# Patient Record
Sex: Male | Born: 1970 | Race: White | Hispanic: No | Smoking: Never smoker
Health system: Southern US, Community
[De-identification: ages and names within clinical notes are randomized; demographics above are authoritative.]

## PROBLEM LIST (undated history)

## (undated) DIAGNOSIS — I639 Cerebral infarction, unspecified: Secondary | ICD-10-CM

## (undated) DIAGNOSIS — E1142 Type 2 diabetes mellitus with diabetic polyneuropathy: Secondary | ICD-10-CM

## (undated) DIAGNOSIS — I10 Essential (primary) hypertension: Secondary | ICD-10-CM

## (undated) DIAGNOSIS — L97529 Non-pressure chronic ulcer of other part of left foot with unspecified severity: Secondary | ICD-10-CM

## (undated) DIAGNOSIS — M869 Osteomyelitis, unspecified: Secondary | ICD-10-CM

## (undated) DIAGNOSIS — I219 Acute myocardial infarction, unspecified: Secondary | ICD-10-CM

## (undated) DIAGNOSIS — I5043 Acute on chronic combined systolic (congestive) and diastolic (congestive) heart failure: Secondary | ICD-10-CM

## (undated) DIAGNOSIS — K3184 Gastroparesis: Secondary | ICD-10-CM

## (undated) DIAGNOSIS — T8781 Dehiscence of amputation stump: Secondary | ICD-10-CM

## (undated) DIAGNOSIS — Z9889 Other specified postprocedural states: Secondary | ICD-10-CM

## (undated) DIAGNOSIS — F418 Other specified anxiety disorders: Secondary | ICD-10-CM

## (undated) DIAGNOSIS — I253 Aneurysm of heart: Secondary | ICD-10-CM

## (undated) DIAGNOSIS — E559 Vitamin D deficiency, unspecified: Secondary | ICD-10-CM

## (undated) DIAGNOSIS — K219 Gastro-esophageal reflux disease without esophagitis: Secondary | ICD-10-CM

## (undated) DIAGNOSIS — M810 Age-related osteoporosis without current pathological fracture: Secondary | ICD-10-CM

## (undated) DIAGNOSIS — I2510 Atherosclerotic heart disease of native coronary artery without angina pectoris: Secondary | ICD-10-CM

## (undated) DIAGNOSIS — I739 Peripheral vascular disease, unspecified: Secondary | ICD-10-CM

## (undated) DIAGNOSIS — E11621 Type 2 diabetes mellitus with foot ulcer: Secondary | ICD-10-CM

## (undated) DIAGNOSIS — M86172 Other acute osteomyelitis, left ankle and foot: Secondary | ICD-10-CM

## (undated) DIAGNOSIS — G629 Polyneuropathy, unspecified: Secondary | ICD-10-CM

## (undated) DIAGNOSIS — S82141A Displaced bicondylar fracture of right tibia, initial encounter for closed fracture: Secondary | ICD-10-CM

## (undated) DIAGNOSIS — IMO0001 Reserved for inherently not codable concepts without codable children: Secondary | ICD-10-CM

## (undated) HISTORY — DX: Acute on chronic combined systolic (congestive) and diastolic (congestive) heart failure: I50.43

## (undated) HISTORY — DX: Atherosclerotic heart disease of native coronary artery without angina pectoris: I25.10

## (undated) HISTORY — PX: SHOULDER SURGERY: SHX246

## (undated) HISTORY — DX: Polyneuropathy, unspecified: G62.9

## (undated) HISTORY — PX: KNEE SURGERY: SHX244

## (undated) HISTORY — PX: EYE SURGERY: SHX253

## (undated) HISTORY — DX: Gastroparesis: K31.84

## (undated) HISTORY — DX: Other specified anxiety disorders: F41.8

## (undated) HISTORY — PX: CATARACT EXTRACTION: SUR2

---

## 2002-01-30 ENCOUNTER — Encounter: Admission: RE | Admit: 2002-01-30 | Discharge: 2002-01-30 | Payer: Self-pay | Admitting: Sports Medicine

## 2002-04-23 ENCOUNTER — Emergency Department (HOSPITAL_COMMUNITY): Admission: EM | Admit: 2002-04-23 | Discharge: 2002-04-23 | Payer: Self-pay | Admitting: Emergency Medicine

## 2003-05-07 ENCOUNTER — Encounter: Admission: RE | Admit: 2003-05-07 | Discharge: 2003-05-07 | Payer: Self-pay | Admitting: Family Medicine

## 2003-05-14 ENCOUNTER — Encounter: Admission: RE | Admit: 2003-05-14 | Discharge: 2003-05-14 | Payer: Self-pay | Admitting: Sports Medicine

## 2004-07-29 ENCOUNTER — Encounter (INDEPENDENT_AMBULATORY_CARE_PROVIDER_SITE_OTHER): Payer: Self-pay | Admitting: Internal Medicine

## 2004-07-29 LAB — CONVERTED CEMR LAB: Glucose, Bld: 11.7 mg/dL

## 2004-08-19 ENCOUNTER — Ambulatory Visit: Payer: Self-pay | Admitting: Internal Medicine

## 2004-09-15 ENCOUNTER — Ambulatory Visit: Payer: Self-pay | Admitting: *Deleted

## 2004-09-16 ENCOUNTER — Ambulatory Visit: Payer: Self-pay | Admitting: Internal Medicine

## 2004-10-22 ENCOUNTER — Ambulatory Visit: Payer: Self-pay | Admitting: Internal Medicine

## 2004-10-22 ENCOUNTER — Encounter (INDEPENDENT_AMBULATORY_CARE_PROVIDER_SITE_OTHER): Payer: Self-pay | Admitting: Internal Medicine

## 2004-10-22 LAB — CONVERTED CEMR LAB: Hgb A1c MFr Bld: 8.8 %

## 2006-05-31 ENCOUNTER — Emergency Department (HOSPITAL_COMMUNITY): Admission: EM | Admit: 2006-05-31 | Discharge: 2006-05-31 | Payer: Self-pay | Admitting: Family Medicine

## 2007-08-03 LAB — CONVERTED CEMR LAB
Glucose, Bld: 366 mg/dL
Hgb A1c MFr Bld: 14 %
Microalb, Ur: 2.43 mg/dL

## 2007-08-09 ENCOUNTER — Encounter (INDEPENDENT_AMBULATORY_CARE_PROVIDER_SITE_OTHER): Payer: Self-pay | Admitting: Internal Medicine

## 2007-08-09 DIAGNOSIS — E119 Type 2 diabetes mellitus without complications: Secondary | ICD-10-CM | POA: Insufficient documentation

## 2007-08-09 DIAGNOSIS — Z794 Long term (current) use of insulin: Secondary | ICD-10-CM

## 2007-08-09 DIAGNOSIS — E1143 Type 2 diabetes mellitus with diabetic autonomic (poly)neuropathy: Secondary | ICD-10-CM | POA: Insufficient documentation

## 2007-08-10 ENCOUNTER — Encounter (INDEPENDENT_AMBULATORY_CARE_PROVIDER_SITE_OTHER): Payer: Self-pay | Admitting: *Deleted

## 2007-08-10 ENCOUNTER — Ambulatory Visit: Payer: Self-pay | Admitting: Internal Medicine

## 2007-08-10 DIAGNOSIS — E785 Hyperlipidemia, unspecified: Secondary | ICD-10-CM | POA: Insufficient documentation

## 2007-08-10 LAB — CONVERTED CEMR LAB
ALT: 18 units/L (ref 0–53)
AST: 12 units/L (ref 0–37)
Albumin: 4.9 g/dL (ref 3.5–5.2)
Alkaline Phosphatase: 64 units/L (ref 39–117)
BUN: 9 mg/dL (ref 6–23)
Basophils Absolute: 0.1 10*3/uL (ref 0.0–0.1)
Basophils Relative: 1 % (ref 0–1)
CO2: 22 meq/L (ref 19–32)
Calcium: 9.5 mg/dL (ref 8.4–10.5)
Chloride: 98 meq/L (ref 96–112)
Cholesterol: 200 mg/dL (ref 0–200)
Creatinine, Ser: 0.59 mg/dL (ref 0.40–1.50)
Eosinophils Absolute: 0.1 10*3/uL (ref 0.0–0.7)
Eosinophils Relative: 1 % (ref 0–5)
Glucose, Bld: 304 mg/dL — ABNORMAL HIGH (ref 70–99)
HCT: 46.1 % (ref 39.0–52.0)
HDL: 29 mg/dL — ABNORMAL LOW (ref >39)
Hemoglobin: 16 g/dL (ref 13.0–17.0)
Lymphocytes Relative: 30 % (ref 12–46)
Lymphs Abs: 2.3 10*3/uL (ref 0.7–3.3)
MCHC: 34.7 g/dL (ref 30.0–36.0)
MCV: 87 fL (ref 78.0–100.0)
Microalb, Ur: 2.43 mg/dL — ABNORMAL HIGH (ref 0.00–1.89)
Monocytes Absolute: 0.5 10*3/uL (ref 0.2–0.7)
Monocytes Relative: 6 % (ref 3–11)
Neutro Abs: 4.9 10*3/uL (ref 1.7–7.7)
Neutrophils Relative %: 63 % (ref 43–77)
Platelets: 280 10*3/uL (ref 150–400)
Potassium: 4.2 meq/L (ref 3.5–5.3)
RBC: 5.3 M/uL (ref 4.22–5.81)
RDW: 13.4 % (ref 11.5–14.0)
Sodium: 135 meq/L (ref 135–145)
TSH: 1.524 microintl units/mL (ref 0.350–5.50)
Total Bilirubin: 1.8 mg/dL — ABNORMAL HIGH (ref 0.3–1.2)
Total CHOL/HDL Ratio: 6.9
Total Protein: 7.1 g/dL (ref 6.0–8.3)
Triglycerides: 558 mg/dL — ABNORMAL HIGH (ref <150)
WBC: 7.8 10*3/uL (ref 4.0–10.5)

## 2007-08-24 ENCOUNTER — Ambulatory Visit: Payer: Self-pay | Admitting: Internal Medicine

## 2007-08-24 DIAGNOSIS — G4700 Insomnia, unspecified: Secondary | ICD-10-CM | POA: Insufficient documentation

## 2007-08-24 LAB — CONVERTED CEMR LAB: Blood Glucose, Fingerstick: 262

## 2007-08-30 ENCOUNTER — Encounter (INDEPENDENT_AMBULATORY_CARE_PROVIDER_SITE_OTHER): Payer: Self-pay | Admitting: *Deleted

## 2008-07-08 ENCOUNTER — Telehealth (INDEPENDENT_AMBULATORY_CARE_PROVIDER_SITE_OTHER): Payer: Self-pay | Admitting: *Deleted

## 2008-07-19 ENCOUNTER — Ambulatory Visit: Payer: Self-pay | Admitting: Nurse Practitioner

## 2008-07-19 DIAGNOSIS — M542 Cervicalgia: Secondary | ICD-10-CM | POA: Insufficient documentation

## 2008-07-19 LAB — CONVERTED CEMR LAB
ALT: 19 units/L (ref 0–53)
AST: 14 units/L (ref 0–37)
Albumin: 4.8 g/dL (ref 3.5–5.2)
Alkaline Phosphatase: 75 units/L (ref 39–117)
BUN: 8 mg/dL (ref 6–23)
Basophils Absolute: 0.1 10*3/uL (ref 0.0–0.1)
Basophils Relative: 1 % (ref 0–1)
Blood Glucose, Fingerstick: 360
CO2: 22 meq/L (ref 19–32)
Calcium: 9.4 mg/dL (ref 8.4–10.5)
Chloride: 97 meq/L (ref 96–112)
Cholesterol: 194 mg/dL (ref 0–200)
Creatinine, Ser: 0.69 mg/dL (ref 0.40–1.50)
Eosinophils Absolute: 0.1 10*3/uL (ref 0.0–0.7)
Eosinophils Relative: 2 % (ref 0–5)
Glucose, Bld: 317 mg/dL — ABNORMAL HIGH (ref 70–99)
HCT: 46 % (ref 39.0–52.0)
HDL: 26 mg/dL — ABNORMAL LOW (ref >39)
Hemoglobin: 15.9 g/dL (ref 13.0–17.0)
Hgb A1c MFr Bld: 14 %
Lymphocytes Relative: 35 % (ref 12–46)
Lymphs Abs: 2.8 10*3/uL (ref 0.7–4.0)
MCHC: 34.6 g/dL (ref 30.0–36.0)
MCV: 88.1 fL (ref 78.0–100.0)
Monocytes Absolute: 0.5 10*3/uL (ref 0.1–1.0)
Monocytes Relative: 7 % (ref 3–12)
Neutro Abs: 4.5 10*3/uL (ref 1.7–7.7)
Neutrophils Relative %: 56 % (ref 43–77)
Platelets: 369 10*3/uL (ref 150–400)
Potassium: 3.9 meq/L (ref 3.5–5.3)
RBC: 5.22 M/uL (ref 4.22–5.81)
RDW: 13.2 % (ref 11.5–15.5)
Sodium: 135 meq/L (ref 135–145)
TSH: 2.694 microintl units/mL (ref 0.350–4.50)
Total Bilirubin: 1.1 mg/dL (ref 0.3–1.2)
Total CHOL/HDL Ratio: 7.5
Total Protein: 7.5 g/dL (ref 6.0–8.3)
Triglycerides: 632 mg/dL — ABNORMAL HIGH (ref <150)
WBC: 8 10*3/uL (ref 4.0–10.5)

## 2008-07-23 ENCOUNTER — Encounter (INDEPENDENT_AMBULATORY_CARE_PROVIDER_SITE_OTHER): Payer: Self-pay | Admitting: Nurse Practitioner

## 2008-07-25 ENCOUNTER — Encounter (INDEPENDENT_AMBULATORY_CARE_PROVIDER_SITE_OTHER): Payer: Self-pay | Admitting: Nurse Practitioner

## 2008-08-30 ENCOUNTER — Ambulatory Visit: Payer: Self-pay | Admitting: Nurse Practitioner

## 2008-08-30 DIAGNOSIS — E1142 Type 2 diabetes mellitus with diabetic polyneuropathy: Secondary | ICD-10-CM

## 2008-08-30 HISTORY — DX: Type 2 diabetes mellitus with diabetic polyneuropathy: E11.42

## 2008-08-30 LAB — CONVERTED CEMR LAB
Blood Glucose, Fingerstick: 290
Hgb A1c MFr Bld: 10.8 %

## 2008-12-11 ENCOUNTER — Ambulatory Visit: Payer: Self-pay | Admitting: Nurse Practitioner

## 2008-12-11 DIAGNOSIS — J309 Allergic rhinitis, unspecified: Secondary | ICD-10-CM | POA: Insufficient documentation

## 2008-12-11 LAB — CONVERTED CEMR LAB
Blood Glucose, Fingerstick: 377
Hgb A1c MFr Bld: 10.8 %

## 2008-12-12 ENCOUNTER — Encounter (INDEPENDENT_AMBULATORY_CARE_PROVIDER_SITE_OTHER): Payer: Self-pay | Admitting: Nurse Practitioner

## 2008-12-12 LAB — CONVERTED CEMR LAB
ALT: 16 units/L (ref 0–53)
AST: 13 units/L (ref 0–37)
Albumin: 4.7 g/dL (ref 3.5–5.2)
Alkaline Phosphatase: 65 units/L (ref 39–117)
Bilirubin, Direct: 0.2 mg/dL (ref 0.0–0.3)
Cholesterol: 181 mg/dL (ref 0–200)
HDL: 27 mg/dL — ABNORMAL LOW (ref >39)
Indirect Bilirubin: 1.5 mg/dL — ABNORMAL HIGH (ref 0.0–0.9)
Total Bilirubin: 1.7 mg/dL — ABNORMAL HIGH (ref 0.3–1.2)
Total Protein: 7.7 g/dL (ref 6.0–8.3)
Triglycerides: 478 mg/dL — ABNORMAL HIGH (ref <150)

## 2009-01-14 ENCOUNTER — Encounter (INDEPENDENT_AMBULATORY_CARE_PROVIDER_SITE_OTHER): Payer: Self-pay | Admitting: Nurse Practitioner

## 2009-01-15 ENCOUNTER — Encounter (INDEPENDENT_AMBULATORY_CARE_PROVIDER_SITE_OTHER): Payer: Self-pay | Admitting: Nurse Practitioner

## 2009-02-07 ENCOUNTER — Encounter (INDEPENDENT_AMBULATORY_CARE_PROVIDER_SITE_OTHER): Payer: Self-pay | Admitting: Nurse Practitioner

## 2009-02-27 ENCOUNTER — Telehealth (INDEPENDENT_AMBULATORY_CARE_PROVIDER_SITE_OTHER): Payer: Self-pay | Admitting: Nurse Practitioner

## 2009-05-14 ENCOUNTER — Ambulatory Visit: Payer: Self-pay | Admitting: Nurse Practitioner

## 2009-05-14 LAB — CONVERTED CEMR LAB
Blood Glucose, AC Bkfst: 249 mg/dL
Hgb A1c MFr Bld: 10.9 %

## 2009-05-15 LAB — CONVERTED CEMR LAB
ALT: 15 units/L (ref 0–53)
AST: 15 units/L (ref 0–37)
Albumin: 4.8 g/dL (ref 3.5–5.2)
Alkaline Phosphatase: 55 units/L (ref 39–117)
BUN: 10 mg/dL (ref 6–23)
CO2: 24 meq/L (ref 19–32)
Calcium: 9.4 mg/dL (ref 8.4–10.5)
Chloride: 100 meq/L (ref 96–112)
Cholesterol: 156 mg/dL (ref 0–200)
Creatinine, Ser: 0.63 mg/dL (ref 0.40–1.50)
Glucose, Bld: 241 mg/dL — ABNORMAL HIGH (ref 70–99)
HDL: 26 mg/dL — ABNORMAL LOW (ref >39)
LDL Cholesterol: 85 mg/dL (ref 0–99)
Potassium: 4.7 meq/L (ref 3.5–5.3)
Sodium: 136 meq/L (ref 135–145)
Total Bilirubin: 1.6 mg/dL — ABNORMAL HIGH (ref 0.3–1.2)
Total Protein: 7.6 g/dL (ref 6.0–8.3)
Triglycerides: 226 mg/dL — ABNORMAL HIGH (ref <150)
VLDL: 45 mg/dL — ABNORMAL HIGH (ref 0–40)

## 2009-05-21 DIAGNOSIS — M722 Plantar fascial fibromatosis: Secondary | ICD-10-CM | POA: Insufficient documentation

## 2009-07-11 ENCOUNTER — Ambulatory Visit: Payer: Self-pay | Admitting: Nurse Practitioner

## 2009-07-11 DIAGNOSIS — R059 Cough, unspecified: Secondary | ICD-10-CM | POA: Insufficient documentation

## 2009-07-11 DIAGNOSIS — N529 Male erectile dysfunction, unspecified: Secondary | ICD-10-CM | POA: Insufficient documentation

## 2009-07-11 DIAGNOSIS — R05 Cough: Secondary | ICD-10-CM | POA: Insufficient documentation

## 2009-07-11 LAB — CONVERTED CEMR LAB: Blood Glucose, Fingerstick: 298

## 2009-09-17 ENCOUNTER — Ambulatory Visit: Payer: Self-pay | Admitting: Nurse Practitioner

## 2009-09-17 LAB — CONVERTED CEMR LAB
BUN: 9 mg/dL (ref 6–23)
Bilirubin Urine: NEGATIVE
Blood Glucose, Fingerstick: 359
Blood in Urine, dipstick: NEGATIVE
CO2: 24 meq/L (ref 19–32)
Calcium: 9.6 mg/dL (ref 8.4–10.5)
Chloride: 94 meq/L — ABNORMAL LOW (ref 96–112)
Creatinine, Ser: 0.66 mg/dL (ref 0.40–1.50)
Glucose, Bld: 365 mg/dL — ABNORMAL HIGH (ref 70–99)
Hgb A1c MFr Bld: 10.8 %
Microalb, Ur: 1.7 mg/dL (ref 0.00–1.89)
Potassium: 3.9 meq/L (ref 3.5–5.3)
Protein, U semiquant: NEGATIVE
Rapid HIV Screen: NEGATIVE
Sodium: 134 meq/L — ABNORMAL LOW (ref 135–145)
Specific Gravity, Urine: 1.015
TSH: 2.329 microintl units/mL (ref 0.350–4.500)
Urobilinogen, UA: 0.2
WBC Urine, dipstick: NEGATIVE
pH: 5

## 2009-09-19 ENCOUNTER — Ambulatory Visit (HOSPITAL_COMMUNITY): Admission: RE | Admit: 2009-09-19 | Discharge: 2009-09-19 | Payer: Self-pay | Admitting: Internal Medicine

## 2012-03-14 ENCOUNTER — Encounter (HOSPITAL_COMMUNITY): Payer: Self-pay | Admitting: *Deleted

## 2012-03-14 ENCOUNTER — Emergency Department (INDEPENDENT_AMBULATORY_CARE_PROVIDER_SITE_OTHER)
Admission: EM | Admit: 2012-03-14 | Discharge: 2012-03-14 | Disposition: A | Discharge status: Home / Self Care | Payer: Self-pay | Source: Home / Self Care | Attending: Family Medicine | Admitting: Family Medicine

## 2012-03-14 DIAGNOSIS — H109 Unspecified conjunctivitis: Secondary | ICD-10-CM

## 2012-03-14 LAB — GLUCOSE, CAPILLARY: Glucose-Capillary: 442 mg/dL — ABNORMAL HIGH (ref 70–99)

## 2012-03-14 MED ORDER — POLYMYXIN B-TRIMETHOPRIM 10000-0.1 UNIT/ML-% OP SOLN: 1.0000 [drp] | OPHTHALMIC | Status: AC

## 2012-03-14 NOTE — Discharge Instructions (Signed)
Use eyedrops as directed. May also use normal saline prior to antibiotic drops to flush eyes. Exercise proper hygiene with handwashing, not touching the face or eye, not sharing towels or other clothing. Return to care should your symptoms not improve, or worsen in any way, or any visual disturbance.

## 2012-03-14 NOTE — ED Provider Notes (Signed)
History     CSN: [id number]  Arrival date & time 03/14/12  1252   First MD Initiated Contact with Patient 03/14/12 1450      Chief Complaint  Patient presents with  . Conjunctivitis    (Consider location/radiation/quality/duration/timing/severity/associated sxs/prior treatment) HPI Comments: Travis [name] presents for evaluation of persistent, redness, tearing, blurry vision, in his right eye. He reports onset of symptoms last Wednesday. He denies any specific injury, and does not work, around any degree. That day. He reports, that he had been using some eyedrops given to him by his father with mild improvement in his symptoms. However, over the last 3 days. Symptoms have worsened. He denies any loss of vision.  Patient is a 41 y.o. male presenting with conjunctivitis. The history is provided by the patient.  Conjunctivitis  The current episode started 5 to 7 days ago. The problem has been unchanged. The symptoms are relieved by one or more OTC medications. The symptoms are aggravated by nothing. Associated symptoms include photophobia, eye discharge and eye redness. Pertinent negatives include no decreased vision, no double vision, no eye itching, no congestion and no eye pain. There is pain in the right eye. The eye pain is not associated with movement. The eyelid exhibits redness.    Past Medical History  Diagnosis Date  . Diabetes mellitus     Past Surgical History  Procedure Date  . Knee surgery   . Shoulder surgery     No family history on file.  History  Substance Use Topics  . Smoking status: Never Smoker   . Smokeless tobacco: Not on file  . Alcohol Use: No      Review of Systems  Constitutional: Negative.   HENT: Negative.  Negative for congestion.   Eyes: Positive for photophobia, discharge and redness. Negative for double vision, pain and itching.  Respiratory: Negative.   Cardiovascular: Negative.   Gastrointestinal: Negative.   Genitourinary: Negative.     Musculoskeletal: Negative.   Skin: Negative.   Neurological: Negative.     Allergies  Review of patient's allergies indicates no known allergies.  Home Medications   Current Outpatient Rx  Name Route Sig Dispense Refill  . INSULIN ASPART 100 UNIT/ML [place] SOLN Subcutaneous Inject 10 Units into the skin 3 (three) times daily before meals.    . INSULIN DETEMIR 100 UNIT/ML [place] SOLN Subcutaneous Inject 60 Units into the skin 2 (two) times daily.    Marland Kitchen METFORMIN HCL ER (MOD) 1000 MG PO TB24 Oral Take 1,000 mg by mouth 2 (two) times daily.    Marland Kitchen POLYMYXIN B-TRIMETHOPRIM 10000-0.1 UNIT/ML-% OP SOLN Both Eyes Place 1 drop into both eyes every 4 (four) hours. 10 mL 0    BP 114/66  Pulse 87  Temp(Src) 97.6 F (36.4 C) (Oral)  Resp 14  SpO2 98%  Physical Exam  Nursing note and vitals reviewed. Constitutional: He is oriented to person, place, and time. He appears well-developed and well-nourished.  HENT:  Head: Normocephalic and atraumatic.  Eyes: EOM and lids are normal. Pupils are equal, round, and reactive to light. Right conjunctiva is injected. Right conjunctiva has no hemorrhage. Left conjunctiva is not injected. Left conjunctiva has no hemorrhage.  Neck: Normal range of motion.  Pulmonary/Chest: Effort normal.  Musculoskeletal: Normal range of motion.  Neurological: He is alert and oriented to person, place, and time.  Skin: Skin is warm and dry.  Psychiatric: His behavior is normal.    ED Course  Procedures (including critical care time)  Labs Reviewed - No data to display No results found.   1. Conjunctivitis       MDM  Likely viral conjunctivitis given length of time, but will cover with Polytrim eye drops; checked CBG, secondary to being out of insulin for more than one month; CBG: 442; he denies any symptoms and reports that this is his "normal" when he is out of his medicine; he states that he cannot afford his medicine, as Work[employer] Compensation was covering his  supplies but just recently cut it off; he states that his PCP at [facility] is working on it        Renaee Munda, MD 03/14/12 1609

## 2012-03-14 NOTE — ED Notes (Signed)
Pt states he started with right eye redness and irritation on Friday.  Used some of his dad's eye drops (not sure of name of med) and it got better.  Stopped the drops and the redness and irritation returned.  Pt has been out of his diabetes meds for over a month.

## 2012-05-22 ENCOUNTER — Encounter (HOSPITAL_COMMUNITY): Payer: Self-pay | Admitting: Emergency Medicine

## 2012-05-22 ENCOUNTER — Emergency Department (HOSPITAL_COMMUNITY)
Admission: EM | Admit: 2012-05-22 | Discharge: 2012-05-22 | Disposition: A | Discharge status: Home / Self Care | Payer: Self-pay | Attending: Emergency Medicine | Admitting: Emergency Medicine

## 2012-05-22 DIAGNOSIS — H209 Unspecified iridocyclitis: Secondary | ICD-10-CM | POA: Insufficient documentation

## 2012-05-22 DIAGNOSIS — E119 Type 2 diabetes mellitus without complications: Secondary | ICD-10-CM | POA: Insufficient documentation

## 2012-05-22 MED ORDER — OXYCODONE-ACETAMINOPHEN 5-325 MG PO TABS
1.0000 | ORAL_TABLET | Freq: Once | ORAL | Status: AC
  Administered 2012-05-22: 1 via ORAL
  Filled 2012-05-22: qty 1

## 2012-05-22 MED ORDER — CYCLOPENTOLATE HCL 1 % OP SOLN
2.0000 [drp] | Freq: Once | OPHTHALMIC | Status: AC
  Administered 2012-05-22: 2 [drp] via OPHTHALMIC
  Filled 2012-05-22: qty 2

## 2012-05-22 MED ORDER — OXYCODONE-ACETAMINOPHEN 5-325 MG PO TABS: 2.0000 | ORAL_TABLET | ORAL | Status: AC | PRN

## 2012-05-22 NOTE — ED Provider Notes (Signed)
History     CSN: [id number]  Arrival date & time 05/22/12  1701   First MD Initiated Contact with Patient 05/22/12 1748      Chief Complaint  Patient presents with  . Eye Injury    (Consider location/radiation/quality/duration/timing/severity/associated sxs/prior treatment) HPI  Past Medical History  Diagnosis Date  . Diabetes mellitus     Past Surgical History  Procedure Date  . Knee surgery   . Shoulder surgery     History reviewed. No pertinent family history.  History  Substance Use Topics  . Smoking status: Never Smoker   . Smokeless tobacco: Not on file  . Alcohol Use: No      Review of Systems  All other systems reviewed and are negative.    Allergies  Review of patient's allergies indicates no known allergies.  Home Medications   Current Outpatient Rx  Name Route Sig Dispense Refill  . OXYCODONE-ACETAMINOPHEN 5-325 MG PO TABS Oral Take 2 tablets by mouth every 4 (four) hours as needed for pain. 6 tablet 0    BP 132/80  Pulse 90  Temp(Src) 98 F (36.7 C) (Oral)  Resp 14  SpO2 99%  Physical Exam  Nursing note and vitals reviewed. Constitutional: He is oriented to person, place, and time. He appears well-developed and well-nourished. No distress.  HENT:  Head: Normocephalic and atraumatic.  Eyes: No foreign bodies found. Left eye exhibits no exudate. No foreign body present in the left eye. Left conjunctiva is injected. Left eye exhibits normal extraocular motion and no nystagmus. Left pupil is not round.  Slit lamp exam:      The left eye shows corneal flare. The left eye shows no corneal abrasion, no corneal ulcer, no foreign body, no hyphema, no hypopyon, no fluorescein uptake and no anterior chamber bulge.  Neck: Normal range of motion.  Cardiovascular: Normal rate and intact distal pulses.   Pulmonary/Chest: No respiratory distress.  Abdominal: Normal appearance. He exhibits no distension.  Musculoskeletal: Normal range of motion.    Neurological: He is alert and oriented to person, place, and time. No cranial nerve deficit.  Skin: Skin is warm and dry. No rash noted.  Psychiatric: He has a normal mood and affect. His behavior is normal.    ED Course  Procedures (including critical care time) IOP was 12 in L eye Labs Reviewed - No data to display No results found.   1. Iritis       MDM          Nelia Shi, MD 05/22/12 2039

## 2012-05-22 NOTE — ED Notes (Addendum)
Pt discharged.Discharge instruction given to pt.

## 2012-05-22 NOTE — ED Notes (Signed)
Pt c/o left eye redness and pain x 1 week; pt sts hx of similar in right eye recently; pt denies obvious injury; eye is noted to be red and light sensitive

## 2012-05-22 NOTE — ED Notes (Signed)
Pt is alert male with him

## 2012-05-22 NOTE — Discharge Instructions (Signed)
Iritis Iritis is an inflammation of the colored part of the eye (iris). Other parts at the front of the eye may also be inflamed. The iris is part of the middle layer of the eyeball which is called the uvea or the uveal track. Any part of the uveal track can become inflamed. The other portions of the uveal track are the choroid (the thin membrane under the outer layer of the eye), and the ciliary body (joins the choroid and the iris and produces the fluid in the front of the eye).  It is extremely important to treat iritis early, as it may lead to internal eye damage causing scarring or diseases such as glaucoma. Some people have only one attack of iritis (in one or both eyes) in their lifetime, while others may get it many times. CAUSES Iritis can be associated with many different diseases, but mostly occurs in otherwise healthy people. Examples of diseases that can be associated with iritis include:  Diseases where the body's immune system attacks tissues within your own body (autoimmune diseases).   Infections (tuberculosis, gonorrhea, fungus infections, Lyme disease, infection of the lining of the heart).   Trauma or injury.   Eye diseases (acute glaucoma and others).   Inflammation from other parts of the uveal track.   Severe eye infections.   Other rare diseases.  SYMPTOMS  Eye pain or aching.   Sensitivity to light.   Loss of sight or blurred vision.   Redness of the eye. This is often accompanied by a ring of redness around the outside of the cornea, or clear covering at the front of the eye (ciliary flush).   Excessive tearing of the eye(s).   A small pupil that does not enlarge in the dark and stays smaller than the other eye's pupil.   A whitish area that obscures the lower part of the colored circular iris. Sometimes this is visible when looking at the eye, where the whitish area has a "fluid level" or flat top. This is called a "hypopyon" and is actually pus inside the  eye.  Since iritis causes the eye to become red, it is often confused with a much less dangerous form of "pink eye" or conjunctivitis. One of the most important symptoms is sensitivity to light. Anytime there is redness, discomfort in the eye(s) and extreme light sensitivity, it is extremely important to see an ophthalmologist as soon as possible. TREATMENT Acute iritis requires prompt medical evaluation by an eye specialist (ophthalmologist.) Treatment depends on the underlying cause but may include:  Corticosteroid eye drops and dilating eye drops. Follow your caregiver's exact instructions on taking and stopping corticosteroid medications (drops or pills).   Occasionally, the iritis will be so severe that it will not respond to commonly used medications. If this happens, it may be necessary to use steroid injections. The injections are given under the eye's outer surface. Sometimes oral medications are given. The decision on treatment used for iritis is usually made on an individual basis.  HOME CARE INSTRUCTIONS Your care giver will give specific instructions regarding the use of eye medications or other medications. Be certain to follow all instructions in both taking and stopping the medications. SEEK IMMEDIATE MEDICAL CARE IF:  You have redness of one or both eye.   You experience a great deal of light sensitivity.   You have pain or aching in either eye.  MAKE SURE YOU:   Understand these instructions.   Will watch your condition.     Will get help right away if you are not doing well or get worse.  Document Released: 11/29/2005 Document Revised: 11/18/2011 Document Reviewed: 05/19/2007 Exit[employer] Patient Information 2012 [employer]. 

## 2012-05-22 NOTE — ED Notes (Signed)
Paged Dr. Geralynn Ochs to [phone]

## 2013-05-25 ENCOUNTER — Encounter (HOSPITAL_COMMUNITY): Payer: Self-pay

## 2013-05-25 ENCOUNTER — Emergency Department (HOSPITAL_COMMUNITY)
Admission: EM | Admit: 2013-05-25 | Discharge: 2013-05-25 | Disposition: A | Discharge status: Home / Self Care | Payer: Self-pay | Attending: Emergency Medicine | Admitting: Emergency Medicine

## 2013-05-25 DIAGNOSIS — H539 Unspecified visual disturbance: Secondary | ICD-10-CM | POA: Insufficient documentation

## 2013-05-25 DIAGNOSIS — H5789 Other specified disorders of eye and adnexa: Secondary | ICD-10-CM | POA: Insufficient documentation

## 2013-05-25 DIAGNOSIS — Z79899 Other long term (current) drug therapy: Secondary | ICD-10-CM | POA: Insufficient documentation

## 2013-05-25 DIAGNOSIS — H5711 Ocular pain, right eye: Secondary | ICD-10-CM

## 2013-05-25 DIAGNOSIS — H53149 Visual discomfort, unspecified: Secondary | ICD-10-CM | POA: Insufficient documentation

## 2013-05-25 DIAGNOSIS — E119 Type 2 diabetes mellitus without complications: Secondary | ICD-10-CM | POA: Insufficient documentation

## 2013-05-25 DIAGNOSIS — H5710 Ocular pain, unspecified eye: Secondary | ICD-10-CM | POA: Insufficient documentation

## 2013-05-25 MED ORDER — TETRACAINE HCL 0.5 % OP SOLN
1.0000 [drp] | Freq: Once | OPHTHALMIC | Status: AC
  Administered 2013-05-25: 1 [drp] via OPHTHALMIC
  Filled 2013-05-25: qty 2

## 2013-05-25 NOTE — ED Provider Notes (Signed)
History     CSN: [id number]  Arrival date & time 05/25/13  1210   First MD Initiated Contact with Patient 05/25/13 1245      No chief complaint on file.   (Consider location/radiation/quality/duration/timing/severity/associated sxs/prior treatment) HPI Comments: Patient presents to the ED for right eye redness and visual disturbance.  States he's been having this problem intermittently for the past year but has not seen an eye doctor.  Pt does wear glasses when reading.  Patient states last night he experienced a sudden loss of vision which has never happened with prior episodes. Today in the ED he states vision has improved somewhat but he feels like there is a film over his right eye and everything in his right visual field appears white.  Denies any injury or trauma to the eye.  No chemical or FB exposure.  Pt wearing right eye patch on arrival.  The history is provided by the patient.    Past Medical History  Diagnosis Date  . Diabetes mellitus     Past Surgical History  Procedure Laterality Date  . Knee surgery    . Shoulder surgery      History reviewed. No pertinent family history.  History  Substance Use Topics  . Smoking status: Never Smoker   . Smokeless tobacco: Not on file  . Alcohol Use: No      Review of Systems  Eyes: Positive for photophobia, pain, redness and visual disturbance.  All other systems reviewed and are negative.    Allergies  Review of patient's allergies indicates no known allergies.  Home Medications   Current Outpatient Rx  Name  Route  Sig  Dispense  Refill  . beta carotene w/minerals (OCUVITE) tablet   Oral   Take 1 tablet by mouth daily.           BP 123/82  Pulse 93  Temp(Src) 98.2 F (36.8 C) (Oral)  Resp 18  SpO2 98%  Physical Exam  Nursing note and vitals reviewed. Constitutional: He is oriented to person, place, and time. He appears well-developed and well-nourished.  HENT:  Head: Normocephalic and  atraumatic.  Mouth/Throat: Oropharynx is clear and moist.  Eyes: EOM and lids are normal. Pupils are equal, round, and reactive to light. Right conjunctiva is injected. Right conjunctiva has no hemorrhage. Right eye exhibits normal extraocular motion and no nystagmus.  Right eye injected without haziness, FB, drainage, or lid edema, EOM intact and non-painful  Neck: Normal range of motion. Neck supple.  Cardiovascular: Normal rate, regular rhythm and normal heart sounds.   Pulmonary/Chest: Effort normal and breath sounds normal.  Abdominal: Soft. Bowel sounds are normal.  Musculoskeletal: Normal range of motion.  Neurological: He is alert and oriented to person, place, and time.  Skin: Skin is warm and dry.  Psychiatric: He has a normal mood and affect.    ED Course  Procedures (including critical care time)  Labs Reviewed - No data to display No results found.   1. Acute right eye pain   2. Redness of eye, right   3. Visual disturbance       MDM   Visual acuity L 20/30, unable to visual chart with right eye.  Attempted to obtain IOP and examine eye with slit lamp but pt could not tolerate either evaluation.  Consulted opthalmology, Dr. Charlotte Sanes- instructed to have pt report directly to his office for further evaluation.  Discussed plan with pt and wife, they agreed.  Garlon Hatchet, PA-C 05/25/13  1651 

## 2013-05-25 NOTE — ED Notes (Signed)
Pt presents with 1 year h/o eye difficulties.  Pt reports initially was to L eye, describes as "really sore" with photophobia.  Symptoms cleared, reports last 3 episodes have been to R eye.  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  Pt reports loss of vision last night, seeing initially a film covering his eye and reports now, his entire visual field is white.

## 2013-05-26 NOTE — ED Provider Notes (Signed)
Medical screening examination/treatment/procedure(s) were performed by non-physician practitioner and as supervising physician I was immediately available for consultation/collaboration.   Zanya Lindo W Florella Mcneese, MD 05/26/13 0807 

## 2014-03-18 ENCOUNTER — Ambulatory Visit (INDEPENDENT_AMBULATORY_CARE_PROVIDER_SITE_OTHER): Payer: BC Managed Care – PPO | Admitting: Family Medicine

## 2014-03-18 ENCOUNTER — Encounter: Payer: Self-pay | Admitting: Family Medicine

## 2014-03-18 VITALS — BP 104/66 | HR 68 | Temp 97.3°F | Resp 14 | Ht 74.0 in | Wt 225.0 lb

## 2014-03-18 DIAGNOSIS — G629 Polyneuropathy, unspecified: Secondary | ICD-10-CM | POA: Insufficient documentation

## 2014-03-18 DIAGNOSIS — E1149 Type 2 diabetes mellitus with other diabetic neurological complication: Secondary | ICD-10-CM

## 2014-03-18 MED ORDER — ONETOUCH ULTRA SYSTEM W/DEVICE KIT: 1.0000 | PACK | Freq: Once | Status: DC

## 2014-03-18 NOTE — Progress Notes (Signed)
   Subjective:    Patient ID: Travis [name], male    DOB: 09/18/1971, 43 y.o.   MRN: [id number]  HPI Patient is a 43 year old white male who is a previous patient of mine who have not seen in several years. He has a history of poorly controlled/uncontrolled type 2 diabetes mellitus. Last hemoglobin A1c I have on record was 11 in 2010. The patient is not checking his sugars at all. He states that over a year ago her sugars are running in the 400s when he was not on medication. In the past he has been on 70 units of Lantus and 10 units of NovoLog 3 times a day. On that dose he was typically in the 200s although still not well controlled. He has severe peripheral neuropathy in both feet as well as both arms. He has essentially no sensation in the plantar aspects of both feet. He also has a history of diabetic retinopathy. He has seen an eye specialist this year. He is here today to reestablish care. Past Medical History  Diagnosis Date  . Diabetes mellitus   . Peripheral neuropathy    No current outpatient prescriptions on file prior to visit.   No current facility-administered medications on file prior to visit.   No Known Allergies History   Social History  . Marital Status: Legally Separated    Spouse Name: N/A    Number of Children: N/A  . Years of Education: N/A   Occupational History  . Not on file.   Social History Main Topics  . Smoking status: Never Smoker   . Smokeless tobacco: Never Used  . Alcohol Use: No  . Drug Use: No  . Sexual Activity: Yes   Other Topics Concern  . Not on file   Social History Narrative  . No narrative on file      Review of Systems  All other systems reviewed and are negative.       Objective:   Physical Exam  Vitals reviewed. Neck: Neck supple. No thyromegaly present.  Cardiovascular: Normal rate, regular rhythm, normal heart sounds and intact distal pulses.  Exam reveals no gallop and no friction rub.   No murmur  heard. Pulmonary/Chest: Effort normal and breath sounds normal. No respiratory distress. He has no wheezes. He has no rales. He exhibits no tenderness.  Abdominal: Soft. Bowel sounds are normal. He exhibits no distension. There is no tenderness. There is no rebound and no guarding.  Musculoskeletal: He exhibits no edema.  Lymphadenopathy:    He has no cervical adenopathy.          Assessment & Plan:  1. Type II or unspecified type diabetes mellitus with neurological manifestations, not stated as uncontrolled Check a hemoglobin A1c as well as a urine microalbumin. Based on this I can come up with an estimate of how much insulin the patient needs. I anticipate start the patient on Lantus 60 units subcutaneous a.m. and 10 units of NovoLog with meals. Then recheck his fasting blood sugar and two-hour postprandial sugars in one week to further titrate his medication. The patient has severe peripheral neuropathy in his feet. We discussed measures to prevent ulcers and infections in the feet. I recommended annual followup with his eye specialist. Are the results of his lab work before determining the next course of treatment. - COMPLETE METABOLIC PANEL WITH GFR - Hemoglobin A1c - Microalbumin, urine

## 2014-03-19 LAB — COMPLETE METABOLIC PANEL WITHOUT GFR
ALT: 16 U/L (ref 0–53)
AST: 14 U/L (ref 0–37)
Albumin: 4.4 g/dL (ref 3.5–5.2)
Alkaline Phosphatase: 73 U/L (ref 39–117)
BUN: 6 mg/dL (ref 6–23)
CO2: 27 meq/L (ref 19–32)
Calcium: 8.8 mg/dL (ref 8.4–10.5)
Chloride: 96 meq/L (ref 96–112)
Creat: 0.54 mg/dL (ref 0.50–1.35)
GFR, Est African American: >89 mL/min
GFR, Est Non African American: >89 mL/min
Glucose, Bld: 349 mg/dL — ABNORMAL HIGH (ref 70–99)
Potassium: 4.2 meq/L (ref 3.5–5.3)
Sodium: 132 meq/L — ABNORMAL LOW (ref 135–145)
Total Bilirubin: 1 mg/dL (ref 0.2–1.2)
Total Protein: 6.8 g/dL (ref 6.0–8.3)

## 2014-03-19 LAB — HEMOGLOBIN A1C
Hgb A1c MFr Bld: 11.6 % — ABNORMAL HIGH
Mean Plasma Glucose: 286 mg/dL — ABNORMAL HIGH

## 2014-03-19 LAB — MICROALBUMIN, URINE: Microalb, Ur: 0.5 mg/dL (ref 0.00–1.89)

## 2014-03-21 ENCOUNTER — Other Ambulatory Visit: Payer: Self-pay | Admitting: *Deleted

## 2014-03-21 MED ORDER — INSULIN ASPART 100 UNIT/ML ~~LOC~~ SOLN: 7.0000 [IU] | Freq: Every evening | SUBCUTANEOUS | Status: DC

## 2014-03-21 MED ORDER — INSULIN GLARGINE 100 UNIT/ML ~~LOC~~ SOLN: 45.0000 [IU] | Freq: Every morning | SUBCUTANEOUS | Status: DC

## 2014-03-21 NOTE — Telephone Encounter (Signed)
Per orders noted on labs, medication sent to pharmacy.   Call placed to patient and patient made aware. 

## 2014-03-29 ENCOUNTER — Encounter: Payer: Self-pay | Admitting: Family Medicine

## 2014-04-08 ENCOUNTER — Telehealth: Payer: Self-pay | Admitting: Family Medicine

## 2014-04-08 NOTE — Telephone Encounter (Signed)
Message copied by Ricard DillonWILLIS, SANDY B on Mon Apr 08, 2014  3:16 PM ------      Message from: Harriet MassonOBERTS, CHELSEA N      Created: Mon Apr 08, 2014  2:06 PM      Regarding: sugar levels      Contact: [phone]       Sugar levels            Morning fast getting better        [phone] is what it is ranging from            Hour after dinner      [phone] ------

## 2014-04-09 NOTE — Telephone Encounter (Signed)
Increase lantus with 60 units day and recheck levels in 1 week.

## 2014-04-11 NOTE — Telephone Encounter (Signed)
Patient aware and will call back with results in a week

## 2014-04-11 NOTE — Telephone Encounter (Signed)
LMTRC

## 2014-04-29 ENCOUNTER — Ambulatory Visit: Payer: BC Managed Care – PPO | Admitting: Family Medicine

## 2014-04-29 ENCOUNTER — Telehealth: Payer: Self-pay | Admitting: Family Medicine

## 2014-04-29 NOTE — Telephone Encounter (Signed)
Message copied by Ricard DillonWILLIS, SANDY B on Mon Apr 29, 2014  3:15 PM ------      Message from: Lynnea FerrierPICKARD, WARREN      Created: Fri Apr 26, 2014  7:51 AM       Increase lantus to 75 units daily and continue novolog 10 with meals.  Recheck in 1 week.      ----- Message -----         From: Ricard DillonSandy B Willis         Sent: 04/25/2014   3:45 PM           To: Donita BrooksWarren T Pickard, MD                        ----- Message -----         From: Malvin JohnsSusan S Bullins         Sent: 04/25/2014   3:05 PM           To: Ricard DillonSandy B Willis            Patient is calling to give you his blood sugar readings       286 down to 225 fasting.      After dinner [phone]       ------

## 2014-04-29 NOTE — Telephone Encounter (Signed)
Pt aware and is only on 7 units of novolog - per WTP continue that dose and increase just the lantus to 75 units and call in 1 week with BS.

## 2014-04-30 ENCOUNTER — Telehealth: Payer: Self-pay | Admitting: Family Medicine

## 2014-04-30 MED ORDER — INSULIN GLARGINE 100 UNIT/ML ~~LOC~~ SOLN: 75.0000 [IU] | Freq: Every morning | SUBCUTANEOUS | Status: DC

## 2014-04-30 NOTE — Telephone Encounter (Signed)
Med sent to pharm 

## 2014-04-30 NOTE — Telephone Encounter (Signed)
[phone] Pt is needing a refill on  insulin glargine (LANTUS) 100 UNIT/ML injection With new instructions  [facility] [place]

## 2014-05-03 ENCOUNTER — Encounter: Payer: Self-pay | Admitting: Family Medicine

## 2014-05-03 ENCOUNTER — Ambulatory Visit (INDEPENDENT_AMBULATORY_CARE_PROVIDER_SITE_OTHER): Payer: BC Managed Care – PPO | Admitting: Family Medicine

## 2014-05-03 VITALS — BP 100/60 | HR 74 | Temp 97.7°F | Resp 14 | Ht 74.0 in | Wt 238.0 lb

## 2014-05-03 DIAGNOSIS — R5383 Other fatigue: Secondary | ICD-10-CM

## 2014-05-03 DIAGNOSIS — E1142 Type 2 diabetes mellitus with diabetic polyneuropathy: Secondary | ICD-10-CM

## 2014-05-03 DIAGNOSIS — E1149 Type 2 diabetes mellitus with other diabetic neurological complication: Secondary | ICD-10-CM

## 2014-05-03 DIAGNOSIS — R5381 Other malaise: Secondary | ICD-10-CM

## 2014-05-03 DIAGNOSIS — E1140 Type 2 diabetes mellitus with diabetic neuropathy, unspecified: Secondary | ICD-10-CM

## 2014-05-03 LAB — COMPLETE METABOLIC PANEL WITH GFR
ALT: 15 U/L (ref 0–53)
AST: 13 U/L (ref 0–37)
Albumin: 4.3 g/dL (ref 3.5–5.2)
Alkaline Phosphatase: 61 U/L (ref 39–117)
BUN: 7 mg/dL (ref 6–23)
CO2: 25 mEq/L (ref 19–32)
CREATININE: 0.6 mg/dL (ref 0.50–1.35)
Calcium: 9.2 mg/dL (ref 8.4–10.5)
Chloride: 100 mEq/L (ref 96–112)
GFR, Est African American: >89 mL/min
GFR, Est Non African American: >89 mL/min
Glucose, Bld: 285 mg/dL — ABNORMAL HIGH (ref 70–99)
POTASSIUM: 4.7 meq/L (ref 3.5–5.3)
SODIUM: 134 meq/L — AB (ref 135–145)
Total Bilirubin: 0.9 mg/dL (ref 0.2–1.2)
Total Protein: 6.5 g/dL (ref 6.0–8.3)

## 2014-05-03 LAB — VITAMIN B12: Vitamin B-12: 312 pg/mL (ref 211–911)

## 2014-05-03 LAB — TSH: TSH: 2.757 u[IU]/mL (ref 0.350–4.500)

## 2014-05-03 LAB — TESTOSTERONE: Testosterone: 306 ng/dL (ref 300–890)

## 2014-05-03 MED ORDER — DULOXETINE HCL 30 MG PO CPEP: 60.0000 mg | ORAL_CAPSULE | Freq: Every day | ORAL | Status: DC

## 2014-05-03 NOTE — Progress Notes (Signed)
Subjective:    Patient ID: Travis [name], male    DOB: [birth year], 43 y.o.   MRN: [id number]  HPI  Patient is currently on 75 units of Lantus subcutaneous daily and 7 units of NovoLog with supper. He reports blood sugars between 202 150. He is also having severe neuropathy in his feet on a daily basis. He describes it as a pins and needles sensation. He is also concerned because he is extremely fatigued and tired. He has no motivation exercise. He has noticed muscle wasting in the proximal muscles of his legs and arms. He has noticed decreased strength. He is concerned he may have testosterone deficiency. At the present time he is not getting any regular aerobic exercise. He is also still drinking sodas and eating high carbohydrate. Past Medical History  Diagnosis Date  . Diabetes mellitus   . Peripheral neuropathy    Current Outpatient Prescriptions on File Prior to Visit  Medication Sig Dispense Refill  . Blood Glucose Monitoring Suppl (ONE TOUCH ULTRA SYSTEM KIT) W/DEVICE KIT 1 kit by Does not apply route once. Needs strips Disp: 100/11 refills and lancets Disp: 100/5 refills  Pt checks BS QAM.  1 each  0  . glucose blood test strip 1 each by Other route as needed for other. One Touch Ultra - Checks BS QAM      . insulin glargine (LANTUS) 100 UNIT/ML injection Inject 0.75 mLs (75 Units total) into the skin every morning.  20 mL  3   No current facility-administered medications on file prior to visit.   No Known Allergies History   Social History  . Marital Status: Legally Separated    Spouse Name: N/A    Number of Children: N/A  . Years of Education: N/A   Occupational History  . Not on file.   Social History Main Topics  . Smoking status: Never Smoker   . Smokeless tobacco: Never Used  . Alcohol Use: No  . Drug Use: No  . Sexual Activity: Yes   Other Topics Concern  . Not on file   Social History Narrative  . No narrative on file   History reviewed. No pertinent  family history.   Review of Systems  All other systems reviewed and are negative.      Objective:   Physical Exam  Vitals reviewed. Constitutional: He appears well-developed and well-nourished.  Cardiovascular: Normal rate, regular rhythm and normal heart sounds.   Pulmonary/Chest: Effort normal and breath sounds normal. No respiratory distress. He has no wheezes. He has no rales.  Abdominal: Soft. Bowel sounds are normal. He exhibits no distension. There is no tenderness. There is no rebound and no guarding.  Neurological: He is alert. He has normal reflexes.          Assessment & Plan:  1. Diabetic neuropathy Begin Cymbalta 30 mg by mouth daily for one-week and then gradually increase to 60 mg by mouth daily thereafter. I also explained that we need to increase his rising and heart were to prevent worsening of the neuropathy. I discussed with the patient to begin 30 minutes aerobic exercise 5 days a week. This should reduce his sugar readings. I also recommended that he drastically reduced carbohydrates in his diet. I recommended left and 45 g of carbohydrates per meal. I explained to the patient that one typical ca of soda has greater than 50 g of carbs in it. He can stop drinking sodas it would dramatically improve his blood sugar.  Recheck sugars in 1 week. - DULoxetine (CYMBALTA) 30 MG capsule; Take 2 capsules (60 mg total) by mouth daily.  Dispense: 60 capsule; Refill: 3 - Vitamin B12  2. Other malaise and fatigue - COMPLETE METABOLIC PANEL WITH GFR - TSH - Testosterone - Vitamin B12

## 2014-05-08 ENCOUNTER — Telehealth: Payer: Self-pay | Admitting: Family Medicine

## 2014-05-08 DIAGNOSIS — G4710 Hypersomnia, unspecified: Secondary | ICD-10-CM

## 2014-05-08 NOTE — Telephone Encounter (Signed)
Pt states that you were going to discuss his low BP but at the ov you and him got to discussing something else and nothing was mentioned about it. He is still feeling sluggish and tired.

## 2014-05-13 NOTE — Telephone Encounter (Signed)
Pt would like to get a sleep study done as he is having problems waking up at night trying to catch his breath. Will send in order.

## 2014-05-13 NOTE — Telephone Encounter (Signed)
I do not feel his bp is the cause of his sluggishness and fatigue.  Other potential causes include sleep apnea, he should consider a sleep study.  If he is having sob or chest pain, I would recommend cardiology eval.

## 2014-06-22 ENCOUNTER — Other Ambulatory Visit: Payer: Self-pay | Admitting: *Deleted

## 2014-06-22 ENCOUNTER — Telehealth: Payer: Self-pay | Admitting: Family Medicine

## 2014-06-22 DIAGNOSIS — E1141 Type 2 diabetes mellitus with diabetic mononeuropathy: Secondary | ICD-10-CM

## 2014-06-22 DIAGNOSIS — E785 Hyperlipidemia, unspecified: Secondary | ICD-10-CM

## 2014-06-22 DIAGNOSIS — E1149 Type 2 diabetes mellitus with other diabetic neurological complication: Secondary | ICD-10-CM

## 2014-06-22 NOTE — Telephone Encounter (Signed)
Several calls have been made to patient to schedule a sleep evaluation.  Pt has not responded to phone calls nor messages left requesting a call back.  Will close this referral until otherwise notified.

## 2014-09-02 ENCOUNTER — Ambulatory Visit: Payer: BC Managed Care – PPO | Admitting: Family Medicine

## 2015-09-05 ENCOUNTER — Ambulatory Visit: Payer: Self-pay | Attending: Internal Medicine

## 2015-11-03 ENCOUNTER — Encounter: Payer: Self-pay | Admitting: Family Medicine

## 2015-11-03 ENCOUNTER — Ambulatory Visit (INDEPENDENT_AMBULATORY_CARE_PROVIDER_SITE_OTHER): Payer: No Typology Code available for payment source | Admitting: Family Medicine

## 2015-11-03 VITALS — BP 121/70 | HR 83 | Temp 98.3°F | Resp 14 | Ht 74.0 in | Wt 197.0 lb

## 2015-11-03 DIAGNOSIS — K219 Gastro-esophageal reflux disease without esophagitis: Secondary | ICD-10-CM

## 2015-11-03 DIAGNOSIS — E119 Type 2 diabetes mellitus without complications: Secondary | ICD-10-CM | POA: Insufficient documentation

## 2015-11-03 DIAGNOSIS — E1142 Type 2 diabetes mellitus with diabetic polyneuropathy: Secondary | ICD-10-CM

## 2015-11-03 DIAGNOSIS — H539 Unspecified visual disturbance: Secondary | ICD-10-CM

## 2015-11-03 DIAGNOSIS — G629 Polyneuropathy, unspecified: Secondary | ICD-10-CM

## 2015-11-03 DIAGNOSIS — Z794 Long term (current) use of insulin: Secondary | ICD-10-CM

## 2015-11-03 LAB — CBC WITH DIFFERENTIAL/PLATELET
BASOS PCT: 0 % (ref 0–1)
Basophils Absolute: 0 10*3/uL (ref 0.0–0.1)
Eosinophils Absolute: 0.2 10*3/uL (ref 0.0–0.7)
Eosinophils Relative: 2 % (ref 0–5)
HCT: 42 % (ref 39.0–52.0)
HEMOGLOBIN: 14.1 g/dL (ref 13.0–17.0)
Lymphocytes Relative: 22 % (ref 12–46)
Lymphs Abs: 2 10*3/uL (ref 0.7–4.0)
MCH: 29.7 pg (ref 26.0–34.0)
MCHC: 33.6 g/dL (ref 30.0–36.0)
MCV: 88.6 fL (ref 78.0–100.0)
MONOS PCT: 6 % (ref 3–12)
MPV: 10.5 fL (ref 8.6–12.4)
Monocytes Absolute: 0.6 10*3/uL (ref 0.1–1.0)
NEUTROS ABS: 6.5 10*3/uL (ref 1.7–7.7)
NEUTROS PCT: 70 % (ref 43–77)
Platelets: 322 10*3/uL (ref 150–400)
RBC: 4.74 MIL/uL (ref 4.22–5.81)
RDW: 13 % (ref 11.5–15.5)
WBC: 9.3 10*3/uL (ref 4.0–10.5)

## 2015-11-03 LAB — COMPLETE METABOLIC PANEL WITH GFR
ALBUMIN: 4 g/dL (ref 3.6–5.1)
ALK PHOS: 85 U/L (ref 40–115)
ALT: 18 U/L (ref 9–46)
AST: 16 U/L (ref 10–40)
BILIRUBIN TOTAL: 1.2 mg/dL (ref 0.2–1.2)
BUN: 9 mg/dL (ref 7–25)
CALCIUM: 9.1 mg/dL (ref 8.6–10.3)
CO2: 26 mmol/L (ref 20–31)
CREATININE: 0.48 mg/dL — AB (ref 0.60–1.35)
Chloride: 97 mmol/L — ABNORMAL LOW (ref 98–110)
GFR, Est Non African American: >89 mL/min (ref >=60)
Glucose, Bld: 357 mg/dL — ABNORMAL HIGH (ref 65–99)
Potassium: 4.3 mmol/L (ref 3.5–5.3)
Sodium: 134 mmol/L — ABNORMAL LOW (ref 135–146)
TOTAL PROTEIN: 6.9 g/dL (ref 6.1–8.1)

## 2015-11-03 LAB — POCT URINALYSIS DIP (DEVICE)
Bilirubin Urine: NEGATIVE
Glucose, UA: 500 mg/dL — AB
Hgb urine dipstick: NEGATIVE
KETONES UR: NEGATIVE mg/dL
Leukocytes, UA: NEGATIVE
NITRITE: NEGATIVE
PH: 5 (ref 5.0–8.0)
Protein, ur: NEGATIVE mg/dL
SPECIFIC GRAVITY, URINE: 1.025 (ref 1.005–1.030)
UROBILINOGEN UA: 0.2 mg/dL (ref 0.0–1.0)

## 2015-11-03 LAB — GLUCOSE, CAPILLARY: Glucose-Capillary: 337 mg/dL — ABNORMAL HIGH (ref 65–99)

## 2015-11-03 MED ORDER — DULOXETINE HCL 20 MG PO CPEP: 60.0000 mg | ORAL_CAPSULE | Freq: Every day | ORAL | Status: DC

## 2015-11-03 MED ORDER — GLUCOSE BLOOD VI STRP: ORAL_STRIP | Status: DC

## 2015-11-03 MED ORDER — TRUE METRIX AIR GLUCOSE METER DEVI: 1.0000 | Freq: Three times a day (TID) | Status: DC

## 2015-11-03 MED ORDER — OMEPRAZOLE 20 MG PO CPDR: 20.0000 mg | DELAYED_RELEASE_CAPSULE | Freq: Every day | ORAL | Status: DC

## 2015-11-03 MED ORDER — LANCETS MISC. MISC: 1.0000 | Freq: Three times a day (TID) | Status: DC

## 2015-11-03 MED ORDER — METFORMIN HCL 500 MG PO TABS
500.0000 mg | ORAL_TABLET | Freq: Two times a day (BID) | ORAL | Status: DC
Start: 2015-11-03 — End: 2015-12-12

## 2015-11-03 MED ORDER — INSULIN DETEMIR 100 UNIT/ML FLEXPEN: 20.0000 [IU] | PEN_INJECTOR | Freq: Every day | SUBCUTANEOUS | Status: DC

## 2015-11-03 NOTE — Progress Notes (Signed)
Subjective:    Patient ID: Travis [name], male    DOB: 03/28/1971, 44 y.o.   MRN: [id number]  HPI [name], a 44 year old male with a history of uncontrolled diabetes type 2 with neuropathy presents to establish care. He was a patient of Dr. Lynnea FerrierWarren Pickard until loosing his insurance benefits. He states that he has been without medications for greater than 1 year for diabetes and neuropathy. He states that he was on short acting and long acting insulin. He was on Lantus 75 units daily. He maintains that neuropathy was controlled on Cymbalta. He has been out to medication for 1 year. Symptoms consists of paresthesia of the feet, polyuria and visual disturbances. Patient denies polydipsia and visual disturbances.  [name] does not check blood sugars at home; he is out of testing supplies.     Paitent complains of heartburn. This has been associated with early satiety and heartburn.  He denies chest pain, choking on food, cough, difficulty swallowing, dysphagia, hematemesis, need to clear throat frequently and shortness of breath. Symptoms have been present for several months. He denies dysphagia.  He has not lost weight. He denies melena, hematochezia, hematemesis, and coffee ground emesis. He has not been treated for GERD in the past.  Past Medical History  Diagnosis Date  . Diabetes mellitus   . Peripheral neuropathy (HCC)    Immunization History  Administered Date(s) Administered  . Influenza Whole 09/17/2009  . Pneumococcal Polysaccharide-23 07/13/2004, 08/30/2008   Social History   Social History  . Marital Status: Legally Separated    Spouse Name: N/A  . Number of Children: N/A  . Years of Education: N/A   Occupational History  . Not on file.   Social History Main Topics  . Smoking status: Never Smoker   . Smokeless tobacco: Never Used  . Alcohol Use: No  . Drug Use: No  . Sexual Activity: Yes   Other Topics Concern  . Not on file   Social History Narrative   No Known Allergies Review of Systems  Constitutional: Negative for fatigue and unexpected weight change.  HENT: Negative.  Negative for sore throat.   Eyes: Positive for visual disturbance (blurred vision primarily to left eye).  Respiratory: Negative.  Negative for shortness of breath.   Cardiovascular: Negative.   Gastrointestinal: Negative.  Negative for vomiting.  Endocrine: Positive for polyuria. Negative for polydipsia and polyphagia.  Musculoskeletal: Negative.   Skin: Negative.  Negative for wound.  Neurological: Positive for numbness (primarily to left foot).  Hematological: Negative.   Psychiatric/Behavioral: Negative.  Negative for suicidal ideas and sleep disturbance. The patient is not nervous/anxious.        Objective:   Physical Exam  Constitutional: He is oriented to person, place, and time. He appears well-developed and well-nourished.  HENT:  Head: Normocephalic and atraumatic.  Right Ear: External ear normal.  Left Ear: External ear normal.  Mouth/Throat: Oropharynx is clear and moist.  Eyes: EOM are normal. Pupils are equal, round, and reactive to light.  Neck: Normal range of motion. Neck supple.  Pulmonary/Chest: Effort normal and breath sounds normal.  Abdominal: Soft. Bowel sounds are normal.  Musculoskeletal: Normal range of motion.  Neurological: He is alert and oriented to person, place, and time. He has normal reflexes.  Skin: Skin is warm, dry and intact.  Healing scars to left forearm and bilateral lower extremities.   Psychiatric: He has a normal mood and affect. His behavior is normal. Judgment and  thought content normal.      BP 121/70 mmHg  Pulse 83  Temp(Src) 98.3 F (36.8 C) (Oral)  Resp 14  Ht  (1.88 m)  Wt 197 lb (89.359 kg)  BMI 25.28 kg/m2 Assessment & Plan:  1. Type 2 diabetes mellitus with diabetic polyneuropathy, with long-term current use of insulin (HCC) Discussed basic carbohydrate counting at length. He will check  blood sugars fasting and 2 hours post prandial after starting insulin. Patient will bring glucometer and medications to follow up appointment in 1 month. He can notify the office with any questions or concerns.  - Blood Glucose Monitoring Suppl (TRUE METRIX AIR GLUCOSE METER) DEVI; 1 each by Does not apply route 4 (four) times daily -  before meals and at bedtime.  Dispense: 1 Device; Refill: 0 - glucose blood (TRUE METRIX BLOOD GLUCOSE TEST) test strip; Use as instructed  Dispense: 100 each; Refill: 12 - Lancets Misc. MISC; 1 each by Does not apply route 4 (four) times daily -  with meals and at bedtime.  Dispense: 100 each; Refill: 11 - Microalbumin/Creatinine Ratio, Urine - CBC with Differential - Hemoglobin A1c - COMPLETE METABOLIC PANEL WITH GFR - metFORMIN (GLUCOPHAGE) 500 MG tablet; Take 1 tablet (500 mg total) by mouth 2 (two) times daily with a meal.  Dispense: 60 tablet; Refill: 2 - Insulin Detemir (LEVEMIR FLEXPEN) 100 UNIT/ML Pen; Inject 20 Units into the skin daily at 10 pm.  Dispense: 15 mL; Refill: 11 - Ambulatory referral to Ophthalmology  2. Neuropathy (HCC)  - DULoxetine (CYMBALTA) 20 MG capsule; Take 3 capsules (60 mg total) by mouth daily.  Dispense: 30 capsule; Refill: 2  3. Gastroesophageal reflux disease without esophagitis Will start a trial of omeprazole. Will follow up in 1 month. Discussed diet and other interventions at length, patient expressed understanding.  - omeprazole (PRILOSEC) 20 MG capsule; Take 1 capsule (20 mg total) by mouth daily.  Dispense: 30 capsule; Refill: 3  4. Visual disturbance Patient reports occasional blurred vision to left eye. Will send referral to opthalmology to rule out diabetic neuropathy. Patient has been without insulin for greater than 1 month.  - Ambulatory referral to Ophthalmology   Travis Maroon, FNP  RTC: 1 month  The patient was given clear instructions to go to ER or return to medical center if symptoms do not improve,  worsen or new problems develop. The patient verbalized understanding. Will notify patient with laboratory results.

## 2015-11-03 NOTE — Patient Instructions (Signed)
Food Choices for Gastroesophageal Reflux Disease, Adult When you have gastroesophageal reflux disease (GERD), the foods you eat and your eating habits are very important. Choosing the right foods can help ease the discomfort of GERD. WHAT GENERAL GUIDELINES DO I NEED TO FOLLOW?  Choose fruits, vegetables, whole grains, low-fat dairy products, and low-fat meat, fish, and poultry.  Limit fats such as oils, salad dressings, butter, nuts, and avocado.  Keep a food diary to identify foods that cause symptoms.  Avoid foods that cause reflux. These may be different for different people.  Eat frequent small meals instead of three large meals each day.  Eat your meals slowly, in a relaxed setting.  Limit fried foods.  Cook foods using methods other than frying.  Avoid drinking alcohol.  Avoid drinking large amounts of liquids with your meals.  Avoid bending over or lying down until 2-3 hours after eating. WHAT FOODS ARE NOT RECOMMENDED? The following are some foods and drinks that may worsen your symptoms: Vegetables Tomatoes. Tomato juice. Tomato and spaghetti sauce. Chili peppers. Onion and garlic. Horseradish. Fruits Oranges, grapefruit, and lemon (fruit and juice). Meats High-fat meats, fish, and poultry. This includes hot dogs, ribs, ham, sausage, salami, and bacon. Dairy Whole milk and chocolate milk. Sour cream. Cream. Butter. Ice cream. Cream cheese.  Beverages Coffee and tea, with or without caffeine. Carbonated beverages or energy drinks. Condiments Hot sauce. Barbecue sauce.  Sweets/Desserts Chocolate and cocoa. Donuts. Peppermint and spearmint. Fats and Oils High-fat foods, including [place] fries and potato chips. Other Vinegar. Strong spices, such as black pepper, white pepper, red pepper, cayenne, curry powder, cloves, ginger, and chili powder. The items listed above may not be a complete list of foods and beverages to avoid. Contact your dietitian for more  information.   This information is not intended to replace advice given to you by your health care provider. Make sure you discuss any questions you have with your health care provider.   Document Released: 11/29/2005 Document Revised: 12/20/2014 Document Reviewed: 10/03/2013 Elsevier Interactive Patient Education 2016 [place]. Gastroesophageal Reflux Disease, Adult Normally, food travels down the esophagus and stays in the stomach to be digested. If a person has gastroesophageal reflux disease (GERD), food and stomach acid move back up into the esophagus. When this happens, the esophagus becomes sore and swollen (inflamed). Over time, GERD can make small holes (ulcers) in the lining of the esophagus. HOME CARE Diet  Follow a diet as told by your doctor. You may need to avoid foods and drinks such as:  Coffee and tea (with or without caffeine).  Drinks that contain alcohol.  Energy drinks and sports drinks.  Carbonated drinks or sodas.  Chocolate and cocoa.  Peppermint and mint flavorings.  Garlic and onions.  Horseradish.  Spicy and acidic foods, such as peppers, chili powder, curry powder, vinegar, hot sauces, and BBQ sauce.  Citrus fruit juices and citrus fruits, such as oranges, lemons, and limes.  Tomato-based foods, such as red sauce, chili, salsa, and pizza with red sauce.  Fried and fatty foods, such as donuts, french fries, potato chips, and high-fat dressings.  High-fat meats, such as hot dogs, rib eye steak, sausage, ham, and bacon.  High-fat dairy items, such as whole milk, butter, and cream cheese.  Eat small meals often. Avoid eating large meals.  Avoid drinking large amounts of liquid with your meals.  Avoid eating meals during the 2-3 hours before bedtime.  Avoid lying down right after you eat.  Do  not exercise right after you eat. General Instructions  Pay attention to any changes in your symptoms.  Take over-the-counter and prescription  medicines only as told by your doctor. Do not take aspirin, ibuprofen, or other NSAIDs unless your doctor says it is okay.  Do not use any tobacco products, including cigarettes, chewing tobacco, and e-cigarettes. If you need help quitting, ask your doctor.  Wear loose clothes. Do not wear anything tight around your waist.  Raise (elevate) the head of your bed about 6 inches (15 cm).  Try to lower your stress. If you need help doing this, ask your doctor.  If you are overweight, lose an amount of weight that is healthy for you. Ask your doctor about a safe weight loss goal.  Keep all follow-up visits as told by your doctor. This is important. GET HELP IF:  You have new symptoms.  You lose weight and you do not know why it is happening.  You have trouble swallowing, or it hurts to swallow.  You have wheezing or a cough that keeps happening.  Your symptoms do not get better with treatment.  You have a hoarse voice. GET HELP RIGHT AWAY IF:  You have pain in your arms, neck, jaw, teeth, or back.  You feel sweaty, dizzy, or light-headed.  You have chest pain or shortness of breath.  You throw up (vomit) and your throw up looks like blood or coffee grounds.  You pass out (faint).  Your poop (stool) is bloody or black.  You cannot swallow, drink, or eat.   This information is not intended to replace advice given to you by your health care provider. Make sure you discuss any questions you have with your health care provider.   Document Released: 05/17/2008 Document Revised: 08/20/2015 Document Reviewed: 03/26/2015 Elsevier Interactive Patient Education 2016 [employer]. Diabetes Mellitus and Food It is important for you to manage your blood sugar (glucose) level. Your blood glucose level can be greatly affected by what you eat. Eating healthier foods in the appropriate amounts throughout the day at about the same time each day will help you control your blood glucose level.  It can also help slow or prevent worsening of your diabetes mellitus. Healthy eating may even help you improve the level of your blood pressure and reach or maintain a healthy weight.  General recommendations for healthful eating and cooking habits include:  Eating meals and snacks regularly. Avoid going long periods of time without eating to lose weight.  Eating a diet that consists mainly of plant-based foods, such as fruits, vegetables, nuts, legumes, and whole grains.  Using low-heat cooking methods, such as baking, instead of high-heat cooking methods, such as deep frying. Work with your dietitian to make sure you understand how to use the Nutrition Facts information on food labels. HOW CAN FOOD AFFECT ME? Carbohydrates Carbohydrates affect your blood glucose level more than any other type of food. Your dietitian will help you determine how many carbohydrates to eat at each meal and teach you how to count carbohydrates. Counting carbohydrates is important to keep your blood glucose at a healthy level, especially if you are using insulin or taking certain medicines for diabetes mellitus. Alcohol Alcohol can cause sudden decreases in blood glucose (hypoglycemia), especially if you use insulin or take certain medicines for diabetes mellitus. Hypoglycemia can be a life-threatening condition. Symptoms of hypoglycemia (sleepiness, dizziness, and disorientation) are similar to symptoms of having too much alcohol.  If your health care provider  has given you approval to drink alcohol, do so in moderation and use the following guidelines:  Women should not have more than one drink per day, and men should not have more than two drinks per day. One drink is equal to:  12 oz of beer.  5 oz of wine.  1 oz of hard liquor.  Do not drink on an empty stomach.  Keep yourself hydrated. Have water, diet soda, or unsweetened iced tea.  Regular soda, juice, and other mixers might contain a lot of  carbohydrates and should be counted. WHAT FOODS ARE NOT RECOMMENDED? As you make food choices, it is important to remember that all foods are not the same. Some foods have fewer nutrients per serving than other foods, even though they might have the same number of calories or carbohydrates. It is difficult to get your body what it needs when you eat foods with fewer nutrients. Examples of foods that you should avoid that are high in calories and carbohydrates but low in nutrients include:  Trans fats (most processed foods list trans fats on the Nutrition Facts label).  Regular soda.  Juice.  Candy.  Sweets, such as cake, pie, doughnuts, and cookies.  Fried foods. WHAT FOODS CAN I EAT? Eat nutrient-rich foods, which will nourish your body and keep you healthy. The food you should eat also will depend on several factors, including:  The calories you need.  The medicines you take.  Your weight.  Your blood glucose level.  Your blood pressure level.  Your cholesterol level. You should eat a variety of foods, including:  Protein.  Lean cuts of meat.  Proteins low in saturated fats, such as fish, egg whites, and beans. Avoid processed meats.  Fruits and vegetables.  Fruits and vegetables that may help control blood glucose levels, such as apples, mangoes, and yams.  Dairy products.  Choose fat-free or low-fat dairy products, such as milk, yogurt, and cheese.  Grains, bread, pasta, and rice.  Choose whole grain products, such as multigrain bread, whole oats, and brown rice. These foods may help control blood pressure.  Fats.  Foods containing healthful fats, such as nuts, avocado, olive oil, canola oil, and fish. DOES EVERYONE WITH DIABETES MELLITUS HAVE THE SAME MEAL PLAN? Because every person with diabetes mellitus is different, there is not one meal plan that works for everyone. It is very important that you meet with a dietitian who will help you create a meal plan  that is just right for you.   This information is not intended to replace advice given to you by your health care provider. Make sure you discuss any questions you have with your health care provider.   Document Released: 08/26/2005 Document Revised: 12/20/2014 Document Reviewed: 10/26/2013 Elsevier Interactive Patient Education 2016 [employer] Inc. Basic Carbohydrate Counting for Diabetes Mellitus Carbohydrate counting is a method for keeping track of the amount of carbohydrates you eat. Eating carbohydrates naturally increases the level of sugar (glucose) in your blood, so it is important for you to know the amount that is okay for you to have in every meal. Carbohydrate counting helps keep the level of glucose in your blood within normal limits. The amount of carbohydrates allowed is different for every person. A dietitian can help you calculate the amount that is right for you. Once you know the amount of carbohydrates you can have, you can count the carbohydrates in the foods you want to eat. Carbohydrates are found in the following foods:  Grains, such as breads and cereals.  Dried beans and soy products.  Starchy vegetables, such as potatoes, peas, and corn.  Fruit and fruit juices.  Milk and yogurt.  Sweets and snack foods, such as cake, cookies, candy, chips, soft drinks, and fruit drinks. CARBOHYDRATE COUNTING There are two ways to count the carbohydrates in your food. You can use either of the methods or a combination of both. Reading the "Nutrition Facts" on Packaged Food The "Nutrition Facts" is an area that is included on the labels of almost all packaged food and beverages in the [place]. It includes the serving size of that food or beverage and information about the nutrients in each serving of the food, including the grams (g) of carbohydrate per serving.  Decide the number of servings of this food or beverage that you will be able to eat or drink. Multiply that number  of servings by the number of grams of carbohydrate that is listed on the label for that serving. The total will be the amount of carbohydrates you will be having when you eat or drink this food or beverage. Learning Standard Serving Sizes of Food When you eat food that is not packaged or does not include "Nutrition Facts" on the label, you need to measure the servings in order to count the amount of carbohydrates.A serving of most carbohydrate-rich foods contains about 15 g of carbohydrates. The following list includes serving sizes of carbohydrate-rich foods that provide 15 g ofcarbohydrate per serving:   1 slice of bread (1 oz) or 1 six-inch tortilla.    of a hamburger bun or English muffin.  4-6 crackers.   cup unsweetened dry cereal.    cup hot cereal.   cup rice or pasta.    cup mashed potatoes or  of a large baked potato.  1 cup fresh fruit or one small piece of fruit.    cup canned or frozen fruit or fruit juice.  1 cup milk.   cup plain fat-free yogurt or yogurt sweetened with artificial sweeteners.   cup cooked dried beans or starchy vegetable, such as peas, corn, or potatoes.  Decide the number of standard-size servings that you will eat. Multiply that number of servings by 15 (the grams of carbohydrates in that serving). For example, if you eat 2 cups of strawberries, you will have eaten 2 servings and 30 g of carbohydrates (2 servings x 15 g = 30 g). For foods such as soups and casseroles, in which more than one food is mixed in, you will need to count the carbohydrates in each food that is included. EXAMPLE OF CARBOHYDRATE COUNTING Sample Dinner  3 oz chicken breast.   cup of brown rice.   cup of corn.  1 cup milk.   1 cup strawberries with sugar-free whipped topping.  Carbohydrate Calculation Step 1: Identify the foods that contain carbohydrates:   Rice.   Corn.   Milk.   Strawberries. Step 2:Calculate the number of servings  eaten of each:   2 servings of rice.   1 serving of corn.   1 serving of milk.   1 serving of strawberries. Step 3: Multiply each of those number of servings by 15 g:   2 servings of rice x 15 g = 30 g.   1 serving of corn x 15 g = 15 g.   1 serving of milk x 15 g = 15 g.   1 serving of strawberries x 15 g = 15 g.  Step 4: Add together all of the amounts to find the total grams of carbohydrates eaten: 30 g + 15 g + 15 g + 15 g = 75 g.   This information is not intended to replace advice given to you by your health care provider. Make sure you discuss any questions you have with your health care provider.   Document Released: 11/29/2005 Document Revised: 12/20/2014 Document Reviewed: 10/26/2013 Elsevier Interactive Patient Education [employer]

## 2015-11-04 ENCOUNTER — Telehealth: Payer: Self-pay

## 2015-11-04 ENCOUNTER — Other Ambulatory Visit: Payer: Self-pay | Admitting: Family Medicine

## 2015-11-04 DIAGNOSIS — Z794 Long term (current) use of insulin: Principal | ICD-10-CM

## 2015-11-04 DIAGNOSIS — E1142 Type 2 diabetes mellitus with diabetic polyneuropathy: Secondary | ICD-10-CM

## 2015-11-04 LAB — HEMOGLOBIN A1C
HEMOGLOBIN A1C: 11.1 % — AB (ref <5.7)
Mean Plasma Glucose: 272 mg/dL — ABNORMAL HIGH (ref <117)

## 2015-11-04 LAB — MICROALBUMIN / CREATININE URINE RATIO
Creatinine, Urine: 120 mg/dL (ref 20–370)
MICROALB/CREAT RATIO: 20 ug/mg{creat} (ref <30)
Microalb, Ur: 2.4 mg/dL

## 2015-11-04 MED ORDER — INSULIN SYRINGES (DISPOSABLE) U-100 0.3 ML MISC: 20.0000 [IU] | Freq: Every day | Status: DC

## 2015-11-04 MED ORDER — INSULIN DETEMIR 100 UNIT/ML ~~LOC~~ SOLN: 20.0000 [IU] | Freq: Every day | SUBCUTANEOUS | Status: DC

## 2015-11-04 NOTE — Telephone Encounter (Signed)
-----   Message from Massie MaroonLachina M Hollis, [place] sent at 11/04/2015  7:42 AM EST ----- Please inform patient that hemoglobin a1C is 11.1%, goal is <7.0%. Please start medications and follow low carbohydrate, low fat diet over 5-6 small meals. Drink protein supplements during times that appetite is suppressed.  Please follow up as scheduled.  Thanks ----- Message -----    From: [facility] [place]    Sent: 11/03/2015   2:06 PM      To: Massie MaroonLachina M Hollis, FNP

## 2015-11-04 NOTE — Telephone Encounter (Signed)
Called left message for patient to return call Thanks!  

## 2015-11-05 NOTE — Telephone Encounter (Signed)
LEFT MESSAGE FOR PATIENT TO RETURN CALL REGARDING LABS. THANKS!  

## 2015-11-10 ENCOUNTER — Other Ambulatory Visit: Payer: No Typology Code available for payment source

## 2015-11-10 ENCOUNTER — Encounter (HOSPITAL_COMMUNITY): Payer: Self-pay

## 2015-11-10 ENCOUNTER — Emergency Department (HOSPITAL_COMMUNITY): Payer: No Typology Code available for payment source

## 2015-11-10 ENCOUNTER — Inpatient Hospital Stay (HOSPITAL_COMMUNITY)
Admission: EM | Admit: 2015-11-10 | Discharge: 2015-11-14 | DRG: 563 | Disposition: A | Discharge status: Home / Self Care | Payer: No Typology Code available for payment source | Attending: Orthopedic Surgery | Admitting: Orthopedic Surgery

## 2015-11-10 DIAGNOSIS — H539 Unspecified visual disturbance: Secondary | ICD-10-CM

## 2015-11-10 DIAGNOSIS — L97509 Non-pressure chronic ulcer of other part of unspecified foot with unspecified severity: Secondary | ICD-10-CM

## 2015-11-10 DIAGNOSIS — E1142 Type 2 diabetes mellitus with diabetic polyneuropathy: Secondary | ICD-10-CM | POA: Diagnosis present

## 2015-11-10 DIAGNOSIS — Z01811 Encounter for preprocedural respiratory examination: Secondary | ICD-10-CM

## 2015-11-10 DIAGNOSIS — E11621 Type 2 diabetes mellitus with foot ulcer: Secondary | ICD-10-CM

## 2015-11-10 DIAGNOSIS — K219 Gastro-esophageal reflux disease without esophagitis: Secondary | ICD-10-CM | POA: Diagnosis present

## 2015-11-10 DIAGNOSIS — Y92410 Unspecified street and highway as the place of occurrence of the external cause: Secondary | ICD-10-CM

## 2015-11-10 DIAGNOSIS — E559 Vitamin D deficiency, unspecified: Secondary | ICD-10-CM | POA: Diagnosis present

## 2015-11-10 DIAGNOSIS — Z8631 Personal history of diabetic foot ulcer: Secondary | ICD-10-CM | POA: Diagnosis present

## 2015-11-10 DIAGNOSIS — E1143 Type 2 diabetes mellitus with diabetic autonomic (poly)neuropathy: Secondary | ICD-10-CM | POA: Diagnosis present

## 2015-11-10 DIAGNOSIS — M810 Age-related osteoporosis without current pathological fracture: Secondary | ICD-10-CM | POA: Diagnosis present

## 2015-11-10 DIAGNOSIS — M86172 Other acute osteomyelitis, left ankle and foot: Secondary | ICD-10-CM | POA: Diagnosis present

## 2015-11-10 DIAGNOSIS — E785 Hyperlipidemia, unspecified: Secondary | ICD-10-CM | POA: Diagnosis present

## 2015-11-10 DIAGNOSIS — E119 Type 2 diabetes mellitus without complications: Secondary | ICD-10-CM | POA: Diagnosis present

## 2015-11-10 DIAGNOSIS — E1169 Type 2 diabetes mellitus with other specified complication: Secondary | ICD-10-CM | POA: Diagnosis present

## 2015-11-10 DIAGNOSIS — Z794 Long term (current) use of insulin: Secondary | ICD-10-CM

## 2015-11-10 DIAGNOSIS — L02612 Cutaneous abscess of left foot: Secondary | ICD-10-CM | POA: Diagnosis present

## 2015-11-10 DIAGNOSIS — L97521 Non-pressure chronic ulcer of other part of left foot limited to breakdown of skin: Secondary | ICD-10-CM | POA: Diagnosis present

## 2015-11-10 DIAGNOSIS — M869 Osteomyelitis, unspecified: Secondary | ICD-10-CM | POA: Diagnosis present

## 2015-11-10 DIAGNOSIS — E1165 Type 2 diabetes mellitus with hyperglycemia: Secondary | ICD-10-CM | POA: Diagnosis present

## 2015-11-10 DIAGNOSIS — S82141A Displaced bicondylar fracture of right tibia, initial encounter for closed fracture: Principal | ICD-10-CM | POA: Diagnosis present

## 2015-11-10 HISTORY — DX: Vitamin D deficiency, unspecified: E55.9

## 2015-11-10 HISTORY — DX: Other acute osteomyelitis, left ankle and foot: M86.172

## 2015-11-10 HISTORY — DX: Gastro-esophageal reflux disease without esophagitis: K21.9

## 2015-11-10 HISTORY — DX: Displaced bicondylar fracture of right tibia, initial encounter for closed fracture: S82.141A

## 2015-11-10 HISTORY — DX: Type 2 diabetes mellitus with diabetic polyneuropathy: E11.42

## 2015-11-10 HISTORY — DX: Type 2 diabetes mellitus with foot ulcer: E11.621

## 2015-11-10 HISTORY — DX: Age-related osteoporosis without current pathological fracture: M81.0

## 2015-11-10 HISTORY — DX: Non-pressure chronic ulcer of other part of left foot with unspecified severity: L97.529

## 2015-11-10 MED ORDER — TETANUS-DIPHTH-ACELL PERTUSSIS 5-2.5-18.5 LF-MCG/0.5 IM SUSP
0.5000 mL | Freq: Once | INTRAMUSCULAR | Status: AC
  Administered 2015-11-10: 0.5 mL via INTRAMUSCULAR
  Filled 2015-11-10: qty 0.5
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
## 2015-11-10 NOTE — Telephone Encounter (Signed)
Called, no answer, will mail letter for patient to contact us regarding labs. Thanks!
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
## 2015-11-10 NOTE — ED Notes (Signed)
See EDP notes 

## 2015-11-11 ENCOUNTER — Inpatient Hospital Stay (HOSPITAL_COMMUNITY): Payer: No Typology Code available for payment source

## 2015-11-11 DIAGNOSIS — S82141A Displaced bicondylar fracture of right tibia, initial encounter for closed fracture: Secondary | ICD-10-CM | POA: Diagnosis present

## 2015-11-11 DIAGNOSIS — E1142 Type 2 diabetes mellitus with diabetic polyneuropathy: Secondary | ICD-10-CM | POA: Diagnosis present

## 2015-11-11 DIAGNOSIS — L02612 Cutaneous abscess of left foot: Secondary | ICD-10-CM | POA: Diagnosis present

## 2015-11-11 DIAGNOSIS — E559 Vitamin D deficiency, unspecified: Secondary | ICD-10-CM | POA: Diagnosis present

## 2015-11-11 DIAGNOSIS — S8991XA Unspecified injury of right lower leg, initial encounter: Secondary | ICD-10-CM | POA: Diagnosis present

## 2015-11-11 DIAGNOSIS — M869 Osteomyelitis, unspecified: Secondary | ICD-10-CM | POA: Diagnosis present

## 2015-11-11 DIAGNOSIS — E1165 Type 2 diabetes mellitus with hyperglycemia: Secondary | ICD-10-CM | POA: Diagnosis present

## 2015-11-11 DIAGNOSIS — L97521 Non-pressure chronic ulcer of other part of left foot limited to breakdown of skin: Secondary | ICD-10-CM | POA: Diagnosis present

## 2015-11-11 DIAGNOSIS — E11621 Type 2 diabetes mellitus with foot ulcer: Secondary | ICD-10-CM | POA: Diagnosis present

## 2015-11-11 DIAGNOSIS — E1169 Type 2 diabetes mellitus with other specified complication: Secondary | ICD-10-CM | POA: Diagnosis present

## 2015-11-11 DIAGNOSIS — Z794 Long term (current) use of insulin: Secondary | ICD-10-CM | POA: Diagnosis not present

## 2015-11-11 DIAGNOSIS — Y92410 Unspecified street and highway as the place of occurrence of the external cause: Secondary | ICD-10-CM | POA: Diagnosis not present

## 2015-11-11 HISTORY — DX: Displaced bicondylar fracture of right tibia, initial encounter for closed fracture: S82.141A

## 2015-11-11 LAB — CBC WITH DIFFERENTIAL/PLATELET
Basophils Absolute: 0.1 10*3/uL (ref 0.0–0.1)
Basophils Relative: 0 %
Eosinophils Absolute: 0.2 10*3/uL (ref 0.0–0.7)
Eosinophils Relative: 1 %
HCT: 38.9 % — ABNORMAL LOW (ref 39.0–52.0)
HEMOGLOBIN: 13.3 g/dL (ref 13.0–17.0)
LYMPHS PCT: 22 %
Lymphs Abs: 2.7 10*3/uL (ref 0.7–4.0)
MCH: 30 pg (ref 26.0–34.0)
MCHC: 34.2 g/dL (ref 30.0–36.0)
MCV: 87.8 fL (ref 78.0–100.0)
MONO ABS: 0.9 10*3/uL (ref 0.1–1.0)
MONOS PCT: 7 %
NEUTROS ABS: 8.6 10*3/uL — AB (ref 1.7–7.7)
NEUTROS PCT: 70 %
Platelets: 297 10*3/uL (ref 150–400)
RBC: 4.43 MIL/uL (ref 4.22–5.81)
RDW: 13 % (ref 11.5–15.5)
WBC: 12.4 10*3/uL — ABNORMAL HIGH (ref 4.0–10.5)

## 2015-11-11 LAB — COMPREHENSIVE METABOLIC PANEL
ALK PHOS: 84 U/L (ref 38–126)
ALT: 22 U/L (ref 17–63)
AST: 21 U/L (ref 15–41)
Albumin: 3.6 g/dL (ref 3.5–5.0)
Anion gap: 7 (ref 5–15)
BUN: 8 mg/dL (ref 6–20)
CALCIUM: 8.9 mg/dL (ref 8.9–10.3)
CHLORIDE: 101 mmol/L (ref 101–111)
CO2: 26 mmol/L (ref 22–32)
CREATININE: 0.55 mg/dL — AB (ref 0.61–1.24)
Glucose, Bld: 282 mg/dL — ABNORMAL HIGH (ref 65–99)
Potassium: 3.8 mmol/L (ref 3.5–5.1)
SODIUM: 134 mmol/L — AB (ref 135–145)
Total Bilirubin: 1.5 mg/dL — ABNORMAL HIGH (ref 0.3–1.2)
Total Protein: 6.3 g/dL — ABNORMAL LOW (ref 6.5–8.1)

## 2015-11-11 LAB — PROTIME-INR
INR: 1.14 (ref 0.00–1.49)
Prothrombin Time: 14.7 s (ref 11.6–15.2)

## 2015-11-11 LAB — BASIC METABOLIC PANEL WITH GFR
Anion gap: 9 (ref 5–15)
BUN: 10 mg/dL (ref 6–20)
CO2: 28 mmol/L (ref 22–32)
Calcium: 9.1 mg/dL (ref 8.9–10.3)
Chloride: 98 mmol/L — ABNORMAL LOW (ref 101–111)
Creatinine, Ser: 0.52 mg/dL — ABNORMAL LOW (ref 0.61–1.24)
GFR calc Af Amer: >60 mL/min
GFR calc non Af Amer: >60 mL/min
Glucose, Bld: 269 mg/dL — ABNORMAL HIGH (ref 65–99)
Potassium: 3.6 mmol/L (ref 3.5–5.1)
Sodium: 135 mmol/L (ref 135–145)

## 2015-11-11 LAB — GLUCOSE, CAPILLARY
Glucose-Capillary: 275 mg/dL — ABNORMAL HIGH (ref 65–99)
Glucose-Capillary: 263 mg/dL — ABNORMAL HIGH (ref 65–99)
Glucose-Capillary: 154 mg/dL — ABNORMAL HIGH (ref 65–99)
Glucose-Capillary: 189 mg/dL — ABNORMAL HIGH (ref 65–99)
Glucose-Capillary: 227 mg/dL — ABNORMAL HIGH (ref 65–99)

## 2015-11-11 LAB — TSH: TSH: 1.742 u[IU]/mL (ref 0.350–4.500)

## 2015-11-11 LAB — PREALBUMIN: Prealbumin: 20.2 mg/dL (ref 18–38)

## 2015-11-11 LAB — MAGNESIUM: Magnesium: 2 mg/dL (ref 1.7–2.4)

## 2015-11-11 LAB — PHOSPHORUS: PHOSPHORUS: 3.3 mg/dL (ref 2.5–4.6)

## 2015-11-11 LAB — APTT: aPTT: 33 seconds (ref 24–37)

## 2015-11-11 LAB — TRANSFERRIN: Transferrin: 157 mg/dL — ABNORMAL LOW (ref 180–329)

## 2015-11-11 MED ORDER — LACTATED RINGERS IV SOLN
INTRAVENOUS | Status: DC
  Administered 2015-11-11 – 2015-11-13 (×3): via INTRAVENOUS

## 2015-11-11 MED ORDER — DULOXETINE HCL 60 MG PO CPEP
60.0000 mg | ORAL_CAPSULE | Freq: Every day | ORAL | Status: DC
  Administered 2015-11-11 – 2015-11-14 (×4): 60 mg via ORAL
  Filled 2015-11-11 (×4): qty 1

## 2015-11-11 MED ORDER — OXYCODONE HCL 5 MG PO TABS: 5.0000 mg | ORAL_TABLET | ORAL | Status: DC | PRN

## 2015-11-11 MED ORDER — INSULIN ASPART 100 UNIT/ML ~~LOC~~ SOLN
0.0000 [IU] | Freq: Three times a day (TID) | SUBCUTANEOUS | Status: DC
  Administered 2015-11-11: 8 [IU] via SUBCUTANEOUS
  Administered 2015-11-11: 3 [IU] via SUBCUTANEOUS
  Administered 2015-11-12: 2 [IU] via SUBCUTANEOUS
  Administered 2015-11-12 – 2015-11-14 (×5): 3 [IU] via SUBCUTANEOUS
  Administered 2015-11-14: 2 [IU] via SUBCUTANEOUS

## 2015-11-11 MED ORDER — BACITRACIN ZINC 500 UNIT/GM EX OINT
TOPICAL_OINTMENT | Freq: Two times a day (BID) | CUTANEOUS | Status: DC
  Administered 2015-11-11 – 2015-11-14 (×6): via TOPICAL
  Filled 2015-11-11 (×16): qty 28.35

## 2015-11-11 MED ORDER — INSULIN ASPART 100 UNIT/ML ~~LOC~~ SOLN
0.0000 [IU] | Freq: Every day | SUBCUTANEOUS | Status: DC
  Administered 2015-11-11 – 2015-11-12 (×2): 2 [IU] via SUBCUTANEOUS

## 2015-11-11 MED ORDER — PANTOPRAZOLE SODIUM 40 MG PO TBEC
40.0000 mg | DELAYED_RELEASE_TABLET | Freq: Every day | ORAL | Status: DC
  Administered 2015-11-11 – 2015-11-14 (×4): 40 mg via ORAL
  Filled 2015-11-11 (×4): qty 1

## 2015-11-11 MED ORDER — ONDANSETRON HCL 4 MG PO TABS
4.0000 mg | ORAL_TABLET | Freq: Four times a day (QID) | ORAL | Status: DC | PRN
  Administered 2015-11-13: 4 mg via ORAL
  Filled 2015-11-11: qty 1

## 2015-11-11 MED ORDER — OXYCODONE-ACETAMINOPHEN 5-325 MG PO TABS
1.0000 | ORAL_TABLET | Freq: Four times a day (QID) | ORAL | Status: DC | PRN
Start: 2015-11-11 — End: 2015-11-14

## 2015-11-11 MED ORDER — INSULIN DETEMIR 100 UNIT/ML ~~LOC~~ SOLN
20.0000 [IU] | Freq: Every day | SUBCUTANEOUS | Status: DC
  Administered 2015-11-11 – 2015-11-13 (×3): 20 [IU] via SUBCUTANEOUS
  Filled 2015-11-11 (×4): qty 0.2

## 2015-11-11 MED ORDER — PANTOPRAZOLE SODIUM 40 MG PO TBEC: 40.0000 mg | DELAYED_RELEASE_TABLET | Freq: Every day | ORAL | Status: DC

## 2015-11-11 MED ORDER — INSULIN ASPART 100 UNIT/ML ~~LOC~~ SOLN: 0.0000 [IU] | Freq: Three times a day (TID) | SUBCUTANEOUS | Status: DC

## 2015-11-11 MED ORDER — METFORMIN HCL 500 MG PO TABS
500.0000 mg | ORAL_TABLET | Freq: Two times a day (BID) | ORAL | Status: DC
Start: 2015-11-11 — End: 2015-11-14
  Administered 2015-11-11 – 2015-11-14 (×5): 500 mg via ORAL
  Filled 2015-11-11 (×7): qty 1

## 2015-11-11 MED ORDER — MORPHINE SULFATE (PF) 2 MG/ML IV SOLN: 2.0000 mg | INTRAVENOUS | Status: DC | PRN

## 2015-11-11 MED ORDER — METHOCARBAMOL 1000 MG/10ML IJ SOLN
500.0000 mg | Freq: Four times a day (QID) | INTRAVENOUS | Status: DC | PRN
  Filled 2015-11-11: qty 5

## 2015-11-11 MED ORDER — ONDANSETRON HCL 4 MG/2ML IJ SOLN
4.0000 mg | Freq: Four times a day (QID) | INTRAMUSCULAR | Status: DC | PRN
  Administered 2015-11-11 (×2): 4 mg via INTRAVENOUS
  Filled 2015-11-11 (×2): qty 2

## 2015-11-11 MED ORDER — INSULIN ASPART 100 UNIT/ML ~~LOC~~ SOLN
4.0000 [IU] | Freq: Three times a day (TID) | SUBCUTANEOUS | Status: DC
  Administered 2015-11-11 – 2015-11-13 (×7): 4 [IU] via SUBCUTANEOUS

## 2015-11-11 MED ORDER — METHOCARBAMOL 500 MG PO TABS: 500.0000 mg | ORAL_TABLET | Freq: Four times a day (QID) | ORAL | Status: DC | PRN
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
## 2015-11-11 NOTE — Progress Notes (Signed)
Inpatient Diabetes Program Recommendations  AACE/ADA: New Consensus Statement on Inpatient Glycemic Control (2015)  Target Ranges:  Prepandial:   less than 140 mg/dL      Peak postprandial:   less than 180 mg/dL (1-2 hours)      Critically ill patients:  140 - 180 mg/dL   Review of Glycemic Control:  Results for Katha CabalKNIGHT, Nicandro T (MRN [id number]) as of 11/11/2015 08:52  Ref. Range 11/11/2015 02:25  Glucose-Capillary Latest Ref Range: 65-99 mg/dL 409275 (H)   Diabetes history: Type 2 diabetes Outpatient Diabetes medications: Levemir 20 units q HS Current orders for Inpatient glycemic control:  None Inpatient Diabetes Program Recommendations:  Note history of diabetes.  Called and discussed with RN regarding need for orders.  Please consider adding Novolog moderate correction q 4 hours.  Also may consider ordering 1/2 of patient's home dose of Levemir.  Please consider Levemir 10 units daily.  Thanks, Beryl MeagerJenny Inocente Krach, RN, BC-ADM Inpatient Diabetes Coordinator Pager [phone] (8a-5p)

## 2015-11-11 NOTE — ED Notes (Signed)
Asked sec to page ortho tech
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
## 2015-11-12 ENCOUNTER — Encounter (HOSPITAL_COMMUNITY): Payer: Self-pay | Admitting: Orthopedic Surgery

## 2015-11-12 ENCOUNTER — Inpatient Hospital Stay (HOSPITAL_COMMUNITY): Payer: No Typology Code available for payment source

## 2015-11-12 DIAGNOSIS — E559 Vitamin D deficiency, unspecified: Secondary | ICD-10-CM

## 2015-11-12 DIAGNOSIS — S8991XA Unspecified injury of right lower leg, initial encounter: Secondary | ICD-10-CM | POA: Diagnosis not present

## 2015-11-12 DIAGNOSIS — S82141A Displaced bicondylar fracture of right tibia, initial encounter for closed fracture: Secondary | ICD-10-CM | POA: Diagnosis not present

## 2015-11-12 DIAGNOSIS — L97509 Non-pressure chronic ulcer of other part of unspecified foot with unspecified severity: Secondary | ICD-10-CM

## 2015-11-12 DIAGNOSIS — M86172 Other acute osteomyelitis, left ankle and foot: Secondary | ICD-10-CM

## 2015-11-12 DIAGNOSIS — E11621 Type 2 diabetes mellitus with foot ulcer: Secondary | ICD-10-CM

## 2015-11-12 DIAGNOSIS — M810 Age-related osteoporosis without current pathological fracture: Secondary | ICD-10-CM

## 2015-11-12 HISTORY — DX: Other acute osteomyelitis, left ankle and foot: M86.172

## 2015-11-12 HISTORY — DX: Age-related osteoporosis without current pathological fracture: M81.0

## 2015-11-12 HISTORY — DX: Vitamin D deficiency, unspecified: E55.9

## 2015-11-12 LAB — CBC WITH DIFFERENTIAL/PLATELET
BASOS PCT: 1 %
Basophils Absolute: 0 10*3/uL (ref 0.0–0.1)
EOS ABS: 0.1 10*3/uL (ref 0.0–0.7)
EOS PCT: 1 %
HCT: 37.2 % — ABNORMAL LOW (ref 39.0–52.0)
Hemoglobin: 12.8 g/dL — ABNORMAL LOW (ref 13.0–17.0)
LYMPHS ABS: 2.8 10*3/uL (ref 0.7–4.0)
Lymphocytes Relative: 32 %
MCH: 29.8 pg (ref 26.0–34.0)
MCHC: 34.4 g/dL (ref 30.0–36.0)
MCV: 86.7 fL (ref 78.0–100.0)
MONO ABS: 0.5 10*3/uL (ref 0.1–1.0)
MONOS PCT: 6 %
Neutro Abs: 5.3 10*3/uL (ref 1.7–7.7)
Neutrophils Relative %: 60 %
Platelets: 269 10*3/uL (ref 150–400)
RBC: 4.29 MIL/uL (ref 4.22–5.81)
RDW: 13 % (ref 11.5–15.5)
WBC: 8.7 10*3/uL (ref 4.0–10.5)

## 2015-11-12 LAB — VITAMIN D 25 HYDROXY (VIT D DEFICIENCY, FRACTURES): Vit D, 25-Hydroxy: 17.8 ng/mL — ABNORMAL LOW (ref 30.0–100.0)

## 2015-11-12 LAB — PTH, INTACT AND CALCIUM
CALCIUM TOTAL (PTH): 9 mg/dL (ref 8.7–10.2)
PTH: 31 pg/mL (ref 15–65)

## 2015-11-12 LAB — URINE MICROSCOPIC-ADD ON: RBC / HPF: NONE SEEN RBC/hpf (ref 0–5)

## 2015-11-12 LAB — URINALYSIS, ROUTINE W REFLEX MICROSCOPIC
BILIRUBIN URINE: NEGATIVE
Hgb urine dipstick: NEGATIVE
Leukocytes, UA: NEGATIVE
NITRITE: NEGATIVE
PH: 6 (ref 5.0–8.0)
Protein, ur: NEGATIVE mg/dL
Specific Gravity, Urine: 1.031 — ABNORMAL HIGH (ref 1.005–1.030)

## 2015-11-12 LAB — IRON AND TIBC
Iron: 70 ug/dL (ref 45–182)
Saturation Ratios: 35 % (ref 17.9–39.5)
TIBC: 202 ug/dL — ABNORMAL LOW (ref 250–450)
UIBC: 132 ug/dL

## 2015-11-12 LAB — FERRITIN: FERRITIN: 309 ng/mL (ref 24–336)

## 2015-11-12 LAB — GLUCOSE, CAPILLARY
GLUCOSE-CAPILLARY: 190 mg/dL — AB (ref 65–99)
Glucose-Capillary: 131 mg/dL — ABNORMAL HIGH (ref 65–99)
Glucose-Capillary: 172 mg/dL — ABNORMAL HIGH (ref 65–99)

## 2015-11-12 LAB — RETICULOCYTES
RBC.: 4.29 MIL/uL (ref 4.22–5.81)
RETIC COUNT ABSOLUTE: 77.2 10*3/uL (ref 19.0–186.0)
Retic Ct Pct: 1.8 % (ref 0.4–3.1)

## 2015-11-12 LAB — VITAMIN B12: Vitamin B-12: 462 pg/mL (ref 180–914)

## 2015-11-12 LAB — FOLATE: Folate: 24.4 ng/mL (ref >5.9)

## 2015-11-12 MED ORDER — CEFAZOLIN SODIUM 1-5 GM-% IV SOLN
1.0000 g | Freq: Three times a day (TID) | INTRAVENOUS | Status: DC
  Administered 2015-11-12: 1 g via INTRAVENOUS
  Filled 2015-11-12 (×4): qty 50

## 2015-11-12 MED ORDER — VITAMIN D (ERGOCALCIFEROL) 1.25 MG (50000 UNIT) PO CAPS
50000.0000 [IU] | ORAL_CAPSULE | ORAL | Status: DC
  Administered 2015-11-12: 50000 [IU] via ORAL
  Filled 2015-11-12: qty 1

## 2015-11-12 MED ORDER — VITAMIN D 1000 UNITS PO TABS
1000.0000 [IU] | ORAL_TABLET | Freq: Two times a day (BID) | ORAL | Status: DC
  Administered 2015-11-12 – 2015-11-14 (×5): 1000 [IU] via ORAL
  Filled 2015-11-12 (×5): qty 1

## 2015-11-12 MED ORDER — CEFAZOLIN SODIUM 1-5 GM-% IV SOLN
1.0000 g | Freq: Three times a day (TID) | INTRAVENOUS | Status: DC
  Administered 2015-11-12 – 2015-11-14 (×6): 1 g via INTRAVENOUS
  Filled 2015-11-12 (×7): qty 50

## 2015-11-12 MED ORDER — ENOXAPARIN SODIUM 40 MG/0.4ML ~~LOC~~ SOLN
40.0000 mg | SUBCUTANEOUS | Status: DC
  Administered 2015-11-12 – 2015-11-14 (×3): 40 mg via SUBCUTANEOUS
  Filled 2015-11-12 (×3): qty 0.4

## 2015-11-12 MED ORDER — MUPIROCIN CALCIUM 2 % EX CREA
TOPICAL_CREAM | Freq: Two times a day (BID) | CUTANEOUS | Status: DC
  Administered 2015-11-12 – 2015-11-14 (×5): via TOPICAL
  Filled 2015-11-12: qty 15

## 2015-11-12 MED ORDER — ASPIRIN 325 MG PO TABS
325.0000 mg | ORAL_TABLET | Freq: Every day | ORAL | Status: DC
  Administered 2015-11-12 – 2015-11-14 (×3): 325 mg via ORAL
  Filled 2015-11-12 (×3): qty 1

## 2015-11-12 MED ORDER — MUPIROCIN 2 % EX OINT: 1.0000 "application " | TOPICAL_OINTMENT | Freq: Two times a day (BID) | CUTANEOUS | Status: DC

## 2015-11-12 MED ORDER — VITAMIN D 1000 UNITS PO TABS: 1000.0000 [IU] | ORAL_TABLET | Freq: Two times a day (BID) | ORAL | Status: DC

## 2015-11-12 MED ORDER — DOXYCYCLINE HYCLATE 50 MG PO CAPS: 100.0000 mg | ORAL_CAPSULE | Freq: Two times a day (BID) | ORAL | Status: DC

## 2015-11-12 MED ORDER — VITAMIN C 500 MG PO TABS
500.0000 mg | ORAL_TABLET | Freq: Every day | ORAL | Status: DC
  Administered 2015-11-12 – 2015-11-14 (×3): 500 mg via ORAL
  Filled 2015-11-12 (×3): qty 1

## 2015-11-12 NOTE — Progress Notes (Signed)
Orthopedic Tech Progress Note Patient Details:  Travis CabalMichael T [name] 05/09/1971 [id number]  Ortho Devices Type of Ortho Device: Postop shoe/boot Ortho Device/Splint Location: lle Ortho Device/Splint Interventions: Application   Travis [name] 11/12/2015, 8:19 AM

## 2015-11-12 NOTE — Progress Notes (Signed)
VASCULAR LAB PRELIMINARY  ARTERIAL  ABI completed: Bilateral ABIs appears normal.   RIGHT    LEFT    PRESSURE WAVEFORM  PRESSURE WAVEFORM  BRACHIAL 121 Normal BRACHIAL 128 Normal  DP 158 Triphasic DP 143 Triphasic  PT 255/[place] Triphasic PT 255/[place] Triphasic  GREAT TOE 0.77 NA GREAT TOE 0.68 NA    RIGHT LEFT  ABI 1.2 1.1     Cyerra Yim D, RVT 11/12/2015, 3:24 PM
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
## 2015-11-12 NOTE — Progress Notes (Signed)
Orthopedic Tech Progress Note Patient Details:  Travis [name] 02/02/1971 [id number] Applied Watson-Jones splint to RLE from foot to thigh.  Placed RLE back into Velcro knee immobilizer after application of Watson-Jones splint.  Pulses, motion intact before and after application.  Capillary refill less than 2 seconds before and after application.  Pt. stated "I have neuropathy and that's why I can't feel your touch." Ortho Devices Type of Ortho Device: Lenora BoysWatson Jones splint Ortho Device/Splint Location: RLE Ortho Device/Splint Interventions: Application   Lesle ChrisGilliland, Braden Cimo L 11/12/2015, 12:56 AM

## 2015-11-12 NOTE — Progress Notes (Signed)
Inpatient Diabetes Program Recommendations  AACE/ADA: New Consensus Statement on Inpatient Glycemic Control (2015)  Target Ranges:  Prepandial:   less than 140 mg/dL      Peak postprandial:   less than 180 mg/dL (1-2 hours)      Critically ill patients:  140 - 180 mg/dL   Review of Glycemic Control:  Results for Travis [name], Marshal T (MRN [id number]) as of 11/12/2015 11:22  Ref. Range 11/11/2015 11:30 11/11/2015 16:10 11/11/2015 18:11 11/11/2015 21:40 11/12/2015 06:41  Glucose-Capillary Latest Ref Range: 65-99 mg/dL 865263 (H) 784154 (H) 696189 (H) 227 (H) 131 (H)   Diabetes history: Type 2 diabetes Outpatient Diabetes medications: Levemir 20 units daily, Metformin 500 mg bid Current orders for Inpatient glycemic control:  Novolog moderate tid with meals and HS, Novolog 4 units tid with meals, Levemir 20 units daily Inpatient Diabetes Program Recommendations:    Spoke with patient regarding home diabetes management.  He states that he has recently restarted Levemir insulin in the past 1 wk. He is being seen at the "sickle cell clinic" by Dr. Ashley RoyaltyMatthews.  He just qualified for the orange card and states he is in the process of applying for medication assistance.  Blood sugars improved this morning.  Encouraged patient to continue follow-up with PCP.  Thanks, Beryl MeagerJenny Victorya Hillman, RN, BC-ADM Inpatient Diabetes Coordinator Pager [phone] (8a-5p)
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
## 2015-11-13 ENCOUNTER — Encounter (HOSPITAL_COMMUNITY)
Admission: EM | Disposition: A | Discharge status: Home / Self Care | Payer: No Typology Code available for payment source | Source: Home / Self Care | Attending: Orthopedic Surgery

## 2015-11-13 LAB — GLUCOSE, CAPILLARY
GLUCOSE-CAPILLARY: 214 mg/dL — AB (ref 65–99)
Glucose-Capillary: 140 mg/dL — ABNORMAL HIGH (ref 65–99)
Glucose-Capillary: 169 mg/dL — ABNORMAL HIGH (ref 65–99)
Glucose-Capillary: 166 mg/dL — ABNORMAL HIGH (ref 65–99)
Glucose-Capillary: 169 mg/dL — ABNORMAL HIGH (ref 65–99)

## 2015-11-13 LAB — SURGICAL PCR SCREEN
MRSA, PCR: NEGATIVE
Staphylococcus aureus: NEGATIVE

## 2015-11-13 SURGERY — OPEN REDUCTION INTERNAL FIXATION (ORIF) TIBIAL PLATEAU
Anesthesia: General | Laterality: Right

## 2015-11-13 MED ORDER — INSULIN ASPART 100 UNIT/ML ~~LOC~~ SOLN: 0.0000 [IU] | Freq: Every day | SUBCUTANEOUS | Status: DC

## 2015-11-13 MED ORDER — INSULIN ASPART 100 UNIT/ML ~~LOC~~ SOLN: 0.0000 [IU] | Freq: Three times a day (TID) | SUBCUTANEOUS | Status: DC
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
## 2015-11-13 NOTE — Care Management (Signed)
Utilization review completed. Nayomi Tabron, RN Case Manager [phone]. 

## 2015-11-13 NOTE — Progress Notes (Signed)
Orthopedic Tech Progress Note Patient Details:  Travis [name] 08/10/1971 [id number]  Patient ID: Travis [name], male   DOB: 12/02/1971, 44 y.o.   MRN: [id number] Called in advanced brace order; spoke with Travis [name]  Mykael Batz 11/13/2015, 9:29 AM

## 2015-11-14 ENCOUNTER — Encounter (HOSPITAL_COMMUNITY): Payer: Self-pay | Admitting: Orthopedic Surgery

## 2015-11-14 DIAGNOSIS — L97529 Non-pressure chronic ulcer of other part of left foot with unspecified severity: Secondary | ICD-10-CM

## 2015-11-14 DIAGNOSIS — E11621 Type 2 diabetes mellitus with foot ulcer: Secondary | ICD-10-CM

## 2015-11-14 DIAGNOSIS — Z8631 Personal history of diabetic foot ulcer: Secondary | ICD-10-CM | POA: Diagnosis present

## 2015-11-14 HISTORY — DX: Type 2 diabetes mellitus with foot ulcer: E11.621

## 2015-11-14 HISTORY — DX: Non-pressure chronic ulcer of other part of left foot with unspecified severity: L97.529

## 2015-11-14 LAB — GLUCOSE, CAPILLARY
GLUCOSE-CAPILLARY: 155 mg/dL — AB (ref 65–99)
Glucose-Capillary: 141 mg/dL — ABNORMAL HIGH (ref 65–99)

## 2015-11-14 LAB — SEX HORMONE BINDING GLOBULIN: Sex Hormone Binding: 40.9 nmol/L (ref 16.5–55.9)

## 2015-11-14 LAB — TESTOSTERONE: Testosterone: 272 ng/dL — ABNORMAL LOW (ref 348–1197)

## 2015-11-14 LAB — PROLACTIN: PROLACTIN: 11.2 ng/mL (ref 4.0–15.2)

## 2015-11-14 LAB — TESTOSTERONE, FREE: Testosterone, Free: 11 pg/mL (ref 6.8–21.5)

## 2015-11-14 LAB — VITAMIN B1: VITAMIN B1 (THIAMINE): 171.2 nmol/L (ref 66.5–200.0)

## 2015-11-14 MED ORDER — VITAMIN D (ERGOCALCIFEROL) 1.25 MG (50000 UNIT) PO CAPS: 50000.0000 [IU] | ORAL_CAPSULE | ORAL | Status: DC

## 2015-11-14 MED ORDER — OXYCODONE HCL 5 MG PO TABS: 5.0000 mg | ORAL_TABLET | Freq: Four times a day (QID) | ORAL | Status: DC | PRN

## 2015-11-14 MED ORDER — ASPIRIN 325 MG PO TABS: 325.0000 mg | ORAL_TABLET | Freq: Every day | ORAL | Status: DC

## 2015-11-14 MED ORDER — METHOCARBAMOL 500 MG PO TABS: 500.0000 mg | ORAL_TABLET | Freq: Four times a day (QID) | ORAL | Status: DC | PRN

## 2015-11-14 MED ORDER — OXYCODONE-ACETAMINOPHEN 5-325 MG PO TABS: 1.0000 | ORAL_TABLET | Freq: Four times a day (QID) | ORAL | Status: DC | PRN

## 2015-11-14 MED ORDER — DOXYCYCLINE HYCLATE 50 MG PO CAPS: 100.0000 mg | ORAL_CAPSULE | Freq: Two times a day (BID) | ORAL | Status: DC

## 2015-11-14 MED ORDER — INSULIN ASPART 100 UNIT/ML FLEXPEN: 0.0000 [IU] | PEN_INJECTOR | Freq: Three times a day (TID) | SUBCUTANEOUS | Status: DC

## 2015-11-14 MED ORDER — ASCORBIC ACID 500 MG PO TABS: 500.0000 mg | ORAL_TABLET | Freq: Every day | ORAL | Status: DC

## 2015-11-14 NOTE — Care Management (Signed)
Case manager contacted [facility] to confirm patient is able to get [facility] prescriptions [facility]. Patient has a script for Novolog 100unit/ml flexPen. CM provided patient with address and hours of operation for the pharmacy.

## 2015-11-14 NOTE — Progress Notes (Addendum)
Called and spoke with PA, Montez MoritaKeith Paul.  He states that he would like patient to also be on a bedtime Novolog scale per below.   Correction coverage: HS scale      CBG < 70: implement hypoglycemia protocol     CBG 70 - 120: 0 units     CBG 121 - 150: 0 units     CBG 151 - 200: 0 units     CBG 201 - 250: 2 units     CBG 251 - 300: 3 units     CBG 301 - 350: 4 units     CBG 351 - 400: 5 units         Called and discussed with RN and she states she will instruct patient on bedtime Novolog scale.  Thanks, Beryl MeagerJenny Zahraa Bhargava, RN, BC-ADM Inpatient Diabetes Coordinator Pager [phone] (8a-5p)
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
## 2015-11-14 NOTE — Discharge Instructions (Signed)
Orthopaedic Trauma Service Discharge Instructions   General Discharge Instructions  WEIGHT BEARING STATUS: Nonweightbearing Right leg. Weightbearing as tolerated left leg  RANGE OF MOTION/ACTIVITY: range of motion as tolerated right knee   Wound Care: dressing changes 2x/day Left foot with bactroban ointment   PAIN MEDICATION USE AND EXPECTATIONS  You have likely been given narcotic medications to help control your pain.  After a traumatic event that results in an fracture (broken bone) with or without surgery, it is ok to use narcotic pain medications to help control one's pain.  We understand that everyone responds to pain differently and each individual patient will be evaluated on a regular basis for the continued need for narcotic medications. Ideally, narcotic medication use should last no more than 6-8 weeks (coinciding with fracture healing).   As a patient it is your responsibility as well to monitor narcotic medication use and report the amount and frequency you use these medications when you come to your office visit.   We would also advise that if you are using narcotic medications, you should take a dose prior to therapy to maximize you participation.  IF YOU ARE ON NARCOTIC MEDICATIONS IT IS NOT PERMISSIBLE TO OPERATE A MOTOR VEHICLE (MOTORCYCLE/CAR/TRUCK/MOPED) OR HEAVY MACHINERY DO NOT MIX NARCOTICS WITH OTHER CNS (CENTRAL NERVOUS SYSTEM) DEPRESSANTS SUCH AS ALCOHOL  Diet: as you were eating previously.  Can use over the counter stool softeners and bowel preparations, such as Miralax, to help with bowel movements.  Narcotics can be constipating.  Be sure to drink plenty of fluids    STOP SMOKING OR USING NICOTINE PRODUCTS!!!!  As discussed nicotine severely impairs your body's ability to heal surgical and traumatic wounds but also impairs bone healing.  Wounds and bone heal by forming microscopic blood vessels (angiogenesis) and nicotine is a vasoconstrictor (essentially,  shrinks blood vessels).  Therefore, if vasoconstriction occurs to these microscopic blood vessels they essentially disappear and are unable to deliver necessary nutrients to the healing tissue.  This is one modifiable factor that you can do to dramatically increase your chances of healing your injury.    (This means no smoking, no nicotine gum, patches, etc)  DO NOT USE NONSTEROIDAL ANTI-INFLAMMATORY DRUGS (NSAID'S)  Using products such as Advil (ibuprofen), Aleve (naproxen), Motrin (ibuprofen) for additional pain control during fracture healing can delay and/or prevent the healing response.  If you would like to take over the counter (OTC) medication, Tylenol (acetaminophen) is ok.  However, some narcotic medications that are given for pain control contain acetaminophen as well. Therefore, you should not exceed more than 4000 mg of tylenol in a day if you do not have liver disease.  Also note that there are may OTC medicines, such as cold medicines and allergy medicines that my contain tylenol as well.  If you have any questions about medications and/or interactions please ask your doctor/PA or your pharmacist.      ICE AND ELEVATE INJURED/OPERATIVE EXTREMITY  Using ice and elevating the injured extremity above your heart can help with swelling and pain control.  Icing in a pulsatile fashion, such as 20 minutes on and 20 minutes off, can be followed.    Do not place ice directly on skin. Make sure there is a barrier between to skin and the ice pack.    Using frozen items such as frozen peas works well as the conform nicely to the are that needs to be iced.  USE AN ACE WRAP OR TED HOSE FOR SWELLING CONTROL  In addition to icing and elevation, Ace wraps or TED hose are used to help limit and resolve swelling.  It is recommended to use Ace wraps or TED hose until you are informed to stop.    When using Ace Wraps start the wrapping distally (farthest away from the body) and wrap proximally (closer to the  body)   Example: If you had surgery on your leg or thing and you do not have a splint on, start the ace wrap at the toes and work your way up to the thigh        If you had surgery on your upper extremity and do not have a splint on, start the ace wrap at your fingers and work your way up to the upper arm  IF YOU ARE IN A SPLINT OR CAST DO NOT REMOVE IT FOR ANY REASON   If your splint gets wet for any reason please contact the office immediately. You may shower in your splint or cast as long as you keep it dry.  This can be done by wrapping in a cast cover or garbage back (or similar)  Do Not stick any thing down your splint or cast such as pencils, money, or hangers to try and scratch yourself with.  If you feel itchy take benadryl as prescribed on the bottle for itching  IF YOU ARE IN A CAM BOOT (BLACK BOOT)  You may remove boot periodically. Perform daily dressing changes as noted below.  Wash the liner of the boot regularly and wear a sock when wearing the boot. It is recommended that you sleep in the boot until told otherwise  CALL THE OFFICE WITH ANY QUESTIONS OR CONCERTS: [phone]     Novolog correction scale is.    insulin aspart (novoLOG) injection      CBG < 70: implement hypoglycemia protocol CBG 70 - 120: 0 units CBG 121 - 150: 2 units CBG 151 - 200: 3 units CBG 201 - 250: 5 units CBG 251 - 300: 8 units CBG 301 - 350: 11 units CBG 351 - 400: 15 units CBG > 400: call MD and obtain STAT lab verification     Novolog correction scale bedtime         Order Questions       Question Answer Comment      Correction coverage: HS scale       CBG < 70: implement hypoglycemia protocol       CBG 70 - 120: 0 units       CBG 121 - 150: 0 units       CBG 151 - 200: 0 units       CBG 201 - 250: 2 units       CBG 251 - 300: 3 units       CBG 301 - 350: 4 units       CBG 351 - 400: 5 units       CBG > 400 call MD and obtain STAT lab verification
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
## 2015-11-14 NOTE — Progress Notes (Signed)
Call received from PA for Diabetes Coordinator to review Novolog correction scale with patient as ordered in the hospital for home use.   Novolog correction scale is.  insulin aspart (novoLOG) injection   CBG < 70: implement hypoglycemia protocol CBG 70 - 120: 0 units CBG 121 - 150: 2 units CBG 151 - 200: 3 units CBG 201 - 250: 5 units CBG 251 - 300: 8 units CBG 301 - 350: 11 units CBG 351 - 400: 15 units CBG > 400: call MD and obtain STAT lab verification   Please check blood sugar before each meal.  Inject Novolog per above scale subcutaneously three times a day. Discussed with patient and he verbalized understanding.  He is to follow-up with PCP in the next two weeks and keep a log of blood sugars before meals and and bedtime along with insulin doses.   Patient was able to teach back appropriately proper monitoring and doses based on blood sugar results.  Left copy of correction scale on patient's chart to be given at discharge. Discussed with RN.  Thanks, Beryl MeagerJenny Sterling Mondo, RN, BC-ADM Inpatient Diabetes Coordinator Pager [phone])
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
## 2015-11-17 LAB — TESTOSTERONE, % FREE: Testosterone-% Free: 1.1 % — ABNORMAL LOW (ref 0.2–0.7)

## 2015-11-26 ENCOUNTER — Other Ambulatory Visit (HOSPITAL_COMMUNITY): Payer: Self-pay | Admitting: Orthopedic Surgery

## 2015-11-27 ENCOUNTER — Encounter (HOSPITAL_COMMUNITY): Payer: Self-pay | Admitting: *Deleted

## 2015-11-27 NOTE — Progress Notes (Signed)
I instructed patient to check CBG to check CBG and if it is less than 70 to treat it with Glucose Gel, Glucose tablets or 1/2 cup of clear juice like apple juice or cranberry juice, or 1/2 cup of regular soda. (not cream soda). I instructed patient to recheck CBG in 15 minutes and if CBG is not greater than 70, to  Call [phone] (pre- op). If it is before pre-op opens to retreat as before and recheck CBG in 15 minutes. I told patient to make note of time that liquid is taken and amount, that surgical time may have to be adjusted.  

## 2015-11-28 ENCOUNTER — Emergency Department (HOSPITAL_COMMUNITY)
Admission: EM | Admit: 2015-11-28 | Discharge: 2015-11-28 | Disposition: A | Discharge status: Home / Self Care | Payer: Medicaid Other | Attending: Emergency Medicine | Admitting: Emergency Medicine

## 2015-11-28 ENCOUNTER — Ambulatory Visit (HOSPITAL_COMMUNITY)
Admission: RE | Admit: 2015-11-28 | Discharge: 2015-11-28 | Disposition: A | Discharge status: Home / Self Care | Payer: Medicaid Other | Source: Ambulatory Visit | Attending: Orthopedic Surgery | Admitting: Orthopedic Surgery

## 2015-11-28 ENCOUNTER — Encounter (HOSPITAL_COMMUNITY): Payer: Self-pay | Admitting: *Deleted

## 2015-11-28 ENCOUNTER — Ambulatory Visit (HOSPITAL_COMMUNITY): Payer: Medicaid Other | Admitting: Certified Registered Nurse Anesthetist

## 2015-11-28 ENCOUNTER — Encounter (HOSPITAL_COMMUNITY)
Admission: RE | Disposition: A | Discharge status: Home / Self Care | Payer: Self-pay | Source: Ambulatory Visit | Attending: Orthopedic Surgery

## 2015-11-28 ENCOUNTER — Encounter (HOSPITAL_COMMUNITY): Payer: Self-pay

## 2015-11-28 DIAGNOSIS — Z794 Long term (current) use of insulin: Secondary | ICD-10-CM | POA: Diagnosis not present

## 2015-11-28 DIAGNOSIS — R5383 Other fatigue: Secondary | ICD-10-CM | POA: Diagnosis not present

## 2015-11-28 DIAGNOSIS — M96830 Postprocedural hemorrhage and hematoma of a musculoskeletal structure following a musculoskeletal system procedure: Secondary | ICD-10-CM | POA: Diagnosis not present

## 2015-11-28 DIAGNOSIS — Z7982 Long term (current) use of aspirin: Secondary | ICD-10-CM | POA: Insufficient documentation

## 2015-11-28 DIAGNOSIS — K219 Gastro-esophageal reflux disease without esophagitis: Secondary | ICD-10-CM | POA: Insufficient documentation

## 2015-11-28 DIAGNOSIS — E1140 Type 2 diabetes mellitus with diabetic neuropathy, unspecified: Secondary | ICD-10-CM | POA: Diagnosis not present

## 2015-11-28 DIAGNOSIS — Z7984 Long term (current) use of oral hypoglycemic drugs: Secondary | ICD-10-CM | POA: Diagnosis not present

## 2015-11-28 DIAGNOSIS — E1142 Type 2 diabetes mellitus with diabetic polyneuropathy: Secondary | ICD-10-CM | POA: Diagnosis not present

## 2015-11-28 DIAGNOSIS — Z8781 Personal history of (healed) traumatic fracture: Secondary | ICD-10-CM | POA: Diagnosis not present

## 2015-11-28 DIAGNOSIS — Z792 Long term (current) use of antibiotics: Secondary | ICD-10-CM | POA: Insufficient documentation

## 2015-11-28 DIAGNOSIS — M868X7 Other osteomyelitis, ankle and foot: Secondary | ICD-10-CM | POA: Diagnosis not present

## 2015-11-28 DIAGNOSIS — M86172 Other acute osteomyelitis, left ankle and foot: Secondary | ICD-10-CM

## 2015-11-28 DIAGNOSIS — E559 Vitamin D deficiency, unspecified: Secondary | ICD-10-CM | POA: Diagnosis not present

## 2015-11-28 HISTORY — PX: TOE AMPUTATION: SHX809

## 2015-11-28 HISTORY — DX: Reserved for inherently not codable concepts without codable children: IMO0001

## 2015-11-28 HISTORY — PX: AMPUTATION: SHX166

## 2015-11-28 LAB — CBC
HCT: 36.3 % — ABNORMAL LOW (ref 39.0–52.0)
HEMATOCRIT: 38.8 % — AB (ref 39.0–52.0)
HEMOGLOBIN: 13.1 g/dL (ref 13.0–17.0)
Hemoglobin: 12.4 g/dL — ABNORMAL LOW (ref 13.0–17.0)
MCH: 29.7 pg (ref 26.0–34.0)
MCH: 29.9 pg (ref 26.0–34.0)
MCHC: 33.8 g/dL (ref 30.0–36.0)
MCHC: 34.2 g/dL (ref 30.0–36.0)
MCV: 88 fL (ref 78.0–100.0)
MCV: 87.5 fL (ref 78.0–100.0)
Platelets: 300 10*3/uL (ref 150–400)
Platelets: 319 10*3/uL (ref 150–400)
RBC: 4.41 MIL/uL (ref 4.22–5.81)
RBC: 4.15 MIL/uL — ABNORMAL LOW (ref 4.22–5.81)
RDW: 12.8 % (ref 11.5–15.5)
RDW: 12.8 % (ref 11.5–15.5)
WBC: 9.4 10*3/uL (ref 4.0–10.5)
WBC: 10.7 10*3/uL — AB (ref 4.0–10.5)

## 2015-11-28 LAB — CBG MONITORING, ED: GLUCOSE-CAPILLARY: 229 mg/dL — AB (ref 65–99)

## 2015-11-28 LAB — COMPREHENSIVE METABOLIC PANEL
ALK PHOS: 69 U/L (ref 38–126)
ALT: 17 U/L (ref 17–63)
AST: 17 U/L (ref 15–41)
Albumin: 3.9 g/dL (ref 3.5–5.0)
Anion gap: 10 (ref 5–15)
BILIRUBIN TOTAL: 1.4 mg/dL — AB (ref 0.3–1.2)
BUN: 15 mg/dL (ref 6–20)
CALCIUM: 9.6 mg/dL (ref 8.9–10.3)
CO2: 25 mmol/L (ref 22–32)
CREATININE: 0.55 mg/dL — AB (ref 0.61–1.24)
Chloride: 102 mmol/L (ref 101–111)
GFR calc non Af Amer: >60 mL/min (ref >60)
GLUCOSE: 171 mg/dL — AB (ref 65–99)
Potassium: 3.8 mmol/L (ref 3.5–5.1)
SODIUM: 137 mmol/L (ref 135–145)
TOTAL PROTEIN: 6.7 g/dL (ref 6.5–8.1)

## 2015-11-28 LAB — GLUCOSE, CAPILLARY
GLUCOSE-CAPILLARY: 157 mg/dL — AB (ref 65–99)
Glucose-Capillary: 180 mg/dL — ABNORMAL HIGH (ref 65–99)

## 2015-11-28 LAB — APTT: aPTT: 32 seconds (ref 24–37)

## 2015-11-28 LAB — PROTIME-INR
INR: 1.16 (ref 0.00–1.49)
INR: 1.19 (ref 0.00–1.49)
PROTHROMBIN TIME: 15.3 s — AB (ref 11.6–15.2)
Prothrombin Time: 15 seconds (ref 11.6–15.2)

## 2015-11-28 SURGERY — AMPUTATION, FOOT, RAY
Anesthesia: General | Laterality: Left

## 2015-11-28 MED ORDER — FENTANYL CITRATE (PF) 100 MCG/2ML IJ SOLN
INTRAMUSCULAR | Status: DC | PRN
  Administered 2015-11-28: 25 ug via INTRAVENOUS
  Administered 2015-11-28: 50 ug via INTRAVENOUS
  Administered 2015-11-28: 25 ug via INTRAVENOUS

## 2015-11-28 MED ORDER — PHENYLEPHRINE HCL 10 MG/ML IJ SOLN
INTRAMUSCULAR | Status: DC | PRN
  Administered 2015-11-28 (×2): 80 ug via INTRAVENOUS

## 2015-11-28 MED ORDER — ONDANSETRON 4 MG PO TBDP
8.0000 mg | ORAL_TABLET | Freq: Once | ORAL | Status: AC
  Administered 2015-11-28: 8 mg via ORAL
  Filled 2015-11-28: qty 2

## 2015-11-28 MED ORDER — FENTANYL CITRATE (PF) 250 MCG/5ML IJ SOLN
INTRAMUSCULAR | Status: AC
  Filled 2015-11-28: qty 5

## 2015-11-28 MED ORDER — HYDROMORPHONE HCL 1 MG/ML IJ SOLN: 0.2500 mg | INTRAMUSCULAR | Status: DC | PRN

## 2015-11-28 MED ORDER — ONDANSETRON HCL 4 MG/2ML IJ SOLN
INTRAMUSCULAR | Status: DC | PRN
  Administered 2015-11-28: 4 mg via INTRAVENOUS

## 2015-11-28 MED ORDER — MIDAZOLAM HCL 5 MG/5ML IJ SOLN
INTRAMUSCULAR | Status: DC | PRN
  Administered 2015-11-28: 2 mg via INTRAVENOUS

## 2015-11-28 MED ORDER — MIDAZOLAM HCL 2 MG/2ML IJ SOLN
INTRAMUSCULAR | Status: AC
  Filled 2015-11-28: qty 2

## 2015-11-28 MED ORDER — INSULIN ASPART 100 UNIT/ML ~~LOC~~ SOLN
5.0000 [IU] | Freq: Once | SUBCUTANEOUS | Status: AC
  Administered 2015-11-28: 5 [IU] via SUBCUTANEOUS
  Filled 2015-11-28: qty 1

## 2015-11-28 MED ORDER — PROPOFOL 10 MG/ML IV BOLUS
INTRAVENOUS | Status: AC
  Filled 2015-11-28: qty 40

## 2015-11-28 MED ORDER — CEFAZOLIN SODIUM-DEXTROSE 2-3 GM-% IV SOLR
2.0000 g | INTRAVENOUS | Status: AC
  Administered 2015-11-28: 2 g via INTRAVENOUS
  Filled 2015-11-28: qty 50

## 2015-11-28 MED ORDER — ONDANSETRON HCL 4 MG/2ML IJ SOLN
INTRAMUSCULAR | Status: AC
  Filled 2015-11-28: qty 2

## 2015-11-28 MED ORDER — LACTATED RINGERS IV SOLN
INTRAVENOUS | Status: DC
  Administered 2015-11-28 (×2): via INTRAVENOUS

## 2015-11-28 MED ORDER — CHLORHEXIDINE GLUCONATE 4 % EX LIQD: 60.0000 mL | Freq: Once | CUTANEOUS | Status: DC

## 2015-11-28 MED ORDER — ARTIFICIAL TEARS OP OINT
TOPICAL_OINTMENT | OPHTHALMIC | Status: AC
  Filled 2015-11-28: qty 3.5

## 2015-11-28 MED ORDER — HYDROCODONE-ACETAMINOPHEN 5-325 MG PO TABS: 1.0000 | ORAL_TABLET | Freq: Four times a day (QID) | ORAL | Status: DC | PRN

## 2015-11-28 MED ORDER — MEPERIDINE HCL 25 MG/ML IJ SOLN
6.2500 mg | INTRAMUSCULAR | Status: DC | PRN
Start: 2015-11-28 — End: 2015-11-28

## 2015-11-28 MED ORDER — 0.9 % SODIUM CHLORIDE (POUR BTL) OPTIME
TOPICAL | Status: DC | PRN
  Administered 2015-11-28: 1000 mL

## 2015-11-28 MED ORDER — ONDANSETRON HCL 4 MG/2ML IJ SOLN
4.0000 mg | Freq: Once | INTRAMUSCULAR | Status: AC | PRN
  Administered 2015-11-28: 4 mg via INTRAVENOUS

## 2015-11-28 SURGICAL SUPPLY — 30 items
BLADE SAW SGTL MED 73X18.5 STR (BLADE) ×2 IMPLANT
BNDG COHESIVE 4X5 TAN STRL (GAUZE/BANDAGES/DRESSINGS) ×2 IMPLANT
BNDG GAUZE ELAST 4 BULKY (GAUZE/BANDAGES/DRESSINGS) ×2 IMPLANT
COVER SURGICAL LIGHT HANDLE (MISCELLANEOUS) ×2 IMPLANT
DRAPE U-SHAPE 47X51 STRL (DRAPES) ×4 IMPLANT
DRSG ADAPTIC 3X8 NADH LF (GAUZE/BANDAGES/DRESSINGS) ×2 IMPLANT
DRSG PAD ABDOMINAL 8X10 ST (GAUZE/BANDAGES/DRESSINGS) ×4 IMPLANT
DURAPREP 26ML APPLICATOR (WOUND CARE) ×2 IMPLANT
ELECT REM PT RETURN 9FT ADLT (ELECTROSURGICAL) ×2
ELECTRODE REM PT RTRN 9FT ADLT (ELECTROSURGICAL) ×1 IMPLANT
GAUZE SPONGE 4X4 12PLY STRL (GAUZE/BANDAGES/DRESSINGS) ×2 IMPLANT
GLOVE BIOGEL PI IND STRL 9 (GLOVE) ×1 IMPLANT
GLOVE BIOGEL PI INDICATOR 9 (GLOVE) ×1
GLOVE SURG ORTHO 9.0 STRL STRW (GLOVE) ×2 IMPLANT
GOWN STRL REUS W/ TWL LRG LVL3 (GOWN DISPOSABLE) ×1 IMPLANT
GOWN STRL REUS W/ TWL XL LVL3 (GOWN DISPOSABLE) ×2 IMPLANT
GOWN STRL REUS W/TWL LRG LVL3 (GOWN DISPOSABLE) ×1
GOWN STRL REUS W/TWL XL LVL3 (GOWN DISPOSABLE) ×2
KIT BASIN OR (CUSTOM PROCEDURE TRAY) ×2 IMPLANT
KIT ROOM TURNOVER OR (KITS) ×2 IMPLANT
NS IRRIG 1000ML POUR BTL (IV SOLUTION) ×2 IMPLANT
PACK ORTHO EXTREMITY (CUSTOM PROCEDURE TRAY) ×2 IMPLANT
PAD ARMBOARD 7.5X6 YLW CONV (MISCELLANEOUS) ×2 IMPLANT
SPONGE LAP 18X18 X RAY DECT (DISPOSABLE) ×2 IMPLANT
STOCKINETTE IMPERVIOUS LG (DRAPES) IMPLANT
SUT ETHILON 2 0 PSLX (SUTURE) ×4 IMPLANT
TOWEL OR 17X24 6PK STRL BLUE (TOWEL DISPOSABLE) ×2 IMPLANT
TOWEL OR 17X26 10 PK STRL BLUE (TOWEL DISPOSABLE) ×2 IMPLANT
UNDERPAD 30X30 INCONTINENT (UNDERPADS AND DIAPERS) ×2 IMPLANT
WATER STERILE IRR 1000ML POUR (IV SOLUTION) IMPLANT

## 2015-11-28 NOTE — ED Notes (Signed)
MD Smith at the bedside  

## 2015-11-28 NOTE — Op Note (Signed)
11/28/2015  1:50 PM  PATIENT:  Travis [name]    PRE-OPERATIVE DIAGNOSIS:  Osteomyelitis Left 4th Metatarsal   POST-OPERATIVE DIAGNOSIS:  Same  PROCEDURE:  Left 4th Ray Amputation Local tissue rearrangement for wound closure 7 x 3 cm  SURGEON:  Nadara MustardUDA,MARCUS V, MD  PHYSICIAN ASSISTANT:None ANESTHESIA:   General  PREOPERATIVE INDICATIONS:  Travis [name] is a  44 y.o. male with a diagnosis of Osteomyelitis Left 4th Metatarsal  who failed conservative measures and elected for surgical management.    The risks benefits and alternatives were discussed with the patient preoperatively including but not limited to the risks of infection, bleeding, nerve injury, cardiopulmonary complications, the need for revision surgery, among others, and the patient was willing to proceed.  OPERATIVE IMPLANTS: None  OPERATIVE FINDINGS: Good petechial bleeding no deep abscess  OPERATIVE PROCEDURE: Patient was brought the operating room and underwent a general anesthetic. After adequate levels of anesthesia were obtained patient's left lower extremity was prepped using DuraPrep draped into a sterile field. A timeout was called. A V incision was made around the fourth ray to include the plantar ulcer fourth digit and the fourth metatarsal was resected through the base. Electrocautery was used for hemostasis the wound was irrigated with normal saline. Local tissue rearrangement was used to close a wound 3 x 7 cm. The wound was then covered with a sterile compressive dressing patient was extubated taken to the PACU in stable condition.

## 2015-11-28 NOTE — ED Notes (Signed)
Called Dr. Jeraldine LootsLockwood and reported patient requested 5 units humalog since he just ate. Reported cbg of 229. MD acknowledges, gives verbal order for 5 units of humalog.

## 2015-11-28 NOTE — ED Notes (Signed)
Dr. Jeraldine LootsLockwood allows patient to transfer home via ptar due to foot and knee problems. Inquired if home care would be needed, but family declines at this time. Informed that ETA isn't provided. Address for destination is [phone] [place], [place], [id number].

## 2015-11-28 NOTE — ED Notes (Signed)
PTAR called @ 2311

## 2015-11-28 NOTE — ED Notes (Signed)
Pt placed in gown.

## 2015-11-28 NOTE — ED Notes (Signed)
Went to change dressing on left foot, and the family requests to leave combat gauze on. When inquired about possible concerns, family describes how discharge seems inappropriate at this time. They have already been informed by resident that supervising Md will come to speak with family.

## 2015-11-28 NOTE — Transfer of Care (Signed)
Immediate Anesthesia Transfer of Care Note  Patient: Travis [name]  Procedure(s) Performed: Procedure(s): Left 4th Ray Amputation (Left)  Patient Location: PACU  Anesthesia Type:General  Level of Consciousness: awake, alert  and oriented  Airway & Oxygen Therapy: Patient Spontanous Breathing and Patient connected to nasal cannula oxygen  Post-op Assessment: Report given to RN and Post -op Vital signs reviewed and stable  Post vital signs: Reviewed and stable  Last Vitals:  Filed Vitals:   11/28/15 1105 11/28/15 1400  BP: 131/77   Pulse: 80   Temp: 36.2 C 36.7 C  Resp: 18     Complications: No apparent anesthesia complications

## 2015-11-28 NOTE — ED Notes (Signed)
Dr. Jeraldine LootsLockwood now at the bedside.

## 2015-11-28 NOTE — Progress Notes (Signed)
Orthopedic Tech Progress Note Patient Details:  Katha CabalMichael T Slotnick 06/02/1971 [id number]  Ortho Devices Type of Ortho Device: Postop shoe/boot Ortho Device/Splint Location: lle Ortho Device/Splint Interventions: Application Viewed order from RN order list  Nikki DomCrawford, Itzel Mckibbin 11/28/2015, 3:54 PM

## 2015-11-28 NOTE — H&P (Signed)
Travis CabalMichael T [name] is an 44 y.o. male.   Chief Complaint: Osteomyelitis ulceration left foot fourth toe HPI: Patient is a 44 year old gentleman with diabetic insensate neuropathy peripheral vascular disease and osteomyelitis fourth toe  Past Medical History  Diagnosis Date  . Peripheral neuropathy (HCC)   . Acute osteomyelitis of metatarsal bone of left foot (HCC) 11/12/2015  . Diabetic peripheral neuropathy associated with type 2 diabetes mellitus (HCC) 08/30/2008    Qualifier: Diagnosis of  By: Daphine DeutscherMartin FNP, Zena AmosNykedtra    . Closed fracture of right tibial plateau 11/11/2015  . Vitamin D deficiency 11/12/2015  . Osteoporosis 11/12/2015  . GERD (gastroesophageal reflux disease)   . Diabetes mellitus     INSULIN DEPENDENT Type II  . Diabetic ulcer of left foot (HCC) 11/14/2015  . Shortness of breath dyspnea     with exertion    Past Surgical History  Procedure Laterality Date  . Knee surgery Left   . Shoulder surgery Right     Family History  Problem Relation Age of Onset  . Diabetes Mother   . Cancer Father   . Diabetes Sister   . Diabetes Maternal Uncle   . Diabetes Maternal Grandmother   . Cancer Paternal Grandmother    Social History:  reports that he has never smoked. He has never used smokeless tobacco. He reports that he does not drink alcohol or use illicit drugs.  Allergies: Not on File  No prescriptions prior to admission    No results found for this or any previous visit (from the past 48 hour(s)). No results found.  Review of Systems  All other systems reviewed and are negative.   There were no vitals taken for this visit. Physical Exam  On examination patient has ulceration osteomyelitis fourth toe left foot Assessment/Plan Assessment: Left foot fourth toe osteomyelitis.  Plan: We'll plan for left foot fourth Ray amputation. Risks and benefits were discussed including risk of the wound not healing. Patient states he understands wishes to proceed at this  time.  Elane Peabody V 11/28/2015, 6:41 AM

## 2015-11-28 NOTE — ED Provider Notes (Signed)
CSN: [id number]     Arrival date & time 11/28/15  1750 History   First MD Initiated Contact with Patient 11/28/15 1755     Chief Complaint  Patient presents with  . Post-op Problem    Patient complains of bleeding from left foot after surgery today. Endorse that it was bleeding after surgery and soaked gauze for leaving the hospital. Since being home the gauze is thoroughly soaked through and patient says he is getting blood everywhere. Denies any fevers chills swelling purulent discharge.  Patient is a 44 y.o. male presenting with foot injury.  Foot Injury Location:  Foot (post op) Lower extremity injury: s/p toe amputation, now bleeding too much.   Foot location:  L foot Pain details:    Onset quality:  Sudden   Timing:  Constant   Progression:  Worsening Chronicity:  New Prior injury to area:  No Relieved by:  Nothing Exacerbated by: putting leg down. Ineffective treatments:  Compression and immobilization Associated symptoms: fatigue     Past Medical History  Diagnosis Date  . Peripheral neuropathy (HCC)   . Acute osteomyelitis of metatarsal bone of left foot (HCC) 11/12/2015  . Diabetic peripheral neuropathy associated with type 2 diabetes mellitus (HCC) 08/30/2008    Qualifier: Diagnosis of  By: Daphine Deutscher FNP, Zena Amos    . Closed fracture of right tibial plateau 11/11/2015  . Vitamin D deficiency 11/12/2015  . Osteoporosis 11/12/2015  . GERD (gastroesophageal reflux disease)   . Diabetes mellitus     INSULIN DEPENDENT Type II  . Diabetic ulcer of left foot (HCC) 11/14/2015  . Shortness of breath dyspnea     with exertion   Past Surgical History  Procedure Laterality Date  . Knee surgery Left   . Shoulder surgery Right    Family History  Problem Relation Age of Onset  . Diabetes Mother   . Cancer Father   . Diabetes Sister   . Diabetes Maternal Uncle   . Diabetes Maternal Grandmother   . Cancer Paternal Grandmother    Social History  Substance Use Topics  .  Smoking status: Never Smoker   . Smokeless tobacco: Never Used  . Alcohol Use: No    Review of Systems  Constitutional: Positive for fatigue.  Respiratory: Negative for shortness of breath and wheezing.   Gastrointestinal: Negative for nausea and vomiting.  Skin: Positive for wound (surgery on left foot, bleeding now).  Neurological: Negative for dizziness.  All other systems reviewed and are negative.     Allergies  Review of patient's allergies indicates no known allergies.  Home Medications   Prior to Admission medications   Medication Sig Start Date End Date Taking? Authorizing Provider  aspirin 325 MG tablet Take 1 tablet (325 mg total) by mouth daily. 11/14/15  Yes Montez Morita, PA-C  doxycycline (VIBRAMYCIN) 50 MG capsule Take 2 capsules (100 mg total) by mouth 2 (two) times daily. 11/14/15  Yes Montez Morita, PA-C  insulin aspart (NOVOLOG) 100 UNIT/ML FlexPen Inject 0-15 Units into the skin 4 (four) times daily -  with meals and at bedtime. Dosing based on blood sugars taken before meals and bedtime. See chart 11/14/15  Yes Montez Morita, PA-C  insulin detemir (LEVEMIR) 100 UNIT/ML injection Inject 0.2 mLs (20 Units total) into the skin at bedtime. 11/04/15  Yes [name], FNP  metFORMIN (GLUCOPHAGE) 500 MG tablet Take 1 tablet (500 mg total) by mouth 2 (two) times daily with a meal. 11/03/15  Yes [name], FNP  methocarbamol (ROBAXIN) 500 MG tablet Take 1-2 tablets (500-1,000 mg total) by mouth every 6 (six) hours as needed for muscle spasms. 11/14/15  Yes Montez Morita, PA-C  omeprazole (PRILOSEC) 20 MG capsule Take 1 capsule (20 mg total) by mouth daily. 11/03/15  Yes [name], FNP  vitamin C (VITAMIN C) 500 MG tablet Take 1 tablet (500 mg total) by mouth daily. 11/14/15  Yes Montez Morita, PA-C  DULoxetine (CYMBALTA) 20 MG capsule Take 3 capsules (60 mg total) by mouth daily. Patient not taking: Reported on 11/28/2015 11/03/15   [name], FNP   HYDROcodone-acetaminophen (NORCO) 5-325 MG tablet Take 1 tablet by mouth every 6 (six) hours as needed. Patient not taking: Reported on 11/28/2015 11/28/15   Nadara Mustard, MD  oxyCODONE (OXY IR/ROXICODONE) 5 MG immediate release tablet Take 1-2 tablets (5-10 mg total) by mouth every 6 (six) hours as needed for breakthrough pain (take between percocet as needed for breakthrough  pain). Patient not taking: Reported on 11/28/2015 11/14/15   Montez Morita, PA-C  oxyCODONE-acetaminophen (PERCOCET/ROXICET) 5-325 MG tablet Take 1-2 tablets by mouth every 6 (six) hours as needed for severe pain. Patient not taking: Reported on 11/28/2015 11/14/15   Montez Morita, PA-C  Vitamin D, Ergocalciferol, (DRISDOL) 50000 UNITS CAPS capsule Take 1 capsule (50,000 Units total) by mouth every Wednesday. 11/14/15   Montez Morita, PA-C   BP 117/71 mmHg  Pulse 83  Temp(Src) 98.1 F (36.7 C) (Oral)  Resp 16  Ht  (1.88 m)  Wt 89.359 kg  BMI 25.28 kg/m2  SpO2 99% Physical Exam  Constitutional: He is oriented to person, place, and time. He appears well-developed and well-nourished. No distress.  HENT:  Head: Normocephalic and atraumatic.  Eyes: Pupils are equal, round, and reactive to light.  Neck: Normal range of motion.  Cardiovascular: Normal rate.   Abdominal: He exhibits no distension. There is no tenderness.  Musculoskeletal: Normal range of motion. He exhibits no tenderness.  Left foot s/p 4th toe amputation. Incision site clean, bloody. Bleeding from plantar surface incision site.   Neurological: He is alert and oriented to person, place, and time.  Skin: No rash noted. He is not diaphoretic.    ED Course  Procedures (including critical care time) Labs Review Labs Reviewed  CBC - Abnormal; Notable for the following:    WBC 10.7 (*)    RBC 4.15 (*)    Hemoglobin 12.4 (*)    HCT 36.3 (*)    All other components within normal limits  PROTIME-INR - Abnormal; Notable for the following:    Prothrombin  Time 15.3 (*)    All other components within normal limits    Imaging Review No results found. I have personally reviewed and evaluated these images and lab results as part of my medical decision-making.   EKG Interpretation None      MDM   Final diagnoses:  None   44 year old Caucasian male with history of DM, peripheral neuropathy, and recent left toe osteomyelitis status post fourth metatarsal amputation today by orthopedics who presents today with bleeding and bleeding. Patient reports steady bleeding from the site since being discharged. He feels a little bit fatigued. No syncope. He does not feel pain in the foot. He has soaked through his bandages. He denies fevers chills nausea vomiting diarrhea constipation swelling purulent discharge.  On exam in ED and VSS. Patient has bleeding from the proximal portion of the plantar surface of left foot from his incision/stitch site. Will  check CBC for hemoglobin drop and will apply impregnated combat gauze to the wound to stop the bleeding.   After gauze applied, bleeding stopped. Hb stable. Pt appears well. Stable for DC home. Encouraged pt to keep his foot elevated to keep bleeding at a minimum. FU to ortho or PCP next week for re-eval. Strict ED return if bleeding returns and cannot be controlled.    Pt was seen under the supervision of Dr. Jeraldine LootsLockwood.    Rachelle HoraKeri Cicilia Clinger, MD 11/28/15 [id number]  Gerhard Munchobert Lockwood, MD 11/29/15 Moses [id number]

## 2015-11-28 NOTE — Anesthesia Postprocedure Evaluation (Signed)
Anesthesia Post Note  Patient: Travis [name]  Procedure(s) Performed: Procedure(s) (LRB): Left 4th Ray Amputation (Left)  Patient location during evaluation: PACU Anesthesia Type: General Level of consciousness: awake and alert Pain management: pain level controlled Vital Signs Assessment: post-procedure vital signs reviewed and stable Respiratory status: spontaneous breathing, nonlabored ventilation, respiratory function stable and patient connected to nasal cannula oxygen Cardiovascular status: blood pressure returned to baseline and stable Postop Assessment: no signs of nausea or vomiting Anesthetic complications: no    Last Vitals:  Filed Vitals:   11/28/15 1459 11/28/15 1500  BP:  110/76  Pulse: 66 67  Temp:  36.2 C  Resp: 12     Last Pain:  Filed Vitals:   11/28/15 1529  PainSc: 0-No pain                 Merritt Kibby DAVID

## 2015-11-28 NOTE — Anesthesia Preprocedure Evaluation (Signed)
Anesthesia Evaluation  Patient identified by MRN, date of birth, ID band Patient awake    Reviewed: Allergy & Precautions, NPO status , Patient's Chart, lab work & pertinent test results  Airway Mallampati: I  TM Distance: >3 FB Neck ROM: Full    Dental   Pulmonary    Pulmonary exam normal        Cardiovascular Normal cardiovascular exam     Neuro/Psych    GI/Hepatic   Endo/Other  diabetes, Type 2, Oral Hypoglycemic Agents, Insulin Dependent  Renal/GU      Musculoskeletal   Abdominal   Peds  Hematology   Anesthesia Other Findings   Reproductive/Obstetrics                             Anesthesia Physical Anesthesia Plan  ASA: II  Anesthesia Plan: General   Post-op Pain Management:    Induction: Intravenous  Airway Management Planned: LMA  Additional Equipment:   Intra-op Plan:   Post-operative Plan: Extubation in OR  Informed Consent: I have reviewed the patients History and Physical, chart, labs and discussed the procedure including the risks, benefits and alternatives for the proposed anesthesia with the patient or authorized representative who has indicated his/her understanding and acceptance.     Plan Discussed with: CRNA and Surgeon  Anesthesia Plan Comments:         Anesthesia Quick Evaluation

## 2015-11-28 NOTE — ED Notes (Signed)
Per EMS, Pt is coming from home. Pt had surgery on his left fourth toe today at 1300. Pt had minimal bleeding after surgery and was sent home. Since being home, patient has had increased bleeding that is soaking through towel and dressing. Pt denies pain, but reports nausea. Vitals per EMS: 114/72, 96 HR, 14 RR, 250 CBG

## 2015-11-28 NOTE — Discharge Instructions (Signed)
Keep leg/foot elevated.

## 2015-11-28 NOTE — Anesthesia Procedure Notes (Signed)
Procedure Name: LMA Insertion Date/Time: 11/28/2015 1:25 PM Performed by: Margaree MackintoshYACOUB, Marlowe Cinquemani B Pre-anesthesia Checklist: Patient identified, Emergency Drugs available, Suction available, Patient being monitored and Timeout performed Patient Re-evaluated:Patient Re-evaluated prior to inductionOxygen Delivery Method: Circle system utilized Preoxygenation: Pre-oxygenation with 100% oxygen Intubation Type: IV induction LMA: LMA inserted LMA Size: 5.0 Number of attempts: 1 Placement Confirmation: breath sounds checked- equal and bilateral and positive ETCO2 Tube secured with: Tape Dental Injury: Teeth and Oropharynx as per pre-operative assessment

## 2015-11-28 NOTE — ED Notes (Signed)
ptar present. Called pharmacy, spoke to varonda. Patient is to continue to take aspirin to prevent clots as he is still high risk due to being immobile. Relayed to patient and family. They acknowledge.

## 2015-11-28 NOTE — ED Notes (Signed)
Provided extra wound cleaning supplies for the family while awaiting ptar transport.

## 2015-11-29 ENCOUNTER — Encounter (HOSPITAL_COMMUNITY): Payer: Self-pay

## 2015-11-29 ENCOUNTER — Observation Stay (HOSPITAL_COMMUNITY)
Admission: EM | Admit: 2015-11-29 | Discharge: 2015-11-30 | Disposition: A | Discharge status: Home / Self Care | Payer: Medicaid Other | Attending: Orthopaedic Surgery | Admitting: Orthopaedic Surgery

## 2015-11-29 ENCOUNTER — Emergency Department (HOSPITAL_COMMUNITY): Payer: Medicaid Other

## 2015-11-29 DIAGNOSIS — Z89422 Acquired absence of other left toe(s): Secondary | ICD-10-CM | POA: Diagnosis not present

## 2015-11-29 DIAGNOSIS — K219 Gastro-esophageal reflux disease without esophagitis: Secondary | ICD-10-CM | POA: Diagnosis not present

## 2015-11-29 DIAGNOSIS — E1142 Type 2 diabetes mellitus with diabetic polyneuropathy: Secondary | ICD-10-CM | POA: Diagnosis not present

## 2015-11-29 DIAGNOSIS — Z794 Long term (current) use of insulin: Secondary | ICD-10-CM | POA: Diagnosis not present

## 2015-11-29 DIAGNOSIS — M96830 Postprocedural hemorrhage and hematoma of a musculoskeletal structure following a musculoskeletal system procedure: Secondary | ICD-10-CM | POA: Diagnosis not present

## 2015-11-29 DIAGNOSIS — IMO0002 Reserved for concepts with insufficient information to code with codable children: Secondary | ICD-10-CM | POA: Diagnosis present

## 2015-11-29 LAB — BASIC METABOLIC PANEL
Anion gap: 7 (ref 5–15)
BUN: 14 mg/dL (ref 6–20)
CO2: 25 mmol/L (ref 22–32)
CREATININE: 0.63 mg/dL (ref 0.61–1.24)
Calcium: 9 mg/dL (ref 8.9–10.3)
Chloride: 102 mmol/L (ref 101–111)
GFR calc Af Amer: >60 mL/min (ref >60)
Glucose, Bld: 261 mg/dL — ABNORMAL HIGH (ref 65–99)
Potassium: 3.9 mmol/L (ref 3.5–5.1)
Sodium: 134 mmol/L — ABNORMAL LOW (ref 135–145)

## 2015-11-29 LAB — CBC
HCT: 31.5 % — ABNORMAL LOW (ref 39.0–52.0)
Hemoglobin: 10.8 g/dL — ABNORMAL LOW (ref 13.0–17.0)
MCH: 30.2 pg (ref 26.0–34.0)
MCHC: 34.3 g/dL (ref 30.0–36.0)
MCV: 88 fL (ref 78.0–100.0)
PLATELETS: 323 10*3/uL (ref 150–400)
RBC: 3.58 MIL/uL — ABNORMAL LOW (ref 4.22–5.81)
RDW: 12.8 % (ref 11.5–15.5)
WBC: 11.3 10*3/uL — AB (ref 4.0–10.5)

## 2015-11-29 LAB — I-STAT TROPONIN, ED
Troponin i, poc: 0.01 ng/mL (ref 0.00–0.08)
Troponin i, poc: 0 ng/mL (ref 0.00–0.08)

## 2015-11-29 LAB — GLUCOSE, CAPILLARY
GLUCOSE-CAPILLARY: 274 mg/dL — AB (ref 65–99)
GLUCOSE-CAPILLARY: 135 mg/dL — AB (ref 65–99)
Glucose-Capillary: 174 mg/dL — ABNORMAL HIGH (ref 65–99)

## 2015-11-29 LAB — HEMOGLOBIN AND HEMATOCRIT, BLOOD
HCT: 32.8 % — ABNORMAL LOW (ref 39.0–52.0)
HEMOGLOBIN: 11.5 g/dL — AB (ref 13.0–17.0)

## 2015-11-29 MED ORDER — METFORMIN HCL 500 MG PO TABS
500.0000 mg | ORAL_TABLET | Freq: Two times a day (BID) | ORAL | Status: DC
  Administered 2015-11-29 – 2015-11-30 (×2): 500 mg via ORAL
  Filled 2015-11-29 (×2): qty 1

## 2015-11-29 MED ORDER — INSULIN ASPART 100 UNIT/ML ~~LOC~~ SOLN
4.0000 [IU] | Freq: Three times a day (TID) | SUBCUTANEOUS | Status: DC
  Administered 2015-11-29 – 2015-11-30 (×3): 4 [IU] via SUBCUTANEOUS

## 2015-11-29 MED ORDER — TRANEXAMIC ACID 1000 MG/10ML IV SOLN
500.0000 mg | Freq: Once | INTRAVENOUS | Status: AC
  Administered 2015-11-29: 500 mg via TOPICAL
  Filled 2015-11-29: qty 10

## 2015-11-29 MED ORDER — HYDROCODONE-ACETAMINOPHEN 5-325 MG PO TABS
1.0000 | ORAL_TABLET | ORAL | Status: DC | PRN
  Administered 2015-11-29 (×2): 1 via ORAL
  Filled 2015-11-29 (×2): qty 1

## 2015-11-29 MED ORDER — PANTOPRAZOLE SODIUM 40 MG PO TBEC
40.0000 mg | DELAYED_RELEASE_TABLET | Freq: Every day | ORAL | Status: DC
  Administered 2015-11-29 – 2015-11-30 (×2): 40 mg via ORAL
  Filled 2015-11-29 (×2): qty 1

## 2015-11-29 MED ORDER — SODIUM CHLORIDE 0.9 % IV BOLUS (SEPSIS)
1000.0000 mL | Freq: Once | INTRAVENOUS | Status: AC
  Administered 2015-11-29: 1000 mL via INTRAVENOUS

## 2015-11-29 MED ORDER — INSULIN ASPART 100 UNIT/ML ~~LOC~~ SOLN
0.0000 [IU] | Freq: Three times a day (TID) | SUBCUTANEOUS | Status: DC
  Administered 2015-11-29: 2 [IU] via SUBCUTANEOUS
  Administered 2015-11-29: 8 [IU] via SUBCUTANEOUS
  Administered 2015-11-30 (×2): 2 [IU] via SUBCUTANEOUS

## 2015-11-29 MED ORDER — INSULIN GLARGINE 100 UNIT/ML ~~LOC~~ SOLN
20.0000 [IU] | Freq: Every day | SUBCUTANEOUS | Status: DC
  Administered 2015-11-29: 20 [IU] via SUBCUTANEOUS
  Filled 2015-11-29 (×2): qty 0.2

## 2015-11-29 NOTE — ED Notes (Signed)
Family at bedside. 

## 2015-11-29 NOTE — ED Notes (Signed)
Pts incision continues to bleed. Pt has saturated multiple 4X4 gauze and cling gauze. Dr. Elesa MassedWard notified.

## 2015-11-29 NOTE — Progress Notes (Signed)
Patient ID: Travis CabalMichael T Leever, male   DOB: 07/09/1971, 44 y.o.   MRN: [id number] Pt confortable. CBG's ordered. Lantus at night. Dressing dry . Foot elevated. Will try getting him up tomorrow.  On Tranexamic acid.

## 2015-11-29 NOTE — ED Notes (Signed)
Blood is visible through wrapping on pts foot.

## 2015-11-29 NOTE — Progress Notes (Signed)
Patient may eat.  Dressing is c/d/i.  Continue to monitor.  Bedrest for today.  Elevate LLE.  If dressing is dry tomorrow am, may dc in am.  Patient agrees to plan.  Travis [name]. [name], MD [facility] [phone] 10:03 AM

## 2015-11-29 NOTE — ED Notes (Signed)
Pt now complaining of "indigestion"

## 2015-11-29 NOTE — ED Provider Notes (Signed)
By signing my name below, I, Phillis HaggisGabriella Gaje, attest that this documentation has been prepared under the direction and in the presence of Kristen N Ward, DO. Electronically Signed: Phillis HaggisGabriella Gaje, ED Scribe. 11/29/2015. 2:30 AM.  TIME SEEN: 2:08 AM  CHIEF COMPLAINT: bleeding of left foot  HPI:  HPI Comments: Travis [name] is a 44 y.o. Male with a hx of insulin-dependent diabetes, peripheral neuropathy and acute osteomyelitis of metatarsal bone of left foot was resected by Dr. Lajoyce Cornersuda on 11/28/15 [facility] who presents to the Emergency Department complaining of left foot bleeding. Pt's left 4th toe was amputated on 11/28/15. Pt was seen earlier today in the ED for bleeding from the surgical incision and was sent home with wound re-dressing. Bleeding stopped prior to discharge with hemostatic gauze. He states that the foot continues to bleed through his dressings, worsening when the foot is not elevated. Pt currently takes 325 mg aspirin and Doxycycline.  Pt reports "indigestion" and chest pressure that has been intermittent and has since resolved. He reports that this is a problem he has been having for years. He denies worsening or alleviating factors, diaphoresis, nausea or vomiting, shortness of breath. Pt denies hx of cardiac disease, HTN, high cholesterol, bleeding disorders.    Note, patient's sister is very upset. She has threatened to sue multiple members of the hospital. She demands that he be admitted to the hospital. States that she has are spoke to Dr. Ophelia CharterYates and refuses to see him.   ROS: See HPI Constitutional: no fever  Eyes: no drainage  ENT: no runny nose   Cardiovascular:  no chest pain  Resp: no SOB  GI: no vomiting GU: no dysuria Integumentary: no rash  Allergy: no hives  Musculoskeletal: no leg swelling  Neurological: no slurred speech ROS otherwise negative  PAST MEDICAL HISTORY/PAST SURGICAL HISTORY:  Past Medical History  Diagnosis Date  . Peripheral  neuropathy (HCC)   . Acute osteomyelitis of metatarsal bone of left foot (HCC) 11/12/2015  . Diabetic peripheral neuropathy associated with type 2 diabetes mellitus (HCC) 08/30/2008    Qualifier: Diagnosis of  By: Daphine DeutscherMartin FNP, Zena AmosNykedtra    . Closed fracture of right tibial plateau 11/11/2015  . Vitamin D deficiency 11/12/2015  . Osteoporosis 11/12/2015  . GERD (gastroesophageal reflux disease)   . Diabetes mellitus     INSULIN DEPENDENT Type II  . Diabetic ulcer of left foot (HCC) 11/14/2015  . Shortness of breath dyspnea     with exertion    MEDICATIONS:  Prior to Admission medications   Medication Sig Start Date End Date Taking? Authorizing Provider  aspirin 325 MG tablet Take 1 tablet (325 mg total) by mouth daily. 11/14/15   Montez MoritaKeith Paul, PA-C  doxycycline (VIBRAMYCIN) 50 MG capsule Take 2 capsules (100 mg total) by mouth 2 (two) times daily. 11/14/15   Montez MoritaKeith Paul, PA-C  DULoxetine (CYMBALTA) 20 MG capsule Take 3 capsules (60 mg total) by mouth daily. Patient not taking: Reported on 11/28/2015 11/03/15   Massie MaroonLachina M Hollis, FNP  HYDROcodone-acetaminophen (NORCO) 5-325 MG tablet Take 1 tablet by mouth every 6 (six) hours as needed. Patient not taking: Reported on 11/28/2015 11/28/15   Nadara MustardMarcus Duda V, MD  insulin aspart (NOVOLOG) 100 UNIT/ML FlexPen Inject 0-15 Units into the skin 4 (four) times daily -  with meals and at bedtime. Dosing based on blood sugars taken before meals and bedtime. See chart 11/14/15   Montez MoritaKeith Paul, PA-C  insulin detemir (LEVEMIR) 100 UNIT/ML injection  Inject 0.2 mLs (20 Units total) into the skin at bedtime. 11/04/15   [name], FNP  metFORMIN (GLUCOPHAGE) 500 MG tablet Take 1 tablet (500 mg total) by mouth 2 (two) times daily with a meal. 11/03/15   [name], FNP  methocarbamol (ROBAXIN) 500 MG tablet Take 1-2 tablets (500-1,000 mg total) by mouth every 6 (six) hours as needed for muscle spasms. 11/14/15   Montez Morita, PA-C  omeprazole (PRILOSEC) 20 MG  capsule Take 1 capsule (20 mg total) by mouth daily. 11/03/15   [name], FNP  oxyCODONE (OXY IR/ROXICODONE) 5 MG immediate release tablet Take 1-2 tablets (5-10 mg total) by mouth every 6 (six) hours as needed for breakthrough pain (take between percocet as needed for breakthrough  pain). Patient not taking: Reported on 11/28/2015 11/14/15   Montez Morita, PA-C  oxyCODONE-acetaminophen (PERCOCET/ROXICET) 5-325 MG tablet Take 1-2 tablets by mouth every 6 (six) hours as needed for severe pain. Patient not taking: Reported on 11/28/2015 11/14/15   Montez Morita, PA-C  vitamin C (VITAMIN C) 500 MG tablet Take 1 tablet (500 mg total) by mouth daily. 11/14/15   Montez Morita, PA-C  Vitamin D, Ergocalciferol, (DRISDOL) 50000 UNITS CAPS capsule Take 1 capsule (50,000 Units total) by mouth every Wednesday. 11/14/15   Montez Morita, PA-C    ALLERGIES:  No Known Allergies  SOCIAL HISTORY:  Social History  Substance Use Topics  . Smoking status: Never Smoker   . Smokeless tobacco: Never Used  . Alcohol Use: No    FAMILY HISTORY: Family History  Problem Relation Age of Onset  . Diabetes Mother   . Cancer Father   . Diabetes Sister   . Diabetes Maternal Uncle   . Diabetes Maternal Grandmother   . Cancer Paternal Grandmother     EXAM: BP 114/65 mmHg  Pulse 96  Temp(Src) 98.4 F (36.9 C) (Oral)  Resp 18  SpO2 100% CONSTITUTIONAL: Alert and oriented and responds appropriately to questions. Well-appearing; well-nourished HEAD: Normocephalic EYES: Conjunctivae clear, PERRL ENT: normal nose; no rhinorrhea; moist mucous membranes; pharynx without lesions noted NECK: Supple, no meningismus, no LAD  CARD: RRR; S1 and S2 appreciated; no murmurs, no clicks, no rubs, no gallops RESP: Normal chest excursion without splinting or tachypnea; breath sounds clear and equal bilaterally; no wheezes, no rhonchi, no rales, no hypoxia or respiratory distress, speaking full sentences ABD/GI: Normal bowel sounds;  non-distended; soft, non-tender, no rebound, no guarding, no peritoneal signs BACK:  The back appears normal and is non-tender to palpation, there is no CVA tenderness EXT: Normal ROM in all joints; non-tender to palpation; no edema; normal capillary refill; no cyanosis, no calf tenderness or swelling; left foot status post 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 Grooming and personal hygiene are appropriate.  MEDICAL DECISION MAKING: Patient here with bleeding from his left foot from his surgical incision. Underwent fourth toe amputation with Dr. Lajoyce Corners yesterday. This is now his second visit to the emergency department from bleeding from this wound. Uncontrolled with direct pressure and hemostatic gauze. We'll redress wound  and applied TXA.  He also reports chest pressure that he scrubs indigestion. Has had the symptoms for many years. No shortness of breath, diaphoresis, dizziness, nausea or vomiting. Chest pressure gone. EKG shows no ischemic changes. We'll obtain cardiac labs, chest x-ray.  ED PROGRESS: Labs today show the patient has dropped his hemoglobin to 10.8 from 12.5 earlier today. Troponin is negative. Chest x-ray clear. We'll continue to monitor. Bleeding has stopped.   Second troponin is negative. Hemoglobin is stable and slightly improved to 11.5 however patient begins bleeding again whenever he puts his foot down. We'll redress wound, applied quick clot.    4:50 Am  D/w Dr. Ophelia Charter who is on call for Dr. Lajoyce Corners.  Pt and sister refuse to see or talk to  Dr. Ophelia Charter.  Dr. Ophelia Charter is aware of this patient and has already discussed this case with Dr. Roda Shutters who is on call for [facility] and recommends discussing with Dr. Roda Shutters for admission.   5:05 AM  D/w Dr. Roda Shutters who will admit to medical bed, obs. Patient is still chest pain-free. Chest pain is very atypical.   6:00 AM  Pt has begun bleeding again despite elevation, quick clot.  Will place hemostatic powder on wound, another dressing of quick clot and another pressure bandage. Have advised him to keep his foot elevated and not to get out of bed. Orthopedics to see in the morning.    EKG Interpretation  Date/Time:  Saturday November 29 2015 02:03:40 EST Ventricular Rate:  93 PR Interval:  143 QRS Duration: 101 QT Interval:  371 QTC Calculation: 461 R Axis:   109 Text Interpretation:  Sinus rhythm Inferior infarct, old Anterior infarct, old No significant change since last tracing Confirmed by WARD,  DO, KRISTEN ([id number]) on 11/29/2015 2:06:52 AM        I personally performed the services described in this documentation, which was scribed in my presence. The recorded information has been reviewed and is accurate.    Layla Maw Ward, DO 11/29/15 [phone]

## 2015-11-29 NOTE — ED Notes (Signed)
Pt walked from bed, out of room, and 5 feet out of room, pt turned around and walked back to bed. Walker used. No difficulty walking.

## 2015-11-29 NOTE — H&P (Signed)
ORTHOPAEDIC HISTORY AND PHYSICAL   Chief Complaint: postop bleeding  HPI: Travis [name] is a 44 y.o. male who complains of postop bleeding s/p toe amputation by Lajoyce Corners yesterday.  Bleeds everytime he puts it in a dependent position.  Past Medical History  Diagnosis Date  . Peripheral neuropathy (HCC)   . Acute osteomyelitis of metatarsal bone of left foot (HCC) 11/12/2015  . Diabetic peripheral neuropathy associated with type 2 diabetes mellitus (HCC) 08/30/2008    Qualifier: Diagnosis of  By: Daphine Deutscher FNP, Zena Amos    . Closed fracture of right tibial plateau 11/11/2015  . Vitamin D deficiency 11/12/2015  . Osteoporosis 11/12/2015  . GERD (gastroesophageal reflux disease)   . Diabetes mellitus     INSULIN DEPENDENT Type II  . Diabetic ulcer of left foot (HCC) 11/14/2015  . Shortness of breath dyspnea     with exertion   Past Surgical History  Procedure Laterality Date  . Knee surgery Left   . Shoulder surgery Right   . Toe amputation Left 11/28/15    4th toe   Social History   Social History  . Marital Status: Divorced    Spouse Name: N/A  . Number of Children: N/A  . Years of Education: N/A   Social History Main Topics  . Smoking status: Never Smoker   . Smokeless tobacco: Never Used  . Alcohol Use: No  . Drug Use: No  . Sexual Activity: Yes   Other Topics Concern  . None   Social History Narrative   Family History  Problem Relation Age of Onset  . Diabetes Mother   . Cancer Father   . Diabetes Sister   . Diabetes Maternal Uncle   . Diabetes Maternal Grandmother   . Cancer Paternal Grandmother    No Known Allergies Prior to Admission medications   Medication Sig Start Date End Date Taking? Authorizing Provider  aspirin 325 MG tablet Take 1 tablet (325 mg total) by mouth daily. 11/14/15  Yes Montez Morita, PA-C  doxycycline (VIBRAMYCIN) 50 MG capsule Take 2 capsules (100 mg total) by mouth 2 (two) times daily. 11/14/15  Yes Montez Morita, PA-C  insulin  aspart (NOVOLOG) 100 UNIT/ML FlexPen Inject 0-15 Units into the skin 4 (four) times daily -  with meals and at bedtime. Dosing based on blood sugars taken before meals and bedtime. See chart 11/14/15  Yes Montez Morita, PA-C  insulin detemir (LEVEMIR) 100 UNIT/ML injection Inject 0.2 mLs (20 Units total) into the skin at bedtime. 11/04/15  Yes [name], FNP  metFORMIN (GLUCOPHAGE) 500 MG tablet Take 1 tablet (500 mg total) by mouth 2 (two) times daily with a meal. 11/03/15  Yes [name], FNP  methocarbamol (ROBAXIN) 500 MG tablet Take 1-2 tablets (500-1,000 mg total) by mouth every 6 (six) hours as needed for muscle spasms. 11/14/15  Yes Montez Morita, PA-C  omeprazole (PRILOSEC) 20 MG capsule Take 1 capsule (20 mg total) by mouth daily. 11/03/15  Yes [name], FNP  vitamin C (VITAMIN C) 500 MG tablet Take 1 tablet (500 mg total) by mouth daily. 11/14/15  Yes Montez Morita, PA-C  Vitamin D, Ergocalciferol, (DRISDOL) 50000 UNITS CAPS capsule Take 1 capsule (50,000 Units total) by mouth every Wednesday. 11/14/15  Yes Montez Morita, PA-C  DULoxetine (CYMBALTA) 20 MG capsule Take 3 capsules (60 mg total) by mouth daily. Patient not taking: Reported on 11/28/2015 11/03/15   [name], FNP  HYDROcodone-acetaminophen (NORCO) 5-325 MG tablet Take 1  tablet by mouth every 6 (six) hours as needed. Patient not taking: Reported on 11/28/2015 11/28/15   Nadara MustardMarcus Duda V, MD  oxyCODONE (OXY IR/ROXICODONE) 5 MG immediate release tablet Take 1-2 tablets (5-10 mg total) by mouth every 6 (six) hours as needed for breakthrough pain (take between percocet as needed for breakthrough  pain). Patient not taking: Reported on 11/28/2015 11/14/15   Montez MoritaKeith Paul, PA-C  oxyCODONE-acetaminophen (PERCOCET/ROXICET) 5-325 MG tablet Take 1-2 tablets by mouth every 6 (six) hours as needed for severe pain. Patient not taking: Reported on 11/28/2015 11/14/15   Montez MoritaKeith Paul, PA-C   Dg Chest 2 View  11/29/2015  CLINICAL DATA:   Central chest pressure. Recent hospitalization for fourth toe amputation. Diabetes. EXAM: CHEST  2 VIEW COMPARISON:  11/11/2015 FINDINGS: Normal heart size and pulmonary vascularity. No focal airspace disease or consolidation in the lungs. No blunting of costophrenic angles. No pneumothorax. Mediastinal contours appear intact. Calcified granuloma in the right lung base. IMPRESSION: No active cardiopulmonary disease. Electronically Signed   By: Burman NievesWilliam  Stevens M.D.   On: 11/29/2015 03:36   - pertinent xrays, CT, MRI studies were reviewed and independently interpreted  Positive ROS: All other systems have been reviewed and were otherwise negative with the exception of those mentioned in the HPI and as above.  Physical Exam: General: Alert, no acute distress Cardiovascular: No pedal edema Respiratory: No cyanosis, no use of accessory musculature GI: No organomegaly, abdomen is soft and non-tender Skin: No lesions in the area of chief complaint Neurologic: Sensation intact distally Psychiatric: Patient is competent for consent with normal mood and affect Lymphatic: No axillary or cervical lymphadenopathy  MUSCULOSKELETAL:  - dressing dry  Assessment: S/p 4th toe amputation Postop bleeding  Plan: - bedrest - elevate - maintain dressings - if dry in am, may dc Sunday am   N. Glee ArvinMichael Burlin Mcnair, MD [facility] [phone] 10:30 AM

## 2015-11-29 NOTE — ED Notes (Signed)
Wound seal applied and quick clot applied to pts incision area. Several 4 X 4 gauze applied, kerlex applied, and koban applied.

## 2015-11-29 NOTE — ED Notes (Signed)
Per [facility]: Pt had 4th toe amputated on 11/28/15. Left hospital around 5:30 pm. Pt went home and foot was bleeding. Pt called out. Fire department was able to wrap the pts foot and control the bleeding.

## 2015-11-30 LAB — GLUCOSE, CAPILLARY
Glucose-Capillary: 143 mg/dL — ABNORMAL HIGH (ref 65–99)
Glucose-Capillary: 127 mg/dL — ABNORMAL HIGH (ref 65–99)
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
## 2015-12-01 ENCOUNTER — Encounter (HOSPITAL_COMMUNITY): Payer: Self-pay | Admitting: Orthopedic Surgery

## 2015-12-09 ENCOUNTER — Ambulatory Visit: Payer: No Typology Code available for payment source | Admitting: Family Medicine

## 2015-12-12 ENCOUNTER — Ambulatory Visit (INDEPENDENT_AMBULATORY_CARE_PROVIDER_SITE_OTHER): Payer: Medicaid Other | Admitting: Family Medicine

## 2015-12-12 ENCOUNTER — Encounter: Payer: Self-pay | Admitting: Family Medicine

## 2015-12-12 VITALS — BP 107/69 | HR 88 | Temp 97.6°F | Resp 16 | Ht 74.0 in | Wt 191.0 lb

## 2015-12-12 DIAGNOSIS — G629 Polyneuropathy, unspecified: Secondary | ICD-10-CM

## 2015-12-12 DIAGNOSIS — Z794 Long term (current) use of insulin: Secondary | ICD-10-CM

## 2015-12-12 DIAGNOSIS — E1142 Type 2 diabetes mellitus with diabetic polyneuropathy: Secondary | ICD-10-CM

## 2015-12-12 DIAGNOSIS — M86172 Other acute osteomyelitis, left ankle and foot: Secondary | ICD-10-CM

## 2015-12-12 DIAGNOSIS — G4700 Insomnia, unspecified: Secondary | ICD-10-CM

## 2015-12-12 DIAGNOSIS — K219 Gastro-esophageal reflux disease without esophagitis: Secondary | ICD-10-CM | POA: Diagnosis not present

## 2015-12-12 DIAGNOSIS — H539 Unspecified visual disturbance: Secondary | ICD-10-CM

## 2015-12-12 LAB — POCT URINALYSIS DIP (DEVICE)
Bilirubin Urine: NEGATIVE
GLUCOSE, UA: 500 mg/dL — AB
Hgb urine dipstick: NEGATIVE
KETONES UR: NEGATIVE mg/dL
Leukocytes, UA: NEGATIVE
Nitrite: NEGATIVE
PROTEIN: 30 mg/dL — AB
Urobilinogen, UA: 0.2 mg/dL (ref 0.0–1.0)
pH: 5.5 (ref 5.0–8.0)

## 2015-12-12 MED ORDER — INSULIN DETEMIR 100 UNIT/ML ~~LOC~~ SOLN: 20.0000 [IU] | Freq: Every day | SUBCUTANEOUS | Status: DC

## 2015-12-12 MED ORDER — DEXLANSOPRAZOLE 60 MG PO CPDR: 60.0000 mg | DELAYED_RELEASE_CAPSULE | Freq: Every day | ORAL | Status: DC

## 2015-12-12 MED ORDER — ZOLPIDEM TARTRATE 5 MG PO TABS: 5.0000 mg | ORAL_TABLET | Freq: Every evening | ORAL | Status: DC | PRN

## 2015-12-12 MED ORDER — METFORMIN HCL 500 MG PO TABS
500.0000 mg | ORAL_TABLET | Freq: Two times a day (BID) | ORAL | Status: DC
Start: 2015-12-12 — End: 2016-02-04

## 2015-12-12 NOTE — Progress Notes (Signed)
Subjective:    Patient ID: Travis [name], male    DOB: 08-23-1971, 44 y.o.   MRN: [id number]  Diabetes Pertinent negatives for hypoglycemia include no nervousness/anxiousness. Associated symptoms include polyuria. Pertinent negatives for diabetes include no fatigue, no polydipsia and no polyphagia.   Mr. Dhillon [place], a 44 year old male with a history of uncontrolled diabetes type 2 with neuropathy presents to for a 1 month follow up of diabetes.  He was a patient of Dr. Lynnea Ferrier until loosing his insurance benefits. He states that he has been without medications for greater than 1 year for diabetes and neuropathy. He states that he was on short acting and long acting insulin.  He maintains that neuropathy is controlled on Cymbalta. He has been out to medication for 1 year. Symptoms consists of paresthesia of the feet, polyuria and visual disturbances. 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 Lajoyce Corners, orthopedic physician. He states that pain is 6/10. He has not picked up pain medication from pharmacy.   Patient denies polydipsia and visual disturbances.  Mr. Folz checks blood sugars at home, he averages 160-180s in am and 140s post prandial.    Paitent complains of GERD. This has been associated with early satiety and heartburn.  He denies chest pain, choking on food, cough, difficulty swallowing, dysphagia, hematemesis, need to clear throat frequently and shortness of breath. Symptoms have been present for several months. He denies dysphagia.  He has not lost weight. He denies melena, hematochezia, hematemesis, and coffee ground emesis. He has been taking Omeprazole consistently with minimal relief.  Past Medical History  Diagnosis Date  . Peripheral neuropathy (HCC)   . Acute osteomyelitis of metatarsal bone of left foot (HCC) 11/12/2015  . Diabetic peripheral neuropathy associated with type 2 diabetes mellitus (HCC) 08/30/2008    Qualifier:  Diagnosis of  By: Daphine Deutscher FNP, Zena Amos    . Closed fracture of right tibial plateau 11/11/2015  . Vitamin D deficiency 11/12/2015  . Osteoporosis 11/12/2015  . GERD (gastroesophageal reflux disease)   . Diabetes mellitus     INSULIN DEPENDENT Type II  . Diabetic ulcer of left foot (HCC) 11/14/2015  . Shortness of breath dyspnea     with exertion   Immunization History  Administered Date(s) Administered  . Influenza Whole 09/17/2009  . Pneumococcal Polysaccharide-23 07/13/2004, 08/30/2008  . Tdap 11/10/2015   Social History   Social History  . Marital Status: Divorced    Spouse Name: N/A  . Number of Children: N/A  . Years of Education: N/A   Occupational History  . Not on file.   Social History Main Topics  . Smoking status: Never Smoker   . Smokeless tobacco: Never Used  . Alcohol Use: No  . Drug Use: No  . Sexual Activity: Yes   Other Topics Concern  . Not on file   Social History Narrative  No Known Allergies   Immunization History  Administered Date(s) Administered  . Influenza Whole 09/17/2009  . Pneumococcal Polysaccharide-23 07/13/2004, 08/30/2008  . Tdap 11/10/2015   Review of Systems  Constitutional: Negative for fatigue and unexpected weight change.  HENT: Negative.  Negative for sore throat.   Eyes: Positive for visual disturbance (blurred vision primarily to left eye).  Respiratory: Negative.  Negative for shortness of breath.   Cardiovascular: Negative.   Gastrointestinal: Negative.  Negative for vomiting.  Endocrine: Positive for polyuria. Negative for polydipsia and polyphagia.  Musculoskeletal: Negative.   Skin:  Negative.  Negative for wound.  Neurological: Positive for numbness (primarily to left foot).  Hematological: Negative.   Psychiatric/Behavioral: Negative.  Negative for suicidal ideas and sleep disturbance. The patient is not nervous/anxious.        Objective:   Physical Exam  Constitutional: He is oriented to person, place, and  time. He appears well-developed and well-nourished.  HENT:  Head: Normocephalic and atraumatic.  Right Ear: External ear normal.  Left Ear: External ear normal.  Mouth/Throat: Oropharynx is clear and moist.  Eyes: Conjunctivae and EOM are normal. Pupils are equal, round, and reactive to light. No scleral icterus.  Neck: Trachea normal and normal range of motion. Neck supple.  Cardiovascular: Normal rate, regular rhythm, normal heart sounds and intact distal pulses.   Pulmonary/Chest: Effort normal and breath sounds normal.  Abdominal: Soft. Bowel sounds are normal.  Musculoskeletal: Normal range of motion.  Neurological: He is alert and oriented to person, place, and time. He has normal reflexes.  Skin: Skin is warm, dry and intact.  Healing scars to bilateral lower extremities.   Dressing to left foot clean, dry, and intact. Patient refuses to remove dressing for assessment.     Psychiatric: He has a normal mood and affect. His speech is normal and behavior is normal. Judgment and thought content normal.      BP 107/69 mmHg  Pulse 88  Temp(Src) 97.6 F (36.4 C) (Oral)  Resp 16  Ht  (1.88 m)  Wt 191 lb (86.637 kg)  BMI 24.51 kg/m2 Assessment & Plan:  1. Type 2 diabetes mellitus with diabetic polyneuropathy, with long-term current use of insulin (HCC) Discussed basic carbohydrate counting at length Mr. Antos checks blood sugars at home, he averages 160-180s in am and 140s 2 hours after meals.   Patient will bring glucometer and medications to follow up appointment in 2 months.  - insulin detemir (LEVEMIR) 100 UNIT/ML injection; Inject 0.2 mLs (20 Units total) into the skin at bedtime.  Dispense: 10 mL; Refill: 11 - metFORMIN (GLUCOPHAGE) 500 MG tablet; Take 1 tablet (500 mg total) by mouth 2 (two) times daily with a meal.  Dispense: 60 tablet; Refill: 2 - POCT urinalysis dipstick  2. Insomnia Patient is having difficulty falling asleep and staying asleep.  - zolpidem  (AMBIEN) 5 MG tablet; Take 1 tablet (5 mg total) by mouth at bedtime as needed for sleep.  Dispense: 30 tablet; Refill: 0  3. Gastroesophageal reflux disease without esophagitis Patient states that Omeprazole is no longer effective. Given sample of Dexilant. Patient and I will follow up in 1 week to discuss medication. We discussed diet at length, patient expressed understanding.  - dexlansoprazole (DEXILANT) 60 MG capsule; Take 1 capsule (60 mg total) by mouth daily.  Dispense: 10 capsule; Refill: 0  4. Neuropathy (HCC) Will continue Cymbalta as previously prescribed.   5. 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 Dressing is clean, dry, and intact. Patient is to follow up with  Dr. Lajoyce Corners as scheduled.   6. Visual disturbance Patient reports occasional blurred vision to left eye. Will send referral to opthalmology to rule out diabetic retinopathy.  - Ambulatory referral to Ophthalmology  [name], FNP   RTC: 2 months for DMII, neuropathy, insomnia, and GERD  The patient was given clear instructions to go to ER or return to medical center if symptoms do not improve, worsen or new problems develop. The  patient verbalized understanding. Will notify patient with laboratory results.

## 2015-12-12 NOTE — Patient Instructions (Addendum)
Recommend that patient improve sleep hygiene. Refrain from watching television, using laptop, or cell phone while in bed. Also, refrain from eating prior to lying down. Will start a trial of ambien 5 mg daily.    Will start a trial of Dexilant for GERD, follow up by telephone in 1 week to discuss whether medication is effective.   Patient to follow up with Dr. Lajoyce Corners, orthopedics as scheduled.    Insomnia Insomnia is a sleep disorder that makes it difficult to fall asleep or to stay asleep. Insomnia can cause tiredness (fatigue), low energy, difficulty concentrating, mood swings, and poor performance at work or school.  There are three different ways to classify insomnia:  Difficulty falling asleep.  Difficulty staying asleep.  Waking up too early in the morning. Any type of insomnia can be long-term (chronic) or short-term (acute). Both are common. Short-term insomnia usually lasts for three months or less. Chronic insomnia occurs at least three times a week for longer than three months. CAUSES  Insomnia may be caused by another condition, situation, or substance, such as:  Anxiety.  Certain medicines.  Gastroesophageal reflux disease (GERD) or other gastrointestinal conditions.  Asthma or other breathing conditions.  Restless legs syndrome, sleep apnea, or other sleep disorders.  Chronic pain.  Menopause. This may include hot flashes.  Stroke.  Abuse of alcohol, tobacco, or illegal drugs.  Depression.  Caffeine.   Neurological disorders, such as Alzheimer disease.  An overactive thyroid (hyperthyroidism). The cause of insomnia may not be known. RISK FACTORS Risk factors for insomnia include:  Gender. Women are more commonly affected than men.  Age. Insomnia is more common as you get older.  Stress. This may involve your professional or personal life.  Income. Insomnia is more common in people with lower income.  Lack of exercise.   Irregular work schedule  or night shifts.  Traveling between different time zones. SIGNS AND SYMPTOMS If you have insomnia, trouble falling asleep or trouble staying asleep is the main symptom. This may lead to other symptoms, such as:  Feeling fatigued.  Feeling nervous about going to sleep.  Not feeling rested in the morning.  Having trouble concentrating.  Feeling irritable, anxious, or depressed. TREATMENT  Treatment for insomnia depends on the cause. If your insomnia is caused by an underlying condition, treatment will focus on addressing the condition. Treatment may also include:   Medicines to help you sleep.  Counseling or therapy.  Lifestyle adjustments. HOME CARE INSTRUCTIONS   Take medicines only as directed by your health care provider.  Keep regular sleeping and waking hours. Avoid naps.  Keep a sleep diary to help you and your health care provider figure out what could be causing your insomnia. Include:   When you sleep.  When you wake up during the night.  How well you sleep.   How rested you feel the next day.  Any side effects of medicines you are taking.  What you eat and drink.   Make your bedroom a comfortable place where it is easy to fall asleep:  Put up shades or special blackout curtains to block light from outside.  Use a white noise machine to block noise.  Keep the temperature cool.   Exercise regularly as directed by your health care provider. Avoid exercising right before bedtime.  Use relaxation techniques to manage stress. Ask your health care provider to suggest some techniques that may work well for you. These may include:  Breathing exercises.  Routines to release  muscle tension.  Visualizing peaceful scenes.  Cut back on alcohol, caffeinated beverages, and cigarettes, especially close to bedtime. These can disrupt your sleep.  Do not overeat or eat spicy foods right before bedtime. This can lead to digestive discomfort that can make it hard  for you to sleep.  Limit screen use before bedtime. This includes:  Watching TV.  Using your smartphone, tablet, and computer.  Stick to a routine. This can help you fall asleep faster. Try to do a quiet activity, brush your teeth, and go to bed at the same time each night.  Get out of bed if you are still awake after 15 minutes of trying to sleep. Keep the lights down, but try reading or doing a quiet activity. When you feel sleepy, go back to bed.  Make sure that you drive carefully. Avoid driving if you feel very sleepy.  Keep all follow-up appointments as directed by your health care provider. This is important. SEEK MEDICAL CARE IF:   You are tired throughout the day or have trouble in your daily routine due to sleepiness.  You continue to have sleep problems or your sleep problems get worse. SEEK IMMEDIATE MEDICAL CARE IF:   You have serious thoughts about hurting yourself or someone else.   This information is not intended to replace advice given to you by your health care provider. Make sure you discuss any questions you have with your health care provider.   Document Released: 11/26/2000 Document Revised: 08/20/2015 Document Reviewed: 08/30/2014 Elsevier Interactive Patient Education 2016 [employer] Gastroesophageal Reflux Disease, Adult Normally, food travels down the esophagus and stays in the stomach to be digested. However, when a person has gastroesophageal reflux disease (GERD), food and stomach acid move back up into the esophagus. When this happens, the esophagus becomes sore and inflamed. Over time, GERD can create small holes (ulcers) in the lining of the esophagus.  CAUSES This condition is caused by a problem with the muscle between the esophagus and the stomach (lower esophageal sphincter, or LES). Normally, the LES muscle closes after food passes through the esophagus to the stomach. When the LES is weakened or abnormal, it does not close properly, and that  allows food and stomach acid to go back up into the esophagus. The LES can be weakened by certain dietary substances, medicines, and medical conditions, including:  Tobacco use.  Pregnancy.  Having a hiatal hernia.  Heavy alcohol use.  Certain foods and beverages, such as coffee, chocolate, onions, and peppermint. RISK FACTORS This condition is more likely to develop in:  People who have an increased body weight.  People who have connective tissue disorders.  People who use NSAID medicines. SYMPTOMS Symptoms of this condition include:  Heartburn.  Difficult or painful swallowing.  The feeling of having a lump in the throat.  Abitter taste in the mouth.  Bad breath.  Having a large amount of saliva.  Having an upset or bloated stomach.  Belching.  Chest pain.  Shortness of breath or wheezing.  Ongoing (chronic) cough or a night-time cough.  Wearing away of tooth enamel.  Weight loss. Different conditions can cause chest pain. Make sure to see your health care provider if you experience chest pain. DIAGNOSIS Your health care provider will take a medical history and perform a physical exam. To determine if you have mild or severe GERD, your health care provider may also monitor how you respond to treatment. You may also have other tests, including:  An  endoscopy toexamine your stomach and esophagus with a small camera.  A test thatmeasures the acidity level in your esophagus.  A test thatmeasures how much pressure is on your esophagus.  A barium swallow or modified barium swallow to show the shape, size, and functioning of your esophagus. TREATMENT The goal of treatment is to help relieve your symptoms and to prevent complications. Treatment for this condition may vary depending on how severe your symptoms are. Your health care provider may recommend:  Changes to your diet.  Medicine.  Surgery. HOME CARE INSTRUCTIONS Diet  Follow a diet as  recommended by your health care provider. This may involve avoiding foods and drinks such as:  Coffee and tea (with or without caffeine).  Drinks that containalcohol.  Energy drinks and sports drinks.  Carbonated drinks or sodas.  Chocolate and cocoa.  Peppermint and mint flavorings.  Garlic and onions.  Horseradish.  Spicy and acidic foods, including peppers, chili powder, curry powder, vinegar, hot sauces, and barbecue sauce.  Citrus fruit juices and citrus fruits, such as oranges, lemons, and limes.  Tomato-based foods, such as red sauce, chili, salsa, and pizza with red sauce.  Fried and fatty foods, such as donuts, french fries, potato chips, and high-fat dressings.  High-fat meats, such as hot dogs and fatty cuts of red and white meats, such as rib eye steak, sausage, ham, and bacon.  High-fat dairy items, such as whole milk, butter, and cream cheese.  Eat small, frequent meals instead of large meals.  Avoid drinking large amounts of liquid with your meals.  Avoid eating meals during the 2-3 hours before bedtime.  Avoid lying down right after you eat.  Do not exercise right after you eat. General Instructions  Pay attention to any changes in your symptoms.  Take over-the-counter and prescription medicines only as told by your health care provider. Do not take aspirin, ibuprofen, or other NSAIDs unless your health care provider told you to do so.  Do not use any tobacco products, including cigarettes, chewing tobacco, and e-cigarettes. If you need help quitting, ask your health care provider.  Wear loose-fitting clothing. Do not wear anything tight around your waist that causes pressure on your abdomen.  Raise (elevate) the head of your bed 6 inches (15cm).  Try to reduce your stress, such as with yoga or meditation. If you need help reducing stress, ask your health care provider.  If you are overweight, reduce your weight to an amount that is healthy for  you. Ask your health care provider for guidance about a safe weight loss goal.  Keep all follow-up visits as told by your health care provider. This is important. SEEK MEDICAL CARE IF:  You have new symptoms.  You have unexplained weight loss.  You have difficulty swallowing, or it hurts to swallow.  You have wheezing or a persistent cough.  Your symptoms do not improve with treatment.  You have a hoarse voice. SEEK IMMEDIATE MEDICAL CARE IF:  You have pain in your arms, neck, jaw, teeth, or back.  You feel sweaty, dizzy, or light-headed.  You have chest pain or shortness of breath.  You vomit and your vomit looks like blood or coffee grounds.  You faint.  Your stool is bloody or black.  You cannot swallow, drink, or eat.   This information is not intended to replace advice given to you by your health care provider. Make sure you discuss any questions you have with your health care provider.  Document Released: 09/08/2005 Document Revised: 08/20/2015 Document Reviewed: 03/26/2015 Elsevier Interactive Patient Education 2016 [employer]. Food Choices for Gastroesophageal Reflux Disease, Adult When you have gastroesophageal reflux disease (GERD), the foods you eat and your eating habits are very important. Choosing the right foods can help ease the discomfort of GERD. WHAT GENERAL GUIDELINES DO I NEED TO FOLLOW?  Choose fruits, vegetables, whole grains, low-fat dairy products, and low-fat meat, fish, and poultry.  Limit fats such as oils, salad dressings, butter, nuts, and avocado.  Keep a food diary to identify foods that cause symptoms.  Avoid foods that cause reflux. These may be different for different people.  Eat frequent small meals instead of three large meals each day.  Eat your meals slowly, in a relaxed setting.  Limit fried foods.  Cook foods using methods other than frying.  Avoid drinking alcohol.  Avoid drinking large amounts of liquids with  your meals.  Avoid bending over or lying down until 2-3 hours after eating. WHAT FOODS ARE NOT RECOMMENDED? The following are some foods and drinks that may worsen your symptoms: Vegetables Tomatoes. Tomato juice. Tomato and spaghetti sauce. Chili peppers. Onion and garlic. Horseradish. Fruits Oranges, grapefruit, and lemon (fruit and juice). Meats High-fat meats, fish, and poultry. This includes hot dogs, ribs, ham, sausage, salami, and bacon. Dairy Whole milk and chocolate milk. Sour cream. Cream. Butter. Ice cream. Cream cheese.  Beverages Coffee and tea, with or without caffeine. Carbonated beverages or energy drinks. Condiments Hot sauce. Barbecue sauce.  Sweets/Desserts Chocolate and cocoa. Donuts. Peppermint and spearmint. Fats and Oils High-fat foods, including [place] fries and potato chips. Other Vinegar. Strong spices, such as black pepper, white pepper, red pepper, cayenne, curry powder, cloves, ginger, and chili powder. The items listed above may not be a complete list of foods and beverages to avoid. Contact your dietitian for more information.   This information is not intended to replace advice given to you by your health care provider. Make sure you discuss any questions you have with your health care provider.   Document Released: 11/29/2005 Document Revised: 12/20/2014 Document Reviewed: 10/03/2013 Elsevier Interactive Patient Education [employer]

## 2015-12-17 LAB — GLUCOSE, CAPILLARY: Glucose-Capillary: 166 mg/dL — ABNORMAL HIGH (ref 65–99)

## 2015-12-17 MED FILL — NITROGLYCERIN 0.2 MG/HR PAT: 0.2 | 30 days supply | Qty: 30 | Fill #0

## 2015-12-17 MED FILL — SSD 1% CREAM: 1 | 30 days supply | Qty: 400 | Fill #0

## 2015-12-17 MED FILL — DOXYCYCLINE 100 MG TABLET: 100 | 14 days supply | Qty: 28 | Fill #0

## 2016-01-06 MED FILL — ?METFORMIN HCL 500MG TABLET: 500 | 30 days supply | Qty: 60 | Fill #0

## 2016-01-06 MED FILL — ?DOXYCYCLINE MONO 100 MG TA: 100 | 30 days supply | Qty: 60 | Fill #0

## 2016-01-06 MED FILL — !LEVEMIR 100 UNITS/ML VIAL: 100/ML | 50 days supply | Qty: 10 | Fill #0

## 2016-01-14 ENCOUNTER — Other Ambulatory Visit: Payer: Self-pay | Admitting: Internal Medicine

## 2016-01-14 DIAGNOSIS — E1142 Type 2 diabetes mellitus with diabetic polyneuropathy: Secondary | ICD-10-CM

## 2016-01-14 DIAGNOSIS — Z794 Long term (current) use of insulin: Secondary | ICD-10-CM

## 2016-01-14 DIAGNOSIS — G629 Polyneuropathy, unspecified: Secondary | ICD-10-CM

## 2016-01-14 MED ORDER — INSULIN ASPART 100 UNIT/ML FLEXPEN: 0.0000 [IU] | PEN_INJECTOR | Freq: Three times a day (TID) | SUBCUTANEOUS | Status: DC

## 2016-01-14 MED ORDER — INSULIN DETEMIR 100 UNIT/ML ~~LOC~~ SOLN: 20.0000 [IU] | Freq: Every day | SUBCUTANEOUS | Status: DC

## 2016-01-14 MED ORDER — DULOXETINE HCL 20 MG PO CPEP: 60.0000 mg | ORAL_CAPSULE | Freq: Every day | ORAL | Status: DC

## 2016-01-21 ENCOUNTER — Encounter: Payer: Self-pay | Admitting: Gastroenterology

## 2016-01-21 ENCOUNTER — Other Ambulatory Visit: Payer: Self-pay | Admitting: Family Medicine

## 2016-01-21 ENCOUNTER — Ambulatory Visit (INDEPENDENT_AMBULATORY_CARE_PROVIDER_SITE_OTHER): Payer: Medicaid Other | Admitting: Family Medicine

## 2016-01-21 VITALS — BP 119/81 | HR 89 | Temp 98.3°F | Resp 14 | Ht 74.0 in | Wt 195.0 lb

## 2016-01-21 DIAGNOSIS — E1142 Type 2 diabetes mellitus with diabetic polyneuropathy: Secondary | ICD-10-CM

## 2016-01-21 DIAGNOSIS — E1165 Type 2 diabetes mellitus with hyperglycemia: Secondary | ICD-10-CM | POA: Diagnosis not present

## 2016-01-21 DIAGNOSIS — N529 Male erectile dysfunction, unspecified: Secondary | ICD-10-CM | POA: Diagnosis not present

## 2016-01-21 DIAGNOSIS — G4700 Insomnia, unspecified: Secondary | ICD-10-CM | POA: Diagnosis not present

## 2016-01-21 DIAGNOSIS — K219 Gastro-esophageal reflux disease without esophagitis: Secondary | ICD-10-CM

## 2016-01-21 LAB — POCT URINALYSIS DIP (DEVICE)
Bilirubin Urine: NEGATIVE
Glucose, UA: NEGATIVE mg/dL
HGB URINE DIPSTICK: NEGATIVE
Ketones, ur: NEGATIVE mg/dL
LEUKOCYTES UA: NEGATIVE
Nitrite: NEGATIVE
Protein, ur: NEGATIVE mg/dL
SPECIFIC GRAVITY, URINE: 1.015 (ref 1.005–1.030)
Urobilinogen, UA: 0.2 mg/dL (ref 0.0–1.0)
pH: 5 (ref 5.0–8.0)

## 2016-01-21 MED ORDER — RANITIDINE HCL 150 MG PO TABS: 150.0000 mg | ORAL_TABLET | Freq: Two times a day (BID) | ORAL | Status: DC

## 2016-01-21 MED ORDER — AMITRIPTYLINE HCL 10 MG PO TABS: 10.0000 mg | ORAL_TABLET | Freq: Every day | ORAL | Status: DC

## 2016-01-21 NOTE — Patient Instructions (Signed)
Food Choices for Gastroesophageal Reflux Disease, Adult °When you have gastroesophageal reflux disease (GERD), the foods you eat and your eating habits are very important. Choosing the right foods can help ease the discomfort of GERD. °WHAT GENERAL GUIDELINES DO I NEED TO FOLLOW? °· Choose fruits, vegetables, whole grains, low-fat dairy products, and low-fat meat, fish, and poultry. °· Limit fats such as oils, salad dressings, butter, nuts, and avocado. °· Keep a food diary to identify foods that cause symptoms. °· Avoid foods that cause reflux. These may be different for different people. °· Eat frequent small meals instead of three large meals each day. °· Eat your meals slowly, in a relaxed setting. °· Limit fried foods. °· Cook foods using methods other than frying. °· Avoid drinking alcohol. °· Avoid drinking large amounts of liquids with your meals. °· Avoid bending over or lying down until 2-3 hours after eating. °WHAT FOODS ARE NOT RECOMMENDED? °The following are some foods and drinks that may worsen your symptoms: °Vegetables °Tomatoes. Tomato juice. Tomato and spaghetti sauce. Chili peppers. Onion and garlic. Horseradish. °Fruits °Oranges, grapefruit, and lemon (fruit and juice). °Meats °High-fat meats, fish, and poultry. This includes hot dogs, ribs, ham, sausage, salami, and bacon. °Dairy °Whole milk and chocolate milk. Sour cream. Cream. Butter. Ice cream. Cream cheese.  °Beverages °Coffee and tea, with or without caffeine. Carbonated beverages or energy drinks. °Condiments °Hot sauce. Barbecue sauce.  °Sweets/Desserts °Chocolate and cocoa. Donuts. Peppermint and spearmint. °Fats and Oils °High-fat foods, including French fries and potato chips. °Other °Vinegar. Strong spices, such as black pepper, white pepper, red pepper, cayenne, curry powder, cloves, ginger, and chili powder. °The items listed above may not be a complete list of foods and beverages to avoid. Contact your dietitian for more  information. °  °This information is not intended to replace advice given to you by your health care provider. Make sure you discuss any questions you have with your health care provider. °  °Document Released: 11/29/2005 Document Revised: 12/20/2014 Document Reviewed: 10/03/2013 °Elsevier Interactive Patient Education ©2016 Elsevier Inc[employer] ° °Gastroesophageal Reflux Disease, Adult °Normally, food travels down the esophagus and stays in the stomach to be digested. However, when a person has gastroesophageal reflux disease (GERD), food and stomach acid move back up into the esophagus. When this happens, the esophagus becomes sore and inflamed. Over time, GERD can create small holes (ulcers) in the lining of the esophagus.  °CAUSES °This condition is caused by a problem with the muscle between the esophagus and the stomach (lower esophageal sphincter, or LES). Normally, the LES muscle closes after food passes through the esophagus to the stomach. When the LES is weakened or abnormal, it does not close properly, and that allows food and stomach acid to go back up into the esophagus. The LES can be weakened by certain dietary substances, medicines, and medical conditions, including: °· Tobacco use. °· Pregnancy. °· Having a hiatal hernia. °· Heavy alcohol use. °· Certain foods and beverages, such as coffee, chocolate, onions, and peppermint. °RISK FACTORS °This condition is more likely to develop in: °· People who have an increased body weight. °· People who have connective tissue disorders. °· People who use NSAID medicines. °SYMPTOMS °Symptoms of this condition include: °· Heartburn. °· Difficult or painful swallowing. °· The feeling of having a lump in the throat. °· A bitter taste in the mouth. °· Bad breath. °· Having a large amount of saliva. °· Having an upset or bloated stomach. °· Belching. °· Chest pain. °·   Shortness of breath or wheezing.  Ongoing (chronic) cough or a night-time cough.  Wearing away of tooth  enamel.  Weight loss. Different conditions can cause chest pain. Make sure to see your health care provider if you experience chest pain. DIAGNOSIS Your health care provider will take a medical history and perform a physical exam. To determine if you have mild or severe GERD, your health care provider may also monitor how you respond to treatment. You may also have other tests, including:  An endoscopy toexamine your stomach and esophagus with a small camera.  A test thatmeasures the acidity level in your esophagus.  A test thatmeasures how much pressure is on your esophagus.  A barium swallow or modified barium swallow to show the shape, size, and functioning of your esophagus. TREATMENT The goal of treatment is to help relieve your symptoms and to prevent complications. Treatment for this condition may vary depending on how severe your symptoms are. Your health care provider may recommend:  Changes to your diet.  Medicine.  Surgery. HOME CARE INSTRUCTIONS Diet  Follow a diet as recommended by your health care provider. This may involve avoiding foods and drinks such as:  Coffee and tea (with or without caffeine).  Drinks that containalcohol.  Energy drinks and sports drinks.  Carbonated drinks or sodas.  Chocolate and cocoa.  Peppermint and mint flavorings.  Garlic and onions.  Horseradish.  Spicy and acidic foods, including peppers, chili powder, curry powder, vinegar, hot sauces, and barbecue sauce.  Citrus fruit juices and citrus fruits, such as oranges, lemons, and limes.  Tomato-based foods, such as red sauce, chili, salsa, and pizza with red sauce.  Fried and fatty foods, such as donuts, french fries, potato chips, and high-fat dressings.  High-fat meats, such as hot dogs and fatty cuts of red and white meats, such as rib eye steak, sausage, ham, and bacon.  High-fat dairy items, such as whole milk, butter, and cream cheese.  Eat small, frequent  meals instead of large meals.  Avoid drinking large amounts of liquid with your meals.  Avoid eating meals during the 2-3 hours before bedtime.  Avoid lying down right after you eat.  Do not exercise right after you eat. General Instructions  Pay attention to any changes in your symptoms.  Take over-the-counter and prescription medicines only as told by your health care provider. Do not take aspirin, ibuprofen, or other NSAIDs unless your health care provider told you to do so.  Do not use any tobacco products, including cigarettes, chewing tobacco, and e-cigarettes. If you need help quitting, ask your health care provider.  Wear loose-fitting clothing. Do not wear anything tight around your waist that causes pressure on your abdomen.  Raise (elevate) the head of your bed 6 inches (15cm).  Try to reduce your stress, such as with yoga or meditation. If you need help reducing stress, ask your health care provider.  If you are overweight, reduce your weight to an amount that is healthy for you. Ask your health care provider for guidance about a safe weight loss goal.  Keep all follow-up visits as told by your health care provider. This is important. SEEK MEDICAL CARE IF:  You have new symptoms.  You have unexplained weight loss.  You have difficulty swallowing, or it hurts to swallow.  You have wheezing or a persistent cough.  Your symptoms do not improve with treatment.  You have a hoarse voice. SEEK IMMEDIATE MEDICAL CARE IF:  You have pain  in your arms, neck, jaw, teeth, or back.  You feel sweaty, dizzy, or light-headed.  You have chest pain or shortness of breath.  You vomit and your vomit looks like blood or coffee grounds.  You faint.  Your stool is bloody or black.  You cannot swallow, drink, or eat.   This information is not intended to replace advice given to you by your health care provider. Make sure you discuss any questions you have with your health  care provider.   Document Released: 09/08/2005 Document Revised: 08/20/2015 Document Reviewed: 03/26/2015 Elsevier Interactive Patient Education 2016 Elsevier Inc[employer] Insomnia Insomnia is a sleep disorder that makes it difficult to fall asleep or to stay asleep. Insomnia can cause tiredness (fatigue), low energy, difficulty concentrating, mood swings, and poor performance at work or school.  There are three different ways to classify insomnia:  Difficulty falling asleep.  Difficulty staying asleep.  Waking up too early in the morning. Any type of insomnia can be long-term (chronic) or short-term (acute). Both are common. Short-term insomnia usually lasts for three months or less. Chronic insomnia occurs at least three times a week for longer than three months. CAUSES  Insomnia may be caused by another condition, situation, or substance, such as:  Anxiety.  Certain medicines.  Gastroesophageal reflux disease (GERD) or other gastrointestinal conditions.  Asthma or other breathing conditions.  Restless legs syndrome, sleep apnea, or other sleep disorders.  Chronic pain.  Menopause. This may include hot flashes.  Stroke.  Abuse of alcohol, tobacco, or illegal drugs.  Depression.  Caffeine.   Neurological disorders, such as Alzheimer disease.  An overactive thyroid (hyperthyroidism). The cause of insomnia may not be known. RISK FACTORS Risk factors for insomnia include:  Gender. Women are more commonly affected than men.  Age. Insomnia is more common as you get older.  Stress. This may involve your professional or personal life.  Income. Insomnia is more common in people with lower income.  Lack of exercise.   Irregular work schedule or night shifts.  Traveling between different time zones. SIGNS AND SYMPTOMS If you have insomnia, trouble falling asleep or trouble staying asleep is the main symptom. This may lead to other symptoms, such as:  Feeling  fatigued.  Feeling nervous about going to sleep.  Not feeling rested in the morning.  Having trouble concentrating.  Feeling irritable, anxious, or depressed. TREATMENT  Treatment for insomnia depends on the cause. If your insomnia is caused by an underlying condition, treatment will focus on addressing the condition. Treatment may also include:   Medicines to help you sleep.  Counseling or therapy.  Lifestyle adjustments. HOME CARE INSTRUCTIONS   Take medicines only as directed by your health care provider.  Keep regular sleeping and waking hours. Avoid naps.  Keep a sleep diary to help you and your health care provider figure out what could be causing your insomnia. Include:   When you sleep.  When you wake up during the night.  How well you sleep.   How rested you feel the next day.  Any side effects of medicines you are taking.  What you eat and drink.   Make your bedroom a comfortable place where it is easy to fall asleep:  Put up shades or special blackout curtains to block light from outside.  Use a white noise machine to block noise.  Keep the temperature cool.   Exercise regularly as directed by your health care provider. Avoid exercising right before bedtime.  Use relaxation  techniques to manage stress. Ask your health care provider to suggest some techniques that may work well for you. These may include:  Breathing exercises.  Routines to release muscle tension.  Visualizing peaceful scenes.  Cut back on alcohol, caffeinated beverages, and cigarettes, especially close to bedtime. These can disrupt your sleep.  Do not overeat or eat spicy foods right before bedtime. This can lead to digestive discomfort that can make it hard for you to sleep.  Limit screen use before bedtime. This includes:  Watching TV.  Using your smartphone, tablet, and computer.  Stick to a routine. This can help you fall asleep faster. Try to do a quiet activity,  brush your teeth, and go to bed at the same time each night.  Get out of bed if you are still awake after 15 minutes of trying to sleep. Keep the lights down, but try reading or doing a quiet activity. When you feel sleepy, go back to bed.  Make sure that you drive carefully. Avoid driving if you feel very sleepy.  Keep all follow-up appointments as directed by your health care provider. This is important. SEEK MEDICAL CARE IF:   You are tired throughout the day or have trouble in your daily routine due to sleepiness.  You continue to have sleep problems or your sleep problems get worse. SEEK IMMEDIATE MEDICAL CARE IF:   You have serious thoughts about hurting yourself or someone else.   This information is not intended to replace advice given to you by your health care provider. Make sure you discuss any questions you have with your health care provider.   Document Released: 11/26/2000 Document Revised: 08/20/2015 Document Reviewed: 08/30/2014 Elsevier Interactive Patient Education [employer]

## 2016-01-21 NOTE — Progress Notes (Signed)
Subjective:    Patient ID: Travis [name], male    DOB: July 05, 1971, 45 y.o.   MRN: [id number]  Gastroesophageal Reflux He reports no sore throat. Pertinent negatives include no fatigue.  Diabetes Pertinent negatives for hypoglycemia include no nervousness/anxiousness. Pertinent negatives for diabetes include no fatigue, no polydipsia, no polyphagia and no polyuria.   [name], a 45 year old male with a history of uncontrolled diabetes type 2 with neuropathy, GERD and insomnia presents to for a follow up. He says that he has been taking medications consistently.  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 Lajoyce Corners, orthopedic physician. He denies pain at present.   Patient denies polydipsia and visual disturbances.  [name] checks blood sugars at home, he averages 120-140s.    Paitent complains of GERD. This has been associated with cough, early satiety and heartburn. Patient was started on Dexilant 1 month ago with minimal relief. He maintains that he started taking Zantac per his sister with moderate relief. He states that GERD is occurring more frequently and he has periodic vomiting. He also has a family history of esophageal cancer.  He denies chest pain, choking on food, difficulty swallowing, dysphagia, hematemesis and shortness of breath. Symptoms have been present for several months. He denies dysphagia.  He has not lost weight. He denies melena, hematochezia, hematemesis, and coffee ground emesis. He has been taking Omeprazole consistently with minimal relief.  Past Medical History  Diagnosis Date  . Peripheral neuropathy (HCC)   . Acute osteomyelitis of metatarsal bone of left foot (HCC) 11/12/2015  . Diabetic peripheral neuropathy associated with type 2 diabetes mellitus (HCC) 08/30/2008    Qualifier: Diagnosis of  By: Daphine Deutscher FNP, Zena Amos    . Closed fracture of right tibial plateau 11/11/2015  . Vitamin D deficiency 11/12/2015  .  Osteoporosis 11/12/2015  . GERD (gastroesophageal reflux disease)   . Diabetes mellitus     INSULIN DEPENDENT Type II  . Diabetic ulcer of left foot (HCC) 11/14/2015  . Shortness of breath dyspnea     with exertion   Immunization History  Administered Date(s) Administered  . Influenza Whole 09/17/2009  . Pneumococcal Polysaccharide-23 07/13/2004, 08/30/2008  . Tdap 11/10/2015   Social History   Social History  . Marital Status: Divorced    Spouse Name: N/A  . Number of Children: N/A  . Years of Education: N/A   Occupational History  . Not on file.   Social History Main Topics  . Smoking status: Never Smoker   . Smokeless tobacco: Never Used  . Alcohol Use: No  . Drug Use: No  . Sexual Activity: Yes   Other Topics Concern  . Not on file   Social History Narrative  No Known Allergies   Immunization History  Administered Date(s) Administered  . Influenza Whole 09/17/2009  . Pneumococcal Polysaccharide-23 07/13/2004, 08/30/2008  . Tdap 11/10/2015   Review of Systems  Constitutional: Negative for fatigue and unexpected weight change.  HENT: Negative.  Negative for sore throat.   Respiratory: Negative.  Negative for shortness of breath.   Cardiovascular: Negative.   Gastrointestinal: Negative.  Negative for vomiting.  Endocrine: Negative for polydipsia, polyphagia and polyuria.  Musculoskeletal: Negative.   Skin: Negative.  Negative for wound.  Hematological: Negative.   Psychiatric/Behavioral: Negative.  Negative for suicidal ideas and sleep disturbance. The patient is not nervous/anxious.        Objective:   Physical Exam  Constitutional: He  is oriented to person, place, and time. He appears well-developed and well-nourished.  HENT:  Head: Normocephalic and atraumatic.  Right Ear: External ear normal.  Left Ear: External ear normal.  Mouth/Throat: Oropharynx is clear and moist.  Eyes: Conjunctivae and EOM are normal. Pupils are equal, round, and reactive  to light. No scleral icterus.  Neck: Trachea normal and normal range of motion. Neck supple.  Cardiovascular: Normal rate, regular rhythm, normal heart sounds and intact distal pulses.   Pulmonary/Chest: Effort normal and breath sounds normal.  Abdominal: Soft. Bowel sounds are normal.  Musculoskeletal: Normal range of motion.  Neurological: He is alert and oriented to person, place, and time. He has normal reflexes.  Skin: Skin is warm, dry and intact.  Healing scars to bilateral lower extremities.   Dressing to left foot clean, dry, and intact. Patient refuses to remove dressing for assessment.     Psychiatric: He has a normal mood and affect. His speech is normal and behavior is normal. Judgment and thought content normal.      BP 119/81 mmHg  Pulse 89  Temp(Src) 98.3 F (36.8 C) (Oral)  Resp 14  Ht  (1.88 m)  Wt 195 lb (88.451 kg)  BMI 25.03 kg/m2 Assessment & Plan:  1. Type 2 diabetes mellitus with diabetic polyneuropathy, with long-term current use of insulin [facility])  [name] checks blood sugars at home, he averages 160-180s in am and 140s 2 hours after meals.   Patient will bring glucometer and medications to follow up appointment in 3 months.  - HgB A1c - COMPLETE METABOLIC PANEL WITH GFR  2. Insomnia Patient is having difficulty falling asleep and staying asleep. Patient reports that Remus Loffler is not effective. He has been getting 2-3 hours of interrupted sleep. Will start a trial of Amitriptyline 10 mg.  - amitriptyline (ELAVIL) 10 MG tablet; Take 1 tablet (10 mg total) by mouth at bedtime.  Dispense: 30 tablet; Refill: 0  3. Gastroesophageal reflux disease without esophagitis Patient states that Dexilant is no longer effective. Given sample of Dexilant 1 month ago. Patient was unsuccessful on Dexilant trial. He states that he took Zantac, which was moderately effective. Will give an Rx for Zantac. Patient warrants a referral to gastroenterology at this juncture.    - ranitidine (ZANTAC) 150 MG tablet; Take 1 tablet (150 mg total) by mouth 2 (two) times daily.  Dispense: 60 tablet; Refill: 0 - Ambulatory referral to Gastroenterology  4. Erectile dysfunction, unspecified erectile dysfunction type - Testosterone, Total & Free Direct - PSA - POCT urinalysis dipstick   Berlinda Farve M, FNP   RTC: 3 months for DMII, neuropathy, insomnia, and GERD  The patient was given clear instructions to go to ER or return to medical center if symptoms do not improve, worsen or new problems develop. The patient verbalized understanding. Will notify patient with laboratory results.

## 2016-01-22 ENCOUNTER — Encounter: Payer: Self-pay | Admitting: Family Medicine

## 2016-01-22 LAB — COMPLETE METABOLIC PANEL WITH GFR
ALT: 13 U/L (ref 9–46)
AST: 14 U/L (ref 10–40)
Albumin: 4.2 g/dL (ref 3.6–5.1)
Alkaline Phosphatase: 52 U/L (ref 40–115)
BUN: 9 mg/dL (ref 7–25)
CALCIUM: 9.5 mg/dL (ref 8.6–10.3)
CHLORIDE: 100 mmol/L (ref 98–110)
CO2: 24 mmol/L (ref 20–31)
Creat: 0.64 mg/dL (ref 0.60–1.35)
GFR, Est Non African American: >89 mL/min (ref >=60)
GLUCOSE: 131 mg/dL — AB (ref 65–99)
POTASSIUM: 4 mmol/L (ref 3.5–5.3)
Sodium: 135 mmol/L (ref 135–146)
TOTAL PROTEIN: 6.9 g/dL (ref 6.1–8.1)
Total Bilirubin: 1 mg/dL (ref 0.2–1.2)

## 2016-01-22 LAB — HEMOGLOBIN A1C
HEMOGLOBIN A1C: 8 % — AB (ref <5.7)
Mean Plasma Glucose: 183 mg/dL — ABNORMAL HIGH (ref <117)

## 2016-01-22 LAB — PSA: PSA: 0.11 ng/mL (ref <=4.00)

## 2016-01-26 LAB — TESTOSTERONE, FREE, TOTAL, SHBG
SEX HORMONE BINDING: 47 nmol/L (ref 10–50)
TESTOSTERONE FREE: 61.4 pg/mL (ref 47.0–244.0)
TESTOSTERONE-% FREE: 1.6 % (ref 1.6–2.9)
TESTOSTERONE: 382 ng/dL (ref 250–827)

## 2016-01-27 ENCOUNTER — Telehealth: Payer: Self-pay

## 2016-01-27 NOTE — Telephone Encounter (Signed)
-----   Message from [name], [place] sent at 01/27/2016  7:18 AM EST ----- Regarding: lab results Please inform Mr. Doenges that testosterone and PSA are within normal limits. Will need to have patient follow up for a digital rectal examination. Please schedule a follow-up.   Thanks ----- Message -----    From: [facility]    Sent: 01/22/2016   2:44 AM      To: [name], FNP

## 2016-01-27 NOTE — Telephone Encounter (Signed)
Called and left message for patient to return call regarding labs. Thanks!  

## 2016-01-28 NOTE — Telephone Encounter (Signed)
Called. No answer. Left message to return call. Thanks!

## 2016-01-29 NOTE — Telephone Encounter (Signed)
Have tried to call x 3 with no answer. Will mail letter to patient. Thanks!

## 2016-02-04 ENCOUNTER — Telehealth: Payer: Self-pay | Admitting: *Deleted

## 2016-02-04 DIAGNOSIS — Z794 Long term (current) use of insulin: Secondary | ICD-10-CM

## 2016-02-04 DIAGNOSIS — E1142 Type 2 diabetes mellitus with diabetic polyneuropathy: Secondary | ICD-10-CM

## 2016-02-04 DIAGNOSIS — K219 Gastro-esophageal reflux disease without esophagitis: Secondary | ICD-10-CM

## 2016-02-04 MED ORDER — INSULIN ASPART 100 UNIT/ML FLEXPEN: 0.0000 [IU] | PEN_INJECTOR | Freq: Three times a day (TID) | SUBCUTANEOUS | Status: DC

## 2016-02-04 MED ORDER — RANITIDINE HCL 150 MG PO TABS: 150.0000 mg | ORAL_TABLET | Freq: Two times a day (BID) | ORAL | Status: DC

## 2016-02-04 MED ORDER — INSULIN DETEMIR 100 UNIT/ML ~~LOC~~ SOLN: 20.0000 [IU] | Freq: Every day | SUBCUTANEOUS | Status: DC

## 2016-02-04 MED ORDER — METFORMIN HCL 500 MG PO TABS: 500.0000 mg | ORAL_TABLET | Freq: Two times a day (BID) | ORAL | Status: DC

## 2016-02-04 NOTE — Telephone Encounter (Signed)
[facility] 02[facility]04[facility]2016. Thanks!

## 2016-02-04 NOTE — Telephone Encounter (Signed)
Pt called and needs a refill of his medications. Metformin, Levemir, Zantac, Novolog, [facility] pharmacy [facility]. Please advise provider. Thanks

## 2016-02-10 ENCOUNTER — Ambulatory Visit: Payer: No Typology Code available for payment source | Admitting: Family Medicine

## 2016-02-18 MED FILL — DULoxetine HCL 20 MG CPEP: 20 | 10 days supply | Qty: 30 | Fill #0

## 2016-02-18 MED FILL — NOVOLOG FLEXPEN SYRINGE: 100 | 30 days supply | Qty: 15 | Fill #0

## 2016-02-18 MED FILL — raNITIdine HCL 150 MG TABS: 150 | 30 days supply | Qty: 60 | Fill #0

## 2016-02-18 MED FILL — metFORMIN HCL 500 MG TABS: 500 | 30 days supply | Qty: 60 | Fill #0

## 2016-02-18 MED FILL — LEVEMIR 100 UNITS/ML VIAL: 100 | 30 days supply | Qty: 10 | Fill #1

## 2016-03-04 ENCOUNTER — Ambulatory Visit (INDEPENDENT_AMBULATORY_CARE_PROVIDER_SITE_OTHER): Payer: Medicaid Other | Admitting: Family Medicine

## 2016-03-04 ENCOUNTER — Encounter: Payer: Self-pay | Admitting: Family Medicine

## 2016-03-04 VITALS — BP 131/78 | HR 102 | Temp 97.9°F | Resp 16 | Ht 74.0 in | Wt 203.0 lb

## 2016-03-04 DIAGNOSIS — E1142 Type 2 diabetes mellitus with diabetic polyneuropathy: Secondary | ICD-10-CM

## 2016-03-04 DIAGNOSIS — H539 Unspecified visual disturbance: Secondary | ICD-10-CM | POA: Diagnosis not present

## 2016-03-04 DIAGNOSIS — E1165 Type 2 diabetes mellitus with hyperglycemia: Secondary | ICD-10-CM | POA: Diagnosis not present

## 2016-03-04 NOTE — Progress Notes (Signed)
Subjective:    Patient ID: Travis CabalMichael T Arenas, male    DOB: 04/14/1971, 45 y.o.   MRN: [id number]  Eye Problem  Both (Patient reports that vision has been blurred over the past week. ) eyes are affected.This is a recurrent problem. The current episode started in the past 7 days. The problem occurs daily. The problem has been gradually worsening. The pain is at a severity of 0/10. Associated symptoms include blurred vision, a foreign body sensation and photophobia. Pertinent negatives include no eye discharge, double vision, eye redness, fever, itching, recent URI, vomiting or weakness. He has tried nothing for the symptoms.  Diabetes He presents for his follow-up diabetic visit. He has type 2 diabetes mellitus. His disease course has been stable. Associated symptoms include blurred vision, foot ulcerations and visual change. Pertinent negatives for diabetes include no chest pain, no fatigue, no foot paresthesias, no polydipsia, no polyphagia, no polyuria, no weakness and no weight loss. Symptoms are worsening. Diabetic complications include peripheral neuropathy. Risk factors for coronary artery disease include diabetes mellitus and dyslipidemia. He is following a diabetic diet. Meal planning includes avoidance of concentrated sweets and calorie counting. He has not had a previous visit with a dietitian. He never participates in exercise. He does not see a podiatrist.Eye exam is not current.    Past Medical History  Diagnosis Date  . Peripheral neuropathy (HCC)   . Acute osteomyelitis of metatarsal bone of left foot (HCC) 11/12/2015  . Diabetic peripheral neuropathy associated with type 2 diabetes mellitus (HCC) 08/30/2008    Qualifier: Diagnosis of  By: Daphine DeutscherMartin FNP, Zena AmosNykedtra    . Closed fracture of right tibial plateau 11/11/2015  . Vitamin D deficiency 11/12/2015  . Osteoporosis 11/12/2015  . GERD (gastroesophageal reflux disease)   . Diabetes mellitus     INSULIN DEPENDENT Type II  . Diabetic  ulcer of left foot (HCC) 11/14/2015  . Shortness of breath dyspnea     with exertion   Immunization History  Administered Date(s) Administered  . Influenza Whole 09/17/2009  . Pneumococcal Polysaccharide-23 07/13/2004, 08/30/2008  . Tdap 11/10/2015   Social History   Social History  . Marital Status: Divorced    Spouse Name: N/A  . Number of Children: N/A  . Years of Education: N/A   Occupational History  . Not on file.   Social History Main Topics  . Smoking status: Never Smoker   . Smokeless tobacco: Never Used  . Alcohol Use: No  . Drug Use: No  . Sexual Activity: Yes   Other Topics Concern  . Not on file   Social History Narrative  No Known Allergies   Immunization History  Administered Date(s) Administered  . Influenza Whole 09/17/2009  . Pneumococcal Polysaccharide-23 07/13/2004, 08/30/2008  . Tdap 11/10/2015   Past Surgical History  Procedure Laterality Date  . Knee surgery Left   . Shoulder surgery Right   . Toe amputation Left 11/28/15    4th toe  . 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  HENT: Negative.   Eyes: Positive for blurred vision and photophobia. Negative for double vision, discharge and redness.  Respiratory: Negative.  Negative for shortness of breath.   Cardiovascular: Negative.  Negative for chest pain.  Gastrointestinal: Negative.  Negative for vomiting.  Endocrine:  Negative for polydipsia, polyphagia and polyuria.  Musculoskeletal: Negative.   Skin: Negative.  Negative for itching and wound.  Neurological: Negative for weakness.  Hematological: Negative.   Psychiatric/Behavioral: Negative.  Negative for suicidal ideas and sleep disturbance.       Objective:   Physical Exam  Constitutional: He is oriented to  person, place, and time. He appears well-developed and well-nourished.  HENT:  Head: Normocephalic and atraumatic.  Right Ear: External ear normal.  Left Ear: External ear normal.  Mouth/Throat: Oropharynx is clear and moist.  Eyes: Conjunctivae and EOM are normal. Pupils are equal, round, and reactive to light. Right eye exhibits no discharge. Left eye exhibits exudate. Left eye exhibits no discharge. No scleral icterus.  Fundoscopic exam:      The right eye shows no exudate. The right eye shows red reflex.       The left eye shows no exudate. The left eye shows red reflex.  Neck: Trachea normal and normal range of motion. Neck supple.  Cardiovascular: Normal rate, regular rhythm, normal heart sounds and intact distal pulses.   Pulmonary/Chest: Effort normal and breath sounds normal.  Abdominal: Soft. Bowel sounds are normal.  Musculoskeletal: Normal range of motion.  Neurological: He is alert and oriented to person, place, and time. He has normal reflexes.  Skin: Skin is warm, dry and intact.  Healing scars to bilateral lower extremities.     Psychiatric: He has a normal mood and affect. His speech is normal and behavior is normal. Judgment and thought content normal.      BP 131/78 mmHg  Pulse 102  Temp(Src) 97.9 F (36.6 C) (Oral)  Resp 16  Ht  (1.88 m)  Wt 203 lb (92.08 kg)  BMI 26.05 kg/m2 Assessment & Plan:  1. Type 2 diabetes mellitus with diabetic polyneuropathy, with long-term current use of insulin [facility])  Mr. Nham checks blood sugars at home, he averages 160-180s in am and 140s 2 hours after meals.   Patient will bring glucometer and medications to follow up appointment in 3 months.  Reviewed previous laboratory results, hemoglobin a1C improved to 8%. Continue carbohydrate modified diet.     2. Visual disturbance Patient is complaining of blurred vision bilaterally. He states that he feels as if he is peering through a stained glass. Snellen eye test shows  shows 20/50 OS and 20/30 OD. I will defer to opthalmology for further evaluation and treatment. I suspect that changes in vision are related to hx of uncontrolled type 2 diabetes mellitus.  I sent a referral to opthalmology in November 2016. I called [facility], patient has an appointment scheduled on March 22, 2016 at 10 am. They will notify patient if an earlier appointment becomes available.     Tatsuya Okray M, FNP   RTC: 3 months for DMII   The patient was given clear instructions to go to ER or return to medical center if symptoms do not improve, worsen or new problems develop. The patient verbalized understanding. Will notify patient with laboratory results.

## 2016-03-04 NOTE — Patient Instructions (Addendum)
[facility] Monday, March 22, 2016 at 10 am  Blurred Vision Having blurred vision means that you cannot see things clearly. Your vision may seem fuzzy or out of focus. Blurred vision is a very common symptom of an eye or vision problem. Blurred vision is often a gradual blur that occurs in one eye or both eyes. There are many causes of blurred vision, including cataracts, macular degeneration, and diabetic retinopathy. Blurred vision can be diagnosed based on your symptoms and a physical exam. Tell your health care provider about any other health problems you have, any recent eye injury, and any prior surgeries. You may need to see a health care provider who specializes in eye problems (ophthalmologist). Your treatment depends on what is causing your blurred vision.  HOME CARE INSTRUCTIONS  Tell your health care provider about any changes in your blurred vision.  Do not drive or operate heavy machinery if your vision is blurry.  Keep all follow-up visits as directed by your health care provider. This is important. SEEK MEDICAL CARE IF:  Your symptoms get worse.  You have new symptoms.  You have trouble seeing at night.  You have trouble seeing up close or far away.  You have trouble noticing the difference between colors. SEEK IMMEDIATE MEDICAL CARE IF:  You have severe eye pain.  You have a severe headache.  You have flashing lights in your field of vision.  You have a sudden change in vision.  You have a sudden loss of vision.  You have vision change after an injury.  You notice drainage coming from your eyes.  You notice a rash around your eyes.   This information is not intended to replace advice given to you by your health care provider. Make sure you discuss any questions you have with your health care provider.   Document Released: 12/02/2003 Document Revised: 04/15/2015 Document Reviewed: 10/23/2014 Elsevier Interactive Patient Education 2016 [place]. Diabetes and Foot Care Diabetes may cause you to have problems because of poor blood supply (circulation) to your feet and legs. This may cause the skin on your feet to become thinner, break easier, and heal more slowly. Your skin may become dry, and the skin may peel and crack. You may also have nerve damage in your legs and feet causing decreased feeling in them. You may not notice minor injuries to your feet that could lead to infections or more serious problems. Taking care of your feet is one of the most important things you can do for yourself.  HOME CARE INSTRUCTIONS  Wear shoes at all times, even in the house. Do not go barefoot. Bare feet are easily injured.  Check your feet daily for blisters, cuts, and redness. If you cannot see the bottom of your feet, use a mirror or ask someone for help.  Wash your feet with warm water (do not use hot water) and mild soap. Then pat your feet and the areas between your toes until they are completely dry. Do not soak your feet as this can dry your skin.  Apply a moisturizing lotion or petroleum jelly (that does not contain alcohol and is unscented) to the skin on your feet and to dry, brittle toenails. Do not apply lotion between your toes.  Trim your toenails straight across. Do not dig under them or around the cuticle. File the edges of your nails with an emery board or nail file.  Do not cut corns or calluses or try to remove them  with medicine.  Wear clean socks or stockings every day. Make sure they are not too tight. Do not wear knee-high stockings since they may decrease blood flow to your legs.  Wear shoes that fit properly and have enough cushioning. To break in new shoes, wear them for just a few hours a day. This prevents you from injuring your feet. Always look in your shoes before you put them on to be sure there are no objects inside.  Do not cross your legs. This may decrease the blood flow to your feet.  If you find a minor  scrape, cut, or break in the skin on your feet, keep it and the skin around it clean and dry. These areas may be cleansed with mild soap and water. Do not cleanse the area with peroxide, alcohol, or iodine.  When you remove an adhesive bandage, be sure not to damage the skin around it.  If you have a wound, look at it several times a day to make sure it is healing.  Do not use heating pads or hot water bottles. They may burn your skin. If you have lost feeling in your feet or legs, you may not know it is happening until it is too late.  Make sure your health care provider performs a complete foot exam at least annually or more often if you have foot problems. Report any cuts, sores, or bruises to your health care provider immediately. SEEK MEDICAL CARE IF:   You have an injury that is not healing.  You have cuts or breaks in the skin.  You have an ingrown nail.  You notice redness on your legs or feet.  You feel burning or tingling in your legs or feet.  You have pain or cramps in your legs and feet.  Your legs or feet are numb.  Your feet always feel cold. SEEK IMMEDIATE MEDICAL CARE IF:   There is increasing redness, swelling, or pain in or around a wound.  There is a red line that goes up your leg.  Pus is coming from a wound.  You develop a fever or as directed by your health care provider.  You notice a bad smell coming from an ulcer or wound.   This information is not intended to replace advice given to you by your health care provider. Make sure you discuss any questions you have with your health care provider.   Document Released: 11/26/2000 Document Revised: 08/01/2013 Document Reviewed: 05/08/2013 Elsevier Interactive Patient Education 2016 [place]. Diabetes and Standards of Medical Care Diabetes is complicated. You may find that your diabetes team includes a dietitian, nurse, diabetes educator, eye doctor, and more. To help everyone know what is going on  and to help you get the care you deserve, the following schedule of care was developed to help keep you on track. Below are the tests, exams, vaccines, medicines, education, and plans you will need. HbA1c test This test shows how well you have controlled your glucose over the past 2-3 months. It is used to see if your diabetes management plan needs to be adjusted.   It is performed at least 2 times a year if you are meeting treatment goals.  It is performed 4 times a year if therapy has changed or if you are not meeting treatment goals. Blood pressure test  This test is performed at every routine medical visit. The goal is less than 140/90 mm Hg for most people, but 130/80 mm Hg in  some cases. Ask your health care provider about your goal. Dental exam  Follow up with the dentist regularly. Eye exam  If you are diagnosed with type 1 diabetes as a child, get an exam upon reaching the age of 68 years or older and having had diabetes for 3-5 years. Yearly eye exams are recommended after that initial eye exam.  If you are diagnosed with type 1 diabetes as an adult, get an exam within 5 years of diagnosis and then yearly.  If you are diagnosed with type 2 diabetes, get an exam as soon as possible after the diagnosis and then yearly. Foot care exam  Visual foot exams are performed at every routine medical visit. The exams check for cuts, injuries, or other problems with the feet.  You should have a complete foot exam performed every year. This exam includes an inspection of the structure and skin of your feet, a check of the pulses in your feet, and a check of the sensation in your feet.  Type 1 diabetes: The first exam is performed 5 years after diagnosis.  Type 2 diabetes: The first exam is performed at the time of diagnosis.  Check your feet nightly for cuts, injuries, or other problems with your feet. Tell your health care provider if anything is not healing. Kidney function test (urine  microalbumin)  This test is performed once a year.  Type 1 diabetes: The first test is performed 5 years after diagnosis.  Type 2 diabetes: The first test is performed at the time of diagnosis.  A serum creatinine and estimated glomerular filtration rate (eGFR) test is done once a year to assess the level of chronic kidney disease (CKD), if present. Lipid profile (cholesterol, HDL, LDL, triglycerides)  Performed every 5 years for most people.  The goal for LDL is less than 100 mg/dL. If you are at high risk, the goal is less than 70 mg/dL.  The goal for HDL is 40 mg/dL-50 mg/dL for men and 50 mg/dL-60 mg/dL for women. An HDL cholesterol of 60 mg/dL or higher gives some protection against heart disease.  The goal for triglycerides is less than 150 mg/dL. Immunizations  The flu (influenza) vaccine is recommended yearly for every person 77 months of age or older who has diabetes.  The pneumonia (pneumococcal) vaccine is recommended for every person 81 years of age or older who has diabetes. Adults 75 years of age or older may receive the pneumonia vaccine as a series of two separate shots.  The hepatitis B vaccine is recommended for adults shortly after they have been diagnosed with diabetes.  The Tdap (tetanus, diphtheria, and pertussis) vaccine should be given:  According to normal childhood vaccination schedules, for children.  Every 10 years, for adults who have diabetes. Diabetes self-management education  Education is recommended at diagnosis and ongoing as needed. Treatment plan  Your treatment plan is reviewed at every medical visit.   This information is not intended to replace advice given to you by your health care provider. Make sure you discuss any questions you have with your health care provider.   Document Released: 09/26/2009 Document Revised: 12/20/2014 Document Reviewed: 05/01/2013 Elsevier Interactive Patient Education 2016 [place] Having blurred vision means that you cannot see things clearly. Your vision may seem fuzzy or out of focus. Blurred vision is a very common symptom of an eye or vision problem. Blurred vision is often a gradual blur that occurs in one eye or both eyes.  There are many causes of blurred vision, including cataracts, macular degeneration, and diabetic retinopathy. Blurred vision can be diagnosed based on your symptoms and a physical exam. Tell your health care provider about any other health problems you have, any recent eye injury, and any prior surgeries. You may need to see a health care provider who specializes in eye problems (ophthalmologist). Your treatment depends on what is causing your blurred vision.  HOME CARE INSTRUCTIONS  Tell your health care provider about any changes in your blurred vision.  Do not drive or operate heavy machinery if your vision is blurry.  Keep all follow-up visits as directed by your health care provider. This is important. SEEK MEDICAL CARE IF:  Your symptoms get worse.  You have new symptoms.  You have trouble seeing at night.  You have trouble seeing up close or far away.  You have trouble noticing the difference between colors. SEEK IMMEDIATE MEDICAL CARE IF:  You have severe eye pain.  You have a severe headache.  You have flashing lights in your field of vision.  You have a sudden change in vision.  You have a sudden loss of vision.  You have vision change after an injury.  You notice drainage coming from your eyes.  You notice a rash around your eyes.   This information is not intended to replace advice given to you by your health care provider. Make sure you discuss any questions you have with your health care provider.   Document Released: 12/02/2003 Document Revised: 04/15/2015 Document Reviewed: 10/23/2014 Elsevier Interactive Patient Education 2016 [employer]. Diabetic Retinopathy Diabetic retinopathy is a disease of  the light-sensitive membrane at the back of the eye (retina). It is a complication of diabetes and a common cause of blindness. Early detection of the disease is key to keeping your eyes healthy.  CAUSES  Diabetic retinopathy is caused by blood sugar (glucose) levels that are too high over an extended period of time. High blood sugars cause damage to the small blood vessels of the retina, allowing blood to leak through the vessel walls. This causes visual impairment and eventually blindness. RISK FACTORS  High blood pressure.  Having diabetes for a long time.  Having poorly controlled blood sugars. SIGNS AND SYMPTOMS  In the early stages of diabetic retinopathy, there are often no symptoms. As the condition advances, symptoms may include:  Blurred vision. This is usually caused by a swelling due to abnormal blood glucose levels. The blurriness may go away when blood glucose levels return to normal.  Moving specks or dark spots (floaters) in your vision. These can be caused by a small retinal hemorrhage. A hemorrhage is bleeding from blood vessels.  Missing parts of your field of vision, such as things at the side. This can be caused by larger retinal hemorrhages.  Difficulty reading books or signs.  Double vision.  Pain in one or both eyes.  Feeling pressure in one or both eyes.  Trouble seeing straight lines. Straight lines do not look straight.  Redness of the eyes that does not go away. DIAGNOSIS  Your eye care specialist can detect changes in the blood vessels of your eye by putting drops in your eyes that enlarge (dilate) your pupils. This allows your eye care specialist to get a good look at your retina to see if there are any changes that have occurred as a result of your diabetes. You should have your eyes examined once a year. TREATMENT  Your eye care  specialist may use a special laser beam to seal the blood vessels of the retina and stop them from leaking. Early detection  and treatment are important so that further damage to your eyes can be prevented. In addition, managing your blood sugars and keeping them in the target range can slow the progress of the disease. HOME CARE INSTRUCTIONS   Keep your blood pressure within your target range.  Keep your blood glucose levels within your target range.  Follow your health care provider's instructions regarding diet and other means for controlling your blood glucose levels.  Check your blood levels for glucose as recommended by your health care provider.  Keep regular appointments with your eye specialist. An eye specialist can usually see diabetic retinopathy developing long before it starts causing problems. In many cases, it can be treated to prevent complications from occurring. If you have diabetes, you should have your eyes checked at least every year. Your risk of retinopathy increases the longer you have the disease.  If you smoke, quit. Ask your health care provider for help if needed. Smoking can make retinopathy worse. SEEK MEDICAL CARE IF:   You notice gradual blurring or other changes in your vision over time.  You notice that your glasses or contact lenses do not make things look as sharp as they once did.  You have trouble reading or seeing details at a distance with either eye.  You notice a sudden change in your vision or notice that parts of your field of vision appear missing or hazy.  You suddenly see moving specks or dark spots in the field of vision of either eye.  You have sudden partial loss of vision in either eye.   This information is not intended to replace advice given to you by your health care provider. Make sure you discuss any questions you have with your health care provider.   Document Released: 11/26/2000 Document Revised: 09/19/2013 Document Reviewed: 05/21/2013 Elsevier Interactive Patient Education 2016 [employer]. Diabetes Mellitus and Food It is important for you  to manage your blood sugar (glucose) level. Your blood glucose level can be greatly affected by what you eat. Eating healthier foods in the appropriate amounts throughout the day at about the same time each day will help you control your blood glucose level. It can also help slow or prevent worsening of your diabetes mellitus. Healthy eating may even help you improve the level of your blood pressure and reach or maintain a healthy weight.  General recommendations for healthful eating and cooking habits include:  Eating meals and snacks regularly. Avoid going long periods of time without eating to lose weight.  Eating a diet that consists mainly of plant-based foods, such as fruits, vegetables, nuts, legumes, and whole grains.  Using low-heat cooking methods, such as baking, instead of high-heat cooking methods, such as deep frying. Work with your dietitian to make sure you understand how to use the Nutrition Facts information on food labels. HOW CAN FOOD AFFECT ME? Carbohydrates Carbohydrates affect your blood glucose level more than any other type of food. Your dietitian will help you determine how many carbohydrates to eat at each meal and teach you how to count carbohydrates. Counting carbohydrates is important to keep your blood glucose at a healthy level, especially if you are using insulin or taking certain medicines for diabetes mellitus. Alcohol Alcohol can cause sudden decreases in blood glucose (hypoglycemia), especially if you use insulin or take certain medicines for diabetes mellitus. Hypoglycemia can  be a life-threatening condition. Symptoms of hypoglycemia (sleepiness, dizziness, and disorientation) are similar to symptoms of having too much alcohol.  If your health care provider has given you approval to drink alcohol, do so in moderation and use the following guidelines:  Women should not have more than one drink per day, and men should not have more than two drinks per day. One  drink is equal to:  12 oz of beer.  5 oz of wine.  1 oz of hard liquor.  Do not drink on an empty stomach.  Keep yourself hydrated. Have water, diet soda, or unsweetened iced tea.  Regular soda, juice, and other mixers might contain a lot of carbohydrates and should be counted. WHAT FOODS ARE NOT RECOMMENDED? As you make food choices, it is important to remember that all foods are not the same. Some foods have fewer nutrients per serving than other foods, even though they might have the same number of calories or carbohydrates. It is difficult to get your body what it needs when you eat foods with fewer nutrients. Examples of foods that you should avoid that are high in calories and carbohydrates but low in nutrients include:  Trans fats (most processed foods list trans fats on the Nutrition Facts label).  Regular soda.  Juice.  Candy.  Sweets, such as cake, pie, doughnuts, and cookies.  Fried foods. WHAT FOODS CAN I EAT? Eat nutrient-rich foods, which will nourish your body and keep you healthy. The food you should eat also will depend on several factors, including:  The calories you need.  The medicines you take.  Your weight.  Your blood glucose level.  Your blood pressure level.  Your cholesterol level. You should eat a variety of foods, including:  Protein.  Lean cuts of meat.  Proteins low in saturated fats, such as fish, egg whites, and beans. Avoid processed meats.  Fruits and vegetables.  Fruits and vegetables that may help control blood glucose levels, such as apples, mangoes, and yams.  Dairy products.  Choose fat-free or low-fat dairy products, such as milk, yogurt, and cheese.  Grains, bread, pasta, and rice.  Choose whole grain products, such as multigrain bread, whole oats, and brown rice. These foods may help control blood pressure.  Fats.  Foods containing healthful fats, such as nuts, avocado, olive oil, canola oil, and fish. DOES  EVERYONE WITH DIABETES MELLITUS HAVE THE SAME MEAL PLAN? Because every person with diabetes mellitus is different, there is not one meal plan that works for everyone. It is very important that you meet with a dietitian who will help you create a meal plan that is just right for you.   This information is not intended to replace advice given to you by your health care provider. Make sure you discuss any questions you have with your health care provider.   Document Released: 08/26/2005 Document Revised: 12/20/2014 Document Reviewed: 10/26/2013 Elsevier Interactive Patient Education [employer].

## 2016-03-10 ENCOUNTER — Ambulatory Visit: Payer: No Typology Code available for payment source | Admitting: Gastroenterology

## 2016-03-22 LAB — HM DIABETES EYE EXAM

## 2016-03-23 ENCOUNTER — Telehealth: Payer: Self-pay

## 2016-03-23 DIAGNOSIS — K219 Gastro-esophageal reflux disease without esophagitis: Secondary | ICD-10-CM

## 2016-03-23 DIAGNOSIS — E1142 Type 2 diabetes mellitus with diabetic polyneuropathy: Secondary | ICD-10-CM

## 2016-03-23 DIAGNOSIS — Z794 Long term (current) use of insulin: Secondary | ICD-10-CM

## 2016-03-23 MED ORDER — RANITIDINE HCL 150 MG PO TABS: 150.0000 mg | ORAL_TABLET | Freq: Two times a day (BID) | ORAL | Status: DC

## 2016-03-23 MED ORDER — METFORMIN HCL 500 MG PO TABS: 500.0000 mg | ORAL_TABLET | Freq: Two times a day (BID) | ORAL | Status: DC

## 2016-03-23 MED ORDER — INSULIN ASPART 100 UNIT/ML FLEXPEN: 0.0000 [IU] | PEN_INJECTOR | Freq: Three times a day (TID) | SUBCUTANEOUS | Status: DC

## 2016-03-23 MED ORDER — INSULIN DETEMIR 100 UNIT/ML ~~LOC~~ SOLN: 20.0000 [IU] | Freq: Every day | SUBCUTANEOUS | Status: DC

## 2016-03-23 NOTE — Telephone Encounter (Signed)
This has been sent to pharmacy. Thanks!  

## 2016-03-23 NOTE — Telephone Encounter (Signed)
Pt is requesting medication refills on Levemir, Novolog, Metformin, and acid reflux medication. [facility] [address] Thanks!

## 2016-04-12 ENCOUNTER — Observation Stay (HOSPITAL_COMMUNITY): Payer: Medicaid Other | Admitting: Certified Registered"

## 2016-04-12 ENCOUNTER — Encounter (HOSPITAL_COMMUNITY)
Admission: EM | Disposition: A | Discharge status: Home / Self Care | Payer: Self-pay | Source: Home / Self Care | Attending: Emergency Medicine

## 2016-04-12 ENCOUNTER — Observation Stay (HOSPITAL_COMMUNITY)
Admission: EM | Admit: 2016-04-12 | Discharge: 2016-04-13 | Disposition: A | Discharge status: Home / Self Care | Payer: Medicaid Other | Attending: Internal Medicine | Admitting: Internal Medicine

## 2016-04-12 ENCOUNTER — Encounter (HOSPITAL_COMMUNITY): Payer: Self-pay

## 2016-04-12 ENCOUNTER — Ambulatory Visit: Payer: Self-pay | Admitting: Ophthalmology

## 2016-04-12 DIAGNOSIS — E872 Acidosis: Secondary | ICD-10-CM | POA: Insufficient documentation

## 2016-04-12 DIAGNOSIS — E1142 Type 2 diabetes mellitus with diabetic polyneuropathy: Secondary | ICD-10-CM | POA: Diagnosis not present

## 2016-04-12 DIAGNOSIS — H2102 Hyphema, left eye: Secondary | ICD-10-CM | POA: Diagnosis present

## 2016-04-12 DIAGNOSIS — E1165 Type 2 diabetes mellitus with hyperglycemia: Secondary | ICD-10-CM | POA: Insufficient documentation

## 2016-04-12 DIAGNOSIS — Z794 Long term (current) use of insulin: Secondary | ICD-10-CM | POA: Insufficient documentation

## 2016-04-12 DIAGNOSIS — H40052 Ocular hypertension, left eye: Secondary | ICD-10-CM

## 2016-04-12 DIAGNOSIS — Z833 Family history of diabetes mellitus: Secondary | ICD-10-CM | POA: Insufficient documentation

## 2016-04-12 DIAGNOSIS — H409 Unspecified glaucoma: Secondary | ICD-10-CM | POA: Insufficient documentation

## 2016-04-12 DIAGNOSIS — H3342 Traction detachment of retina, left eye: Secondary | ICD-10-CM | POA: Insufficient documentation

## 2016-04-12 DIAGNOSIS — K219 Gastro-esophageal reflux disease without esophagitis: Secondary | ICD-10-CM | POA: Diagnosis not present

## 2016-04-12 DIAGNOSIS — Z89432 Acquired absence of left foot: Secondary | ICD-10-CM | POA: Insufficient documentation

## 2016-04-12 DIAGNOSIS — E1143 Type 2 diabetes mellitus with diabetic autonomic (poly)neuropathy: Secondary | ICD-10-CM | POA: Diagnosis present

## 2016-04-12 DIAGNOSIS — H40059 Ocular hypertension, unspecified eye: Secondary | ICD-10-CM | POA: Diagnosis present

## 2016-04-12 DIAGNOSIS — E119 Type 2 diabetes mellitus without complications: Secondary | ICD-10-CM | POA: Diagnosis present

## 2016-04-12 HISTORY — PX: ANTERIOR VITRECTOMY: SHX1173

## 2016-04-12 LAB — CBC WITH DIFFERENTIAL/PLATELET
Basophils Absolute: 0.1 10*3/uL (ref 0.0–0.1)
Basophils Relative: 1 %
EOS PCT: 0 %
Eosinophils Absolute: 0 10*3/uL (ref 0.0–0.7)
HCT: 43.1 % (ref 39.0–52.0)
Hemoglobin: 14.9 g/dL (ref 13.0–17.0)
LYMPHS ABS: 2.2 10*3/uL (ref 0.7–4.0)
LYMPHS PCT: 22 %
MCH: 28.7 pg (ref 26.0–34.0)
MCHC: 34.6 g/dL (ref 30.0–36.0)
MCV: 82.9 fL (ref 78.0–100.0)
MONOS PCT: 7 %
Monocytes Absolute: 0.7 10*3/uL (ref 0.1–1.0)
Neutro Abs: 7 10*3/uL (ref 1.7–7.7)
Neutrophils Relative %: 70 %
PLATELETS: 347 10*3/uL (ref 150–400)
RBC: 5.2 MIL/uL (ref 4.22–5.81)
RDW: 13.3 % (ref 11.5–15.5)
WBC: 10 10*3/uL (ref 4.0–10.5)

## 2016-04-12 LAB — COMPREHENSIVE METABOLIC PANEL
ALBUMIN: 4.4 g/dL (ref 3.5–5.0)
ALT: 21 U/L (ref 17–63)
AST: 18 U/L (ref 15–41)
Alkaline Phosphatase: 65 U/L (ref 38–126)
Anion gap: 12 (ref 5–15)
BUN: 21 mg/dL — AB (ref 6–20)
CHLORIDE: 103 mmol/L (ref 101–111)
CO2: 19 mmol/L — AB (ref 22–32)
Calcium: 9.5 mg/dL (ref 8.9–10.3)
Creatinine, Ser: 1.2 mg/dL (ref 0.61–1.24)
GFR calc Af Amer: >60 mL/min (ref >60)
GFR calc non Af Amer: >60 mL/min (ref >60)
GLUCOSE: 249 mg/dL — AB (ref 65–99)
POTASSIUM: 3.5 mmol/L (ref 3.5–5.1)
Sodium: 134 mmol/L — ABNORMAL LOW (ref 135–145)
Total Bilirubin: 2.4 mg/dL — ABNORMAL HIGH (ref 0.3–1.2)
Total Protein: 7.8 g/dL (ref 6.5–8.1)

## 2016-04-12 LAB — BASIC METABOLIC PANEL
Anion gap: 10 (ref 5–15)
BUN: 22 mg/dL — ABNORMAL HIGH (ref 6–20)
CHLORIDE: 104 mmol/L (ref 101–111)
CO2: 19 mmol/L — ABNORMAL LOW (ref 22–32)
Calcium: 9.5 mg/dL (ref 8.9–10.3)
Creatinine, Ser: 1.04 mg/dL (ref 0.61–1.24)
GFR calc non Af Amer: >60 mL/min (ref >60)
Glucose, Bld: 212 mg/dL — ABNORMAL HIGH (ref 65–99)
POTASSIUM: 3.8 mmol/L (ref 3.5–5.1)
SODIUM: 133 mmol/L — AB (ref 135–145)

## 2016-04-12 LAB — LACTIC ACID, PLASMA: Lactic Acid, Venous: 1 mmol/L (ref 0.5–2.0)

## 2016-04-12 LAB — LIPASE, BLOOD: Lipase: 26 U/L (ref 11–51)

## 2016-04-12 SURGERY — Surgical Case
Anesthesia: *Unknown

## 2016-04-12 SURGERY — VITRECTOMY, ANTERIOR
Anesthesia: Monitor Anesthesia Care | Site: Eye | Laterality: Left

## 2016-04-12 MED ORDER — OFLOXACIN 0.3 % OP SOLN
1.0000 [drp] | Freq: Four times a day (QID) | OPHTHALMIC | Status: DC
  Administered 2016-04-13 (×2): 1 [drp] via OPHTHALMIC
  Filled 2016-04-12: qty 5

## 2016-04-12 MED ORDER — BSS IO SOLN
INTRAOCULAR | Status: AC
  Filled 2016-04-12: qty 500

## 2016-04-12 MED ORDER — BSS IO SOLN
INTRAOCULAR | Status: AC
  Filled 2016-04-12: qty 15

## 2016-04-12 MED ORDER — FENTANYL CITRATE (PF) 100 MCG/2ML IJ SOLN
INTRAMUSCULAR | Status: DC | PRN
  Administered 2016-04-12: 100 ug via INTRAVENOUS
  Administered 2016-04-12 – 2016-04-13 (×3): 50 ug via INTRAVENOUS

## 2016-04-12 MED ORDER — DORZOLAMIDE HCL-TIMOLOL MAL 2-0.5 % OP SOLN: 1.0000 [drp] | Freq: Two times a day (BID) | OPHTHALMIC | Status: DC

## 2016-04-12 MED ORDER — INSULIN DETEMIR 100 UNIT/ML ~~LOC~~ SOLN: 20.0000 [IU] | Freq: Every day | SUBCUTANEOUS | Status: DC

## 2016-04-12 MED ORDER — OFLOXACIN 0.3 % OP SOLN: 1.0000 [drp] | Freq: Four times a day (QID) | OPHTHALMIC | Status: DC

## 2016-04-12 MED ORDER — MIDAZOLAM HCL 5 MG/5ML IJ SOLN
INTRAMUSCULAR | Status: DC | PRN
  Administered 2016-04-12: 2 mg via INTRAVENOUS

## 2016-04-12 MED ORDER — SODIUM CHLORIDE 0.9 % IV SOLN
INTRAVENOUS | Status: DC | PRN
Start: 2016-04-12 — End: 2016-04-13
  Administered 2016-04-12 – 2016-04-13 (×2): via INTRAVENOUS

## 2016-04-12 MED ORDER — LIDOCAINE HCL 2 % IJ SOLN
INTRAMUSCULAR | Status: DC | PRN
  Administered 2016-04-12: 5 mL via RETROBULBAR

## 2016-04-12 MED ORDER — POVIDONE-IODINE 10 % EX SOLN
CUTANEOUS | Status: DC | PRN
  Filled 2016-04-12: qty 118

## 2016-04-12 MED ORDER — GENTAMICIN SULFATE 40 MG/ML IJ SOLN
INTRAMUSCULAR | Status: AC
  Filled 2016-04-12: qty 2

## 2016-04-12 MED ORDER — SODIUM CHLORIDE 0.9 % IV SOLN
INTRAVENOUS | Status: DC
Start: 2016-04-12 — End: 2016-04-13

## 2016-04-12 MED ORDER — MIDAZOLAM HCL 2 MG/2ML IJ SOLN
INTRAMUSCULAR | Status: AC
  Filled 2016-04-12: qty 2

## 2016-04-12 MED ORDER — BSS IO SOLN
INTRAOCULAR | Status: AC
  Filled 2016-04-12: qty 30

## 2016-04-12 MED ORDER — INSULIN DETEMIR 100 UNIT/ML ~~LOC~~ SOLN
10.0000 [IU] | Freq: Every day | SUBCUTANEOUS | Status: DC
  Administered 2016-04-13: 10 [IU] via SUBCUTANEOUS
  Filled 2016-04-12 (×2): qty 0.1

## 2016-04-12 MED ORDER — PROPOFOL 10 MG/ML IV BOLUS
INTRAVENOUS | Status: DC | PRN
  Administered 2016-04-12 (×2): 20 mg via INTRAVENOUS
  Administered 2016-04-12: 50 mg via INTRAVENOUS

## 2016-04-12 MED ORDER — FAMOTIDINE 20 MG PO TABS
20.0000 mg | ORAL_TABLET | Freq: Two times a day (BID) | ORAL | Status: DC
  Administered 2016-04-13: 20 mg via ORAL
  Filled 2016-04-12: qty 1

## 2016-04-12 MED ORDER — BSS IO SOLN
30.0000 mL | Freq: Once | INTRAOCULAR | Status: AC
  Administered 2016-04-12: 30 mL via INTRAOCULAR
  Filled 2016-04-12: qty 30

## 2016-04-12 MED ORDER — TETRACAINE HCL 0.5 % OP SOLN
1.0000 [drp] | Freq: Once | OPHTHALMIC | Status: AC
  Administered 2016-04-12: 2 [drp] via OPHTHALMIC
  Filled 2016-04-12: qty 2

## 2016-04-12 MED ORDER — LIDOCAINE HCL 2 % IJ SOLN
Freq: Once | INTRAMUSCULAR | Status: DC
  Filled 2016-04-12 (×2): qty 10

## 2016-04-12 MED ORDER — THROMBIN 20000 UNITS EX KIT
PACK | CUTANEOUS | Status: AC
  Filled 2016-04-12: qty 1

## 2016-04-12 MED ORDER — ACETAZOLAMIDE ER 500 MG PO CP12: 500.0000 mg | ORAL_CAPSULE | Freq: Two times a day (BID) | ORAL | Status: DC

## 2016-04-12 MED ORDER — HYPROMELLOSE (GONIOSCOPIC) 2.5 % OP SOLN
OPHTHALMIC | Status: DC | PRN
  Administered 2016-04-12: 2 [drp] via OPHTHALMIC

## 2016-04-12 MED ORDER — LIDOCAINE HCL 2 % IJ SOLN
INTRAMUSCULAR | Status: AC
  Filled 2016-04-12: qty 20

## 2016-04-12 MED ORDER — FENTANYL CITRATE (PF) 250 MCG/5ML IJ SOLN
INTRAMUSCULAR | Status: AC
Start: 2016-04-12 — End: 2016-04-12
  Filled 2016-04-12: qty 5

## 2016-04-12 MED ORDER — INSULIN ASPART 100 UNIT/ML ~~LOC~~ SOLN
0.0000 [IU] | SUBCUTANEOUS | Status: DC
  Administered 2016-04-13: 3 [IU] via SUBCUTANEOUS
  Administered 2016-04-13: 2 [IU] via SUBCUTANEOUS
  Administered 2016-04-13: 1 [IU] via SUBCUTANEOUS
  Administered 2016-04-13: 3 [IU] via SUBCUTANEOUS

## 2016-04-12 MED ORDER — LIDOCAINE-EPINEPHRINE (PF) 2 %-1:200000 IJ SOLN
20.0000 mL | Freq: Once | INTRAMUSCULAR | Status: AC
  Administered 2016-04-12: 20 mL
  Filled 2016-04-12: qty 20

## 2016-04-12 MED ORDER — HYPROMELLOSE (GONIOSCOPIC) 2.5 % OP SOLN
OPHTHALMIC | Status: AC
  Filled 2016-04-12: qty 15

## 2016-04-12 MED ORDER — BSS IO SOLN
INTRAOCULAR | Status: DC | PRN
  Administered 2016-04-12: 500 mL via INTRAOCULAR

## 2016-04-12 MED ORDER — NEOMYCIN-POLYMYXIN-DEXAMETH 3.5-10000-0.1 OP OINT
TOPICAL_OINTMENT | OPHTHALMIC | Status: AC
  Filled 2016-04-12: qty 3.5

## 2016-04-12 MED ORDER — BUPIVACAINE HCL (PF) 0.75 % IJ SOLN
INTRAMUSCULAR | Status: AC
  Filled 2016-04-12: qty 10

## 2016-04-12 MED ORDER — PROPOFOL 10 MG/ML IV BOLUS
INTRAVENOUS | Status: AC
  Filled 2016-04-12: qty 20

## 2016-04-12 MED ORDER — ONDANSETRON HCL 4 MG/2ML IJ SOLN
INTRAMUSCULAR | Status: DC | PRN
  Administered 2016-04-12: 4 mg via INTRAVENOUS

## 2016-04-12 SURGICAL SUPPLY — 51 items
ADATO ×2 IMPLANT
APPLICATOR COTTON TIP 6IN STRL (MISCELLANEOUS) ×2 IMPLANT
APPLICATOR DR MATTHEWS STRL (MISCELLANEOUS) ×2 IMPLANT
BLADE MVR KNIFE 19G (BLADE) IMPLANT
BNDG COHESIVE 2X5 WHT NS (GAUZE/BANDAGES/DRESSINGS) IMPLANT
BNDG GAUZE ELAST 4 BULKY (GAUZE/BANDAGES/DRESSINGS) IMPLANT
CANNULA FLEX TIP 25G (CANNULA) ×2 IMPLANT
CORDS BIPOLAR (ELECTRODE) ×2 IMPLANT
DRAPE PROXIMA HALF (DRAPES) ×2 IMPLANT
DRAPE SLUSH MACHINE 52X66 (DRAPES) IMPLANT
DRSG TEGADERM 2-3/8X2-3/4 SM (GAUZE/BANDAGES/DRESSINGS) IMPLANT
GLOVE ECLIPSE 7.5 STRL STRAW (GLOVE) ×2 IMPLANT
GOWN STRL NON-REIN LRG LVL3 (GOWN DISPOSABLE) ×2 IMPLANT
GOWN STRL REUS W/ TWL LRG LVL3 (GOWN DISPOSABLE) ×1 IMPLANT
GOWN STRL REUS W/TWL LRG LVL3 (GOWN DISPOSABLE) ×1
HEMOSTAT SURGICEL 2X14 (HEMOSTASIS) IMPLANT
KIT BASIN OR (CUSTOM PROCEDURE TRAY) ×2 IMPLANT
KIT ROOM TURNOVER OR (KITS) ×2 IMPLANT
NEEDLE 18GX1X1/2 (RX/OR ONLY) (NEEDLE) IMPLANT
NEEDLE HYPO 25GX1X1/2 BEV (NEEDLE) ×2 IMPLANT
NS IRRIG 1000ML POUR BTL (IV SOLUTION) ×2 IMPLANT
PACK CATARACT CUSTOM (CUSTOM PROCEDURE TRAY) IMPLANT
PACK VITRECTOMY CUSTOM (CUSTOM PROCEDURE TRAY) ×2 IMPLANT
PAD ARMBOARD 7.5X6 YLW CONV (MISCELLANEOUS) ×4 IMPLANT
PAK VITRECTOMY PIK 25 GA (OPHTHALMIC RELATED) ×2 IMPLANT
PROTECTOR CORNEAL (OPHTHALMIC RELATED) ×2 IMPLANT
SET INJECTOR OIL FLUID CONSTEL (OPHTHALMIC) ×2 IMPLANT
SPECIMEN JAR SMALL (MISCELLANEOUS) ×2 IMPLANT
SUCTION FRAZIER HANDLE 10FR (MISCELLANEOUS)
SUCTION FRAZIER TIP 8 FR DISP (SUCTIONS) ×1
SUCTION TUBE FRAZIER 10FR DISP (MISCELLANEOUS) IMPLANT
SUCTION TUBE FRAZIER 8FR DISP (SUCTIONS) ×1 IMPLANT
SUT CHROMIC 5 0 P 3 (SUTURE) IMPLANT
SUT ETHILON 10 0 BV75 4 (SUTURE) ×2 IMPLANT
SUT MERSILENE 5 0 RD 1 DA (SUTURE) IMPLANT
SUT PROLENE 6 0 C 1 24 (SUTURE) IMPLANT
SUT SILK 2 0 (SUTURE)
SUT SILK 2 0 FS (SUTURE) ×2 IMPLANT
SUT SILK 2-0 18XBRD TIE 12 (SUTURE) IMPLANT
SUT SILK 4 0 P 3 (SUTURE) IMPLANT
SUT VIC AB 4-0 P-3 18X BRD (SUTURE) IMPLANT
SUT VIC AB 4-0 P3 18 (SUTURE)
SUT VIC AB 5-0 DAS24 8 (SUTURE) IMPLANT
SUT VICRYL 7 0 TG140 8 (SUTURE) IMPLANT
SUT VICRYL 8 0 TG140 8 (SUTURE) ×2 IMPLANT
SUT VICRYL ABS 6-0 S29 18IN (SUTURE) IMPLANT
SYR 20CC LL (SYRINGE) ×2 IMPLANT
SYRINGE 20CC LL (MISCELLANEOUS) ×2 IMPLANT
TOWEL OR 17X24 6PK STRL BLUE (TOWEL DISPOSABLE) IMPLANT
TUBE ANAEROBIC SPECIMEN COL (MISCELLANEOUS) IMPLANT
WATER STERILE IRR 1000ML POUR (IV SOLUTION) ×2 IMPLANT

## 2016-04-12 NOTE — Progress Notes (Signed)
Patient presents to ED with nausea and mild eye pain.  He notes decreased vision.  IOP elevated with tonopen pressure of 47.  Exam revealed good gas fill with corectopia and elevated intraocular pressure.  Long discussion held with patient and family and patient elected to proceed with gas fluid exchange to lower the intraocular pressure.  Informed consent was obtained.  Procedure note:  Subconjunctival 2% lidocaine with epinephrine was used.  After adequate analgesia was attained, topical betadine was administered.  Sterile BSS was introduced while sequentially removing gas from the eye using a push pull technique with a 5cc syringe and 30 gauge needle 4mm posterior to the limbus at the 2:45 position.  The intraocular pressure was 22 at the end of the procedure.  Topical Ofloxacin was ordered to be administered QID.   Long discussion was held with the patient regarding the need for emergent AC washout, air fluid exchange, and placement of silicone oil.  The patient and family understand the risks and benefits of the procedure and elected to proceed with surgery.

## 2016-04-12 NOTE — H&P (Signed)
Date of examination:  04/12/2016  Indication for surgery: Elevated intraocular pressure, inability to position and keep down oral medications and successfully tamponade detached retina  Pertinent past medical history:  Past Medical History  Diagnosis Date  . Peripheral neuropathy (HCC)   . Acute osteomyelitis of metatarsal bone of left foot (HCC) 11/12/2015  . Diabetic peripheral neuropathy associated with type 2 diabetes mellitus (HCC) 08/30/2008    Qualifier: Diagnosis of  By: Daphine DeutscherMartin FNP, Zena AmosNykedtra    . Closed fracture of right tibial plateau 11/11/2015  . Vitamin D deficiency 11/12/2015  . Osteoporosis 11/12/2015  . GERD (gastroesophageal reflux disease)   . Diabetes mellitus     INSULIN DEPENDENT Type II  . Diabetic ulcer of left foot (HCC) 11/14/2015  . Shortness of breath dyspnea     with exertion    Pertinent ocular history:  Eye pain and unsuccessful tamponade of retina  Pertinent family history:  Family History  Problem Relation Age of Onset  . Diabetes Mother   . Cancer Father   . Diabetes Sister   . Diabetes Maternal Uncle   . Diabetes Maternal Grandmother   . Cancer Paternal Grandmother     General:  Healthy appearing patient with nausea   Eyes:    Acuity OS HM    External: Within normal limits    Anterior segment: Corectopia, hyphema  Motility:   normal  Fundus: tractional detachment, partial gas fill    Heart: Regular rate and rhythm without murmur    Lungs: Clear to auscultation     Abdomen: Soft, nontender, normal bowel sounds     Impression: Elevated intraocular pressure, hyphema, inability to position and keep down oral medications and unsuccessful tamponade of detached retina  Plan: Anterior chamber washout, air fluid exchange, silicone oil placement left eye  Harrold DonathNarendra Mafabhai Rashena Dowling

## 2016-04-12 NOTE — Procedures (Deleted)
No note

## 2016-04-12 NOTE — ED Notes (Signed)
[id number] 

## 2016-04-12 NOTE — H&P (Signed)
History and Physical    Travis [name]:[id number] DOB: [birth year] DOA: 04/12/2016  Referring MD/NP/PA: Asher Muir Ward PA-C PCP: [name], FNP Outpatient Specialists: Dr. Allena Katz (optho) Patient coming from: Home  Chief Complaint: Eye problem  HPI: Travis [name] is a 45 y.o. male with medical history significant of DM2, GERD.  Patient underwent retina surgery 4 days ago, was supposed to follow up today with Dr. Allena Katz.  Patient has been having eye pain, N/V multiple times per day since that time.  Because of this hasnt taken insulin or metformin for past 3 days.  Patient complains of eye pain when using drops that were given to him "to decrease his eye pressure" after surgery.  ED Course: Patient was seen by Dr. Allena Katz in the ED who plans on taking patient to OR tonight for a procedure on his eye.  IOP of his left eye was 47 in the ED after bedside procedure by Dr. Allena Katz it has improved to 22.  Review of Systems: As per HPI otherwise 10 point review of systems negative.    Past Medical History  Diagnosis Date  . Peripheral neuropathy (HCC)   . Acute osteomyelitis of metatarsal bone of left foot (HCC) 11/12/2015  . Diabetic peripheral neuropathy associated with type 2 diabetes mellitus (HCC) 08/30/2008    Qualifier: Diagnosis of  By: Daphine Deutscher FNP, Zena Amos    . Closed fracture of right tibial plateau 11/11/2015  . Vitamin D deficiency 11/12/2015  . Osteoporosis 11/12/2015  . GERD (gastroesophageal reflux disease)   . Diabetes mellitus     INSULIN DEPENDENT Type II  . Diabetic ulcer of left foot (HCC) 11/14/2015  . Shortness of breath dyspnea     with exertion    Past Surgical History  Procedure Laterality Date  . Knee surgery Left   . Shoulder surgery Right   . Toe amputation Left 11/28/15    4th toe  . 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 He has never used smokeless tobacco. He reports that he does not drink alcohol or use illicit drugs.  No Known Allergies  Family History  Problem Relation Age of Onset  . Diabetes Mother   . Cancer Father   . Diabetes Sister   . Diabetes Maternal Uncle   . Diabetes Maternal Grandmother   . Cancer Paternal Grandmother      Prior to Admission medications   Medication Sig Start Date End Date Taking? Authorizing Provider  acetaZOLAMIDE (DIAMOX) 500 MG capsule Take 500 mg by mouth 2 (two) times daily. For 20 days 04/08/16  Yes Historical Provider, MD  dorzolamide-timolol (COSOPT) 22.3-6.8 MG/ML ophthalmic solution Place 1 drop into the left eye 2 (two) times daily. 04/09/16  Yes Historical Provider, MD  insulin aspart (NOVOLOG) 100 UNIT/ML FlexPen Inject 0-15 Units into the skin 4 (four) times daily -  with meals and at bedtime. Dose based on blood sugars taken before meals&bedtime. Patient taking differently: Inject 0-10 Units into the skin 4 (four) times daily -  with meals and at bedtime. Dose based on blood sugars taken before meals&bedtime. 03/23/16  Yes [name], FNP  insulin detemir (LEVEMIR) 100 UNIT/ML injection Inject 0.2 mLs (20 Units total) into the skin at bedtime. 03/23/16  Yes [name], FNP  metFORMIN (GLUCOPHAGE) 500 MG tablet Take 1 tablet (  500 mg total) by mouth 2 (two) times daily with a meal. 03/23/16  Yes Massie MaroonLachina M Hollis, FNP  ofloxacin (OCUFLOX) 0.3 % ophthalmic solution Place 1 drop into the left eye 4 (four) times daily. 04/09/16  Yes Historical Provider, MD  prednisoLONE acetate (PRED FORTE) 1 % ophthalmic suspension Place 1 drop into the left eye 4 (four) times daily. 04/09/16  Yes Historical Provider, MD  ranitidine (ZANTAC) 150 MG tablet Take 1 tablet (150 mg total) by mouth 2 (two) times daily. 03/23/16  Yes Massie MaroonLachina M Hollis, FNP    Physical Exam: Filed Vitals:   04/12/16 1500 04/12/16 1530 04/12/16 1600 04/12/16 1830  BP:  155/90 113/80 124/74 120/68  Pulse: 90 79 80 79  Temp:      TempSrc:      Resp: 15  16 16   Height:      Weight:      SpO2: 100% 100% 99% 99%      Constitutional: NAD, calm, comfortable Filed Vitals:   04/12/16 1500 04/12/16 1530 04/12/16 1600 04/12/16 1830  BP: 155/90 113/80 124/74 120/68  Pulse: 90 79 80 79  Temp:      TempSrc:      Resp: 15  16 16   Height:      Weight:      SpO2: 100% 100% 99% 99%   Eyes: PERRL, lids and conjunctivae normal ENMT: Mucous membranes are moist. Posterior pharynx clear of any exudate or lesions.Normal dentition.  Neck: normal, supple, no masses, no thyromegaly Respiratory: clear to auscultation bilaterally, no wheezing, no crackles. Normal respiratory effort. No accessory muscle use.  Cardiovascular: Regular rate and rhythm, no murmurs / rubs / gallops. No extremity edema. 2+ pedal pulses. No carotid bruits.  Abdomen: no tenderness, no masses palpated. No hepatosplenomegaly. Bowel sounds positive.  Musculoskeletal: no clubbing / cyanosis. No joint deformity upper and lower extremities. Good ROM, no contractures. Normal muscle tone.  Skin: no rashes, lesions, ulcers. No induration Neurologic: CN 2-12 grossly intact. Sensation intact, DTR normal. Strength 5/5 in all 4.  Psychiatric: Normal judgment and insight. Alert and oriented x 3. Normal mood.    Labs on Admission: I have personally reviewed following labs and imaging studies  CBC:  Recent Labs Lab 04/12/16 1710  WBC 10.0  NEUTROABS 7.0  HGB 14.9  HCT 43.1  MCV 82.9  PLT 347   Basic Metabolic Panel:  Recent Labs Lab 04/12/16 1710  NA 134*  K 3.5  CL 103  CO2 19*  GLUCOSE 249*  BUN 21*  CREATININE 1.20  CALCIUM 9.5   GFR: Estimated Creatinine Clearance: 91.3 mL/min (by C-G formula based on Cr of 1.2). Liver Function Tests:  Recent Labs Lab 04/12/16 1710  AST 18  ALT 21  ALKPHOS 65  BILITOT 2.4*  PROT 7.8  ALBUMIN 4.4    Recent Labs Lab 04/12/16 1710    LIPASE 26   No results for input(s): AMMONIA in the last 168 hours. Coagulation Profile: No results for input(s): INR, PROTIME in the last 168 hours. Cardiac Enzymes: No results for input(s): CKTOTAL, CKMB, CKMBINDEX, TROPONINI in the last 168 hours. BNP (last 3 results) No results for input(s): PROBNP in the last 8760 hours. HbA1C: No results for input(s): HGBA1C in the last 72 hours. CBG: No results for input(s): GLUCAP in the last 168 hours. Lipid Profile: No results for input(s): CHOL, HDL, LDLCALC, TRIG, CHOLHDL, LDLDIRECT in the last 72 hours. Thyroid Function Tests: No results for input(s): TSH, T4TOTAL, FREET4, T3FREE, THYROIDAB  in the last 72 hours. Anemia Panel: No results for input(s): VITAMINB12, FOLATE, FERRITIN, TIBC, IRON, RETICCTPCT in the last 72 hours. Urine analysis:    Component Value Date/Time   COLORURINE AMBER* 11/12/2015 0143   APPEARANCEUR CLEAR 11/12/2015 0143   LABSPEC 1.015 01/21/2016 1118   PHURINE 5.0 01/21/2016 1118   GLUCOSEU NEGATIVE 01/21/2016 1118   HGBUR NEGATIVE 01/21/2016 1118   HGBUR negative 09/17/2009 0834   BILIRUBINUR NEGATIVE 01/21/2016 1118   KETONESUR NEGATIVE 01/21/2016 1118   PROTEINUR NEGATIVE 01/21/2016 1118   UROBILINOGEN 0.2 01/21/2016 1118   NITRITE NEGATIVE 01/21/2016 1118   LEUKOCYTESUR NEGATIVE 01/21/2016 1118   Sepsis Labs: @LABRCNTIP (procalcitonin:4,lacticidven:4) )No results found for this or any previous visit (from the past 240 hour(s)).   Radiological Exams on Admission: No results found.  EKG: Independently reviewed.  Assessment/Plan Principal Problem:   Increased intraocular pressure Active Problems:   Diabetes type 2, uncontrolled (HCC)   Diabetic peripheral neuropathy associated with type 2 diabetes mellitus (HCC)   Increased IOP -  Patient to OR tonight  NPO  Will leave patient on diamox for now until Dr. Allena Katz instructs otherwise.  This may be contributing to a mild NAG metabolic acidosis  (bicarb of 19), which in turn is contributing to his N/V; however, N/V is controlled at the moment, and feel that his IOP probably takes precedence for now.  NS at 125 cc/hr  DM2 -  Hold metformin  Resuming half home dose of levemir  Sensitive scale SSI q4h   DVT prophylaxis: SCDs Code Status: Full Family Communication: Family at bedside Consults called: Dr. Allena Katz has already seen patient in ED Admission status: Admit to obs.   Hillary Bow DO [facility] Pager [phone] from 7PM-7AM  If 7AM-7PM, please contact the day physician for the patient [email or web address] Password [email or web address]  04/12/2016, 7:50 PM

## 2016-04-12 NOTE — ED Notes (Signed)
Report given to Learta CoddingNancy I RN on [employer]6N

## 2016-04-12 NOTE — Procedures (Signed)
Procedure note:  Subconjunctival 2% lidocaine with epinephrine was used.  After adequate analgesia was attained, topical betadine was administered.  Sterile BSS was introduced while sequentially removing gas from the eye using a push pull technique with a 5cc syringe and 30 gauge needle 4mm posterior to the limbus at the 2:45 position.  The intraocular pressure was 22 at the end of the procedure.  Topical Ofloxacin was ordered to be administered QID.

## 2016-04-12 NOTE — ED Provider Notes (Signed)
CSN: [id number]     Arrival date & time 04/12/16  1428 History   None    Chief Complaint  Patient presents with  . Eye Problem     (Consider location/radiation/quality/duration/timing/severity/associated sxs/prior Treatment) Patient is a 45 y.o. male presenting with eye problem. The history is provided by the patient and medical records. No language interpreter was used.  Eye Problem Associated symptoms: nausea and vomiting   Associated symptoms: no headaches and no photophobia    Katha CabalMichael T Seely is a 45 y.o. male  with a PMH of DM, GERD who presents to the Emergency Department complaining of nausea and vomiting since Friday (3 days ago) c/w prior GERD flares. Patient states he underwent left retinal detachment surgery on Thursday (4 days ago) by Dr. Allena KatzPatel at [facility]. Since that time, he has had n/v multiple times per day and unable to keep down food/fluids. Because of this, he has not taken insulin or metformin over the last 3 days either. Patient denies abdominal pain, chest pain, back pain, fever, shortness of breath. Patient is also complaining of pain when he uses his eye drops which were given to him to "decrease his eye pressure" after his surgery. Pain initially was 8/10 at onset, now 5/10. Patient states baseline condition of right eye, left eye with decreased vision since surgery.  Past Medical History  Diagnosis Date  . Peripheral neuropathy (HCC)   . Acute osteomyelitis of metatarsal bone of left foot (HCC) 11/12/2015  . Diabetic peripheral neuropathy associated with type 2 diabetes mellitus (HCC) 08/30/2008    Qualifier: Diagnosis of  By: Daphine DeutscherMartin FNP, Zena AmosNykedtra    . Closed fracture of right tibial plateau 11/11/2015  . Vitamin D deficiency 11/12/2015  . Osteoporosis 11/12/2015  . GERD (gastroesophageal reflux disease)   . Diabetes mellitus     INSULIN DEPENDENT Type II  . Diabetic ulcer of left foot (HCC) 11/14/2015  . Shortness of breath dyspnea     with exertion    Past Surgical History  Procedure Laterality Date  . Knee surgery Left   . Shoulder surgery Right   . Toe amputation Left 11/28/15    4th toe  . Amputation Left 11/28/2015    Procedure: Left 4th Ray Amputation;  Surgeon: Nadara MustardMarcus Duda V, MD;  [facility];  Service: Orthopedics;  Laterality: Left;   Family History  Problem Relation Age of Onset  . Diabetes Mother   . Cancer Father   . Diabetes Sister   . Diabetes Maternal Uncle   . Diabetes Maternal Grandmother   . Cancer Paternal Grandmother    Social History  Substance Use Topics  . Smoking status: Never Smoker   . Smokeless tobacco: Never Used  . Alcohol Use: No    Review of Systems  Constitutional: Negative for fever and chills.  HENT: Negative for congestion.   Eyes: Positive for pain (Left). Negative for photophobia.  Respiratory: Negative for cough and shortness of breath.   Cardiovascular: Negative for chest pain.  Gastrointestinal: Positive for nausea and vomiting. Negative for abdominal pain.  Genitourinary: Negative for dysuria.  Musculoskeletal: Negative for myalgias and neck pain.  Skin: Negative for color change.  Neurological: Negative for dizziness and headaches.      Allergies  Review of patient's allergies indicates no known allergies.  Home Medications   Prior to Admission medications   Medication Sig Start Date End Date Taking? Authorizing Provider  acetaZOLAMIDE (DIAMOX) 500 MG capsule Take 500 mg by mouth 2 (two) times  daily. For 20 days 04/08/16  Yes Historical Provider, MD  dorzolamide-timolol (COSOPT) 22.3-6.8 MG/ML ophthalmic solution Place 1 drop into the left eye 2 (two) times daily. 04/09/16  Yes Historical Provider, MD  insulin aspart (NOVOLOG) 100 UNIT/ML FlexPen Inject 0-15 Units into the skin 4 (four) times daily -  with meals and at bedtime. Dose based on blood sugars taken before meals&bedtime. Patient taking differently: Inject 0-10 Units into the skin 4 (four) times daily -   with meals and at bedtime. Dose based on blood sugars taken before meals&bedtime. 03/23/16  Yes [name], FNP  insulin detemir (LEVEMIR) 100 UNIT/ML injection Inject 0.2 mLs (20 Units total) into the skin at bedtime. 03/23/16  Yes [name], FNP  metFORMIN (GLUCOPHAGE) 500 MG tablet Take 1 tablet (500 mg total) by mouth 2 (two) times daily with a meal. 03/23/16  Yes [name], FNP  ofloxacin (OCUFLOX) 0.3 % ophthalmic solution Place 1 drop into the left eye 4 (four) times daily. 04/09/16  Yes Historical Provider, MD  prednisoLONE acetate (PRED FORTE) 1 % ophthalmic suspension Place 1 drop into the left eye 4 (four) times daily. 04/09/16  Yes Historical Provider, MD  ranitidine (ZANTAC) 150 MG tablet Take 1 tablet (150 mg total) by mouth 2 (two) times daily. 03/23/16  Yes [name], FNP   BP 120/68 mmHg  Pulse 79  Temp(Src) 98.8 F (37.1 C) (Oral)  Resp 16  Ht 6\' 2"  (1.88 m)  Wt 95.255 kg  BMI 26.95 kg/m2  SpO2 99% Physical Exam  Constitutional: He is oriented to person, place, and time. He appears well-developed and well-nourished. No distress.  HENT:  Head: Normocephalic and atraumatic.  Eyes: EOM are normal. Pupils are unequal.  Bilateral pupils reactive to light. Left pupil not round - tear drop shaped. EOMI.   Cardiovascular: Normal rate, regular rhythm, normal heart sounds and intact distal pulses.  Exam reveals no gallop and no friction rub.   No murmur heard. Pulmonary/Chest: Effort normal and breath sounds normal. No respiratory distress. He has no wheezes. He has no rales. He exhibits no tenderness.  Abdominal: He exhibits no mass. There is no rebound and no guarding.  Soft, NT, ND. Bowel sounds nl x 4.   Musculoskeletal: He exhibits no edema.  Neurological: He is alert and oriented to person, place, and time.  Skin: Skin is warm and dry.  Nursing note and vitals reviewed.   ED Course  Procedures (including critical care time) Labs Review Labs  Reviewed  COMPREHENSIVE METABOLIC PANEL - Abnormal; Notable for the following:    Sodium 134 (*)    CO2 19 (*)    Glucose, Bld 249 (*)    BUN 21 (*)    Total Bilirubin 2.4 (*)    All other components within normal limits  CBC WITH DIFFERENTIAL/PLATELET  LIPASE, BLOOD  URINALYSIS, ROUTINE W REFLEX MICROSCOPIC (NOT AT [facility])    Imaging Review No results found. I have personally reviewed and evaluated these images and lab results as part of my medical decision-making.   EKG Interpretation None      MDM   Final diagnoses:  None   OSIAH HARING is a 45 y.o. male who underwent retinal detachment surgery 4 days ago by Dr. Allena Katz at [facility] who presents to ED for:  1. Nausea/vomiting; PMH diabetes and has not taken medication 2/2 n/v in the last 3 days. Patient states symptoms are consistent with his previous GERD episodes. Offered patient  nausea medication, which he declined. CBC and lipase within defined limits, CMP reviewed-anion gap normal. Bili of 2.4. Benign abdominal exam with no tenderness.  2. Eye pain which occurs after administering eye drops which were given to him after surgery. Intraocular pressure of 47 on the left eye, 17 on the right eye. Decreased peripheral vision of the left eye. I consulted patient's ophthalmologist, Dr. Allena Katz, who came to the emergency department to evaluate patient. Dr. Allena Katz performed a procedure to lower intraocular pressure. IOP down to 22 after procedure. Per Dr. Eliane Decree recommendations, will start on ofloxacin 4 times daily for antibiotic prophylaxis. Patient is to undergo procedure in the OR, hopefully tomorrow morning, and Dr. Allena Katz recommends admission until this is done.  Consulted hospitalist, Dr. Julian Reil, who will admit.   Patient discussed with Dr. Effie Shy who agrees with treatment plan.     [facility] Ward, PA-C 04/12/16 1953  Mancel Bale, MD 04/13/16 [phone]

## 2016-04-12 NOTE — Anesthesia Preprocedure Evaluation (Addendum)
Anesthesia Evaluation  Patient identified by MRN, date of birth, ID band Patient awake    Reviewed: Allergy & Precautions, NPO status , Patient's Chart, lab work & pertinent test results  History of Anesthesia Complications Negative for: history of anesthetic complications  Airway Mallampati: II  TM Distance: >3 FB Neck ROM: Full    Dental  (+) Teeth Intact, Dental Advisory Given   Pulmonary    breath sounds clear to auscultation       Cardiovascular  Rhythm:Regular Rate:Normal     Neuro/Psych  Neuromuscular disease (neuropathy)    GI/Hepatic GERD  ,  Endo/Other  diabetes, Type 1, Insulin Dependent, Oral Hypoglycemic Agents  Renal/GU      Musculoskeletal   Abdominal   Peds  Hematology   Anesthesia Other Findings   Reproductive/Obstetrics                            Anesthesia Physical Anesthesia Plan  ASA: III  Anesthesia Plan: MAC and Regional   Post-op Pain Management:    Induction:   Airway Management Planned: Nasal Cannula  Additional Equipment:   Intra-op Plan:   Post-operative Plan:   Informed Consent: I have reviewed the patients History and Physical, chart, labs and discussed the procedure including the risks, benefits and alternatives for the proposed anesthesia with the patient or authorized representative who has indicated his/her understanding and acceptance.     Plan Discussed with: Anesthesiologist and Surgeon  Anesthesia Plan Comments:         Anesthesia Quick Evaluation

## 2016-04-12 NOTE — ED Notes (Addendum)
Per EMS - pt had retinal surgery for detached retina on left eye on Thursday; however, pt has had eye pain when administering eye drops since surgery. Intermittent n/v. Pt has not taken insulin b/c he is unable to eat. Denies shortness of breath/cp. Hx GERD. Not given zofran b/c pt states zofran does not work for him. Given 400cc fluid.

## 2016-04-13 ENCOUNTER — Encounter (HOSPITAL_COMMUNITY): Payer: Self-pay | Admitting: Ophthalmology

## 2016-04-13 DIAGNOSIS — H40052 Ocular hypertension, left eye: Secondary | ICD-10-CM | POA: Diagnosis not present

## 2016-04-13 DIAGNOSIS — E1142 Type 2 diabetes mellitus with diabetic polyneuropathy: Secondary | ICD-10-CM | POA: Diagnosis not present

## 2016-04-13 DIAGNOSIS — E1165 Type 2 diabetes mellitus with hyperglycemia: Secondary | ICD-10-CM | POA: Diagnosis not present

## 2016-04-13 LAB — GLUCOSE, CAPILLARY
GLUCOSE-CAPILLARY: 190 mg/dL — AB (ref 65–99)
Glucose-Capillary: 175 mg/dL — ABNORMAL HIGH (ref 65–99)
Glucose-Capillary: 182 mg/dL — ABNORMAL HIGH (ref 65–99)
Glucose-Capillary: 231 mg/dL — ABNORMAL HIGH (ref 65–99)
Glucose-Capillary: 200 mg/dL — ABNORMAL HIGH (ref 65–99)
Glucose-Capillary: 226 mg/dL — ABNORMAL HIGH (ref 65–99)

## 2016-04-13 LAB — COMPREHENSIVE METABOLIC PANEL
ALBUMIN: 3.8 g/dL (ref 3.5–5.0)
ALT: 18 U/L (ref 17–63)
AST: 15 U/L (ref 15–41)
Alkaline Phosphatase: 57 U/L (ref 38–126)
Anion gap: 10 (ref 5–15)
BILIRUBIN TOTAL: 2.2 mg/dL — AB (ref 0.3–1.2)
BUN: 24 mg/dL — ABNORMAL HIGH (ref 6–20)
CHLORIDE: 105 mmol/L (ref 101–111)
CO2: 18 mmol/L — ABNORMAL LOW (ref 22–32)
Calcium: 9.1 mg/dL (ref 8.9–10.3)
Creatinine, Ser: 0.92 mg/dL (ref 0.61–1.24)
GFR calc Af Amer: >60 mL/min (ref >60)
GFR calc non Af Amer: >60 mL/min (ref >60)
GLUCOSE: 227 mg/dL — AB (ref 65–99)
POTASSIUM: 3.3 mmol/L — AB (ref 3.5–5.1)
SODIUM: 133 mmol/L — AB (ref 135–145)
TOTAL PROTEIN: 6.8 g/dL (ref 6.5–8.1)

## 2016-04-13 LAB — LACTIC ACID, PLASMA: Lactic Acid, Venous: 1.6 mmol/L (ref 0.5–2.0)

## 2016-04-13 LAB — MAGNESIUM: Magnesium: 1.9 mg/dL (ref 1.7–2.4)

## 2016-04-13 MED ORDER — TETRACAINE HCL 0.5 % OP SOLN
OPHTHALMIC | Status: AC
  Filled 2016-04-13: qty 2

## 2016-04-13 MED ORDER — ACETAZOLAMIDE 250 MG PO TABS
250.0000 mg | ORAL_TABLET | Freq: Two times a day (BID) | ORAL | Status: DC
Start: 2016-04-13 — End: 2016-10-06

## 2016-04-13 MED ORDER — METOCLOPRAMIDE HCL 5 MG/ML IJ SOLN
INTRAMUSCULAR | Status: AC
  Filled 2016-04-13: qty 2

## 2016-04-13 MED ORDER — DEXAMETHASONE SODIUM PHOSPHATE 10 MG/ML IJ SOLN
INTRAMUSCULAR | Status: AC
  Filled 2016-04-13: qty 1

## 2016-04-13 MED ORDER — PANTOPRAZOLE SODIUM 40 MG PO TBEC
40.0000 mg | DELAYED_RELEASE_TABLET | Freq: Two times a day (BID) | ORAL | Status: DC
  Administered 2016-04-13: 40 mg via ORAL
  Filled 2016-04-13: qty 1

## 2016-04-13 MED ORDER — POTASSIUM CHLORIDE CRYS ER 20 MEQ PO TBCR
40.0000 meq | EXTENDED_RELEASE_TABLET | Freq: Once | ORAL | Status: AC
  Administered 2016-04-13: 40 meq via ORAL
  Filled 2016-04-13: qty 2

## 2016-04-13 MED ORDER — ONDANSETRON 4 MG PO TBDP: 4.0000 mg | ORAL_TABLET | Freq: Three times a day (TID) | ORAL | Status: DC | PRN

## 2016-04-13 MED ORDER — ALUM & MAG HYDROXIDE-SIMETH 200-200-20 MG/5ML PO SUSP: 15.0000 mL | ORAL | Status: DC | PRN

## 2016-04-13 MED ORDER — DEXAMETHASONE SODIUM PHOSPHATE 10 MG/ML IJ SOLN
INTRAMUSCULAR | Status: DC | PRN
  Administered 2016-04-13: .5 mL

## 2016-04-13 MED ORDER — ONDANSETRON HCL 4 MG/2ML IJ SOLN: 4.0000 mg | Freq: Four times a day (QID) | INTRAMUSCULAR | Status: DC | PRN

## 2016-04-13 MED ORDER — TOBRAMYCIN 0.3 % OP OINT
TOPICAL_OINTMENT | OPHTHALMIC | Status: DC | PRN
  Administered 2016-04-13: 1 via OPHTHALMIC

## 2016-04-13 MED ORDER — PANTOPRAZOLE SODIUM 40 MG PO TBEC: 40.0000 mg | DELAYED_RELEASE_TABLET | Freq: Two times a day (BID) | ORAL | Status: DC

## 2016-04-13 MED ORDER — METOCLOPRAMIDE HCL 5 MG/ML IJ SOLN
10.0000 mg | Freq: Once | INTRAMUSCULAR | Status: AC
  Administered 2016-04-13: 10 mg via INTRAVENOUS

## 2016-04-13 MED ORDER — TOBRAMYCIN-DEXAMETHASONE 0.3-0.1 % OP OINT
TOPICAL_OINTMENT | OPHTHALMIC | Status: AC
  Filled 2016-04-13: qty 3.5

## 2016-04-13 MED ORDER — OFLOXACIN 0.3 % OP SOLN: 1.0000 [drp] | Freq: Four times a day (QID) | OPHTHALMIC | Status: DC

## 2016-04-13 NOTE — Transfer of Care (Signed)
Immediate Anesthesia Transfer of Care Note  Patient: Travis [name]  Procedure(s) Performed: Procedure(s): ANTERIOR CHAMBER WASH OUT WITH GAS FLUID EXCHANGE (Left)  Patient Location: PACU  Anesthesia Type:MAC  Level of Consciousness: awake, alert  and oriented  Airway & Oxygen Therapy: Patient Spontanous Breathing  Post-op Assessment: Report given to RN and Post -op Vital signs reviewed and stable  Post vital signs: Reviewed and stable  Last Vitals:  Filed Vitals:   04/12/16 2045 04/12/16 2121  BP: 115/60 117/57  Pulse: 86 85  Temp:  37.7 C  Resp:  18    Last Pain:  Filed Vitals:   04/13/16 0033  PainSc: 0-No pain         Complications: No apparent anesthesia complications

## 2016-04-13 NOTE — Progress Notes (Signed)
Patient walked the halls- about 250 feet with walker and assistance. Patient is improving at this time. Will inform MD for possible d/c

## 2016-04-13 NOTE — Anesthesia Postprocedure Evaluation (Signed)
Anesthesia Post Note  Patient: Travis [name]  Procedure(s) Performed: Procedure(s) (LRB): ANTERIOR CHAMBER WASH OUT WITH GAS FLUID EXCHANGE (Left)  Patient location during evaluation: PACU Anesthesia Type: MAC Level of consciousness: awake, awake and alert and oriented Pain management: pain level controlled Vital Signs Assessment: post-procedure vital signs reviewed and stable Respiratory status: spontaneous breathing, nonlabored ventilation and respiratory function stable Cardiovascular status: blood pressure returned to baseline Anesthetic complications: no    Last Vitals:  Filed Vitals:   04/13/16 0054 04/13/16 0117  BP: 115/73 162/81  Pulse: 77 90  Temp: 36.4 C 36.7 C  Resp: 13 16    Last Pain:  Filed Vitals:   04/13/16 0118  PainSc: 0-No pain                 Abegail Kloeppel COKER

## 2016-04-13 NOTE — Progress Notes (Signed)
Spoke with pharmacist to clarify morning order for eyedrops- told to give in left eye.

## 2016-04-13 NOTE — Progress Notes (Signed)
Pt ready for discharge to home. Pt. Is alert and oriented. Pt is hemodynamically stable. IV removed. AVS reviewed with pt. Capable of re verbalizing medication regimen. Discharge plan appropriate and in place. Awaiting ride.

## 2016-04-13 NOTE — Brief Op Note (Signed)
04/12/2016 - 04/13/2016  12:29 AM  PATIENT:  Travis [name]  45 y.o. male  PRE-OPERATIVE DIAGNOSIS:  elevated intraocular pressure, retained gas, hyphema, and inability to position and support detached retina  POST-OPERATIVE DIAGNOSIS:  elevated intraocular pressure, retained gas, hyphema, and inability to position and support detached retina  PROCEDURE:  Procedure(s): ANTERIOR CHAMBER WASH OUT WITH GAS FLUID EXCHANGE AND SILICONE OIL TAMPONADE(Left)  SURGEON:  Surgeon(s) and Role:    * Carmela RimaNarendra Aneira Cavitt, MD - Primary  PHYSICIAN ASSISTANT:   ASSISTANTS: none   ANESTHESIA:   local and MAC  EBL:  Total I/O In: 500 [I.V.:500] Out: 1 [Blood:1]  BLOOD ADMINISTERED:none  DRAINS: none   LOCAL MEDICATIONS USED:  MARCAINE    and LIDOCAINE   SPECIMEN:  No Specimen  DISPOSITION OF SPECIMEN:  N/A  COUNTS:  YES  TOURNIQUET:  * No tourniquets in log *  DICTATION: .Note written in EPIC  PLAN OF CARE: Admit for overnight observation  PATIENT DISPOSITION:  PACU - hemodynamically stable.   Delay start of Pharmacological VTE agent (>24hrs) due to surgical blood loss or risk of bleeding: no

## 2016-04-13 NOTE — Progress Notes (Signed)
Inpatient Diabetes Program Recommendations  AACE/ADA: New Consensus Statement on Inpatient Glycemic Control (2015)  Target Ranges:  Prepandial:   less than 140 mg/dL      Peak postprandial:   less than 180 mg/dL (1-2 hours)      Critically ill patients:  140 - 180 mg/dL   Results for Travis [name], Travis [name] (MRN [id number]) as of 04/13/2016 09:52  Ref. Range 04/12/2016 21:29 04/13/2016 00:31 04/13/2016 05:28 04/13/2016 08:03  Glucose-Capillary Latest Ref Range: 65-99 mg/dL 409182 (H) 811175 (H) 914231 (H) 200 (H)   Review of Glycemic Control  Diabetes history: DM 2 Outpatient Diabetes medications: Levemir 20, Novolog 0-10 units TID, Metformin 500 mg BID Current orders for Inpatient glycemic control: Levemir 10 units, Novolog Sensitive (0-9 units) Q4hrs  Inpatient Diabetes Program Recommendations: Insulin - Basal: Elevated glucose this am. Consider increasing Levemir to 14 units.   Thanks,  Christena DeemShannon Coltin Casher RN, MSN, [facility] Inpatient Diabetes Coordinator Team Pager [phone] (8a-5p)

## 2016-04-13 NOTE — Op Note (Signed)
Travis CabalMichael T [name] 04/13/2016 Diagnosis: hyphema, retained gas, elevated intraocular pressure, and inability to position to support detached retina  Procedure: Fluid Gas Exchange, Silicone Oil and air fluid exchange Operative Eye:  left eye  Surgeon: Travis [name] Estimated Blood Loss: minimal Specimens for Pathology:  None Complications: none    The  patient was prepped and draped in the usual fashion for ocular surgery on the  left eye .  A lid speculum was placed.  Infusion line and trocar was placed at the 3:30 o'clock position approximately 3.5 mm from the surgical limbus.   The infusion line was allowed to run and then clamped when placed at the cannula opening. The line was inserted and secured to the drape with an adhesive strip.   Active trocars/cannula were placed at the 9:30 and 2:30 o'clock positions approximately 3.5 mm from the surgical limbus. The cannula was visualized in the vitreous cavity.  The diamond blade was used and a superior clear corneal wound was made and BSS was used to irrigate and washout the anterior chamber, some remaining heme was noted along the pupillary ruff.  The clear corneal wound was sutured closed with two interrupted 10-0 nylon sutures and buried.  Using the biom, a complete air fluid exchange was performed.  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  Silicone oil was injected into the eye to attain a near complete fill.  The trocars were sequentially removed and sutured closed with 8-0 vicryl suture.  The conjunctiva was reapproximated to the limbus and sutured closed with 8-0 Vicryl.  Subconjunctival injection of decadron was placed,  The eye was cleaned with 5% betadine and tobradex ointment was placed in the eye and the drapes and speculum removed.  The eye was dressed with a sterile patch and shield.  The patient understands the need to follow-up in the clinic the following day.  The patient  tolerated the procedure well and no complications were noted.  Travis DonathNarendra Mafabhai Travis Mcclintic MD

## 2016-04-14 NOTE — Discharge Summary (Signed)
[facility] Discharge Summary   Patient: Travis [name] ZOX:[id number]   PCP: [name], FNP DOB: [birth year]   Date of admission: 04/12/2016   Date of discharge: 04/13/2016     Discharge Diagnoses:  Principal Problem:   Increased intraocular pressure Active Problems:   Diabetes type 2, uncontrolled (HCC)   Diabetic peripheral neuropathy associated with type 2 diabetes mellitus (HCC)  Recommendations for Outpatient Follow-up:  1. Follow up with PCP in 1 week for possible gastroparesis 2. Follow up with ophthalmology in 1 day   Follow-up Information    Follow up with Harrold Donath, MD In 1 day.   Specialty:  Ophthalmology   Why:  For wound re-check   Contact information:   [address] [place] [address] [phone]       Follow up with [name], FNP. Schedule an appointment as soon as possible for a visit in 1 week.   Specialty:  Family Medicine   Contact information:   [address] [place] [address] [phone]      Diet recommendation: carb modified, small frequent meals.  Activity: The patient is advised to gradually reintroduce usual activities.  Discharge Condition: good  History of present illness: As per the H and P dictated on admission, "SHEPHERD FINNAN is a 45 y.o. male with medical history significant of DM2, GERD. Patient underwent retina surgery 4 days ago, was supposed to follow up today with Dr. Allena Katz. Patient has been having eye pain, N/V multiple times per day since that time. Because of this hasnt taken insulin or metformin for past 3 days. Patient complains of eye pain when using drops that were given to him "to decrease his eye pressure" after surgery.  ED Course: Patient was seen by Dr. Allena Katz in the ED who plans on taking patient to OR tonight for a procedure on his eye. IOP of his left eye was 47 in the ED after bedside procedure by Dr. Allena Katz it has improved to 22."  Hospital Course:    Summary of his active problems in the hospital is as following. Principal Problem:   Increased intraocular pressure S/p Fluid Gas Exchange, Silicone Oil and air fluid exchange Pt's headache has resolved. Pt will follow up with opnthalmology in 1 day. New prescription for ofloxacin eye drops given Dose of diamox reduced to 250 mg bid.  Active Problems:   Diabetes type 2, uncontrolled (HCC)   Diabetic peripheral neuropathy associated with type 2 diabetes mellitus (HCC) Blood sugars were well controlled. Pt mentions he has profound neuropathy from diabetes. Pt may benefit from referral to endocrinology for DM management.  Nausea vomiting. GERD metabolic acidosis Pt had significant weakness from recurrent nausea and episodic vomiting. That was his reason for admission. Nausea and vomiting resolved. Likely from acidosis from diamox and dose reduced in half. Also GERD per pt, so placed on 2 weeks course of BID PPI and recommend PCP to monitor response. This can also represent GASTROPARESIS, and if pt does not improve from above management then he may need further work up regarding DM gastroparesis. Orthostatic were negative.  All other chronic medical condition were stable during the hospitalization.  Patient was ambulatory without any assistance. On the day of the discharge the patient's vitals were stable., and no other acute medical condition were reported by patient. the patient was felt safe to be discharge at home with family.  Procedures and Results:  Fluid Gas Exchange, Silicone Oil and air fluid  exchange   Consultations:  ophthalmology  DISCHARGE MEDICATION: Discharge Medication List as of 04/13/2016  5:05 PM    START taking these medications   Details  acetaZOLAMIDE (DIAMOX) 250 MG tablet Take 1 tablet (250 mg total) by mouth 2 (two) times daily., Starting 04/13/2016, Until Discontinued, Normal    alum & mag hydroxide-simeth (MAALOX/MYLANTA) 200-200-20 MG/5ML  suspension Take 15 mLs by mouth every 4 (four) hours as needed for indigestion or heartburn., Starting 04/13/2016, Until Discontinued, Normal    pantoprazole (PROTONIX) 40 MG tablet Take 1 tablet (40 mg total) by mouth 2 (two) times daily before a meal., Starting 04/13/2016, Until Discontinued, Normal      CONTINUE these medications which have CHANGED   Details  ofloxacin (OCUFLOX) 0.3 % ophthalmic solution Place 1 drop into the left eye 4 (four) times daily., Starting 04/13/2016, Until Discontinued, Normal      CONTINUE these medications which have NOT CHANGED   Details  dorzolamide-timolol (COSOPT) 22.3-6.8 MG/ML ophthalmic solution Place 1 drop into the left eye 2 (two) times daily., Starting 04/09/2016, Until Discontinued, Historical Med    insulin aspart (NOVOLOG) 100 UNIT/ML FlexPen Inject 0-15 Units into the skin 4 (four) times daily -  with meals and at bedtime. Dose based on blood sugars taken before meals&bedtime., Starting 03/23/2016, Until Discontinued, Normal    insulin detemir (LEVEMIR) 100 UNIT/ML injection Inject 0.2 mLs (20 Units total) into the skin at bedtime., Starting 03/23/2016, Until Discontinued, Normal    metFORMIN (GLUCOPHAGE) 500 MG tablet Take 1 tablet (500 mg total) by mouth 2 (two) times daily with a meal., Starting 03/23/2016, Until Discontinued, Normal    prednisoLONE acetate (PRED FORTE) 1 % ophthalmic suspension Place 1 drop into the left eye 4 (four) times daily., Starting 04/09/2016, Until Discontinued, Historical Med      STOP taking these medications     acetaZOLAMIDE (DIAMOX) 500 MG capsule      ranitidine (ZANTAC) 150 MG tablet        No Known Allergies Discharge Instructions    Diet - low sodium heart healthy    Complete by:  As directed      Diet Carb Modified    Complete by:  As directed      Increase activity slowly    Complete by:  As directed           Discharge Exam: Filed Weights   04/12/16 1435  Weight: 95.255 kg (210 lb)   Filed  Vitals:   04/13/16 0600 04/13/16 0919  BP: 138/61 144/73  Pulse: 93 83  Temp: 98.1 F (36.7 C) 98.7 F (37.1 C)  Resp: 20 18   General: Appear in mild distress, no Rash; Oral Mucosa moist. Cardiovascular: S1 and S2 Present, no Murmur, no JVD Respiratory: Bilateral Air entry present and Clear to Auscultation, no Crackles, no wheezes Abdomen: Bowel Sound present, Soft and no tenderness Extremities: no Pedal edema, no calf tenderness Neurology: Grossly no focal neuro deficit.  The results of significant diagnostics from this hospitalization (including imaging, microbiology, ancillary and laboratory) are listed below for reference.    Significant Diagnostic Studies: No results found.  Microbiology: No results found for this or any previous visit (from the past 240 hour(s)).   Labs: CBC:  Recent Labs Lab 04/12/16 1710  WBC 10.0  NEUTROABS 7.0  HGB 14.9  HCT 43.1  MCV 82.9  PLT 347   Basic Metabolic Panel:  Recent Labs Lab 04/12/16 1710 04/12/16 2116 04/13/16 1012  NA 134* 133*  133*  K 3.5 3.8 3.3*  CL 103 104 105  CO2 19* 19* 18*  GLUCOSE 249* 212* 227*  BUN 21* 22* 24*  CREATININE 1.20 1.04 0.92  CALCIUM 9.5 9.5 9.1  MG  --   --  1.9   Liver Function Tests:  Recent Labs Lab 04/12/16 1710 04/13/16 1012  AST 18 15  ALT 21 18  ALKPHOS 65 57  BILITOT 2.4* 2.2*  PROT 7.8 6.8  ALBUMIN 4.4 3.8    Recent Labs Lab 04/12/16 1710  LIPASE 26   No results for input(s): AMMONIA in the last 168 hours. Cardiac Enzymes: No results for input(s): CKTOTAL, CKMB, CKMBINDEX, TROPONINI in the last 168 hours. BNP (last 3 results) No results for input(s): BNP in the last 8760 hours. CBG:  Recent Labs Lab 04/13/16 0031 04/13/16 0528 04/13/16 0803 04/13/16 1159 04/13/16 1528  GLUCAP 175* 231* 200* 190* 226*   Time spent: 30 minutes  Signed:  Terrin Imparato  [facility] 04/13/2016 , 12:09 AM

## 2016-04-23 ENCOUNTER — Other Ambulatory Visit: Payer: Self-pay | Admitting: Family Medicine

## 2016-04-23 ENCOUNTER — Encounter: Payer: Self-pay | Admitting: Family Medicine

## 2016-04-23 ENCOUNTER — Ambulatory Visit (INDEPENDENT_AMBULATORY_CARE_PROVIDER_SITE_OTHER): Payer: Medicaid Other | Admitting: Family Medicine

## 2016-04-23 ENCOUNTER — Ambulatory Visit (HOSPITAL_COMMUNITY)
Admission: RE | Admit: 2016-04-23 | Discharge: 2016-04-23 | Disposition: A | Discharge status: Home / Self Care | Payer: Medicaid Other | Source: Ambulatory Visit | Attending: Internal Medicine | Admitting: Internal Medicine

## 2016-04-23 ENCOUNTER — Other Ambulatory Visit: Payer: No Typology Code available for payment source

## 2016-04-23 VITALS — BP 111/73 | HR 87 | Temp 97.6°F | Resp 16 | Ht 74.0 in | Wt 196.0 lb

## 2016-04-23 DIAGNOSIS — K219 Gastro-esophageal reflux disease without esophagitis: Secondary | ICD-10-CM | POA: Diagnosis not present

## 2016-04-23 DIAGNOSIS — R112 Nausea with vomiting, unspecified: Secondary | ICD-10-CM | POA: Diagnosis not present

## 2016-04-23 DIAGNOSIS — E1142 Type 2 diabetes mellitus with diabetic polyneuropathy: Secondary | ICD-10-CM

## 2016-04-23 DIAGNOSIS — E860 Dehydration: Secondary | ICD-10-CM

## 2016-04-23 DIAGNOSIS — E1165 Type 2 diabetes mellitus with hyperglycemia: Secondary | ICD-10-CM

## 2016-04-23 DIAGNOSIS — R1084 Generalized abdominal pain: Secondary | ICD-10-CM | POA: Diagnosis not present

## 2016-04-23 LAB — COMPLETE METABOLIC PANEL WITH GFR
ALT: 14 U/L (ref 9–46)
AST: 14 U/L (ref 10–40)
Albumin: 4.5 g/dL (ref 3.6–5.1)
Alkaline Phosphatase: 50 U/L (ref 40–115)
BILIRUBIN TOTAL: 1.3 mg/dL — AB (ref 0.2–1.2)
BUN: 13 mg/dL (ref 7–25)
CO2: 21 mmol/L (ref 20–31)
CREATININE: 0.98 mg/dL (ref 0.60–1.35)
Calcium: 9.4 mg/dL (ref 8.6–10.3)
Chloride: 102 mmol/L (ref 98–110)
GFR, Est African American: >89 mL/min (ref >=60)
GFR, Est Non African American: >89 mL/min (ref >=60)
GLUCOSE: 220 mg/dL — AB (ref 65–99)
Potassium: 4.1 mmol/L (ref 3.5–5.3)
SODIUM: 136 mmol/L (ref 135–146)
TOTAL PROTEIN: 7.1 g/dL (ref 6.1–8.1)

## 2016-04-23 LAB — CBC WITH DIFFERENTIAL/PLATELET
BASOS PCT: 1 %
Basophils Absolute: 82 cells/uL (ref 0–200)
EOS ABS: 410 {cells}/uL (ref 15–500)
EOS PCT: 5 %
HCT: 41 % (ref 38.5–50.0)
Hemoglobin: 13.7 g/dL (ref 13.2–17.1)
LYMPHS ABS: 2460 {cells}/uL (ref 850–3900)
Lymphocytes Relative: 30 %
MCH: 28.9 pg (ref 27.0–33.0)
MCHC: 33.4 g/dL (ref 32.0–36.0)
MCV: 86.5 fL (ref 80.0–100.0)
MONOS PCT: 5 %
MPV: 9.8 fL (ref 7.5–12.5)
Monocytes Absolute: 410 cells/uL (ref 200–950)
NEUTROS ABS: 4838 {cells}/uL (ref 1500–7800)
Neutrophils Relative %: 59 %
PLATELETS: 372 10*3/uL (ref 140–400)
RBC: 4.74 MIL/uL (ref 4.20–5.80)
RDW: 14.5 % (ref 11.0–15.0)
WBC: 8.2 10*3/uL (ref 3.8–10.8)

## 2016-04-23 LAB — GLUCOSE, CAPILLARY: Glucose-Capillary: 211 mg/dL — ABNORMAL HIGH (ref 65–99)

## 2016-04-23 LAB — LIPASE: Lipase: 17 U/L (ref 7–60)

## 2016-04-23 MED ORDER — SODIUM CHLORIDE 0.9 % IV SOLN
INTRAVENOUS | Status: DC
  Administered 2016-04-23: 12:00:00 via INTRAVENOUS

## 2016-04-23 MED ORDER — ONDANSETRON HCL 4 MG/2ML IJ SOLN
4.0000 mg | Freq: Once | INTRAMUSCULAR | Status: AC
  Administered 2016-04-23: 4 mg via INTRAVENOUS
  Filled 2016-04-23: qty 2

## 2016-04-23 MED ORDER — PROMETHAZINE HCL 12.5 MG PO TABS: 12.5000 mg | ORAL_TABLET | Freq: Three times a day (TID) | ORAL | Status: DC | PRN

## 2016-04-23 NOTE — Progress Notes (Signed)
Medical Provider: L. Hollis NP  Procedure: IV fluid infusion 1000 ml of 0.9% Normal saline and IV Zofran for nausea/vomiting. Patient rested with eyes closed during infusion. After IV dc'd and discharge instructions given to patient. Patient vomited white solid material. When aunt came back to take him home she handed me the phone and the patient's sister was on the phone asking "are you going to send him home vomiting". Explained process of his treatment and recommended that if he is still having the same symptoms after discharge then ED visit is recommended..She said she wanted to be called with the lab results because she said she doesn't feel that he can follow instructions and care for himself at home. She said she had asked for a social worker consult on his last admission and there wasn't one provided and he was sent home without her knowledge. She said she is not his health care power of attorney.  Telford NabL. Hollis notified and she spoke with patient and his aunt.   Tolerated infusion well. Slept during the infusion. Went over discharge instructions and copy given to patient.  Ambulatory to rest room and voided 100 ml for specimen for office. Oriented. No vomiting at time of discharge. Discharged to home vias wheelchair with his aunt driving.

## 2016-04-23 NOTE — Progress Notes (Signed)
Subjective:    Patient ID: Travis [name], male    DOB: Jun 05, 1971, 45 y.o.   MRN: [id number]  Gastroesophageal Reflux He complains of abdominal pain, heartburn and nausea. He reports no belching, no chest pain, no choking, no coughing, no dysphagia, no early satiety, no sore throat, no water brash or no wheezing. This is a recurrent problem. The current episode started in the past 7 days. The problem occurs constantly. The problem has been unchanged. Associated symptoms include fatigue. Pertinent negatives include no anemia, melena, muscle weakness, orthopnea or weight loss. He has tried a diet change and a PPI for the symptoms. The treatment provided significant relief.  Emesis  This is a recurrent problem. The current episode started in the past 7 days. The problem occurs 2 to 4 times per day. The problem has been gradually worsening. The emesis has an appearance of stomach contents. There has been no fever. Associated symptoms include abdominal pain. Pertinent negatives include no chest pain, coughing or weight loss. He has tried nothing for the symptoms. The treatment provided no relief.   Past Medical History  Diagnosis Date  . Peripheral neuropathy (HCC)   . Acute osteomyelitis of metatarsal bone of left foot (HCC) 11/12/2015  . Diabetic peripheral neuropathy associated with type 2 diabetes mellitus (HCC) 08/30/2008    Qualifier: Diagnosis of  By: Daphine Deutscher FNP, Zena Amos    . Closed fracture of right tibial plateau 11/11/2015  . Vitamin D deficiency 11/12/2015  . Osteoporosis 11/12/2015  . GERD (gastroesophageal reflux disease)   . Diabetes mellitus     INSULIN DEPENDENT Type II  . Diabetic ulcer of left foot (HCC) 11/14/2015  . Shortness of breath dyspnea     with exertion   Social History   Social History Narrative   Social History   Social History  . Marital Status: Divorced    Spouse Name: N/A  . Number of Children: N/A  . Years of Education: N/A   Occupational History   . Not on file.   Social History Main Topics  . Smoking status: Never Smoker   . Smokeless tobacco: Never Used  . Alcohol Use: No  . Drug Use: No  . Sexual Activity: Yes   Other Topics Concern  . Not on file   Social History Narrative    Review of Systems  Constitutional: Positive for fatigue. Negative for weight loss.  HENT: Negative for sore throat.   Eyes: Positive for photophobia (Recent eye surgery).  Respiratory: Negative.  Negative for cough, choking and wheezing.   Cardiovascular: Negative for chest pain.  Gastrointestinal: Positive for heartburn, nausea, vomiting and abdominal pain. Negative for dysphagia and melena.  Endocrine: Negative.   Genitourinary: Negative.   Musculoskeletal: Negative.  Negative for muscle weakness.  Skin: Negative.   Allergic/Immunologic: Negative for immunocompromised state.  Neurological: Negative.   Hematological: Negative.   Psychiatric/Behavioral: Negative.        Objective:   Physical Exam  Constitutional: He is oriented to person, place, and time. He appears ill. He appears distressed.  HENT:  Head: Normocephalic and atraumatic.  Right Ear: External ear normal.  Left Ear: External ear normal.  Nose: Nose normal.  Eyes: Conjunctivae and EOM are normal. Pupils are equal, round, and reactive to light.  Neck: Normal range of motion. Neck supple.  Cardiovascular: Normal rate, regular rhythm, normal heart sounds and intact distal pulses.   Pulmonary/Chest: Effort normal and breath sounds normal.  Abdominal: Soft. Bowel sounds are normal. He exhibits  no distension and no ascites. There is generalized tenderness.  Musculoskeletal: Normal range of motion.  Neurological: He is alert and oriented to person, place, and time. He has normal reflexes.  Skin: Skin is warm and dry.  Psychiatric: He has a normal mood and affect. His behavior is normal. Judgment and thought content normal.          BP 111/73 mmHg  Pulse 87  Temp(Src)  97.6 F (36.4 C) (Oral)  Resp 16  Ht 6\' 2"  (1.88 m)  Wt 196 lb (88.905 kg)  BMI 25.15 kg/m2  SpO2 100% Assessment & Plan:  1. Gastroesophageal reflux disease without esophagitis Continue Protonix as previously prescribed. Sent referral to gastroenterologist for further evaluation.   2. Uncontrolled type 2 diabetes mellitus with diabetic polyneuropathy, unspecified long term insulin use status (HCC) - Glucose (CBG) - Hemoglobin A1c  3. Non-intractable vomiting with nausea, unspecified vomiting type Patient has been having symptoms for 1 week. I suspect a viral gastroenteritis, will start a bland diet.  - CBC with Differential - COMPLETE METABOLIC PANEL WITH GFR  - promethazine (PHENERGAN) 12.5 MG tablet; Take 1 tablet (12.5 mg total) by mouth every 8 (eight) hours as needed for nausea or vomiting.  Dispense: 30 tablet; Refill: 0  4. Generalized abdominal pain  - Lipase  5. Dehydration Patient will receive a normal saline bolus in the day infusion center for dehydration.  - POCT urinalysis dipstick   RTC: Will follow up by phone with laboratory results. Will schedule a follow up appt after reviewing lab results   Travis [name] M, FNP   The patient was given clear instructions to go to ER or return to medical center if symptoms do not improve, worsen or new problems develop. The patient verbalized understanding. Will notify patient with laboratory results.

## 2016-04-23 NOTE — Patient Instructions (Signed)
Dehydration, Adult Dehydration is a condition in which you do not have enough fluid or water in your body. It happens when you take in less fluid than you lose. Vital organs such as the kidneys, brain, and heart cannot function without a proper amount of fluids. Any loss of fluids from the body can cause dehydration.  Dehydration can range from mild to severe. This condition should be treated right away to help prevent it from becoming severe. CAUSES  This condition may be caused by:  Vomiting.  Diarrhea.  Excessive sweating, such as when exercising in hot or humid weather.  Not drinking enough fluid during strenuous exercise or during an illness.  Excessive urine output.  Fever.  Certain medicines. RISK FACTORS This condition is more likely to develop in:  People who are taking certain medicines that cause the body to lose excess fluid (diuretics).   People who have a chronic illness, such as diabetes, that may increase urination.  Older adults.   People who live at high altitudes.   People who participate in endurance sports.  SYMPTOMS  Mild Dehydration  Thirst.  Dry lips.  Slightly dry mouth.  Dry, warm skin. Moderate Dehydration  Very dry mouth.   Muscle cramps.   Dark urine and decreased urine production.   Decreased tear production.   Headache.   Light-headedness, especially when you stand up from a sitting position.  Severe Dehydration  Changes in skin.   Cold and clammy skin.   Skin does not spring back quickly when lightly pinched and released.   Changes in body fluids.   Extreme thirst.   No tears.   Not able to sweat when body temperature is high, such as in hot weather.   Minimal urine production.   Changes in vital signs.   Rapid, weak pulse (more than 100 beats per minute when you are sitting still).   Rapid breathing.   Low blood pressure.   Other changes.   Sunken eyes.   Cold hands and feet.    Confusion.  Lethargy and difficulty being awakened.  Fainting (syncope).   Short-term weight loss.   Unconsciousness. DIAGNOSIS  This condition may be diagnosed based on your symptoms. You may also have tests to determine how severe your dehydration is. These tests may include:   Urine tests.   Blood tests.  TREATMENT  Treatment for this condition depends on the severity. Mild or moderate dehydration can often be treated at home. Treatment should be started right away. Do not wait until dehydration becomes severe. Severe dehydration needs to be treated at the hospital. Treatment for Mild Dehydration  Drinking plenty of water to replace the fluid you have lost.   Replacing minerals in your blood (electrolytes) that you may have lost.  Treatment for Moderate Dehydration  Consuming oral rehydration solution (ORS). Treatment for Severe Dehydration  Receiving fluid through an IV tube.   Receiving electrolyte solution through a feeding tube that is passed through your nose and into your stomach (nasogastric tube or NG tube).  Correcting any abnormalities in electrolytes. HOME CARE INSTRUCTIONS   Drink enough fluid to keep your urine clear or pale yellow.   Drink water or fluid slowly by taking small sips. You can also try sucking on ice cubes.  Have food or beverages that contain electrolytes. Examples include bananas and sports drinks.  Take over-the-counter and prescription medicines only as told by your health care provider.   Prepare ORS according to the manufacturer's instructions. Take sips   of ORS every 5 minutes until your urine returns to normal.  If you have vomiting or diarrhea, continue to try to drink water, ORS, or both.   If you have diarrhea, avoid:   Beverages that contain caffeine.   Fruit juice.   Milk.   Carbonated soft drinks.  Do not take salt tablets. This can lead to the condition of having too much sodium in your body  (hypernatremia).  SEEK MEDICAL CARE IF:  You cannot eat or drink without vomiting.  You have had moderate diarrhea during a period of more than 24 hours.  You have a fever. SEEK IMMEDIATE MEDICAL CARE IF:   You have extreme thirst.  You have severe diarrhea.  You have not urinated in 6-8 hours, or you have urinated only a small amount of very dark urine.  You have shriveled skin.  You are dizzy, confused, or both.   This information is not intended to replace advice given to you by your health care provider. Make sure you discuss any questions you have with your health care provider.   Document Released: 11/29/2005 Document Revised: 08/20/2015 Document Reviewed: 04/16/2015 Elsevier Interactive Patient Education 2016 Elsevier Inc.    Nausea and Vomiting Nausea is a sick feeling that often comes before throwing up (vomiting). Vomiting is a reflex where stomach contents come out of your mouth. Vomiting can cause severe loss of body fluids (dehydration). Children and elderly adults can become dehydrated quickly, especially if they also have diarrhea. Nausea and vomiting are symptoms of a condition or disease. It is important to find the cause of your symptoms. CAUSES   Direct irritation of the stomach lining. This irritation can result from increased acid production (gastroesophageal reflux disease), infection, food poisoning, taking certain medicines (such as nonsteroidal anti-inflammatory drugs), alcohol use, or tobacco use.  Signals from the brain.These signals could be caused by a headache, heat exposure, an inner ear disturbance, increased pressure in the brain from injury, infection, a tumor, or a concussion, pain, emotional stimulus, or metabolic problems.  An obstruction in the gastrointestinal tract (bowel obstruction).  Illnesses such as diabetes, hepatitis, gallbladder problems, appendicitis, kidney problems, cancer, sepsis, atypical symptoms of a heart attack, or  eating disorders.  Medical treatments such as chemotherapy and radiation.  Receiving medicine that makes you sleep (general anesthetic) during surgery. DIAGNOSIS Your caregiver may ask for tests to be done if the problems do not improve after a few days. Tests may also be done if symptoms are severe or if the reason for the nausea and vomiting is not clear. Tests may include:  Urine tests.  Blood tests.  Stool tests.  Cultures (to look for evidence of infection).  X-rays or other imaging studies. Test results can help your caregiver make decisions about treatment or the need for additional tests. TREATMENT You need to stay well hydrated. Drink frequently but in small amounts.You may wish to drink water, sports drinks, clear broth, or eat frozen ice pops or gelatin dessert to help stay hydrated.When you eat, eating slowly may help prevent nausea.There are also some antinausea medicines that may help prevent nausea. HOME CARE INSTRUCTIONS   Take all medicine as directed by your caregiver.  If you do not have an appetite, do not force yourself to eat. However, you must continue to drink fluids.  If you have an appetite, eat a normal diet unless your caregiver tells you differently.  Eat a variety of complex carbohydrates (rice, wheat, potatoes, bread), lean meats, yogurt, fruits,   and vegetables.  Avoid high-fat foods because they are more difficult to digest.  Drink enough water and fluids to keep your urine clear or pale yellow.  If you are dehydrated, ask your caregiver for specific rehydration instructions. Signs of dehydration may include:  Severe thirst.  Dry lips and mouth.  Dizziness.  Dark urine.  Decreasing urine frequency and amount.  Confusion.  Rapid breathing or pulse. SEEK IMMEDIATE MEDICAL CARE IF:   You have blood or brown flecks (like coffee grounds) in your vomit.  You have black or bloody stools.  You have a severe headache or stiff  neck.  You are confused.  You have severe abdominal pain.  You have chest pain or trouble breathing.  You do not urinate at least once every 8 hours.  You develop cold or clammy skin.  You continue to vomit for longer than 24 to 48 hours.  You have a fever. MAKE SURE YOU:   Understand these instructions.  Will watch your condition.  Will get help right away if you are not doing well or get worse.   This information is not intended to replace advice given to you by your health care provider. Make sure you discuss any questions you have with your health care provider.   Document Released: 11/29/2005 Document Revised: 02/21/2012 Document Reviewed: 04/28/2011 Elsevier Interactive Patient Education 2016 Elsevier Inc.  

## 2016-04-23 NOTE — Discharge Instructions (Signed)
Infused 1000 ml of 0.9% Normal Saline fluid and received Zofran IV for nausea

## 2016-04-23 NOTE — Progress Notes (Signed)
Complaining of nausea. No vomiting noted. Telford NabL. Hollis NP notified and Zofran administered IV as ordered. Patient resting in bed with eyes closed.

## 2016-04-24 LAB — HEMOGLOBIN A1C
Hgb A1c MFr Bld: 8.1 % — ABNORMAL HIGH (ref <5.7)
Mean Plasma Glucose: 186 mg/dL

## 2016-04-26 ENCOUNTER — Telehealth: Payer: Self-pay

## 2016-04-26 NOTE — Telephone Encounter (Signed)
-----   Message from Massie MaroonLachina M Hollis, [place] sent at 04/26/2016  7:24 AM EDT ----- Regarding: lab results Please inquire about patient's current condition. Will hold insulin until he can tolerate solid foods. Continue bland diet. Schedule follow up appt in 2 weeks, if sx persist  Thanks ----- Message -----    From: Lab in [facility]    Sent: 04/24/2016   1:32 AM      To: Massie MaroonLachina M Hollis, FNP

## 2016-04-26 NOTE — Telephone Encounter (Signed)
Tried to call, no answer, left message for patient to return call. Thanks!  

## 2016-04-29 ENCOUNTER — Telehealth: Payer: Self-pay | Admitting: Family Medicine

## 2016-04-29 NOTE — Telephone Encounter (Signed)
Received a call from Travis [name], Travis [name]'s sister stating that patient condition has worsened.She states that he is continuing to have nausea, vomiting, and a poor appetite.  I recommended that she take patient to the emergency room for further treatment and evaluation. She is very angry and upset that patient was not admitted 1 week ago.   I agreed to evaluate [name] in the office on 04/30/2016 at 1:15.    Travis [name],Travis Mincy M, FNP

## 2016-04-30 ENCOUNTER — Ambulatory Visit (INDEPENDENT_AMBULATORY_CARE_PROVIDER_SITE_OTHER): Payer: Medicaid Other | Admitting: Family Medicine

## 2016-04-30 ENCOUNTER — Encounter: Payer: Self-pay | Admitting: Family Medicine

## 2016-04-30 ENCOUNTER — Ambulatory Visit: Payer: Medicaid Other | Admitting: Family Medicine

## 2016-04-30 VITALS — BP 120/88 | HR 90 | Temp 97.7°F | Resp 16 | Ht 74.0 in | Wt 190.0 lb

## 2016-04-30 DIAGNOSIS — R809 Proteinuria, unspecified: Secondary | ICD-10-CM | POA: Diagnosis not present

## 2016-04-30 DIAGNOSIS — R112 Nausea with vomiting, unspecified: Secondary | ICD-10-CM | POA: Diagnosis not present

## 2016-04-30 DIAGNOSIS — K219 Gastro-esophageal reflux disease without esophagitis: Secondary | ICD-10-CM

## 2016-04-30 DIAGNOSIS — E860 Dehydration: Secondary | ICD-10-CM | POA: Diagnosis not present

## 2016-04-30 LAB — POCT URINALYSIS DIP (DEVICE)
GLUCOSE, UA: 100 mg/dL — AB
HGB URINE DIPSTICK: NEGATIVE
LEUKOCYTES UA: NEGATIVE
NITRITE: NEGATIVE
Protein, ur: 100 mg/dL — AB
Specific Gravity, Urine: 1.025 (ref 1.005–1.030)
UROBILINOGEN UA: 1 mg/dL (ref 0.0–1.0)
pH: 6 (ref 5.0–8.0)

## 2016-04-30 LAB — CBC WITH DIFFERENTIAL/PLATELET
Basophils Absolute: 88 cells/uL (ref 0–200)
Basophils Relative: 1 %
EOS PCT: 3 %
Eosinophils Absolute: 264 cells/uL (ref 15–500)
HCT: 40.3 % (ref 38.5–50.0)
Hemoglobin: 14 g/dL (ref 13.2–17.1)
LYMPHS PCT: 29 %
Lymphs Abs: 2552 cells/uL (ref 850–3900)
MCH: 29.8 pg (ref 27.0–33.0)
MCHC: 34.7 g/dL (ref 32.0–36.0)
MCV: 85.7 fL (ref 80.0–100.0)
MONOS PCT: 6 %
MPV: 10.1 fL (ref 7.5–12.5)
Monocytes Absolute: 528 cells/uL (ref 200–950)
Neutro Abs: 5368 cells/uL (ref 1500–7800)
Neutrophils Relative %: 61 %
Platelets: 344 10*3/uL (ref 140–400)
RBC: 4.7 MIL/uL (ref 4.20–5.80)
RDW: 14.1 % (ref 11.0–15.0)
WBC: 8.8 10*3/uL (ref 3.8–10.8)

## 2016-04-30 LAB — BASIC METABOLIC PANEL WITH GFR
BUN: 12 mg/dL (ref 7–25)
CO2: 25 mmol/L (ref 20–31)
CREATININE: 0.81 mg/dL (ref 0.60–1.35)
Calcium: 9.7 mg/dL (ref 8.6–10.3)
Chloride: 96 mmol/L — ABNORMAL LOW (ref 98–110)
GFR, Est Non African American: >89 mL/min (ref >=60)
Glucose, Bld: 264 mg/dL — ABNORMAL HIGH (ref 65–99)
Potassium: 3.6 mmol/L (ref 3.5–5.3)
Sodium: 133 mmol/L — ABNORMAL LOW (ref 135–146)

## 2016-04-30 MED ORDER — RANITIDINE HCL 300 MG PO TABS: 300.0000 mg | ORAL_TABLET | Freq: Every day | ORAL | Status: DC

## 2016-04-30 NOTE — Patient Instructions (Addendum)
If any problems occur, please call our office to schedule a follow up appointment.    Food Choices for Gastroesophageal Reflux Disease, Adult When you have gastroesophageal reflux disease (GERD), the foods you eat and your eating habits are very important. Choosing the right foods can help ease the discomfort of GERD. WHAT GENERAL GUIDELINES DO I NEED TO FOLLOW?  Choose fruits, vegetables, whole grains, low-fat dairy products, and low-fat meat, fish, and poultry.  Limit fats such as oils, salad dressings, butter, nuts, and avocado.  Keep a food diary to identify foods that cause symptoms.  Avoid foods that cause reflux. These may be different for different people.  Eat frequent small meals instead of three large meals each day.  Eat your meals slowly, in a relaxed setting.  Limit fried foods.  Cook foods using methods other than frying.  Avoid drinking alcohol.  Avoid drinking large amounts of liquids with your meals.  Avoid bending over or lying down until 2-3 hours after eating. WHAT FOODS ARE NOT RECOMMENDED? The following are some foods and drinks that may worsen your symptoms: Vegetables Tomatoes. Tomato juice. Tomato and spaghetti sauce. Chili peppers. Onion and garlic. Horseradish. Fruits Oranges, grapefruit, and lemon (fruit and juice). Meats High-fat meats, fish, and poultry. This includes hot dogs, ribs, ham, sausage, salami, and bacon. Dairy Whole milk and chocolate milk. Sour cream. Cream. Butter. Ice cream. Cream cheese.  Beverages Coffee and tea, with or without caffeine. Carbonated beverages or energy drinks. Condiments Hot sauce. Barbecue sauce.  Sweets/Desserts Chocolate and cocoa. Donuts. Peppermint and spearmint. Fats and Oils High-fat foods, including [place] fries and potato chips. Other Vinegar. Strong spices, such as black pepper, white pepper, red pepper, cayenne, curry powder, cloves, ginger, and chili powder. The items listed above may not be a  complete list of foods and beverages to avoid. Contact your dietitian for more information.   This information is not intended to replace advice given to you by your health care provider. Make sure you discuss any questions you have with your health care provider.   Document Released: 11/29/2005 Document Revised: 12/20/2014 Document Reviewed: 10/03/2013 Elsevier Interactive Patient Education 2016 Elsevier Inc. Gastroesophageal Reflux Disease, Adult Normally, food travels down the esophagus and stays in the stomach to be digested. However, when a person has gastroesophageal reflux disease (GERD), food and stomach acid move back up into the esophagus. When this happens, the esophagus becomes sore and inflamed. Over time, GERD can create small holes (ulcers) in the lining of the esophagus.  CAUSES This condition is caused by a problem with the muscle between the esophagus and the stomach (lower esophageal sphincter, or LES). Normally, the LES muscle closes after food passes through the esophagus to the stomach. When the LES is weakened or abnormal, it does not close properly, and that allows food and stomach acid to go back up into the esophagus. The LES can be weakened by certain dietary substances, medicines, and medical conditions, including:  Tobacco use.  Pregnancy.  Having a hiatal hernia.  Heavy alcohol use.  Certain foods and beverages, such as coffee, chocolate, onions, and peppermint. RISK FACTORS This condition is more likely to develop in:  People who have an increased body weight.  People who have connective tissue disorders.  People who use NSAID medicines. SYMPTOMS Symptoms of this condition include:  Heartburn.  Difficult or painful swallowing.  The feeling of having a lump in the throat.  Abitter taste in the mouth.  Bad breath.  Having a  large amount of saliva.  Having an upset or bloated stomach.  Belching.  Chest pain.  Shortness of breath or  wheezing.  Ongoing (chronic) cough or a night-time cough.  Wearing away of tooth enamel.  Weight loss. Different conditions can cause chest pain. Make sure to see your health care provider if you experience chest pain. DIAGNOSIS Your health care provider will take a medical history and perform a physical exam. To determine if you have mild or severe GERD, your health care provider may also monitor how you respond to treatment. You may also have other tests, including:  An endoscopy toexamine your stomach and esophagus with a small camera.  A test thatmeasures the acidity level in your esophagus.  A test thatmeasures how much pressure is on your esophagus.  A barium swallow or modified barium swallow to show the shape, size, and functioning of your esophagus. TREATMENT The goal of treatment is to help relieve your symptoms and to prevent complications. Treatment for this condition may vary depending on how severe your symptoms are. Your health care provider may recommend:  Changes to your diet.  Medicine.  Surgery. HOME CARE INSTRUCTIONS Diet  Follow a diet as recommended by your health care provider. This may involve avoiding foods and drinks such as:  Coffee and tea (with or without caffeine).  Drinks that containalcohol.  Energy drinks and sports drinks.  Carbonated drinks or sodas.  Chocolate and cocoa.  Peppermint and mint flavorings.  Garlic and onions.  Horseradish.  Spicy and acidic foods, including peppers, chili powder, curry powder, vinegar, hot sauces, and barbecue sauce.  Citrus fruit juices and citrus fruits, such as oranges, lemons, and limes.  Tomato-based foods, such as red sauce, chili, salsa, and pizza with red sauce.  Fried and fatty foods, such as donuts, french fries, potato chips, and high-fat dressings.  High-fat meats, such as hot dogs and fatty cuts of red and white meats, such as rib eye steak, sausage, ham, and bacon.  High-fat  dairy items, such as whole milk, butter, and cream cheese.  Eat small, frequent meals instead of large meals.  Avoid drinking large amounts of liquid with your meals.  Avoid eating meals during the 2-3 hours before bedtime.  Avoid lying down right after you eat.  Do not exercise right after you eat. General Instructions  Pay attention to any changes in your symptoms.  Take over-the-counter and prescription medicines only as told by your health care provider. Do not take aspirin, ibuprofen, or other NSAIDs unless your health care provider told you to do so.  Do not use any tobacco products, including cigarettes, chewing tobacco, and e-cigarettes. If you need help quitting, ask your health care provider.  Wear loose-fitting clothing. Do not wear anything tight around your waist that causes pressure on your abdomen.  Raise (elevate) the head of your bed 6 inches (15cm).  Try to reduce your stress, such as with yoga or meditation. If you need help reducing stress, ask your health care provider.  If you are overweight, reduce your weight to an amount that is healthy for you. Ask your health care provider for guidance about a safe weight loss goal.  Keep all follow-up visits as told by your health care provider. This is important. SEEK MEDICAL CARE IF:  You have new symptoms.  You have unexplained weight loss.  You have difficulty swallowing, or it hurts to swallow.  You have wheezing or a persistent cough.  Your symptoms do not improve  with treatment.  You have a hoarse voice. SEEK IMMEDIATE MEDICAL CARE IF:  You have pain in your arms, neck, jaw, teeth, or back.  You feel sweaty, dizzy, or light-headed.  You have chest pain or shortness of breath.  You vomit and your vomit looks like blood or coffee grounds.  You faint.  Your stool is bloody or black.  You cannot swallow, drink, or eat.   This information is not intended to replace advice given to you by your  health care provider. Make sure you discuss any questions you have with your health care provider.   Document Released: 09/08/2005 Document Revised: 08/20/2015 Document Reviewed: 03/26/2015 Elsevier Interactive Patient Education [employer]2016 Elsevier Inc.

## 2016-04-30 NOTE — Progress Notes (Signed)
Subjective:    Patient ID: Travis [name], male    DOB: 1971-04-17, 45 y.o.   MRN: [id number]  Gastroesophageal Reflux He complains of heartburn. He reports no belching, no chest pain, no choking, no coughing, no dysphagia, no early satiety, no sore throat, no water brash or no wheezing. This is a recurrent problem. The current episode started in the past 7 days. The problem occurs constantly. The problem has been unchanged. Pertinent negatives include no anemia, melena, muscle weakness, orthopnea or weight loss. He has tried a diet change and a PPI for the symptoms. The treatment provided significant relief.  Emesis  This is a recurrent problem. The current episode started in the past 7 days. The problem occurs 2 to 4 times per day. The problem has been gradually worsening. The emesis has an appearance of stomach contents. There has been no fever. Pertinent negatives include no chest pain, coughing, fever or weight loss. He has tried nothing for the symptoms. The treatment provided no relief.   Past Medical History  Diagnosis Date  . Peripheral neuropathy (HCC)   . Acute osteomyelitis of metatarsal bone of left foot (HCC) 11/12/2015  . Diabetic peripheral neuropathy associated with type 2 diabetes mellitus (HCC) 08/30/2008    Qualifier: Diagnosis of  By: Daphine Deutscher FNP, Zena Amos    . Closed fracture of right tibial plateau 11/11/2015  . Vitamin D deficiency 11/12/2015  . Osteoporosis 11/12/2015  . GERD (gastroesophageal reflux disease)   . Diabetes mellitus     INSULIN DEPENDENT Type II  . Diabetic ulcer of left foot (HCC) 11/14/2015  . Shortness of breath dyspnea     with exertion   Social History   Social History Narrative   Social History   Social History  . Marital Status: Divorced    Spouse Name: N/A  . Number of Children: N/A  . Years of Education: N/A   Occupational History  . Not on file.   Social History Main Topics  . Smoking status: Never Smoker   . Smokeless  tobacco: Never Used  . Alcohol Use: No  . Drug Use: No  . Sexual Activity: Yes   Other Topics Concern  . Not on file   Social History Narrative    Review of Systems  Constitutional: Negative for fever and weight loss.  HENT: Negative for sore throat.   Eyes: Positive for photophobia (Recent eye surgery).  Respiratory: Negative.  Negative for cough, choking and wheezing.   Cardiovascular: Negative for chest pain.  Gastrointestinal: Positive for heartburn and vomiting. Negative for dysphagia and melena.       Vomiting has improved since 1 week ago  Endocrine: Negative.   Genitourinary: Negative.   Musculoskeletal: Negative.  Negative for muscle weakness.  Skin: Negative.   Allergic/Immunologic: Negative for immunocompromised state.  Neurological: Negative.   Hematological: Negative.   Psychiatric/Behavioral: Negative.        Objective:   Physical Exam  Constitutional: He is oriented to person, place, and time. He appears ill. He appears distressed.  HENT:  Head: Normocephalic and atraumatic.  Right Ear: External ear normal.  Left Ear: External ear normal.  Nose: Nose normal.  Eyes: Conjunctivae and EOM are normal. Pupils are equal, round, and reactive to light.  Neck: Normal range of motion. Neck supple.  Cardiovascular: Normal rate, regular rhythm, normal heart sounds and intact distal pulses.   Pulmonary/Chest: Effort normal and breath sounds normal.  Abdominal: Soft. Bowel sounds are normal. He exhibits no distension and  no ascites. There is generalized tenderness.  Musculoskeletal: Normal range of motion.  Neurological: He is alert and oriented to person, place, and time. He has normal reflexes.  Skin: Skin is warm and dry.  Psychiatric: He has a normal mood and affect. His behavior is normal. Judgment and thought content normal.          BP 120/88 mmHg  Pulse 90  Temp(Src) 97.7 F (36.5 C) (Oral)  Resp 16  Ht 6\' 2"  (1.88 m)  Wt 190 lb (86.183 kg)  BMI  24.38 kg/m2  SpO2 100% Assessment & Plan:  1. Gastroesophageal reflux disease without esophagitis - ranitidine (ZANTAC) 300 MG tablet; Take 1 tablet (300 mg total) by mouth at bedtime.  Dispense: 30 tablet; Refill: 2  2. Non-intractable vomiting with nausea, vomiting of unspecified type - Urinalysis Dipstick - BASIC METABOLIC PANEL WITH GFR - CBC with Differential  3. Proteinuria - Microalbumin/Creatinine Ratio, Urine   RTC: Will follow up by phone with laboratory results. Will follow up in 3 weeks    Dezarae Mcclaran M, FNP   The patient was given clear instructions to go to ER or return to medical center if symptoms do not improve, worsen or new problems develop. The patient verbalized understanding. Will notify patient with laboratory results.

## 2016-05-01 LAB — MICROALBUMIN / CREATININE URINE RATIO
Creatinine, Urine: 219 mg/dL (ref 20–370)
Microalb Creat Ratio: 100 ug/mg{creat} — ABNORMAL HIGH
Microalb, Ur: 21.8 mg/dL

## 2016-05-03 ENCOUNTER — Telehealth: Payer: Self-pay

## 2016-05-03 NOTE — Telephone Encounter (Signed)
-----   Message from Massie MaroonLachina M Hollis, [place] sent at 05/01/2016  9:22 AM EDT ----- Regarding: lab results Please inform [name] that sodium level is mildly decreased, please continue pedialyte or sugar free gatorade to replenish electrolytes. Also, restart anti-diabetic medications when tolerating solid foods consistently. Please follow up in office as scheduled.   Thanks ----- Message -----    From: Lab in [facility]    Sent: 04/30/2016   9:50 PM      To: Massie MaroonLachina M Hollis, FNP

## 2016-05-03 NOTE — Telephone Encounter (Signed)
Called and left message advising of decreased sodium and to continue to do pedialyte or sugar free gatorade to replenish electrolytes. Advised to start diabetic medications when he is able to tolerate solid food and to keep next scheduled appointment. Asked if any questions to call back to our office and office phone was provided. Thanks!

## 2016-05-06 ENCOUNTER — Other Ambulatory Visit: Payer: Self-pay | Admitting: Family Medicine

## 2016-05-06 ENCOUNTER — Encounter: Payer: Self-pay | Admitting: Family Medicine

## 2016-05-11 ENCOUNTER — Telehealth: Payer: Self-pay

## 2016-05-11 DIAGNOSIS — Z794 Long term (current) use of insulin: Secondary | ICD-10-CM

## 2016-05-11 DIAGNOSIS — E1142 Type 2 diabetes mellitus with diabetic polyneuropathy: Secondary | ICD-10-CM

## 2016-05-11 MED ORDER — INSULIN ASPART 100 UNIT/ML FLEXPEN: 0.0000 [IU] | PEN_INJECTOR | Freq: Three times a day (TID) | SUBCUTANEOUS | Status: DC

## 2016-05-11 MED ORDER — METFORMIN HCL 500 MG PO TABS: 500.0000 mg | ORAL_TABLET | Freq: Two times a day (BID) | ORAL | Status: DC

## 2016-05-11 MED ORDER — INSULIN DETEMIR 100 UNIT/ML ~~LOC~~ SOLN: 20.0000 [IU] | Freq: Every day | SUBCUTANEOUS | Status: DC

## 2016-05-11 NOTE — Telephone Encounter (Signed)
Pt is requesting a medication refill for Metformin, Levamir, and Novolog. He would like them sent to [facility]/[place]. Thanks!

## 2016-05-11 NOTE — Telephone Encounter (Signed)
Refills sent into [facility]. Thanks!

## 2016-05-13 ENCOUNTER — Telehealth: Payer: Self-pay

## 2016-05-13 NOTE — Telephone Encounter (Signed)
Patient has an appointment to See Travis [name] at [facility] on 05/21/2016 @ 2:30pm. Thanks!

## 2016-05-20 ENCOUNTER — Inpatient Hospital Stay (HOSPITAL_COMMUNITY)
Admission: EM | Admit: 2016-05-20 | Discharge: 2016-05-23 | DRG: 638 | Disposition: A | Discharge status: Home / Self Care | Payer: Medicaid Other | Attending: Internal Medicine | Admitting: Internal Medicine

## 2016-05-20 ENCOUNTER — Encounter (HOSPITAL_COMMUNITY): Payer: Self-pay | Admitting: *Deleted

## 2016-05-20 DIAGNOSIS — Z89422 Acquired absence of other left toe(s): Secondary | ICD-10-CM

## 2016-05-20 DIAGNOSIS — Z794 Long term (current) use of insulin: Secondary | ICD-10-CM

## 2016-05-20 DIAGNOSIS — Z7984 Long term (current) use of oral hypoglycemic drugs: Secondary | ICD-10-CM

## 2016-05-20 DIAGNOSIS — K3184 Gastroparesis: Secondary | ICD-10-CM | POA: Diagnosis present

## 2016-05-20 DIAGNOSIS — R112 Nausea with vomiting, unspecified: Secondary | ICD-10-CM | POA: Diagnosis present

## 2016-05-20 DIAGNOSIS — M810 Age-related osteoporosis without current pathological fracture: Secondary | ICD-10-CM | POA: Diagnosis present

## 2016-05-20 DIAGNOSIS — E559 Vitamin D deficiency, unspecified: Secondary | ICD-10-CM | POA: Diagnosis present

## 2016-05-20 DIAGNOSIS — E1310 Other specified diabetes mellitus with ketoacidosis without coma: Principal | ICD-10-CM | POA: Diagnosis present

## 2016-05-20 DIAGNOSIS — R197 Diarrhea, unspecified: Secondary | ICD-10-CM | POA: Diagnosis present

## 2016-05-20 DIAGNOSIS — E876 Hypokalemia: Secondary | ICD-10-CM | POA: Diagnosis present

## 2016-05-20 DIAGNOSIS — Z79899 Other long term (current) drug therapy: Secondary | ICD-10-CM

## 2016-05-20 DIAGNOSIS — K219 Gastro-esophageal reflux disease without esophagitis: Secondary | ICD-10-CM | POA: Diagnosis present

## 2016-05-20 DIAGNOSIS — E440 Moderate protein-calorie malnutrition: Secondary | ICD-10-CM | POA: Insufficient documentation

## 2016-05-20 DIAGNOSIS — E1110 Type 2 diabetes mellitus with ketoacidosis without coma: Secondary | ICD-10-CM | POA: Diagnosis present

## 2016-05-20 DIAGNOSIS — E8729 Other acidosis: Secondary | ICD-10-CM | POA: Diagnosis present

## 2016-05-20 DIAGNOSIS — I951 Orthostatic hypotension: Secondary | ICD-10-CM | POA: Diagnosis present

## 2016-05-20 DIAGNOSIS — Z6823 Body mass index (BMI) 23.0-23.9, adult: Secondary | ICD-10-CM

## 2016-05-20 DIAGNOSIS — Z809 Family history of malignant neoplasm, unspecified: Secondary | ICD-10-CM

## 2016-05-20 DIAGNOSIS — E1143 Type 2 diabetes mellitus with diabetic autonomic (poly)neuropathy: Secondary | ICD-10-CM | POA: Diagnosis present

## 2016-05-20 DIAGNOSIS — E1142 Type 2 diabetes mellitus with diabetic polyneuropathy: Secondary | ICD-10-CM | POA: Diagnosis present

## 2016-05-20 DIAGNOSIS — R1110 Vomiting, unspecified: Secondary | ICD-10-CM

## 2016-05-20 DIAGNOSIS — E872 Acidosis: Secondary | ICD-10-CM | POA: Diagnosis present

## 2016-05-20 DIAGNOSIS — Z833 Family history of diabetes mellitus: Secondary | ICD-10-CM

## 2016-05-20 LAB — CBC WITH DIFFERENTIAL/PLATELET
BASOS ABS: 0 10*3/uL (ref 0.0–0.1)
BASOS PCT: 0 %
EOS ABS: 0 10*3/uL (ref 0.0–0.7)
EOS PCT: 0 %
HEMATOCRIT: 36.1 % — AB (ref 39.0–52.0)
HEMOGLOBIN: 13.3 g/dL (ref 13.0–17.0)
Lymphocytes Relative: 20 %
Lymphs Abs: 2.5 10*3/uL (ref 0.7–4.0)
MCH: 29.6 pg (ref 26.0–34.0)
MCHC: 36.8 g/dL — ABNORMAL HIGH (ref 30.0–36.0)
MCV: 80.2 fL (ref 78.0–100.0)
MONO ABS: 1 10*3/uL (ref 0.1–1.0)
MONOS PCT: 8 %
NEUTROS ABS: 9 10*3/uL — AB (ref 1.7–7.7)
Neutrophils Relative %: 72 %
Platelets: 359 10*3/uL (ref 150–400)
RBC: 4.5 MIL/uL (ref 4.22–5.81)
RDW: 13.5 % (ref 11.5–15.5)
WBC: 12.6 10*3/uL — ABNORMAL HIGH (ref 4.0–10.5)

## 2016-05-20 LAB — COMPREHENSIVE METABOLIC PANEL
ALK PHOS: 50 U/L (ref 38–126)
ALT: 22 U/L (ref 17–63)
AST: 19 U/L (ref 15–41)
Albumin: 4.7 g/dL (ref 3.5–5.0)
Anion gap: 16 — ABNORMAL HIGH (ref 5–15)
BUN: 19 mg/dL (ref 6–20)
CALCIUM: 9.4 mg/dL (ref 8.9–10.3)
CHLORIDE: 99 mmol/L — AB (ref 101–111)
CO2: 16 mmol/L — AB (ref 22–32)
CREATININE: 0.83 mg/dL (ref 0.61–1.24)
Glucose, Bld: 234 mg/dL — ABNORMAL HIGH (ref 65–99)
Potassium: 3.2 mmol/L — ABNORMAL LOW (ref 3.5–5.1)
Sodium: 131 mmol/L — ABNORMAL LOW (ref 135–145)
Total Bilirubin: 3.3 mg/dL — ABNORMAL HIGH (ref 0.3–1.2)
Total Protein: 7.6 g/dL (ref 6.5–8.1)

## 2016-05-20 MED ORDER — SODIUM CHLORIDE 0.9 % IV BOLUS (SEPSIS)
1000.0000 mL | Freq: Once | INTRAVENOUS | Status: AC
  Administered 2016-05-20: 1000 mL via INTRAVENOUS

## 2016-05-20 MED ORDER — DIPHENHYDRAMINE HCL 50 MG/ML IJ SOLN
25.0000 mg | Freq: Once | INTRAMUSCULAR | Status: AC
  Administered 2016-05-20: 25 mg via INTRAVENOUS
  Filled 2016-05-20: qty 1

## 2016-05-20 MED ORDER — METOCLOPRAMIDE HCL 5 MG/ML IJ SOLN
10.0000 mg | Freq: Once | INTRAMUSCULAR | Status: AC
  Administered 2016-05-20: 10 mg via INTRAVENOUS
  Filled 2016-05-20: qty 2

## 2016-05-20 NOTE — ED Provider Notes (Signed)
CSN: [id number]     Arrival date & time 05/20/16  2122 History   First MD Initiated Contact with Patient 05/20/16 2217     Chief Complaint  Patient presents with  . Emesis     (Consider location/radiation/quality/duration/timing/severity/associated sxs/prior Treatment) HPI  45 year old male with a history of diabetes presents with vomiting. He has been vomiting for about one week. However today he has been unable to keep down even any fluids. Patient has not been taking his insulin or oral glucose medicines because he has not been able to eat. Patient's glucose has been in the 300s. EMS noted patient to be orthostatically hypotensive in the field. Patient denies chest pain but states she's been having some upper abdominal pain. He has been having issues with GERD and vomiting for the past several months. He has a GI appointment tomorrow. His sister is requesting that he have an EGD tomorrow.  Past Medical History  Diagnosis Date  . Peripheral neuropathy (HCC)   . Acute osteomyelitis of metatarsal bone of left foot (HCC) 11/12/2015  . Diabetic peripheral neuropathy associated with type 2 diabetes mellitus (HCC) 08/30/2008    Qualifier: Diagnosis of  By: Daphine Deutscher FNP, Zena Amos    . Closed fracture of right tibial plateau 11/11/2015  . Vitamin D deficiency 11/12/2015  . Osteoporosis 11/12/2015  . GERD (gastroesophageal reflux disease)   . Diabetes mellitus     INSULIN DEPENDENT Type II  . Diabetic ulcer of left foot (HCC) 11/14/2015  . Shortness of breath dyspnea     with exertion   Past Surgical History  Procedure Laterality Date  . Knee surgery Left   . Shoulder surgery Right   . Toe amputation Left 11/28/15    4th toe  . 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 Anterior vitrectomy Left 04/12/2016    Procedure: ANTERIOR CHAMBER WASH OUT WITH GAS FLUID EXCHANGE;  Surgeon:  Carmela Rima, MD;  Location: [facility] OR;  Service: Ophthalmology;  Laterality: Left;  Marland Kitchen Eye surgery      left eye surgery 04/2016   Family History  Problem Relation Age of Onset  . Diabetes Mother   . Cancer Father   . Diabetes Sister   . Diabetes Maternal Uncle   . Diabetes Maternal Grandmother   . Cancer Paternal Grandmother    Social History  Substance Use Topics  . Smoking status: Never Smoker   . Smokeless tobacco: Never Used  . Alcohol Use: No    Review of Systems  Constitutional: Negative for fever.  Respiratory: Negative for shortness of breath.   Cardiovascular: Negative for chest pain.  Gastrointestinal: Positive for nausea, vomiting and abdominal pain. Negative for diarrhea.  Genitourinary: Negative for dysuria, frequency and decreased urine volume.  All other systems reviewed and are negative.     Allergies  Review of patient's allergies indicates no known allergies.  Home Medications   Prior to Admission medications   Medication Sig Start Date End Date Taking? Authorizing Provider  acetaZOLAMIDE (DIAMOX) 250 MG tablet Take 1 tablet (250 mg total) by mouth 2 (two) times daily. 04/13/16  Yes Rolly Salter, MD  insulin aspart (NOVOLOG) 100 UNIT/ML FlexPen Inject 0-15 Units into the skin 4 (four) times daily -  with meals and at bedtime. Dose based on blood sugars taken before meals&bedtime. 05/11/16  Yes [name], FNP  insulin detemir (LEVEMIR)  100 UNIT/ML injection Inject 0.2 mLs (20 Units total) into the skin at bedtime. 05/11/16  Yes [name], FNP  metFORMIN (GLUCOPHAGE) 500 MG tablet Take 1 tablet (500 mg total) by mouth 2 (two) times daily with a meal. 05/11/16  Yes [name], FNP  ofloxacin (OCUFLOX) 0.3 % ophthalmic solution Place 1 drop into the left eye 4 (four) times daily. 04/13/16  Yes Rolly Salter, MD  pantoprazole (PROTONIX) 40 MG tablet Take 1 tablet (40 mg total) by mouth 2 (two) times daily before a meal. 04/13/16  Yes Rolly Salter,  MD  ranitidine (ZANTAC) 300 MG tablet Take 1 tablet (300 mg total) by mouth at bedtime. 04/30/16  Yes [name], FNP  alum & mag hydroxide-simeth (MAALOX/MYLANTA) 200-200-20 MG/5ML suspension Take 15 mLs by mouth every 4 (four) hours as needed for indigestion or heartburn. 04/13/16   Rolly Salter, MD  ondansetron (ZOFRAN ODT) 4 MG disintegrating tablet Take 1 tablet (4 mg total) by mouth every 8 (eight) hours as needed for nausea or vomiting. 04/13/16   Rolly Salter, MD  promethazine (PHENERGAN) 12.5 MG tablet Take 1 tablet (12.5 mg total) by mouth every 8 (eight) hours as needed for nausea or vomiting. 04/23/16   [name], FNP   BP 138/97 mmHg  Pulse 94  Temp(Src) 98.3 F (36.8 C) (Oral)  Resp 18  SpO2 100% Physical Exam  Constitutional: He is oriented to person, place, and time. He appears well-developed and well-nourished. No distress.  HENT:  Head: Normocephalic and atraumatic.  Right Ear: External ear normal.  Left Ear: External ear normal.  Nose: Nose normal.  Mouth/Throat: Mucous membranes are dry.  Eyes: Right eye exhibits no discharge. Left eye exhibits no discharge.  Neck: Neck supple.  Cardiovascular: Normal rate, regular rhythm, normal heart sounds and intact distal pulses.   Pulmonary/Chest: Effort normal and breath sounds normal.  Abdominal: Soft. He exhibits no distension. There is no tenderness.  Musculoskeletal: He exhibits no edema.  Neurological: He is alert and oriented to person, place, and time.  Skin: Skin is warm and dry. He is not diaphoretic.  Nursing note and vitals reviewed.   ED Course  Procedures (including critical care time) Labs Review Labs Reviewed  CBC WITH DIFFERENTIAL/PLATELET - Abnormal; Notable for the following:    WBC 12.6 (*)    HCT 36.1 (*)    MCHC 36.8 (*)    Neutro Abs 9.0 (*)    All other components within normal limits  COMPREHENSIVE METABOLIC PANEL - Abnormal; Notable for the following:    Sodium 131 (*)     Potassium 3.2 (*)    Chloride 99 (*)    CO2 16 (*)    Glucose, Bld 234 (*)    Total Bilirubin 3.3 (*)    Anion gap 16 (*)    All other components within normal limits  URINALYSIS, ROUTINE W REFLEX MICROSCOPIC (NOT AT [facility]) - Abnormal; Notable for the following:    Glucose, UA >1000 (*)    Hgb urine dipstick TRACE (*)    Bilirubin Urine SMALL (*)    Ketones, ur >80 (*)    Protein, ur 30 (*)    All other components within normal limits  BLOOD GAS, VENOUS - Abnormal; Notable for the following:    pH, Ven 7.452 (*)    pCO2, Ven 22.1 (*)    pO2, Ven 84.3 (*)    Bicarbonate 15.2 (*)    Acid-base deficit 6.6 (*)  All other components within normal limits  URINE MICROSCOPIC-ADD ON - Abnormal; Notable for the following:    Squamous Epithelial / LPF 0-5 (*)    Bacteria, UA RARE (*)    Casts HYALINE CASTS (*)    All other components within normal limits  CBG MONITORING, ED    Imaging Review No results found. I have personally reviewed and evaluated these images and lab results as part of my medical decision-making.   EKG Interpretation   Date/Time:  Thursday May 20 2016 22:46:57 EDT Ventricular Rate:  94 PR Interval:  133 QRS Duration: 108 QT Interval:  391 QTC Calculation: 489 R Axis:   107 Text Interpretation:  Sinus rhythm Anterolateral infarct, age  indeterminate no significant change since Dec 2016 Confirmed by Criss AlvineGOLDSTON  MD, Senna Lape [phone]([id number]) on 05/21/2016 12:17:29 AM      MDM   Final diagnoses:  Vomiting  DKA  Patient feels much better after Reglan. He was also given 2 L IV fluid bolus. Having his labs represent decompensated acidosis is likely DKA. Patient will be admitted for IV insulin as well as IV D5 drip. Admit to the step down unit. Dr. Toniann FailKakrakandy to admit.    Pricilla LovelessScott Cleone Hulick, MD 05/21/16 [phone]

## 2016-05-20 NOTE — ED Notes (Signed)
Bed: WA02 Expected date:  Expected time:  Means of arrival:  Comments: N, v

## 2016-05-20 NOTE — ED Notes (Signed)
Pt with emesis x1 week. Called EMS and they report that pt was positive for othostatic hypotension on the scene with systolic BP dropping from 140s-90s.

## 2016-05-20 NOTE — ED Notes (Signed)
Pt hasn't been taking oral hyperglycemia meds x 1 week.

## 2016-05-21 ENCOUNTER — Observation Stay (HOSPITAL_COMMUNITY): Payer: Medicaid Other

## 2016-05-21 ENCOUNTER — Encounter (HOSPITAL_COMMUNITY): Payer: Self-pay | Admitting: Internal Medicine

## 2016-05-21 ENCOUNTER — Ambulatory Visit: Payer: Medicaid Other | Admitting: Family Medicine

## 2016-05-21 DIAGNOSIS — R112 Nausea with vomiting, unspecified: Secondary | ICD-10-CM | POA: Diagnosis not present

## 2016-05-21 DIAGNOSIS — E8729 Other acidosis: Secondary | ICD-10-CM | POA: Diagnosis present

## 2016-05-21 DIAGNOSIS — K3184 Gastroparesis: Secondary | ICD-10-CM

## 2016-05-21 DIAGNOSIS — E1143 Type 2 diabetes mellitus with diabetic autonomic (poly)neuropathy: Secondary | ICD-10-CM | POA: Insufficient documentation

## 2016-05-21 DIAGNOSIS — E440 Moderate protein-calorie malnutrition: Secondary | ICD-10-CM | POA: Insufficient documentation

## 2016-05-21 DIAGNOSIS — E872 Acidosis: Secondary | ICD-10-CM

## 2016-05-21 DIAGNOSIS — E1110 Type 2 diabetes mellitus with ketoacidosis without coma: Secondary | ICD-10-CM | POA: Diagnosis present

## 2016-05-21 LAB — BASIC METABOLIC PANEL
ANION GAP: 10 (ref 5–15)
ANION GAP: 9 (ref 5–15)
ANION GAP: 10 (ref 5–15)
BUN: 19 mg/dL (ref 6–20)
BUN: 16 mg/dL (ref 6–20)
BUN: 14 mg/dL (ref 6–20)
CALCIUM: 8.7 mg/dL — AB (ref 8.9–10.3)
CALCIUM: 8.8 mg/dL — AB (ref 8.9–10.3)
CHLORIDE: 104 mmol/L (ref 101–111)
CHLORIDE: 104 mmol/L (ref 101–111)
CHLORIDE: 103 mmol/L (ref 101–111)
CO2: 19 mmol/L — AB (ref 22–32)
CO2: 19 mmol/L — AB (ref 22–32)
CO2: 18 mmol/L — AB (ref 22–32)
CREATININE: 0.75 mg/dL (ref 0.61–1.24)
CREATININE: 0.66 mg/dL (ref 0.61–1.24)
CREATININE: 0.67 mg/dL (ref 0.61–1.24)
Calcium: 8.8 mg/dL — ABNORMAL LOW (ref 8.9–10.3)
GFR calc non Af Amer: >60 mL/min (ref >60)
GFR calc non Af Amer: >60 mL/min (ref >60)
GFR calc non Af Amer: >60 mL/min (ref >60)
Glucose, Bld: 211 mg/dL — ABNORMAL HIGH (ref 65–99)
Glucose, Bld: 153 mg/dL — ABNORMAL HIGH (ref 65–99)
Glucose, Bld: 137 mg/dL — ABNORMAL HIGH (ref 65–99)
Potassium: 2.9 mmol/L — ABNORMAL LOW (ref 3.5–5.1)
Potassium: 3.2 mmol/L — ABNORMAL LOW (ref 3.5–5.1)
Potassium: 3.2 mmol/L — ABNORMAL LOW (ref 3.5–5.1)
SODIUM: 133 mmol/L — AB (ref 135–145)
SODIUM: 132 mmol/L — AB (ref 135–145)
SODIUM: 131 mmol/L — AB (ref 135–145)

## 2016-05-21 LAB — BLOOD GAS, VENOUS
ACID-BASE DEFICIT: 6.6 mmol/L — AB (ref 0.0–2.0)
BICARBONATE: 15.2 meq/L — AB (ref 20.0–24.0)
FIO2: 0.21
O2 SAT: 96.7 %
PATIENT TEMPERATURE: 98.6
PO2 VEN: 84.3 mmHg — AB (ref 31.0–45.0)
TCO2: 13.3 mmol/L (ref 0–100)
pCO2, Ven: 22.1 mmHg — ABNORMAL LOW (ref 45.0–50.0)
pH, Ven: 7.452 — ABNORMAL HIGH (ref 7.250–7.300)

## 2016-05-21 LAB — CBC
HCT: 31.9 % — ABNORMAL LOW (ref 39.0–52.0)
HEMOGLOBIN: 11.8 g/dL — AB (ref 13.0–17.0)
MCH: 30.3 pg (ref 26.0–34.0)
MCHC: 37 g/dL — ABNORMAL HIGH (ref 30.0–36.0)
MCV: 82 fL (ref 78.0–100.0)
PLATELETS: 312 10*3/uL (ref 150–400)
RBC: 3.89 MIL/uL — AB (ref 4.22–5.81)
RDW: 13.6 % (ref 11.5–15.5)
WBC: 9.7 10*3/uL (ref 4.0–10.5)

## 2016-05-21 LAB — GLUCOSE, CAPILLARY
GLUCOSE-CAPILLARY: 204 mg/dL — AB (ref 65–99)
GLUCOSE-CAPILLARY: 142 mg/dL — AB (ref 65–99)
GLUCOSE-CAPILLARY: 128 mg/dL — AB (ref 65–99)
GLUCOSE-CAPILLARY: 167 mg/dL — AB (ref 65–99)
Glucose-Capillary: 207 mg/dL — ABNORMAL HIGH (ref 65–99)
Glucose-Capillary: 139 mg/dL — ABNORMAL HIGH (ref 65–99)
Glucose-Capillary: 138 mg/dL — ABNORMAL HIGH (ref 65–99)
Glucose-Capillary: 137 mg/dL — ABNORMAL HIGH (ref 65–99)
Glucose-Capillary: 120 mg/dL — ABNORMAL HIGH (ref 65–99)

## 2016-05-21 LAB — URINALYSIS, ROUTINE W REFLEX MICROSCOPIC
Ketones, ur: >80 mg/dL — AB
Leukocytes, UA: NEGATIVE
NITRITE: NEGATIVE
PH: 5.5 (ref 5.0–8.0)
Protein, ur: 30 mg/dL — AB
SPECIFIC GRAVITY, URINE: 1.03 (ref 1.005–1.030)

## 2016-05-21 LAB — RAPID URINE DRUG SCREEN, HOSP PERFORMED
Amphetamines: NOT DETECTED
Barbiturates: NOT DETECTED
Benzodiazepines: NOT DETECTED
Cocaine: NOT DETECTED
OPIATES: NOT DETECTED
Tetrahydrocannabinol: POSITIVE — AB

## 2016-05-21 LAB — MAGNESIUM: Magnesium: 1.8 mg/dL (ref 1.7–2.4)

## 2016-05-21 LAB — CBG MONITORING, ED: GLUCOSE-CAPILLARY: 192 mg/dL — AB (ref 65–99)

## 2016-05-21 LAB — URINE MICROSCOPIC-ADD ON

## 2016-05-21 LAB — TROPONIN I

## 2016-05-21 LAB — BETA-HYDROXYBUTYRIC ACID: BETA-HYDROXYBUTYRIC ACID: 1.64 mmol/L — AB (ref 0.05–0.27)

## 2016-05-21 LAB — MRSA PCR SCREENING: MRSA BY PCR: NEGATIVE

## 2016-05-21 MED ORDER — SODIUM CHLORIDE 0.9 % IV SOLN: INTRAVENOUS | Status: DC

## 2016-05-21 MED ORDER — POTASSIUM CHLORIDE 10 MEQ/100ML IV SOLN
10.0000 meq | INTRAVENOUS | Status: AC
Start: 2016-05-21 — End: 2016-05-21
  Administered 2016-05-21 (×4): 10 meq via INTRAVENOUS
  Filled 2016-05-21 (×4): qty 100

## 2016-05-21 MED ORDER — INSULIN DETEMIR 100 UNIT/ML ~~LOC~~ SOLN
20.0000 [IU] | Freq: Every day | SUBCUTANEOUS | Status: DC
  Administered 2016-05-21 – 2016-05-22 (×2): 20 [IU] via SUBCUTANEOUS
  Filled 2016-05-21 (×3): qty 0.2

## 2016-05-21 MED ORDER — INSULIN REGULAR HUMAN 100 UNIT/ML IJ SOLN
INTRAMUSCULAR | Status: DC
  Administered 2016-05-21: 1.3 [IU]/h via INTRAVENOUS
  Filled 2016-05-21: qty 2.5

## 2016-05-21 MED ORDER — POTASSIUM CHLORIDE CRYS ER 20 MEQ PO TBCR: 40.0000 meq | EXTENDED_RELEASE_TABLET | Freq: Four times a day (QID) | ORAL | Status: DC

## 2016-05-21 MED ORDER — INSULIN ASPART 100 UNIT/ML ~~LOC~~ SOLN
0.0000 [IU] | Freq: Three times a day (TID) | SUBCUTANEOUS | Status: DC
  Administered 2016-05-21: 3 [IU] via SUBCUTANEOUS
  Administered 2016-05-21 – 2016-05-22 (×2): 2 [IU] via SUBCUTANEOUS
  Administered 2016-05-22: 3 [IU] via SUBCUTANEOUS

## 2016-05-21 MED ORDER — INSULIN ASPART 100 UNIT/ML ~~LOC~~ SOLN: 0.0000 [IU] | Freq: Every day | SUBCUTANEOUS | Status: DC

## 2016-05-21 MED ORDER — ONDANSETRON HCL 4 MG/2ML IJ SOLN
4.0000 mg | Freq: Four times a day (QID) | INTRAMUSCULAR | Status: DC | PRN
Start: 2016-05-21 — End: 2016-05-23
  Administered 2016-05-21 – 2016-05-22 (×5): 4 mg via INTRAVENOUS
  Filled 2016-05-21 (×5): qty 2

## 2016-05-21 MED ORDER — DEXTROSE-NACL 5-0.45 % IV SOLN
INTRAVENOUS | Status: DC
  Administered 2016-05-21: 02:00:00 via INTRAVENOUS

## 2016-05-21 MED ORDER — METOCLOPRAMIDE HCL 5 MG/ML IJ SOLN
10.0000 mg | Freq: Three times a day (TID) | INTRAMUSCULAR | Status: DC
  Administered 2016-05-21 – 2016-05-23 (×7): 10 mg via INTRAVENOUS
  Filled 2016-05-21 (×7): qty 2

## 2016-05-21 MED ORDER — SODIUM CHLORIDE 0.9 % IV SOLN
INTRAVENOUS | Status: DC
Start: 2016-05-21 — End: 2016-05-21

## 2016-05-21 MED ORDER — ZOLPIDEM TARTRATE 5 MG PO TABS
5.0000 mg | ORAL_TABLET | Freq: Every evening | ORAL | Status: DC | PRN
  Administered 2016-05-21 – 2016-05-22 (×2): 5 mg via ORAL
  Filled 2016-05-21 (×2): qty 1

## 2016-05-21 MED ORDER — DEXTROSE-NACL 5-0.45 % IV SOLN
INTRAVENOUS | Status: DC
  Administered 2016-05-21: 03:00:00 via INTRAVENOUS

## 2016-05-21 MED ORDER — SODIUM CHLORIDE 0.9 % IV SOLN
INTRAVENOUS | Status: DC
  Administered 2016-05-21 – 2016-05-22 (×2): via INTRAVENOUS

## 2016-05-21 MED ORDER — SODIUM CHLORIDE 0.9 % IV SOLN
INTRAVENOUS | Status: DC
  Administered 2016-05-21: 4.3 [IU]/h via INTRAVENOUS
  Filled 2016-05-21: qty 2.5

## 2016-05-21 MED ORDER — FAMOTIDINE IN NACL 20-0.9 MG/50ML-% IV SOLN
20.0000 mg | Freq: Two times a day (BID) | INTRAVENOUS | Status: DC
  Administered 2016-05-21 – 2016-05-22 (×5): 20 mg via INTRAVENOUS
  Filled 2016-05-21 (×5): qty 50

## 2016-05-21 MED ORDER — ENOXAPARIN SODIUM 40 MG/0.4ML ~~LOC~~ SOLN
40.0000 mg | SUBCUTANEOUS | Status: DC
Start: 2016-05-21 — End: 2016-05-23
  Administered 2016-05-21 – 2016-05-22 (×2): 40 mg via SUBCUTANEOUS
  Filled 2016-05-21 (×2): qty 0.4

## 2016-05-21 NOTE — Progress Notes (Signed)
NUTRITION NOTE  Pt screened for MST. Visited pt's room a few minutes ago and pt requests that RD stop back when his sister is present so that she may provide information. Pt reports that sister will be able to recall more information from PTA than he is able to at this time. Will continue to check in for ability to speak with sister today.    Trenton GammonJessica Clint Biello, RD, LDN Inpatient Clinical Dietitian Pager # [phone] After hours/weekend pager # [phone]

## 2016-05-21 NOTE — H&P (Signed)
History and Physical    Travis CabalMichael T Aldava ZOX:[id number] DOB: 05/04/1971 DOA: 05/20/2016  PCP: Massie MaroonHollis,Lachina M, FNP  Patient coming from: Home.  Chief Complaint: Nausea vomiting.  HPI: Travis [name] is a 45 y.o. male with medical history significant of diabetes mellitus type 2 with history of recurrent nausea vomiting presents to the ER because of persistent vomiting over the last 3-4 days. Denies any abdominal pain. Patient had one episode of diarrhea yesterday and one large bowel movement which was loose in the ER. Denies any recent use of antibiotics. [facility] another time patient had nausea vomiting. Denies any eye pain or headache at this time. Patient states since patient had multiple episodes of nausea vomiting has not taken his insulin 3-4 days. Acute abdominal series is unremarkable in the abdomen appears benign. Patient's lab reveal metabolic acidosis with urine showing ketosis and elevated glucose levels. Patient was started on IV fluid bolus and IV insulin infusion.   ED Course: IV fluid bolus and IV insulin infusion has been started.  Review of Systems: As per HPI, rest all negative.   Past Medical History  Diagnosis Date  . Peripheral neuropathy (HCC)   . Acute osteomyelitis of metatarsal bone of left foot (HCC) 11/12/2015  . Diabetic peripheral neuropathy associated with type 2 diabetes mellitus (HCC) 08/30/2008    Qualifier: Diagnosis of  By: Daphine DeutscherMartin FNP, Zena AmosNykedtra    . Closed fracture of right tibial plateau 11/11/2015  . Vitamin D deficiency 11/12/2015  . Osteoporosis 11/12/2015  . GERD (gastroesophageal reflux disease)   . Diabetes mellitus     INSULIN DEPENDENT Type II  . Diabetic ulcer of left foot (HCC) 11/14/2015  . Shortness of breath dyspnea     with exertion    Past Surgical History  Procedure Laterality Date  . Knee surgery Left   . Shoulder surgery Right   . Toe amputation Left 11/28/15    4th  toe  . 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 Anterior vitrectomy Left 04/12/2016    Procedure: ANTERIOR CHAMBER WASH OUT WITH GAS FLUID EXCHANGE;  Surgeon: Carmela RimaNarendra Patel, MD;  [facility];  Service: Ophthalmology;  Laterality: Left;  Marland Kitchen. Eye surgery      left eye surgery 04/2016     reports that he has never smoked. He has never used smokeless tobacco. He reports that he does not drink alcohol or use illicit drugs.  No Known Allergies  Family History  Problem Relation Age of Onset  . Diabetes Mother   . Cancer Father   . Diabetes Sister   . Diabetes Maternal Uncle   . Diabetes Maternal Grandmother   . Cancer Paternal Grandmother     Prior to Admission medications   Medication Sig Start Date End Date Taking? Authorizing Provider  acetaZOLAMIDE (DIAMOX) 250 MG tablet Take 1 tablet (250 mg total) by mouth 2 (two) times daily. 04/13/16  Yes Rolly SalterPranav M Patel, MD  insulin aspart (NOVOLOG) 100 UNIT/ML FlexPen Inject 0-15 Units into the skin 4 (four) times daily -  with meals and at bedtime. Dose based on blood sugars taken before meals&bedtime. 05/11/16  Yes Massie MaroonLachina M Hollis, FNP  insulin detemir (LEVEMIR) 100 UNIT/ML injection Inject 0.2 mLs (20 Units total) into the skin at bedtime. 05/11/16  Yes Massie MaroonLachina M Hollis, FNP  metFORMIN (GLUCOPHAGE) 500  MG tablet Take 1 tablet (500 mg total) by mouth 2 (two) times daily with a meal. 05/11/16  Yes [name], FNP  ofloxacin (OCUFLOX) 0.3 % ophthalmic solution Place 1 drop into the left eye 4 (four) times daily. 04/13/16  Yes Rolly Salter, MD  pantoprazole (PROTONIX) 40 MG tablet Take 1 tablet (40 mg total) by mouth 2 (two) times daily before a meal. 04/13/16  Yes Rolly Salter, MD  ranitidine (ZANTAC) 300 MG tablet Take 1 tablet (300 mg total) by mouth at bedtime. 04/30/16  Yes [name], FNP  alum & mag hydroxide-simeth  (MAALOX/MYLANTA) 200-200-20 MG/5ML suspension Take 15 mLs by mouth every 4 (four) hours as needed for indigestion or heartburn. 04/13/16   Rolly Salter, MD  ondansetron (ZOFRAN ODT) 4 MG disintegrating tablet Take 1 tablet (4 mg total) by mouth every 8 (eight) hours as needed for nausea or vomiting. 04/13/16   Rolly Salter, MD  promethazine (PHENERGAN) 12.5 MG tablet Take 1 tablet (12.5 mg total) by mouth every 8 (eight) hours as needed for nausea or vomiting. 04/23/16   [name], FNP    Physical Exam: Filed Vitals:   05/20/16 2127 05/20/16 2131 05/21/16 0232  BP:  138/97 149/83  Pulse:  94   Temp:  98.3 F (36.8 C) 98.6 F (37 C)  TempSrc:  Oral Oral  Resp:  18 26  Height:   6\' 2"  (1.88 m)  Weight:   185 lb 10 oz (84.2 kg)  SpO2: 98% 100% 94%      Constitutional: Not in distress. Filed Vitals:   05/20/16 2127 05/20/16 2131 05/21/16 0232  BP:  138/97 149/83  Pulse:  94   Temp:  98.3 F (36.8 C) 98.6 F (37 C)  TempSrc:  Oral Oral  Resp:  18 26  Height:   6\' 2"  (1.88 m)  Weight:   185 lb 10 oz (84.2 kg)  SpO2: 98% 100% 94%   Eyes: Anicteric no pallor. Left eye has poor vision. ENMT: No discharge from the ears eyes nose or mouth. Neck: No mass felt. No JVD appreciated. Respiratory: No rhonchi or crepitations. Cardiovascular: S1 and S2 heard. Abdomen: Soft nontender bowel sounds present. Musculoskeletal: Left foot toe has been amputated. Skin: No rash. Neurologic: Alert awake oriented to time place and person. Moves all extremities. Psychiatric: Appears normal.   Labs on Admission: I have personally reviewed following labs and imaging studies  CBC:  Recent Labs Lab 05/20/16 2312  WBC 12.6*  NEUTROABS 9.0*  HGB 13.3  HCT 36.1*  MCV 80.2  PLT 359   Basic Metabolic Panel:  Recent Labs Lab 05/20/16 2312  NA 131*  K 3.2*  CL 99*  CO2 16*  GLUCOSE 234*  BUN 19  CREATININE 0.83  CALCIUM 9.4   GFR: Estimated Creatinine Clearance: 132 mL/min (by  C-G formula based on Cr of 0.83). Liver Function Tests:  Recent Labs Lab 05/20/16 2312  AST 19  ALT 22  ALKPHOS 50  BILITOT 3.3*  PROT 7.6  ALBUMIN 4.7   No results for input(s): LIPASE, AMYLASE in the last 168 hours. No results for input(s): AMMONIA in the last 168 hours. Coagulation Profile: No results for input(s): INR, PROTIME in the last 168 hours. Cardiac Enzymes: No results for input(s): CKTOTAL, CKMB, CKMBINDEX, TROPONINI in the last 168 hours. BNP (last 3 results) No results for input(s): PROBNP in the last 8760 hours. HbA1C: No results for input(s): HGBA1C in the last  72 hours. CBG:  Recent Labs Lab 05/21/16 0139  GLUCAP 192*   Lipid Profile: No results for input(s): CHOL, HDL, LDLCALC, TRIG, CHOLHDL, LDLDIRECT in the last 72 hours. Thyroid Function Tests: No results for input(s): TSH, T4TOTAL, FREET4, T3FREE, THYROIDAB in the last 72 hours. Anemia Panel: No results for input(s): VITAMINB12, FOLATE, FERRITIN, TIBC, IRON, RETICCTPCT in the last 72 hours. Urine analysis:    Component Value Date/Time   COLORURINE YELLOW 05/21/2016 0028   APPEARANCEUR CLEAR 05/21/2016 0028   LABSPEC 1.030 05/21/2016 0028   PHURINE 5.5 05/21/2016 0028   GLUCOSEU >1000* 05/21/2016 0028   HGBUR TRACE* 05/21/2016 0028   HGBUR negative 09/17/2009 0834   BILIRUBINUR SMALL* 05/21/2016 0028   KETONESUR >80* 05/21/2016 0028   PROTEINUR 30* 05/21/2016 0028   UROBILINOGEN 1.0 04/30/2016 1338   NITRITE NEGATIVE 05/21/2016 0028   LEUKOCYTESUR NEGATIVE 05/21/2016 0028   Sepsis Labs: (procalcitonin:4,lacticidven:4) )No results found for this or any previous visit (from the past 240 hour(s)).   Radiological Exams on Admission: Dg Abd 2 Views  05/21/2016  CLINICAL DATA:  Vomiting. EXAM: ABDOMEN - 2 VIEW COMPARISON:  None. FINDINGS: The bowel gas pattern is normal. There is no evidence of free air. No radio-opaque calculi or other significant radiographic abnormality is seen.  Lung bases are clear. IMPRESSION: Negative. Electronically Signed   By: Marnee Spring M.D.   On: 05/21/2016 02:00    EKG: Independently reviewed.  Normal sinus rhythm.  Assessment/Plan Principal Problem:   Nausea & vomiting Active Problems:   DKA (diabetic ketoacidoses) (HCC)   Ketoacidosis    #1. Diabetic ketoacidosis - probably dissuaded by patient not taking his insulin and persistent vomiting. Patient has been placed on IV fluids IV insulin infusion and closely follow metabolic panel. Once again gets corrected change to long-acting insulin. Patient may also have component of starvation ketoacidosis. #2. Nausea vomiting and diarrhea - patient had diarrhea only last night and once day before. Patient is more concerned about his nausea vomiting. Patient is having an appointment with [facility] later this week. Consult [facility] in a.m. for further recommendations. I think patient probably has gastroparesis. Abdomen appears benign at this time on exam. Since patient also had diarrhea we will check stool studies for C. difficile.   DVT prophylaxis: Lovenox. Code Status: Full code.  Family Communication: Patient's sister at the bedside.  Disposition Plan: Home.  Consults called: None.  Admission status: Inpatient. Stepdown. Likely stay 2-3 days.    Eduard Clos MD [facility] Pager [phone].  If 7PM-7AM, please contact night-coverage [email or web address] Password [email or web address]  05/21/2016, 3:02 AM

## 2016-05-21 NOTE — Progress Notes (Signed)
PROGRESS NOTE    STEFFAN CANIGLIA  ZOX:[id number] DOB: [birth year] DOA: 05/20/2016 PCP: [name], FNP    Brief Narrative: Mr. Marian is a 45 year old gentleman with a past medical history of insulin dependent diabetes mellitus, admitted to medicine service on 05/21/2016 when he presented with complaints of intractable nausea and vomiting. He has had recent emergency room visits for intractable nausea and vomiting. Initial blood work in the emergency room revealed an anion gap of 16 with a bicarbonate of 16. His blood sugar was 234. Mild diabetic ketoacidosis was suspected and a contribute factor to his nausea and vomiting. He was placed on IV insulin and admitted to the stepdown unit.    Assessment & Plan:   Principal Problem:   Nausea & vomiting Active Problems:   DKA (diabetic ketoacidoses) (HCC)   Ketoacidosis  1.  Diabetic ketoacidosis -Mr. Tangonan is a 45 year old gentleman presenting overnight to the emergency department with intractable nausea and vomiting. Initial blood work showing an elevated and a gap of 16 with bicarbonate of 16. Urinalysis showed the presence of ketones. -He was placed on the DKA protocol and treated with IV insulin and IV fluids -Overnight his gap closing -This morning will discontinue IV insulin and D5 half-normal saline, advance diet and give dose of Levemir 20 units -Follow closely  2.  Intractable nausea and vomiting -He presents with diabetic ketoacidosis although I think that diabetic gastroparesis is a contributing factor. -Will schedule Reglan 10 mg IV 3 times a day today -Continue supportive care  3.  Hypokalemia. -Likely related to diabetic ketoacidosis and GI losses from nausea and vomiting -Overnight he was replaced with IV potassium   DVT prophylaxis: Lovenox Code Status: Full code Family Communication: Disposition Plan: Transitioning him to basal insulin today will watch the stepdown unit  Subjective: States feeling little  better this morning, hungry, would like to eat some eggs  Objective: Filed Vitals:   05/21/16 0232 05/21/16 0400 05/21/16 0627 05/21/16 0730  BP: 149/83 149/81 121/79 144/80  Pulse:  85 89 80  Temp: 98.6 F (37 C)     TempSrc: Oral     Resp: Height:  (1.88 m)     Weight: 84.2 kg (185 lb 10 oz)     SpO2: 94% 98% 98% 99%    Intake/Output Summary (Last 24 hours) at 05/21/16 0800 Last data filed at 05/21/16 0700  Gross per 24 hour  Intake 900.82 ml  Output    325 ml  Net 575.82 ml   Filed Weights   05/21/16 0232  Weight: 84.2 kg (185 lb 10 oz)    Examination:  General exam: Appears calm and comfortable, Nontoxic-appearing Respiratory system: Clear to auscultation. Respiratory effort normal. Cardiovascular system: S1 & S2 heard, RRR. No JVD, murmurs, rubs, gallops or clicks. No pedal edema. Gastrointestinal system: He has mild generalized tenderness to palpation across abdomen, no rebound tenderness or guarding Central nervous system: Alert and oriented. No focal neurological deficits. Extremities: Symmetric 5 x 5 power. Status post fourth toe amputation 2 left  Skin: No rashes, lesions or ulcers Psychiatry: Judgement and insight appear normal. Mood & affect appropriate.     Data Reviewed: I have personally reviewed following labs and imaging studies  CBC:  Recent Labs Lab 05/20/16 2312 05/21/16 0311  WBC 12.6* 9.7  NEUTROABS 9.0*  --   HGB 13.3 11.8*  HCT 36.1* 31.9*  MCV 80.2 82.0  PLT 359 312   Basic Metabolic Panel:  Recent Labs Lab 05/20/16 2312 05/21/16 0311 05/21/16 0709  NA 131* 133* 132*  K 3.2* 2.9* 3.2*  CL 99* 104 104  CO2 16* 19* 19*  GLUCOSE 234* 211* 153*  BUN 19 19 16   CREATININE 0.83 0.75 0.66  CALCIUM 9.4 8.8* 8.7*   GFR: Estimated Creatinine Clearance: 137 mL/min (by C-G formula based on Cr of 0.66). Liver Function Tests:  Recent Labs Lab 05/20/16 2312  AST 19  ALT 22  ALKPHOS 50  BILITOT 3.3*  PROT 7.6   ALBUMIN 4.7   No results for input(s): LIPASE, AMYLASE in the last 168 hours. No results for input(s): AMMONIA in the last 168 hours. Coagulation Profile: No results for input(s): INR, PROTIME in the last 168 hours. Cardiac Enzymes:  Recent Labs Lab 05/21/16 0311  TROPONINI <0.03   BNP (last 3 results) No results for input(s): PROBNP in the last 8760 hours. HbA1C: No results for input(s): HGBA1C in the last 72 hours. CBG:  Recent Labs Lab 05/21/16 0139 05/21/16 0241 05/21/16 0345 05/21/16 0448 05/21/16 0546  GLUCAP 192* 207* 204* 142* 128*   Lipid Profile: No results for input(s): CHOL, HDL, LDLCALC, TRIG, CHOLHDL, LDLDIRECT in the last 72 hours. Thyroid Function Tests: No results for input(s): TSH, T4TOTAL, FREET4, T3FREE, THYROIDAB in the last 72 hours. Anemia Panel: No results for input(s): VITAMINB12, FOLATE, FERRITIN, TIBC, IRON, RETICCTPCT in the last 72 hours. Sepsis Labs: No results for input(s): PROCALCITON, LATICACIDVEN in the last 168 hours.  Recent Results (from the past 240 hour(s))  MRSA PCR Screening     Status: None   Collection Time: 05/21/16  2:33 AM  Result Value Ref Range Status   MRSA by PCR NEGATIVE NEGATIVE Final    Comment:        The GeneXpert MRSA Assay (FDA approved for NASAL specimens only), is one component of a comprehensive MRSA colonization surveillance program. It is not intended to diagnose MRSA infection nor to guide or monitor treatment for MRSA infections.          Radiology Studies: Dg Abd 2 Views  05/21/2016  CLINICAL DATA:  Vomiting. EXAM: ABDOMEN - 2 VIEW COMPARISON:  None. FINDINGS: The bowel gas pattern is normal. There is no evidence of free air. No radio-opaque calculi or other significant radiographic abnormality is seen. Lung bases are clear. IMPRESSION: Negative. Electronically Signed   By: Marnee SpringJonathon  Watts M.D.   On: 05/21/2016 02:00        Scheduled Meds: . enoxaparin (LOVENOX) injection  40 mg  Subcutaneous Q24H  . famotidine (PEPCID) IV  20 mg Intravenous Q12H  . insulin aspart  0-15 Units Subcutaneous TID WC  . insulin aspart  0-5 Units Subcutaneous QHS  . insulin detemir  20 Units Subcutaneous Daily  . metoCLOPramide (REGLAN) injection  10 mg Intravenous Q8H  . potassium chloride  40 mEq Oral Q6H   Continuous Infusions: . sodium chloride       LOS: 0 days    Time spent:     Jeralyn BennettZAMORA, Awilda Covin, MD [facility] Pager [phone]  If 7PM-7AM, please contact night-coverage [email or web address] Password [email or web address] 05/21/2016, 8:00 AM

## 2016-05-21 NOTE — Progress Notes (Signed)
Initial Nutrition Assessment  DOCUMENTATION CODES:   Non-severe (moderate) malnutrition in context of acute illness/injury  INTERVENTION:  - Diet advancement as medically feasible - Will monitor for needs with improving N/V and ability to consume PO. - Will provide diet education prior to d/c.  NUTRITION DIAGNOSIS:   Inadequate oral intake related to inability to eat, acute illness, nausea, vomiting as evidenced by per patient/family report.  GOAL:   Patient will meet greater than or equal to 90% of their needs  MONITOR:   PO intake, Diet advancement, Weight trends, Labs, Skin, I & O's  REASON FOR ASSESSMENT:   Malnutrition Screening Tool  ASSESSMENT:   45 y.o. male with medical history significant of diabetes mellitus type 2 with history of recurrent nausea vomiting presents to the ER because of persistent vomiting over the last 3-4 days. Denies any abdominal pain. Patient had one episode of diarrhea yesterday and one large bowel movement which was loose in the ER. Denies any recent use of antibiotics. Patient was admitted last month for ophthalmologic [facility] emergency another time patient had nausea vomiting. Denies any eye pain or headache at this time. Patient states since patient had multiple episodes of nausea vomiting has not taken his insulin 3-4 days. Acute abdominal series is unremarkable in the abdomen appears benign. Patient's lab reveal metabolic acidosis with urine showing ketosis and elevated glucose levels. Patient was started on IV fluid bolus and IV insulin infusion.   Pt seen for MST. BMI indicates normal weight. Pt on FLD but reports inability to eat this AM. Most of the information is provided by sister, who is at bedside. PTA pt was experiencing N/V for 1 week with decreasing PO intake; shortly before admission pt was unable to keep even water or ice chips down. Pt has had "cycles" similar to this one since December with increasing severity. Family reports that pt  begins to feel nauseated and is unable to eat so PCP recommends not taking insulin which causes hyperglycemia which then exacerbates N/V leading to even further decline inability to consume/keep down PO.   Sister reports that pt also has acid reflux and confusion with diet related to that in addition to DM diet; informed pt and family that education will be provided prior to d/c. Pt typically becomes severely dehydrated with these "cycles" and has tried items such as Pedialyte in the past but does not like them.   No muscle or fat wasting noted at this time. Sister reports that pt has lost 30 lbs in the past 1 month which is not consistent with weights recorded in the chart. Per review, pt has lost 10 lbs (5% body weight) in the past 1 month which is significant for time frame; this weight loss plus decreased intakes in the week PTA classify pt for malnutrition as outlined above.  Not meeting needs. Medications reviewed; insulin drip now off, 10 mg IV Reglan every 8 hours, 4 runs KCl today. Labs reviewed; CBGs: 128-207 mg/dL today, Na: 960 mmol/L, K: 3.2 mmol/L, Ca: 8.7 mg/dL.     Diet Order:  Diet Carb Modified Fluid consistency:: Thin; Room service appropriate?: Yes  Skin:  Reviewed, no issues  Last BM:  6/9  Height:   Ht Readings from Last 1 Encounters:  05/21/16  (1.88 m)    Weight:   Wt Readings from Last 1 Encounters:  05/21/16 185 lb 10 oz (84.2 kg)    Ideal Body Weight:  86.36 kg (kg)  BMI:  Body mass index  is 23.82 kg/(m^2).  Estimated Nutritional Needs:   Kcal:  2100-2300  Protein:  85-100 grams  Fluid:  >/= 2 L/day  EDUCATION NEEDS:   Education needs no appropriate at this time     Trenton GammonJessica Alireza Pollack, RD, LDN Inpatient Clinical Dietitian Pager # [phone] After hours/weekend pager # [phone]

## 2016-05-21 NOTE — Progress Notes (Signed)
Inpatient Diabetes Program Recommendations  AACE/ADA: New Consensus Statement on Inpatient Glycemic Control (2015)  Target Ranges:  Prepandial:   less than 140 mg/dL      Peak postprandial:   less than 180 mg/dL (1-2 hours)      Critically ill patients:  140 - 180 mg/dL   Lab Results  Component Value Date   GLUCAP 138* 05/21/2016   HGBA1C 8.1* 04/23/2016   Results for Katha CabalKNIGHT, Reuven T (MRN [id number]) as of 05/21/2016 16:17  Ref. Range 05/21/2016 03:45 05/21/2016 04:48 05/21/2016 05:46 05/21/2016 06:43 05/21/2016 11:37  Glucose-Capillary Latest Ref Range: 65-99 mg/dL 086204 (H) 578142 (H) 469128 (H) 139 (H) 138 (H)   Review of Glycemic Control  Diabetes history: DM2 Outpatient Diabetes medications: Levemir 20 units QHS, Novolog 0-15 units qid, metformin 500 mg bid Current orders for Inpatient glycemic control: Levemir 20 units QD, Novolog moderate tidwc and hs  Inpatient Diabetes Program Recommendations:     Long discussion with pt and sister regarding gastroparesis and insulin administration. Pt states NP instructed pt to not take his home insulin if he was nauseated and vomiting. Explained to patient that he should always take his basal insulin and take his Novolog after he eats with his gastroparesis. If he is nauseated and vomiting, instructed to take basal insulin and do not take his meal coverage insulin until he can take po's. Also instructed to check blood sugars more frequently when feeling sick. Interested in obtaining PCP and possibly, endocrinologist to manage his diabetes.  Discussed HgbA1C of 8.1%.  Agree with orders. Will place order for OP Diabetes Education consult for uncontrolled DM. Pt and sister voiced understanding and expect compliance.  Thank you. Ailene Ardshonda Sayda Grable, RD, LDN, CDE Inpatient Diabetes Coordinator [phone] Inpatient Diabetes Coordinator [phone]

## 2016-05-22 DIAGNOSIS — E559 Vitamin D deficiency, unspecified: Secondary | ICD-10-CM | POA: Diagnosis present

## 2016-05-22 DIAGNOSIS — Z79899 Other long term (current) drug therapy: Secondary | ICD-10-CM | POA: Diagnosis not present

## 2016-05-22 DIAGNOSIS — R197 Diarrhea, unspecified: Secondary | ICD-10-CM | POA: Diagnosis present

## 2016-05-22 DIAGNOSIS — E1143 Type 2 diabetes mellitus with diabetic autonomic (poly)neuropathy: Secondary | ICD-10-CM | POA: Diagnosis present

## 2016-05-22 DIAGNOSIS — R1110 Vomiting, unspecified: Secondary | ICD-10-CM | POA: Diagnosis present

## 2016-05-22 DIAGNOSIS — Z6823 Body mass index (BMI) 23.0-23.9, adult: Secondary | ICD-10-CM | POA: Diagnosis not present

## 2016-05-22 DIAGNOSIS — M810 Age-related osteoporosis without current pathological fracture: Secondary | ICD-10-CM | POA: Diagnosis present

## 2016-05-22 DIAGNOSIS — E876 Hypokalemia: Secondary | ICD-10-CM | POA: Diagnosis present

## 2016-05-22 DIAGNOSIS — E0810 Diabetes mellitus due to underlying condition with ketoacidosis without coma: Secondary | ICD-10-CM | POA: Diagnosis not present

## 2016-05-22 DIAGNOSIS — K219 Gastro-esophageal reflux disease without esophagitis: Secondary | ICD-10-CM | POA: Diagnosis present

## 2016-05-22 DIAGNOSIS — E1142 Type 2 diabetes mellitus with diabetic polyneuropathy: Secondary | ICD-10-CM | POA: Diagnosis present

## 2016-05-22 DIAGNOSIS — E1310 Other specified diabetes mellitus with ketoacidosis without coma: Secondary | ICD-10-CM | POA: Diagnosis present

## 2016-05-22 DIAGNOSIS — Z794 Long term (current) use of insulin: Secondary | ICD-10-CM | POA: Diagnosis not present

## 2016-05-22 DIAGNOSIS — I951 Orthostatic hypotension: Secondary | ICD-10-CM | POA: Diagnosis present

## 2016-05-22 DIAGNOSIS — Z833 Family history of diabetes mellitus: Secondary | ICD-10-CM | POA: Diagnosis not present

## 2016-05-22 DIAGNOSIS — E440 Moderate protein-calorie malnutrition: Secondary | ICD-10-CM | POA: Diagnosis present

## 2016-05-22 DIAGNOSIS — K3184 Gastroparesis: Secondary | ICD-10-CM | POA: Diagnosis present

## 2016-05-22 DIAGNOSIS — Z7984 Long term (current) use of oral hypoglycemic drugs: Secondary | ICD-10-CM | POA: Diagnosis not present

## 2016-05-22 DIAGNOSIS — Z89422 Acquired absence of other left toe(s): Secondary | ICD-10-CM | POA: Diagnosis not present

## 2016-05-22 DIAGNOSIS — Z809 Family history of malignant neoplasm, unspecified: Secondary | ICD-10-CM | POA: Diagnosis not present

## 2016-05-22 LAB — GLUCOSE, CAPILLARY
GLUCOSE-CAPILLARY: 145 mg/dL — AB (ref 65–99)
Glucose-Capillary: 112 mg/dL — ABNORMAL HIGH (ref 65–99)
Glucose-Capillary: 167 mg/dL — ABNORMAL HIGH (ref 65–99)
Glucose-Capillary: 143 mg/dL — ABNORMAL HIGH (ref 65–99)

## 2016-05-22 LAB — CBC
HEMATOCRIT: 30.8 % — AB (ref 39.0–52.0)
HEMOGLOBIN: 11.2 g/dL — AB (ref 13.0–17.0)
MCH: 29.3 pg (ref 26.0–34.0)
MCHC: 36.4 g/dL — AB (ref 30.0–36.0)
MCV: 80.6 fL (ref 78.0–100.0)
Platelets: 266 10*3/uL (ref 150–400)
RBC: 3.82 MIL/uL — AB (ref 4.22–5.81)
RDW: 13.5 % (ref 11.5–15.5)
WBC: 7.5 10*3/uL (ref 4.0–10.5)

## 2016-05-22 LAB — BASIC METABOLIC PANEL
ANION GAP: 8 (ref 5–15)
BUN: 9 mg/dL (ref 6–20)
CHLORIDE: 106 mmol/L (ref 101–111)
CO2: 20 mmol/L — AB (ref 22–32)
Calcium: 8.6 mg/dL — ABNORMAL LOW (ref 8.9–10.3)
Creatinine, Ser: 0.72 mg/dL (ref 0.61–1.24)
GFR calc non Af Amer: >60 mL/min (ref >60)
GLUCOSE: 115 mg/dL — AB (ref 65–99)
POTASSIUM: 2.9 mmol/L — AB (ref 3.5–5.1)
Sodium: 134 mmol/L — ABNORMAL LOW (ref 135–145)

## 2016-05-22 MED ORDER — POTASSIUM CHLORIDE CRYS ER 20 MEQ PO TBCR: 40.0000 meq | EXTENDED_RELEASE_TABLET | Freq: Four times a day (QID) | ORAL | Status: DC

## 2016-05-22 MED ORDER — POTASSIUM CHLORIDE CRYS ER 20 MEQ PO TBCR
40.0000 meq | EXTENDED_RELEASE_TABLET | ORAL | Status: DC
  Administered 2016-05-22 – 2016-05-23 (×6): 40 meq via ORAL
  Filled 2016-05-22 (×6): qty 2

## 2016-05-22 NOTE — Progress Notes (Signed)
PROGRESS NOTE    Travis [name]  RUE:[id number] DOB: [birth year] DOA: 05/20/2016 PCP: [name], FNP    Brief Narrative: Travis [name] is a 45 year old gentleman with a past medical history of insulin dependent diabetes mellitus, admitted to medicine service on 05/21/2016 when he presented with complaints of intractable nausea and vomiting. He has had recent emergency room visits for intractable nausea and vomiting. Initial blood work in the emergency room revealed an anion gap of 16 with a bicarbonate of 16. His blood sugar was 234. Mild diabetic ketoacidosis was suspected and a contribute factor to his nausea and vomiting. He was placed on IV insulin and admitted to the stepdown unit.    Assessment & Plan:   Principal Problem:   Nausea & vomiting Active Problems:   DKA (diabetic ketoacidoses) (HCC)   Ketoacidosis   Gastroparesis due to DM (HCC)   Malnutrition of moderate degree  1.  Diabetic ketoacidosis -Travis [name] is a 45 year old gentleman presenting overnight to the emergency department with intractable nausea and vomiting. Initial blood work showing an elevated and a gap of 16 with bicarbonate of 16. Urinalysis showed the presence of ketones. -He was placed on the DKA protocol and treated with IV insulin and IV fluids -He was advised to not stop his basal insulin and if there were questions/concerns about his insulin regimen he should discuss this further with his PCP.  -Diabetic coordinator consulted -Blood sugars controlled, will continue Levemir 20 units subcutaneous daily. -Plan to transfer patient out of the stepdown unit today.  2.  Intractable nausea and vomiting -He presents with diabetic ketoacidosis although I think that diabetic gastroparesis is a contributing factor. -Will schedule Reglan 10 mg IV 3 times a day today -Showing improvement, seems to be tolerating by mouth intake  3.  Hypokalemia. -Likely related to diabetic ketoacidosis and GI losses from nausea  and vomiting -A.m. lab work showing potassium of 2.9, all replace with oral potassium today   DVT prophylaxis: Lovenox Code Status: Full code Family Communication: Disposition Plan: We'll transfer out of stepdown unit, anticipate discharge in the next 24 hours  Subjective: States having single episode of emesis yesterday evening otherwise thinks is doing fairly well  Objective: Filed Vitals:   05/21/16 2000 05/21/16 2005 05/22/16 0025 05/22/16 0400  BP:  161/81 160/82   Pulse: 81 78 93 81  Temp: 97.9 F (36.6 C)  97.8 F (36.6 C) 97.5 F (36.4 C)  TempSrc: Oral  Oral Oral  Resp: 22 16 19 14   Height:      Weight:      SpO2: 98% 97% 98% 98%    Intake/Output Summary (Last 24 hours) at 05/22/16 0756 Last data filed at 05/22/16 0600  Gross per 24 hour  Intake 3030.33 ml  Output   3200 ml  Net -169.67 ml   Filed Weights   05/21/16 0232  Weight: 84.2 kg (185 lb 10 oz)    Examination:  General exam: Appears calm and comfortable, Nontoxic-appearing Respiratory system: Clear to auscultation. Respiratory effort normal. Cardiovascular system: S1 & S2 heard, RRR. No JVD, murmurs, rubs, gallops or clicks. No pedal edema. Gastrointestinal system: He has mild generalized tenderness to palpation across abdomen, no rebound tenderness or guarding Central nervous system: Alert and oriented. No focal neurological deficits. Extremities: Symmetric 5 x 5 power. Status post fourth toe amputation 2 left  Skin: No rashes, lesions or ulcers Psychiatry: Judgement and insight appear normal. Mood & affect appropriate.     Data Reviewed: I  have personally reviewed following labs and imaging studies  CBC:  Recent Labs Lab 05/20/16 2312 05/21/16 0311 05/22/16 0305  WBC 12.6* 9.7 7.5  NEUTROABS 9.0*  --   --   HGB 13.3 11.8* 11.2*  HCT 36.1* 31.9* 30.8*  MCV 80.2 82.0 80.6  PLT 359 312 266   Basic Metabolic Panel:  Recent Labs Lab 05/20/16 2312 05/21/16 0311 05/21/16 0709  05/21/16 1103 05/22/16 0305  NA 131* 133* 132* 131* 134*  K 3.2* 2.9* 3.2* 3.2* 2.9*  CL 99* 104 104 103 106  CO2 16* 19* 19* 18* 20*  GLUCOSE 234* 211* 153* 137* 115*  BUN 19 19 16 14 9   CREATININE 0.83 0.75 0.66 0.67 0.72  CALCIUM 9.4 8.8* 8.7* 8.8* 8.6*  MG  --   --  1.8  --   --    GFR: Estimated Creatinine Clearance: 137 mL/min (by C-G formula based on Cr of 0.72). Liver Function Tests:  Recent Labs Lab 05/20/16 2312  AST 19  ALT 22  ALKPHOS 50  BILITOT 3.3*  PROT 7.6  ALBUMIN 4.7   No results for input(s): LIPASE, AMYLASE in the last 168 hours. No results for input(s): AMMONIA in the last 168 hours. Coagulation Profile: No results for input(s): INR, PROTIME in the last 168 hours. Cardiac Enzymes:  Recent Labs Lab 05/21/16 0311  TROPONINI <0.03   BNP (last 3 results) No results for input(s): PROBNP in the last 8760 hours. HbA1C: No results for input(s): HGBA1C in the last 72 hours. CBG:  Recent Labs Lab 05/21/16 0643 05/21/16 0759 05/21/16 1137 05/21/16 1712 05/21/16 2122  GLUCAP 139* 167* 138* 137* 120*   Lipid Profile: No results for input(s): CHOL, HDL, LDLCALC, TRIG, CHOLHDL, LDLDIRECT in the last 72 hours. Thyroid Function Tests: No results for input(s): TSH, T4TOTAL, FREET4, T3FREE, THYROIDAB in the last 72 hours. Anemia Panel: No results for input(s): VITAMINB12, FOLATE, FERRITIN, TIBC, IRON, RETICCTPCT in the last 72 hours. Sepsis Labs: No results for input(s): PROCALCITON, LATICACIDVEN in the last 168 hours.  Recent Results (from the past 240 hour(s))  MRSA PCR Screening     Status: None   Collection Time: 05/21/16  2:33 AM  Result Value Ref Range Status   MRSA by PCR NEGATIVE NEGATIVE Final    Comment:        The GeneXpert MRSA Assay (FDA approved for NASAL specimens only), is one component of a comprehensive MRSA colonization surveillance program. It is not intended to diagnose MRSA infection nor to guide or monitor treatment  for MRSA infections.          Radiology Studies: Dg Abd 2 Views  05/21/2016  CLINICAL DATA:  Vomiting. EXAM: ABDOMEN - 2 VIEW COMPARISON:  None. FINDINGS: The bowel gas pattern is normal. There is no evidence of free air. No radio-opaque calculi or other significant radiographic abnormality is seen. Lung bases are clear. IMPRESSION: Negative. Electronically Signed   By: Marnee SpringJonathon  Watts M.D.   On: 05/21/2016 02:00        Scheduled Meds: . enoxaparin (LOVENOX) injection  40 mg Subcutaneous Q24H  . famotidine (PEPCID) IV  20 mg Intravenous Q12H  . insulin aspart  0-15 Units Subcutaneous TID WC  . insulin aspart  0-5 Units Subcutaneous QHS  . insulin detemir  20 Units Subcutaneous Daily  . metoCLOPramide (REGLAN) injection  10 mg Intravenous Q8H  . potassium chloride  40 mEq Oral Q4H   Continuous Infusions:     LOS: 1 day  Time spent: 25 min    Jeralyn Bennett, MD [facility] Pager [phone]  If 7PM-7AM, please contact night-coverage [email or web address] Password [facility] 05/22/2016, 7:56 AM

## 2016-05-23 LAB — BASIC METABOLIC PANEL
Anion gap: 7 (ref 5–15)
BUN: 9 mg/dL (ref 6–20)
CALCIUM: 9 mg/dL (ref 8.9–10.3)
CO2: 22 mmol/L (ref 22–32)
CREATININE: 0.65 mg/dL (ref 0.61–1.24)
Chloride: 103 mmol/L (ref 101–111)
GFR calc non Af Amer: >60 mL/min (ref >60)
GLUCOSE: 148 mg/dL — AB (ref 65–99)
Potassium: 4.2 mmol/L (ref 3.5–5.1)
Sodium: 132 mmol/L — ABNORMAL LOW (ref 135–145)

## 2016-05-23 LAB — GLUCOSE, CAPILLARY: Glucose-Capillary: 109 mg/dL — ABNORMAL HIGH (ref 65–99)

## 2016-05-23 MED ORDER — METOCLOPRAMIDE HCL 10 MG PO TABS: 10.0000 mg | ORAL_TABLET | Freq: Three times a day (TID) | ORAL | Status: DC

## 2016-05-23 NOTE — Discharge Summary (Signed)
Physician Discharge Summary  Travis [name] ZOX:[id number] DOB: 1971-11-07 DOA: 05/20/2016  PCP: [name], FNP  Admit date: 05/20/2016 Discharge date: 05/23/2016  Time spent: 35 minutes  Recommendations for Outpatient Follow-up:  1. Please follow-up on blood sugars, he was admitted for diabetic ketoacidosis 2. Started on Reglan during this hospitalization for suspected diabetic gastroparesis 3. Please follow-up on a BMP on hospital follow-up visit, he was hypokalemic while he secondary to GI losses and required just replacement during this hospitalization   Discharge Diagnoses:  Principal Problem:   Nausea & vomiting Active Problems:   DKA (diabetic ketoacidoses) (HCC)   Ketoacidosis   Gastroparesis due to DM (HCC)   Malnutrition of moderate degree   Discharge Condition: Stable  Diet recommendation: Carbohydrate modified diet  Filed Weights   05/21/16 0232 05/22/16 0915  Weight: 84.2 kg (185 lb 10 oz) 83.97 kg (185 lb 1.9 oz)    History of present illness:  Travis [name] is a 45 y.o. male with medical history significant of diabetes mellitus type 2 with history of recurrent nausea vomiting presents to the ER because of persistent vomiting over the last 3-4 days. Denies any abdominal pain. Patient had one episode of diarrhea yesterday and one large bowel movement which was loose in the ER. Denies any recent use of antibiotics. Patient was admitted last month for ophthalmologic [facility] emergency another time patient had nausea vomiting. Denies any eye pain or headache at this time. Patient states since patient had multiple episodes of nausea vomiting has not taken his insulin 3-4 days. Acute abdominal series is unremarkable in the abdomen appears benign. Patient's lab reveal metabolic acidosis with urine showing ketosis and elevated glucose levels. Patient was started on IV fluid bolus and IV insulin infusion.   Hospital Course:  Travis [name] is a 45 year old gentleman with a  past medical history of insulin dependent diabetes mellitus, admitted to medicine service on 05/21/2016 when he presented with complaints of intractable nausea and vomiting. He has had recent emergency room visits for intractable nausea and vomiting. Initial blood work in the emergency room revealed an anion gap of 16 with a bicarbonate of 16. His blood sugar was 234. Mild diabetic ketoacidosis was suspected and a contribute factor to his nausea and vomiting. He was placed on IV insulin and admitted to the stepdown unit.   1. Diabetic ketoacidosis -Travis [name] is a 45 year old gentleman presenting overnight to the emergency department with intractable nausea and vomiting. Initial blood work showing an elevated and a gap of 16 with bicarbonate of 16. Urinalysis showed the presence of ketones. -He was placed on the DKA protocol and treated with IV insulin and IV fluids -He was advised to not stop his basal insulin and if there were questions/concerns about his insulin regimen he should discuss this further with his PCP.  -Diabetic coordinator consulted -Blood sugars controlled, will continue Levemir 20 units subcutaneous daily. -He was discharged in stable condition on 05/23/2016, plans to get follow-up with [facility] primary care  2. Intractable nausea and vomiting -He presents with diabetic ketoacidosis although I think that diabetic gastroparesis is a contributing factor. -Schedule Reglan 10 mg IV 3 times a day, having subsequent improvement to his symptoms.  3. Hypokalemia. -Likely related to diabetic ketoacidosis and GI losses from nausea and vomiting -During this hospitalization he required potassium replacement -Please follow-up on a BMP on hospital follow-up visit   Consultations:  Diabetic coordinator  Discharge Exam: Filed Vitals:   05/22/16 2120 05/23/16 0537  BP:  133/80 134/85  Pulse: 86 88  Temp: 98.1 F (36.7 C) 98.3 F (36.8 C)  Resp: 17 18    General exam: Appears calm  and comfortable, Nontoxic-appearing, states feeling much better and thinks he is ready to go home today Respiratory system: Clear to auscultation. Respiratory effort normal. Cardiovascular system: S1 & S2 heard, RRR. No JVD, murmurs, rubs, gallops or clicks. No pedal edema. Gastrointestinal system: He has mild generalized tenderness to palpation across abdomen, no rebound tenderness or guarding Central nervous system: Alert and oriented. No focal neurological deficits. Extremities: Symmetric 5 x 5 power. Status post fourth toe amputation 2 left  Skin: No rashes, lesions or ulcers Psychiatry: Judgement and insight appear normal. Mood & affect appropriate.   Discharge Instructions   Discharge Instructions    Call MD for:  difficulty breathing, headache or visual disturbances    Complete by:  As directed      Call MD for:  extreme fatigue    Complete by:  As directed      Call MD for:  hives    Complete by:  As directed      Call MD for:  persistant dizziness or light-headedness    Complete by:  As directed      Call MD for:  persistant nausea and vomiting    Complete by:  As directed      Call MD for:  redness, tenderness, or signs of infection (pain, swelling, redness, odor or green/yellow discharge around incision site)    Complete by:  As directed      Call MD for:  severe uncontrolled pain    Complete by:  As directed      Call MD for:  temperature >100.4    Complete by:  As directed      Call MD for:    Complete by:  As directed      Diet - low sodium heart healthy    Complete by:  As directed      Increase activity slowly    Complete by:  As directed           Current Discharge Medication List    START taking these medications   Details  metoCLOPramide (REGLAN) 10 MG tablet Take 1 tablet (10 mg total) by mouth 4 (four) times daily -  before meals and at bedtime. Qty: 90 tablet, Refills: 1      CONTINUE these medications which have NOT CHANGED   Details   acetaZOLAMIDE (DIAMOX) 250 MG tablet Take 1 tablet (250 mg total) by mouth 2 (two) times daily. Qty: 20 tablet, Refills: 0    insulin aspart (NOVOLOG) 100 UNIT/ML FlexPen Inject 0-15 Units into the skin 4 (four) times daily -  with meals and at bedtime. Dose based on blood sugars taken before meals&bedtime. Qty: 15 mL, Refills: 3    insulin detemir (LEVEMIR) 100 UNIT/ML injection Inject 0.2 mLs (20 Units total) into the skin at bedtime. Qty: 30 mL, Refills: 3   Associated Diagnoses: Type 2 diabetes mellitus with diabetic polyneuropathy, with long-term current use of insulin (HCC)    ofloxacin (OCUFLOX) 0.3 % ophthalmic solution Place 1 drop into the left eye 4 (four) times daily. Qty: 5 mL, Refills: 0    pantoprazole (PROTONIX) 40 MG tablet Take 1 tablet (40 mg total) by mouth 2 (two) times daily before a meal. Qty: 28 tablet, Refills: 0    ranitidine (ZANTAC) 300 MG tablet Take 1 tablet (300 mg total) by mouth  at bedtime. Qty: 30 tablet, Refills: 2   Associated Diagnoses: Gastroesophageal reflux disease without esophagitis    alum & mag hydroxide-simeth (MAALOX/MYLANTA) 200-200-20 MG/5ML suspension Take 15 mLs by mouth every 4 (four) hours as needed for indigestion or heartburn. Qty: 355 mL, Refills: 0    ondansetron (ZOFRAN ODT) 4 MG disintegrating tablet Take 1 tablet (4 mg total) by mouth every 8 (eight) hours as needed for nausea or vomiting. Qty: 20 tablet, Refills: 0    promethazine (PHENERGAN) 12.5 MG tablet Take 1 tablet (12.5 mg total) by mouth every 8 (eight) hours as needed for nausea or vomiting. Qty: 30 tablet, Refills: 0   Associated Diagnoses: Non-intractable vomiting with nausea, unspecified vomiting type      STOP taking these medications     metFORMIN (GLUCOPHAGE) 500 MG tablet        No Known Allergies    The results of significant diagnostics from this hospitalization (including imaging, microbiology, ancillary and laboratory) are listed below for  reference.    Significant Diagnostic Studies: Dg Abd 2 Views  05/21/2016  CLINICAL DATA:  Vomiting. EXAM: ABDOMEN - 2 VIEW COMPARISON:  None. FINDINGS: The bowel gas pattern is normal. There is no evidence of free air. No radio-opaque calculi or other significant radiographic abnormality is seen. Lung bases are clear. IMPRESSION: Negative. Electronically Signed   By: Marnee SpringJonathon  Watts M.D.   On: 05/21/2016 02:00    Microbiology: Recent Results (from the past 240 hour(s))  MRSA PCR Screening     Status: None   Collection Time: 05/21/16  2:33 AM  Result Value Ref Range Status   MRSA by PCR NEGATIVE NEGATIVE Final    Comment:        The GeneXpert MRSA Assay (FDA approved for NASAL specimens only), is one component of a comprehensive MRSA colonization surveillance program. It is not intended to diagnose MRSA infection nor to guide or monitor treatment for MRSA infections.      Labs: Basic Metabolic Panel:  Recent Labs Lab 05/20/16 2312 05/21/16 0311 05/21/16 0709 05/21/16 1103 05/22/16 0305  NA 131* 133* 132* 131* 134*  K 3.2* 2.9* 3.2* 3.2* 2.9*  CL 99* 104 104 103 106  CO2 16* 19* 19* 18* 20*  GLUCOSE 234* 211* 153* 137* 115*  BUN 19 19 16 14 9   CREATININE 0.83 0.75 0.66 0.67 0.72  CALCIUM 9.4 8.8* 8.7* 8.8* 8.6*  MG  --   --  1.8  --   --    Liver Function Tests:  Recent Labs Lab 05/20/16 2312  AST 19  ALT 22  ALKPHOS 50  BILITOT 3.3*  PROT 7.6  ALBUMIN 4.7   No results for input(s): LIPASE, AMYLASE in the last 168 hours. No results for input(s): AMMONIA in the last 168 hours. CBC:  Recent Labs Lab 05/20/16 2312 05/21/16 0311 05/22/16 0305  WBC 12.6* 9.7 7.5  NEUTROABS 9.0*  --   --   HGB 13.3 11.8* 11.2*  HCT 36.1* 31.9* 30.8*  MCV 80.2 82.0 80.6  PLT 359 312 266   Cardiac Enzymes:  Recent Labs Lab 05/21/16 0311  TROPONINI <0.03   BNP: BNP (last 3 results) No results for input(s): BNP in the last 8760 hours.  ProBNP (last 3  results) No results for input(s): PROBNP in the last 8760 hours.  CBG:  Recent Labs Lab 05/22/16 0737 05/22/16 1135 05/22/16 1705 05/22/16 2120 05/23/16 0708  GLUCAP 112* 145* 167* 143* 109*       Signed:  Jeralyn Bennett MD.  [facility] 05/23/2016, 8:42 AM

## 2016-05-23 NOTE — Progress Notes (Signed)
Patient discharged to home with family, discharge instructions reviewed with patient who verbalized understanding. New RX given to patient. 

## 2016-05-24 ENCOUNTER — Other Ambulatory Visit (HOSPITAL_COMMUNITY): Payer: Self-pay | Admitting: Physician Assistant

## 2016-05-24 ENCOUNTER — Encounter: Payer: Self-pay | Admitting: Family Medicine

## 2016-05-24 DIAGNOSIS — K3184 Gastroparesis: Secondary | ICD-10-CM

## 2016-05-24 DIAGNOSIS — R112 Nausea with vomiting, unspecified: Secondary | ICD-10-CM

## 2016-05-24 DIAGNOSIS — R634 Abnormal weight loss: Secondary | ICD-10-CM

## 2016-05-24 LAB — GLUCOSE, CAPILLARY: GLUCOSE-CAPILLARY: 234 mg/dL — AB (ref 65–99)

## 2016-05-25 ENCOUNTER — Telehealth: Payer: Self-pay

## 2016-05-25 ENCOUNTER — Other Ambulatory Visit: Payer: Self-pay | Admitting: Family Medicine

## 2016-05-25 DIAGNOSIS — E1142 Type 2 diabetes mellitus with diabetic polyneuropathy: Secondary | ICD-10-CM

## 2016-05-25 DIAGNOSIS — E1165 Type 2 diabetes mellitus with hyperglycemia: Principal | ICD-10-CM

## 2016-05-25 NOTE — Telephone Encounter (Signed)
Sister called on behalf of patient in response to office rescheduling the patient's next appt.  She is worried that by the office rescheduling his appts, it will have a negative impact on patient's record. Sister was informed that the rescheduling of appts won't effect him negatively.

## 2016-05-25 NOTE — Telephone Encounter (Signed)
Pt is requesting a referral to Endocrinologist, preferably Dr. Lyanne CoKeur at [facility]. Thanks!

## 2016-05-25 NOTE — Telephone Encounter (Signed)
Armeniahina please advise. Thanks!

## 2016-05-28 ENCOUNTER — Ambulatory Visit: Payer: Medicaid Other | Admitting: Family Medicine

## 2016-06-01 ENCOUNTER — Ambulatory Visit: Payer: Medicaid Other | Admitting: Internal Medicine

## 2016-06-04 ENCOUNTER — Telehealth: Payer: Self-pay

## 2016-06-04 DIAGNOSIS — E1142 Type 2 diabetes mellitus with diabetic polyneuropathy: Secondary | ICD-10-CM

## 2016-06-04 DIAGNOSIS — Z794 Long term (current) use of insulin: Secondary | ICD-10-CM

## 2016-06-04 MED ORDER — INSULIN ASPART 100 UNIT/ML FLEXPEN: 0.0000 [IU] | PEN_INJECTOR | Freq: Three times a day (TID) | SUBCUTANEOUS | Status: DC

## 2016-06-04 MED ORDER — INSULIN DETEMIR 100 UNIT/ML ~~LOC~~ SOLN: 20.0000 [IU] | Freq: Every day | SUBCUTANEOUS | Status: DC

## 2016-06-04 MED ORDER — PANTOPRAZOLE SODIUM 40 MG PO TBEC: 40.0000 mg | DELAYED_RELEASE_TABLET | Freq: Two times a day (BID) | ORAL | Status: DC

## 2016-06-04 NOTE — Telephone Encounter (Signed)
Pt is requesting a medication refill on the following: Levemir, Novolog, and his acid reflux medication. He would like for it to be sent to the [facility]. Thanks!

## 2016-06-04 NOTE — Telephone Encounter (Signed)
Refills have been sent into pharmacy. Thanks!  

## 2016-06-08 ENCOUNTER — Ambulatory Visit: Payer: Medicaid Other | Admitting: Internal Medicine

## 2016-06-22 ENCOUNTER — Ambulatory Visit (HOSPITAL_COMMUNITY)
Admission: RE | Admit: 2016-06-22 | Discharge: 2016-06-22 | Disposition: A | Discharge status: Home / Self Care | Payer: Medicaid Other | Source: Ambulatory Visit | Attending: Physician Assistant | Admitting: Physician Assistant

## 2016-06-22 DIAGNOSIS — K30 Functional dyspepsia: Secondary | ICD-10-CM | POA: Diagnosis not present

## 2016-06-22 DIAGNOSIS — K219 Gastro-esophageal reflux disease without esophagitis: Secondary | ICD-10-CM | POA: Diagnosis not present

## 2016-06-22 DIAGNOSIS — K3184 Gastroparesis: Secondary | ICD-10-CM | POA: Diagnosis not present

## 2016-06-22 DIAGNOSIS — E119 Type 2 diabetes mellitus without complications: Secondary | ICD-10-CM | POA: Insufficient documentation

## 2016-06-22 DIAGNOSIS — R634 Abnormal weight loss: Secondary | ICD-10-CM

## 2016-06-22 DIAGNOSIS — R112 Nausea with vomiting, unspecified: Secondary | ICD-10-CM | POA: Diagnosis present

## 2016-06-22 MED ORDER — TECHNETIUM TC 99M SULFUR COLLOID
2.0000 | Freq: Once | INTRAVENOUS | Status: AC | PRN
  Administered 2016-06-22: 2 via INTRAVENOUS

## 2016-07-12 ENCOUNTER — Other Ambulatory Visit (HOSPITAL_COMMUNITY): Payer: Self-pay | Admitting: Family Medicine

## 2016-07-12 DIAGNOSIS — R0602 Shortness of breath: Secondary | ICD-10-CM

## 2016-07-22 ENCOUNTER — Encounter (HOSPITAL_COMMUNITY): Payer: No Typology Code available for payment source

## 2016-07-22 ENCOUNTER — Other Ambulatory Visit (HOSPITAL_COMMUNITY): Payer: Medicaid Other

## 2016-07-28 ENCOUNTER — Encounter (HOSPITAL_COMMUNITY): Payer: Self-pay

## 2016-07-29 ENCOUNTER — Ambulatory Visit (HOSPITAL_COMMUNITY)
Admission: RE | Admit: 2016-07-29 | Discharge: 2016-07-29 | Disposition: A | Discharge status: Home / Self Care | Payer: Medicaid Other | Source: Ambulatory Visit | Attending: Family Medicine | Admitting: Family Medicine

## 2016-07-29 ENCOUNTER — Encounter (HOSPITAL_BASED_OUTPATIENT_CLINIC_OR_DEPARTMENT_OTHER)
Admission: RE | Admit: 2016-07-29 | Discharge: 2016-07-29 | Disposition: A | Discharge status: Home / Self Care | Payer: Medicaid Other | Source: Ambulatory Visit | Attending: Family Medicine | Admitting: Family Medicine

## 2016-07-29 DIAGNOSIS — R0602 Shortness of breath: Secondary | ICD-10-CM | POA: Insufficient documentation

## 2016-07-29 DIAGNOSIS — R9439 Abnormal result of other cardiovascular function study: Secondary | ICD-10-CM | POA: Diagnosis not present

## 2016-07-29 MED ORDER — REGADENOSON 0.4 MG/5ML IV SOLN
INTRAVENOUS | Status: AC
  Administered 2016-07-29: 0.4 mg
  Filled 2016-07-29: qty 5

## 2016-07-29 MED ORDER — TECHNETIUM TC 99M TETROFOSMIN IV KIT
10.0000 | PACK | Freq: Once | INTRAVENOUS | Status: AC | PRN
  Administered 2016-07-29: 10 via INTRAVENOUS

## 2016-07-29 MED ORDER — TECHNETIUM TC 99M TETROFOSMIN IV KIT
30.0000 | PACK | Freq: Once | INTRAVENOUS | Status: AC | PRN
  Administered 2016-07-29: 30 via INTRAVENOUS

## 2016-08-09 ENCOUNTER — Encounter: Payer: Self-pay | Admitting: Physician Assistant

## 2016-08-09 LAB — NM MYOCAR MULTI W/SPECT W/WALL MOTION / EF
CHL CUP NUCLEAR SRS: 16
CHL CUP NUCLEAR SSS: 30
CHL CUP STRESS STAGE 1 GRADE: 0 %
CHL CUP STRESS STAGE 1 SBP: 125 mmHg
CHL CUP STRESS STAGE 1 SPEED: 0 mph
CHL CUP STRESS STAGE 2 GRADE: 0 %
CHL CUP STRESS STAGE 2 SPEED: 0 mph
CHL CUP STRESS STAGE 3 DBP: 68 mmHg
CHL CUP STRESS STAGE 3 GRADE: 0 %
CHL CUP STRESS STAGE 3 HR: 91 {beats}/min
CHL CUP STRESS STAGE 4 DBP: 69 mmHg
CHL CUP STRESS STAGE 4 HR: 86 {beats}/min
CHL CUP STRESS STAGE 4 SBP: 126 mmHg
CSEPEW: 1 METS
CSEPPMHR: 49 %
LV dias vol: 164 mL (ref 62–150)
LV sys vol: 122 mL
NUC STRESS TID: 1.38
Peak BP: 126 mmHg
Peak HR: 86 {beats}/min
RATE: 0.3
Rest HR: 78 {beats}/min
SDS: 14
Stage 1 DBP: 69 mmHg
Stage 1 HR: 75 {beats}/min
Stage 2 HR: 75 {beats}/min
Stage 3 SBP: 123 mmHg
Stage 3 Speed: 0 mph
Stage 4 Grade: 0 %
Stage 4 Speed: 0 mph

## 2016-08-09 NOTE — Progress Notes (Signed)
I had a reminder placed to myself to f/u on the nuc result for this patient since we were not the ordering provider, to make sure the results were appropriately routed to his PCP. It appears there is an issue with the nuc result crossing over. D/w nuc med and Dr. Anne FuSkains - nuc med will be working on loading the images soon for Dr. Anne FuSkains' review. Will continue to f/u result. Kaleigha Chamberlin PA-C

## 2016-08-10 ENCOUNTER — Telehealth: Payer: Self-pay | Admitting: Physician Assistant

## 2016-08-10 NOTE — Telephone Encounter (Signed)
Hi [name], I need some help. A bit of an unusual situation. I proctored this patient's tress test on my last call day in the hospital. We actually have never seen the patient before - it was ordered as outpatient by Dr. Hanley Haysosario at [facility] but was done at the hospital for unclear reason, maybe availability. I had spoken with Ward Givenshris Berge to make sure we were OK to proctor - he said we do this as a courtesy for their clinic, but ultimately the result is the ordering provider's responsibility to further act on. There were apparent issues with the processing of the test so it was read by Dr. Anne FuSkains yesterday. I don't think Dr. Hanley Haysosario is in Epic, and I cannot be sure that hospital nuc results get automatically faxed to the referring provider. (This was an outpatient, not an inpatient.) Can you make sure Dr. Hanley Haysosario gets a copy of this stress test to decide what further workup the patient needs? Thanks. Dayna Dunn PA-C

## 2016-08-17 ENCOUNTER — Encounter (HOSPITAL_BASED_OUTPATIENT_CLINIC_OR_DEPARTMENT_OTHER): Payer: Medicaid Other | Attending: Surgery

## 2016-08-17 ENCOUNTER — Ambulatory Visit (HOSPITAL_COMMUNITY)
Admission: RE | Admit: 2016-08-17 | Discharge: 2016-08-17 | Disposition: A | Discharge status: Home / Self Care | Payer: Medicaid Other | Source: Ambulatory Visit | Attending: Surgery | Admitting: Surgery

## 2016-08-17 ENCOUNTER — Other Ambulatory Visit: Payer: Self-pay | Admitting: Surgery

## 2016-08-17 DIAGNOSIS — Z89422 Acquired absence of other left toe(s): Secondary | ICD-10-CM | POA: Diagnosis not present

## 2016-08-17 DIAGNOSIS — L97521 Non-pressure chronic ulcer of other part of left foot limited to breakdown of skin: Secondary | ICD-10-CM | POA: Diagnosis not present

## 2016-08-17 DIAGNOSIS — E1140 Type 2 diabetes mellitus with diabetic neuropathy, unspecified: Secondary | ICD-10-CM | POA: Diagnosis not present

## 2016-08-17 DIAGNOSIS — X58XXXA Exposure to other specified factors, initial encounter: Secondary | ICD-10-CM | POA: Insufficient documentation

## 2016-08-17 DIAGNOSIS — E11621 Type 2 diabetes mellitus with foot ulcer: Secondary | ICD-10-CM | POA: Insufficient documentation

## 2016-08-17 DIAGNOSIS — L97522 Non-pressure chronic ulcer of other part of left foot with fat layer exposed: Secondary | ICD-10-CM | POA: Insufficient documentation

## 2016-08-17 DIAGNOSIS — Z9889 Other specified postprocedural states: Secondary | ICD-10-CM | POA: Insufficient documentation

## 2016-08-17 DIAGNOSIS — M869 Osteomyelitis, unspecified: Secondary | ICD-10-CM

## 2016-08-17 DIAGNOSIS — S80812A Abrasion, left lower leg, initial encounter: Secondary | ICD-10-CM | POA: Insufficient documentation

## 2016-08-18 NOTE — Telephone Encounter (Signed)
I contacted [facility] and sent the results of pt stress test so they can identify the treatment plan for pt and contact him, per Ronie Spiesayna Dunn, PA-C.

## 2016-08-24 DIAGNOSIS — E11621 Type 2 diabetes mellitus with foot ulcer: Secondary | ICD-10-CM | POA: Diagnosis not present

## 2016-08-27 DIAGNOSIS — E11621 Type 2 diabetes mellitus with foot ulcer: Secondary | ICD-10-CM | POA: Diagnosis not present

## 2016-09-01 DIAGNOSIS — E11621 Type 2 diabetes mellitus with foot ulcer: Secondary | ICD-10-CM | POA: Diagnosis not present

## 2016-09-08 DIAGNOSIS — E11621 Type 2 diabetes mellitus with foot ulcer: Secondary | ICD-10-CM | POA: Diagnosis not present

## 2016-09-14 ENCOUNTER — Encounter (HOSPITAL_BASED_OUTPATIENT_CLINIC_OR_DEPARTMENT_OTHER): Payer: Medicaid Other | Attending: Internal Medicine

## 2016-09-14 DIAGNOSIS — L97425 Non-pressure chronic ulcer of left heel and midfoot with muscle involvement without evidence of necrosis: Secondary | ICD-10-CM | POA: Insufficient documentation

## 2016-09-14 DIAGNOSIS — L97821 Non-pressure chronic ulcer of other part of left lower leg limited to breakdown of skin: Secondary | ICD-10-CM | POA: Insufficient documentation

## 2016-09-14 DIAGNOSIS — E11621 Type 2 diabetes mellitus with foot ulcer: Secondary | ICD-10-CM | POA: Insufficient documentation

## 2016-09-14 DIAGNOSIS — E11622 Type 2 diabetes mellitus with other skin ulcer: Secondary | ICD-10-CM | POA: Insufficient documentation

## 2016-09-14 DIAGNOSIS — Z89422 Acquired absence of other left toe(s): Secondary | ICD-10-CM | POA: Insufficient documentation

## 2016-09-14 DIAGNOSIS — E1142 Type 2 diabetes mellitus with diabetic polyneuropathy: Secondary | ICD-10-CM | POA: Insufficient documentation

## 2016-09-14 DIAGNOSIS — M86372 Chronic multifocal osteomyelitis, left ankle and foot: Secondary | ICD-10-CM | POA: Insufficient documentation

## 2016-09-21 DIAGNOSIS — E11621 Type 2 diabetes mellitus with foot ulcer: Secondary | ICD-10-CM | POA: Diagnosis not present

## 2016-09-28 ENCOUNTER — Other Ambulatory Visit: Payer: Self-pay | Admitting: Surgery

## 2016-09-28 ENCOUNTER — Ambulatory Visit (HOSPITAL_COMMUNITY)
Admission: RE | Admit: 2016-09-28 | Discharge: 2016-09-28 | Disposition: A | Discharge status: Home / Self Care | Payer: Medicaid Other | Source: Ambulatory Visit | Attending: Surgery | Admitting: Surgery

## 2016-09-28 DIAGNOSIS — M86172 Other acute osteomyelitis, left ankle and foot: Secondary | ICD-10-CM

## 2016-09-28 DIAGNOSIS — L97522 Non-pressure chronic ulcer of other part of left foot with fat layer exposed: Secondary | ICD-10-CM | POA: Insufficient documentation

## 2016-09-28 DIAGNOSIS — E11621 Type 2 diabetes mellitus with foot ulcer: Secondary | ICD-10-CM | POA: Diagnosis not present

## 2016-10-04 ENCOUNTER — Other Ambulatory Visit: Payer: Self-pay | Admitting: Surgery

## 2016-10-04 DIAGNOSIS — E11621 Type 2 diabetes mellitus with foot ulcer: Secondary | ICD-10-CM

## 2016-10-04 DIAGNOSIS — L97509 Non-pressure chronic ulcer of other part of unspecified foot with unspecified severity: Secondary | ICD-10-CM

## 2016-10-04 DIAGNOSIS — L97522 Non-pressure chronic ulcer of other part of left foot with fat layer exposed: Secondary | ICD-10-CM

## 2016-10-05 ENCOUNTER — Other Ambulatory Visit: Payer: Self-pay | Admitting: Certified Registered Nurse Anesthetist

## 2016-10-05 DIAGNOSIS — E11621 Type 2 diabetes mellitus with foot ulcer: Secondary | ICD-10-CM | POA: Diagnosis not present

## 2016-10-06 ENCOUNTER — Ambulatory Visit (INDEPENDENT_AMBULATORY_CARE_PROVIDER_SITE_OTHER): Payer: Medicaid Other | Admitting: Neurology

## 2016-10-06 ENCOUNTER — Encounter: Payer: Self-pay | Admitting: Neurology

## 2016-10-06 VITALS — BP 112/61 | HR 90 | Ht 74.0 in | Wt 217.0 lb

## 2016-10-06 DIAGNOSIS — R269 Unspecified abnormalities of gait and mobility: Secondary | ICD-10-CM | POA: Diagnosis not present

## 2016-10-06 DIAGNOSIS — E1142 Type 2 diabetes mellitus with diabetic polyneuropathy: Secondary | ICD-10-CM

## 2016-10-06 DIAGNOSIS — G629 Polyneuropathy, unspecified: Secondary | ICD-10-CM

## 2016-10-06 MED ORDER — GABAPENTIN 300 MG PO CAPS: 900.0000 mg | ORAL_CAPSULE | Freq: Every day | ORAL | 11 refills | Status: DC

## 2016-10-06 NOTE — Progress Notes (Signed)
PATIENT: Travis CabalMichael T [name] DOB: 09/22/1971  Chief Complaint  Patient presents with  . Abnormal Gait    He is here with his sister, Jacki ConesLaurie, to discuss his worsening weakness, balance and gait.  . Peripheral Neuropathy    His pain is severe, especially in his feet and legs.  He has been on gabapentin and Lyica in the past without improvment.  Duloxetine provided him slight relief.     HISTORICAL  Travis [name] is a 45 years old right-handed male, accompanied by his sister Jacki ConesLaurie, seen in refer by his primary care physician Dr. Salli RealYun Sun for evaluation of severe peripheral neuropathy, initial evaluation was October 06 2016.  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  Around 2009, concurrent with his poorly controlled diabetes, he began to notice bilateral feet paresthesia, starting at the bottom of his feet, over 2 years span, ascending to top of his foot, ankle, distal leg, around 2012, he also noticed bilateral fingertips involvement, numbness tingling shooting pain, for a while, he reported A1c was 11.  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  He is now on regular insulin treatment, diabetes was under much better control, A1c in June 2017 that was 8, sister reported most recent A1c was 6.8.  He now has constant bilateral upper and lower extremity neuropathic pain, numb tingly burning deep achy pain, since 2014, he also noticed gradual onset balance issues, he could not feel the ground, has weak ankles, gradually getting worse. He also has lost dextrality of bilateral fingers, could  not button his shirt, difficulty opening up a package.  He denies significant neck low back pain, no bowel bladder incontinence.  I reviewed laboratory evaluation in June 2017, glucose 148, creatinine 0.65, CBC showed mild anemia hemoglobin of 11.2, negative troponin, UDS was positive for marijuana, A1c in May 2017 was 8.1,  Review of System: review of systems performed and notable only for weight loss, fatigue, swelling legs, blurred vision, cough, snoring, constipation, easy bleeding, allergies, frequent infection, memory loss, confusion, numbness, weakness, dizziness, insomnia, sleepiness, snoring, restless leg, depression anxiety, not enough sleep, decreased energy, disinterested in activities  ALLERGIES: No Known Allergies  HOME MEDICATIONS: Current Outpatient Prescriptions  Medication Sig Dispense Refill  . alum & mag hydroxide-simeth (MAALOX/MYLANTA) 200-200-20 MG/5ML suspension Take 15 mLs by mouth every 4 (four) hours as needed for indigestion or heartburn. 355 mL 0  . ciprofloxacin (CIPRO) 500 MG tablet TK 1 T PO BID  0  . insulin aspart (NOVOLOG) 100 UNIT/ML FlexPen Inject 0-15 Units into the skin 4 (four) times daily -  with meals and at bedtime. Dose based on blood sugars taken before meals&bedtime. 15 mL 3  . insulin detemir (LEVEMIR) 100 UNIT/ML injection Inject 0.2 mLs (20 Units total) into the skin at bedtime. 30 mL 3  . metoCLOPramide (REGLAN) 10 MG tablet Take 1 tablet (10 mg total) by mouth 4 (four) times daily -  before meals and at bedtime. 90 tablet 1  . ofloxacin (OCUFLOX) 0.3 % ophthalmic solution Place 1 drop into the left eye 4 (four) times daily. 5 mL 0   No current facility-administered  medications for this visit.     PAST MEDICAL HISTORY: Past Medical History:  Diagnosis Date  . Acute osteomyelitis of metatarsal bone of left foot (HCC) 11/12/2015  . Closed fracture of right tibial plateau 11/11/2015  . Depression with anxiety   . Diabetes mellitus     INSULIN DEPENDENT Type II  . Diabetic peripheral neuropathy associated with type 2 diabetes mellitus (HCC) 08/30/2008   Qualifier: Diagnosis of  By: Daphine Deutscher FNP, Zena Amos    . Diabetic ulcer of left foot (HCC) 11/14/2015  . Gastroparesis   . GERD (gastroesophageal reflux disease)   . Osteoporosis 11/12/2015  . Peripheral neuropathy (HCC)   . Shortness of breath dyspnea    with exertion  . Vitamin D deficiency 11/12/2015    PAST SURGICAL HISTORY: Past Surgical History:  Procedure Laterality Date  . AMPUTATION Left 11/28/2015   Procedure: Left 4th Ray Amputation;  Surgeon: Nadara Mustard, MD;  Location: [facility] OR;  Service: Orthopedics;  Laterality: Left;  . ANTERIOR VITRECTOMY Left 04/12/2016   Procedure: ANTERIOR CHAMBER WASH OUT WITH GAS FLUID EXCHANGE;  Surgeon: Carmela Rima, MD;  Location: [facility] OR;  Service: Ophthalmology;  Laterality: Left;  . CATARACT EXTRACTION Left   . EYE SURGERY     left eye surgery 04/2016  . KNEE SURGERY Left   . SHOULDER SURGERY Right   . TOE AMPUTATION Left 11/28/15   4th toe    FAMILY HISTORY: Family History  Problem Relation Age of Onset  . Cancer Paternal Grandmother   . Diabetes Mother   . COPD Mother   . Heart failure Mother   . Esophageal cancer Father   . Diabetes Sister   . Diabetes Maternal Uncle   . Diabetes Maternal Grandmother     SOCIAL HISTORY:  Social History   Social History  . Marital status: Divorced    Spouse name: N/A  . Number of children: 3  . Years of education: HS   Occupational History  . Disabled    Social History Main Topics  . Smoking status: Never Smoker  . Smokeless tobacco: Never Used  . Alcohol use No  . Drug use: No  . Sexual activity: Yes   Other Topics Concern  . Not on file   Social History Narrative   Lives at home with his children.   Right-handed.   No caffeine use.     PHYSICAL EXAM   Vitals:   10/06/16 1027  BP: 112/61  Pulse: 90  Weight: 217 lb (98.4 kg)  Height: 6\' 2"  (1.88 m)      Not recorded      Body mass index is 27.86 kg/m.  PHYSICAL EXAMNIATION:  Gen: NAD, conversant, well nourised, obese, well groomed                     Cardiovascular: Regular rate rhythm, no peripheral edema, warm, nontender. Eyes: Conjunctivae clear without exudates or hemorrhage Neck: Supple, no carotid bruits. Pulmonary: Clear to auscultation bilaterally   NEUROLOGICAL EXAM:  MENTAL STATUS: Speech:    Speech is normal; fluent and spontaneous with normal comprehension.  Cognition:     Orientation to time, place and person     Normal recent and remote memory     Normal Attention span and concentration     Normal Language, naming, repeating,spontaneous speech     Fund of knowledge   CRANIAL NERVES: CN II: Visual fields are full to confrontation. Fundoscopic exam is normal with sharp discs and no  vascular changes. Pupils are round equal and briskly reactive to light. CN III, IV, VI: extraocular movement are normal. No ptosis. CN V: Facial sensation is intact to pinprick in all 3 divisions bilaterally. Corneal responses are intact.  CN VII: Face is symmetric with normal eye closure and smile. CN VIII: Hearing is normal to rubbing fingers CN IX, X: Palate elevates symmetrically. Phonation is normal. CN XI: Head turning and shoulder shrug are intact CN XII: Tongue is midline with normal movements and no atrophy.  MOTOR: He has significant bilateral intrinsic hand muscle, distal leg and foot muscle atrophy, there was no significant proximal upper lower extremity weakness, he has moderate bilateral finger abduction, grip weakness, moderate bilateral ankle dorsiflexion, plantar flexion weakness   REFLEXES: Reflexes are  absent  and symmetric at the biceps, triceps, knees, and ankles. Plantar responses are flexor.  SENSORY: Length dependent decreased to light touch pinprick and vibratory sensation to knee level, and above wrist line.  COORDINATION: Rapid alternating  movements and fine finger movements are intact. There is no dysmetria on finger-to-nose and heel-knee-shin.    GAIT/STANCE: He needs to push up to get up from seated position, bilateral flailed foot, left foot in boot,   DIAGNOSTIC DATA (LABS, IMAGING, TESTING) - I reviewed patient records, labs, notes, testing and imaging myself where available.   ASSESSMENT AND PLAN  DAVONNE JARNIGAN is a 45 y.o. male   Diabetes, insulin-dependent,  Severe peripheral neuropathy  With evidence of significant axonal damage, distal weakness  Most consistent with severe diabetic sensorimotor peripheral neuropathy,  EMG nerve conduction study  Laboratory evaluation to rule out other etiology   Chronic neuropathic pain, insomnia  Gabapentin 300 mg titrating up to 3 tablets every night  Levert Feinstein, M.D. Ph.D.  [facility] [address], [address] [place], [place] [address] Ph: [phone] Fax: [phone]  CC: Referring Provider

## 2016-10-08 ENCOUNTER — Ambulatory Visit (INDEPENDENT_AMBULATORY_CARE_PROVIDER_SITE_OTHER): Payer: Medicaid Other | Admitting: Family

## 2016-10-08 ENCOUNTER — Encounter (INDEPENDENT_AMBULATORY_CARE_PROVIDER_SITE_OTHER): Payer: Self-pay | Admitting: Family

## 2016-10-08 VITALS — Ht 74.0 in | Wt 200.0 lb

## 2016-10-08 DIAGNOSIS — L97526 Non-pressure chronic ulcer of other part of left foot with bone involvement without evidence of necrosis: Secondary | ICD-10-CM

## 2016-10-08 DIAGNOSIS — E1142 Type 2 diabetes mellitus with diabetic polyneuropathy: Secondary | ICD-10-CM

## 2016-10-08 DIAGNOSIS — E11621 Type 2 diabetes mellitus with foot ulcer: Secondary | ICD-10-CM | POA: Diagnosis not present

## 2016-10-08 LAB — THYROID PANEL WITH TSH
Free Thyroxine Index: 2 (ref 1.2–4.9)
T3 UPTAKE RATIO: 26 % (ref 24–39)
T4 TOTAL: 7.6 ug/dL (ref 4.5–12.0)
TSH: 2.73 u[IU]/mL (ref 0.450–4.500)

## 2016-10-08 LAB — PROTEIN ELECTROPHORESIS, SERUM
A/G RATIO SPE: 1 (ref 0.7–1.7)
ALBUMIN ELP: 3.4 g/dL (ref 2.9–4.4)
ALPHA 1: 0.3 g/dL (ref 0.0–0.4)
Alpha 2: 0.9 g/dL (ref 0.4–1.0)
Beta: 1.2 g/dL (ref 0.7–1.3)
GAMMA GLOBULIN: 1 g/dL (ref 0.4–1.8)
Globulin, Total: 3.4 g/dL (ref 2.2–3.9)
TOTAL PROTEIN: 6.8 g/dL (ref 6.0–8.5)

## 2016-10-08 LAB — C-REACTIVE PROTEIN: CRP: 6.3 mg/L — AB (ref 0.0–4.9)

## 2016-10-08 LAB — HIV ANTIBODY (ROUTINE TESTING W REFLEX): HIV SCREEN 4TH GENERATION: NONREACTIVE

## 2016-10-08 LAB — RPR: RPR Ser Ql: NONREACTIVE

## 2016-10-08 LAB — VITAMIN B12: VITAMIN B 12: 544 pg/mL (ref 211–946)

## 2016-10-08 LAB — CK: Total CK: 90 U/L (ref 24–204)

## 2016-10-08 LAB — SEDIMENTATION RATE: SED RATE: 24 mm/h — AB (ref 0–15)

## 2016-10-08 LAB — FOLATE: FOLATE: 16.6 ng/mL (ref >3.0)

## 2016-10-08 NOTE — Progress Notes (Signed)
Office Visit Note   Patient: Travis [name]           Date of Birth: 10/24/1971           MRN: [id number] Visit Date: 10/08/2016              Requested by: No referring provider defined for this encounter. PCP: Salli RealYun Sun, MD   Assessment & Plan: Visit Diagnoses:  1. Diabetic ulcer of other part of left foot associated with type 2 diabetes mellitus, with bone involvement without evidence of necrosis (HCC)   2. Diabetic peripheral neuropathy associated with type 2 diabetes mellitus (HCC)     Plan: Advised to minimize weightbearing. We'll proceed with MRI as scheduled. Discussed likelihood of necessity for an salvage surgery. We'll follow up in the office in 2 weeks. Plan to discuss MRI results and plan for surgery or conservative wound care at that time.  Follow-Up Instructions: Return in about 2 weeks (around 10/22/2016).   Orders:  No orders of the defined types were placed in this encounter.  No orders of the defined types were placed in this encounter.     Procedures: No procedures performed   Clinical Data: No additional findings.   Subjective: Chief Complaint  Patient presents with  . Left Foot - Open Wound     Patient in today for ulcer left foot under 5th MTH. Began about 2 months ago and has been treated at the wound center with a total contact cast.  Per pt report it was almost healed and then the ulcer " got worse and now bone exposed" He is in a dry dressing today and a post op shoe full weight bearing. Ulcer is nickel sized and has a pink wound base. Moderate amount of brownish drain, no odor, on redness and no swelling. No c/o fever but the patient states tht he has been on abx therapy for the past 10 days. Recent xrays for review and MRI appt sch for the next 2 weeks. Here to discuss treatment options " see if my foot can be saved"    Review of Systems  Constitutional: Negative for chills and fever.     Objective: Vital Signs: Ht 6\' 2"  (1.88 m)   Wt  200 lb (90.7 kg)   BMI 25.68 kg/m   Physical Exam  Ortho Exam  Ulcer beneath the left fifth metatarsal head that is 1 cm in diameter. There is no drainage no surrounding maceration or erythema. Wound bed with pink granulation tissue. Laterally the ulcer does probe, probe 7 mm deep. Does not probe to bone today.   Specialty Comments:  No specialty comments available.  Imaging: No results found.    Radiographs from 09/28/16: IMPRESSION: Subluxation of the fifth metatarsal phalangeal joint with possible osteomyelitis.  Amputation site of the fourth metatarsal shows no evidence of osteomyelitis.  PMFS History: Patient Active Problem List   Diagnosis Date Noted  . Abnormality of gait and mobility 10/06/2016  . DKA (diabetic ketoacidoses) (HCC) 05/21/2016  . Nausea & vomiting 05/21/2016  . Ketoacidosis 05/21/2016  . Malnutrition of moderate degree 05/21/2016  . Gastroparesis due to DM (HCC)   . Increased intraocular pressure 04/12/2016  . Post-op bleeding 11/29/2015  . Diabetic ulcer of left foot (HCC) 11/14/2015  . Acute osteomyelitis of metatarsal bone of left foot (HCC), Left 4th metatarsal 11/12/2015  . Vitamin D deficiency 11/12/2015  . Osteoporosis 11/12/2015  . Closed fracture of right tibial plateau 11/11/2015  . Visual disturbance  11/03/2015  . Neuropathy (HCC) 11/03/2015  . Gastroesophageal reflux disease without esophagitis 11/03/2015  . 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 ALLERGIC RHINITIS 12/11/2008  . 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 CERVICALGIA 07/19/2008  . INSOMNIA 08/24/2007  . Dyslipidemia 08/10/2007  . Diabetes type 2, uncontrolled (HCC) 08/09/2007   Past Medical History:  Diagnosis Date  . Acute osteomyelitis of metatarsal bone of left foot (HCC) 11/12/2015  . Closed fracture of right tibial plateau 11/11/2015  . Depression with anxiety   . Diabetes mellitus    INSULIN DEPENDENT  Type II  . Diabetic peripheral neuropathy associated with type 2 diabetes mellitus (HCC) 08/30/2008   Qualifier: Diagnosis of  By: Daphine Deutscher FNP, Zena Amos    . Diabetic ulcer of left foot (HCC) 11/14/2015  . Gastroparesis   . GERD (gastroesophageal reflux disease)   . Osteoporosis 11/12/2015  . Peripheral neuropathy (HCC)   . Shortness of breath dyspnea    with exertion  . Vitamin D deficiency 11/12/2015    Family History  Problem Relation Age of Onset  . Cancer Paternal Grandmother   . Diabetes Mother   . COPD Mother   . Heart failure Mother   . Esophageal cancer Father   . Diabetes Sister   . Diabetes Maternal Uncle   . Diabetes Maternal Grandmother     Past Surgical History:  Procedure Laterality Date  . AMPUTATION Left 11/28/2015   Procedure: Left 4th Ray Amputation;  Surgeon: Nadara Mustard, MD;  [facility];  Service: Orthopedics;  Laterality: Left;  . ANTERIOR VITRECTOMY Left 04/12/2016   Procedure: ANTERIOR CHAMBER WASH OUT WITH GAS FLUID EXCHANGE;  Surgeon: Carmela Rima, MD;  Location: [facility] OR;  Service: Ophthalmology;  Laterality: Left;  . CATARACT EXTRACTION Left   . EYE SURGERY     left eye surgery 04/2016  . KNEE SURGERY Left   . SHOULDER SURGERY Right   . TOE AMPUTATION Left 11/28/15   4th toe   Social History   Occupational History  . Disabled    Social History Main Topics  . Smoking status: Never Smoker  . Smokeless tobacco: Never Used  . Alcohol use No  . Drug use: No  . Sexual activity: Yes

## 2016-10-11 ENCOUNTER — Ambulatory Visit (HOSPITAL_COMMUNITY): Admission: RE | Admit: 2016-10-11 | Payer: Medicaid Other | Source: Ambulatory Visit

## 2016-10-12 DIAGNOSIS — E11621 Type 2 diabetes mellitus with foot ulcer: Secondary | ICD-10-CM | POA: Diagnosis not present

## 2016-10-13 ENCOUNTER — Ambulatory Visit (HOSPITAL_COMMUNITY): Admission: RE | Admit: 2016-10-13 | Payer: No Typology Code available for payment source | Source: Ambulatory Visit

## 2016-10-13 ENCOUNTER — Encounter (HOSPITAL_BASED_OUTPATIENT_CLINIC_OR_DEPARTMENT_OTHER): Payer: Medicaid Other | Attending: Surgery

## 2016-10-13 ENCOUNTER — Ambulatory Visit (HOSPITAL_COMMUNITY)
Admission: RE | Admit: 2016-10-13 | Discharge: 2016-10-13 | Disposition: A | Discharge status: Home / Self Care | Payer: Medicaid Other | Source: Ambulatory Visit | Attending: Surgery | Admitting: Surgery

## 2016-10-13 DIAGNOSIS — L97509 Non-pressure chronic ulcer of other part of unspecified foot with unspecified severity: Secondary | ICD-10-CM

## 2016-10-13 DIAGNOSIS — E1169 Type 2 diabetes mellitus with other specified complication: Secondary | ICD-10-CM | POA: Insufficient documentation

## 2016-10-13 DIAGNOSIS — L97529 Non-pressure chronic ulcer of other part of left foot with unspecified severity: Secondary | ICD-10-CM | POA: Diagnosis not present

## 2016-10-13 DIAGNOSIS — M86372 Chronic multifocal osteomyelitis, left ankle and foot: Secondary | ICD-10-CM | POA: Insufficient documentation

## 2016-10-13 DIAGNOSIS — E11621 Type 2 diabetes mellitus with foot ulcer: Secondary | ICD-10-CM

## 2016-10-13 DIAGNOSIS — L97521 Non-pressure chronic ulcer of other part of left foot limited to breakdown of skin: Secondary | ICD-10-CM | POA: Insufficient documentation

## 2016-10-13 DIAGNOSIS — E1142 Type 2 diabetes mellitus with diabetic polyneuropathy: Secondary | ICD-10-CM | POA: Diagnosis not present

## 2016-10-13 DIAGNOSIS — M869 Osteomyelitis, unspecified: Secondary | ICD-10-CM | POA: Diagnosis not present

## 2016-10-13 DIAGNOSIS — L97522 Non-pressure chronic ulcer of other part of left foot with fat layer exposed: Secondary | ICD-10-CM

## 2016-10-13 LAB — POCT I-STAT CREATININE: CREATININE: 0.9 mg/dL (ref 0.61–1.24)

## 2016-10-13 MED ORDER — GADOBENATE DIMEGLUMINE 529 MG/ML IV SOLN
20.0000 mL | Freq: Once | INTRAVENOUS | Status: AC | PRN
  Administered 2016-10-13: 20 mL via INTRAVENOUS

## 2016-10-19 ENCOUNTER — Encounter (HOSPITAL_BASED_OUTPATIENT_CLINIC_OR_DEPARTMENT_OTHER): Payer: No Typology Code available for payment source

## 2016-10-19 DIAGNOSIS — E11621 Type 2 diabetes mellitus with foot ulcer: Secondary | ICD-10-CM | POA: Diagnosis not present

## 2016-10-20 ENCOUNTER — Ambulatory Visit (INDEPENDENT_AMBULATORY_CARE_PROVIDER_SITE_OTHER): Payer: Medicaid Other | Admitting: Family

## 2016-10-20 ENCOUNTER — Encounter (INDEPENDENT_AMBULATORY_CARE_PROVIDER_SITE_OTHER): Payer: Self-pay | Admitting: Family

## 2016-10-20 VITALS — Ht 74.0 in | Wt 200.0 lb

## 2016-10-20 DIAGNOSIS — M86272 Subacute osteomyelitis, left ankle and foot: Secondary | ICD-10-CM

## 2016-10-20 HISTORY — DX: Subacute osteomyelitis, left ankle and foot: M86.272

## 2016-10-20 MED ORDER — DOXYCYCLINE HYCLATE 100 MG PO TABS: 100.0000 mg | ORAL_TABLET | Freq: Two times a day (BID) | ORAL | 0 refills | Status: DC

## 2016-10-20 NOTE — Progress Notes (Signed)
Wound Care Note   Patient: Travis [name]           Date of Birth: 01/11/1971           MRN: [id number]             PCP: Salli RealYun Sun, MD Visit Date: 10/20/2016   Assessment & Plan: Visit Diagnoses:  1. Subacute osteomyelitis of left foot (HCC)     Plan: We will set him up for a fifth ray amputation next Friday. Have discussed risks of surgery, patient agrees to plan. Continue him with wound care and doxycycline until surgery.  Follow-Up Instructions: No Follow-up on file.  Orders:  No orders of the defined types were placed in this encounter.  Meds ordered this encounter  Medications  . doxycycline (VIBRA-TABS) 100 MG tablet    Sig: Take 1 tablet (100 mg total) by mouth 2 (two) times daily.    Dispense:  20 tablet    Refill:  0      Procedures: No notes on file   Clinical Data: No additional findings.   No images are attached to the encounter.   Subjective: Chief Complaint  Patient presents with  . Left Foot - Follow-up    Review MRI    Patient is s/p an MRI of the left foot. To discuss surgical options today. He is weight bearing in a post op shoe with a dry dressing. He states that he finished his bactrim rx last night.   Been following with the wound center. Has completed an MRI to rule out osteomyelitis. MRI showed osteo to the 5th metatarsal as well as proximal phalanx.  Review of Systems  Constitutional: Negative for chills and fever.  Skin: Positive for wound.  All other systems reviewed and are negative.     Objective: Vital Signs: Ht 6\' 2"  (1.88 m)   Wt 200 lb (90.7 kg)   BMI 25.68 kg/m   Physical Exam: Ulcer beneath fifth metatarsal head. Measures 7 mm in diameter. This probes to bone. Purulent and bloody drainage following removal of silvercell packing. No surrounding erythema or edema. No ascending cellulitlis.   Specialty Comments: No specialty comments available.   PMFS History: Patient Active Problem List   Diagnosis Date Noted  .  Subacute osteomyelitis of left foot (HCC) 10/20/2016  . Abnormality of gait and mobility 10/06/2016  . DKA (diabetic ketoacidoses) (HCC) 05/21/2016  . Nausea & vomiting 05/21/2016  . Ketoacidosis 05/21/2016  . Malnutrition of moderate degree 05/21/2016  . Gastroparesis due to DM (HCC)   . Increased intraocular pressure 04/12/2016  . Post-op bleeding 11/29/2015  . Diabetic ulcer of left foot (HCC) 11/14/2015  . Acute osteomyelitis of metatarsal bone of left foot (HCC), Left 4th metatarsal 11/12/2015  . Vitamin D deficiency 11/12/2015  . Osteoporosis 11/12/2015  . Closed fracture of right tibial plateau 11/11/2015  . Visual disturbance 11/03/2015  . Neuropathy (HCC) 11/03/2015  . Gastroesophageal reflux disease without esophagitis 11/03/2015  . 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 ALLERGIC RHINITIS 12/11/2008  . 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 CERVICALGIA 07/19/2008  . INSOMNIA 08/24/2007  . Dyslipidemia 08/10/2007  . Diabetes type 2, uncontrolled (HCC) 08/09/2007   Past Medical History:  Diagnosis Date  . Acute osteomyelitis of metatarsal bone of left foot (HCC) 11/12/2015  . Closed fracture of right tibial plateau 11/11/2015  . Depression with anxiety   . Diabetes mellitus    INSULIN DEPENDENT Type II  .  Diabetic peripheral neuropathy associated with type 2 diabetes mellitus (HCC) 08/30/2008   Qualifier: Diagnosis of  By: Daphine DeutscherMartin FNP, Zena AmosNykedtra    . Diabetic ulcer of left foot (HCC) 11/14/2015  . Gastroparesis   . GERD (gastroesophageal reflux disease)   . Osteoporosis 11/12/2015  . Peripheral neuropathy (HCC)   . Shortness of breath dyspnea    with exertion  . Vitamin D deficiency 11/12/2015    Family History  Problem Relation Age of Onset  . Cancer Paternal Grandmother   . Diabetes Mother   . COPD Mother   . Heart failure Mother   . Esophageal cancer Father   . Diabetes Sister   . Diabetes Maternal  Uncle   . Diabetes Maternal Grandmother    Past Surgical History:  Procedure Laterality Date  . AMPUTATION Left 11/28/2015   Procedure: Left 4th Ray Amputation;  Surgeon: Nadara MustardMarcus Duda V, MD;  [facility];  Service: Orthopedics;  Laterality: Left;  . ANTERIOR VITRECTOMY Left 04/12/2016   Procedure: ANTERIOR CHAMBER WASH OUT WITH GAS FLUID EXCHANGE;  Surgeon: Carmela RimaNarendra Patel, MD;  [facility];  Service: Ophthalmology;  Laterality: Left;  . CATARACT EXTRACTION Left   . EYE SURGERY     left eye surgery 04/2016  . KNEE SURGERY Left   . SHOULDER SURGERY Right   . TOE AMPUTATION Left 11/28/15   4th toe   Social History   Occupational History  . Disabled    Social History Main Topics  . Smoking status: Never Smoker  . Smokeless tobacco: Never Used  . Alcohol use No  . Drug use: No  . Sexual activity: Yes

## 2016-10-21 ENCOUNTER — Other Ambulatory Visit (INDEPENDENT_AMBULATORY_CARE_PROVIDER_SITE_OTHER): Payer: Self-pay | Admitting: Family

## 2016-10-26 DIAGNOSIS — E11621 Type 2 diabetes mellitus with foot ulcer: Secondary | ICD-10-CM | POA: Diagnosis not present

## 2016-10-28 ENCOUNTER — Encounter (HOSPITAL_COMMUNITY): Payer: Self-pay | Admitting: *Deleted

## 2016-10-28 NOTE — Progress Notes (Signed)
Pt denies SOB, chest pain, and being under the care of a cardiologist. Pt denies having a cardiac cath. Pt stated that he may have had an echo performed [facility] but recently had an A1c done there;records requested. Pt stated that his fasting blood glucose (BG) ranges from 110-115. Pt made aware of diabetes protocol to not take bedtime dose of Novolog, take half dose of Levemir insulin at HS (10 units) and to not take Metformin or Novolog morning of surgery. Pt made aware to check BG every 2 hours prior to arrival DOS, interventions for a BG < 70 ( 4 ounces of apple or cranberry juice, or 4 glucose tabs or gel and recheck BG 15 minutes after intervention)  And for BG>220 (half dose of Novolog sliding scale). Pt made aware to stop taking vitamins, fish oil and herbal medications. Do not take any NSAIDs ie: Ibuprofen, Advil, Naproxen, BC and Goody Powder. Pt verbalized understanding of all pre-op instructions. Rica MastAngela Kabbe, NP, Anesthesia, asked to review pt stress test.

## 2016-10-28 NOTE — Progress Notes (Signed)
Anesthesia Chart Review:  Pt is a same day work up.   Pt is a 45 year old male scheduled for L 5th ray amputation on 10/29/2016 with Aldean BakerMarcus Duda, MD,   - PCP is Salli RealYun Sun, MD, last office visit 10/14/16.  - Cardiologist is Lollie Marrowaymond Rosario, MD, last office visit July 2017  PMH includes:  DM, osteomyelitis, GERD.  Never smoker. BMI 26. S/p L 4th ray amputation  Medications include: novolog, levemir, metformin.  Labs will be obtained DOS.   EKG 05/20/16: Sinus rhythm. Anterolateral infarct, age indeterminate  Nuclear stress test 07/29/16:   Defect 1: There is a medium defect of severe severity present in the mid anteroseptal, mid inferoseptal and apex location.  Findings consistent with prior myocardial infarction. There is no ischemia identified.  This is an intermediate risk study based upon severely reduced ejection fraction consistent with prior infarction.  The left ventricular ejection fraction is severely decreased (<30%).  Nuclear stress EF: 26%.  Echo 06/30/16 (at [facility]): 1. Normal study: Number cardiac chamber sizes and function; normal valve anatomy and function; no pericardial effusion or intracardiac mass.  2. No intracardiac shunts. Normal thoracic aorta and aortic arch  Reviewed case with Dr. Glade Stanford. Fitzgerald who asked that I reach out to Dr. Hanley Haysosario about his impression of abnormal stress test results. Dr. Hanley Haysosario was already gone for the day. I spoke with Burlene ArntMike Durand, PA. It does not appear that pt had been seen since stress test done.  Mr. Asa SaunasDurand will arrange for follow up and he felt pt could proceed with amputation if done under regional block.  Pt is posted for choice anesthesia.   Pt will need further assessment by assigned anesthesiologist DOS. If no acute CV symptoms and labs are acceptable, I anticipate pt can proceed as scheduled.   Rica Mastngela Chad Donoghue, FNP-BC [facility]/Anesthesiology Phone: [phone] 10/28/2016 4:35 PM

## 2016-10-29 ENCOUNTER — Encounter (HOSPITAL_COMMUNITY)
Admission: RE | Disposition: A | Discharge status: Home / Self Care | Payer: Self-pay | Source: Ambulatory Visit | Attending: Orthopedic Surgery

## 2016-10-29 ENCOUNTER — Encounter (HOSPITAL_COMMUNITY): Payer: Self-pay | Admitting: *Deleted

## 2016-10-29 ENCOUNTER — Ambulatory Visit (HOSPITAL_COMMUNITY): Payer: Medicaid Other | Admitting: Emergency Medicine

## 2016-10-29 ENCOUNTER — Ambulatory Visit (HOSPITAL_COMMUNITY)
Admission: RE | Admit: 2016-10-29 | Discharge: 2016-10-30 | Disposition: A | Discharge status: Home / Self Care | Payer: Medicaid Other | Source: Ambulatory Visit | Attending: Orthopedic Surgery | Admitting: Orthopedic Surgery

## 2016-10-29 DIAGNOSIS — F418 Other specified anxiety disorders: Secondary | ICD-10-CM | POA: Diagnosis not present

## 2016-10-29 DIAGNOSIS — K219 Gastro-esophageal reflux disease without esophagitis: Secondary | ICD-10-CM | POA: Insufficient documentation

## 2016-10-29 DIAGNOSIS — Z794 Long term (current) use of insulin: Secondary | ICD-10-CM | POA: Insufficient documentation

## 2016-10-29 DIAGNOSIS — E1143 Type 2 diabetes mellitus with diabetic autonomic (poly)neuropathy: Secondary | ICD-10-CM | POA: Diagnosis not present

## 2016-10-29 DIAGNOSIS — M868X7 Other osteomyelitis, ankle and foot: Secondary | ICD-10-CM | POA: Diagnosis not present

## 2016-10-29 DIAGNOSIS — E11621 Type 2 diabetes mellitus with foot ulcer: Secondary | ICD-10-CM | POA: Insufficient documentation

## 2016-10-29 DIAGNOSIS — R2689 Other abnormalities of gait and mobility: Secondary | ICD-10-CM

## 2016-10-29 DIAGNOSIS — E559 Vitamin D deficiency, unspecified: Secondary | ICD-10-CM | POA: Diagnosis not present

## 2016-10-29 DIAGNOSIS — Z89422 Acquired absence of other left toe(s): Secondary | ICD-10-CM | POA: Diagnosis not present

## 2016-10-29 DIAGNOSIS — M810 Age-related osteoporosis without current pathological fracture: Secondary | ICD-10-CM | POA: Diagnosis not present

## 2016-10-29 DIAGNOSIS — Z833 Family history of diabetes mellitus: Secondary | ICD-10-CM | POA: Diagnosis not present

## 2016-10-29 DIAGNOSIS — Z800 Family history of malignant neoplasm of digestive organs: Secondary | ICD-10-CM | POA: Diagnosis not present

## 2016-10-29 DIAGNOSIS — M86172 Other acute osteomyelitis, left ankle and foot: Secondary | ICD-10-CM

## 2016-10-29 DIAGNOSIS — E1169 Type 2 diabetes mellitus with other specified complication: Secondary | ICD-10-CM | POA: Insufficient documentation

## 2016-10-29 DIAGNOSIS — Z9842 Cataract extraction status, left eye: Secondary | ICD-10-CM | POA: Insufficient documentation

## 2016-10-29 DIAGNOSIS — Z825 Family history of asthma and other chronic lower respiratory diseases: Secondary | ICD-10-CM | POA: Diagnosis not present

## 2016-10-29 HISTORY — DX: Osteomyelitis, unspecified: M86.9

## 2016-10-29 HISTORY — PX: AMPUTATION: SHX166

## 2016-10-29 LAB — GLUCOSE, CAPILLARY
GLUCOSE-CAPILLARY: 99 mg/dL (ref 65–99)
GLUCOSE-CAPILLARY: 115 mg/dL — AB (ref 65–99)
GLUCOSE-CAPILLARY: 136 mg/dL — AB (ref 65–99)
Glucose-Capillary: 119 mg/dL — ABNORMAL HIGH (ref 65–99)
Glucose-Capillary: 112 mg/dL — ABNORMAL HIGH (ref 65–99)

## 2016-10-29 SURGERY — AMPUTATION, FOOT, RAY
Anesthesia: General | Laterality: Left

## 2016-10-29 MED ORDER — GABAPENTIN 600 MG PO TABS
1800.0000 mg | ORAL_TABLET | Freq: Every day | ORAL | Status: DC
  Administered 2016-10-29: 1800 mg via ORAL
  Filled 2016-10-29: qty 3

## 2016-10-29 MED ORDER — METFORMIN HCL 500 MG PO TABS
1000.0000 mg | ORAL_TABLET | Freq: Two times a day (BID) | ORAL | Status: DC
  Administered 2016-10-29 – 2016-10-30 (×2): 1000 mg via ORAL
  Filled 2016-10-29 (×2): qty 2

## 2016-10-29 MED ORDER — SODIUM CHLORIDE 0.9 % IV SOLN: INTRAVENOUS | Status: DC

## 2016-10-29 MED ORDER — ACETAMINOPHEN 650 MG RE SUPP: 650.0000 mg | Freq: Four times a day (QID) | RECTAL | Status: DC | PRN

## 2016-10-29 MED ORDER — INSULIN DETEMIR 100 UNIT/ML ~~LOC~~ SOLN
10.0000 [IU] | Freq: Every day | SUBCUTANEOUS | Status: DC
  Administered 2016-10-29: 10 [IU] via SUBCUTANEOUS
  Filled 2016-10-29 (×2): qty 0.1

## 2016-10-29 MED ORDER — METHOCARBAMOL 500 MG PO TABS
500.0000 mg | ORAL_TABLET | Freq: Four times a day (QID) | ORAL | Status: DC | PRN
  Administered 2016-10-29: 500 mg via ORAL
  Filled 2016-10-29 (×2): qty 1

## 2016-10-29 MED ORDER — OXYCODONE HCL 5 MG PO TABS
5.0000 mg | ORAL_TABLET | ORAL | Status: DC | PRN
  Administered 2016-10-29: 10 mg via ORAL
  Filled 2016-10-29 (×2): qty 2

## 2016-10-29 MED ORDER — CHLORHEXIDINE GLUCONATE 4 % EX LIQD: 60.0000 mL | Freq: Once | CUTANEOUS | Status: DC

## 2016-10-29 MED ORDER — ONDANSETRON HCL 4 MG PO TABS: 4.0000 mg | ORAL_TABLET | Freq: Four times a day (QID) | ORAL | Status: DC | PRN

## 2016-10-29 MED ORDER — INSULIN ASPART 100 UNIT/ML ~~LOC~~ SOLN: 0.0000 [IU] | Freq: Three times a day (TID) | SUBCUTANEOUS | Status: DC

## 2016-10-29 MED ORDER — ACETAMINOPHEN 325 MG PO TABS: 650.0000 mg | ORAL_TABLET | Freq: Four times a day (QID) | ORAL | Status: DC | PRN

## 2016-10-29 MED ORDER — EPHEDRINE 5 MG/ML INJ
INTRAVENOUS | Status: AC
  Filled 2016-10-29: qty 10

## 2016-10-29 MED ORDER — PROPOFOL 10 MG/ML IV BOLUS
INTRAVENOUS | Status: DC | PRN
  Administered 2016-10-29: 200 mg via INTRAVENOUS

## 2016-10-29 MED ORDER — PROPOFOL 10 MG/ML IV BOLUS
INTRAVENOUS | Status: AC
  Filled 2016-10-29: qty 20

## 2016-10-29 MED ORDER — FENTANYL CITRATE (PF) 100 MCG/2ML IJ SOLN
INTRAMUSCULAR | Status: DC | PRN
  Administered 2016-10-29: 100 ug via INTRAVENOUS

## 2016-10-29 MED ORDER — MIDAZOLAM HCL 2 MG/2ML IJ SOLN
INTRAMUSCULAR | Status: AC
  Filled 2016-10-29: qty 2

## 2016-10-29 MED ORDER — 0.9 % SODIUM CHLORIDE (POUR BTL) OPTIME
TOPICAL | Status: DC | PRN
Start: 2016-10-29 — End: 2016-10-29
  Administered 2016-10-29: 1000 mL

## 2016-10-29 MED ORDER — HYDROCODONE-ACETAMINOPHEN 5-325 MG PO TABS: 1.0000 | ORAL_TABLET | ORAL | 0 refills | Status: DC | PRN

## 2016-10-29 MED ORDER — METHOCARBAMOL 1000 MG/10ML IJ SOLN
500.0000 mg | Freq: Four times a day (QID) | INTRAVENOUS | Status: DC | PRN
  Filled 2016-10-29: qty 5

## 2016-10-29 MED ORDER — ASPIRIN EC 325 MG PO TBEC
325.0000 mg | DELAYED_RELEASE_TABLET | Freq: Every day | ORAL | Status: DC
  Administered 2016-10-30: 325 mg via ORAL
  Filled 2016-10-29: qty 1

## 2016-10-29 MED ORDER — ONDANSETRON HCL 4 MG/2ML IJ SOLN
INTRAMUSCULAR | Status: AC
  Filled 2016-10-29: qty 2

## 2016-10-29 MED ORDER — METOCLOPRAMIDE HCL 10 MG PO TABS
10.0000 mg | ORAL_TABLET | Freq: Three times a day (TID) | ORAL | Status: DC
  Administered 2016-10-29 – 2016-10-30 (×3): 10 mg via ORAL
  Filled 2016-10-29 (×3): qty 1

## 2016-10-29 MED ORDER — CEFAZOLIN IN D5W 1 GM/50ML IV SOLN
1.0000 g | Freq: Four times a day (QID) | INTRAVENOUS | Status: AC
  Administered 2016-10-29 – 2016-10-30 (×3): 1 g via INTRAVENOUS
  Filled 2016-10-29 (×3): qty 50

## 2016-10-29 MED ORDER — HYDROMORPHONE HCL 1 MG/ML IJ SOLN: 0.2500 mg | INTRAMUSCULAR | Status: DC | PRN

## 2016-10-29 MED ORDER — ONDANSETRON HCL 4 MG/2ML IJ SOLN: 4.0000 mg | Freq: Once | INTRAMUSCULAR | Status: DC | PRN

## 2016-10-29 MED ORDER — ONDANSETRON HCL 4 MG/2ML IJ SOLN: 4.0000 mg | Freq: Four times a day (QID) | INTRAMUSCULAR | Status: DC | PRN

## 2016-10-29 MED ORDER — FENTANYL CITRATE (PF) 100 MCG/2ML IJ SOLN
INTRAMUSCULAR | Status: AC
  Filled 2016-10-29: qty 2

## 2016-10-29 MED ORDER — METOCLOPRAMIDE HCL 5 MG PO TABS: 5.0000 mg | ORAL_TABLET | Freq: Three times a day (TID) | ORAL | Status: DC | PRN

## 2016-10-29 MED ORDER — LACTATED RINGERS IV SOLN
INTRAVENOUS | Status: DC
Start: 2016-10-29 — End: 2016-10-29
  Administered 2016-10-29 (×2): via INTRAVENOUS

## 2016-10-29 MED ORDER — MIDAZOLAM HCL 5 MG/5ML IJ SOLN
INTRAMUSCULAR | Status: DC | PRN
  Administered 2016-10-29: 2 mg via INTRAVENOUS

## 2016-10-29 MED ORDER — ONDANSETRON HCL 4 MG/2ML IJ SOLN
INTRAMUSCULAR | Status: DC | PRN
  Administered 2016-10-29: 4 mg via INTRAVENOUS

## 2016-10-29 MED ORDER — MEPERIDINE HCL 25 MG/ML IJ SOLN: 6.2500 mg | INTRAMUSCULAR | Status: DC | PRN

## 2016-10-29 MED ORDER — OFLOXACIN 0.3 % OP SOLN: 1.0000 [drp] | Freq: Four times a day (QID) | OPHTHALMIC | Status: DC

## 2016-10-29 MED ORDER — LIDOCAINE 2% (20 MG/ML) 5 ML SYRINGE
INTRAMUSCULAR | Status: AC
  Filled 2016-10-29: qty 5

## 2016-10-29 MED ORDER — HYDROMORPHONE HCL 2 MG/ML IJ SOLN: 1.0000 mg | INTRAMUSCULAR | Status: DC | PRN

## 2016-10-29 MED ORDER — EPHEDRINE SULFATE 50 MG/ML IJ SOLN
INTRAMUSCULAR | Status: DC | PRN
  Administered 2016-10-29 (×2): 10 mg via INTRAVENOUS

## 2016-10-29 MED ORDER — LIDOCAINE HCL (CARDIAC) 20 MG/ML IV SOLN
INTRAVENOUS | Status: DC | PRN
  Administered 2016-10-29: 100 mg via INTRAVENOUS

## 2016-10-29 MED ORDER — CEFAZOLIN SODIUM-DEXTROSE 2-4 GM/100ML-% IV SOLN
2.0000 g | INTRAVENOUS | Status: AC
  Administered 2016-10-29: 2 g via INTRAVENOUS
  Filled 2016-10-29: qty 100

## 2016-10-29 MED ORDER — METOCLOPRAMIDE HCL 5 MG/ML IJ SOLN: 5.0000 mg | Freq: Three times a day (TID) | INTRAMUSCULAR | Status: DC | PRN

## 2016-10-29 MED ORDER — INSULIN ASPART 100 UNIT/ML ~~LOC~~ SOLN: 4.0000 [IU] | Freq: Three times a day (TID) | SUBCUTANEOUS | Status: DC

## 2016-10-29 SURGICAL SUPPLY — 39 items
BLADE SAW SGTL MED 73X18.5 STR (BLADE) IMPLANT
BLADE SURG 21 STRL SS (BLADE) IMPLANT
BNDG COHESIVE 4X5 TAN STRL (GAUZE/BANDAGES/DRESSINGS) IMPLANT
BNDG COHESIVE 6X5 TAN STRL LF (GAUZE/BANDAGES/DRESSINGS) ×2 IMPLANT
BNDG GAUZE ELAST 4 BULKY (GAUZE/BANDAGES/DRESSINGS) ×2 IMPLANT
COVER SURGICAL LIGHT HANDLE (MISCELLANEOUS) ×2 IMPLANT
DRAPE U-SHAPE 47X51 STRL (DRAPES) ×2 IMPLANT
DRSG ADAPTIC 3X8 NADH LF (GAUZE/BANDAGES/DRESSINGS) ×2 IMPLANT
DRSG PAD ABDOMINAL 8X10 ST (GAUZE/BANDAGES/DRESSINGS) ×2 IMPLANT
DURAPREP 26ML APPLICATOR (WOUND CARE) ×2 IMPLANT
ELECT REM PT RETURN 9FT ADLT (ELECTROSURGICAL) ×2
ELECTRODE REM PT RTRN 9FT ADLT (ELECTROSURGICAL) ×1 IMPLANT
GAUZE SPONGE 4X4 12PLY STRL (GAUZE/BANDAGES/DRESSINGS) IMPLANT
GLOVE BIOGEL PI IND STRL 6.5 (GLOVE) ×1 IMPLANT
GLOVE BIOGEL PI IND STRL 9 (GLOVE) ×1 IMPLANT
GLOVE BIOGEL PI INDICATOR 6.5 (GLOVE) ×1
GLOVE BIOGEL PI INDICATOR 9 (GLOVE) ×1
GLOVE INDICATOR 7.5 STRL GRN (GLOVE) ×2 IMPLANT
GLOVE SURG ORTHO 9.0 STRL STRW (GLOVE) ×2 IMPLANT
GLOVE SURG SS PI 6.0 STRL IVOR (GLOVE) ×2 IMPLANT
GLOVE SURG SS PI 6.5 STRL IVOR (GLOVE) ×2 IMPLANT
GOWN STRL REUS W/ TWL LRG LVL3 (GOWN DISPOSABLE) ×2 IMPLANT
GOWN STRL REUS W/ TWL XL LVL3 (GOWN DISPOSABLE) ×2 IMPLANT
GOWN STRL REUS W/TWL LRG LVL3 (GOWN DISPOSABLE) ×2
GOWN STRL REUS W/TWL XL LVL3 (GOWN DISPOSABLE) ×2
KIT BASIN OR (CUSTOM PROCEDURE TRAY) ×2 IMPLANT
KIT ROOM TURNOVER OR (KITS) ×2 IMPLANT
NS IRRIG 1000ML POUR BTL (IV SOLUTION) ×2 IMPLANT
PACK ORTHO EXTREMITY (CUSTOM PROCEDURE TRAY) ×2 IMPLANT
PAD ARMBOARD 7.5X6 YLW CONV (MISCELLANEOUS) ×4 IMPLANT
SPONGE LAP 18X18 X RAY DECT (DISPOSABLE) IMPLANT
STOCKINETTE IMPERVIOUS LG (DRAPES) ×2 IMPLANT
SUT ETHILON 2 0 PSLX (SUTURE) ×4 IMPLANT
TOWEL OR 17X24 6PK STRL BLUE (TOWEL DISPOSABLE) ×2 IMPLANT
TOWEL OR 17X26 10 PK STRL BLUE (TOWEL DISPOSABLE) ×2 IMPLANT
TUBE CONNECTING 12X1/4 (SUCTIONS) ×2 IMPLANT
UNDERPAD 30X30 (UNDERPADS AND DIAPERS) IMPLANT
WATER STERILE IRR 1000ML POUR (IV SOLUTION) IMPLANT
YANKAUER SUCT BULB TIP NO VENT (SUCTIONS) ×2 IMPLANT

## 2016-10-29 NOTE — H&P (Signed)
Travis [name] is an 45 y.o. male.   Chief Complaint: Osteomyelitis ulceration left foot fifth metatarsal head. 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  Past Medical History:  Diagnosis Date  . Acute osteomyelitis of metatarsal bone of left foot (HCC) 11/12/2015  . Anxiety   . Closed fracture of right tibial plateau 11/11/2015  . Depression   . Depression with anxiety   . Diabetes mellitus    INSULIN DEPENDENT Type II  . Diabetic peripheral neuropathy associated with type 2 diabetes mellitus (HCC) 08/30/2008   Qualifier: Diagnosis of  By: Daphine DeutscherMartin FNP, Zena AmosNykedtra    . Diabetic ulcer of left foot (HCC) 11/14/2015  . Gastroparesis   . GERD (gastroesophageal reflux disease)   . Osteomyelitis (HCC)   . Osteoporosis 11/12/2015  . Peripheral neuropathy (HCC)   . Shortness of breath dyspnea    with exertion  . Vitamin D deficiency 11/12/2015    Past Surgical History:  Procedure Laterality Date  . AMPUTATION Left 11/28/2015   Procedure: Left 4th Ray Amputation;  Surgeon: Nadara MustardMarcus Duda V, MD;  [facility];  Service: Orthopedics;  Laterality: Left;  . ANTERIOR VITRECTOMY Left 04/12/2016   Procedure: ANTERIOR CHAMBER WASH OUT WITH GAS FLUID EXCHANGE;  Surgeon: Carmela RimaNarendra Patel, MD;  [facility];  Service: Ophthalmology;  Laterality: Left;  . CATARACT EXTRACTION Left   . EYE SURGERY     left eye surgery 04/2016  . KNEE SURGERY Left   . SHOULDER SURGERY Right   . TOE AMPUTATION Left 11/28/15   4th toe    Family History  Problem Relation Age of Onset  . Cancer Paternal Grandmother   . Diabetes Mother   . COPD Mother   . Heart failure Mother   . Esophageal cancer Father   . Diabetes Sister   . Diabetes Maternal Uncle   . Diabetes Maternal Grandmother    Social History:  reports that he has never smoked. He  has never used smokeless tobacco. He reports that he does not drink alcohol or use drugs.  Allergies:  Allergies  Allergen Reactions  . No Known Allergies     No prescriptions prior to admission.    No results found for this or any previous visit (from the past 48 hour(s)). No results found.  Review of Systems  All other systems reviewed and are negative.   There were no vitals taken for this visit. Physical Exam  Examination patient is alert oriented no neuropathy well-dressed normal affect or store effort he does have antalgic gait he has palpable pulses he does not have protective sensation. He has ulceration ostium myelitis fifth metatarsal head left foot status post fourth ray amputation. Assessment/Plan Assessment: Diabetic insensate neuropathy with osteomyelitis fifth metatarsal head left foot.  Plan: Patient will plan for left foot fifth ray amputation risk and benefits were discussed including risk of the wound not healing and need for higher level amputation. patient states he understands and wishes to proceed at this time.  Nadara MustardMarcus V Duda, MD 10/29/2016, 8:27 AM

## 2016-10-29 NOTE — Progress Notes (Signed)
Orthopedic Tech Progress Note Patient Details:  Travis CabalMichael T [name] 10/16/1971 [id number]  Ortho Devices Type of Ortho Device: Postop shoe/boot Ortho Device/Splint Location: LLE Ortho Device/Splint Interventions: Ordered, Application   [name], [name] 10/29/2016, 9:10 PM

## 2016-10-29 NOTE — Anesthesia Procedure Notes (Signed)
Procedure Name: LMA Insertion Date/Time: 10/29/2016 1:45 PM Performed by: Rosiland OzMEYERS, Stanford Strauch Pre-anesthesia Checklist: Emergency Drugs available, Suction available, Patient identified, Timeout performed and Patient being monitored Patient Re-evaluated:Patient Re-evaluated prior to inductionOxygen Delivery Method: Circle system utilized Preoxygenation: Pre-oxygenation with 100% oxygen Intubation Type: IV induction LMA: LMA inserted LMA Size: 5.0 Placement Confirmation: positive ETCO2 and breath sounds checked- equal and bilateral Tube secured with: Tape Dental Injury: Teeth and Oropharynx as per pre-operative assessment

## 2016-10-29 NOTE — Transfer of Care (Signed)
Immediate Anesthesia Transfer of Care Note  Patient: Travis [name]  Procedure(s) Performed: Procedure(s): LEFT 5TH RAY AMPUTATION (Left)  Patient Location: PACU  Anesthesia Type:General  Level of Consciousness: awake and patient cooperative  Airway & Oxygen Therapy: Patient Spontanous Breathing  Post-op Assessment: Report given to RN and Post -op Vital signs reviewed and stable  Post vital signs: Reviewed and stable  Last Vitals:  Vitals:   10/29/16 1121  BP: 122/69  Pulse: 78  Resp: 18  Temp: 37 C    Last Pain:  Vitals:   10/29/16 1121  TempSrc: Oral      Patients Stated Pain Goal: 6 (10/29/16 1107)  Complications: No apparent anesthesia complications

## 2016-10-29 NOTE — Op Note (Signed)
10/29/2016  2:15 PM  PATIENT:  Travis [name]    PRE-OPERATIVE DIAGNOSIS:  Osteomyelitis Left 5th Metatarsal  POST-OPERATIVE DIAGNOSIS:  Same  PROCEDURE:  LEFT 5TH RAY AMPUTATION Local tissue rearrangement for wound closure 7 x 4 cm   SURGEON:  Nadara MustardMarcus V Ivette Castronova, MD  PHYSICIAN ASSISTANT:None ANESTHESIA:   General  PREOPERATIVE INDICATIONS:  Travis [name] is a  45 y.o. male with a diagnosis of Osteomyelitis Left 5th Metatarsal who failed conservative measures and elected for surgical management.    The risks benefits and alternatives were discussed with the patient preoperatively including but not limited to the risks of infection, bleeding, nerve injury, cardiopulmonary complications, the need for revision surgery, among others, and the patient was willing to proceed.  OPERATIVE IMPLANTS: None  OPERATIVE FINDINGS: Good petechial bleeding no deep abscess  OPERATIVE PROCEDURE: Patient brought to operating room and underwent a general anesthetic. After adequate levels anesthesia obtained patient's left lower extremity was prepped using DuraPrep draped into a sterile field a timeout was called. Elliptical incision was made around the ulcer in the toe the toe and ulcerative tissue were resected in 1 block of tissue electrocautery was used for hemostasis the wound was irrigated with normal saline. Local tissue rearrangement was used to close a wound 7 x 4 cm. A sterile compressive dressing was applied. Patient was extubated taken the PACU in stable condition.

## 2016-10-29 NOTE — Anesthesia Postprocedure Evaluation (Signed)
Anesthesia Post Note  Patient: Katha CabalMichael T Salway  Procedure(s) Performed: Procedure(s) (LRB): LEFT 5TH RAY AMPUTATION (Left)  Patient location during evaluation: PACU Anesthesia Type: General Level of consciousness: awake and alert Pain management: pain level controlled Vital Signs Assessment: post-procedure vital signs reviewed and stable Respiratory status: spontaneous breathing, nonlabored ventilation, respiratory function stable and patient connected to nasal cannula oxygen Cardiovascular status: blood pressure returned to baseline and stable Postop Assessment: no signs of nausea or vomiting Anesthetic complications: no    Last Vitals:  Vitals:   10/29/16 1500 10/29/16 1530  BP: 107/63 124/70  Pulse: 84 85  Resp: 15 16  Temp:  36.7 C    Last Pain:  Vitals:   10/29/16 1530  TempSrc: Oral                 Shantele Reller DAVID

## 2016-10-29 NOTE — Anesthesia Preprocedure Evaluation (Signed)
Anesthesia Evaluation  Patient identified by MRN, date of birth, ID band Patient awake    Reviewed: Allergy & Precautions, NPO status , Patient's Chart, lab work & pertinent test results  Airway Mallampati: I  TM Distance: >3 FB Neck ROM: Full    Dental   Pulmonary    Pulmonary exam normal        Cardiovascular Normal cardiovascular exam     Neuro/Psych    GI/Hepatic GERD  Medicated and Controlled,  Endo/Other  diabetes, Type 2, Insulin Dependent  Renal/GU      Musculoskeletal   Abdominal   Peds  Hematology   Anesthesia Other Findings   Reproductive/Obstetrics                             Anesthesia Physical Anesthesia Plan  ASA: III  Anesthesia Plan: General   Post-op Pain Management:    Induction: Intravenous  Airway Management Planned: LMA  Additional Equipment:   Intra-op Plan:   Post-operative Plan: Extubation in OR  Informed Consent: I have reviewed the patients History and Physical, chart, labs and discussed the procedure including the risks, benefits and alternatives for the proposed anesthesia with the patient or authorized representative who has indicated his/her understanding and acceptance.     Plan Discussed with: CRNA and Surgeon  Anesthesia Plan Comments:         Anesthesia Quick Evaluation

## 2016-10-30 ENCOUNTER — Encounter (HOSPITAL_COMMUNITY): Payer: Self-pay | Admitting: Orthopedic Surgery

## 2016-10-30 DIAGNOSIS — E1169 Type 2 diabetes mellitus with other specified complication: Secondary | ICD-10-CM | POA: Diagnosis not present

## 2016-10-30 LAB — GLUCOSE, CAPILLARY
Glucose-Capillary: 101 mg/dL — ABNORMAL HIGH (ref 65–99)
Glucose-Capillary: 129 mg/dL — ABNORMAL HIGH (ref 65–99)

## 2016-10-30 NOTE — Evaluation (Signed)
Physical Therapy Evaluation Patient Details Name: Travis [name] MRN: [id number] DOB: 07/13/1971 Today's Date: 10/30/2016   History of Present Illness  45 year old gentleman with diabetic insensate neuropathy status post left foot fourth ray amputation about a year ago presents at this time with osteomyelitis ulceration left foot fifth metatarsal head. He underwent L fifth ray amputation 10-29-16.   Clinical Impression  Pt is safe for d/c home today from a mobility standpoint. He is independent with bed mobility and mod I with transfers. Supervision provided for ambulation for safety only. No physical assist needed. No LOB noted. Pt reports no questions or concerns regarding mobility at home. He has all needed DME. No follow-up PT services indicated. PT signing off.    Follow Up Recommendations No PT follow up;Supervision - Intermittent    Equipment Recommendations  None recommended by PT    Recommendations for Other Services       Precautions / Restrictions Precautions Precautions: Fall Required Braces or Orthoses: Other Brace/Splint Other Brace/Splint: post-op shoe Restrictions Weight Bearing Restrictions: Yes LLE Weight Bearing: Touchdown weight bearing      Mobility  Bed Mobility Overal bed mobility: Independent                Transfers Overall transfer level: Modified independent Equipment used: Rolling walker (2 wheeled)             General transfer comment: Pt demo safe technique.  Ambulation/Gait Ambulation/Gait assistance: Supervision Ambulation Distance (Feet): 20 Feet Assistive device: Rolling walker (2 wheeled) Gait Pattern/deviations: Step-to pattern;Antalgic Gait velocity: decreased Gait velocity interpretation: Below normal speed for age/gender General Gait Details: verbal cues for TDWB LLE  Stairs            Wheelchair Mobility    Modified Rankin (Stroke Patients Only)       Balance Overall balance assessment: No apparent  balance deficits (not formally assessed)                                           Pertinent Vitals/Pain Pain Assessment: No/denies pain    Home Living Family/patient expects to be discharged to:: Private residence Living Arrangements: Children Available Help at Discharge: Family;Available 24 hours/day Type of Home: House Home Access: Ramped entrance     Home Layout: One level Home Equipment: Walker - 2 wheels;Wheelchair - manual      Prior Function Level of Independence: Independent               Hand Dominance        Extremity/Trunk Assessment                         Communication   Communication: No difficulties  Cognition Arousal/Alertness: Awake/alert Behavior During Therapy: WFL for tasks assessed/performed Overall Cognitive Status: Within Functional Limits for tasks assessed                      General Comments      Exercises     Assessment/Plan    PT Assessment Patent does not need any further PT services  PT Problem List            PT Treatment Interventions      PT Goals (Current goals can be found in the Care Plan section)  Acute Rehab PT Goals Patient Stated Goal: home today PT Goal Formulation:  All assessment and education complete, DC therapy    Frequency     Barriers to discharge        Co-evaluation               End of Session Equipment Utilized During Treatment: Gait belt;Other (comment) (post-op shoe) Activity Tolerance: Patient tolerated treatment well Patient left: in chair;with call bell/phone within reach Nurse Communication: Mobility status    Functional Assessment Tool Used: clinical judgement Functional Limitation: Mobility: Walking and moving around Mobility: Walking and Moving Around Current Status ([id number]): At least 1 percent but less than 20 percent impaired, limited or restricted Mobility: Walking and Moving Around Goal Status [phone]): At least 1 percent but less  than 20 percent impaired, limited or restricted Mobility: Walking and Moving Around Discharge Status [phone]): At least 1 percent but less than 20 percent impaired, limited or restricted    Time: 0841-0905 PT Time Calculation (min) (ACUTE ONLY): 24 min   Charges:   PT Evaluation $PT Eval Low Complexity: 1 Procedure PT Treatments $Gait Training: 8-22 mins   PT G Codes:   PT G-Codes **NOT FOR INPATIENT CLASS** Functional Assessment Tool Used: clinical judgement Functional Limitation: Mobility: Walking and moving around Mobility: Walking and Moving Around Current Status ([id number]([id number]): At least 1 percent but less than 20 percent impaired, limited or restricted Mobility: Walking and Moving Around Goal Status [phone]): At least 1 percent but less than 20 percent impaired, limited or restricted Mobility: Walking and Moving Around Discharge Status [phone](G8980): At least 1 percent but less than 20 percent impaired, limited or restricted    Ilda FoilGarrow, Travis [name] 10/30/2016, 10:09 AM

## 2016-10-30 NOTE — Care Management Note (Signed)
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 He underwent L fifth ray amputation.                Action/Plan: Received referral to assist with [facility] and DME   Expected Discharge Date:   10/30/16               Expected Discharge Plan:  Home/Self Care  In-House Referral:     Discharge planning Services  CM Consult  Post Acute Care Choice:    Choice offered to:     DME Arranged:    DME Agency:     HH Arranged:    HH Agency:     Status of Service:  Completed, signed off  If discussed at [employer] of Stay Meetings, dates discussed:    Additional Comments: Met with pt at bedside. He has good family support. He has a walker and a W/C. He denies any D/C needs.  Norina Buzzard, RN 10/30/2016, 12:16 PM

## 2016-10-30 NOTE — Progress Notes (Signed)
Discharge instructions and prescription provided to patient and patient's mother.  IV removed.  No questions at time of discharge.

## 2016-10-30 NOTE — Progress Notes (Signed)
Subjective: Patient stable   Objective: Vital signs in last 24 hours: Temp:  [98 F (36.7 C)-98.8 F (37.1 C)] 98 F (36.7 C) (11/18 0437) Pulse Rate:  [78-86] 78 (11/18 0437) Resp:  [12-18] 16 (11/18 0437) BP: (106-124)/(58-70) 106/58 (11/18 0437) SpO2:  [94 %-100 %] 96 % (11/18 0437) Weight:  [200 lb (90.7 kg)] 200 lb (90.7 kg) (11/17 1107)  Intake/Output from previous day: 11/17 0701 - 11/18 0700 In: 1200 [P.O.:300; I.V.:900] Out: 415 [Urine:400; Blood:15] Intake/Output this shift: No intake/output data recorded.  Exam:  No cellulitis present Compartment soft  Labs: No results for input(s): HGB in the last 72 hours. No results for input(s): WBC, RBC, HCT, PLT in the last 72 hours. No results for input(s): NA, K, CL, CO2, BUN, CREATININE, GLUCOSE, CALCIUM in the last 72 hours. No results for input(s): LABPT, INR in the last 72 hours.  Assessment/Plan: Plan discharge today   Burnard BuntingG Scott Kairen Hallinan 10/30/2016, 9:51 AM

## 2016-11-09 ENCOUNTER — Encounter: Payer: Self-pay | Admitting: Infectious Diseases

## 2016-11-09 ENCOUNTER — Ambulatory Visit (INDEPENDENT_AMBULATORY_CARE_PROVIDER_SITE_OTHER): Payer: Medicaid Other | Admitting: Infectious Diseases

## 2016-11-09 VITALS — BP 122/75 | HR 79 | Temp 97.5°F | Ht 74.0 in | Wt 234.0 lb

## 2016-11-09 DIAGNOSIS — E1165 Type 2 diabetes mellitus with hyperglycemia: Secondary | ICD-10-CM

## 2016-11-09 DIAGNOSIS — E1142 Type 2 diabetes mellitus with diabetic polyneuropathy: Secondary | ICD-10-CM | POA: Diagnosis not present

## 2016-11-09 DIAGNOSIS — M86272 Subacute osteomyelitis, left ankle and foot: Secondary | ICD-10-CM

## 2016-11-09 NOTE — Progress Notes (Signed)
   Subjective:    Patient ID: Travis [name], male    DOB: 07-05-1971, 45 y.o.   MRN: [id number]  HPI 45 yo M with hx of DM2 for 15 yrs, with neuropathy and gastroparesis. He developed a L fifth toe ulcer from walking, beginning ~ 5 months prior.   He had bone bx of L 5th MT head on 10-05-16 showing osteomyelitis.  Last A1C was 6.8% (down from 11).  He underwent L 5th ray amputation on 10-29-16. He was d/c home on no anbx. He was on 3 weeks of bactrim prior to surgery.  Had swelling in his leg but no redness or streaking prior to surgery. Had d/c from ulcer (blood and pus). No f/c.  CRP 6.3 (10-25), ESR 24 (10-06-16)  The past medical history, family history and social history were reviewed/updated in EPIC  Has been out of work 1 year now. Has made him depressed.   Review of Systems  Constitutional: Negative for appetite change and unexpected weight change.  Eyes: Positive for visual disturbance.  Gastrointestinal: Negative for constipation and diarrhea.  Genitourinary: Negative for difficulty urinating.  Neurological: Positive for numbness.   Worsening vision since surgery. Last optho 3 weeks prior. Next is Feb. Hx of prev cataracts.     Objective:   Physical Exam  Constitutional: He appears well-developed and well-nourished.  HENT:  Mouth/Throat: No oropharyngeal exudate.  Eyes: EOM are normal. Pupils are equal, round, and reactive to light.  Neck: Neck supple.  Cardiovascular: Normal rate, regular rhythm and normal heart sounds.   Pulmonary/Chest: Effort normal and breath sounds normal.  Abdominal: Soft. Bowel sounds are normal. There is no tenderness. There is no rebound.  Musculoskeletal:       Feet:  Lymphadenopathy:    He has no cervical adenopathy.      Assessment & Plan:

## 2016-11-09 NOTE — Assessment & Plan Note (Signed)
He appears to be doing well.  No path or cx from his most recent surgery.  Will check his CRp and ESR today.  Will see him back in 2-3 weeks.

## 2016-11-09 NOTE — Assessment & Plan Note (Addendum)
He appears to be doing better (as his improved A1C implies). He has numerous complications however.... Refuses flu shot.

## 2016-11-10 LAB — SEDIMENTATION RATE: SED RATE: 1 mm/h (ref 0–15)

## 2016-11-10 LAB — C-REACTIVE PROTEIN: CRP: 0.6 mg/L (ref <8.0)

## 2016-11-12 ENCOUNTER — Ambulatory Visit (INDEPENDENT_AMBULATORY_CARE_PROVIDER_SITE_OTHER): Payer: Medicaid Other | Admitting: Orthopedic Surgery

## 2016-11-12 ENCOUNTER — Inpatient Hospital Stay (INDEPENDENT_AMBULATORY_CARE_PROVIDER_SITE_OTHER): Payer: Medicaid Other | Admitting: Orthopedic Surgery

## 2016-11-12 ENCOUNTER — Encounter (INDEPENDENT_AMBULATORY_CARE_PROVIDER_SITE_OTHER): Payer: Self-pay | Admitting: Orthopedic Surgery

## 2016-11-12 VITALS — Ht 74.0 in | Wt 234.0 lb

## 2016-11-12 DIAGNOSIS — Z89422 Acquired absence of other left toe(s): Secondary | ICD-10-CM

## 2016-11-12 DIAGNOSIS — IMO0002 Reserved for concepts with insufficient information to code with codable children: Secondary | ICD-10-CM

## 2016-11-12 NOTE — Progress Notes (Signed)
Office Visit Note   Patient: Travis [name]           Date of Birth: 08/12/1971           MRN: [id number] Visit Date: 11/12/2016              Requested by: Salli RealYun Sun, MD [address] [place], [place] [address] PCP: Salli RealYun Sun, MD   Assessment & Plan: Visit Diagnoses:  1. Toe amputation status, left [facility](HCC)     Plan: May continue touchdown weightbearing in postop shoe. Cleanse wound daily. Apply dry dressing.  Follow-Up Instructions: Return 1-2 wks.   Orders:  No orders of the defined types were placed in this encounter.  No orders of the defined types were placed in this encounter.     Procedures: No procedures performed   Clinical Data: No additional findings.   Subjective: Chief Complaint  Patient presents with  . Left Foot - Routine Post Op    10/29/16 left foot 5th ray ampuation    Patient is a 45 year old gentleman seen today 2 weeks s/p  A left foot 5th ray dictation. HE is touchdown weight bearing with a walker and a post op shoe.  Patient states that he is feeling well but does have some neuropathic pain. Incision is well approximated and stitches are intact. He did have an appt with ID this week Dr. Ninetta LightsHatcher and patient states that labs were obtained yesterday and they will decide if he needs to be covered with abx.    Review of Systems  Constitutional: Negative for chills and fever.     Objective: Vital Signs: Ht 6\' 2"  (1.88 m)   Wt 234 lb (106.1 kg)   BMI 30.04 kg/m   Physical Exam  Constitutional: He is oriented to person, place, and time. He appears well-developed and well-nourished.  Pulmonary/Chest: Effort normal.  Neurological: He is alert and oriented to person, place, and time.  Skin:  Incision well approximated healing well. Sutures were harvested today in clinic. Dry dressing applied. No drainage no erythema no sign of infection. No swelling to the foot or ankle.  Psychiatric: He has a normal mood and affect.  Nursing note  reviewed.   Ortho Exam  Specialty Comments:  No specialty comments available.  Imaging: No results found.   PMFS History: Patient Active Problem List   Diagnosis Date Noted  . Toe amputation status, left (HCC) 10/29/2016  . Subacute osteomyelitis of left foot (HCC) 10/20/2016  . Abnormality of gait and mobility 10/06/2016  . DKA (diabetic ketoacidoses) (HCC) 05/21/2016  . Nausea & vomiting 05/21/2016  . Ketoacidosis 05/21/2016  . Malnutrition of moderate degree 05/21/2016  . Gastroparesis due to DM (HCC)   . Increased intraocular pressure 04/12/2016  . Post-op bleeding 11/29/2015  . Diabetic ulcer of left foot (HCC) 11/14/2015  . Acute osteomyelitis of metatarsal bone of left foot (HCC), Left 4th metatarsal 11/12/2015  . Vitamin D deficiency 11/12/2015  . Osteoporosis 11/12/2015  . Closed fracture of right tibial plateau 11/11/2015  . Visual disturbance 11/03/2015  . Neuropathy (HCC) 11/03/2015  . Gastroesophageal reflux disease without esophagitis 11/03/2015  . 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 ALLERGIC RHINITIS 12/11/2008  . 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 CERVICALGIA 07/19/2008  . INSOMNIA 08/24/2007  . Dyslipidemia 08/10/2007  . Diabetes type 2, uncontrolled (HCC) 08/09/2007   Past Medical History:  Diagnosis Date  . Acute osteomyelitis of metatarsal bone of left foot (HCC) 11/12/2015  .  Closed fracture of right tibial plateau 11/11/2015  . Depression with anxiety   . Diabetes mellitus    INSULIN DEPENDENT Type II  . Diabetic peripheral neuropathy associated with type 2 diabetes mellitus (HCC) 08/30/2008   Qualifier: Diagnosis of  By: Daphine DeutscherMartin FNP, Zena AmosNykedtra    . Diabetic ulcer of left foot (HCC) 11/14/2015  . Gastroparesis   . GERD (gastroesophageal reflux disease)   . Osteomyelitis (HCC)   . Osteoporosis 11/12/2015  . Peripheral neuropathy (HCC)   . Shortness of breath dyspnea    with  exertion  . Vitamin D deficiency 11/12/2015    Family History  Problem Relation Age of Onset  . Cancer Paternal Grandmother   . Diabetes Mother   . COPD Mother   . Heart failure Mother   . Esophageal cancer Father   . Diabetes Sister   . Diabetes Maternal Uncle   . Diabetes Maternal Grandmother     Past Surgical History:  Procedure Laterality Date  . AMPUTATION Left 11/28/2015   Procedure: Left 4th Ray Amputation;  Surgeon: Nadara MustardMarcus Duda V, MD;  [facility];  Service: Orthopedics;  Laterality: Left;  . AMPUTATION Left 10/29/2016   Procedure: LEFT 5TH RAY AMPUTATION;  Surgeon: Nadara MustardMarcus V Duda, MD;  Location: [facility] OR;  Service: Orthopedics;  Laterality: Left;  . ANTERIOR VITRECTOMY Left 04/12/2016   Procedure: ANTERIOR CHAMBER WASH OUT WITH GAS FLUID EXCHANGE;  Surgeon: Carmela RimaNarendra Patel, MD;  [facility];  Service: Ophthalmology;  Laterality: Left;  . CATARACT EXTRACTION Left   . EYE SURGERY     left eye surgery 04/2016  . KNEE SURGERY Left   . SHOULDER SURGERY Right   . TOE AMPUTATION Left 11/28/15   4th toe   Social History   Occupational History  . Disabled    Social History Main Topics  . Smoking status: Never Smoker  . Smokeless tobacco: Never Used  . Alcohol use No  . Drug use: No  . Sexual activity: Yes

## 2016-11-19 ENCOUNTER — Ambulatory Visit (INDEPENDENT_AMBULATORY_CARE_PROVIDER_SITE_OTHER): Payer: Medicaid Other | Admitting: Orthopedic Surgery

## 2016-11-19 ENCOUNTER — Encounter (INDEPENDENT_AMBULATORY_CARE_PROVIDER_SITE_OTHER): Payer: Self-pay | Admitting: Orthopedic Surgery

## 2016-11-19 VITALS — Ht 74.0 in | Wt 234.0 lb

## 2016-11-19 DIAGNOSIS — M6702 Short Achilles tendon (acquired), left ankle: Secondary | ICD-10-CM | POA: Insufficient documentation

## 2016-11-19 DIAGNOSIS — Z89512 Acquired absence of left leg below knee: Secondary | ICD-10-CM | POA: Insufficient documentation

## 2016-11-19 DIAGNOSIS — IMO0002 Reserved for concepts with insufficient information to code with codable children: Secondary | ICD-10-CM

## 2016-11-19 DIAGNOSIS — Z89432 Acquired absence of left foot: Secondary | ICD-10-CM

## 2016-11-19 NOTE — Progress Notes (Signed)
Office Visit Note   Patient: Travis [name]           Date of Birth: 08/14/1971           MRN: [id number] Visit Date: 11/19/2016              Requested by: Salli RealYun Sun, MD [address] [place], [place] [address] PCP: Salli RealYun Sun, MD   Assessment & Plan: Visit Diagnoses:  1. Foot amputation status, left (HCC)   2. Short Achilles tendon (acquired), left ankle     Plan: Patient is status post fourth and fifth ray amputations left foot. Incisions well-healed he has dry skin. He'll use moisturizing lotion he has Achilles contracture is given instructions for heel cord stretching to do 5 times a day minute at a time this was demonstrated to him. He is given a prescription for biotech for extra-depth shoes custom orthotics spacer on the left.  Follow-Up Instructions: Return in about 4 weeks (around 12/17/2016).   Orders:  No orders of the defined types were placed in this encounter.  No orders of the defined types were placed in this encounter.     Procedures: No procedures performed   Clinical Data: No additional findings.   Subjective: Chief Complaint  Patient presents with  . Left Foot - Routine Post Op    10/29/16 left foot 5th ray ampuation    Pt ambulates with a post op shoe left 4th ray amp. He is well healed dry dressing applied. He feels well no concerns.    Review of Systems   Objective: Vital Signs: Ht 6\' 2"  (1.88 m)   Wt 234 lb (106.1 kg)   BMI 30.04 kg/m   Physical Exam examination patient has an antalgic gait the incisions well-healed there is no redness no synovitis no drainage no signs of infection. He has dorsiflexion about 10 short of neutral with his knee extended.  Ortho Exam  Specialty Comments:  No specialty comments available.  Imaging: No results found.   PMFS History: Patient Active Problem List   Diagnosis Date Noted  . 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 Short Achilles tendon (acquired), left ankle 11/19/2016  .  Toe amputation status, left (HCC) 10/29/2016  . Subacute osteomyelitis of left foot (HCC) 10/20/2016  . Abnormality of gait and mobility 10/06/2016  . DKA (diabetic ketoacidoses) (HCC) 05/21/2016  . Nausea & vomiting 05/21/2016  . Ketoacidosis 05/21/2016  . Malnutrition of moderate degree 05/21/2016  . Gastroparesis due to DM (HCC)   . Increased intraocular pressure 04/12/2016  . Post-op bleeding 11/29/2015  . Diabetic ulcer of left foot (HCC) 11/14/2015  . Acute osteomyelitis of metatarsal bone of left foot (HCC), Left 4th metatarsal 11/12/2015  . Vitamin D deficiency 11/12/2015  . Osteoporosis 11/12/2015  . Closed fracture of right tibial plateau 11/11/2015  . Visual disturbance 11/03/2015  . Neuropathy (HCC) 11/03/2015  . Gastroesophageal reflux disease without esophagitis 11/03/2015  . 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 ALLERGIC RHINITIS 12/11/2008  . 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 CERVICALGIA 07/19/2008  . INSOMNIA 08/24/2007  . Dyslipidemia 08/10/2007  . Diabetes type 2, uncontrolled (HCC) 08/09/2007   Past Medical History:  Diagnosis Date  . Acute osteomyelitis of metatarsal bone of left foot (HCC) 11/12/2015  . Closed fracture of right tibial plateau 11/11/2015  . Depression with anxiety   . Diabetes mellitus    INSULIN DEPENDENT Type II  . Diabetic peripheral neuropathy associated with  type 2 diabetes mellitus (HCC) 08/30/2008   Qualifier: Diagnosis of  By: Daphine DeutscherMartin FNP, Zena AmosNykedtra    . Diabetic ulcer of left foot (HCC) 11/14/2015  . Gastroparesis   . GERD (gastroesophageal reflux disease)   . Osteomyelitis (HCC)   . Osteoporosis 11/12/2015  . Peripheral neuropathy (HCC)   . Shortness of breath dyspnea    with exertion  . Vitamin D deficiency 11/12/2015    Family History  Problem Relation Age of Onset  . Cancer Paternal Grandmother   . Diabetes Mother   . COPD Mother   . Heart failure Mother     . Esophageal cancer Father   . Diabetes Sister   . Diabetes Maternal Uncle   . Diabetes Maternal Grandmother     Past Surgical History:  Procedure Laterality Date  . AMPUTATION Left 11/28/2015   Procedure: Left 4th Ray Amputation;  Surgeon: Nadara MustardMarcus Piper Hassebrock V, MD;  [facility];  Service: Orthopedics;  Laterality: Left;  . AMPUTATION Left 10/29/2016   Procedure: LEFT 5TH RAY AMPUTATION;  Surgeon: Nadara MustardMarcus V Zoila Ditullio, MD;  Location: [facility] OR;  Service: Orthopedics;  Laterality: Left;  . ANTERIOR VITRECTOMY Left 04/12/2016   Procedure: ANTERIOR CHAMBER WASH OUT WITH GAS FLUID EXCHANGE;  Surgeon: Carmela RimaNarendra Patel, MD;  [facility];  Service: Ophthalmology;  Laterality: Left;  . CATARACT EXTRACTION Left   . EYE SURGERY     left eye surgery 04/2016  . KNEE SURGERY Left   . SHOULDER SURGERY Right   . TOE AMPUTATION Left 11/28/15   4th toe   Social History   Occupational History  . Disabled    Social History Main Topics  . Smoking status: Never Smoker  . Smokeless tobacco: Never Used  . Alcohol use No  . Drug use: No  . Sexual activity: Yes

## 2016-11-24 ENCOUNTER — Telehealth (INDEPENDENT_AMBULATORY_CARE_PROVIDER_SITE_OTHER): Payer: Self-pay | Admitting: Orthopedic Surgery

## 2016-11-25 NOTE — Telephone Encounter (Signed)
I called and lm on vm to advise that we are happy to see him in the office and that if there is any area that is of concern he can call the office and make an appt and we can see him at any time.

## 2016-11-26 ENCOUNTER — Encounter: Payer: Medicaid Other | Admitting: Neurology

## 2016-11-26 ENCOUNTER — Ambulatory Visit (INDEPENDENT_AMBULATORY_CARE_PROVIDER_SITE_OTHER): Payer: Medicaid Other | Admitting: Family

## 2016-11-26 DIAGNOSIS — Z89422 Acquired absence of other left toe(s): Secondary | ICD-10-CM

## 2016-11-26 DIAGNOSIS — IMO0002 Reserved for concepts with insufficient information to code with codable children: Secondary | ICD-10-CM

## 2016-11-26 DIAGNOSIS — Z89432 Acquired absence of left foot: Secondary | ICD-10-CM

## 2016-11-26 NOTE — Progress Notes (Signed)
Office Visit Note   Patient: Travis [name]           Date of Birth: 02/12/1971           MRN: [id number] Visit Date: 11/26/2016              Requested by: Salli RealYun Sun, MD [address] [place], [place] [address] PCP: Salli RealYun Sun, MD   Assessment & Plan: Visit Diagnoses:  1. Toe amputation status, left (HCC)   2. Foot amputation status, left (HCC)     Plan: Him continue activities as tolerated. Continue daily foot checks. Follow-up in the office as scheduled. May resume activities as he feels comfortable .  Follow-Up Instructions: Return for as scheduled.   Orders:  No orders of the defined types were placed in this encounter.  No orders of the defined types were placed in this encounter.     Procedures: No procedures performed   Clinical Data: No additional findings.   Subjective: Chief Complaint  Patient presents with  . Left Foot - Follow-up    Travis [name] is a 45 year old gentleman who is seen today for evaluation of callus development up along his shorts and fifth ray amputation. here today stating Dr. Lajoyce Cornersuda told him to come in if he ever noticed callous formation. He states he has noticed this recently. Denies drainage or blood.    Review of Systems  Constitutional: Negative for chills and fever.     Objective: Vital Signs: There were no vitals taken for this visit.  Physical Exam  Constitutional: He is oriented to person, place, and time. He appears well-developed and well-nourished.  Pulmonary/Chest: Effort normal.  Musculoskeletal:  Left foot fourth and fifth ray amputation is well-healed. There is a little bit of callus and dried blood. The callus was debrided with gauze without incident. There are no open areas no erythema and no drainage no sign of infection.  Neurological: He is alert and oriented to person, place, and time.  Psychiatric: He has a normal mood and affect.  Nursing note reviewed.   Ortho Exam  Specialty Comments:  No  specialty comments available.  Imaging: No results found.   PMFS History: Patient Active Problem List   Diagnosis Date Noted  . 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 Short Achilles tendon (acquired), left ankle 11/19/2016  . Subacute osteomyelitis of left foot (HCC) 10/20/2016  . Abnormality of gait and mobility 10/06/2016  . DKA (diabetic ketoacidoses) (HCC) 05/21/2016  . Nausea & vomiting 05/21/2016  . Ketoacidosis 05/21/2016  . Malnutrition of moderate degree 05/21/2016  . Gastroparesis due to DM (HCC)   . Increased intraocular pressure 04/12/2016  . Post-op bleeding 11/29/2015  . Diabetic ulcer of left foot (HCC) 11/14/2015  . Acute osteomyelitis of metatarsal bone of left foot (HCC), Left 4th metatarsal 11/12/2015  . Vitamin D deficiency 11/12/2015  . Osteoporosis 11/12/2015  . Closed fracture of right tibial plateau 11/11/2015  . Visual disturbance 11/03/2015  . Neuropathy (HCC) 11/03/2015  . Gastroesophageal reflux disease without esophagitis 11/03/2015  . 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 ALLERGIC RHINITIS 12/11/2008  . 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 CERVICALGIA 07/19/2008  . INSOMNIA 08/24/2007  . Dyslipidemia 08/10/2007  . Diabetes type 2, uncontrolled (HCC) 08/09/2007   Past Medical History:  Diagnosis Date  . Acute osteomyelitis of metatarsal bone of left foot (HCC) 11/12/2015  . Closed fracture of right tibial plateau 11/11/2015  . Depression with anxiety   .  Diabetes mellitus    INSULIN DEPENDENT Type II  . Diabetic peripheral neuropathy associated with type 2 diabetes mellitus (HCC) 08/30/2008   Qualifier: Diagnosis of  By: Daphine DeutscherMartin FNP, Zena AmosNykedtra    . Diabetic ulcer of left foot (HCC) 11/14/2015  . Gastroparesis   . GERD (gastroesophageal reflux disease)   . Osteomyelitis (HCC)   . Osteoporosis 11/12/2015  . Peripheral neuropathy (HCC)   . Shortness of breath dyspnea     with exertion  . Vitamin D deficiency 11/12/2015    Family History  Problem Relation Age of Onset  . Cancer Paternal Grandmother   . Diabetes Mother   . COPD Mother   . Heart failure Mother   . Esophageal cancer Father   . Diabetes Sister   . Diabetes Maternal Uncle   . Diabetes Maternal Grandmother     Past Surgical History:  Procedure Laterality Date  . AMPUTATION Left 11/28/2015   Procedure: Left 4th Ray Amputation;  Surgeon: Nadara MustardMarcus Duda V, MD;  [facility];  Service: Orthopedics;  Laterality: Left;  . AMPUTATION Left 10/29/2016   Procedure: LEFT 5TH RAY AMPUTATION;  Surgeon: Nadara MustardMarcus V Duda, MD;  Location: [facility] OR;  Service: Orthopedics;  Laterality: Left;  . ANTERIOR VITRECTOMY Left 04/12/2016   Procedure: ANTERIOR CHAMBER WASH OUT WITH GAS FLUID EXCHANGE;  Surgeon: Carmela RimaNarendra Patel, MD;  [facility];  Service: Ophthalmology;  Laterality: Left;  . CATARACT EXTRACTION Left   . EYE SURGERY     left eye surgery 04/2016  . KNEE SURGERY Left   . SHOULDER SURGERY Right   . TOE AMPUTATION Left 11/28/15   4th toe   Social History   Occupational History  . Disabled    Social History Main Topics  . Smoking status: Never Smoker  . Smokeless tobacco: Never Used  . Alcohol use No  . Drug use: No  . Sexual activity: Yes

## 2016-11-30 ENCOUNTER — Ambulatory Visit (INDEPENDENT_AMBULATORY_CARE_PROVIDER_SITE_OTHER): Payer: Medicaid Other | Admitting: Infectious Diseases

## 2016-11-30 ENCOUNTER — Encounter: Payer: Self-pay | Admitting: Infectious Diseases

## 2016-11-30 DIAGNOSIS — Z89432 Acquired absence of left foot: Secondary | ICD-10-CM | POA: Diagnosis not present

## 2016-11-30 DIAGNOSIS — IMO0002 Reserved for concepts with insufficient information to code with codable children: Secondary | ICD-10-CM

## 2016-11-30 NOTE — Progress Notes (Signed)
   Subjective:    Patient ID: Travis [name], male    DOB: 1971-05-22, 45 y.o.   MRN: [id number]  HPI 45 yo M with hx of DM2 for 15 yrs, with neuropathy and gastroparesis. L 4th ray amputation 10-2015.  He developed a L fifth toe ulcer from walking, beginning ~ May 2017.   He had bone bx of L 5th MT head on 10-05-16 showing osteomyelitis.  Last A1C was 7.1% (down from 11).  He underwent L 5th ray amputation on 10-29-16. He was d/c home on no anbx. He was on 3 weeks of bactrim prior to surgery.  Had swelling in his leg but no redness or streaking prior to surgery. Had d/c from ulcer (blood and pus). No f/c.  CRP 6.3 (10-25), ESR 24 (10-06-16) He was seen in f/u on 11-28 and was doing well  He is currently off anbx.  CRP 0.6  (11-09-16) ESR 1 (11-09-16)  States his A1C is up because he has only been able to sit around and eat.  Wound is completely healed.   Review of Systems  Constitutional: Negative for appetite change and unexpected weight change.  Gastrointestinal: Negative for constipation and diarrhea.  Genitourinary: Negative for difficulty urinating.  He has no feeling in his foot.     Objective:   Physical Exam  Constitutional: He appears well-developed and well-nourished.  Musculoskeletal:       Feet:      Assessment & Plan:

## 2016-11-30 NOTE — Assessment & Plan Note (Signed)
His wound is well healed. He is doing well.  His inflammatory markers are normal.  Will see him back prn.  Asked him to call if erythema, worsening of wound.

## 2016-12-14 ENCOUNTER — Telehealth (INDEPENDENT_AMBULATORY_CARE_PROVIDER_SITE_OTHER): Payer: Self-pay | Admitting: *Deleted

## 2016-12-14 NOTE — Telephone Encounter (Signed)
Pt called asking if he still needed to keep his appt on 1/4. He stated he came in already for an appt but thought he was cleared but wasn't sure. Pt requesting call back [phone]

## 2016-12-14 NOTE — Telephone Encounter (Signed)
Called and lm on vm to advise that he should keep his last appt and have Dr. Lajoyce Cornersuda give him his last check and then he can follow up as needed. To call with questions.

## 2016-12-16 ENCOUNTER — Encounter (INDEPENDENT_AMBULATORY_CARE_PROVIDER_SITE_OTHER): Payer: Self-pay | Admitting: Orthopedic Surgery

## 2016-12-16 ENCOUNTER — Ambulatory Visit (INDEPENDENT_AMBULATORY_CARE_PROVIDER_SITE_OTHER): Payer: Medicaid Other | Admitting: Orthopedic Surgery

## 2016-12-16 VITALS — Ht 74.0 in | Wt 242.0 lb

## 2016-12-16 DIAGNOSIS — IMO0002 Reserved for concepts with insufficient information to code with codable children: Secondary | ICD-10-CM

## 2016-12-16 DIAGNOSIS — Z89432 Acquired absence of left foot: Secondary | ICD-10-CM

## 2016-12-16 NOTE — Progress Notes (Signed)
Office Visit Note   Patient: Travis [name]           Date of Birth: 09/17/1971           MRN: [id number] Visit Date: 12/16/2016              Requested by: Salli RealYun Sun, MD [address] [place], [place] [address] PCP: Salli RealYun Sun, MD  Chief Complaint  Patient presents with  . Left Foot - Follow-up    History of callus and ulcer    HPI: Pt states that he is feeling well he does not have any questions or concerns and that he is ready to be released to follow up as needed. Autumn L Forrest, RMA  Is a 46 year old gentleman who is seen today in follow-up as post a him fourth and fifth ray amputation of the left foot. This is healed well. He is ambulating in regular shoe wear without any issues. No pain issues  Assessment & Plan: Visit Diagnoses:  1. Foot amputation status, left (HCC)     Plan: Follow-up in office as needed  Follow-Up Instructions: Return if symptoms worsen or fail to improve.   Physical Exam: Patient is alert and oriented. No adenopathy. Well-dressed. Normal affect. Respirations easy. Steady gait. Left foot incision is well-healed. No open areas no erythema and no drainage no sign of infection. Him no tenderness  Imaging: No results found.  Orders:  No orders of the defined types were placed in this encounter.  No orders of the defined types were placed in this encounter.    Procedures: No procedures performed  Clinical Data: No additional findings.  Subjective: Review of Systems  Constitutional: Negative for chills and fever.  Musculoskeletal: Negative for gait problem.  Skin: Negative for wound.    Objective: Vital Signs: Ht 6\' 2"  (1.88 m)   Wt 242 lb (109.8 kg)   BMI 31.07 kg/m   Specialty Comments:  No specialty comments available.  PMFS History: Patient Active Problem List   Diagnosis Date Noted  . 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 Short Achilles tendon (acquired), left ankle 11/19/2016  . Subacute osteomyelitis of  left foot (HCC) 10/20/2016  . Abnormality of gait and mobility 10/06/2016  . DKA (diabetic ketoacidoses) (HCC) 05/21/2016  . Nausea & vomiting 05/21/2016  . Ketoacidosis 05/21/2016  . Malnutrition of moderate degree 05/21/2016  . Gastroparesis due to DM (HCC)   . Increased intraocular pressure 04/12/2016  . Post-op bleeding 11/29/2015  . Diabetic ulcer of left foot (HCC) 11/14/2015  . Acute osteomyelitis of metatarsal bone of left foot (HCC), Left 4th metatarsal 11/12/2015  . Vitamin D deficiency 11/12/2015  . Osteoporosis 11/12/2015  . Closed fracture of right tibial plateau 11/11/2015  . Visual disturbance 11/03/2015  . Neuropathy (HCC) 11/03/2015  . Gastroesophageal reflux disease without esophagitis 11/03/2015  . 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 ALLERGIC RHINITIS 12/11/2008  . 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 CERVICALGIA 07/19/2008  . INSOMNIA 08/24/2007  . Dyslipidemia 08/10/2007  . Diabetes type 2, uncontrolled (HCC) 08/09/2007   Past Medical History:  Diagnosis Date  . Acute osteomyelitis of metatarsal bone of left foot (HCC) 11/12/2015  . Closed fracture of right tibial plateau 11/11/2015  . Depression with anxiety   . Diabetes mellitus    INSULIN DEPENDENT Type II  . Diabetic peripheral neuropathy associated with type 2 diabetes mellitus (HCC) 08/30/2008   Qualifier: Diagnosis of  By: Daphine DeutscherMartin FNP, Zena AmosNykedtra    .  Diabetic ulcer of left foot (HCC) 11/14/2015  . Gastroparesis   . GERD (gastroesophageal reflux disease)   . Osteomyelitis (HCC)   . Osteoporosis 11/12/2015  . Peripheral neuropathy (HCC)   . Shortness of breath dyspnea    with exertion  . Vitamin D deficiency 11/12/2015    Family History  Problem Relation Age of Onset  . Cancer Paternal Grandmother   . Diabetes Mother   . COPD Mother   . Heart failure Mother   . Esophageal cancer Father   . Diabetes Sister   . Diabetes Maternal  Uncle   . Diabetes Maternal Grandmother     Past Surgical History:  Procedure Laterality Date  . AMPUTATION Left 11/28/2015   Procedure: Left 4th Ray Amputation;  Surgeon: Nadara Mustard, MD;  Location[facility] OR;  Service: Orthopedics;  Laterality: Left;  . AMPUTATION Left 10/29/2016   Procedure: LEFT 5TH RAY AMPUTATION;  Surgeon: Nadara Mustard, MD;  Location: [facility] OR;  Service: Orthopedics;  Laterality: Left;  . ANTERIOR VITRECTOMY Left 04/12/2016   Procedure: ANTERIOR CHAMBER WASH OUT WITH GAS FLUID EXCHANGE;  Surgeon: Carmela Rima, MD;  Location: [facility] OR;  Service: Ophthalmology;  Laterality: Left;  . CATARACT EXTRACTION Left   . EYE SURGERY     left eye surgery 04/2016  . KNEE SURGERY Left   . SHOULDER SURGERY Right   . TOE AMPUTATION Left 11/28/15   4th toe   Social History   Occupational History  . Disabled    Social History Main Topics  . Smoking status: Never Smoker  . Smokeless tobacco: Never Used  . Alcohol use No  . Drug use: No  . Sexual activity: Yes

## 2016-12-31 ENCOUNTER — Encounter: Payer: Medicaid Other | Admitting: Neurology

## 2017-01-03 ENCOUNTER — Encounter: Payer: Self-pay | Admitting: Neurology

## 2017-02-18 ENCOUNTER — Encounter (INDEPENDENT_AMBULATORY_CARE_PROVIDER_SITE_OTHER): Payer: Self-pay

## 2017-02-18 ENCOUNTER — Ambulatory Visit (INDEPENDENT_AMBULATORY_CARE_PROVIDER_SITE_OTHER): Payer: Medicaid Other | Admitting: Neurology

## 2017-02-18 DIAGNOSIS — E1142 Type 2 diabetes mellitus with diabetic polyneuropathy: Secondary | ICD-10-CM

## 2017-02-18 DIAGNOSIS — Z89432 Acquired absence of left foot: Secondary | ICD-10-CM

## 2017-02-18 DIAGNOSIS — IMO0002 Reserved for concepts with insufficient information to code with codable children: Secondary | ICD-10-CM

## 2017-02-18 DIAGNOSIS — Z0289 Encounter for other administrative examinations: Secondary | ICD-10-CM

## 2017-02-18 DIAGNOSIS — R269 Unspecified abnormalities of gait and mobility: Secondary | ICD-10-CM | POA: Diagnosis not present

## 2017-02-18 DIAGNOSIS — G629 Polyneuropathy, unspecified: Secondary | ICD-10-CM

## 2017-02-18 NOTE — Patient Instructions (Signed)
[facility]   [phone] [employer] [place] center [place] Address: [address], [place], [place] [address] Hours: Open  Closes 8PM Phone: [phone]  Vit B complex

## 2017-02-18 NOTE — Progress Notes (Signed)
PATIENT: Travis [name] DOB: 1971/05/19  No chief complaint on file.    HISTORICAL  Travis [name] is a 46 years old right-handed male, accompanied by his sister Travis [name], seen in refer by his primary care physician Dr. Sandi Mariscal for evaluation of severe peripheral neuropathy, initial evaluation was October 06 2016.  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  Around 2009, concurrent with his poorly controlled diabetes, he began to notice bilateral feet paresthesia, starting at the bottom of his feet, over 2 years span, ascending to top of his foot, ankle, distal leg, around 2012, he also noticed bilateral fingertips involvement, numbness tingling shooting pain, for a while, he reported A1c was 11.  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  He is now on regular insulin treatment, diabetes was under much better control, A1c in June 2017 that was 8, sister reported most recent A1c was 6.8.  He now has constant bilateral upper and lower extremity neuropathic pain, numb tingly burning deep achy pain, since 2014, he also noticed gradual onset balance issues, he could not feel the ground, has weak ankles, gradually getting worse. He also has lost dextrality of bilateral fingers, could not button his shirt, difficulty opening up a package.  He denies significant neck low back pain, no bowel bladder incontinence.  I reviewed laboratory evaluation in June 2017, glucose 148, creatinine 0.65, CBC showed mild anemia hemoglobin of 11.2, negative troponin, UDS was positive for marijuana, A1c in  May 2017 was 8.1,  Update March ninth 2018: I reviewed laboratory evaluation, ESR 1, C-reactive protein 0.6, protein electrophoresis was within normal limit, CPK was normal 90, folic acid, vitamin Y64 544, negative HIV, RPR,  He can return for electrodiagnostic study today, which has demonstrated severe axonal peripheral neuropathy, consistent with severe diabetic peripheral neuropathy.  He complains of significant gait abnormality, constant neuropathic pain involving bilateral upper and lower extremity, gabapentin only provide mild help.  Review of System: review of systems performed and notable only for fatigue, lack of stamina  ALLERGIES: Allergies  Allergen Reactions  . No Known Allergies     HOME MEDICATIONS: Current Outpatient Prescriptions  Medication Sig Dispense Refill  . gabapentin (NEURONTIN) 600 MG tablet Take 1,800 mg by mouth at bedtime.    Marland Kitchen HYDROcodone-acetaminophen (NORCO/VICODIN) 5-325 MG tablet Take 1 tablet by mouth every 4 (four) hours as needed for moderate pain. 60 tablet 0  . insulin aspart (NOVOLOG) 100 UNIT/ML FlexPen Inject 0-15 Units into the skin 4 (four) times daily -  with meals and at bedtime. Dose based on blood sugars taken before meals&bedtime. 15 mL 3  . insulin detemir (LEVEMIR) 100 UNIT/ML injection Inject 0.2 mLs (20 Units total) into the skin at bedtime. 30 mL 3  . metFORMIN (GLUCOPHAGE) 1000 MG tablet Take 1,000 mg by mouth 2 (two) times daily.    . metoCLOPramide (REGLAN) 10 MG tablet Take 1 tablet (10 mg total) by mouth 4 (four) times daily -  before meals and at bedtime. 90 tablet 1   No current facility-administered medications for this visit.     PAST MEDICAL HISTORY: Past Medical History:  Diagnosis Date  . Acute osteomyelitis of metatarsal bone of left foot ([place]) 11/12/2015  . Closed fracture of right tibial plateau 11/11/2015  . Depression with anxiety   . Diabetes mellitus    INSULIN DEPENDENT Type II  . Diabetic peripheral  neuropathy associated with type 2 diabetes mellitus ([place]) 08/30/2008   Qualifier: Diagnosis of  By: [name] Done FNP, Tori Milks    . Diabetic ulcer of left foot ([place]) 11/14/2015  . Gastroparesis   . GERD (gastroesophageal reflux disease)   . Osteomyelitis ([place])   . Osteoporosis 11/12/2015  . Peripheral neuropathy ([place])   . Shortness of breath dyspnea    with exertion  . Vitamin D deficiency 11/12/2015    PAST SURGICAL HISTORY: Past Surgical History:  Procedure Laterality Date  . AMPUTATION Left 11/28/2015   Procedure: Left 4th Ray Amputation;  Surgeon: [name], MD;  Location: [place] [facility];  Service: Orthopedics;  Laterality: Left;  . AMPUTATION Left 10/29/2016   Procedure: LEFT 5TH RAY AMPUTATION;  Surgeon: [name], MD;  Location: [place];  Service: Orthopedics;  Laterality: Left;  . ANTERIOR VITRECTOMY Left 04/12/2016   Procedure: ANTERIOR CHAMBER [place] OUT WITH GAS FLUID EXCHANGE;  Surgeon: [name], MD;  Location: [facility];  Service: Ophthalmology;  Laterality: Left;  . CATARACT EXTRACTION Left   . EYE SURGERY     left eye surgery 04/2016  . KNEE SURGERY Left   . SHOULDER SURGERY Right   . TOE AMPUTATION Left 11/28/15   4th toe    FAMILY HISTORY: Family History  Problem Relation Age of Onset  . Cancer Paternal Grandmother   . Diabetes Mother   . COPD Mother   . Heart failure Mother   . Esophageal cancer Father   . Diabetes Sister   . Diabetes Maternal Uncle   . Diabetes Maternal Grandmother     SOCIAL HISTORY:  Social History   Social History  . Marital status: Divorced    Spouse name: N/A  . Number of children: 3  . Years of education: HS   Occupational History  . Disabled    Social History Main Topics  . Smoking status: Never Smoker  . Smokeless tobacco: Never Used  . Alcohol use No  . Drug use: No  . Sexual activity: Yes   Other Topics Concern  . Not on file   Social History Narrative   Lives at home with his children.   Right-handed.   No caffeine  use.     PHYSICAL EXAM   There were no vitals filed for this visit.  Not recorded      There is no height or weight on file to calculate BMI.  PHYSICAL EXAMNIATION:  Gen: NAD, conversant, well nourised, obese, well groomed                     Cardiovascular: Regular rate rhythm, no peripheral edema, warm, nontender. Eyes: Conjunctivae clear without exudates or hemorrhage Neck: Supple, no carotid bruits. Pulmonary: Clear to auscultation bilaterally   NEUROLOGICAL EXAM:  MENTAL STATUS: Speech:    Speech is normal; fluent and spontaneous with normal comprehension.  Cognition:     Orientation to time, place and person     Normal recent and remote memory     Normal Attention span and concentration     Normal Language, naming, repeating,spontaneous speech     Fund of knowledge   CRANIAL NERVES: CN II: Visual fields are full to confrontation.  Pupils are round  equal and briskly reactive to light. CN III, IV, VI: extraocular movement are normal. No ptosis. CN V: Facial sensation is intact to pinprick in all 3 divisions bilaterally. Corneal responses are intact.  CN VII: Face is symmetric with normal eye closure and smile. CN VIII: Hearing is normal to rubbing fingers CN IX, X: Palate elevates symmetrically. Phonation is normal. CN XI: Head turning and shoulder shrug are intact CN XII: Tongue is midline with normal movements and no atrophy.  MOTOR: He has significant bilateral intrinsic hand muscle, distal leg and foot muscle atrophy, there was no significant proximal upper and lower extremity weakness, he has moderate bilateral finger abduction, grip weakness, mild bilateral wrist flexion/extension weakness, moderate bilateral ankle dorsiflexion, plantar flexion weakness, status post left fourth and fifth toes amputation,   REFLEXES: Reflexes are  absent  and symmetric at the biceps, triceps, knees, and ankles. Plantar responses are flexor.  SENSORY: Length dependent  decreased to light touch pinprick and vibratory sensation to knee level, and midforearm  COORDINATION: Rapid alternating movements and fine finger movements are intact. There is no dysmetria on finger-to-nose and heel-knee-shin.    GAIT/STANCE: He needs to push up to get up from seated position, bilateral flailed foot, unsteady wide-based gait  DIAGNOSTIC DATA (LABS, IMAGING, TESTING) - I reviewed patient records, labs, notes, testing and imaging myself where available.   ASSESSMENT AND PLAN  Travis [name] is a 46 y.o. male   Diabetes, insulin-dependent,  Previously poorly controlled, noise better Severe diabetic peripheral neuropathy  No other etiology found based on extensive laboratory evaluations.  I have advised him tight control of his diabetes, vitamin B complex Gait abnormality  Refer him to physical therapy  Potential ankle brace Chronic neuropathic pain, insomnia  Gabapentin 300 mg titrating up to 3 tablets every night  Marcial Pacas, M.D. Ph.D.  [facility] [address] [place], [place] [address] Ph: [phone] Fax: [phone]  CC: Referring Provider

## 2017-02-18 NOTE — Procedures (Signed)
Full Name: Travis [name] Gender: Male MRN #: [id number] Date of Birth: 07/28/1971    Visit Date: 02/18/2017 08:47 Age: [age] Years 8 Months Old Examining Physician: Levert FeinsteinYijun Elnita Surprenant, MD  Referring Physician: Terrace ArabiaYan, MD History: 46 year old male with previous history of poorly controlled diabetes, presenting with few years history of progressive worsening bilateral upper and lower extremity neuropathic pain, weakness, gait abnormality.  Summary of the tests:  Nerve conduction study:   Bilateral radial, median, ulnar, sural, superficial sensory responses were absent.  Bilateral median motor responses showed moderately prolonged distal latency, mild slow conduction velocity, moderate to severely prolonged F-wave latency, with well-preserved C map amplitude.  Bilateral ulnar motor responses showed severely decreased C map amplitude.  Bilateral peroneal, tibial motor responses were absent.  Electromyography:  Selected needle examination was performed at bilateral lower extremity muscles, bilateral lumbar sacral paraspinal muscles; left upper extremity and left cervical paraspinal muscles.  There was evidence of chronic neuropathic changes at bilateral distal lower extremity muscles, left upper extremity distal muscles, mild chronic neuropathic changes at bilateral lumbar sacral, and left cervical paraspinal muscles.  Conclusion: This is a marked abnormal study. There is electrodiagnostic evidence of severe axonal length-dependent sensorimotor polyneuropathy, consistent with history of poorly controlled diabetes in the past.   ------------------------------- Levert FeinsteinYijun Nello Corro, M.D.  [facility] [address] [place], [place] [address] Tel: [phone] Fax: [phone]        [facility]    Nerve / Sites Rec. Site Peak Lat Ref. Amp.1-2 Ref. Distance    ms ms V V cm  R Radial - Anatomical snuff box (Forearm)     Forearm Wrist NR ?2.90 NR ?15.0 10  L Radial - Anatomical snuff  box (Forearm)     Forearm Wrist NR ?2.90 NR ?15.0 10  R Sural - Ankle (Calf)     Calf Ankle NR ?4.40 NR ?6.0 14  L Sural - Ankle (Calf)     Calf Ankle NR ?4.40 NR ?6.0 14  R Superficial peroneal - Ankle     Lat leg Ankle NR ?4.40 NR ?6.0 14  L Superficial peroneal - Ankle     Lat leg Ankle NR ?4.40 NR ?6.0 14  R Median - Orthodromic (Dig II, Mid palm)     Dig II Wrist NR ?3.40 NR ?10.0 13  L Median - Orthodromic (Dig II, Mid palm)     Dig II Wrist NR ?3.40 NR ?10.0 13  R Ulnar - Orthodromic, (Dig V, Mid palm)     Dig V Wrist NR ?3.10 NR ?5.0 11  L Ulnar - Orthodromic, (Dig V, Mid palm)     Dig V Wrist NR ?3.10 NR ?5.0 11     [facility]    Nerve / Sites Muscle Latency Ref. Amplitude Ref. Rel Amp Segments Distance Lat Diff Velocity Ref. Area    ms ms mV mV %  cm ms m/s m/s mVms  R Median - APB     Wrist APB 5.6 ?4.4 7.1 ?4.0 100 Wrist - APB 7    29.3     Upper arm APB 12.4  6.8  95.6 Upper arm - Wrist 25 6.9 36  28.5  L Median - APB     Wrist APB 5.5 ?4.4 6.5 ?4.0 100 Wrist - APB 7    23.1     Upper arm APB 12.2  5.3  81.8 Upper arm - Wrist 25 6.7 37  18.4  R Ulnar - ADM  Wrist ADM 4.3 ?3.3 0.5 ?6.0 100 Wrist - ADM 7    1.3     B.Elbow ADM 10.4  0.6  130 B.Elbow - Wrist 21 6.1 34 ?49 1.9     A.Elbow ADM 14.1  0.3  55.8 A.Elbow - B.Elbow 11 3.7 30 ?49 2.0         A.Elbow - Wrist  9.8     L Ulnar - ADM     Wrist ADM 4.4 ?3.3 0.4 ?6.0 100 Wrist - ADM 7    1.5     B.Elbow ADM NR  NR  NR B.Elbow - Wrist  NR  ?49 NR     A.Elbow ADM NR  NR  NR A.Elbow - B.Elbow  NR  ?49 NR         A.Elbow - Wrist  NR     R Peroneal - EDB     Ankle EDB NR ?6.5 NR ?2.0 NR Ankle - EDB 9    NR     Fib head EDB NR  NR  NR Fib head - Ankle  NR  ?44 NR         Pop fossa - Ankle  NR     L Peroneal - EDB     Ankle EDB NR ?6.5 NR ?2.0 NR Ankle - EDB 9    NR     Fib head EDB NR  NR  NR Fib head - Ankle  NR  ?44 NR         Pop fossa - Ankle  NR     R Tibial - AH     Ankle AH NR ?5.8 NR ?4.0 NR Ankle - AH 9     NR  L Tibial - AH     Ankle AH NR ?5.8 NR ?4.0 NR Ankle - AH 9    NR     F  Wave    Nerve F Lat Ref.   ms ms  R Median - APB 40.7 ?31.0  L Median - APB 39.0 ?31.0     EMG full       EMG Summary Table    Spontaneous MUAP Recruitment  Muscle IA Fib PSW Fasc Other Amp Dur. Poly Pattern  R. Tibialis anterior Increased 1+ None None _______ Increased Increased Normal Reduced  R. Gastrocnemius (Medial head) Increased 1+ None None _______ Increased Increased Normal Reduced  R. Vastus lateralis Increased None None None _______ Normal Normal 1+ Normal  R. Abductor hallucis Increased 1+ None None _______ Increased Increased 1+ Reduced  L. Abductor hallucis Increased 1+ None None _______ Increased Increased 1+ Reduced  L. Tibialis anterior Increased 1+ None None _______ Increased Increased Normal Reduced  L. Tibialis posterior Increased 1+ None None _______ Increased Normal 1+ Reduced  L. Vastus lateralis Increased None None None _______ Normal Normal 1+ Normal  L. First dorsal interosseous Increased 1+ None None _______ Increased Increased 1+ Reduced  L. Extensor digitorum communis Increased None None None _______ Increased Increased 1+ Reduced  L. Deltoid Increased None None None _______ Normal Normal 1+ Normal  L. Biceps brachii Increased None None None _______ Normal Normal 1+ Normal  L. Lumbar paraspinals (low) Increased 1+ None None _______ Normal Normal Normal Normal  L. Lumbar paraspinals (mid) Increased 1+ None None _______ Normal Normal Normal Normal  R. Lumbar paraspinals (mid) Increased 1+ None None _______ Normal Normal Normal Normal  R. Lumbar paraspinals (low) Increased 1+ None None _______ Normal Normal Normal Normal  R.  Cervical paraspinals Increased None None None _______ Normal Normal Normal Normal

## 2017-03-02 ENCOUNTER — Ambulatory Visit: Payer: No Typology Code available for payment source | Admitting: Rehabilitative and Restorative Service Providers"

## 2017-03-14 ENCOUNTER — Ambulatory Visit: Payer: Medicaid Other | Attending: Neurology | Admitting: Rehabilitation

## 2017-03-14 ENCOUNTER — Encounter: Payer: Self-pay | Admitting: Rehabilitation

## 2017-03-14 DIAGNOSIS — R2681 Unsteadiness on feet: Secondary | ICD-10-CM | POA: Diagnosis present

## 2017-03-14 DIAGNOSIS — R208 Other disturbances of skin sensation: Secondary | ICD-10-CM | POA: Diagnosis present

## 2017-03-14 DIAGNOSIS — R2689 Other abnormalities of gait and mobility: Secondary | ICD-10-CM

## 2017-03-14 DIAGNOSIS — M6281 Muscle weakness (generalized): Secondary | ICD-10-CM

## 2017-03-14 NOTE — Therapy (Signed)
[facility] [address] [place], [place], [address] Phone: [phone]   Fax:  [phone]  Physical Therapy Evaluation  Patient Details  Name: Travis [name] MRN: [id number] Date of Birth: 1971-01-28 Referring Provider: Levert Feinstein, MD  Encounter Date: 03/14/2017      PT End of Session - 03/14/17 0943    Visit Number 1   Number of Visits 4   Date for PT Re-Evaluation 06/12/17   Authorization Type MCD-awaiting approval   PT Start Time 0810  Pt arrived early, PT needing time to chair review   PT Stop Time 0846   PT Time Calculation (min) 36 min   Activity Tolerance Patient tolerated treatment well   Behavior During Therapy [facility] for tasks assessed/performed      Past Medical History:  Diagnosis Date  . Acute osteomyelitis of metatarsal bone of left foot (HCC) 11/12/2015  . Closed fracture of right tibial plateau 11/11/2015  . Depression with anxiety   . Diabetes mellitus    INSULIN DEPENDENT Type II  . Diabetic peripheral neuropathy associated with type 2 diabetes mellitus (HCC) 08/30/2008   Qualifier: Diagnosis of  By: Daphine Deutscher FNP, Zena Amos    . Diabetic ulcer of left foot (HCC) 11/14/2015  . Gastroparesis   . GERD (gastroesophageal reflux disease)   . Osteomyelitis (HCC)   . Osteoporosis 11/12/2015  . Peripheral neuropathy (HCC)   . Shortness of breath dyspnea    with exertion  . Vitamin D deficiency 11/12/2015    Past Surgical History:  Procedure Laterality Date  . AMPUTATION Left 11/28/2015   Procedure: Left 4th Ray Amputation;  Surgeon: Nadara Mustard, MD;  Location: [facility] OR;  Service: Orthopedics;  Laterality: Left;  . AMPUTATION Left 10/29/2016   Procedure: LEFT 5TH RAY AMPUTATION;  Surgeon: Nadara Mustard, MD;  Location: [facility] OR;  Service: Orthopedics;  Laterality: Left;  . ANTERIOR VITRECTOMY Left 04/12/2016   Procedure: ANTERIOR CHAMBER WASH OUT WITH GAS FLUID EXCHANGE;  Surgeon: Carmela Rima, MD;   Location: [facility] OR;  Service: Ophthalmology;  Laterality: Left;  . CATARACT EXTRACTION Left   . EYE SURGERY     left eye surgery 04/2016  . KNEE SURGERY Left   . SHOULDER SURGERY Right   . TOE AMPUTATION Left 11/28/15   4th toe    There were no vitals filed for this visit.       Subjective Assessment - 03/14/17 0812    Subjective Pt presents s/p L 4th and 5th ray amputations.  Would like to work on balance, strength in LEs and motor coordination in my arms.  Reports is not able to use hands as well as he used to.     Patient is accompained by: Family member  [name], sister   Currently in Pain? Yes   Pain Score 3    Pain Location Hand  B hands and sometimes in feet   Pain Orientation Left;Right   Pain Descriptors / Indicators Burning;Shooting;Tightness   Pain Type Chronic pain   Pain Onset More than a month ago   Pain Frequency Constant   Pain Relieving Factors stretching            [facility] PT Assessment - 03/14/17 0001      Assessment   Medical Diagnosis gait and balance due to neuropathy   Referring Provider Levert Feinstein, MD   Onset Date/Surgical Date --  Notes decreased balance in last 3 years     Precautions   Precautions Fall  Precaution Comments Note recent L 4th and 5th ray ampuatation     Restrictions   Weight Bearing Restrictions No     Balance Screen   Has the patient fallen in the past 6 months Yes   How many times? 20  near falls   Has the patient had a decrease in activity level because of a fear of falling?  Yes   Is the patient reluctant to leave their home because of a fear of falling?  Yes     Home Environment   Living Environment Private residence   Living Arrangements Alone;Children   Available Help at Discharge Family  sister helps out as well   Type of Home House   Home Access Stairs to enter   Entrance Stairs-Number of Steps 4   Entrance Stairs-Rails Can reach both;Right;Left  ramp in back entrance   Home Layout One level   Home  Equipment Wheelchair - manual;Walker - 2 wheels;Shower seat     Prior Function   Level of Independence Independent with basic ADLs  needs some assistance with buttons (pants and shirts)   Vocation Part time employment   Vocation Requirements delivering pizza (Fri, Sat, 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   Leisure used to like to play basketball, travel     Cognition   Overall Cognitive Status Within Functional Limits for tasks assessed     Sensation   Light Touch Impaired Detail   Light Touch Impaired Details Impaired RUE;Impaired LUE;Impaired RLE;Impaired LLE  in BLEs from knees down   Hot/Cold Impaired Detail   Hot/Cold Impaired Details Impaired RUE;Impaired LUE;Impaired RLE;Impaired LLE     Coordination   Gross Motor Movements are Fluid and Coordinated No   Fine Motor Movements are Fluid and Coordinated No     Posture/Postural Control   Posture/Postural Control Postural limitations   Postural Limitations Rounded Shoulders;Forward head;Flexed trunk     ROM / Strength   AROM / PROM / Strength Strength     Strength   Overall Strength Deficits   Overall Strength Comments R hip flex 3-/5 (sitting), L hip flex 3+/5 (sitting), R knee ext 4/5, L knee ext 3+/5, L knee flex 3+/5, R knee flex 4/5, R ankle DF 3-/5, R ankle PF 4/5, L  ankle DF 3-/5, L ankle PF 3+/5     Transfers   Transfers Sit to Stand;Stand to Sit   Sit to Stand 6: Modified independent (Device/Increase time)   Five time sit to stand comments  36.12 secs w/o UE support   Stand to Sit 6: Modified independent (Device/Increase time)     Ambulation/Gait   Ambulation/Gait Yes   Ambulation/Gait Assistance 5: Supervision   Ambulation/Gait Assistance Details Note unsteady gait, esp with turns and also note that he tends to demonstrate L hip hike due to L ankle weakness to ensure that foot clears.    Ambulation Distance (Feet) 115 Feet   Assistive device None   Gait Pattern Step-through pattern;Decreased stride length;Decreased  dorsiflexion - left;Decreased weight shift to left;Left steppage;Lateral hip instability;Trunk flexed;Poor foot clearance - left   Ambulation Surface Level;Indoor   Gait velocity 3.23 ft/sec at S level    Stairs Yes   Stairs Assistance 6: Modified independent (Device/Increase time)   Stair Management Technique Two rails;Alternating pattern;Forwards   Number of Stairs 4   Height of Stairs 6     Balance   Balance Assessed Yes     High Level Balance   High Level Balance Comments Had pt stand tandem  with intermittent UE support needed.  Close S for safety.  Also provided for HEP and also did corner balance, pt very reliant on UEs and vision, therfore provided exercises to challenge vestibular system.                             PT Education - 03/14/17 [id number]    Education provided Yes   Education Details education on evaluation findings, initial HEP, MCD visit limits   Person(s) Educated Patient;Other (comment)  sister   Methods Explanation;Demonstration;Handout   Comprehension Verbalized understanding;Returned demonstration          PT Short Term Goals - 03/14/17 0949      PT SHORT TERM GOAL #1   Title Pt will report compliance with initial HEP in order to indicate decreased fall risk and improved functional strength.  (Target Date:  Following 1st visit after eval)   Baseline dependent    Status New     PT SHORT TERM GOAL #2   Title Will assess need for L foot up brace/AFO in order to decrease fall risk.    Baseline Note L foot drop with gait compensations during gait on eval   Status New     PT SHORT TERM GOAL #3   Title Will assess need/appropriateness for hurry cane/quad tip cane to improve safety with gait.    Baseline Did not have time to assess on eval.    Status New           PT Long Term Goals - 03/14/17 0951      PT LONG TERM GOAL #1   Title Pt will be indepenent with final HEP in order to indicate improved functional strength and decreased  fall risk.  (Target Date:  Following 3rd visit after eval)   Baseline dependent    Status New     PT LONG TERM GOAL #2   Title Pt will improve BERG balance score by 4 points in order to indicate decreased fall risk.     Baseline unable to assess due to time   Status New     PT LONG TERM GOAL #3   Title Pt will ambulate up to 300' w/ LRAD over indoor surfaces (including making turns) at mod I level in order to indicate safe home and work negotiation.     Baseline 115' at S level without AD    Status New     PT LONG TERM GOAL #4   Title Will assess ability to use rollator and/or push w/c in order to attend leisure activities with family at mod I level.     Baseline Does not go to events/functions with family that involve longer distances due to fatigue and balance.    Status New     PT LONG TERM GOAL #5   Title Pt will ambulate up to 500' w/ LRAD at mod I level over unlevel paved surfaces (including ramp/curb) in order to indicate safe return to community and work.     Baseline Did not assess outdoor gait due to time, see above for indoor gait.    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  He demonstrates  poor overall strength, decreased balance, and decreased endurance.  Note that he is very limited in returning to work due to fall risk and endurance deficits and also has been greatly limited with participating in family events due these same reasons.  Upon PT evaluation, 5TSS time of 36.12 secs indicative of decreased functional strength, gait speed of 3.23 ft/sec, however requires S for safety and decreased ability to maintain narrow BOS with tandem stance, indicative of fall risk.  Pt is of evolving presentation and moderate complexity per PT  POC stand point.  Pt will benefit from skilled OP neuro PT in order to address deficits.    Rehab Potential Good   Clinical Impairments Affecting Rehab Potential mcd visit limitations   PT Frequency Monthy   PT Duration 12 weeks   PT Treatment/Interventions ADLs/Self Care Home Management;DME Instruction;Gait training;Stair training;Functional mobility training;Therapeutic activities;Therapeutic exercise;Balance training;Neuromuscular re-education;Patient/family education;Orthotic Fit/Training;Vestibular   PT Next Visit Plan BERG, try foot up brace, hurry vs quad tip cane?, HEP compliance-add balance, strength and hip stretches   PT Home Exercise Plan initial HEP given 03/14/17   Consulted and Agree with Plan of Care Patient;Family member/caregiver   Family Member Consulted Sister [name]      Patient will benefit from skilled therapeutic intervention in order to improve the following deficits and impairments:  Abnormal gait, Decreased activity tolerance, Decreased balance, Decreased coordination, Decreased endurance, Decreased knowledge of use of DME, Decreased mobility, Decreased range of motion, Decreased strength, Impaired perceived functional ability, Impaired flexibility, Impaired sensation, Impaired UE functional use, Postural dysfunction  Visit Diagnosis: Unsteadiness on feet - Plan: PT plan of care cert/re-cert  Muscle weakness (generalized) - Plan: PT plan of care cert/re-cert  Other abnormalities of gait and mobility - Plan: PT plan of care cert/re-cert  Other disturbances of skin sensation - Plan: PT plan of care cert/re-cert     Problem List Patient Active Problem List   Diagnosis Date Noted  . 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 Short Achilles tendon (acquired), left ankle 11/19/2016  . Subacute osteomyelitis of left foot (HCC) 10/20/2016  . Gait abnormality 10/06/2016  . DKA (diabetic ketoacidoses) (HCC) 05/21/2016  . Nausea & vomiting 05/21/2016  .  Ketoacidosis 05/21/2016  . Malnutrition of moderate degree 05/21/2016  . Gastroparesis due to DM (HCC)   . Increased intraocular pressure 04/12/2016  . Post-op bleeding 11/29/2015  . Diabetic ulcer of left foot (HCC) 11/14/2015  . Acute osteomyelitis of metatarsal bone of left foot (HCC), Left 4th metatarsal 11/12/2015  . Vitamin D deficiency 11/12/2015  . Osteoporosis 11/12/2015  . Closed fracture of right tibial plateau 11/11/2015  . Visual disturbance 11/03/2015  . Neuropathy (HCC) 11/03/2015  . Gastroesophageal reflux disease without esophagitis 11/03/2015  . 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 ALLERGIC RHINITIS 12/11/2008  . 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 CERVICALGIA 07/19/2008  . INSOMNIA 08/24/2007  . Dyslipidemia 08/10/2007  . Diabetes type 2, uncontrolled (HCC) 08/09/2007    Harriet Butte, PT, MPT [facility] [address] [place], [place], [address] Phone: [phone]   Fax:  [phone] 03/14/17, 10:00 AM  Name: Travis [name] MRN: [id number] Date of Birth: 29-Nov-1971

## 2017-03-14 NOTE — Patient Instructions (Addendum)
Functional Quadriceps: Sit to Stand    Sit on edge of chair, feet flat on floor. Stand upright, extending knees fully. Repeat _10_ times per set. Do __1-2__ sets per session. Do __1-2__ sessions per day.  http://orth.exer.us/734   Copyright  VHI. All rights reserved.    Tandem Stance    Hold onto counter for support.  Right foot in front of left, heel touching toe both feet "straight ahead". Stand on Foot Triangle of Support with both feet. Balance in this position _10-15__ seconds. Do with left foot in front of right. Repeat each side x 3 reps.  Use hands as little as possible.   Copyright  VHI. All rights reserved.   Heel Raise: Bilateral (Standing)    Rise on balls of feet. Repeat _10___ times per set. Do _1-2___ sets per session. Do _1-2___ sessions per day.  http://orth.exer.us/38   Copyright  VHI. All rights reserved.    For these last two:  Stand in corner with a chair in front of you for safety.     Feet Apart, Arm Motion - Eyes Closed    With eyes closed and feet shoulder width apart, keep your arms by your side.  Repeat __3__ times per session 15 secs each. Do __2__ sessions per day.  Copyright  VHI. All rights reserved.   Feet Together, Arm Motion - Eyes Open    With eyes open, feet together, keep arms by your side.  Repeat __3__ times per session for 30 secs each. Do __2__ sessions per day.  Copyright  VHI. All rights reserved.

## 2017-03-28 ENCOUNTER — Ambulatory Visit: Payer: Medicaid Other | Admitting: Rehabilitation

## 2017-04-07 ENCOUNTER — Encounter (HOSPITAL_COMMUNITY): Payer: Self-pay | Admitting: Emergency Medicine

## 2017-04-07 ENCOUNTER — Emergency Department (HOSPITAL_COMMUNITY): Payer: Medicaid Other

## 2017-04-07 ENCOUNTER — Inpatient Hospital Stay (HOSPITAL_COMMUNITY)
Admission: EM | Admit: 2017-04-07 | Discharge: 2017-04-12 | DRG: 074 | Disposition: A | Discharge status: Home / Self Care | Payer: Medicaid Other | Attending: Internal Medicine | Admitting: Internal Medicine

## 2017-04-07 DIAGNOSIS — E1143 Type 2 diabetes mellitus with diabetic autonomic (poly)neuropathy: Principal | ICD-10-CM | POA: Diagnosis present

## 2017-04-07 DIAGNOSIS — Z6830 Body mass index (BMI) 30.0-30.9, adult: Secondary | ICD-10-CM

## 2017-04-07 DIAGNOSIS — E876 Hypokalemia: Secondary | ICD-10-CM | POA: Diagnosis not present

## 2017-04-07 DIAGNOSIS — Z79899 Other long term (current) drug therapy: Secondary | ICD-10-CM

## 2017-04-07 DIAGNOSIS — R112 Nausea with vomiting, unspecified: Secondary | ICD-10-CM | POA: Diagnosis present

## 2017-04-07 DIAGNOSIS — Z794 Long term (current) use of insulin: Secondary | ICD-10-CM

## 2017-04-07 DIAGNOSIS — Z89422 Acquired absence of other left toe(s): Secondary | ICD-10-CM

## 2017-04-07 DIAGNOSIS — K219 Gastro-esophageal reflux disease without esophagitis: Secondary | ICD-10-CM | POA: Diagnosis present

## 2017-04-07 DIAGNOSIS — E119 Type 2 diabetes mellitus without complications: Secondary | ICD-10-CM | POA: Diagnosis present

## 2017-04-07 DIAGNOSIS — E1165 Type 2 diabetes mellitus with hyperglycemia: Secondary | ICD-10-CM | POA: Diagnosis present

## 2017-04-07 DIAGNOSIS — K3184 Gastroparesis: Secondary | ICD-10-CM | POA: Diagnosis present

## 2017-04-07 DIAGNOSIS — R109 Unspecified abdominal pain: Secondary | ICD-10-CM

## 2017-04-07 DIAGNOSIS — R1012 Left upper quadrant pain: Secondary | ICD-10-CM

## 2017-04-07 DIAGNOSIS — R1110 Vomiting, unspecified: Secondary | ICD-10-CM

## 2017-04-07 DIAGNOSIS — E669 Obesity, unspecified: Secondary | ICD-10-CM | POA: Diagnosis present

## 2017-04-07 LAB — CBC WITH DIFFERENTIAL/PLATELET
Basophils Absolute: 0 10*3/uL (ref 0.0–0.1)
Basophils Relative: 0 %
EOS PCT: 0 %
Eosinophils Absolute: 0 10*3/uL (ref 0.0–0.7)
HCT: 36.6 % — ABNORMAL LOW (ref 39.0–52.0)
Hemoglobin: 13.1 g/dL (ref 13.0–17.0)
LYMPHS ABS: 1.4 10*3/uL (ref 0.7–4.0)
LYMPHS PCT: 14 %
MCH: 30.7 pg (ref 26.0–34.0)
MCHC: 35.8 g/dL (ref 30.0–36.0)
MCV: 85.7 fL (ref 78.0–100.0)
MONO ABS: 0.3 10*3/uL (ref 0.1–1.0)
MONOS PCT: 3 %
Neutro Abs: 8.1 10*3/uL — ABNORMAL HIGH (ref 1.7–7.7)
Neutrophils Relative %: 83 %
PLATELETS: 311 10*3/uL (ref 150–400)
RBC: 4.27 MIL/uL (ref 4.22–5.81)
RDW: 13 % (ref 11.5–15.5)
WBC: 9.9 10*3/uL (ref 4.0–10.5)

## 2017-04-07 LAB — COMPREHENSIVE METABOLIC PANEL
ALBUMIN: 4.6 g/dL (ref 3.5–5.0)
ALT: 22 U/L (ref 17–63)
AST: 27 U/L (ref 15–41)
Alkaline Phosphatase: 59 U/L (ref 38–126)
Anion gap: 14 (ref 5–15)
BUN: 16 mg/dL (ref 6–20)
CHLORIDE: 107 mmol/L (ref 101–111)
CO2: 18 mmol/L — AB (ref 22–32)
CREATININE: 0.93 mg/dL (ref 0.61–1.24)
Calcium: 9.3 mg/dL (ref 8.9–10.3)
GFR calc Af Amer: >60 mL/min (ref >60)
GLUCOSE: 140 mg/dL — AB (ref 65–99)
Potassium: 3.8 mmol/L (ref 3.5–5.1)
SODIUM: 139 mmol/L (ref 135–145)
Total Bilirubin: 1.4 mg/dL — ABNORMAL HIGH (ref 0.3–1.2)
Total Protein: 7.7 g/dL (ref 6.5–8.1)

## 2017-04-07 LAB — LIPASE, BLOOD: Lipase: 16 U/L (ref 11–51)

## 2017-04-07 MED ORDER — KETOROLAC TROMETHAMINE 30 MG/ML IJ SOLN
30.0000 mg | Freq: Once | INTRAMUSCULAR | Status: AC
  Administered 2017-04-07: 30 mg via INTRAVENOUS
  Filled 2017-04-07: qty 1

## 2017-04-07 MED ORDER — SODIUM CHLORIDE 0.9 % IV BOLUS (SEPSIS)
1000.0000 mL | Freq: Once | INTRAVENOUS | Status: AC
  Administered 2017-04-07: 1000 mL via INTRAVENOUS

## 2017-04-07 MED ORDER — ONDANSETRON HCL 4 MG/2ML IJ SOLN
4.0000 mg | Freq: Once | INTRAMUSCULAR | Status: AC
  Administered 2017-04-07: 4 mg via INTRAVENOUS
  Filled 2017-04-07: qty 2

## 2017-04-07 MED ORDER — FAMOTIDINE IN NACL 20-0.9 MG/50ML-% IV SOLN
20.0000 mg | INTRAVENOUS | Status: AC
  Administered 2017-04-07: 20 mg via INTRAVENOUS
  Filled 2017-04-07: qty 50

## 2017-04-07 NOTE — ED Triage Notes (Signed)
Per [facility] pt from home due to LLQ abdominal pain and N/V onset of this am. Hx of gastroparesis. Pt was positive for orthostatics viz EMS systolic 148 to 84 given 500 CC NS en route and 4 mg Zofran.

## 2017-04-07 NOTE — ED Provider Notes (Signed)
[facility]-EMERGENCY DEPT Provider Note   CSN: [id number] Arrival date & time: 04/07/17  2046  By signing my name below, I, Linna Darner, attest that this documentation has been prepared under the direction and in the presence of [employer], PA-C. Electronically Signed: Linna Darner, Scribe. 04/07/2017. 10:30 PM.   History   Chief Complaint Chief Complaint  Patient presents with  . Emesis    The history is provided by the patient. No language interpreter was used.    HPI Comments: Travis [name] is a 46 y.o. male with PMHx including gastroparesis, DM, GERD, and DKA who presents to the Emergency Department via EMS complaining of persistent nausea and vomiting beginning this morning. He reports some associated abdominal pain and notes this was the first symptom he experienced today. He states he has mostly been "dry heaving" and is only occasionally producing emesis. Pt notes he has been passing less flatus than usual today. His last BM was yesterday. No medications or treatments tried. No known ill contacts. No h/o bowel obstruction. Pt notes his blood glucose levels have been adequate lately. He was recently started on Bydureon  once a week and has missed his last two doses of this medication because he feels like he is not administering it correctly secondary to neuropathy. He denies fevers, dysuria, hematuria, CP, SOB, or any other associated symptoms.   Past Medical History:  Diagnosis Date  . Acute osteomyelitis of metatarsal bone of left foot (HCC) 11/12/2015  . Closed fracture of right tibial plateau 11/11/2015  . Depression with anxiety   . Diabetes mellitus    INSULIN DEPENDENT Type II  . Diabetic peripheral neuropathy associated with type 2 diabetes mellitus (HCC) 08/30/2008   Qualifier: Diagnosis of  By: Daphine Deutscher FNP, Zena Amos    . Diabetic ulcer of left foot (HCC) 11/14/2015  . Gastroparesis   . GERD (gastroesophageal reflux disease)   . Osteomyelitis (HCC)   .  Osteoporosis 11/12/2015  . Peripheral neuropathy   . Shortness of breath dyspnea    with exertion  . Vitamin D deficiency 11/12/2015    Patient Active Problem List   Diagnosis Date Noted  . 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 Short Achilles tendon (acquired), left ankle 11/19/2016  . Subacute osteomyelitis of left foot (HCC) 10/20/2016  . Gait abnormality 10/06/2016  . DKA (diabetic ketoacidoses) (HCC) 05/21/2016  . Nausea & vomiting 05/21/2016  . Ketoacidosis 05/21/2016  . Malnutrition of moderate degree 05/21/2016  . Gastroparesis due to DM (HCC)   . Increased intraocular pressure 04/12/2016  . Post-op bleeding 11/29/2015  . Diabetic ulcer of left foot (HCC) 11/14/2015  . Acute osteomyelitis of metatarsal bone of left foot (HCC), Left 4th metatarsal 11/12/2015  . Vitamin D deficiency 11/12/2015  . Osteoporosis 11/12/2015  . Closed fracture of right tibial plateau 11/11/2015  . Visual disturbance 11/03/2015  . Neuropathy 11/03/2015  . Gastroesophageal reflux disease without esophagitis 11/03/2015  . 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 ALLERGIC RHINITIS 12/11/2008  . 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 CERVICALGIA 07/19/2008  . INSOMNIA 08/24/2007  . Dyslipidemia 08/10/2007  . Diabetes type 2, uncontrolled (HCC) 08/09/2007    Past Surgical History:  Procedure Laterality Date  . AMPUTATION Left 11/28/2015   Procedure: Left 4th Ray Amputation;  Surgeon: Nadara Mustard, MD;  Location: [facility] OR;  Service: Orthopedics;  Laterality: Left;  . AMPUTATION Left 10/29/2016   Procedure: LEFT 5TH RAY AMPUTATION;  Surgeon: Randa Evens  Lajoyce Corners, MD;  Location: [facility] OR;  Service: Orthopedics;  Laterality: Left;  . ANTERIOR VITRECTOMY Left 04/12/2016   Procedure: ANTERIOR CHAMBER WASH OUT WITH GAS FLUID EXCHANGE;  Surgeon: Carmela Rima, MD;  Location: [facility] OR;  Service: Ophthalmology;  Laterality: Left;  . CATARACT EXTRACTION  Left   . EYE SURGERY     left eye surgery 04/2016  . KNEE SURGERY Left   . SHOULDER SURGERY Right   . TOE AMPUTATION Left 11/28/15   4th toe       Home Medications    Prior to Admission medications   Medication Sig Start Date End Date Taking? Authorizing Provider  BYDUREON 2 MG PEN Inject 2 mg as directed every 7 (seven) days. 01/25/17  Yes Historical Provider, MD  gabapentin (NEURONTIN) 600 MG tablet Take 1,800 mg by mouth at bedtime.   Yes Historical Provider, MD  insulin detemir (LEVEMIR) 100 UNIT/ML injection Inject 0.2 mLs (20 Units total) into the skin at bedtime. 06/04/16  Yes [name], FNP  metFORMIN (GLUCOPHAGE) 1000 MG tablet Take 1,000 mg by mouth 2 (two) times daily.   Yes Historical Provider, MD  metoCLOPramide (REGLAN) 10 MG tablet Take 1 tablet (10 mg total) by mouth 4 (four) times daily -  before meals and at bedtime. Patient taking differently: Take 10 mg by mouth 2 (two) times daily.  05/23/16  Yes Jeralyn Bennett, MD  HYDROcodone-acetaminophen (NORCO/VICODIN) 5-325 MG tablet Take 1 tablet by mouth every 4 (four) hours as needed for moderate pain. Patient not taking: Reported on 03/14/2017 10/29/16   Nadara Mustard, MD  insulin aspart (NOVOLOG) 100 UNIT/ML FlexPen Inject 0-15 Units into the skin 4 (four) times daily -  with meals and at bedtime. Dose based on blood sugars taken before meals&bedtime. Patient not taking: Reported on 03/14/2017 06/04/16   [name], FNP    Family History Family History  Problem Relation Age of Onset  . Cancer Paternal Grandmother   . Diabetes Mother   . COPD Mother   . Heart failure Mother   . Esophageal cancer Father   . Diabetes Sister   . Diabetes Maternal Uncle   . Diabetes Maternal Grandmother     Social History Social History  Substance Use Topics  . Smoking status: Never Smoker  . Smokeless tobacco: Never Used  . Alcohol use No     Allergies   No known allergies   Review of Systems Review of  Systems All other systems reviewed and are negative for acute change except as noted in the HPI.   Physical Exam Updated Vital Signs BP (!) 151/85 (BP Location: Right Arm)   Pulse 82   Temp 98 F (36.7 C) (Oral)   Resp 19   Ht  (1.88 m)   Wt 106.6 kg   SpO2 100%   BMI 30.17 kg/m   Physical Exam  Constitutional: He is oriented to person, place, and time. He appears well-developed and well-nourished. No distress.  Dry heaving. Appears uncomfortable, but nontoxic.  HENT:  Head: Normocephalic and atraumatic.  Eyes: Conjunctivae and EOM are normal. No scleral icterus.  Neck: Normal range of motion.  Cardiovascular: Normal rate, regular rhythm and intact distal pulses.   Pulmonary/Chest: Effort normal. No respiratory distress.  Respirations even and unlabored  Abdominal: Soft. He exhibits no mass. There is tenderness. There is no guarding.  Soft, obese abdomen. There is LUQ and epigastric TTP. No masses or peritoneal signs.  Musculoskeletal: Normal range of motion.  Neurological: He is alert and oriented to person, place, and time. He exhibits normal muscle tone. Coordination normal.  GCS 15. Patient moving all extremities.  Skin: Skin is warm and dry. No rash noted. He is not diaphoretic. No erythema. No pallor.  Psychiatric: He has a normal mood and affect. His behavior is normal.  Nursing note and vitals reviewed.   ED Treatments / Results  Labs (all labs ordered are listed, but only abnormal results are displayed) Labs Reviewed  COMPREHENSIVE METABOLIC PANEL - Abnormal; Notable for the following:       Result Value   CO2 18 (*)    Glucose, Bld 140 (*)    Total Bilirubin 1.4 (*)    All other components within normal limits  URINALYSIS, ROUTINE W REFLEX MICROSCOPIC - Abnormal; Notable for the following:    Glucose, UA 50 (*)    Hgb urine dipstick SMALL (*)    Ketones, ur 80 (*)    Protein, ur 100 (*)    Squamous Epithelial / LPF 0-5 (*)    All other components  within normal limits  CBC WITH DIFFERENTIAL/PLATELET - Abnormal; Notable for the following:    HCT 36.6 (*)    Neutro Abs 8.1 (*)    All other components within normal limits  LIPASE, BLOOD    EKG  EKG Interpretation None       Radiology Dg Abd 2 Views  Result Date: 04/07/2017 CLINICAL DATA:  Nausea and vomiting EXAM: ABDOMEN - 2 VIEW COMPARISON:  05/21/2016 FINDINGS: Decubitus view demonstrates no gross free air. Nonobstructed gas pattern. No abnormal calcifications. IMPRESSION: Nonobstructed bowel-gas pattern Electronically Signed   By: Jasmine Pang M.D.   On: 04/07/2017 23:10    Procedures Procedures (including critical care time)  DIAGNOSTIC STUDIES: Oxygen Saturation is 100% on RA, normal by my interpretation.    COORDINATION OF CARE: 10:40 PM Discussed treatment plan with pt at bedside and pt agreed to plan.  Medications Ordered in ED Medications  lactated ringers bolus 1,000 mL (1,000 mLs Intravenous New Bag/Given 04/08/17 0245)  sodium chloride 0.9 % bolus 1,000 mL (0 mLs Intravenous Stopped 04/08/17 0005)  ondansetron (ZOFRAN) injection 4 mg (4 mg Intravenous Given 04/07/17 2252)  ketorolac (TORADOL) 30 MG/ML injection 30 mg (30 mg Intravenous Given 04/07/17 2254)  famotidine (PEPCID) IVPB 20 mg premix (0 mg Intravenous Stopped 04/07/17 2335)  haloperidol lactate (HALDOL) injection 2 mg (2 mg Intravenous Given 04/08/17 0105)  dicyclomine (BENTYL) injection 20 mg (20 mg Intramuscular Given 04/08/17 0104)  sodium chloride 0.9 % bolus 1,000 mL (0 mLs Intravenous Stopped 04/08/17 0238)  promethazine (PHENERGAN) injection 12.5 mg (12.5 mg Intravenous Given 04/08/17 0239)     Initial Impression / Assessment and Plan / ED Course  I have reviewed the triage vital signs and the nursing notes.  Pertinent labs & imaging results that were available during my care of the patient were reviewed by me and considered in my medical decision making (see chart for details).     46 y/o  male with hx of diabetes presents for abdominal pain and vomiting. No evidence of acute surgical abdomen on exam. No fever or leukocytosis. Xray negative for SBO or free air. Pain has improved with Bentyl and Toradol; however nausea and vomiting persist despite multiple antiemetics. Plan for admission for further management. Suspect diabetic gastroparesis.   Final Clinical Impressions(s) / ED Diagnoses   Final diagnoses:  Abdominal pain  Diabetic gastroparesis [facility])    New Prescriptions New Prescriptions  No medications on file    I personally performed the services described in this documentation, which was scribed in my presence. The recorded information has been reviewed and is accurate.      Antony Madura, PA-C 04/08/17 0359    Melene Plan, DO 04/09/17 1047

## 2017-04-07 NOTE — ED Notes (Signed)
Patient transported to X-ray 

## 2017-04-07 NOTE — ED Notes (Signed)
Bed: WA01 Expected date:  Expected time:  Means of arrival:  Comments: 46 yo M  abd pain

## 2017-04-08 ENCOUNTER — Encounter (HOSPITAL_COMMUNITY): Payer: Self-pay | Admitting: Internal Medicine

## 2017-04-08 DIAGNOSIS — R112 Nausea with vomiting, unspecified: Secondary | ICD-10-CM | POA: Diagnosis present

## 2017-04-08 DIAGNOSIS — K3184 Gastroparesis: Secondary | ICD-10-CM | POA: Diagnosis present

## 2017-04-08 DIAGNOSIS — E1165 Type 2 diabetes mellitus with hyperglycemia: Secondary | ICD-10-CM | POA: Diagnosis present

## 2017-04-08 DIAGNOSIS — E1143 Type 2 diabetes mellitus with diabetic autonomic (poly)neuropathy: Principal | ICD-10-CM

## 2017-04-08 DIAGNOSIS — G43A0 Cyclical vomiting, not intractable: Secondary | ICD-10-CM | POA: Diagnosis not present

## 2017-04-08 DIAGNOSIS — Z89422 Acquired absence of other left toe(s): Secondary | ICD-10-CM | POA: Diagnosis not present

## 2017-04-08 DIAGNOSIS — Z79899 Other long term (current) drug therapy: Secondary | ICD-10-CM | POA: Diagnosis not present

## 2017-04-08 DIAGNOSIS — Z794 Long term (current) use of insulin: Secondary | ICD-10-CM | POA: Diagnosis not present

## 2017-04-08 DIAGNOSIS — E669 Obesity, unspecified: Secondary | ICD-10-CM | POA: Diagnosis present

## 2017-04-08 DIAGNOSIS — Z6830 Body mass index (BMI) 30.0-30.9, adult: Secondary | ICD-10-CM | POA: Diagnosis not present

## 2017-04-08 DIAGNOSIS — K219 Gastro-esophageal reflux disease without esophagitis: Secondary | ICD-10-CM | POA: Diagnosis present

## 2017-04-08 DIAGNOSIS — E876 Hypokalemia: Secondary | ICD-10-CM | POA: Diagnosis not present

## 2017-04-08 LAB — BASIC METABOLIC PANEL
Anion gap: 12 (ref 5–15)
Anion gap: 9 (ref 5–15)
BUN: 17 mg/dL (ref 6–20)
BUN: 17 mg/dL (ref 6–20)
CALCIUM: 8.3 mg/dL — AB (ref 8.9–10.3)
CO2: 19 mmol/L — ABNORMAL LOW (ref 22–32)
CO2: 20 mmol/L — ABNORMAL LOW (ref 22–32)
Calcium: 8.5 mg/dL — ABNORMAL LOW (ref 8.9–10.3)
Chloride: 108 mmol/L (ref 101–111)
Chloride: 107 mmol/L (ref 101–111)
Creatinine, Ser: 0.88 mg/dL (ref 0.61–1.24)
Creatinine, Ser: 0.9 mg/dL (ref 0.61–1.24)
GFR calc Af Amer: >60 mL/min (ref >60)
GFR calc Af Amer: >60 mL/min (ref >60)
GLUCOSE: 144 mg/dL — AB (ref 65–99)
GLUCOSE: 127 mg/dL — AB (ref 65–99)
Potassium: 3.5 mmol/L (ref 3.5–5.1)
Potassium: 3.3 mmol/L — ABNORMAL LOW (ref 3.5–5.1)
Sodium: 139 mmol/L (ref 135–145)
Sodium: 136 mmol/L (ref 135–145)

## 2017-04-08 LAB — CBC WITH DIFFERENTIAL/PLATELET
BASOS ABS: 0 10*3/uL (ref 0.0–0.1)
BASOS PCT: 0 %
EOS PCT: 0 %
Eosinophils Absolute: 0 10*3/uL (ref 0.0–0.7)
HCT: 32.7 % — ABNORMAL LOW (ref 39.0–52.0)
Hemoglobin: 11.3 g/dL — ABNORMAL LOW (ref 13.0–17.0)
LYMPHS PCT: 12 %
Lymphs Abs: 1.3 10*3/uL (ref 0.7–4.0)
MCH: 30 pg (ref 26.0–34.0)
MCHC: 34.6 g/dL (ref 30.0–36.0)
MCV: 86.7 fL (ref 78.0–100.0)
MONO ABS: 0.4 10*3/uL (ref 0.1–1.0)
Monocytes Relative: 4 %
Neutro Abs: 9.4 10*3/uL — ABNORMAL HIGH (ref 1.7–7.7)
Neutrophils Relative %: 84 %
PLATELETS: 304 10*3/uL (ref 150–400)
RBC: 3.77 MIL/uL — ABNORMAL LOW (ref 4.22–5.81)
RDW: 13.3 % (ref 11.5–15.5)
WBC: 11.1 10*3/uL — ABNORMAL HIGH (ref 4.0–10.5)

## 2017-04-08 LAB — URINALYSIS, ROUTINE W REFLEX MICROSCOPIC
BACTERIA UA: NONE SEEN
Bilirubin Urine: NEGATIVE
Glucose, UA: 50 mg/dL — AB
Ketones, ur: 80 mg/dL — AB
Leukocytes, UA: NEGATIVE
Nitrite: NEGATIVE
PROTEIN: 100 mg/dL — AB
Specific Gravity, Urine: 1.025 (ref 1.005–1.030)
pH: 5 (ref 5.0–8.0)

## 2017-04-08 LAB — GLUCOSE, CAPILLARY
GLUCOSE-CAPILLARY: 110 mg/dL — AB (ref 65–99)
GLUCOSE-CAPILLARY: 124 mg/dL — AB (ref 65–99)
GLUCOSE-CAPILLARY: 128 mg/dL — AB (ref 65–99)
GLUCOSE-CAPILLARY: 136 mg/dL — AB (ref 65–99)
GLUCOSE-CAPILLARY: 136 mg/dL — AB (ref 65–99)
Glucose-Capillary: 116 mg/dL — ABNORMAL HIGH (ref 65–99)

## 2017-04-08 LAB — RAPID URINE DRUG SCREEN, HOSP PERFORMED
Amphetamines: NOT DETECTED
BENZODIAZEPINES: NOT DETECTED
Barbiturates: NOT DETECTED
Cocaine: NOT DETECTED
Opiates: NOT DETECTED
Tetrahydrocannabinol: POSITIVE — AB

## 2017-04-08 MED ORDER — HYDRALAZINE HCL 20 MG/ML IJ SOLN: 10.0000 mg | INTRAMUSCULAR | Status: DC | PRN

## 2017-04-08 MED ORDER — METOCLOPRAMIDE HCL 5 MG/ML IJ SOLN
5.0000 mg | Freq: Four times a day (QID) | INTRAMUSCULAR | Status: AC
  Administered 2017-04-08 (×3): 5 mg via INTRAVENOUS
  Filled 2017-04-08 (×3): qty 2

## 2017-04-08 MED ORDER — ACETAMINOPHEN 325 MG PO TABS
650.0000 mg | ORAL_TABLET | Freq: Four times a day (QID) | ORAL | Status: DC | PRN
  Administered 2017-04-08: 650 mg via ORAL
  Filled 2017-04-08: qty 2

## 2017-04-08 MED ORDER — CALCIUM CARBONATE ANTACID 500 MG PO CHEW
1.0000 | CHEWABLE_TABLET | Freq: Four times a day (QID) | ORAL | Status: DC | PRN
  Administered 2017-04-08 – 2017-04-11 (×8): 200 mg via ORAL
  Filled 2017-04-08 (×8): qty 1

## 2017-04-08 MED ORDER — LACTATED RINGERS IV BOLUS (SEPSIS)
1000.0000 mL | Freq: Once | INTRAVENOUS | Status: AC
  Administered 2017-04-08: 1000 mL via INTRAVENOUS

## 2017-04-08 MED ORDER — HALOPERIDOL LACTATE 5 MG/ML IJ SOLN
2.0000 mg | Freq: Once | INTRAMUSCULAR | Status: AC
  Administered 2017-04-08: 2 mg via INTRAVENOUS
  Filled 2017-04-08: qty 1

## 2017-04-08 MED ORDER — SODIUM CHLORIDE 0.9 % IV BOLUS (SEPSIS)
1000.0000 mL | Freq: Once | INTRAVENOUS | Status: AC
  Administered 2017-04-08: 1000 mL via INTRAVENOUS

## 2017-04-08 MED ORDER — SODIUM CHLORIDE 0.45 % IV SOLN
INTRAVENOUS | Status: DC
  Administered 2017-04-08: 05:00:00 via INTRAVENOUS

## 2017-04-08 MED ORDER — INSULIN DETEMIR 100 UNIT/ML ~~LOC~~ SOLN
5.0000 [IU] | Freq: Every day | SUBCUTANEOUS | Status: DC
  Administered 2017-04-08 – 2017-04-12 (×5): 5 [IU] via SUBCUTANEOUS
  Filled 2017-04-08 (×5): qty 0.05

## 2017-04-08 MED ORDER — DEXTROSE-NACL 5-0.45 % IV SOLN: INTRAVENOUS | Status: DC

## 2017-04-08 MED ORDER — ACETAMINOPHEN 650 MG RE SUPP: 650.0000 mg | Freq: Four times a day (QID) | RECTAL | Status: DC | PRN

## 2017-04-08 MED ORDER — INSULIN ASPART 100 UNIT/ML ~~LOC~~ SOLN
0.0000 [IU] | SUBCUTANEOUS | Status: DC
  Administered 2017-04-08 – 2017-04-09 (×5): 1 [IU] via SUBCUTANEOUS
  Administered 2017-04-09: 2 [IU] via SUBCUTANEOUS
  Administered 2017-04-09 – 2017-04-10 (×5): 1 [IU] via SUBCUTANEOUS

## 2017-04-08 MED ORDER — DICYCLOMINE HCL 10 MG/ML IM SOLN
20.0000 mg | Freq: Once | INTRAMUSCULAR | Status: AC
  Administered 2017-04-08: 20 mg via INTRAMUSCULAR
  Filled 2017-04-08: qty 2

## 2017-04-08 MED ORDER — PROMETHAZINE HCL 25 MG/ML IJ SOLN
12.5000 mg | Freq: Once | INTRAMUSCULAR | Status: AC
Start: 2017-04-08 — End: 2017-04-08
  Administered 2017-04-08: 12.5 mg via INTRAVENOUS
  Filled 2017-04-08: qty 1

## 2017-04-08 MED ORDER — ENOXAPARIN SODIUM 40 MG/0.4ML ~~LOC~~ SOLN
40.0000 mg | SUBCUTANEOUS | Status: DC
  Administered 2017-04-08 – 2017-04-12 (×5): 40 mg via SUBCUTANEOUS
  Filled 2017-04-08 (×5): qty 0.4

## 2017-04-08 MED ORDER — POTASSIUM CHLORIDE CRYS ER 20 MEQ PO TBCR
40.0000 meq | EXTENDED_RELEASE_TABLET | Freq: Once | ORAL | Status: AC
  Administered 2017-04-08: 40 meq via ORAL
  Filled 2017-04-08: qty 2

## 2017-04-08 MED ORDER — PROMETHAZINE HCL 25 MG/ML IJ SOLN
12.5000 mg | Freq: Four times a day (QID) | INTRAMUSCULAR | Status: DC | PRN
  Administered 2017-04-08 – 2017-04-11 (×9): 12.5 mg via INTRAVENOUS
  Filled 2017-04-08 (×9): qty 1

## 2017-04-08 MED ORDER — ONDANSETRON HCL 4 MG PO TABS: 4.0000 mg | ORAL_TABLET | Freq: Four times a day (QID) | ORAL | Status: DC | PRN

## 2017-04-08 MED ORDER — DEXTROSE-NACL 5-0.45 % IV SOLN
INTRAVENOUS | Status: DC
  Administered 2017-04-08: 21:00:00 via INTRAVENOUS

## 2017-04-08 MED ORDER — ONDANSETRON HCL 4 MG/2ML IJ SOLN
4.0000 mg | Freq: Four times a day (QID) | INTRAMUSCULAR | Status: DC | PRN
  Administered 2017-04-08 – 2017-04-10 (×6): 4 mg via INTRAVENOUS
  Filled 2017-04-08 (×6): qty 2

## 2017-04-08 NOTE — ED Notes (Signed)
Pt reports dec nausea and 'feeling much better' but states that he'd feel more comfortable being admitted.  PA Kelly aware.

## 2017-04-08 NOTE — Progress Notes (Signed)
  PROGRESS NOTE  Patient admitted earlier this morning. See H&P. Patient admitted with nausea, vomiting, dry heaving. He had some abdominal pain which has resolved. He does have history of diabetic gastroparesis. Currently, he states that he feels much better. He would like to try some Jell-O this morning. Nausea is much better controlled. Had small bowel movement this morning.   Noralee Stain, DO [facility] [email or web address] Password [email or web address] 04/08/2017, 11:05 AM

## 2017-04-08 NOTE — H&P (Signed)
History and Physical    Travis [name] UEA:[id number] DOB: 1971-10-15 DOA: 04/07/2017  PCP: Salli Real, MD  Patient coming from: Home.  Chief Complaint: Nausea vomiting.  HPI: Travis [name] is a 46 y.o. male with history of diabetes mellitus type 2, previous history of osteomyelitis and history of recurrent nausea vomiting from gastroparesis presents to the ER with complaints of persistent vomiting since yesterday morning. Has been having some crampy abdominal pain but denies any diarrhea. Denies any sick contacts or recent travel.   ED Course: In the ER acute abdominal series was unremarkable. Abdomen on exam was appearing benign. Labs revealed elevated anion gap but her sugars were less than 200. Urine does show ketosis probably from vomiting. Patient was given multiple boluses of IV fluid total of 3 L and since patient is still having vomiting despite multiple doses of antiemetics patient will be admitted for further observation.  Review of Systems: As per HPI, rest all negative.   Past Medical History:  Diagnosis Date  . Acute osteomyelitis of metatarsal bone of left foot (HCC) 11/12/2015  . Closed fracture of right tibial plateau 11/11/2015  . Depression with anxiety   . Diabetes mellitus    INSULIN DEPENDENT Type II  . Diabetic peripheral neuropathy associated with type 2 diabetes mellitus (HCC) 08/30/2008   Qualifier: Diagnosis of  By: Daphine Deutscher FNP, Zena Amos    . Diabetic ulcer of left foot (HCC) 11/14/2015  . Gastroparesis   . GERD (gastroesophageal reflux disease)   . Osteomyelitis (HCC)   . Osteoporosis 11/12/2015  . Peripheral neuropathy   . Shortness of breath dyspnea    with exertion  . Vitamin D deficiency 11/12/2015    Past Surgical History:  Procedure Laterality Date  . AMPUTATION Left 11/28/2015   Procedure: Left 4th Ray Amputation;  Surgeon: Nadara Mustard, MD;  Location: [facility] OR;  Service: Orthopedics;  Laterality: Left;  . AMPUTATION Left 10/29/2016   Procedure: LEFT 5TH RAY AMPUTATION;  Surgeon: Nadara Mustard, MD;  Location: [facility] OR;  Service: Orthopedics;  Laterality: Left;  . ANTERIOR VITRECTOMY Left 04/12/2016   Procedure: ANTERIOR CHAMBER WASH OUT WITH GAS FLUID EXCHANGE;  Surgeon: Carmela Rima, MD;  Location: [facility] OR;  Service: Ophthalmology;  Laterality: Left;  . CATARACT EXTRACTION Left   . EYE SURGERY     left eye surgery 04/2016  . KNEE SURGERY Left   . SHOULDER SURGERY Right   . TOE AMPUTATION Left 11/28/15   4th toe     reports that he has never smoked. He has never used smokeless tobacco. He reports that he does not drink alcohol or use drugs.  Allergies  Allergen Reactions  . No Known Allergies     Family History  Problem Relation Age of Onset  . Cancer Paternal Grandmother   . Diabetes Mother   . COPD Mother   . Heart failure Mother   . Esophageal cancer Father   . Diabetes Sister   . Diabetes Maternal Uncle   . Diabetes Maternal Grandmother     Prior to Admission medications   Medication Sig Start Date End Date Taking? Authorizing Provider  BYDUREON 2 MG PEN Inject 2 mg as directed every 7 (seven) days. 01/25/17  Yes Historical Provider, MD  gabapentin (NEURONTIN) 600 MG tablet Take 1,800 mg by mouth at bedtime.   Yes Historical Provider, MD  insulin detemir (LEVEMIR) 100 UNIT/ML injection Inject 0.2 mLs (20 Units total) into the skin at bedtime. 06/04/16  Yes Erin Sons  Lawanda Cousins, FNP  metFORMIN (GLUCOPHAGE) 1000 MG tablet Take 1,000 mg by mouth 2 (two) times daily.   Yes Historical Provider, MD  metoCLOPramide (REGLAN) 10 MG tablet Take 1 tablet (10 mg total) by mouth 4 (four) times daily -  before meals and at bedtime. Patient taking differently: Take 10 mg by mouth 2 (two) times daily.  05/23/16  Yes Jeralyn Bennett, MD  HYDROcodone-acetaminophen (NORCO/VICODIN) 5-325 MG tablet Take 1 tablet by mouth every 4 (four) hours as needed for moderate pain. Patient not taking: Reported on 03/14/2017 10/29/16   Nadara Mustard,  MD  insulin aspart (NOVOLOG) 100 UNIT/ML FlexPen Inject 0-15 Units into the skin 4 (four) times daily -  with meals and at bedtime. Dose based on blood sugars taken before meals&bedtime. Patient not taking: Reported on 03/14/2017 06/04/16   [name], FNP    Physical Exam: Vitals:   04/07/17 2130 04/07/17 2200 04/07/17 2230 04/08/17 0229  BP: (!) 166/78 (!) 167/85 136/87 (!) 151/85  Pulse: 80 76 81 82  Resp: Temp:    98 F (36.7 C)  TempSrc:    Oral  SpO2: 100% 100% 94% 100%  Weight:      Height:          Constitutional: Moderately built and nourished. Vitals:   04/07/17 2130 04/07/17 2200 04/07/17 2230 04/08/17 0229  BP: (!) 166/78 (!) 167/85 136/87 (!) 151/85  Pulse: 80 76 81 82  Resp: Temp:    98 F (36.7 C)  TempSrc:    Oral  SpO2: 100% 100% 94% 100%  Weight:      Height:       Eyes: Anicteric. no pallor. ENMT: No discharge from the ears eyes nose and mouth. Neck: No mass felt. No neck rigidity. Respiratory: No rhonchi or crepitations. Cardiovascular: S1-S2 heard no murmurs appreciated. Abdomen: Soft nontender bowel sounds present. Musculoskeletal: Left foot has partial amputation of the lateral toes. Skin: No rash. Skin appears warm. Neurologic: Alert awake oriented to time place and person. Moves all extremities. Psychiatric: Appears normal. Normal affect.   Labs on Admission: I have personally reviewed following labs and imaging studies  CBC:  Recent Labs Lab 04/07/17 2110  WBC 9.9  NEUTROABS 8.1*  HGB 13.1  HCT 36.6*  MCV 85.7  PLT 311   Basic Metabolic Panel:  Recent Labs Lab 04/07/17 2110  NA 139  K 3.8  CL 107  CO2 18*  GLUCOSE 140*  BUN 16  CREATININE 0.93  CALCIUM 9.3   GFR: Estimated Creatinine Clearance: 130.5 mL/min (by C-G formula based on SCr of 0.93 mg/dL). Liver Function Tests:  Recent Labs Lab 04/07/17 2110  AST 27  ALT 22  ALKPHOS 59  BILITOT 1.4*  PROT 7.7  ALBUMIN 4.6     Recent Labs Lab 04/07/17 2110  LIPASE 16   No results for input(s): AMMONIA in the last 168 hours. Coagulation Profile: No results for input(s): INR, PROTIME in the last 168 hours. Cardiac Enzymes: No results for input(s): CKTOTAL, CKMB, CKMBINDEX, TROPONINI in the last 168 hours. BNP (last 3 results) No results for input(s): PROBNP in the last 8760 hours. HbA1C: No results for input(s): HGBA1C in the last 72 hours. CBG: No results for input(s): GLUCAP in the last 168 hours. Lipid Profile: No results for input(s): CHOL, HDL, LDLCALC, TRIG, CHOLHDL, LDLDIRECT in the last 72 hours. Thyroid Function Tests: No results for input(s): TSH, T4TOTAL, FREET4, T3FREE,  THYROIDAB in the last 72 hours. Anemia Panel: No results for input(s): VITAMINB12, FOLATE, FERRITIN, TIBC, IRON, RETICCTPCT in the last 72 hours. Urine analysis:    Component Value Date/Time   COLORURINE YELLOW 04/07/2017 2103   APPEARANCEUR CLEAR 04/07/2017 2103   LABSPEC 1.025 04/07/2017 2103   PHURINE 5.0 04/07/2017 2103   GLUCOSEU 50 (A) 04/07/2017 2103   HGBUR SMALL (A) 04/07/2017 2103   HGBUR negative 09/17/2009 0834   BILIRUBINUR NEGATIVE 04/07/2017 2103   KETONESUR 80 (A) 04/07/2017 2103   PROTEINUR 100 (A) 04/07/2017 2103   UROBILINOGEN 1.0 04/30/2016 1338   NITRITE NEGATIVE 04/07/2017 2103   LEUKOCYTESUR NEGATIVE 04/07/2017 2103   Sepsis Labs: (procalcitonin:4,lacticidven:4) )No results found for this or any previous visit (from the past 240 hour(s)).   Radiological Exams on Admission: Dg Abd 2 Views  Result Date: 04/07/2017 CLINICAL DATA:  Nausea and vomiting EXAM: ABDOMEN - 2 VIEW COMPARISON:  05/21/2016 FINDINGS: Decubitus view demonstrates no gross free air. Nonobstructed gas pattern. No abnormal calcifications. IMPRESSION: Nonobstructed bowel-gas pattern Electronically Signed   By: Jasmine Pang M.D.   On: 04/07/2017 23:10     Assessment/Plan Principal Problem:   Nausea &  vomiting Active Problems:   Diabetes type 2, uncontrolled (HCC)    1. Intractable nausea vomiting likely from diabetic gastroparesis - gastric emptying study done in July 2017 showed delayed emptying. Patient does take Reglan at home. I have placed patient nothing by mouth and on IV fluids and also place patient on IV Reglan for 3 doses every 6. Further doses to be decided based on response. Closely observe. Check urine drug screen. 2. Diabetes mellitus type 2 - patient's anion gap is elevated at suspect this is from vomiting and starvation. Closely follow metabolic panel to ensure patient is not developing diabetic ketoacidosis. Since patient is nothing by mouth and will decrease patient's Levemir dose from 20 to 5 units with sliding scale coverage. 3. Elevated blood pressure - probably secondary to stress. Closely follow blood pressure trends. I have placed patient on when necessary IV hydralazine. 4. Previous history of osteomyelitis.   DVT prophylaxis: Lovenox. Code Status: Full code.  Family Communication: Discussed with family.  Disposition Plan: Home.  Consults called: None.  Admission status: Observation.    Eduard Clos MD [facility] Pager [phone].  If 7PM-7AM, please contact night-coverage [email or web address] Password [email or web address]  04/08/2017, 4:18 AM

## 2017-04-09 DIAGNOSIS — G43A0 Cyclical vomiting, not intractable: Secondary | ICD-10-CM

## 2017-04-09 LAB — CBC
HCT: 34.6 % — ABNORMAL LOW (ref 39.0–52.0)
HEMOGLOBIN: 12.1 g/dL — AB (ref 13.0–17.0)
MCH: 30.1 pg (ref 26.0–34.0)
MCHC: 35 g/dL (ref 30.0–36.0)
MCV: 86.1 fL (ref 78.0–100.0)
PLATELETS: 327 10*3/uL (ref 150–400)
RBC: 4.02 MIL/uL — AB (ref 4.22–5.81)
RDW: 13.1 % (ref 11.5–15.5)
WBC: 13 10*3/uL — AB (ref 4.0–10.5)

## 2017-04-09 LAB — GLUCOSE, CAPILLARY
GLUCOSE-CAPILLARY: 127 mg/dL — AB (ref 65–99)
GLUCOSE-CAPILLARY: 147 mg/dL — AB (ref 65–99)
Glucose-Capillary: 149 mg/dL — ABNORMAL HIGH (ref 65–99)
Glucose-Capillary: 157 mg/dL — ABNORMAL HIGH (ref 65–99)
Glucose-Capillary: 122 mg/dL — ABNORMAL HIGH (ref 65–99)

## 2017-04-09 LAB — BASIC METABOLIC PANEL WITH GFR
Anion gap: 8 (ref 5–15)
BUN: 11 mg/dL (ref 6–20)
CO2: 22 mmol/L (ref 22–32)
Calcium: 8.7 mg/dL — ABNORMAL LOW (ref 8.9–10.3)
Chloride: 104 mmol/L (ref 101–111)
Creatinine, Ser: 0.79 mg/dL (ref 0.61–1.24)
GFR calc Af Amer: >60 mL/min
GFR calc non Af Amer: >60 mL/min
Glucose, Bld: 142 mg/dL — ABNORMAL HIGH (ref 65–99)
Potassium: 3.4 mmol/L — ABNORMAL LOW (ref 3.5–5.1)
Sodium: 134 mmol/L — ABNORMAL LOW (ref 135–145)

## 2017-04-09 LAB — MAGNESIUM: Magnesium: 1.7 mg/dL (ref 1.7–2.4)

## 2017-04-09 MED ORDER — PANTOPRAZOLE SODIUM 40 MG PO TBEC: 40.0000 mg | DELAYED_RELEASE_TABLET | Freq: Two times a day (BID) | ORAL | Status: DC

## 2017-04-09 MED ORDER — METOCLOPRAMIDE HCL 5 MG/ML IJ SOLN
5.0000 mg | Freq: Four times a day (QID) | INTRAMUSCULAR | Status: AC
  Administered 2017-04-09 (×3): 5 mg via INTRAVENOUS
  Filled 2017-04-09 (×3): qty 2

## 2017-04-09 MED ORDER — PANTOPRAZOLE SODIUM 40 MG PO TBEC
40.0000 mg | DELAYED_RELEASE_TABLET | Freq: Two times a day (BID) | ORAL | Status: DC
  Administered 2017-04-09 – 2017-04-12 (×7): 40 mg via ORAL
  Filled 2017-04-09 (×7): qty 1

## 2017-04-09 NOTE — Progress Notes (Signed)
PROGRESS NOTE    Travis [name]  AVW:[id number] DOB: 04/27/1971 DOA: 04/07/2017 PCP: Salli Real, MD     Brief Narrative:  Travis [name] is a 46 y.o. male with history of diabetes mellitus type 2, previous history of osteomyelitis and history of recurrent nausea vomiting from gastroparesis presents to the ER with complaints of persistent vomiting. Has been having some crampy abdominal pain but denies any diarrhea. He was admitted to the hospital for diabetic gastroparesis with intractable nausea and vomiting.  Assessment & Plan:   Principal Problem:   Nausea & vomiting Active Problems:   Diabetes type 2, uncontrolled (HCC)   Nausea and vomiting   Intractable nausea and vomiting likely secondary to diabetic gastroparesis Gastric emptying study in July 2017 with delayed emptying. IV fluids Reglan Advance diet to clear liquids yesterday, he did not tolerate well with nausea and vomiting  Diabetes type 2 Levemir, NovoLog sliding scale  Hypokalemia Replace, trend  GERD PPI and tums    DVT prophylaxis: lovenox Code Status: Full Family Communication: no family at bedside Disposition Plan: pending improvement   Consultants:   none  Procedures:   none  Antimicrobials:   none    Subjective: Patient states that yesterday, he tried 3 bites of Jell-O and continued to have nausea and vomiting. He admits to left upper quadrant abdominal pain but no epigastric pain. He also admits to heartburn.  Objective: Vitals:   04/08/17 0930 04/08/17 1315 04/08/17 2116 04/09/17 0618  BP: (!) 144/74 136/77 (!) 148/75 132/73  Pulse: 88 89 86 89  Resp: Temp:  98.1 F (36.7 C) 99.6 F (37.6 C) 98.4 F (36.9 C)  TempSrc:  Oral Oral Oral  SpO2:  99% 99% 97%  Weight:    104.9 kg (231 lb 4.2 oz)  Height:        Intake/Output Summary (Last 24 hours) at 04/09/17 1323 Last data filed at 04/09/17 0800  Gross per 24 hour  Intake          1035.83 ml  Output              1825 ml  Net          -789.17 ml   Filed Weights   04/07/17 2059 04/09/17 0618  Weight: 106.6 kg (235 lb) 104.9 kg (231 lb 4.2 oz)    Examination:  General exam: Appears calm and comfortable  Respiratory system: Clear to auscultation. Respiratory effort normal. Cardiovascular system: S1 & S2 heard, RRR. No JVD, murmurs, rubs, gallops or clicks. No pedal edema. Gastrointestinal system: Abdomen is nondistended, soft and nontender to palpation. No organomegaly or masses felt. Normal bowel sounds heard. Central nervous system: Alert and oriented. No focal neurological deficits. Extremities: Symmetric 5 x 5 power. Skin: No rashes, lesions or ulcers Psychiatry: Judgement and insight appear normal. Mood & affect appropriate.   Data Reviewed: I have personally reviewed following labs and imaging studies  CBC:  Recent Labs Lab 04/07/17 2110 04/08/17 0514 04/09/17 0519  WBC 9.9 11.1* 13.0*  NEUTROABS 8.1* 9.4*  --   HGB 13.1 11.3* 12.1*  HCT 36.6* 32.7* 34.6*  MCV 85.7 86.7 86.1  PLT 311 304 327   Basic Metabolic Panel:  Recent Labs Lab 04/07/17 2110 04/08/17 0514 04/08/17 0817 04/09/17 0519  NA 139 139 136 134*  K 3.8 3.5 3.3* 3.4*  CL 107 108 107 104  CO2 18* 19* 20* 22  GLUCOSE 140* 144* 127* 142*  BUN 16  CREATININE 0.93 0.88 0.90 0.79  CALCIUM 9.3 8.5* 8.3* 8.7*  MG  --   --   --  1.7   GFR: Estimated Creatinine Clearance: 150.6 mL/min (by C-G formula based on SCr of 0.79 mg/dL). Liver Function Tests:  Recent Labs Lab 04/07/17 2110  AST 27  ALT 22  ALKPHOS 59  BILITOT 1.4*  PROT 7.7  ALBUMIN 4.6    Recent Labs Lab 04/07/17 2110  LIPASE 16   No results for input(s): AMMONIA in the last 168 hours. Coagulation Profile: No results for input(s): INR, PROTIME in the last 168 hours. Cardiac Enzymes: No results for input(s): CKTOTAL, CKMB, CKMBINDEX, TROPONINI in the last 168 hours. BNP (last 3 results) No results for input(s): PROBNP in the  last 8760 hours. HbA1C: No results for input(s): HGBA1C in the last 72 hours. CBG:  Recent Labs Lab 04/08/17 2113 04/08/17 2333 04/09/17 0345 04/09/17 0726 04/09/17 1133  GLUCAP 128* 124* 127* 149* 147*   Lipid Profile: No results for input(s): CHOL, HDL, LDLCALC, TRIG, CHOLHDL, LDLDIRECT in the last 72 hours. Thyroid Function Tests: No results for input(s): TSH, T4TOTAL, FREET4, T3FREE, THYROIDAB in the last 72 hours. Anemia Panel: No results for input(s): VITAMINB12, FOLATE, FERRITIN, TIBC, IRON, RETICCTPCT in the last 72 hours. Sepsis Labs: No results for input(s): PROCALCITON, LATICACIDVEN in the last 168 hours.  No results found for this or any previous visit (from the past 240 hour(s)).     Radiology Studies: Dg Abd 2 Views  Result Date: 04/07/2017 CLINICAL DATA:  Nausea and vomiting EXAM: ABDOMEN - 2 VIEW COMPARISON:  05/21/2016 FINDINGS: Decubitus view demonstrates no gross free air. Nonobstructed gas pattern. No abnormal calcifications. IMPRESSION: Nonobstructed bowel-gas pattern Electronically Signed   By: Jasmine Pang M.D.   On: 04/07/2017 23:10      Scheduled Meds: . enoxaparin (LOVENOX) injection  40 mg Subcutaneous Q24H  . insulin aspart  0-9 Units Subcutaneous Q4H  . insulin detemir  5 Units Subcutaneous Daily  . metoCLOPramide (REGLAN) injection  5 mg Intravenous Q6H  . pantoprazole  40 mg Oral BID AC   Continuous Infusions:   LOS: 1 day    Time spent: 30 minutes   Noralee Stain, DO [facility] [email or web address] Password [email or web address] 04/09/2017, 1:23 PM

## 2017-04-10 ENCOUNTER — Inpatient Hospital Stay (HOSPITAL_COMMUNITY): Payer: Medicaid Other

## 2017-04-10 ENCOUNTER — Encounter (HOSPITAL_COMMUNITY): Payer: Self-pay | Admitting: Radiology

## 2017-04-10 LAB — GLUCOSE, CAPILLARY
GLUCOSE-CAPILLARY: 123 mg/dL — AB (ref 65–99)
GLUCOSE-CAPILLARY: 102 mg/dL — AB (ref 65–99)
GLUCOSE-CAPILLARY: 112 mg/dL — AB (ref 65–99)
GLUCOSE-CAPILLARY: 112 mg/dL — AB (ref 65–99)
Glucose-Capillary: 111 mg/dL — ABNORMAL HIGH (ref 65–99)
Glucose-Capillary: 129 mg/dL — ABNORMAL HIGH (ref 65–99)

## 2017-04-10 LAB — BASIC METABOLIC PANEL
Anion gap: 9 (ref 5–15)
BUN: 13 mg/dL (ref 6–20)
CALCIUM: 8.9 mg/dL (ref 8.9–10.3)
CO2: 23 mmol/L (ref 22–32)
CREATININE: 0.86 mg/dL (ref 0.61–1.24)
Chloride: 103 mmol/L (ref 101–111)
GFR calc Af Amer: >60 mL/min (ref >60)
GLUCOSE: 115 mg/dL — AB (ref 65–99)
Potassium: 3.2 mmol/L — ABNORMAL LOW (ref 3.5–5.1)
Sodium: 135 mmol/L (ref 135–145)

## 2017-04-10 LAB — CBC
HCT: 35 % — ABNORMAL LOW (ref 39.0–52.0)
Hemoglobin: 11.9 g/dL — ABNORMAL LOW (ref 13.0–17.0)
MCH: 29.5 pg (ref 26.0–34.0)
MCHC: 34 g/dL (ref 30.0–36.0)
MCV: 86.8 fL (ref 78.0–100.0)
Platelets: 272 10*3/uL (ref 150–400)
RBC: 4.03 MIL/uL — ABNORMAL LOW (ref 4.22–5.81)
RDW: 13 % (ref 11.5–15.5)
WBC: 10.8 10*3/uL — ABNORMAL HIGH (ref 4.0–10.5)

## 2017-04-10 LAB — MAGNESIUM: Magnesium: 1.7 mg/dL (ref 1.7–2.4)

## 2017-04-10 MED ORDER — POTASSIUM CHLORIDE CRYS ER 20 MEQ PO TBCR
40.0000 meq | EXTENDED_RELEASE_TABLET | ORAL | Status: AC
  Administered 2017-04-10 (×2): 40 meq via ORAL
  Filled 2017-04-10 (×2): qty 2

## 2017-04-10 MED ORDER — SODIUM CHLORIDE 0.9 % IV SOLN
INTRAVENOUS | Status: DC
  Administered 2017-04-10 – 2017-04-11 (×3): via INTRAVENOUS

## 2017-04-10 MED ORDER — METOCLOPRAMIDE HCL 5 MG/ML IJ SOLN
10.0000 mg | Freq: Four times a day (QID) | INTRAMUSCULAR | Status: DC
  Administered 2017-04-10 – 2017-04-12 (×9): 10 mg via INTRAVENOUS
  Filled 2017-04-10 (×9): qty 2

## 2017-04-10 MED ORDER — IOPAMIDOL (ISOVUE-300) INJECTION 61%
30.0000 mL | Freq: Once | INTRAVENOUS | Status: AC | PRN
  Administered 2017-04-10: 30 mL via ORAL

## 2017-04-10 MED ORDER — IOPAMIDOL (ISOVUE-300) INJECTION 61%
INTRAVENOUS | Status: AC
  Administered 2017-04-10: 100 mL
  Filled 2017-04-10: qty 100

## 2017-04-10 NOTE — Progress Notes (Signed)
PROGRESS NOTE    Travis [name]  ZOX:[id number] DOB: March 17, 1971 DOA: 04/07/2017 PCP: Salli Real, MD     Brief Narrative:  Travis [name] is a 46 y.o. male with history of diabetes mellitus type 2, previous history of osteomyelitis and history of recurrent nausea vomiting from gastroparesis presents to the ER with complaints of persistent vomiting. Has been having some crampy abdominal pain but denies any diarrhea. He was admitted to the hospital for diabetic gastroparesis with intractable nausea and vomiting.  Assessment & Plan:   Principal Problem:   Nausea & vomiting Active Problems:   Diabetes type 2, uncontrolled (HCC)   Nausea and vomiting   Intractable nausea and vomiting likely secondary to diabetic gastroparesis Gastric emptying study in July 2017 with delayed emptying. IV fluids Reglan, increase dose today  Check CT abd/pelvis to r/o other etiology of nausea, vomiting with LUQ pain   Diabetes type 2 Levemir, NovoLog sliding scale  Hypokalemia Replace, trend  GERD PPI and tums    DVT prophylaxis: lovenox Code Status: Full Family Communication: no family at bedside Disposition Plan: pending improvement   Consultants:   none  Procedures:   none  Antimicrobials:   none    Subjective: Continues to have nausea, vomiting, did not tolerate much intake yesterday. Nothing seemed to help    Objective: Vitals:   04/09/17 0618 04/09/17 1408 04/09/17 2005 04/10/17 0415  BP: 132/73 136/77 130/73 (!) 148/73  Pulse: 89 88 90 82  Resp: Temp: 98.4 F (36.9 C) 98.4 F (36.9 C) 99.3 F (37.4 C) 98.4 F (36.9 C)  TempSrc: Oral Oral Oral Oral  SpO2: 97% 97% 99% 99%  Weight: 104.9 kg (231 lb 4.2 oz)   96.4 kg (212 lb 8.4 oz)  Height:        Intake/Output Summary (Last 24 hours) at 04/10/17 1157 Last data filed at 04/10/17 0418  Gross per 24 hour  Intake                0 ml  Output                0 ml  Net                0 ml   Filed  Weights   04/07/17 2059 04/09/17 0618 04/10/17 0415  Weight: 106.6 kg (235 lb) 104.9 kg (231 lb 4.2 oz) 96.4 kg (212 lb 8.4 oz)    Examination:  General exam: Appears calm and comfortable  Respiratory system: Clear to auscultation. Respiratory effort normal. Cardiovascular system: S1 & S2 heard, RRR. No JVD, murmurs, rubs, gallops or clicks. No pedal edema. Gastrointestinal system: Abdomen is nondistended, soft and nontender to palpation. No organomegaly or masses felt. Normal bowel sounds heard. Central nervous system: Alert and oriented. No focal neurological deficits. Extremities: Symmetric 5 x 5 power. Skin: No rashes, lesions or ulcers Psychiatry: Judgement and insight appear normal. Mood & affect appropriate.   Data Reviewed: I have personally reviewed following labs and imaging studies  CBC:  Recent Labs Lab 04/07/17 2110 04/08/17 0514 04/09/17 0519 04/10/17 0527  WBC 9.9 11.1* 13.0* 10.8*  NEUTROABS 8.1* 9.4*  --   --   HGB 13.1 11.3* 12.1* 11.9*  HCT 36.6* 32.7* 34.6* 35.0*  MCV 85.7 86.7 86.1 86.8  PLT 311 304 327 272   Basic Metabolic Panel:  Recent Labs Lab 04/07/17 2110 04/08/17 0514 04/08/17 0817 04/09/17 0519 04/10/17 0527  NA 139 139 136  134* 135  K 3.8 3.5 3.3* 3.4* 3.2*  CL 107 108 107 104 103  CO2 18* 19* 20* 22 23  GLUCOSE 140* 144* 127* 142* 115*  BUN CREATININE 0.93 0.88 0.90 0.79 0.86  CALCIUM 9.3 8.5* 8.3* 8.7* 8.9  MG  --   --   --  1.7 1.7   GFR: Estimated Creatinine Clearance: 126.1 mL/min (by C-G formula based on SCr of 0.86 mg/dL). Liver Function Tests:  Recent Labs Lab 04/07/17 2110  AST 27  ALT 22  ALKPHOS 59  BILITOT 1.4*  PROT 7.7  ALBUMIN 4.6    Recent Labs Lab 04/07/17 2110  LIPASE 16   No results for input(s): AMMONIA in the last 168 hours. Coagulation Profile: No results for input(s): INR, PROTIME in the last 168 hours. Cardiac Enzymes: No results for input(s): CKTOTAL, CKMB, CKMBINDEX,  TROPONINI in the last 168 hours. BNP (last 3 results) No results for input(s): PROBNP in the last 8760 hours. HbA1C: No results for input(s): HGBA1C in the last 72 hours. CBG:  Recent Labs Lab 04/09/17 1553 04/09/17 2044 04/10/17 0113 04/10/17 0423 04/10/17 0813  GLUCAP 157* 122* 123* 111* 102*   Lipid Profile: No results for input(s): CHOL, HDL, LDLCALC, TRIG, CHOLHDL, LDLDIRECT in the last 72 hours. Thyroid Function Tests: No results for input(s): TSH, T4TOTAL, FREET4, T3FREE, THYROIDAB in the last 72 hours. Anemia Panel: No results for input(s): VITAMINB12, FOLATE, FERRITIN, TIBC, IRON, RETICCTPCT in the last 72 hours. Sepsis Labs: No results for input(s): PROCALCITON, LATICACIDVEN in the last 168 hours.  No results found for this or any previous visit (from the past 240 hour(s)).     Radiology Studies: No results found.    Scheduled Meds: . enoxaparin (LOVENOX) injection  40 mg Subcutaneous Q24H  . insulin aspart  0-9 Units Subcutaneous Q4H  . insulin detemir  5 Units Subcutaneous Daily  . metoCLOPramide (REGLAN) injection  10 mg Intravenous Q6H  . pantoprazole  40 mg Oral BID AC  . potassium chloride  40 mEq Oral Q4H   Continuous Infusions:   LOS: 2 days    Time spent: 30 minutes   Noralee Stain, DO [facility] [email or web address] Password [facility] 04/10/2017, 11:57 AM

## 2017-04-10 NOTE — Plan of Care (Signed)
Problem: Pain Managment: Goal: General experience of comfort will improve Outcome: Not Progressing Reglan dose increased by MD

## 2017-04-11 LAB — BASIC METABOLIC PANEL
ANION GAP: 9 (ref 5–15)
BUN: 14 mg/dL (ref 6–20)
CALCIUM: 8.5 mg/dL — AB (ref 8.9–10.3)
CO2: 22 mmol/L (ref 22–32)
Chloride: 104 mmol/L (ref 101–111)
Creatinine, Ser: 0.73 mg/dL (ref 0.61–1.24)
GFR calc Af Amer: >60 mL/min (ref >60)
Glucose, Bld: 113 mg/dL — ABNORMAL HIGH (ref 65–99)
POTASSIUM: 3.6 mmol/L (ref 3.5–5.1)
SODIUM: 135 mmol/L (ref 135–145)

## 2017-04-11 LAB — GLUCOSE, CAPILLARY
GLUCOSE-CAPILLARY: 93 mg/dL (ref 65–99)
Glucose-Capillary: 102 mg/dL — ABNORMAL HIGH (ref 65–99)
Glucose-Capillary: 102 mg/dL — ABNORMAL HIGH (ref 65–99)
Glucose-Capillary: 107 mg/dL — ABNORMAL HIGH (ref 65–99)
Glucose-Capillary: 120 mg/dL — ABNORMAL HIGH (ref 65–99)
Glucose-Capillary: 134 mg/dL — ABNORMAL HIGH (ref 65–99)

## 2017-04-11 LAB — MAGNESIUM: MAGNESIUM: 1.8 mg/dL (ref 1.7–2.4)

## 2017-04-11 MED ORDER — INSULIN ASPART 100 UNIT/ML ~~LOC~~ SOLN: 0.0000 [IU] | Freq: Three times a day (TID) | SUBCUTANEOUS | Status: DC

## 2017-04-11 NOTE — Progress Notes (Signed)
PROGRESS NOTE    MARGIE BRINK  UJW:[id number] DOB: February 15, 1971 DOA: 04/07/2017 PCP: Salli Real, MD     Brief Narrative:  Travis [name] is a 46 y.o. male with history of diabetes mellitus type 2, previous history of osteomyelitis and history of recurrent nausea vomiting from gastroparesis presents to the ER with complaints of persistent vomiting. Has been having some crampy abdominal pain but denies any diarrhea. He was admitted to the hospital for diabetic gastroparesis with intractable nausea and vomiting.  Assessment & Plan:   Principal Problem:   Nausea & vomiting Active Problems:   Diabetes type 2, uncontrolled (HCC)   Nausea and vomiting   Intractable nausea and vomiting likely secondary to diabetic gastroparesis Gastric emptying study in July 2017 with delayed emptying CT abd/pelvis unremarkable  Reglan, some improvement with increase dose in reglan   Diabetes type 2 Levemir, NovoLog sliding scale  GERD PPI and tums    DVT prophylaxis: lovenox Code Status: Full Family Communication: family at bedside Disposition Plan: pending improvement   Consultants:   none  Procedures:   none  Antimicrobials:   none    Subjective: States he feels a little bit better, wants to try some soft food.    Objective: Vitals:   04/10/17 0415 04/10/17 1440 04/10/17 2029 04/11/17 0409  BP: (!) 148/73 137/78 132/75 (!) 177/85  Pulse: 82 88 83 83  Resp: Temp: 98.4 F (36.9 C) 98.4 F (36.9 C) 98.4 F (36.9 C) 98.3 F (36.8 C)  TempSrc: Oral Oral Oral Oral  SpO2: 99% 100% 99% 100%  Weight: 96.4 kg (212 lb 8.4 oz)     Height:        Intake/Output Summary (Last 24 hours) at 04/11/17 1330 Last data filed at 04/11/17 0753  Gross per 24 hour  Intake             2320 ml  Output             1350 ml  Net              970 ml   Filed Weights   04/07/17 2059 04/09/17 0618 04/10/17 0415  Weight: 106.6 kg (235 lb) 104.9 kg (231 lb 4.2 oz) 96.4 kg (212 lb  8.4 oz)    Examination:  General exam: Appears calm and comfortable  Respiratory system: Clear to auscultation. Respiratory effort normal. Cardiovascular system: S1 & S2 heard, RRR. No JVD, murmurs, rubs, gallops or clicks. No pedal edema. Gastrointestinal system: Abdomen is nondistended, soft and nontender to palpation. No organomegaly or masses felt. Normal bowel sounds heard. Central nervous system: Alert and oriented. No focal neurological deficits. Extremities: Symmetric 5 x 5 power. Skin: No rashes, lesions or ulcers Psychiatry: Judgement and insight appear normal. Mood & affect appropriate.   Data Reviewed: I have personally reviewed following labs and imaging studies  CBC:  Recent Labs Lab 04/07/17 2110 04/08/17 0514 04/09/17 0519 04/10/17 0527  WBC 9.9 11.1* 13.0* 10.8*  NEUTROABS 8.1* 9.4*  --   --   HGB 13.1 11.3* 12.1* 11.9*  HCT 36.6* 32.7* 34.6* 35.0*  MCV 85.7 86.7 86.1 86.8  PLT 311 304 327 272   Basic Metabolic Panel:  Recent Labs Lab 04/08/17 0514 04/08/17 0817 04/09/17 0519 04/10/17 0527 04/11/17 0535  NA 139 136 134* 135 135  K 3.5 3.3* 3.4* 3.2* 3.6  CL 108 107 104 103 104  CO2 19* 20* GLUCOSE 144* 127*  142* 115* 113*  BUN CREATININE 0.88 0.90 0.79 0.86 0.73  CALCIUM 8.5* 8.3* 8.7* 8.9 8.5*  MG  --   --  1.7 1.7 1.8   GFR: Estimated Creatinine Clearance: 135.6 mL/min (by C-G formula based on SCr of 0.73 mg/dL). Liver Function Tests:  Recent Labs Lab 04/07/17 2110  AST 27  ALT 22  ALKPHOS 59  BILITOT 1.4*  PROT 7.7  ALBUMIN 4.6    Recent Labs Lab 04/07/17 2110  LIPASE 16   No results for input(s): AMMONIA in the last 168 hours. Coagulation Profile: No results for input(s): INR, PROTIME in the last 168 hours. Cardiac Enzymes: No results for input(s): CKTOTAL, CKMB, CKMBINDEX, TROPONINI in the last 168 hours. BNP (last 3 results) No results for input(s): PROBNP in the last 8760 hours. HbA1C: No  results for input(s): HGBA1C in the last 72 hours. CBG:  Recent Labs Lab 04/10/17 2028 04/11/17 0030 04/11/17 0405 04/11/17 0735 04/11/17 1209  GLUCAP 112* 102* 93 107* 120*   Lipid Profile: No results for input(s): CHOL, HDL, LDLCALC, TRIG, CHOLHDL, LDLDIRECT in the last 72 hours. Thyroid Function Tests: No results for input(s): TSH, T4TOTAL, FREET4, T3FREE, THYROIDAB in the last 72 hours. Anemia Panel: No results for input(s): VITAMINB12, FOLATE, FERRITIN, TIBC, IRON, RETICCTPCT in the last 72 hours. Sepsis Labs: No results for input(s): PROCALCITON, LATICACIDVEN in the last 168 hours.  No results found for this or any previous visit (from the past 240 hour(s)).     Radiology Studies: Ct Abdomen Pelvis W Contrast  Result Date: 04/10/2017 CLINICAL DATA:  Recurrent nausea and vomiting. Crampy abdominal pain. Gastroparesis. EXAM: CT ABDOMEN AND PELVIS WITH CONTRAST TECHNIQUE: Multidetector CT imaging of the abdomen and pelvis was performed using the standard protocol following bolus administration of intravenous contrast. CONTRAST:  ISOVUE-300 IOPAMIDOL (ISOVUE-300) INJECTION 61% COMPARISON:  Radiography 04/07/2018 FINDINGS: Lower chest: Normal Hepatobiliary: Normal Pancreas: Normal Spleen: Normal Adrenals/Urinary Tract: Adrenal glands are normal. Kidneys are normal. No cyst, mass, stone or hydronephrosis. Bladder is normal. Stomach/Bowel: Normal Vascular/Lymphatic: Mild aortic atherosclerosis, iliac atherosclerosis and aortic branch vessel atherosclerosis, predominant in the iliac vessels can inferior mesenteric artery as can be seen with diabetes. Reproductive: Normal except for vas deferens calcification, typical of diabetes. Other: No free fluid or air. Musculoskeletal: Normal IMPRESSION: No acute or significant finding. Vascular calcification and vas deferens calcification typical of diabetes. Electronically Signed   By: Paulina Fusi M.D.   On: 04/10/2017 14:49       Scheduled Meds: . enoxaparin (LOVENOX) injection  40 mg Subcutaneous Q24H  . insulin aspart  0-9 Units Subcutaneous TID WC  . insulin detemir  5 Units Subcutaneous Daily  . metoCLOPramide (REGLAN) injection  10 mg Intravenous Q6H  . pantoprazole  40 mg Oral BID AC   Continuous Infusions:   LOS: 3 days    Time spent: 30 minutes   Noralee Stain, DO [facility] [email or web address] Password [email or web address] 04/11/2017, 1:30 PM

## 2017-04-12 LAB — GLUCOSE, CAPILLARY
GLUCOSE-CAPILLARY: 101 mg/dL — AB (ref 65–99)
Glucose-Capillary: 158 mg/dL — ABNORMAL HIGH (ref 65–99)

## 2017-04-12 MED ORDER — METOCLOPRAMIDE HCL 10 MG PO TABS: 10.0000 mg | ORAL_TABLET | Freq: Three times a day (TID) | ORAL | 1 refills | Status: DC

## 2017-04-12 MED ORDER — POLYETHYLENE GLYCOL 3350 17 G PO PACK: 17.0000 g | PACK | Freq: Every day | ORAL | Status: DC

## 2017-04-12 MED ORDER — ONDANSETRON 4 MG PO TBDP: 4.0000 mg | ORAL_TABLET | Freq: Three times a day (TID) | ORAL | 0 refills | Status: DC | PRN

## 2017-04-12 MED ORDER — PANTOPRAZOLE SODIUM 40 MG PO TBEC: 40.0000 mg | DELAYED_RELEASE_TABLET | Freq: Two times a day (BID) | ORAL | 0 refills | Status: DC

## 2017-04-12 NOTE — Discharge Instructions (Signed)
Gastroparesis Gastroparesis, also called delayed gastric emptying, is a condition in which food takes longer than normal to empty from the stomach. The condition is usually long-lasting (chronic). What are the causes? This condition may be caused by:  An endocrine disorder, such as hypothyroidism or diabetes. Diabetes is the most common cause of this condition.  A nervous system disease, such as Parkinson disease or multiple sclerosis.  Cancer, infection, or surgery of the stomach or vagus nerve.  A connective tissue disorder, such as scleroderma.  Certain medicines. In most cases, the cause is not known. What increases the risk? This condition is more likely to develop in:  People with certain disorders, including endocrine disorders, eating disorders, amyloidosis, and scleroderma.  People with certain diseases, including Parkinson disease or multiple sclerosis.  People with cancer or infection of the stomach or vagus nerve.  People who have had surgery on the stomach or vagus nerve.  People who take certain medicines.  Women. What are the signs or symptoms? Symptoms of this condition include:  An early feeling of fullness when eating.  Nausea.  Weight loss.  Vomiting.  Heartburn.  Abdominal bloating.  Inconsistent blood glucose levels.  Lack of appetite.  Acid from the stomach coming up into the esophagus (gastroesophageal reflux).  Spasms of the stomach. Symptoms may come and go. How is this diagnosed? This condition is diagnosed with tests, such as:  Tests that check how long it takes food to move through the stomach and intestines. These tests include:  Upper gastrointestinal (GI) series. In this test, X-rays of the intestines are taken after you drink a liquid. The liquid makes the intestines show up better on the X-rays.  Gastric emptying scintigraphy. In this test, scans are taken after you eat food that contains a small amount of radioactive  material.  Wireless capsule GI monitoring system. This test involves swallowing a capsule that records information about movement through the stomach.  Gastric manometry. This test measures electrical and muscular activity in the stomach. It is done with a thin tube that is passed down the throat and into the stomach.  Endoscopy. This test checks for abnormalities in the lining of the stomach. It is done with a long, thin tube that is passed down the throat and into the stomach.  An ultrasound. This test can help rule out gallbladder disease or pancreatitis as a cause of your symptoms. It uses sound waves to take pictures of the inside of your body. How is this treated? There is no cure for gastroparesis. This condition may be managed with:  Treatment of the underlying condition causing the gastroparesis.  Lifestyle changes, including exercise and dietary changes. Dietary changes can include:  Changes in what and when you eat.  Eating smaller meals more often.  Eating low-fat foods.  Eating low-fiber forms of high-fiber foods, such as cooked vegetables instead of raw vegetables.  Having liquid foods in place of solid foods. Liquid foods are easier to digest.  Medicines. These may be given to control nausea and vomiting and to stimulate stomach muscles.  Getting food through a feeding tube. This may be done in severe cases.  A gastric neurostimulator. This is a device that is inserted into the body with surgery. It helps improve stomach emptying and control nausea and vomiting. Follow these instructions at home:  Follow your health care provider's instructions about exercise and diet.  Take medicines only as directed by your health care provider. Contact a health care provider if:    Your symptoms do not improve with treatment.  You have new symptoms. Get help right away if:  You have severe abdominal pain that does not improve with treatment.  You have nausea that does not  go away.  You cannot keep fluids down. This information is not intended to replace advice given to you by your health care provider. Make sure you discuss any questions you have with your health care provider. Document Released: 11/29/2005 Document Revised: 05/06/2016 Document Reviewed: 11/25/2014 Elsevier Interactive Patient Education  2017 [employer]  

## 2017-04-12 NOTE — Progress Notes (Signed)
Pt discharged to home, instructions given, acknowledge understanding. SRP, RN 

## 2017-04-12 NOTE — Progress Notes (Signed)
Pt stable at current time, no nausea or vomiting noted. Will Cont to monitor. SRP RN

## 2017-04-12 NOTE — Discharge Summary (Signed)
Physician Discharge Summary  Travis [name] IEP:[id number] DOB: [birth year] DOA: 04/07/2017  PCP: Salli Real, MD  Admit date: 04/07/2017 Discharge date: 04/12/2017  Admitted From: home Disposition:  home  Recommendations for Outpatient Follow-up:  1. Follow up with PCP in 1 week  Discharge Condition: stable CODE STATUS: full  Diet recommendation: soft diet, advance as tolerated to carb modified diet   Brief/Interim Summary: Travis [name] Knightis a 45 y.o.malewith history of diabetes mellitus type 2, previous history of osteomyelitis and history of recurrent nausea vomiting from gastroparesis presents to the ER with complaints of persistent vomiting. Has been having some crampy abdominal pain but denies any diarrhea. He was admitted to the hospital for diabetic gastroparesis with intractable nausea and vomiting. He underwent CT abdomen and pelvis which was unremarkable. He was treated with IV Reglan with very slow improvement. He was able to tolerate soft foods prior to discharge.  Discharge Diagnoses:  Principal Problem:   Nausea & vomiting Active Problems:   Diabetes type 2, uncontrolled (HCC)   Nausea and vomiting  Intractable nausea and vomiting likely secondary to diabetic gastroparesis Gastric emptying study in July 2017 with delayed emptying CT abd/pelvis unremarkable  Reglan, some improvement with increase dose in reglan    Diabetes type 2 Resume home regimen   GERD PPI and tums    Discharge Instructions  Discharge Instructions    Call MD for:  extreme fatigue    Complete by:  As directed    Call MD for:  persistant dizziness or light-headedness    Complete by:  As directed    Call MD for:  persistant nausea and vomiting    Complete by:  As directed    Call MD for:  severe uncontrolled pain    Complete by:  As directed    Call MD for:  temperature >100.4    Complete by:  As directed    Discharge instructions    Complete by:  As directed    You were cared for by  a hospitalist during your hospital stay. If you have any questions about your discharge medications or the care you received while you were in the hospital after you are discharged, you can call the unit and asked to speak with the hospitalist on call if the hospitalist that took care of you is not available. Once you are discharged, your primary care physician will handle any further medical issues. Please note that NO REFILLS for any discharge medications will be authorized once you are discharged, as it is imperative that you return to your primary care physician (or establish a relationship with a primary care physician if you do not have one) for your aftercare needs so that they can reassess your need for medications and monitor your lab values.   Increase activity slowly    Complete by:  As directed      Allergies as of 04/12/2017      Reactions   No Known Allergies       Medication List    STOP taking these medications   HYDROcodone-acetaminophen 5-325 MG tablet Commonly known as:  NORCO/VICODIN     TAKE these medications   BYDUREON 2 MG Pen Generic drug:  Exenatide ER Inject 2 mg as directed every 7 (seven) days.   gabapentin 600 MG tablet Commonly known as:  NEURONTIN Take 1,800 mg by mouth at bedtime.   insulin aspart 100 UNIT/ML FlexPen Commonly known as:  NOVOLOG Inject 0-15 Units into the skin 4 (four)  times daily -  with meals and at bedtime. Dose based on blood sugars taken before meals&bedtime.   insulin detemir 100 UNIT/ML injection Commonly known as:  LEVEMIR Inject 0.2 mLs (20 Units total) into the skin at bedtime.   metFORMIN 1000 MG tablet Commonly known as:  GLUCOPHAGE Take 1,000 mg by mouth 2 (two) times daily.   metoCLOPramide 10 MG tablet Commonly known as:  REGLAN Take 1 tablet (10 mg total) by mouth 4 (four) times daily -  before meals and at bedtime. What changed:  when to take this   ondansetron 4 MG disintegrating tablet Commonly known as:   ZOFRAN ODT Take 1 tablet (4 mg total) by mouth every 8 (eight) hours as needed for nausea or vomiting.   pantoprazole 40 MG tablet Commonly known as:  PROTONIX Take 1 tablet (40 mg total) by mouth 2 (two) times daily before a meal.      Follow-up Information    Salli Real, MD. Schedule an appointment as soon as possible for a visit in 1 week(s).   Specialty:  Internal Medicine Contact information: [address] [place] [address] [phone]          Allergies  Allergen Reactions  . No Known Allergies     Consultations:  None   Procedures/Studies: Ct Abdomen Pelvis W Contrast  Result Date: 04/10/2017 CLINICAL DATA:  Recurrent nausea and vomiting. Crampy abdominal pain. Gastroparesis. EXAM: CT ABDOMEN AND PELVIS WITH CONTRAST TECHNIQUE: Multidetector CT imaging of the abdomen and pelvis was performed using the standard protocol following bolus administration of intravenous contrast. CONTRAST:  ISOVUE-300 IOPAMIDOL (ISOVUE-300) INJECTION 61% COMPARISON:  Radiography 04/07/2018 FINDINGS: Lower chest: Normal Hepatobiliary: Normal Pancreas: Normal Spleen: Normal Adrenals/Urinary Tract: Adrenal glands are normal. Kidneys are normal. No cyst, mass, stone or hydronephrosis. Bladder is normal. Stomach/Bowel: Normal Vascular/Lymphatic: Mild aortic atherosclerosis, iliac atherosclerosis and aortic branch vessel atherosclerosis, predominant in the iliac vessels can inferior mesenteric artery as can be seen with diabetes. Reproductive: Normal except for vas deferens calcification, typical of diabetes. Other: No free fluid or air. Musculoskeletal: Normal IMPRESSION: No acute or significant finding. Vascular calcification and vas deferens calcification typical of diabetes. Electronically Signed   By: Paulina Fusi M.D.   On: 04/10/2017 14:49   Dg Abd 2 Views  Result Date: 04/07/2017 CLINICAL DATA:  Nausea and vomiting EXAM: ABDOMEN - 2 VIEW COMPARISON:  05/21/2016 FINDINGS:  Decubitus view demonstrates no gross free air. Nonobstructed gas pattern. No abnormal calcifications. IMPRESSION: Nonobstructed bowel-gas pattern Electronically Signed   By: Jasmine Pang M.D.   On: 04/07/2017 23:10        Discharge Exam: Vitals:   04/11/17 2103 04/12/17 0556  BP: (!) 152/81 (!) 146/79  Pulse: 79 79  Resp: 18 18  Temp: 98.2 F (36.8 C) 98.6 F (37 C)   Vitals:   04/10/17 2029 04/11/17 0409 04/11/17 2103 04/12/17 0556  BP: 132/75 (!) 177/85 (!) 152/81 (!) 146/79  Pulse: 83 83 79 79  Resp: Temp: 98.4 F (36.9 C) 98.3 F (36.8 C) 98.2 F (36.8 C) 98.6 F (37 C)  TempSrc: Oral Oral Oral Oral  SpO2: 99% 100% 100% 100%  Weight:      Height:         General: Pt is alert, awake, not in acute distress Cardiovascular: RRR, S1/S2 +, no rubs, no gallops Respiratory: CTA bilaterally, no wheezing, no rhonchi Abdominal: Soft, NT, ND, bowel sounds + Extremities: no edema, no cyanosis  The results of significant diagnostics from this hospitalization (including imaging, microbiology, ancillary and laboratory) are listed below for reference.     Microbiology: No results found for this or any previous visit (from the past 240 hour(s)).   Labs: BNP (last 3 results) No results for input(s): BNP in the last 8760 hours. Basic Metabolic Panel:  Recent Labs Lab 04/08/17 0514 04/08/17 0817 04/09/17 0519 04/10/17 0527 04/11/17 0535  NA 139 136 134* 135 135  K 3.5 3.3* 3.4* 3.2* 3.6  CL 108 107 104 103 104  CO2 19* 20* GLUCOSE 144* 127* 142* 115* 113*  BUN CREATININE 0.88 0.90 0.79 0.86 0.73  CALCIUM 8.5* 8.3* 8.7* 8.9 8.5*  MG  --   --  1.7 1.7 1.8   Liver Function Tests:  Recent Labs Lab 04/07/17 2110  AST 27  ALT 22  ALKPHOS 59  BILITOT 1.4*  PROT 7.7  ALBUMIN 4.6    Recent Labs Lab 04/07/17 2110  LIPASE 16   No results for input(s): AMMONIA in the last 168 hours. CBC:  Recent Labs Lab  04/07/17 2110 04/08/17 0514 04/09/17 0519 04/10/17 0527  WBC 9.9 11.1* 13.0* 10.8*  NEUTROABS 8.1* 9.4*  --   --   HGB 13.1 11.3* 12.1* 11.9*  HCT 36.6* 32.7* 34.6* 35.0*  MCV 85.7 86.7 86.1 86.8  PLT 311 304 327 272   Cardiac Enzymes: No results for input(s): CKTOTAL, CKMB, CKMBINDEX, TROPONINI in the last 168 hours. BNP: Invalid input(s): POCBNP CBG:  Recent Labs Lab 04/11/17 0735 04/11/17 1209 04/11/17 1649 04/11/17 2102 04/12/17 0802  GLUCAP 107* 120* 102* 134* 101*   D-Dimer No results for input(s): DDIMER in the last 72 hours. Hgb A1c No results for input(s): HGBA1C in the last 72 hours. Lipid Profile No results for input(s): CHOL, HDL, LDLCALC, TRIG, CHOLHDL, LDLDIRECT in the last 72 hours. Thyroid function studies No results for input(s): TSH, T4TOTAL, T3FREE, THYROIDAB in the last 72 hours.  Invalid input(s): FREET3 Anemia work up No results for input(s): VITAMINB12, FOLATE, FERRITIN, TIBC, IRON, RETICCTPCT in the last 72 hours. Urinalysis    Component Value Date/Time   COLORURINE YELLOW 04/07/2017 2103   APPEARANCEUR CLEAR 04/07/2017 2103   LABSPEC 1.025 04/07/2017 2103   PHURINE 5.0 04/07/2017 2103   GLUCOSEU 50 (A) 04/07/2017 2103   HGBUR SMALL (A) 04/07/2017 2103   HGBUR negative 09/17/2009 0834   BILIRUBINUR NEGATIVE 04/07/2017 2103   KETONESUR 80 (A) 04/07/2017 2103   PROTEINUR 100 (A) 04/07/2017 2103   UROBILINOGEN 1.0 04/30/2016 1338   NITRITE NEGATIVE 04/07/2017 2103   LEUKOCYTESUR NEGATIVE 04/07/2017 2103   Sepsis Labs Invalid input(s): PROCALCITONIN,  WBC,  LACTICIDVEN Microbiology No results found for this or any previous visit (from the past 240 hour(s)).   Time coordinating discharge: 40 minutes  SIGNED:  Noralee Stain, DO [facility] Pager [phone]  If 7PM-7AM, please contact night-coverage [email or web address] Password [email or web address] 04/12/2017, 10:08 AM

## 2017-04-25 ENCOUNTER — Ambulatory Visit: Payer: Medicaid Other | Attending: Neurology | Admitting: Rehabilitation

## 2017-04-25 ENCOUNTER — Encounter: Payer: Self-pay | Admitting: Rehabilitation

## 2017-04-25 DIAGNOSIS — M6281 Muscle weakness (generalized): Secondary | ICD-10-CM

## 2017-04-25 DIAGNOSIS — R2689 Other abnormalities of gait and mobility: Secondary | ICD-10-CM

## 2017-04-25 DIAGNOSIS — R208 Other disturbances of skin sensation: Secondary | ICD-10-CM

## 2017-04-25 DIAGNOSIS — R2681 Unsteadiness on feet: Secondary | ICD-10-CM

## 2017-04-25 NOTE — Therapy (Signed)
[facility] [address] [place], [place], [address] Phone: [phone]   Fax:  [phone]  Physical Therapy Treatment  Patient Details  Name: Travis [name] MRN: [id number] Date of Birth: 1971/02/08 Referring Provider: Marcial Pacas, MD  Encounter Date: 04/25/2017      PT End of Session - 04/25/17 1626    Visit Number 2   Number of Visits 4   Date for PT Re-Evaluation 06/12/17   Authorization Type MCD-(CCME approved 3 visits from 4/13-7/12)   Authorization - Visit Number 1   Authorization - Number of Visits 3   PT Start Time 1031   PT Stop Time 1400   PT Time Calculation (min) 43 min   Activity Tolerance Patient tolerated treatment well   Behavior During Therapy [facility] for tasks assessed/performed      Past Medical History:  Diagnosis Date  . Acute osteomyelitis of metatarsal bone of left foot ([place]) 11/12/2015  . Closed fracture of right tibial plateau 11/11/2015  . Depression with anxiety   . Diabetes mellitus    INSULIN DEPENDENT Type II  . Diabetic peripheral neuropathy associated with type 2 diabetes mellitus ([place]) 08/30/2008   Qualifier: Diagnosis of  By: [name] Done FNP, Tori Milks    . Diabetic ulcer of left foot ([place]) 11/14/2015  . Gastroparesis   . GERD (gastroesophageal reflux disease)   . Osteomyelitis ([place])   . Osteoporosis 11/12/2015  . Peripheral neuropathy   . Shortness of breath dyspnea    with exertion  . Vitamin D deficiency 11/12/2015    Past Surgical History:  Procedure Laterality Date  . AMPUTATION Left 11/28/2015   Procedure: Left 4th Ray Amputation;  Surgeon: [name], MD;  Location: [facility];  Service: Orthopedics;  Laterality: Left;  . AMPUTATION Left 10/29/2016   Procedure: LEFT 5TH RAY AMPUTATION;  Surgeon: [name], MD;  Location: [facility] [place];  Service: Orthopedics;  Laterality: Left;  . ANTERIOR VITRECTOMY Left 04/12/2016   Procedure: ANTERIOR CHAMBER [place] OUT WITH GAS FLUID EXCHANGE;   Surgeon: [name], MD;  Location: [facility];  Service: Ophthalmology;  Laterality: Left;  . CATARACT EXTRACTION Left   . EYE SURGERY     left eye surgery 04/2016  . KNEE SURGERY Left   . SHOULDER SURGERY Right   . TOE AMPUTATION Left 11/28/15   4th toe    There were no vitals filed for this visit.      Subjective Assessment - 04/25/17 1320    Subjective Note recent hospital stay with diabetic gastroparesis.  Feeling better, but feels that he got weaker.     Currently in Pain? No/denies                         [facility] Adult PT Treatment/Exercise - 04/25/17 0001      Ambulation/Gait   Ambulation/Gait Yes   Ambulation/Gait Assistance 5: Supervision   Ambulation/Gait Assistance Details Assessed gait with SPC during session as pt stating he would like to purchase one for extra support in times of feeling weaker.  Provided demonstration and verbal cues on how to sequence cane when ambulating as well as how to adjust cane based on his height.  Recommend he ensure that it adjusts to his height before he leaves store as he is very tall.     Ambulation Distance (Feet) 300 Feet   Assistive device Straight cane   Gait Pattern Step-through pattern;Decreased stride length;Decreased dorsiflexion - left;Decreased weight shift  to left;Left steppage;Lateral hip instability;Trunk flexed;Poor foot clearance - left   Ambulation Surface Level;Indoor     Neuro Re-ed    Neuro Re-ed Details  Worked on high level balance and went over HEP, see pt instruction for details on exercises and reps performed.  Also worked on ramp without mat standing with feet apart maintaining balance x 2 sets of 30 secs progressing to marching in place x 10 reps with min A intermittently and cues for wider stance.  Ended with feet in staggered position holding each x 30 secs with min/guard to min A and cues on how to distribute weight for better balance.       Exercises   Exercises Other Exercises   Other  Exercises  See pt instruction for quadruped exercises performed.                 PT Education - 04/25/17 1626    Education provided Yes   Education Details additions to [employer]) Educated Patient   Methods Explanation;Demonstration;Handout   Comprehension Returned demonstration;Verbalized understanding          PT Short Term Goals - 04/25/17 1628      PT SHORT TERM GOAL #1   Title Pt will report compliance with initial HEP in order to indicate decreased fall risk and improved functional strength.  (Target Date:  Following 1st visit after eval)   Baseline met 04/25/17   Status Achieved     PT SHORT TERM GOAL #2   Title Will assess need for L foot up brace/AFO in order to decrease fall risk.    Baseline Note L foot drop with gait compensations during gait on eval   Status Unable to assess     PT SHORT TERM GOAL #3   Title Will assess need/appropriateness for hurry cane/quad tip cane to improve safety with gait.    Baseline Went over gait with SPC and feel that he is safe with this device and he is planning to purchase. 04/25/17   Status Achieved           PT Long Term Goals - 03/14/17 0951      PT LONG TERM GOAL #1   Title Pt will be indepenent with final HEP in order to indicate improved functional strength and decreased fall risk.  (Target Date:  Following 3rd visit after eval)   Baseline dependent    Status New     PT LONG TERM GOAL #2   Title Pt will improve BERG balance score by 4 points in order to indicate decreased fall risk.     Baseline unable to assess due to time   Status New     PT LONG TERM GOAL #3   Title Pt will ambulate up to 300' w/ LRAD over indoor surfaces (including making turns) at mod I level in order to indicate safe home and work negotiation.     Baseline 115' at S level without AD    Status New     PT LONG TERM GOAL #4   Title Will assess ability to use rollator and/or push w/c in order to attend leisure activities with family  at mod I level.     Baseline Does not go to events/functions with family that involve longer distances due to fatigue and balance.    Status New     PT LONG TERM GOAL #5   Title Pt will ambulate up to 500' w/ LRAD at mod I level over unlevel  paved surfaces (including ramp/curb) in order to indicate safe return to community and work.     Baseline Did not assess outdoor gait due to time, see above for indoor gait.    Status New               Plan - 04/25/17 1627    Clinical Impression Statement Skilled session focused on going over current HEP, modifying as needed and adding increased challenge as well as strengthening exercises.  Pt tolerated well, but continues to be limited by severe neuropathy and recent ray amputation.     Rehab Potential Good   Clinical Impairments Affecting Rehab Potential mcd visit limitations   PT Frequency [place]   PT Duration 12 weeks   PT Treatment/Interventions ADLs/Self Care Home Management;DME Instruction;Gait training;Stair training;Functional mobility training;Therapeutic activities;Therapeutic exercise;Balance training;Neuromuscular re-education;Patient/family education;Orthotic Fit/Training;Vestibular   PT Next Visit Plan BERG, try foot up brace, HEP compliance-add balance, strength and hip stretches   PT Home Exercise Plan initial HEP given 03/14/17, additions to HEP 04/25/17   Consulted and Agree with Plan of Care Patient      Patient will benefit from skilled therapeutic intervention in order to improve the following deficits and impairments:  Abnormal gait, Decreased activity tolerance, Decreased balance, Decreased coordination, Decreased endurance, Decreased knowledge of use of DME, Decreased mobility, Decreased range of motion, Decreased strength, Impaired perceived functional ability, Impaired flexibility, Impaired sensation, Impaired UE functional use, Postural dysfunction  Visit Diagnosis: Unsteadiness on feet  Muscle weakness  (generalized)  Other abnormalities of gait and mobility  Other disturbances of skin sensation     Problem List Patient Active Problem List   Diagnosis Date Noted  . Nausea and vomiting 04/08/2017  . Foot amputation status, left ([place]) 11/19/2016  . Short Achilles tendon (acquired), left ankle 11/19/2016  . Subacute osteomyelitis of left foot ([place]) 10/20/2016  . Gait abnormality 10/06/2016  . DKA (diabetic ketoacidoses) ([place]) 05/21/2016  . Nausea & vomiting 05/21/2016  . Ketoacidosis 05/21/2016  . Malnutrition of moderate degree 05/21/2016  . Gastroparesis due to DM ([place])   . Increased intraocular pressure 04/12/2016  . Post-op bleeding 11/29/2015  . Diabetic ulcer of left foot ([place]) 11/14/2015  . Acute osteomyelitis of metatarsal bone of left foot ([place]), Left 4th metatarsal 11/12/2015  . Vitamin D deficiency 11/12/2015  . Osteoporosis 11/12/2015  . Closed fracture of right tibial plateau 11/11/2015  . Visual disturbance 11/03/2015  . Neuropathy 11/03/2015  . Gastroesophageal reflux disease without esophagitis 11/03/2015  . 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 ALLERGIC RHINITIS 12/11/2008  . Diabetic peripheral neuropathy associated with type 2 diabetes mellitus ([place]) 08/30/2008  . CERVICALGIA 07/19/2008  . INSOMNIA 08/24/2007  . Dyslipidemia 08/10/2007  . Diabetes type 2, uncontrolled ([place]) 08/09/2007   Cameron Sprang, PT, MPT [facility] [address] [place], [place], [address] Phone: [phone]   Fax:  [phone] 04/25/17, 4:34 PM  Name: Travis [name] MRN: [id number] Date of Birth: 1971-11-11

## 2017-04-25 NOTE — Patient Instructions (Addendum)
Functional Quadriceps: Sit to Stand    Sit on edge of chair, feet flat on floor. Stand upright, extending knees fully. Repeat _10_ times per set. Do __1-2__ sets per session. Do __1-2__ sessions per day.  http://orth.exer.us/734   Copyright  VHI. All rights reserved.    Tandem Stance    Hold onto counter for support.  Right foot in front of left, heel touching toe both feet "straight ahead". Stand on Foot Triangle of Support with both feet. Balance in this position _10-15__ seconds. Do with left foot in front of right. Repeat each side x 3 reps.  Use hands as little as possible.   Copyright  VHI. All rights reserved.   Heel Raise: Bilateral (Standing)    Rise on balls of feet. Repeat _10___ times per set. Do _1-2___ sets per session. Do _1-2___ sessions per day.  http://orth.exer.us/38   Copyright  VHI. All rights reserved.    For these last two:  Stand in corner with a chair in front of you for safety.     Feet Apart, Arm Motion - Eyes Closed    With eyes closed and feet shoulder width apart, keep your arms by your side.  Repeat __3__ times per session 15 secs each. Do __2__ sessions per day.  Copyright  VHI. All rights reserved.   Feet Together, Head Motion - Eyes Open    With eyes open, feet together, move head slowly: up and down x 10 reps, side to side x 10 reps.   Do __1-2__ sessions per day.  Copyright  VHI. All rights reserved.   Feet Apart (Compliant Surface) Varied Arm Positions - Eyes Closed    Stand on compliant surface: ________ with feet shoulder width and arms by your side. Close eyes and visualize upright position. Hold__30__ seconds. Repeat __3_ times per session. Do __1-2__ sessions per day.  Copyright  VHI. All rights reserved.    Feet Together (Compliant Surface) Varied Arm Positions - Eyes Open    With eyes open, standing on compliant surface: ___pillow_____, feet together and arms out, look at a stationary  object. Hold __30__ seconds. Repeat _3___ times per session. Do _1-2___ sessions per day.  Copyright  VHI. All rights reserved.    Arm / Leg Extension: Alternate (All-Fours)    Start with just raising one leg at a time.  Do 10 reps, take a break when you need to for your wrists/hands.  Then go into this exercise.  Raise right arm and opposite leg. Do not arch neck. Repeat __10__ times per set. Do __1__ sets per session. Do __1-2__ sessions per day.  Keep shoulders over wrists and hips over knees.    http://orth.exer.us/110   Copyright  VHI. All rights reserved.

## 2017-05-18 ENCOUNTER — Encounter (HOSPITAL_COMMUNITY): Payer: Self-pay | Admitting: Emergency Medicine

## 2017-05-18 ENCOUNTER — Emergency Department (HOSPITAL_COMMUNITY)
Admission: EM | Admit: 2017-05-18 | Discharge: 2017-05-18 | Disposition: A | Discharge status: Home / Self Care | Payer: Medicaid Other | Attending: Emergency Medicine | Admitting: Emergency Medicine

## 2017-05-18 DIAGNOSIS — R1084 Generalized abdominal pain: Secondary | ICD-10-CM | POA: Diagnosis present

## 2017-05-18 DIAGNOSIS — R112 Nausea with vomiting, unspecified: Secondary | ICD-10-CM | POA: Diagnosis not present

## 2017-05-18 DIAGNOSIS — Z79899 Other long term (current) drug therapy: Secondary | ICD-10-CM | POA: Insufficient documentation

## 2017-05-18 DIAGNOSIS — R197 Diarrhea, unspecified: Secondary | ICD-10-CM | POA: Insufficient documentation

## 2017-05-18 DIAGNOSIS — E119 Type 2 diabetes mellitus without complications: Secondary | ICD-10-CM | POA: Insufficient documentation

## 2017-05-18 DIAGNOSIS — Z7984 Long term (current) use of oral hypoglycemic drugs: Secondary | ICD-10-CM | POA: Insufficient documentation

## 2017-05-18 DIAGNOSIS — Z794 Long term (current) use of insulin: Secondary | ICD-10-CM | POA: Diagnosis not present

## 2017-05-18 LAB — URINALYSIS, ROUTINE W REFLEX MICROSCOPIC
Bilirubin Urine: NEGATIVE
Glucose, UA: 150 mg/dL — AB
Ketones, ur: 80 mg/dL — AB
LEUKOCYTES UA: NEGATIVE
Nitrite: NEGATIVE
PROTEIN: 100 mg/dL — AB
SPECIFIC GRAVITY, URINE: 1.032 — AB (ref 1.005–1.030)
pH: 5 (ref 5.0–8.0)

## 2017-05-18 LAB — LIPASE, BLOOD: LIPASE: 22 U/L (ref 11–51)

## 2017-05-18 LAB — BLOOD GAS, VENOUS
Acid-Base Excess: 1 mmol/L (ref 0.0–2.0)
BICARBONATE: 21.9 mmol/L (ref 20.0–28.0)
O2 Saturation: 84.1 %
PATIENT TEMPERATURE: 98.6
PCO2 VEN: 25.5 mmHg — AB (ref 44.0–60.0)
PH VEN: 7.543 — AB (ref 7.250–7.430)
PO2 VEN: 43.8 mmHg (ref 32.0–45.0)

## 2017-05-18 LAB — COMPREHENSIVE METABOLIC PANEL
ALT: 16 U/L — AB (ref 17–63)
AST: 25 U/L (ref 15–41)
Albumin: 4.6 g/dL (ref 3.5–5.0)
Alkaline Phosphatase: 48 U/L (ref 38–126)
Anion gap: 15 (ref 5–15)
BUN: 27 mg/dL — AB (ref 6–20)
CHLORIDE: 97 mmol/L — AB (ref 101–111)
CO2: 20 mmol/L — ABNORMAL LOW (ref 22–32)
CREATININE: 1.16 mg/dL (ref 0.61–1.24)
Calcium: 9.2 mg/dL (ref 8.9–10.3)
GFR calc Af Amer: >60 mL/min (ref >60)
GLUCOSE: 230 mg/dL — AB (ref 65–99)
POTASSIUM: 3.7 mmol/L (ref 3.5–5.1)
Sodium: 132 mmol/L — ABNORMAL LOW (ref 135–145)
Total Bilirubin: 2.3 mg/dL — ABNORMAL HIGH (ref 0.3–1.2)
Total Protein: 7.7 g/dL (ref 6.5–8.1)

## 2017-05-18 LAB — CBC
HEMATOCRIT: 38.1 % — AB (ref 39.0–52.0)
Hemoglobin: 13.7 g/dL (ref 13.0–17.0)
MCH: 30 pg (ref 26.0–34.0)
MCHC: 36 g/dL (ref 30.0–36.0)
MCV: 83.4 fL (ref 78.0–100.0)
PLATELETS: 286 10*3/uL (ref 150–400)
RBC: 4.57 MIL/uL (ref 4.22–5.81)
RDW: 12.6 % (ref 11.5–15.5)
WBC: 10.9 10*3/uL — AB (ref 4.0–10.5)

## 2017-05-18 MED ORDER — SODIUM CHLORIDE 0.9 % IV BOLUS (SEPSIS)
1000.0000 mL | Freq: Once | INTRAVENOUS | Status: AC
  Administered 2017-05-18: 1000 mL via INTRAVENOUS

## 2017-05-18 MED ORDER — PROCHLORPERAZINE EDISYLATE 5 MG/ML IJ SOLN
10.0000 mg | Freq: Once | INTRAMUSCULAR | Status: AC
  Administered 2017-05-18: 10 mg via INTRAVENOUS
  Filled 2017-05-18: qty 2

## 2017-05-18 MED ORDER — PROMETHAZINE HCL 25 MG PO TABS: 25.0000 mg | ORAL_TABLET | Freq: Four times a day (QID) | ORAL | 0 refills | Status: DC | PRN

## 2017-05-18 MED ORDER — MORPHINE SULFATE (PF) 2 MG/ML IV SOLN
4.0000 mg | Freq: Once | INTRAVENOUS | Status: AC
  Administered 2017-05-18: 4 mg via INTRAVENOUS
  Filled 2017-05-18: qty 2

## 2017-05-18 MED ORDER — PROMETHAZINE HCL 25 MG PO TABS
25.0000 mg | ORAL_TABLET | Freq: Once | ORAL | Status: AC
  Administered 2017-05-18: 25 mg via ORAL
  Filled 2017-05-18: qty 1

## 2017-05-18 MED ORDER — GI COCKTAIL ~~LOC~~
30.0000 mL | Freq: Once | ORAL | Status: AC
  Administered 2017-05-18: 30 mL via ORAL
  Filled 2017-05-18: qty 30

## 2017-05-18 NOTE — ED Triage Notes (Signed)
Pt with epigastric pain, nausea and vomiting x 24 hours. Pt with hx of gastroparesis and patient has recurrent symptoms. Pt in no distress.

## 2017-05-18 NOTE — ED Notes (Signed)
Pt states he has a hx of gastroparesis. Pt states he is constantly nauseous and unable to hold anything down since Monday night. Pt states he was recently hospitalized in the last month for similar flare up.

## 2017-05-18 NOTE — Discharge Instructions (Signed)
Please call and schedule a follow-up with a primary care physician in the next several days. Please take your nausea medicine to help with her symptoms. Please speak to her PCP about over-the-counter GI cocktail medications. He may use over-the-counter Maalox as this might help. Please try and stay hydrated. If any symptoms change or worsen, please return to the nearest emergency department.

## 2017-05-18 NOTE — ED Provider Notes (Signed)
[facility]-EMERGENCY DEPT Provider Note   CSN: [id number] Arrival date & time: 05/18/17  1036     History   Chief Complaint Chief Complaint  Patient presents with  . Abdominal Pain  . Emesis  . Nausea    HPI Travis [name] is a 46 y.o. male.   The history is provided by the patient, the spouse, a relative and medical records.  Emesis   This is a new problem. The current episode started more than 2 days ago. The problem occurs continuously. The problem has not changed since onset.The emesis has an appearance of stomach contents. Associated symptoms include abdominal pain, chills and diarrhea. Pertinent negatives include no cough, no fever, no headaches, no myalgias and no URI.  Diarrhea   This is a new problem. The current episode started more than 2 days ago. The problem occurs continuously. The problem has been resolved. The stool consistency is described as watery. Associated symptoms include abdominal pain, vomiting and chills. Pertinent negatives include no headaches, no myalgias, no URI and no cough. He has tried nothing for the symptoms. The treatment provided no relief.    Past Medical History:  Diagnosis Date  . Acute osteomyelitis of metatarsal bone of left foot (HCC) 11/12/2015  . Closed fracture of right tibial plateau 11/11/2015  . Depression with anxiety   . Diabetes mellitus    INSULIN DEPENDENT Type II  . Diabetic peripheral neuropathy associated with type 2 diabetes mellitus (HCC) 08/30/2008   Qualifier: Diagnosis of  By: Daphine Deutscher FNP, Zena Amos    . Diabetic ulcer of left foot (HCC) 11/14/2015  . Gastroparesis   . GERD (gastroesophageal reflux disease)   . Osteomyelitis (HCC)   . Osteoporosis 11/12/2015  . Peripheral neuropathy   . Shortness of breath dyspnea    with exertion  . Vitamin D deficiency 11/12/2015    Patient Active Problem List   Diagnosis Date Noted  . Nausea and vomiting 04/08/2017  . 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 Short  Achilles tendon (acquired), left ankle 11/19/2016  . Subacute osteomyelitis of left foot (HCC) 10/20/2016  . Gait abnormality 10/06/2016  . DKA (diabetic ketoacidoses) (HCC) 05/21/2016  . Nausea & vomiting 05/21/2016  . Ketoacidosis 05/21/2016  . Malnutrition of moderate degree 05/21/2016  . Gastroparesis due to DM (HCC)   . Increased intraocular pressure 04/12/2016  . Post-op bleeding 11/29/2015  . Diabetic ulcer of left foot (HCC) 11/14/2015  . Acute osteomyelitis of metatarsal bone of left foot (HCC), Left 4th metatarsal 11/12/2015  . Vitamin D deficiency 11/12/2015  . Osteoporosis 11/12/2015  . Closed fracture of right tibial plateau 11/11/2015  . Visual disturbance 11/03/2015  . Neuropathy 11/03/2015  . Gastroesophageal reflux disease without esophagitis 11/03/2015  . 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 ALLERGIC RHINITIS 12/11/2008  . 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 CERVICALGIA 07/19/2008  . INSOMNIA 08/24/2007  . Dyslipidemia 08/10/2007  . Diabetes type 2, uncontrolled (HCC) 08/09/2007    Past Surgical History:  Procedure Laterality Date  . AMPUTATION Left 11/28/2015   Procedure: Left 4th Ray Amputation;  Surgeon: Nadara Mustard, MD;  Location: [facility] OR;  Service: Orthopedics;  Laterality: Left;  . AMPUTATION Left 10/29/2016   Procedure: LEFT 5TH RAY AMPUTATION;  Surgeon: Nadara Mustard, MD;  Location: [facility] OR;  Service: Orthopedics;  Laterality: Left;  . ANTERIOR VITRECTOMY Left 04/12/2016   Procedure: ANTERIOR CHAMBER WASH OUT WITH GAS FLUID EXCHANGE;  Surgeon: Carmela Rima, MD;  Location: [facility] OR;  Service: Ophthalmology;  Laterality: Left;  . CATARACT EXTRACTION Left   . EYE SURGERY     left eye surgery 04/2016  . KNEE SURGERY Left   . SHOULDER SURGERY Right   . TOE AMPUTATION Left 11/28/15   4th toe       Home Medications    Prior to Admission medications   Medication Sig Start Date End Date Taking?  Authorizing Provider  gabapentin (NEURONTIN) 600 MG tablet Take 1,800 mg by mouth at bedtime.   Yes [provider]  insulin detemir (LEVEMIR) 100 UNIT/ML injection Inject 0.2 mLs (20 Units total) into the skin at bedtime. 06/04/16  Yes Massie MaroonHollis, Lachina M, FNP  metFORMIN (GLUCOPHAGE) 1000 MG tablet Take 1,000 mg by mouth 2 (two) times daily.   Yes [provider]  metoCLOPramide (REGLAN) 10 MG tablet Take 1 tablet (10 mg total) by mouth 4 (four) times daily -  before meals and at bedtime. 04/12/17  Yes Noralee Stainhoi, [name] Chahn-Yang, DO  Multiple Vitamins-Minerals (MULTIVITAMIN WITH MINERALS) tablet Take 1 tablet by mouth daily.   Yes [provider]  ondansetron (ZOFRAN ODT) 4 MG disintegrating tablet Take 1 tablet (4 mg total) by mouth every 8 (eight) hours as needed for nausea or vomiting. 04/12/17  Yes Noralee Stainhoi, [name] Chahn-Yang, DO  pantoprazole (PROTONIX) 40 MG tablet Take 1 tablet (40 mg total) by mouth 2 (two) times daily before a meal. 04/12/17  Yes Jordan Hawkshoi, [name] Chahn-Yang, DO    Family History Family History  Problem Relation Age of Onset  . Cancer Paternal Grandmother   . Diabetes Mother   . COPD Mother   . Heart failure Mother   . Esophageal cancer Father   . Diabetes Sister   . Diabetes Maternal Uncle   . Diabetes Maternal Grandmother     Social History Social History  Substance Use Topics  . Smoking status: Never Smoker  . Smokeless tobacco: Never Used  . Alcohol use No     Allergies   No known allergies   Review of Systems Review of Systems  Constitutional: Positive for chills, diaphoresis and fatigue. Negative for appetite change and fever.  HENT: Negative for congestion.   Respiratory: Negative for cough, chest tightness, shortness of breath, wheezing and stridor.   Cardiovascular: Negative for chest pain, palpitations and leg swelling.  Gastrointestinal: Positive for abdominal pain, diarrhea, nausea and vomiting. Negative for abdominal  distention, blood in stool and constipation.  Genitourinary: Negative for dysuria, enuresis, flank pain and frequency.  Musculoskeletal: Negative for back pain, myalgias, neck pain and neck stiffness.  Skin: Negative for rash and wound.  Neurological: Negative for tremors, weakness, light-headedness, numbness and headaches.  Psychiatric/Behavioral: Negative for agitation.  All other systems reviewed and are negative.    Physical Exam Updated Vital Signs BP (!) 152/86 (BP Location: Left Arm)   Pulse 79   Temp 98.2 F (36.8 C) (Oral)   Resp 16   SpO2 98%   Physical Exam  Constitutional: He is oriented to person, place, and time. He appears well-developed and well-nourished. No distress.  HENT:  Head: Normocephalic and atraumatic.  Mouth/Throat: Oropharynx is clear and moist. No oropharyngeal exudate.  Eyes: Conjunctivae and EOM are normal. Pupils are equal, round, and reactive to light.  Neck: Neck supple.  Cardiovascular: Normal rate, regular rhythm, normal heart sounds and intact distal pulses.   No murmur heard. Pulmonary/Chest: Effort normal and breath sounds normal. No stridor. No respiratory distress. He has no wheezes. He  has no rales. He exhibits no tenderness.  Abdominal: Soft. He exhibits no distension. There is no tenderness.  Musculoskeletal: He exhibits no edema or tenderness.  Neurological: He is alert and oriented to person, place, and time. No sensory deficit. He exhibits normal muscle tone.  Skin: Skin is warm and dry. Capillary refill takes less than 2 seconds. He is not diaphoretic. No erythema. No pallor.  Psychiatric: He has a normal mood and affect.  Nursing note and vitals reviewed.    ED Treatments / Results  Labs (all labs ordered are listed, but only abnormal results are displayed) Labs Reviewed  COMPREHENSIVE METABOLIC PANEL - Abnormal; Notable for the following:       Result Value   Sodium 132 (*)    Chloride 97 (*)    CO2 20 (*)    Glucose,  Bld 230 (*)    BUN 27 (*)    ALT 16 (*)    Total Bilirubin 2.3 (*)    All other components within normal limits  CBC - Abnormal; Notable for the following:    WBC 10.9 (*)    HCT 38.1 (*)    All other components within normal limits  URINALYSIS, ROUTINE W REFLEX MICROSCOPIC - Abnormal; Notable for the following:    Color, Urine AMBER (*)    APPearance CLOUDY (*)    Specific Gravity, Urine 1.032 (*)    Glucose, UA 150 (*)    Hgb urine dipstick SMALL (*)    Ketones, ur 80 (*)    Protein, ur 100 (*)    Bacteria, UA RARE (*)    Squamous Epithelial / LPF 0-5 (*)    All other components within normal limits  BLOOD GAS, VENOUS - Abnormal; Notable for the following:    pH, Ven 7.543 (*)    pCO2, Ven 25.5 (*)    All other components within normal limits  LIPASE, BLOOD  I-STAT VENOUS BLOOD GAS, ED    EKG  EKG Interpretation None       Radiology No results found.  Procedures Procedures (including critical care time)  Medications Ordered in ED Medications  sodium chloride 0.9 % bolus 1,000 mL (0 mLs Intravenous Stopped 05/18/17 1646)  sodium chloride 0.9 % bolus 1,000 mL (0 mLs Intravenous Stopped 05/18/17 1647)  prochlorperazine (COMPAZINE) injection 10 mg (10 mg Intravenous Given 05/18/17 1315)  morphine 2 MG/ML injection 4 mg (4 mg Intravenous Given 05/18/17 1509)  gi cocktail (Maalox,Lidocaine,Donnatal) (30 mLs Oral Given 05/18/17 1508)  promethazine (PHENERGAN) tablet 25 mg (25 mg Oral Given 05/18/17 1645)     Initial Impression / Assessment and Plan / ED Course  I have reviewed the triage vital signs and the nursing notes.  Pertinent labs & imaging results that were available during my care of the patient were reviewed by me and considered in my medical decision making (see chart for details).     Travis [name] is a 46 y.o. male with a past medical history significant for diabetes and prior DKA who presents with nausea, vomiting, abdominal pain, and diarrhea. Patient  reports that he had diarrhea over the weekend that has improved. He says that he is now having nausea and vomiting and abdominal discomfort. He describes it as a burning abdominal pain. He says it feels like his gastroparesis flaring up. He says this last happened several weeks ago. He denies any chest pain or shortness of breath. He denies any constipation. He says that his urination has been normal without  dysuria or change in color. He denies fevers, chills, or any trauma. He describes abdominal pain as a 2 out of 10 in severity and located in the left abdomen.  Patient reports diarrhea over the weekend and feels dehydrated. He reports that they have a small child that may have been a sick contact.  History and exam are seen above. On exam, patient has no significant abdominal tenderness. No CVA tenderness. Lungs clear. Chest nontender. No focal neurologic deficits.  Based on patient's description of symptoms and exam, initial concern is for gastroenteritis with his nausea/vomiting/diarrhea however, gastroparesis is also considered given his history. DKA also considered.  Patient given nausea medicine and fluids. Pain medicine also initially given. Patient reports a G.I. cocktail "fixed everything". He says that he feels much better after the cocktail. Patient also felt better after fluids.  No evidence of DKA with no acidosis. Patient does have ketones and urine. No evidence of UTI. Slight leukocytosis on to prior. Similar electrolyte abnormalities to prior. Lipase nonelevated.   Given reassuring workup with no evidence of DKA and no significant abnormalities on labs, patient felt stable for discharge. Patient given oral Phenergan with improvement in nausea. Also given improvement in symptoms, do not feel patient needs CT scan at this time.  Patient will be in. As well as instructions to follow up with his PCP. Suspect viral gastroenteritis versus gastroparesis. Patient understood plans for  continued outpatient oral rehydration as well as return precautions were new or worsened symptoms. Patient and family had no other questions or concerns and patient was discharged in good condition with improvement in presenting symptoms.   Final Clinical Impressions(s) / ED Diagnoses   Final diagnoses:  Nausea vomiting and diarrhea  Generalized abdominal pain    New Prescriptions Discharge Medication List as of 05/18/2017  4:57 PM    START taking these medications   Details  promethazine (PHENERGAN) 25 MG tablet Take 1 tablet (25 mg total) by mouth every 6 (six) hours as needed for nausea or vomiting., Starting Wed 05/18/2017, Print        Clinical Impression: 1. Nausea vomiting and diarrhea   2. Generalized abdominal pain     Disposition: Discharge  Condition: Good  I have discussed the results, Dx and Tx plan with the pt(& family if present). He/she/they expressed understanding and agree(s) with the plan. Discharge instructions discussed at great length. Strict return precautions discussed and pt &/or family have verbalized understanding of the instructions. No further questions at time of discharge.    Discharge Medication List as of 05/18/2017  4:57 PM    START taking these medications   Details  promethazine (PHENERGAN) 25 MG tablet Take 1 tablet (25 mg total) by mouth every 6 (six) hours as needed for nausea or vomiting., Starting Wed 05/18/2017, Print        Follow Up: [facility] [address] [id number] mc [place] [address] [phone]  If symptoms worsen  [facility] [address] [place] [address] [phone] Schedule an appointment as soon as possible for a visit       Caniyah Murley, Canary Brim, MD 05/18/17 2027

## 2017-05-23 ENCOUNTER — Ambulatory Visit: Payer: Medicaid Other | Admitting: Rehabilitation

## 2017-05-28 ENCOUNTER — Encounter (HOSPITAL_COMMUNITY): Payer: Self-pay | Admitting: Emergency Medicine

## 2017-05-28 ENCOUNTER — Emergency Department (HOSPITAL_COMMUNITY)
Admission: EM | Admit: 2017-05-28 | Discharge: 2017-05-28 | Disposition: A | Discharge status: Home / Self Care | Payer: Medicaid Other | Attending: Emergency Medicine | Admitting: Emergency Medicine

## 2017-05-28 DIAGNOSIS — R109 Unspecified abdominal pain: Secondary | ICD-10-CM | POA: Diagnosis present

## 2017-05-28 DIAGNOSIS — Z794 Long term (current) use of insulin: Secondary | ICD-10-CM | POA: Diagnosis not present

## 2017-05-28 DIAGNOSIS — E119 Type 2 diabetes mellitus without complications: Secondary | ICD-10-CM | POA: Diagnosis not present

## 2017-05-28 DIAGNOSIS — Z79899 Other long term (current) drug therapy: Secondary | ICD-10-CM | POA: Insufficient documentation

## 2017-05-28 DIAGNOSIS — K3184 Gastroparesis: Secondary | ICD-10-CM | POA: Diagnosis not present

## 2017-05-28 LAB — CBC
HCT: 40.6 % (ref 39.0–52.0)
Hemoglobin: 14.7 g/dL (ref 13.0–17.0)
MCH: 30.2 pg (ref 26.0–34.0)
MCHC: 36.2 g/dL — ABNORMAL HIGH (ref 30.0–36.0)
MCV: 83.5 fL (ref 78.0–100.0)
PLATELETS: 379 10*3/uL (ref 150–400)
RBC: 4.86 MIL/uL (ref 4.22–5.81)
RDW: 12.7 % (ref 11.5–15.5)
WBC: 12.2 10*3/uL — AB (ref 4.0–10.5)

## 2017-05-28 LAB — COMPREHENSIVE METABOLIC PANEL
ALT: 24 U/L (ref 17–63)
AST: 25 U/L (ref 15–41)
Albumin: 4.9 g/dL (ref 3.5–5.0)
Alkaline Phosphatase: 48 U/L (ref 38–126)
Anion gap: 17 — ABNORMAL HIGH (ref 5–15)
BILIRUBIN TOTAL: 2.1 mg/dL — AB (ref 0.3–1.2)
BUN: 15 mg/dL (ref 6–20)
CHLORIDE: 99 mmol/L — AB (ref 101–111)
CO2: 18 mmol/L — ABNORMAL LOW (ref 22–32)
CREATININE: 0.84 mg/dL (ref 0.61–1.24)
Calcium: 9.7 mg/dL (ref 8.9–10.3)
Glucose, Bld: 131 mg/dL — ABNORMAL HIGH (ref 65–99)
POTASSIUM: 3.2 mmol/L — AB (ref 3.5–5.1)
Sodium: 134 mmol/L — ABNORMAL LOW (ref 135–145)
TOTAL PROTEIN: 8.1 g/dL (ref 6.5–8.1)

## 2017-05-28 LAB — URINALYSIS, ROUTINE W REFLEX MICROSCOPIC
GLUCOSE, UA: NEGATIVE mg/dL
LEUKOCYTES UA: NEGATIVE
NITRITE: NEGATIVE
Specific Gravity, Urine: >1.03 — ABNORMAL HIGH (ref 1.005–1.030)
pH: 6 (ref 5.0–8.0)

## 2017-05-28 LAB — URINALYSIS, MICROSCOPIC (REFLEX): BACTERIA UA: NONE SEEN

## 2017-05-28 LAB — LIPASE, BLOOD: LIPASE: 22 U/L (ref 11–51)

## 2017-05-28 MED ORDER — HALOPERIDOL LACTATE 5 MG/ML IJ SOLN
3.0000 mg | Freq: Once | INTRAMUSCULAR | Status: AC
  Administered 2017-05-28: 3 mg via INTRAVENOUS
  Filled 2017-05-28: qty 1

## 2017-05-28 MED ORDER — PROCHLORPERAZINE EDISYLATE 5 MG/ML IJ SOLN
5.0000 mg | Freq: Once | INTRAMUSCULAR | Status: AC
  Administered 2017-05-28: 5 mg via INTRAVENOUS
  Filled 2017-05-28: qty 2

## 2017-05-28 MED ORDER — SODIUM CHLORIDE 0.9 % IV BOLUS (SEPSIS)
1000.0000 mL | Freq: Once | INTRAVENOUS | Status: AC
  Administered 2017-05-28: 1000 mL via INTRAVENOUS

## 2017-05-28 MED ORDER — GI COCKTAIL ~~LOC~~
30.0000 mL | Freq: Once | ORAL | Status: AC
  Administered 2017-05-28: 30 mL via ORAL
  Filled 2017-05-28: qty 30

## 2017-05-28 MED ORDER — ONDANSETRON HCL 4 MG/2ML IJ SOLN: 4.0000 mg | Freq: Once | INTRAMUSCULAR | Status: DC

## 2017-05-28 NOTE — Discharge Instructions (Signed)
Please read attached information. If you experience any new or worsening signs or symptoms please return to the emergency room for evaluation. Please follow-up with your primary care provider or specialist as discussed.  °

## 2017-05-28 NOTE — ED Triage Notes (Addendum)
Per EMS pt complaint of worsening abd pain with associated nausea/vomiting; denies diarrhea. Seen here here 6/6 for same. Given 4 mg zofran en route with EMS.

## 2017-05-28 NOTE — ED Provider Notes (Signed)
[facility]-EMERGENCY DEPT Provider Note   CSN: [id number] Arrival date & time: 05/28/17  1405   History   Chief Complaint Chief Complaint  Patient presents with  . Abdominal Pain    HPI DELVIS KAU is a 46 y.o. male.  HPI    46 year old male presents today with complaints of gastroparesis flare.  Patient notes that symptoms started approximately 10 days ago.  He notes this is typical of his gastroparesis flare.  He was seen in the emergency room with minor improvement in symptoms.  He notes that he did not return to baseline at home, but had worsening symptoms yesterday.  He describes this as burning in his abdomen with radiation up into his throat.  He notes he is also having some acid reflux.  Patient notes nausea vomiting yesterday, dry heaving today with no vomiting.  He denies any diarrhea.  Denies any fever, denies any concerning signs or symptoms other than typical gastroparesis flare.  Patient denies any alcohol use, denies any preceding symptoms.  Patient notes her medications not helping symptoms.    Past Medical History:  Diagnosis Date  . Acute osteomyelitis of metatarsal bone of left foot (HCC) 11/12/2015  . Closed fracture of right tibial plateau 11/11/2015  . Depression with anxiety   . Diabetes mellitus    INSULIN DEPENDENT Type II  . Diabetic peripheral neuropathy associated with type 2 diabetes mellitus (HCC) 08/30/2008   Qualifier: Diagnosis of  By: Daphine Deutscher FNP, Zena Amos    . Diabetic ulcer of left foot (HCC) 11/14/2015  . Gastroparesis   . GERD (gastroesophageal reflux disease)   . Osteomyelitis (HCC)   . Osteoporosis 11/12/2015  . Peripheral neuropathy   . Shortness of breath dyspnea    with exertion  . Vitamin D deficiency 11/12/2015    Patient Active Problem List   Diagnosis Date Noted  . Nausea and vomiting 04/08/2017  . 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 Short Achilles tendon (acquired), left ankle 11/19/2016  . Subacute osteomyelitis  of left foot (HCC) 10/20/2016  . Gait abnormality 10/06/2016  . DKA (diabetic ketoacidoses) (HCC) 05/21/2016  . Nausea & vomiting 05/21/2016  . Ketoacidosis 05/21/2016  . Malnutrition of moderate degree 05/21/2016  . Gastroparesis due to DM (HCC)   . Increased intraocular pressure 04/12/2016  . Post-op bleeding 11/29/2015  . Diabetic ulcer of left foot (HCC) 11/14/2015  . Acute osteomyelitis of metatarsal bone of left foot (HCC), Left 4th metatarsal 11/12/2015  . Vitamin D deficiency 11/12/2015  . Osteoporosis 11/12/2015  . Closed fracture of right tibial plateau 11/11/2015  . Visual disturbance 11/03/2015  . Neuropathy 11/03/2015  . Gastroesophageal reflux disease without esophagitis 11/03/2015  . 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 ALLERGIC RHINITIS 12/11/2008  . 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 CERVICALGIA 07/19/2008  . INSOMNIA 08/24/2007  . Dyslipidemia 08/10/2007  . Diabetes type 2, uncontrolled (HCC) 08/09/2007    Past Surgical History:  Procedure Laterality Date  . AMPUTATION Left 11/28/2015   Procedure: Left 4th Ray Amputation;  Surgeon: Nadara Mustard, MD;  Location: [facility] OR;  Service: Orthopedics;  Laterality: Left;  . AMPUTATION Left 10/29/2016   Procedure: LEFT 5TH RAY AMPUTATION;  Surgeon: Nadara Mustard, MD;  Location: [facility] OR;  Service: Orthopedics;  Laterality: Left;  . ANTERIOR VITRECTOMY Left 04/12/2016   Procedure: ANTERIOR CHAMBER WASH OUT WITH GAS FLUID EXCHANGE;  Surgeon: Carmela Rima, MD;  Location: [facility] OR;  Service: Ophthalmology;  Laterality:  Left;  . CATARACT EXTRACTION Left   . EYE SURGERY     left eye surgery 04/2016  . KNEE SURGERY Left   . SHOULDER SURGERY Right   . TOE AMPUTATION Left 11/28/15   4th toe       Home Medications    Prior to Admission medications   Medication Sig Start Date End Date Taking? Authorizing Provider  gabapentin (NEURONTIN) 600 MG tablet Take 1,800 mg by  mouth at bedtime.   Yes [provider]  insulin detemir (LEVEMIR) 100 UNIT/ML injection Inject 0.2 mLs (20 Units total) into the skin at bedtime. 06/04/16  Yes Massie MaroonHollis, Lachina M, FNP  metFORMIN (GLUCOPHAGE) 1000 MG tablet Take 1,000 mg by mouth 2 (two) times daily.   Yes [provider]  metoCLOPramide (REGLAN) 10 MG tablet Take 1 tablet (10 mg total) by mouth 4 (four) times daily -  before meals and at bedtime. 04/12/17  Yes Noralee Stainhoi, [name] Chahn-Yang, DO  Multiple Vitamins-Minerals (MULTIVITAMIN WITH MINERALS) tablet Take 1 tablet by mouth daily.   Yes [provider]  ondansetron (ZOFRAN ODT) 4 MG disintegrating tablet Take 1 tablet (4 mg total) by mouth every 8 (eight) hours as needed for nausea or vomiting. 04/12/17  Yes Noralee Stainhoi, [name] Chahn-Yang, DO  pantoprazole (PROTONIX) 40 MG tablet Take 1 tablet (40 mg total) by mouth 2 (two) times daily before a meal. 04/12/17  Yes Noralee Stainhoi, [name] Chahn-Yang, DO  promethazine (PHENERGAN) 25 MG tablet Take 1 tablet (25 mg total) by mouth every 6 (six) hours as needed for nausea or vomiting. 05/18/17  Yes Tegeler, Canary Brimhristopher J, MD    Family History Family History  Problem Relation Age of Onset  . Cancer Paternal Grandmother   . Diabetes Mother   . COPD Mother   . Heart failure Mother   . Esophageal cancer Father   . Diabetes Sister   . Diabetes Maternal Uncle   . Diabetes Maternal Grandmother     Social History Social History  Substance Use Topics  . Smoking status: Never Smoker  . Smokeless tobacco: Never Used  . Alcohol use No     Allergies   No known allergies   Review of Systems Review of Systems  All other systems reviewed and are negative.   Physical Exam Updated Vital Signs BP (!) 143/86   Pulse 79   Temp 98.1 F (36.7 C) (Oral)   Resp 13   Ht 6\' 2"  (1.88 m)   Wt 99.8 kg (220 lb)   SpO2 100%   BMI 28.25 kg/m   Physical Exam  Constitutional: He is oriented to person, place, and time. He appears  well-developed and well-nourished.  Dry heaving   HENT:  Head: Normocephalic and atraumatic.  Eyes: Conjunctivae are normal. Pupils are equal, round, and reactive to light. Right eye exhibits no discharge. Left eye exhibits no discharge. No scleral icterus.  Neck: Normal range of motion. No JVD present. No tracheal deviation present.  Pulmonary/Chest: Effort normal. No stridor.  Abdominal: Soft. He exhibits no distension and no mass. There is no tenderness. There is no rebound and no guarding. No hernia.  Neurological: He is alert and oriented to person, place, and time. Coordination normal.  Skin: Skin is warm.  Psychiatric: He has a normal mood and affect. His behavior is normal. Judgment and thought content normal.  Nursing note and vitals reviewed.    ED Treatments / Results  Labs (all labs ordered are listed, but only abnormal results are displayed) Labs  Reviewed  COMPREHENSIVE METABOLIC PANEL - Abnormal; Notable for the following:       Result Value   Sodium 134 (*)    Potassium 3.2 (*)    Chloride 99 (*)    CO2 18 (*)    Glucose, Bld 131 (*)    Total Bilirubin 2.1 (*)    Anion gap 17 (*)    All other components within normal limits  CBC - Abnormal; Notable for the following:    WBC 12.2 (*)    MCHC 36.2 (*)    All other components within normal limits  URINALYSIS, ROUTINE W REFLEX MICROSCOPIC - Abnormal; Notable for the following:    APPearance HAZY (*)    Specific Gravity, Urine >1.030 (*)    Hgb urine dipstick TRACE (*)    Bilirubin Urine SMALL (*)    Ketones, ur >80 (*)    Protein, ur >300 (*)    All other components within normal limits  URINALYSIS, MICROSCOPIC (REFLEX) - Abnormal; Notable for the following:    Squamous Epithelial / LPF 0-5 (*)    All other components within normal limits  LIPASE, BLOOD    EKG  EKG Interpretation None       Radiology No results found.  Procedures Procedures (including critical care time)  Medications Ordered in  ED Medications  sodium chloride 0.9 % bolus 1,000 mL (0 mLs Intravenous Stopped 05/28/17 2051)  haloperidol lactate (HALDOL) injection 3 mg (3 mg Intravenous Given 05/28/17 1932)  prochlorperazine (COMPAZINE) injection 5 mg (5 mg Intravenous Given 05/28/17 1931)  gi cocktail (Maalox,Lidocaine,Donnatal) (30 mLs Oral Given 05/28/17 2054)     Initial Impression / Assessment and Plan / ED Course  I have reviewed the triage vital signs and the nursing notes.  Pertinent labs & imaging results that were available during my care of the patient were reviewed by me and considered in my medical decision making (see chart for details).      Final Clinical Impressions(s) / ED Diagnoses   Final diagnoses:  Gastroparesis    Labs: Lipase, CMP, CBC  Imaging:  Consults:  Therapeutics: Compazine, Haldol, GI cocktail, normal saline  Discharge Meds:   Assessment/Plan: 45-year-male with likely gastroparesis.  Patient notes is identical to previous.  He was given Compazine, Haldol, GI cocktail which significantly improved his symptoms.  Patient was tolerating p.o. and requesting discharge home.  Patient had very minor hypokalemia likely secondary to poor p.o. intake.  He will be instructed to gradually increase diet at home, follow-up as an outpatient for ongoing management of his recurring gastroparesis.  He was given strict return precautions, both patient and his mother verbalized understanding and agreement to today's plan had no further questions or concerns.  Patient does have antinausea medication at home.      New Prescriptions Discharge Medication List as of 05/28/2017 10:04 PM       Eyvonne Mechanic, PA-C 05/28/17 2244    Lavera Guise, MD 05/29/17 [phone]

## 2017-05-28 NOTE — ED Notes (Addendum)
Pt assisted to lobby with wheelchair; pt instructed to let staff know if leaving so that IV can be removed.

## 2017-06-27 ENCOUNTER — Encounter: Payer: Self-pay | Admitting: Rehabilitation

## 2017-06-27 NOTE — Therapy (Signed)
[facility] [address] [place], [place], [address] Phone: [phone]   Fax:  [phone]  Patient Details  Name: Travis [name] MRN: [id number] Date of Birth: 1971-10-03 Referring Provider:  No ref. provider found  Encounter Date: 06/27/2017   PHYSICAL THERAPY DISCHARGE SUMMARY  Visits from Start of Care: 2  Current functional level related to goals / functional outcomes:     PT Long Term Goals - 03/14/17 0951      PT LONG TERM GOAL #1   Title Pt will be indepenent with final HEP in order to indicate improved functional strength and decreased fall risk.  (Target Date:  Following 3rd visit after eval)   Baseline dependent    Status New     PT LONG TERM GOAL #2   Title Pt will improve BERG balance score by 4 points in order to indicate decreased fall risk.     Baseline unable to assess due to time   Status New     PT LONG TERM GOAL #3   Title Pt will ambulate up to 300' w/ LRAD over indoor surfaces (including making turns) at mod I level in order to indicate safe home and work negotiation.     Baseline 115' at S level without AD    Status New     PT LONG TERM GOAL #4   Title Will assess ability to use rollator and/or push w/c in order to attend leisure activities with family at mod I level.     Baseline Does not go to events/functions with family that involve longer distances due to fatigue and balance.    Status New     PT LONG TERM GOAL #5   Title Pt will ambulate up to 500' w/ LRAD at mod I level over unlevel paved surfaces (including ramp/curb) in order to indicate safe return to community and work.     Baseline Did not assess outdoor gait due to time, see above for indoor gait.    Status New        Remaining deficits: Unsure as pt did not return for follow up visits   Education / Equipment: HEP  Plan: Patient agrees to discharge.  Patient goals were not met. Patient is being discharged due to  not returning since the last visit.  ?????         Cameron Sprang, PT, MPT [facility] [address] [place], [place], [address] Phone: [phone]   Fax:  [phone] 06/27/17, 12:06 PM

## 2017-07-04 ENCOUNTER — Other Ambulatory Visit (HOSPITAL_COMMUNITY): Payer: Self-pay | Admitting: Physician Assistant

## 2017-07-04 ENCOUNTER — Other Ambulatory Visit: Payer: Self-pay | Admitting: Physician Assistant

## 2017-07-04 DIAGNOSIS — R634 Abnormal weight loss: Secondary | ICD-10-CM

## 2017-07-04 DIAGNOSIS — R1115 Cyclical vomiting syndrome unrelated to migraine: Secondary | ICD-10-CM

## 2017-07-04 DIAGNOSIS — G43A1 Cyclical vomiting, intractable: Secondary | ICD-10-CM

## 2017-07-07 ENCOUNTER — Ambulatory Visit
Admission: RE | Admit: 2017-07-07 | Discharge: 2017-07-07 | Disposition: A | Discharge status: Home / Self Care | Payer: Medicaid Other | Source: Ambulatory Visit | Attending: Physician Assistant | Admitting: Physician Assistant

## 2017-07-07 DIAGNOSIS — R1115 Cyclical vomiting syndrome unrelated to migraine: Secondary | ICD-10-CM

## 2017-07-07 DIAGNOSIS — R634 Abnormal weight loss: Secondary | ICD-10-CM

## 2017-07-13 ENCOUNTER — Ambulatory Visit (HOSPITAL_COMMUNITY)
Admission: RE | Admit: 2017-07-13 | Discharge: 2017-07-13 | Disposition: A | Discharge status: Home / Self Care | Payer: Medicaid Other | Source: Ambulatory Visit | Attending: Physician Assistant | Admitting: Physician Assistant

## 2017-07-13 DIAGNOSIS — G43A1 Cyclical vomiting, intractable: Secondary | ICD-10-CM | POA: Diagnosis not present

## 2017-07-13 MED ORDER — TECHNETIUM TC 99M MEBROFENIN IV KIT
5.0000 | PACK | Freq: Once | INTRAVENOUS | Status: AC | PRN
  Administered 2017-07-13: 5 via INTRAVENOUS

## 2017-08-22 ENCOUNTER — Telehealth: Payer: Self-pay | Admitting: Cardiovascular Disease

## 2017-08-22 NOTE — Telephone Encounter (Signed)
[facility] on 09/14/17 with Dr Duke Salviaandolph.  Records put with Dr Leonides Sakeandolph's schedule for 09/14/17. lp

## 2017-09-13 NOTE — Progress Notes (Signed)
Cardiology Office Note   Date:  09/14/2017   ID:  Travis [name], DOB 1971/02/01, MRN [id number]  PCP:  Salli Real, MD  Cardiologist:   Chilton Si, MD  General Surgeon: Dr. Gaynelle Adu   Chief Complaint  Patient presents with  . New Patient (Initial Visit)  . Dizziness    when standing up.  . Edema    feet, legs, ankles       History of Present Illness: Travis [name] is a 46 y.o. male with chronic systolic and diastolic heart failure, diabetes, hyperlipidemia, who presents for pre-surgical risk assessment.  Travis [name] was evaluated by Dr. Gaynelle Adu for gallbladder stones and biliary dyskinesia.  At that appointment he reported exertional dyspnea.  It was noted that Travis [name] had a Lexiscan Myoview 07/29/16 that revealed LVEF 26% with infarct in the mid anteroseptal, inferoseptal and apical regions.  There was no reported ischemia.  Hypokinesis was noted in the apical and mild-distal anterior and inferior walls.  Dr. Andrey Campanile recommended laparoscopic cholecystectomy but was referred for cardiac clearance. Since his stress test Travis [name] has undergone general anesthesia for a toe amputation.  He developed osteomyelitis of the L 5th toe 10/2016.  Travis [name] was diagnosed with diabetes in around 2008.  He did not have insurance and his diabetes was not under control for many years. In the last 6 or 8 months he has been working hard and his hemoglobin A1c has been reduced from 11% to 5%. He's been feeling very fatigued. His legs get very tired after walking short distances.  He gets very short of breath with exertion but denies any chest pain.  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 He had to lie down. Since that time he has symptoms with exertion. He has never experienced chest pain or pressure but does get short of breath. He is working to get disability. He currently works Tour manager and working 10 hours per week exhausts him. His leg burn when walking for long distances.  He sometimes has lower extremity edema that goes up to his knees. It is been well-controlled lately. It typically improves by the next day with elevation of his legs. He denies orthopnea or PND.   Past Medical History:  Diagnosis Date  . Acute on chronic systolic and diastolic heart failure, NYHA class 3 (HCC) 09/14/2017  . Acute osteomyelitis of metatarsal bone of left foot (HCC) 11/12/2015  . CAD in native artery 09/14/2017  . Closed fracture of right tibial plateau 11/11/2015  . Depression with anxiety   . Diabetes mellitus    INSULIN DEPENDENT Type II  . Diabetic peripheral neuropathy associated with type 2 diabetes mellitus (HCC) 08/30/2008   Qualifier: Diagnosis of  By: Daphine Deutscher FNP, Zena Amos    . Diabetic ulcer of left foot (HCC) 11/14/2015  . Gastroparesis   . GERD (gastroesophageal reflux disease)   . Osteomyelitis (HCC)   . Osteoporosis 11/12/2015  . Peripheral neuropathy   . Shortness of breath dyspnea    with exertion  . Vitamin D deficiency 11/12/2015    Past Surgical History:  Procedure Laterality Date  . AMPUTATION Left 11/28/2015   Procedure: Left 4th Ray Amputation;  Surgeon: Nadara Mustard, MD;  Location: [facility] OR;  Service: Orthopedics;  Laterality: Left;  . AMPUTATION Left 10/29/2016   Procedure: LEFT 5TH RAY AMPUTATION;  Surgeon: Randa Evens  Lajoyce Corners, MD;  Location: [facility] OR;  Service: Orthopedics;  Laterality: Left;  . ANTERIOR VITRECTOMY Left 04/12/2016   Procedure: ANTERIOR CHAMBER WASH OUT WITH GAS FLUID EXCHANGE;  Surgeon: Carmela Rima, MD;  Location: [facility] OR;  Service: Ophthalmology;  Laterality: Left;  . CATARACT EXTRACTION Left   . EYE SURGERY     left eye surgery 04/2016  . KNEE SURGERY Left   . SHOULDER SURGERY Right   . TOE AMPUTATION Left 11/28/15   4th toe     Current Outpatient Prescriptions  Medication Sig Dispense Refill  . aspirin EC 81 MG tablet Take 81 mg by  mouth daily.    Marland Kitchen gabapentin (NEURONTIN) 600 MG tablet Take 1,800 mg by mouth at bedtime.    . insulin detemir (LEVEMIR) 100 UNIT/ML injection Inject 0.2 mLs (20 Units total) into the skin at bedtime. 30 mL 3  . metFORMIN (GLUCOPHAGE) 1000 MG tablet Take 1,000 mg by mouth 2 (two) times daily.    . metoCLOPramide (REGLAN) 10 MG tablet Take 1 tablet (10 mg total) by mouth 4 (four) times daily -  before meals and at bedtime. 90 tablet 1  . Multiple Vitamins-Minerals (MULTIVITAMIN WITH MINERALS) tablet Take 1 tablet by mouth daily.    . ondansetron (ZOFRAN ODT) 4 MG disintegrating tablet Take 1 tablet (4 mg total) by mouth every 8 (eight) hours as needed for nausea or vomiting. 20 tablet 0  . pantoprazole (PROTONIX) 40 MG tablet Take 1 tablet (40 mg total) by mouth 2 (two) times daily before a meal. 60 tablet 0  . promethazine (PHENERGAN) 25 MG tablet Take 1 tablet (25 mg total) by mouth every 6 (six) hours as needed for nausea or vomiting. 18 tablet 0  . losartan (COZAAR) 25 MG tablet 1/2 tablet by mouth daily 15 tablet 5  . metoprolol succinate (TOPROL XL) 25 MG 24 hr tablet Take 1 tablet (25 mg total) by mouth daily. 30 tablet 5  . rosuvastatin (CRESTOR) 40 MG tablet Take 1 tablet (40 mg total) by mouth daily. 30 tablet 5   No current facility-administered medications for this visit.     Allergies:   No known allergies    Social History:  The patient  reports that he has never smoked. He has never used smokeless tobacco. He reports that he does not drink alcohol or use drugs.   Family History:  The patient's family history includes COPD in his mother; Cancer in his paternal grandmother; Diabetes in his maternal grandfather, maternal grandmother, maternal uncle, and sister; Esophageal cancer in his father; Fibromyalgia in his sister; GER disease in his sister; Heart failure in his mother; Hemachromatosis in his paternal grandfather; Liver disease in his paternal grandfather.    ROS:  Please see  the history of present illness.   Otherwise, review of systems are positive for none.   All other systems are reviewed and negative.    PHYSICAL EXAM: VS:  BP 132/79   Pulse 77   Ht  (1.88 m)   Wt 100.2 kg (220 lb 12.8 oz)   BMI 28.35 kg/m  , BMI Body mass index is 28.35 kg/m. GENERAL:  Chronically ill-appearing HEENT:  Pupils equal round and reactive, fundi not visualized, oral mucosa unremarkable NECK:  No jugular venous distention, waveform within normal limits, carotid upstroke brisk and symmetric, no bruits, no thyromegaly LUNGS:  Clear to auscultation bilaterally HEART:  RRR.  PMI not displaced or sustained,S1 and S2 within normal limits, no S3, no S4, no  clicks, no rubs, no murmurs ABD:  Flat, positive bowel sounds normal in frequency in pitch, no bruits, no rebound, no guarding, no midline pulsatile mass, no hepatomegaly, no splenomegaly EXT:  2 plus pulses throughout, no edema, no cyanosis no clubbing.  Thenar/hypothenar wasting SKIN:  No rashes no nodules NEURO:  Cranial nerves II through XII grossly intact, motor grossly intact throughout PSYCH:  Cognitively intact, oriented to person place and time   EKG:  EKG is ordered today. The ekg ordered today demonstrates sinus rhythm. Rate 77 bpm. Prior anterior infarct. Inferior ST elevations <69mm.  cannot rule out prior inferior infarct. Unchanged from 05/21/16.  Lexiscan Myoview 07/29/16:  Defect 1: There is a medium defect of severe severity present in the mid anteroseptal, mid inferoseptal and apex location.  Findings consistent with prior myocardial infarction. There is no ischemia identified.  This is an intermediate risk study based upon severely reduced ejection fraction consistent with prior infarction.  The left ventricular ejection fraction is severely decreased (<30%).  Nuclear stress EF: 26%.  Recent Labs: 10/06/2016: TSH 2.730 04/11/2017: Magnesium 1.8 05/28/2017: ALT 24; BUN 15; Creatinine, Ser 0.84;  Hemoglobin 14.7; Platelets 379; Potassium 3.2; Sodium 134    Lipid Panel    Component Value Date/Time   CHOL 156 05/14/2009 2201   TRIG 226 (H) 05/14/2009 2201   HDL 26 (L) 05/14/2009 2201   CHOLHDL 6.0 Ratio 05/14/2009 2201   VLDL 45 (H) 05/14/2009 2201   LDLCALC 85 05/14/2009 2201      Wt Readings from Last 3 Encounters:  09/14/17 100.2 kg (220 lb 12.8 oz)  05/28/17 99.8 kg (220 lb)  04/10/17 96.4 kg (212 lb 8.4 oz)      ASSESSMENT AND PLAN:  # Chronic systolic and diastolic heart failure:  Mr. Ghrist's stress test 07/2016 revealed LVEF 26% and evidence of prior infarct. He continues to have exertional symptoms. He appears to be euvolemic on exam today but does have a history of intermittent edema. We will get a cardiac MRI to evaluate his current systolic function as well as evidence of scar. I suspect that he does have significant coronary artery disease and will need a more invasive assessment of his coronary arteries. However, it will be helpful to know if he has viable myocardium prior to obtaining a heart catheterization. His blood pressure is mildly elevated today. We will try to get him on guideline directed therapy by starting losartan 12.5 mg daily and metoprolol succinate 25 mg daily. He will need a repeat LVEF assessment after 3 months of therapy to determine if he needs an ICD.  # Presumed CAD and prior MI: # Hyperlipidemia:  Lexiscan Myoview showed evidence of a prior infarct. It is possible that the episode 5 or 6 years ago when he became acutely fatigued and short of breath may have been a myocardial infarction. Given his poorly-controlled diabetes at the time it is possible that he did not feel any chest pain. We will start aspirin 81 mg daily. We will also get a copy of his most recent lipids. He thinks that his LDL was around 120. It should be less than 70. Repeat lipids and a CMP in 6 weeks. Cardiac MRI as above.  # Pre-surgical risk assessment: Mr. Serpa has very  poor functional capacity.  He has chronic systolic heart failure and likely a prior MI as above.  He is unable to achieve 4 METs without shortness of breath.  We will proceed with the above testing and start  him on guideline-based therapy before quoting an operative risk.    Current medicines are reviewed at length with the patient today.  The patient does not have concerns regarding medicines.  The following changes have been made:  Start aspirin, metoprolol, losartan, and rosuvastatin.  Labs/ tests ordered today include:   Orders Placed This Encounter  Procedures  . MR Card Morphology Wo/W Cm  . Basic metabolic panel  . EKG 12-Lead     Disposition:   FU with Viriginia Amendola C. Duke Salvia, MD, [facility] in 1 month    This note was written with the assistance of speech recognition software.  Please excuse any transcriptional errors.  Signed, Johann Gascoigne C. Duke Salvia, MD, [facility]  09/14/2017 1:03 PM    [facility]

## 2017-09-14 ENCOUNTER — Encounter: Payer: Self-pay | Admitting: Cardiovascular Disease

## 2017-09-14 ENCOUNTER — Ambulatory Visit (INDEPENDENT_AMBULATORY_CARE_PROVIDER_SITE_OTHER): Payer: Medicaid Other | Admitting: Cardiovascular Disease

## 2017-09-14 VITALS — BP 132/79 | HR 77 | Ht 74.0 in | Wt 220.8 lb

## 2017-09-14 DIAGNOSIS — R0602 Shortness of breath: Secondary | ICD-10-CM | POA: Diagnosis not present

## 2017-09-14 DIAGNOSIS — R931 Abnormal findings on diagnostic imaging of heart and coronary circulation: Secondary | ICD-10-CM

## 2017-09-14 DIAGNOSIS — I2510 Atherosclerotic heart disease of native coronary artery without angina pectoris: Secondary | ICD-10-CM

## 2017-09-14 DIAGNOSIS — I5043 Acute on chronic combined systolic (congestive) and diastolic (congestive) heart failure: Secondary | ICD-10-CM

## 2017-09-14 DIAGNOSIS — E7800 Pure hypercholesterolemia, unspecified: Secondary | ICD-10-CM | POA: Diagnosis not present

## 2017-09-14 DIAGNOSIS — Z01818 Encounter for other preprocedural examination: Secondary | ICD-10-CM | POA: Diagnosis not present

## 2017-09-14 DIAGNOSIS — R9430 Abnormal result of cardiovascular function study, unspecified: Secondary | ICD-10-CM

## 2017-09-14 HISTORY — DX: Atherosclerotic heart disease of native coronary artery without angina pectoris: I25.10

## 2017-09-14 HISTORY — DX: Acute on chronic combined systolic (congestive) and diastolic (congestive) heart failure: I50.43

## 2017-09-14 LAB — BASIC METABOLIC PANEL
BUN / CREAT RATIO: 17 (ref 9–20)
BUN: 12 mg/dL (ref 6–24)
CO2: 23 mmol/L (ref 20–29)
Calcium: 9.2 mg/dL (ref 8.7–10.2)
Chloride: 101 mmol/L (ref 96–106)
Creatinine, Ser: 0.7 mg/dL — ABNORMAL LOW (ref 0.76–1.27)
GFR calc Af Amer: 131 mL/min/{1.73_m2} (ref >59)
GFR, EST NON AFRICAN AMERICAN: 113 mL/min/{1.73_m2} (ref >59)
GLUCOSE: 198 mg/dL — AB (ref 65–99)
POTASSIUM: 5.1 mmol/L (ref 3.5–5.2)
SODIUM: 138 mmol/L (ref 134–144)

## 2017-09-14 MED ORDER — LOSARTAN POTASSIUM 25 MG PO TABS: ORAL_TABLET | ORAL | 5 refills | Status: DC

## 2017-09-14 MED ORDER — METOPROLOL SUCCINATE ER 25 MG PO TB24: 25.0000 mg | ORAL_TABLET | Freq: Every day | ORAL | 5 refills | Status: DC

## 2017-09-14 MED ORDER — ROSUVASTATIN CALCIUM 40 MG PO TABS: 40.0000 mg | ORAL_TABLET | Freq: Every day | ORAL | 5 refills | Status: DC

## 2017-09-14 NOTE — Patient Instructions (Signed)
Medication Instructions:  START ASPIRIN 81 MG DAILY   START ROSUVASTATIN 40 MG DAILY   START LOSARTAN 25 MG 1/2 TABLET DAILY  START METOPROLOL SUC 25 MG DAILY   Labwork: BMET TODAY  Testing/Procedures: Your physician has requested that you have a cardiac MRI. Cardiac MRI uses a computer to create images of your heart as its beating, producing both still and moving pictures of your heart and major blood vessels. For further information please visit [email or web address]. Please follow the instruction sheet given to you today for more information.  Follow-Up: Your physician recommends that you schedule a follow-up appointment in: 1 MONTH

## 2017-09-16 ENCOUNTER — Encounter: Payer: Self-pay | Admitting: Cardiovascular Disease

## 2017-09-16 ENCOUNTER — Ambulatory Visit (INDEPENDENT_AMBULATORY_CARE_PROVIDER_SITE_OTHER): Payer: Medicaid Other | Admitting: Family Medicine

## 2017-09-16 ENCOUNTER — Encounter: Payer: Self-pay | Admitting: Family Medicine

## 2017-09-16 VITALS — BP 140/82 | HR 79 | Temp 97.9°F | Ht 74.0 in | Wt 220.0 lb

## 2017-09-16 DIAGNOSIS — F418 Other specified anxiety disorders: Secondary | ICD-10-CM | POA: Diagnosis not present

## 2017-09-16 DIAGNOSIS — Z23 Encounter for immunization: Secondary | ICD-10-CM

## 2017-09-16 DIAGNOSIS — I5043 Acute on chronic combined systolic (congestive) and diastolic (congestive) heart failure: Secondary | ICD-10-CM | POA: Diagnosis not present

## 2017-09-16 DIAGNOSIS — E1142 Type 2 diabetes mellitus with diabetic polyneuropathy: Secondary | ICD-10-CM

## 2017-09-16 DIAGNOSIS — F330 Major depressive disorder, recurrent, mild: Secondary | ICD-10-CM | POA: Insufficient documentation

## 2017-09-16 DIAGNOSIS — E1165 Type 2 diabetes mellitus with hyperglycemia: Secondary | ICD-10-CM

## 2017-09-16 LAB — POCT GLYCOSYLATED HEMOGLOBIN (HGB A1C): Hemoglobin A1C: 6.3

## 2017-09-16 NOTE — Progress Notes (Signed)
Subjective:   Patient ID: Travis [name]    DOB: [birth year], 46 y.o. male   MRN: [id number]  CC: "Establish PCP"  HPI: Travis [name] is a 46 y.o. male who presents to clinic today to establish care. Problems discussed today are as follows:  Diabetes: Chronic issue for many years. Currently taking 20 units of Lantus daily. Recently self discontinued metformin 1000 mg twice daily due to GI complaints. Usually checks his fasting glucose level ranging 100-117 with no highs and low 79. Has taken Reglan for over one year for gastroparesis which has been stable. 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 Has established care with an ophthalmologist. Recently had his kidneys checked during his cardiology visit with normal levels. ROS: Denies fevers or chills, chest pain, shortness of breath, nausea or vomiting, abdominal pain, feelings of fatigue.  Heart failure: Patient underwent Myoview on 07/2016 found to have EF of 26% with apical and mid to distal anterior inferior hypokinesis. Unfortunately, his prior PCP did not follow-up with these results and he failed to follow-up with a cardiologist until a few days ago for cardiac clearance due to gallbladder issues which have remained stable. He was started on multiple heart medications yesterday. He cannot recall any history of MI. Patient does endorse history of lower extremity edema with improvement after elevation. ROS: Denies chest pain, short of breath, palpitations, orthopnea.  Anxiety and depression: Chronic issue for many years without treatment. Was originally seen by [facility] "many years ago" but did not follow back up because he "did not like meeting new people." He cannot recall taking any medication for his anxiety or depression. He states his ongoing issue would like help but denies any history of suicidal or homicidal thoughts or ideations.  Complete  ROS performed, see HPI for pertinent.  PMFSH: CAD (on ASA) and HFrEF (26%, 07/2016), IDDM with gastroparesis, neuropathy, ED, and h/o DKA, h/o osteomyelitis of L-foot, OA with vit-D deficiency. 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 Family history COPD, heart failure, esophogeal cancer (fatehr), fibromyalgia, DM. Smoking status reviewed. Medications reviewed.  Objective:   BP 140/82   Pulse 79   Temp 97.9 F (36.6 C) (Oral)   Ht  (1.88 m)   Wt 220 lb (99.8 kg)   SpO2 98%   BMI 28.25 kg/m  Vitals and nursing note reviewed.  General: well nourished, well developed, in no acute distress with non-toxic appearance HEENT: normocephalic, atraumatic, moist mucous membranes Neck: supple, non-tender without lymphadenopathy CV: regular rate and rhythm without murmurs, rubs, or gallops, no lower extremity edema Lungs: clear to auscultation bilaterally with normal work of breathing Abdomen: soft, non-tender, non-distended, no masses or organomegaly palpable, normoactive bowel sounds Skin: warm, dry, no rashes or lesions, cap refill < 2 seconds Extremities: warm and well perfused, normal tone Psych: euthymic mood, congruent affect, not tremulous, pleasant  Assessment & Plan:   Acute on chronic systolic and diastolic heart failure, NYHA class 3 (HCC) Chronic. Recently established with cardiology. No signs of fluid overload. --Continue following Dr. Duke Salvia for cardiology management --Will await testing of MR for heart  Diabetes type 2, uncontrolled (HCC) Chronic. Controlled. A1c 6.3. On insulin and metformin, though self discontinuation of metformin due to GI side effects. No recent screen for TSH. Establish with ophthalmology. --Will check TSH --Continue Levemir 20 units daily, and stop  metformin 1000 mg twice daily due to GI side effects, would consider XR-forumula if A1c elevated in 3  months  Anxiety with depression Chronic. Uncontrolled. PHQ9 score 18, extremely difficult. No SI/HI. Patient is endorsing social phobia versus agoraphobia given symptoms are provoked around meeting new people and being in large crowds. --Advised patient to return in 2 weeks to discuss medication and therapeutic options, seems agreeable to seeing [facility]  Orders Placed This Encounter  Procedures  . Flu Vaccine QUAD 36+ mos IM  . TSH  . POCT HgB A1C (CPT 83036)   Meds ordered this encounter  Medications  . gabapentin (NEURONTIN) 800 MG tablet    Sig: Take 800 mg by mouth 3 (three) times daily.    Durward Parcel, DO [facility], PGY-2 09/16/2017 1:59 PM

## 2017-09-16 NOTE — Assessment & Plan Note (Addendum)
Chronic. Recently established with cardiology. No signs of fluid overload. --Continue following Dr. Duke Salvia for cardiology management --Will await testing of MR for heart

## 2017-09-16 NOTE — Assessment & Plan Note (Addendum)
Chronic. Uncontrolled. PHQ9 score 18, extremely difficult. No SI/HI. Patient is endorsing social phobia versus agoraphobia given symptoms are provoked around meeting new people and being in large crowds. --Advised patient to return in 2 weeks to discuss medication and therapeutic options, seems agreeable to seeing [facility]

## 2017-09-16 NOTE — Assessment & Plan Note (Addendum)
Chronic. Controlled. A1c 6.3. On insulin and metformin, though self discontinuation of metformin due to GI side effects. No recent screen for TSH. Establish with ophthalmology. --Will check TSH --Continue Levemir 20 units daily, and stop metformin 1000 mg twice daily due to GI side effects, would consider XR-forumula if A1c elevated in 3 months

## 2017-09-16 NOTE — Patient Instructions (Signed)
Thank you for coming in to see [place] today. Please see below to review our plan for today's visit.  1. I will check your thyroid function today. I will call you if your levels are abnormal otherwise we can discuss your next visit. 2. You are now up to date on your annual flu shot. 3. I would like to see you in 2 weeks to discuss your feelings of anxiety and depression.  Please call the clinic at [phone] if your symptoms worsen or you have any concerns. It was my pleasure to see you. -- Durward Parcel, DO [facility], PGY-2

## 2017-09-17 LAB — TSH: TSH: 1.74 u[IU]/mL (ref 0.450–4.500)

## 2017-09-19 ENCOUNTER — Encounter: Payer: Self-pay | Admitting: Family Medicine

## 2017-09-19 ENCOUNTER — Telehealth: Payer: Self-pay | Admitting: *Deleted

## 2017-09-19 NOTE — Telephone Encounter (Signed)
Travis [name] is returning a call . Thanks

## 2017-09-19 NOTE — Telephone Encounter (Signed)
-----   Message from Chilton Si, MD sent at 09/18/2017  1:38 PM EDT ----- Kidney function and electrolytes are normal. Blood glucose is elevated.

## 2017-09-19 NOTE — Telephone Encounter (Signed)
Left message to call back  

## 2017-09-19 NOTE — Telephone Encounter (Signed)
Advised patient of lab results  

## 2017-09-28 ENCOUNTER — Encounter: Payer: Self-pay | Admitting: Family Medicine

## 2017-09-28 ENCOUNTER — Ambulatory Visit (INDEPENDENT_AMBULATORY_CARE_PROVIDER_SITE_OTHER): Payer: Medicaid Other | Admitting: Family Medicine

## 2017-09-28 ENCOUNTER — Encounter: Payer: Self-pay | Admitting: Licensed Clinical Social Worker

## 2017-09-28 VITALS — BP 108/60 | HR 69 | Temp 97.5°F | Ht 74.0 in | Wt 226.0 lb

## 2017-09-28 DIAGNOSIS — F418 Other specified anxiety disorders: Secondary | ICD-10-CM | POA: Diagnosis not present

## 2017-09-28 MED ORDER — SERTRALINE HCL 50 MG PO TABS: 50.0000 mg | ORAL_TABLET | Freq: Every day | ORAL | 0 refills | Status: DC
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
## 2017-09-28 NOTE — Progress Notes (Addendum)
ESTIMATE TIME:15 minutes Type of Service: Integrated Behavioral Health warm handoff  Interpreter:No.   SUBJECTIVE: Travis [name] is a 46 y.o. male referred by Dr. Abelardo DieselMcMullen for: symptoms of  anxiety and depression.and stressors related to: financial concern and employment concerns.  Patient is a little anxious but pleasant. Patient reports : difficulty with managing his anxiety and struggling with not being able to provide financially for his family. Patient states he went to therapy two times and did not return because he does not like being around people.  Risk of harm to self or others: No plan to harm self or others  LIFE CONTEXT:  Family & Social:patient lives with his children ( 17,19 and 420) , extended family is his support system  School / Work /Fun: works as a Medical sales representativepizza delivery person, has applied for disability and has an Pensions consultantattorney,   Life changes: 1 1/2 years ago changes in health, no longer able to work his previous job, financial difficulties  GOALS: Patient will reduce symptoms of: anxiety and depression, and increase :coping skills and self-management skills,He will also Increase healthy adjustment to current life circumstances.  INTERVENTION:  Mindfulness or Management consultantelaxation Training, Reflective listening, ), Psychoeducation    PHQ 9=18,Severity: severe depression. GAD-7=16,Severity: severe anxiety.   ISSUES DISCUSSED: Integrated care services, support system, previous and current coping skills, community support, things patient enjoy or use to enjoy doing, and demonstration of relaxed breathing.    ASSESSMENT:Patient currently experiencing symptoms of anxiety and depression.  Symptoms exacerbated by social stressors and chronic medical conditions. Patient may benefit from, and is in agreement to receive further assessment and brief therapeutic interventions to assist with managing his symptoms.   PLAN:   [facility]  2.Behavioral recommendations: relaxed  breathing .3.Referral:Integrated Behavioral Health Services (In Clinic),  4. Will start Zoloft Rx provided by PCP  Warm Hand Off Completed.     Sammuel Hineseborah Quinne Pires, LCSW Licensed Clinical Social Worker [facility]   [phone] 2:43 PM

## 2017-09-28 NOTE — Patient Instructions (Signed)
Thank you for coming in to see Travis [name] today. Please see below to review our plan for today's visit.  I have prescribed you a medication called Zoloft (cetirizine). Take one 50 mg tablet daily. I would like to see you in 2 weeks for follow-up at which point we can increase the dose. Be sure to follow up with your appointments with behavioral health while on this medication as these together much more effective at treating your symptoms.  Please call the clinic at [phone] if your symptoms worsen or you have any concerns. It was my pleasure to see you. -- Durward Parcelavid McMullen, DO [facility], PGY-2
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
## 2017-10-05 ENCOUNTER — Telehealth: Payer: Self-pay | Admitting: Licensed Clinical Social Worker

## 2017-10-05 ENCOUNTER — Telehealth: Payer: Self-pay | Admitting: Cardiovascular Disease

## 2017-10-05 ENCOUNTER — Ambulatory Visit (HOSPITAL_COMMUNITY)
Admission: RE | Admit: 2017-10-05 | Discharge: 2017-10-05 | Disposition: A | Discharge status: Home / Self Care | Payer: Medicaid Other | Source: Ambulatory Visit | Attending: Cardiovascular Disease | Admitting: Cardiovascular Disease

## 2017-10-05 DIAGNOSIS — R931 Abnormal findings on diagnostic imaging of heart and coronary circulation: Secondary | ICD-10-CM | POA: Insufficient documentation

## 2017-10-05 DIAGNOSIS — I517 Cardiomegaly: Secondary | ICD-10-CM | POA: Diagnosis not present

## 2017-10-05 DIAGNOSIS — R0602 Shortness of breath: Secondary | ICD-10-CM

## 2017-10-05 DIAGNOSIS — I255 Ischemic cardiomyopathy: Secondary | ICD-10-CM | POA: Diagnosis not present

## 2017-10-05 DIAGNOSIS — R9430 Abnormal result of cardiovascular function study, unspecified: Secondary | ICD-10-CM

## 2017-10-05 LAB — CREATININE, SERUM
Creatinine, Ser: 0.88 mg/dL (ref 0.61–1.24)
GFR calc Af Amer: >60 mL/min (ref >60)
GFR calc non Af Amer: >60 mL/min (ref >60)

## 2017-10-05 MED ORDER — GADOBENATE DIMEGLUMINE 529 MG/ML IV SOLN
20.0000 mL | Freq: Once | INTRAVENOUS | Status: AC | PRN
  Administered 2017-10-05: 20 mL via INTRAVENOUS

## 2017-10-05 NOTE — Telephone Encounter (Signed)
Patient was seen on 10/1 - ordered to have cardiac MRI and 1 month f/up. Test completed today - result note pending. Spoke with sister and scheduled OV for follow up with MD on 10/13/17  Routed to TiffinMelinda, LPN as Lorain ChildesFYI

## 2017-10-05 NOTE — Progress Notes (Signed)
Integrated Care f/u phone call to patient reference interventions discussed during joint visit with PCP. Also wanted to see if patient started zoloft as prescribed by PCP and to inform him LCSW would be out of the office Friday and that he would see Travis [name].  Left voice message to call LCSW. Plan: LCSW will wait for return call and Travis [name] will see patient on Friday.  Travis Hineseborah Joyce Heitman, LCSW Licensed Clinical Social Worker [facility]   [phone] 2:56 PM

## 2017-10-05 NOTE — Telephone Encounter (Signed)
New message    Pt sister is calling about when pt should have a follow up appt after having MRI. Please call.

## 2017-10-06 NOTE — Progress Notes (Deleted)
   Subjective   Patient ID: Travis [name]    DOB: 07/20/1971, 46 y.o. male   MRN: [id number]  CC: "***"  HPI: Travis [name] is a 46 y.o. male who presents to clinic today for the following:  ***: ***  ***Last seen by me 10/17 for anxiety and depression. Started Zoloft. Seeing Information systems [facility]Deborah [facility]. Doing breathing techniques.   ROS: see HPI for pertinent.  PMFSH: CAD (on ASA) and HFrEF (26%, 07/2016), IDDM with gastroparesis, neuropathy, ED, and h/o DKA, h/o osteomyelitis of L-foot, OA with vit-D deficiency. 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 Family history COPD, heart failure, esophogeal cancer (fatehr), fibromyalgia, DM. Smoking status reviewed. Medications reviewed.  Objective   There were no vitals taken for this visit. Vitals and nursing note reviewed.  General: well nourished, well developed, in no acute distress with non-toxic appearance HEENT: normocephalic, atraumatic, moist mucous membranes Neck: supple, non-tender without lymphadenopathy CV: regular rate and rhythm without murmurs, rubs, or gallops, no lower extremity edema Lungs: clear to auscultation bilaterally with normal work of breathing Abdomen: soft, non-tender, non-distended, no masses or organomegaly palpable, normoactive bowel sounds Skin: warm, dry, no rashes or lesions, cap refill < 2 seconds Extremities: warm and well perfused, normal tone  Assessment & Plan   No problem-specific Assessment & Plan notes found for this encounter.  No orders of the defined types were placed in this encounter.  No orders of the defined types were placed in this encounter.   Durward Parcelavid Evora Schechter, DO [facility], PGY-2 10/06/2017 1:17 PM

## 2017-10-07 ENCOUNTER — Ambulatory Visit: Payer: Medicaid Other | Admitting: Family Medicine

## 2017-10-07 ENCOUNTER — Ambulatory Visit: Payer: Medicaid Other

## 2017-10-09 NOTE — H&P (View-Only) (Signed)
Cardiology Office Note   Date:  10/13/2017   ID:  Travis [name], DOB 07/02/1971, MRN [id number]  PCP:  Travis [name], David J, DO  Cardiologist:   Chilton Siiffany Hyder, MD  General Surgeon: Dr. Gaynelle AduEric Wilson   No chief complaint on file.    History of Present Illness: Travis [name] is a 46 y.o. male with chronic systolic and diastolic heart failure, diabetes, and hyperlipidemia, who presents for follow up.  He was initially seen 09/14/17 for pre-surgical risk assessment prior to cholesystectomy.  [name] was evaluated by Dr. Gaynelle AduEric Wilson for gallbladder stones and biliary dyskinesia.  At that appointment he reported exertional dyspnea.  It was noted that [name] had a Lexiscan Myoview 07/29/16 that revealed LVEF 26% with infarct in the mid anteroseptal, inferoseptal and apical regions.  There was no reported ischemia.  Hypokinesis was noted in the apical and mild-distal anterior and inferior walls.  Dr. Andrey CampanileWilson recommended laparoscopic cholecystectomy but he was referred for cardiac clearance. Since his stress test [name] developed osteomyelitis of the L 5th toe and underwent general anesthesia 10/2016.  [name] was diagnosed with diabetes in around 2008.  He did not have insurance and his diabetes was not under control for many years. In 2018 he started working hard and his hemoglobin A1c has been reduced from 11% to 5%.   At his last appointment [name] reported feeling very fatigued and had exertional dyspnea.  He was started on aspirin, metoprolol, losartan, and rosuvastatin.  He has tolerated these medications well.  He was referred for a cardiac MRI that revealed LVEF 41% with a moderately dilated left ventricle with an apical aneurysm involving apex, apical inferior, septal, and anterior walls.  There was akinesis of the mid anteroseptal and inferoseptal walls. There was nearly transmural late gadolinium enhancement in the mid-apical anterior, septal and inferior walls with no chance  of recovery with revascularization.  The inferoseptum had good change of recovery with revascularization.  There was also mild MR. since his last appointment he has been stable.  He continues to have occasional episodes of orthopnea.  He has not experienced any chest pain or pressure.  He denies shortness of breath.  However he gets very drained with any activity.  He has not had any flares of his gallbladder since 05/2017.   Past Medical History:  Diagnosis Date  . Acute on chronic systolic and diastolic heart failure, NYHA class 3 (HCC) 09/14/2017  . Acute osteomyelitis of metatarsal bone of left foot (HCC) 11/12/2015  . CAD in native artery 09/14/2017  . Closed fracture of right tibial plateau 11/11/2015  . Depression with anxiety   . Diabetes mellitus    INSULIN DEPENDENT Type II  . Diabetic peripheral neuropathy associated with type 2 diabetes mellitus (HCC) 08/30/2008   Qualifier: Diagnosis of  By: Daphine DeutscherMartin FNP, Zena AmosNykedtra    . Diabetic ulcer of left foot (HCC) 11/14/2015  . Gastroparesis   . GERD (gastroesophageal reflux disease)   . Osteomyelitis (HCC)   . Osteoporosis 11/12/2015  . Peripheral neuropathy   . Shortness of breath dyspnea    with exertion  . Vitamin D deficiency 11/12/2015    Past Surgical History:  Procedure Laterality Date  . AMPUTATION Left 11/28/2015   Procedure: Left 4th Ray Amputation;  Surgeon: Nadara MustardMarcus Duda V, MD;  [facility];  Service: Orthopedics;  Laterality: Left;  . AMPUTATION Left 10/29/2016   Procedure: LEFT 5TH RAY AMPUTATION;  Surgeon: Nadara MustardMarcus V Duda, MD;  Location: [facility] OR;  Service: Orthopedics;  Laterality: Left;  . ANTERIOR VITRECTOMY Left 04/12/2016   Procedure: ANTERIOR CHAMBER WASH OUT WITH GAS FLUID EXCHANGE;  Surgeon: Carmela Rima, MD;  Location: [facility];  Service: Ophthalmology;  Laterality: Left;  . CATARACT EXTRACTION Left   . EYE SURGERY     left eye surgery 04/2016  . KNEE SURGERY Left   . SHOULDER SURGERY Right   . TOE AMPUTATION Left  11/28/15   4th toe     Current Outpatient Prescriptions  Medication Sig Dispense Refill  . aspirin EC 81 MG tablet Take 81 mg by mouth daily.    Marland Kitchen gabapentin (NEURONTIN) 800 MG tablet Take 800 mg by mouth 3 (three) times daily.    . insulin detemir (LEVEMIR) 100 UNIT/ML injection Inject 0.2 mLs (20 Units total) into the skin at bedtime. 30 mL 3  . losartan (COZAAR) 25 MG tablet 1/2 tablet by mouth daily 15 tablet 5  . metoCLOPramide (REGLAN) 10 MG tablet Take 1 tablet (10 mg total) by mouth 4 (four) times daily -  before meals and at bedtime. 90 tablet 1  . metoprolol succinate (TOPROL XL) 25 MG 24 hr tablet Take 1 tablet (25 mg total) by mouth daily. 30 tablet 5  . rosuvastatin (CRESTOR) 40 MG tablet Take 1 tablet (40 mg total) by mouth daily. 30 tablet 5   No current facility-administered medications for this visit.     Allergies:   No known allergies    Social History:  The patient  reports that he has never smoked. He has never used smokeless tobacco. He reports that he does not drink alcohol or use drugs.   Family History:  The patient's family history includes Anxiety disorder in his mother; COPD in his mother; Cancer in his paternal grandmother; Depression in his mother; Diabetes in his maternal grandfather, maternal grandmother, maternal uncle, and sister; Esophageal cancer in his father; Fibromyalgia in his mother and sister; GER disease in his sister; Heart failure in his mother; Hemachromatosis in his paternal grandfather; Liver disease in his paternal grandfather.    ROS:  Please see the history of present illness.   Otherwise, review of systems are positive for none.   All other systems are reviewed and negative.    PHYSICAL EXAM: VS:  BP 124/68   Pulse 68   Ht 6\' 2"  (1.88 m)   Wt 103.4 kg (228 lb)   BMI 29.27 kg/m  , BMI Body mass index is 29.27 kg/m. GENERAL:  Chronically ill-appearing HEENT: Pupils equal round and reactive, fundi not visualized, oral mucosa  unremarkable NECK:  No jugular venous distention, waveform within normal limits, carotid upstroke brisk and symmetric, no bruits, no thyromegaly LYMPHATICS:  No cervical adenopathy LUNGS:  Clear to auscultation bilaterally HEART:  RRR.  PMI not displaced or sustained,S1 and S2 within normal limits, no S3, no S4, no clicks, no rubs, no murmurs ABD:  Flat, positive bowel sounds normal in frequency in pitch, no bruits, no rebound, no guarding, no midline pulsatile mass, no hepatomegaly, no splenomegaly EXT:  2 plus pulses throughout, no edema, no cyanosis no clubbing.  Bilateral thenar and hypothenar wasting.  LE muscle wasting.   SKIN:  No rashes no nodules NEURO:  Cranial nerves II through XII grossly intact, motor grossly intact throughout PSYCH:  Cognitively intact, oriented to person place and time    EKG:  EKG is not ordered today. The ekg ordered 09/14/17 demonstrates sinus rhythm. Rate 77 bpm. Prior anterior  infarct. Inferior ST elevations <27mm.  cannot rule out prior inferior infarct. Unchanged from 05/21/16.  Lexiscan Myoview 07/29/16:  Defect 1: There is a medium defect of severe severity present in the mid anteroseptal, mid inferoseptal and apex location.  Findings consistent with prior myocardial infarction. There is no ischemia identified.  This is an intermediate risk study based upon severely reduced ejection fraction consistent with prior infarction.  The left ventricular ejection fraction is severely decreased (<30%).  Nuclear stress EF: 26%.  Cardiac MRI 10/05/17: IMPRESSION: 1. Moderately dilated left ventricle with mild basal septal hypertrophy and moderately decreased systolic function (LVEF = 41%). There is apical aneurysm involving apical inferior, septal, anterior walls and true apex and akinesis of the mid anteroseptal and inferoseptal walls. There is no evidence for a thrombus.  There is almost transmural late gadolinium enhancement in the mid anteroseptal,  apical anterior, septal, inferior walls and in the true septum with no chance of recovery if revascularized. Mid inferoseptal wall has good chance of recovery if revascularized.  2. Normal right ventricular size, thickness and systolic function (LVEF =63%). There are no regional wall motion abnormalities.  3. Mild mitral and trivial tricuspid regurgitation.  4. Trivial pericardial effusion with no signs of tamponade. Recent Labs: 04/11/2017: Magnesium 1.8 05/28/2017: ALT 24; Hemoglobin 14.7; Platelets 379 09/14/2017: BUN 12; Potassium 5.1; Sodium 138 09/16/2017: TSH 1.740 10/05/2017: Creatinine, Ser 0.88    Lipid Panel    Component Value Date/Time   CHOL 156 05/14/2009 2201   TRIG 226 (H) 05/14/2009 2201   HDL 26 (L) 05/14/2009 2201   CHOLHDL 6.0 Ratio 05/14/2009 2201   VLDL 45 (H) 05/14/2009 2201   LDLCALC 85 05/14/2009 2201      Wt Readings from Last 3 Encounters:  10/13/17 103.4 kg (228 lb)  09/28/17 102.5 kg (226 lb)  09/16/17 99.8 kg (220 lb)      ASSESSMENT AND PLAN:  # Chronic systolic and diastolic heart failure:  Mr. Osterman's stress test 07/2016 revealed LVEF 26% and evidence of prior infarct. Car showed that his LVEF has improved to 41%.  diac MRI he has extensive scar consistent with prior MI in the LAD distribution.  There is some reversibility in the inferoseptum.  There is a high likelihood he is tolerating losartan and metoprolol well.that he has 3 vessel CAD.  We will get a LHC and RHC given his persistent symptoms.  No ICD given that his LVEF is now >35%.    # Presumed CAD and prior MI: # Hyperlipidemia:  Lexiscan Myoview showed evidence of a prior infarct.  Cardiac MRI consistent with a large LAD infarct.  LHC as above.  Continue aspirin and rosuvastatin.  He will need lipids and CMP in a couple weeks.  Goal LDL <70.  He would benefit from cardiac rehab.  Consider after LHC.    # Pre-surgical risk assessment: Mr. Mahany has very poor functional capacity.   He has chronic systolic heart failure and likely a prior MI as above.  He is unable to achieve 4 METs without shortness of breath.  We will proceed with the above testing.  Given that he has not had any recurrent flares of his gallbladder in over 6 months we will proceed with his cardiac workup before clearing him for surgery.  Risks and benefits of cardiac catheterization have been discussed with the patient.  The patient understands that risks included but are not limited to stroke (1 in 1000), death (1 in 1000), kidney failure [usually temporary] (  1 in 500), bleeding (1 in 200), allergic reaction [possibly serious] (1 in 200). The patient understands and agrees to proceed.    Current medicines are reviewed at length with the patient today.  The patient does not have concerns regarding medicines.  The following changes have been made:  None   Labs/ tests ordered today include:   Orders Placed This Encounter  Procedures  . CBC with Differential/Platelet  . Basic metabolic panel  . INR/PT     Disposition:   FU with Azhia Siefken C. Duke Salvia, MD, [facility] in 3 months   This note was written with the assistance of speech recognition software.  Please excuse any transcriptional errors.  Signed, Idali Lafever C. Duke Salvia, MD, [facility]  10/13/2017 12:40 PM    [facility]

## 2017-10-09 NOTE — Progress Notes (Signed)
Cardiology Office Note   Date:  10/13/2017   ID:  Travis [name], DOB 07/02/1971, MRN [id number]  PCP:  Wendee BeaversMcMullen, David J, DO  Cardiologist:   Chilton Siiffany Hyder, MD  General Surgeon: Dr. Gaynelle AduEric Wilson   No chief complaint on file.    History of Present Illness: Travis [name] is a 46 y.o. male with chronic systolic and diastolic heart failure, diabetes, and hyperlipidemia, who presents for follow up.  He was initially seen 09/14/17 for pre-surgical risk assessment prior to cholesystectomy.  Travis [name] was evaluated by Dr. Gaynelle AduEric Wilson for gallbladder stones and biliary dyskinesia.  At that appointment he reported exertional dyspnea.  It was noted that Travis [name] had a Lexiscan Myoview 07/29/16 that revealed LVEF 26% with infarct in the mid anteroseptal, inferoseptal and apical regions.  There was no reported ischemia.  Hypokinesis was noted in the apical and mild-distal anterior and inferior walls.  Dr. Andrey CampanileWilson recommended laparoscopic cholecystectomy but he was referred for cardiac clearance. Since his stress test Travis [name] developed osteomyelitis of the L 5th toe and underwent general anesthesia 10/2016.  Travis [name] was diagnosed with diabetes in around 2008.  He did not have insurance and his diabetes was not under control for many years. In 2018 he started working hard and his hemoglobin A1c has been reduced from 11% to 5%.   At his last appointment Travis [name] reported feeling very fatigued and had exertional dyspnea.  He was started on aspirin, metoprolol, losartan, and rosuvastatin.  He has tolerated these medications well.  He was referred for a cardiac MRI that revealed LVEF 41% with a moderately dilated left ventricle with an apical aneurysm involving apex, apical inferior, septal, and anterior walls.  There was akinesis of the mid anteroseptal and inferoseptal walls. There was nearly transmural late gadolinium enhancement in the mid-apical anterior, septal and inferior walls with no chance  of recovery with revascularization.  The inferoseptum had good change of recovery with revascularization.  There was also mild MR. since his last appointment he has been stable.  He continues to have occasional episodes of orthopnea.  He has not experienced any chest pain or pressure.  He denies shortness of breath.  However he gets very drained with any activity.  He has not had any flares of his gallbladder since 05/2017.   Past Medical History:  Diagnosis Date  . Acute on chronic systolic and diastolic heart failure, NYHA class 3 (HCC) 09/14/2017  . Acute osteomyelitis of metatarsal bone of left foot (HCC) 11/12/2015  . CAD in native artery 09/14/2017  . Closed fracture of right tibial plateau 11/11/2015  . Depression with anxiety   . Diabetes mellitus    INSULIN DEPENDENT Type II  . Diabetic peripheral neuropathy associated with type 2 diabetes mellitus (HCC) 08/30/2008   Qualifier: Diagnosis of  By: Daphine DeutscherMartin FNP, Zena AmosNykedtra    . Diabetic ulcer of left foot (HCC) 11/14/2015  . Gastroparesis   . GERD (gastroesophageal reflux disease)   . Osteomyelitis (HCC)   . Osteoporosis 11/12/2015  . Peripheral neuropathy   . Shortness of breath dyspnea    with exertion  . Vitamin D deficiency 11/12/2015    Past Surgical History:  Procedure Laterality Date  . AMPUTATION Left 11/28/2015   Procedure: Left 4th Ray Amputation;  Surgeon: Nadara MustardMarcus Duda V, MD;  [facility];  Service: Orthopedics;  Laterality: Left;  . AMPUTATION Left 10/29/2016   Procedure: LEFT 5TH RAY AMPUTATION;  Surgeon: Nadara MustardMarcus V Duda, MD;  Location: [facility] OR;  Service: Orthopedics;  Laterality: Left;  . ANTERIOR VITRECTOMY Left 04/12/2016   Procedure: ANTERIOR CHAMBER WASH OUT WITH GAS FLUID EXCHANGE;  Surgeon: Carmela Rima, MD;  Location: [facility] OR;  Service: Ophthalmology;  Laterality: Left;  . CATARACT EXTRACTION Left   . EYE SURGERY     left eye surgery 04/2016  . KNEE SURGERY Left   . SHOULDER SURGERY Right   . TOE AMPUTATION Left  11/28/15   4th toe     Current Outpatient Prescriptions  Medication Sig Dispense Refill  . aspirin EC 81 MG tablet Take 81 mg by mouth daily.    Marland Kitchen gabapentin (NEURONTIN) 800 MG tablet Take 800 mg by mouth 3 (three) times daily.    . insulin detemir (LEVEMIR) 100 UNIT/ML injection Inject 0.2 mLs (20 Units total) into the skin at bedtime. 30 mL 3  . losartan (COZAAR) 25 MG tablet 1/2 tablet by mouth daily 15 tablet 5  . metoCLOPramide (REGLAN) 10 MG tablet Take 1 tablet (10 mg total) by mouth 4 (four) times daily -  before meals and at bedtime. 90 tablet 1  . metoprolol succinate (TOPROL XL) 25 MG 24 hr tablet Take 1 tablet (25 mg total) by mouth daily. 30 tablet 5  . rosuvastatin (CRESTOR) 40 MG tablet Take 1 tablet (40 mg total) by mouth daily. 30 tablet 5   No current facility-administered medications for this visit.     Allergies:   No known allergies    Social History:  The patient  reports that he has never smoked. He has never used smokeless tobacco. He reports that he does not drink alcohol or use drugs.   Family History:  The patient's family history includes Anxiety disorder in his mother; COPD in his mother; Cancer in his paternal grandmother; Depression in his mother; Diabetes in his maternal grandfather, maternal grandmother, maternal uncle, and sister; Esophageal cancer in his father; Fibromyalgia in his mother and sister; GER disease in his sister; Heart failure in his mother; Hemachromatosis in his paternal grandfather; Liver disease in his paternal grandfather.    ROS:  Please see the history of present illness.   Otherwise, review of systems are positive for none.   All other systems are reviewed and negative.    PHYSICAL EXAM: VS:  BP 124/68   Pulse 68   Ht 6\' 2"  (1.88 m)   Wt 103.4 kg (228 lb)   BMI 29.27 kg/m  , BMI Body mass index is 29.27 kg/m. GENERAL:  Chronically ill-appearing HEENT: Pupils equal round and reactive, fundi not visualized, oral mucosa  unremarkable NECK:  No jugular venous distention, waveform within normal limits, carotid upstroke brisk and symmetric, no bruits, no thyromegaly LYMPHATICS:  No cervical adenopathy LUNGS:  Clear to auscultation bilaterally HEART:  RRR.  PMI not displaced or sustained,S1 and S2 within normal limits, no S3, no S4, no clicks, no rubs, no murmurs ABD:  Flat, positive bowel sounds normal in frequency in pitch, no bruits, no rebound, no guarding, no midline pulsatile mass, no hepatomegaly, no splenomegaly EXT:  2 plus pulses throughout, no edema, no cyanosis no clubbing.  Bilateral thenar and hypothenar wasting.  LE muscle wasting.   SKIN:  No rashes no nodules NEURO:  Cranial nerves II through XII grossly intact, motor grossly intact throughout PSYCH:  Cognitively intact, oriented to person place and time    EKG:  EKG is not ordered today. The ekg ordered 09/14/17 demonstrates sinus rhythm. Rate 77 bpm. Prior anterior  infarct. Inferior ST elevations <27mm.  cannot rule out prior inferior infarct. Unchanged from 05/21/16.  Lexiscan Myoview 07/29/16:  Defect 1: There is a medium defect of severe severity present in the mid anteroseptal, mid inferoseptal and apex location.  Findings consistent with prior myocardial infarction. There is no ischemia identified.  This is an intermediate risk study based upon severely reduced ejection fraction consistent with prior infarction.  The left ventricular ejection fraction is severely decreased (<30%).  Nuclear stress EF: 26%.  Cardiac MRI 10/05/17: IMPRESSION: 1. Moderately dilated left ventricle with mild basal septal hypertrophy and moderately decreased systolic function (LVEF = 41%). There is apical aneurysm involving apical inferior, septal, anterior walls and true apex and akinesis of the mid anteroseptal and inferoseptal walls. There is no evidence for a thrombus.  There is almost transmural late gadolinium enhancement in the mid anteroseptal,  apical anterior, septal, inferior walls and in the true septum with no chance of recovery if revascularized. Mid inferoseptal wall has good chance of recovery if revascularized.  2. Normal right ventricular size, thickness and systolic function (LVEF =63%). There are no regional wall motion abnormalities.  3. Mild mitral and trivial tricuspid regurgitation.  4. Trivial pericardial effusion with no signs of tamponade. Recent Labs: 04/11/2017: Magnesium 1.8 05/28/2017: ALT 24; Hemoglobin 14.7; Platelets 379 09/14/2017: BUN 12; Potassium 5.1; Sodium 138 09/16/2017: TSH 1.740 10/05/2017: Creatinine, Ser 0.88    Lipid Panel    Component Value Date/Time   CHOL 156 05/14/2009 2201   TRIG 226 (H) 05/14/2009 2201   HDL 26 (L) 05/14/2009 2201   CHOLHDL 6.0 Ratio 05/14/2009 2201   VLDL 45 (H) 05/14/2009 2201   LDLCALC 85 05/14/2009 2201      Wt Readings from Last 3 Encounters:  10/13/17 103.4 kg (228 lb)  09/28/17 102.5 kg (226 lb)  09/16/17 99.8 kg (220 lb)      ASSESSMENT AND PLAN:  # Chronic systolic and diastolic heart failure:  Travis [name]'s stress test 07/2016 revealed LVEF 26% and evidence of prior infarct. Car showed that his LVEF has improved to 41%.  diac MRI he has extensive scar consistent with prior MI in the LAD distribution.  There is some reversibility in the inferoseptum.  There is a high likelihood he is tolerating losartan and metoprolol well.that he has 3 vessel CAD.  We will get a LHC and RHC given his persistent symptoms.  No ICD given that his LVEF is now >35%.    # Presumed CAD and prior MI: # Hyperlipidemia:  Lexiscan Myoview showed evidence of a prior infarct.  Cardiac MRI consistent with a large LAD infarct.  LHC as above.  Continue aspirin and rosuvastatin.  He will need lipids and CMP in a couple weeks.  Goal LDL <70.  He would benefit from cardiac rehab.  Consider after LHC.    # Pre-surgical risk assessment: Travis [name] has very poor functional capacity.   He has chronic systolic heart failure and likely a prior MI as above.  He is unable to achieve 4 METs without shortness of breath.  We will proceed with the above testing.  Given that he has not had any recurrent flares of his gallbladder in over 6 months we will proceed with his cardiac workup before clearing him for surgery.  Risks and benefits of cardiac catheterization have been discussed with the patient.  The patient understands that risks included but are not limited to stroke (1 in 1000), death (1 in 1000), kidney failure [usually temporary] (  1 in 500), bleeding (1 in 200), allergic reaction [possibly serious] (1 in 200). The patient understands and agrees to proceed.    Current medicines are reviewed at length with the patient today.  The patient does not have concerns regarding medicines.  The following changes have been made:  None   Labs/ tests ordered today include:   Orders Placed This Encounter  Procedures  . CBC with Differential/Platelet  . Basic metabolic panel  . INR/PT     Disposition:   FU with Lyndee Herbst C. Duke Salvia, MD, [facility] in 3 months   This note was written with the assistance of speech recognition software.  Please excuse any transcriptional errors.  Signed, Havanah Nelms C. Duke Salvia, MD, [facility]  10/13/2017 12:40 PM    [facility]

## 2017-10-13 ENCOUNTER — Encounter: Payer: Self-pay | Admitting: Cardiovascular Disease

## 2017-10-13 ENCOUNTER — Ambulatory Visit (INDEPENDENT_AMBULATORY_CARE_PROVIDER_SITE_OTHER): Payer: Medicaid Other | Admitting: Cardiovascular Disease

## 2017-10-13 VITALS — BP 124/68 | HR 68 | Ht 74.0 in | Wt 228.0 lb

## 2017-10-13 DIAGNOSIS — I2510 Atherosclerotic heart disease of native coronary artery without angina pectoris: Secondary | ICD-10-CM | POA: Diagnosis not present

## 2017-10-13 DIAGNOSIS — I5043 Acute on chronic combined systolic (congestive) and diastolic (congestive) heart failure: Secondary | ICD-10-CM

## 2017-10-13 DIAGNOSIS — E7800 Pure hypercholesterolemia, unspecified: Secondary | ICD-10-CM

## 2017-10-13 HISTORY — PX: CARDIAC CATHETERIZATION: SHX172

## 2017-10-13 LAB — CBC WITH DIFFERENTIAL/PLATELET
BASOS: 1 %
Basophils Absolute: 0 10*3/uL (ref 0.0–0.2)
EOS (ABSOLUTE): 0.3 10*3/uL (ref 0.0–0.4)
EOS: 4 %
HEMATOCRIT: 36.4 % — AB (ref 37.5–51.0)
Hemoglobin: 12.4 g/dL — ABNORMAL LOW (ref 13.0–17.7)
IMMATURE GRANS (ABS): 0 10*3/uL (ref 0.0–0.1)
IMMATURE GRANULOCYTES: 0 %
Lymphocytes Absolute: 2.1 10*3/uL (ref 0.7–3.1)
Lymphs: 31 %
MCH: 29.8 pg (ref 26.6–33.0)
MCHC: 34.1 g/dL (ref 31.5–35.7)
MCV: 88 fL (ref 79–97)
MONOS ABS: 0.5 10*3/uL (ref 0.1–0.9)
Monocytes: 7 %
NEUTROS ABS: 3.8 10*3/uL (ref 1.4–7.0)
Neutrophils: 57 %
PLATELETS: 280 10*3/uL (ref 150–379)
RBC: 4.16 x10E6/uL (ref 4.14–5.80)
RDW: 13.2 % (ref 12.3–15.4)
WBC: 6.7 10*3/uL (ref 3.4–10.8)

## 2017-10-13 LAB — BASIC METABOLIC PANEL
BUN / CREAT RATIO: 19 (ref 9–20)
BUN: 16 mg/dL (ref 6–24)
CALCIUM: 9.1 mg/dL (ref 8.7–10.2)
CHLORIDE: 100 mmol/L (ref 96–106)
CO2: 24 mmol/L (ref 20–29)
Creatinine, Ser: 0.85 mg/dL (ref 0.76–1.27)
GFR, EST AFRICAN AMERICAN: 121 mL/min/{1.73_m2} (ref >59)
GFR, EST NON AFRICAN AMERICAN: 104 mL/min/{1.73_m2} (ref >59)
Glucose: 133 mg/dL — ABNORMAL HIGH (ref 65–99)
Potassium: 4.7 mmol/L (ref 3.5–5.2)
Sodium: 138 mmol/L (ref 134–144)

## 2017-10-13 LAB — PROTIME-INR
INR: 1.1 (ref 0.8–1.2)
Prothrombin Time: 11.4 s (ref 9.1–12.0)

## 2017-10-13 NOTE — Patient Instructions (Signed)
Medication Instructions:  Your physician recommends that you continue on your current medications as directed. Please refer to the Current Medication list given to you today.  Labwork: BMET/CBC/PT/INR TODAY   Testing/Procedures: Your physician has requested that you have a cardiac catheterization. Cardiac catheterization is used to diagnose and/or treat various heart conditions. Doctors may recommend this procedure for a number of different reasons. The most common reason is to evaluate chest pain. Chest pain can be a symptom of coronary artery disease (CAD), and cardiac catheterization can show whether plaque is narrowing or blocking your heart's arteries. This procedure is also used to evaluate the valves, as well as measure the blood flow and oxygen levels in different parts of your heart. For further information please visit [email or web address]. Please follow instruction sheet, as given.  Follow-Up: Your physician recommends that you schedule a follow-up appointment in: 3 MONTH OV   Any Other Special Instructions Will Be Listed Below (If Applicable).               [facility] [address] [place] [place] [address] Dept: [phone] Loc: [phone]  Katha CabalMichael T Kohlmann  10/13/2017  You are scheduled for a Cardiac Catheterization on Wednesday, November 7 with Dr. Peter SwazilandJordan.  1. Please arrive at the [facility] at [facility]: [address] [place], [place] [address] at 6:30 AM (two hours before your procedure to ensure your preparation). Free valet parking service is available.   Special note: Every effort is made to have your procedure done on time. Please understand that emergencies sometimes delay scheduled procedures.  2. Diet: Do not eat or drink anything after midnight prior to your procedure except sips of water to take medications.  3. Labs: TO BE DONE  IN OFFICE TODAY   4. Medication instructions in preparation for your procedure:   LEAVE OFF YOUR LOSARTAN DAY OF PROCEDURE  ONLY TAKE 1/2 OF YOUR INSULIN DOSE THE NIGHT BEFORE YOUR CATH AND NONE THE MORNING OF   On the morning of your procedure, take your Aspirin and any morning medicines NOT listed above.  You may use sips of water.  5. Plan for one night stay--bring personal belongings. 6. Bring a current list of your medications and current insurance cards. 7. You MUST have a responsible person to drive you home. 8. Someone MUST be with you the first 24 hours after you arrive home or your discharge will be delayed. 9. Please wear clothes that are easy to get on and off and wear slip-on shoes.  Thank you for allowing us to care for you!   -- [facility]

## 2017-10-17 ENCOUNTER — Telehealth: Payer: Self-pay

## 2017-10-17 NOTE — Telephone Encounter (Signed)
Left detailed message per DPR:  Patient [facility] scheduled for:  10/19/2017 @ 0830 Verified arrival time and place:  NT @ 0630 Confirmed AM meds to be taken pre-cath with sip of water: Take ASA Hold losartan AM of procedure Take 1/2 amount Levemir night prior to procedure Patient must have responsible person to drive home post procedure and observe patient for 24 hours Addl concerns:  This nurse name and # left for call back if any questions

## 2017-10-19 ENCOUNTER — Encounter (HOSPITAL_COMMUNITY)
Admission: RE | Disposition: A | Discharge status: Home / Self Care | Payer: Self-pay | Source: Ambulatory Visit | Attending: Cardiology

## 2017-10-19 ENCOUNTER — Ambulatory Visit (HOSPITAL_COMMUNITY)
Admission: RE | Admit: 2017-10-19 | Discharge: 2017-10-19 | Disposition: A | Discharge status: Home / Self Care | Payer: Medicaid Other | Source: Ambulatory Visit | Attending: Cardiology | Admitting: Cardiology

## 2017-10-19 ENCOUNTER — Encounter (HOSPITAL_COMMUNITY): Payer: Self-pay | Admitting: Cardiology

## 2017-10-19 DIAGNOSIS — K3184 Gastroparesis: Secondary | ICD-10-CM | POA: Diagnosis not present

## 2017-10-19 DIAGNOSIS — E1143 Type 2 diabetes mellitus with diabetic autonomic (poly)neuropathy: Secondary | ICD-10-CM | POA: Insufficient documentation

## 2017-10-19 DIAGNOSIS — Z89422 Acquired absence of other left toe(s): Secondary | ICD-10-CM | POA: Insufficient documentation

## 2017-10-19 DIAGNOSIS — I2582 Chronic total occlusion of coronary artery: Secondary | ICD-10-CM | POA: Diagnosis not present

## 2017-10-19 DIAGNOSIS — I25119 Atherosclerotic heart disease of native coronary artery with unspecified angina pectoris: Secondary | ICD-10-CM | POA: Diagnosis not present

## 2017-10-19 DIAGNOSIS — I2510 Atherosclerotic heart disease of native coronary artery without angina pectoris: Secondary | ICD-10-CM | POA: Diagnosis not present

## 2017-10-19 DIAGNOSIS — E559 Vitamin D deficiency, unspecified: Secondary | ICD-10-CM | POA: Insufficient documentation

## 2017-10-19 DIAGNOSIS — E1142 Type 2 diabetes mellitus with diabetic polyneuropathy: Secondary | ICD-10-CM | POA: Insufficient documentation

## 2017-10-19 DIAGNOSIS — E119 Type 2 diabetes mellitus without complications: Secondary | ICD-10-CM | POA: Diagnosis present

## 2017-10-19 DIAGNOSIS — F418 Other specified anxiety disorders: Secondary | ICD-10-CM | POA: Diagnosis not present

## 2017-10-19 DIAGNOSIS — K219 Gastro-esophageal reflux disease without esophagitis: Secondary | ICD-10-CM | POA: Diagnosis not present

## 2017-10-19 DIAGNOSIS — I2584 Coronary atherosclerosis due to calcified coronary lesion: Secondary | ICD-10-CM | POA: Insufficient documentation

## 2017-10-19 DIAGNOSIS — Z794 Long term (current) use of insulin: Secondary | ICD-10-CM | POA: Insufficient documentation

## 2017-10-19 DIAGNOSIS — E785 Hyperlipidemia, unspecified: Secondary | ICD-10-CM | POA: Insufficient documentation

## 2017-10-19 DIAGNOSIS — Z7982 Long term (current) use of aspirin: Secondary | ICD-10-CM | POA: Diagnosis not present

## 2017-10-19 DIAGNOSIS — I5042 Chronic combined systolic (congestive) and diastolic (congestive) heart failure: Secondary | ICD-10-CM | POA: Diagnosis not present

## 2017-10-19 DIAGNOSIS — I5022 Chronic systolic (congestive) heart failure: Secondary | ICD-10-CM | POA: Diagnosis present

## 2017-10-19 HISTORY — PX: RIGHT/LEFT HEART CATH AND CORONARY ANGIOGRAPHY: CATH118266

## 2017-10-19 LAB — POCT I-STAT 3, VENOUS BLOOD GAS (G3P V)
ACID-BASE EXCESS: 3 mmol/L — AB (ref 0.0–2.0)
BICARBONATE: 28.7 mmol/L — AB (ref 20.0–28.0)
O2 SAT: 76 %
PCO2 VEN: 50.1 mmHg (ref 44.0–60.0)
PO2 VEN: 42 mmHg (ref 32.0–45.0)
TCO2: 30 mmol/L (ref 22–32)
pH, Ven: 7.366 (ref 7.250–7.430)

## 2017-10-19 LAB — GLUCOSE, CAPILLARY
Glucose-Capillary: 182 mg/dL — ABNORMAL HIGH (ref 65–99)
Glucose-Capillary: 156 mg/dL — ABNORMAL HIGH (ref 65–99)

## 2017-10-19 LAB — POCT I-STAT 3, ART BLOOD GAS (G3+)
ACID-BASE EXCESS: 4 mmol/L — AB (ref 0.0–2.0)
BICARBONATE: 29.7 mmol/L — AB (ref 20.0–28.0)
O2 SAT: 99 %
TCO2: 31 mmol/L (ref 22–32)
pCO2 arterial: 45.9 mmHg (ref 32.0–48.0)
pH, Arterial: 7.418 (ref 7.350–7.450)
pO2, Arterial: 128 mmHg — ABNORMAL HIGH (ref 83.0–108.0)

## 2017-10-19 SURGERY — RIGHT/LEFT HEART CATH AND CORONARY ANGIOGRAPHY
Anesthesia: LOCAL

## 2017-10-19 MED ORDER — MIDAZOLAM HCL 2 MG/2ML IJ SOLN
INTRAMUSCULAR | Status: AC
  Filled 2017-10-19: qty 2

## 2017-10-19 MED ORDER — VERAPAMIL HCL 2.5 MG/ML IV SOLN
INTRAVENOUS | Status: DC | PRN
  Administered 2017-10-19: 10 mL via INTRA_ARTERIAL

## 2017-10-19 MED ORDER — FENTANYL CITRATE (PF) 100 MCG/2ML IJ SOLN
INTRAMUSCULAR | Status: DC | PRN
  Administered 2017-10-19 (×2): 25 ug via INTRAVENOUS

## 2017-10-19 MED ORDER — HEPARIN SODIUM (PORCINE) 1000 UNIT/ML IJ SOLN
INTRAMUSCULAR | Status: AC
Start: 2017-10-19 — End: ?
  Filled 2017-10-19: qty 1

## 2017-10-19 MED ORDER — HEPARIN (PORCINE) IN NACL 2-0.9 UNIT/ML-% IJ SOLN
INTRAMUSCULAR | Status: AC | PRN
  Administered 2017-10-19: 1000 mL

## 2017-10-19 MED ORDER — IOPAMIDOL (ISOVUE-370) INJECTION 76%
INTRAVENOUS | Status: AC
  Filled 2017-10-19: qty 100

## 2017-10-19 MED ORDER — SODIUM CHLORIDE 0.9 % WEIGHT BASED INFUSION: 1.0000 mL/kg/h | INTRAVENOUS | Status: AC

## 2017-10-19 MED ORDER — SODIUM CHLORIDE 0.9% FLUSH: 3.0000 mL | Freq: Two times a day (BID) | INTRAVENOUS | Status: DC

## 2017-10-19 MED ORDER — HEPARIN (PORCINE) IN NACL 2-0.9 UNIT/ML-% IJ SOLN
INTRAMUSCULAR | Status: AC
  Filled 2017-10-19: qty 1000

## 2017-10-19 MED ORDER — FENTANYL CITRATE (PF) 100 MCG/2ML IJ SOLN
INTRAMUSCULAR | Status: AC
  Filled 2017-10-19: qty 2

## 2017-10-19 MED ORDER — IOPAMIDOL (ISOVUE-370) INJECTION 76%
INTRAVENOUS | Status: DC | PRN
  Administered 2017-10-19: 100 mL via INTRAVENOUS

## 2017-10-19 MED ORDER — LIDOCAINE HCL (PF) 1 % IJ SOLN
INTRAMUSCULAR | Status: DC | PRN
  Administered 2017-10-19: 1 mL
  Administered 2017-10-19: 2 mL

## 2017-10-19 MED ORDER — SODIUM CHLORIDE 0.9 % WEIGHT BASED INFUSION
3.0000 mL/kg/h | INTRAVENOUS | Status: AC
  Administered 2017-10-19: 3 mL/kg/h via INTRAVENOUS

## 2017-10-19 MED ORDER — SODIUM CHLORIDE 0.9% FLUSH: 3.0000 mL | INTRAVENOUS | Status: DC | PRN

## 2017-10-19 MED ORDER — SODIUM CHLORIDE 0.9 % WEIGHT BASED INFUSION: 1.0000 mL/kg/h | INTRAVENOUS | Status: DC

## 2017-10-19 MED ORDER — MIDAZOLAM HCL 2 MG/2ML IJ SOLN
INTRAMUSCULAR | Status: DC | PRN
  Administered 2017-10-19: 1 mg via INTRAVENOUS

## 2017-10-19 MED ORDER — ASPIRIN 81 MG PO CHEW: 81.0000 mg | CHEWABLE_TABLET | ORAL | Status: DC

## 2017-10-19 MED ORDER — SODIUM CHLORIDE 0.9 % IV SOLN: 250.0000 mL | INTRAVENOUS | Status: DC | PRN

## 2017-10-19 MED ORDER — VERAPAMIL HCL 2.5 MG/ML IV SOLN
INTRAVENOUS | Status: AC
  Filled 2017-10-19: qty 2

## 2017-10-19 MED ORDER — HEPARIN SODIUM (PORCINE) 1000 UNIT/ML IJ SOLN
INTRAMUSCULAR | Status: DC | PRN
  Administered 2017-10-19: 5000 [IU] via INTRAVENOUS

## 2017-10-19 MED ORDER — IOPAMIDOL (ISOVUE-370) INJECTION 76%
INTRAVENOUS | Status: AC
  Filled 2017-10-19: qty 50

## 2017-10-19 MED ORDER — LIDOCAINE HCL (PF) 1 % IJ SOLN
INTRAMUSCULAR | Status: AC
  Filled 2017-10-19: qty 30

## 2017-10-19 SURGICAL SUPPLY — 13 items
CATH BALLN WEDGE 5F 110CM (CATHETERS) ×2 IMPLANT
CATH IMPULSE 5F ANG/FL3.5 (CATHETERS) ×2 IMPLANT
CATH INFINITI 5 FR 3DRC (CATHETERS) ×2 IMPLANT
CATH INFINITI 5FR AL1 (CATHETERS) ×2 IMPLANT
DEVICE RAD COMP TR BAND LRG (VASCULAR PRODUCTS) ×2 IMPLANT
GLIDESHEATH SLEND SS 6F .021 (SHEATH) ×2 IMPLANT
GUIDEWIRE INQWIRE 1.5J.035X260 (WIRE) ×1 IMPLANT
INQWIRE 1.5J .035X260CM (WIRE) ×2
KIT HEART LEFT (KITS) ×2 IMPLANT
PACK CARDIAC CATHETERIZATION (CUSTOM PROCEDURE TRAY) ×2 IMPLANT
SHEATH GLIDE SLENDER 4/5FR (SHEATH) ×2 IMPLANT
TRANSDUCER W/STOPCOCK (MISCELLANEOUS) ×2 IMPLANT
TUBING CIL FLEX 10 FLL-RA (TUBING) ×2 IMPLANT

## 2017-10-19 NOTE — Interval H&P Note (Signed)
History and Physical Interval Note:  10/19/2017 8:21 AM  Travis CabalMichael T [name]  has presented today for surgery, with the diagnosis of acute heart failure  The various methods of treatment have been discussed with the patient and family. After consideration of risks, benefits and other options for treatment, the patient has consented to  Procedure(s): RIGHT/LEFT HEART CATH AND CORONARY ANGIOGRAPHY (N/A) as a surgical intervention .  The patient's history has been reviewed, patient examined, no change in status, stable for surgery.  I have reviewed the patient's chart and labs.  Questions were answered to the patient's satisfaction.   Cath Lab Visit (complete for each Cath Lab visit)  Clinical Evaluation Leading to the Procedure:   ACS: No.  Non-ACS:    Anginal Classification: CCS III  Anti-ischemic medical therapy: Minimal Therapy (1 class of medications)  Non-Invasive Test Results: High-risk stress test findings: cardiac mortality >3%/year  Prior CABG: No previous CABG        Theron AristaPeter [facility] Healthcare[facility]MD,FACC 10/19/2017 8:21 AM

## 2017-10-19 NOTE — Discharge Instructions (Signed)

## 2017-10-19 NOTE — Progress Notes (Signed)
R brachial sheath pulled at 1015. VSS. Pressure held for 8 minutes. Level 0. Gauze/transparent dsg applied. Clean, dry, intact.

## 2017-10-27 NOTE — Progress Notes (Deleted)
   Subjective   Patient ID: Travis [name]    DOB: 09/17/1971, 46 y.o. male   MRN: [id number]  CC: "***"  HPI: Travis [name] is a 46 y.o. male who presents to clinic today for the following:  ***: ***  ***10/17 for anxiety with depression.  Initiated Zoloft 50 mg daily.  Establish care with behavioral health.  Instructed to return in 2 weeks.  Patient also underwent heart MRI and R&LHC showing severe 3 vessel obstructive CAD.  Considering CABG.  ROS: see HPI for pertinent.  PMFSH: CAD (on ASA) and HFrEF (26%, 07/2016), IDDM with gastroparesis, neuropathy, ED, and h/o DKA, h/o osteomyelitis of L-foot, OA with vit-D deficiency. 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 Family history COPD, heart failure, esophogeal cancer (fatehr), fibromyalgia, DM. Smoking status reviewed. Medications reviewed.  Objective   There were no vitals taken for this visit. Vitals and nursing note reviewed.  General: well nourished, well developed, NAD with non-toxic appearance HEENT: normocephalic, atraumatic, moist mucous membranes Neck: supple, non-tender without lymphadenopathy Cardiovascular: regular rate and rhythm without murmurs, rubs, or gallops Lungs: clear to auscultation bilaterally with normal work of breathing Abdomen: soft, non-tender, non-distended, normoactive bowel sounds Skin: warm, dry, no rashes or lesions, cap refill < 2 seconds Extremities: warm and well perfused, normal tone, no edema  Assessment & Plan   No problem-specific Assessment & Plan notes found for this encounter.  No orders of the defined types were placed in this encounter.  No orders of the defined types were placed in this encounter.   Durward Parcelavid McMullen, DO  [facility], PGY-2 10/27/2017, 10:55 AM

## 2017-10-28 ENCOUNTER — Ambulatory Visit: Payer: Medicaid Other | Admitting: Family Medicine

## 2017-10-31 ENCOUNTER — Telehealth: Payer: Self-pay | Admitting: *Deleted

## 2017-10-31 DIAGNOSIS — I2510 Atherosclerotic heart disease of native coronary artery without angina pectoris: Secondary | ICD-10-CM

## 2017-10-31 NOTE — Telephone Encounter (Signed)
Left message to call back  

## 2017-10-31 NOTE — Telephone Encounter (Signed)
-----   Message from Chilton Siiffany [place], MD sent at 10/30/2017 10:35 AM EST ----- Regarding: CT Surgery referral Hi Melinda,  Can we please refer him for CABG with CT surgery?  Thanks, Campbell Soupiffany

## 2017-10-31 NOTE — Telephone Encounter (Signed)
Advised patient should be getting call from surgeons and if he does not by mid next week to call back

## 2017-11-01 ENCOUNTER — Telehealth: Payer: Self-pay | Admitting: Cardiovascular Disease

## 2017-11-01 ENCOUNTER — Other Ambulatory Visit: Payer: Self-pay | Admitting: Neurology

## 2017-11-01 NOTE — Telephone Encounter (Signed)
New message    Patient sister Jacki ConesLaurie calling with questions and concerns about physical activity her brother should avoid leading up to seeing the surgeon. Patient has been asked to work this weekend. He is unsure if the physical demand is too much . Please call

## 2017-11-01 NOTE — Telephone Encounter (Signed)
Advised sister. She will call back if letter needed

## 2017-11-01 NOTE — Telephone Encounter (Signed)
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 Per sister he does not think he can do all of that but feels bad telling them know. Patient wanted to know Dr Duke Salviaandolph thoughts were. Explained to sister patient does not need to push himself but would forward to Dr Duke Salviaandolph for review

## 2017-11-01 NOTE — Telephone Encounter (Signed)
If he doesn't think he can do it then he shouldn't.  We can provide a note that he is to only perform light duties.

## 2017-11-07 ENCOUNTER — Telehealth: Payer: Self-pay | Admitting: Family Medicine

## 2017-11-07 NOTE — Telephone Encounter (Signed)
Pt called and said he needs a refill on his sleep medication. He is completely out of it. Send to [facility] in [place].  Please advise.

## 2017-11-07 NOTE — Telephone Encounter (Signed)
Drinda Buttsnnette, todds aunt, says it is his pain medication that needs to be refilled.  [facility] in [place]

## 2017-11-08 ENCOUNTER — Other Ambulatory Visit: Payer: Self-pay | Admitting: Family Medicine

## 2017-11-08 DIAGNOSIS — E1142 Type 2 diabetes mellitus with diabetic polyneuropathy: Secondary | ICD-10-CM

## 2017-11-08 MED ORDER — GABAPENTIN 800 MG PO TABS: 800.0000 mg | ORAL_TABLET | Freq: Three times a day (TID) | ORAL | 5 refills | Status: DC

## 2017-11-10 ENCOUNTER — Encounter: Payer: Medicaid Other | Admitting: Cardiothoracic Surgery

## 2017-11-10 NOTE — Telephone Encounter (Signed)
Patient has appointment with surgeon's office 11/11/17

## 2017-11-11 ENCOUNTER — Other Ambulatory Visit: Payer: Self-pay

## 2017-11-11 ENCOUNTER — Encounter: Payer: Self-pay | Admitting: Cardiothoracic Surgery

## 2017-11-11 ENCOUNTER — Institutional Professional Consult (permissible substitution) (INDEPENDENT_AMBULATORY_CARE_PROVIDER_SITE_OTHER): Payer: Medicaid Other | Admitting: Cardiothoracic Surgery

## 2017-11-11 VITALS — BP 120/75 | HR 85 | Ht 74.0 in | Wt 225.0 lb

## 2017-11-11 DIAGNOSIS — I25118 Atherosclerotic heart disease of native coronary artery with other forms of angina pectoris: Secondary | ICD-10-CM

## 2017-11-11 DIAGNOSIS — I253 Aneurysm of heart: Secondary | ICD-10-CM

## 2017-11-11 DIAGNOSIS — I2102 ST elevation (STEMI) myocardial infarction involving left anterior descending coronary artery: Secondary | ICD-10-CM

## 2017-11-11 DIAGNOSIS — I2510 Atherosclerotic heart disease of native coronary artery without angina pectoris: Secondary | ICD-10-CM

## 2017-11-11 NOTE — Progress Notes (Signed)
   Subjective   Patient ID: Travis [name]    DOB: 07/28/1971, 46 y.o. male   MRN: [id number]  CC: "Follow-up"  HPI: Travis [name] is a 46 y.o. male who presents to clinic today for the following:  Anxiety and depression: Patient last seen 2 months ago for anxiety depression poorly controlled without use of medications.  Initiated Zoloft 50 mg daily however patient did not take medication due to GI side effects.  He did establish care with behavioral health.  Patient is currently scheduled for a CABG 1 week from today for diffuse CAD following a R&LHC.  He would like to wait until after the procedure to start medication.  He states that his symptoms of anxiety and depression are unchanged from his last visit he denies feelings of SI/HI.  ROS: see HPI for pertinent.  PMFSH: CAD (on ASA) and HFrEF (26%, 07/2016), IDDM with gastroparesis, neuropathy, ED, and h/o DKA, h/o osteomyelitis of L-foot, OA with vit-D deficiency. 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 Family history COPD, heart failure, esophogeal cancer (fatehr), fibromyalgia, DM. Smoking status reviewed. Medications reviewed.  Objective   BP 120/75 (BP Location: Right Arm, Patient Position: Sitting, Cuff Size: Normal)   Pulse 66   Temp 97.7 F (36.5 C) (Oral)   Ht 6\' 2"  (1.88 m)   Wt 225 lb 12.8 oz (102.4 kg)   SpO2 98%   BMI 28.99 kg/m  Vitals and nursing note reviewed.  General: well nourished, well developed, NAD with non-toxic appearance HEENT: normocephalic, atraumatic, moist mucous membranes Cardiovascular: regular rate and rhythm without murmurs, rubs, or gallops Lungs: clear to auscultation bilaterally with normal work of breathing Abdomen: soft, non-tender, non-distended, normoactive bowel sounds Skin: warm, dry, no rashes or lesions, cap refill < 2 seconds Extremities: warm and well perfused, normal tone, no edema Psych:  dysthymic mood, flat affect  Assessment & Plan   Anxiety with depression Chronic.  Poorly controlled and not adherent to Zoloft prescription due to concern for GI side effects.  Current circumstances complicated due to need for urgent CABG scheduled in 1 week.  Symptoms unchanged from previous evaluation without SI/HI.  Though I think patient would benefit from pharmacotherapy, I am in agreement for waiting until completion of surgery. - Given form for temporary handicap - Will hold on Zoloft 50 mg daily until after completion of CABG - Recommending continue seeing BH - RTC 1 month  No orders of the defined types were placed in this encounter.  No orders of the defined types were placed in this encounter.   Travis Parcelavid Rivaldo Hineman, DO [facility], PGY-2 11/15/2017, 12:18 PM

## 2017-11-11 NOTE — Progress Notes (Signed)
PCP is Wendee BeaversMcMullen, David J, DO Referring Provider is Chilton Siandolph, Tiffany, MD  Chief Complaint  Patient presents with  . New Patient (Initial Visit)    Multivessel CAD, Cath 10/19/2017    HPI: Patient examined, most recent coronary angiogram images and cardiac MRI images personally reviewed and counseled with patient and family 46 year old diabetic with ischemic cardiomyopathy and history of previous anterior MI 2-3 years ago presents for evaluation for CABG as recommended by his cardiologist. The patient was found to have significant heart disease last year  when he presented for cholecystectomy and a stress test was abnormal-EF 26%. 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 EF by MRI was 40%. the patient was treated medically with beta blocker, ARB, statin and aspirin. He has little clinical improvement with complaints of fatigue, dyspnea on exertion, occasional orthopnea. He did not have any significant valvular disease on the cardiac MRI. No further symptoms of gallbladder symptoms.  Earlier this month he underwent left right heart catheterization which demonstrated chronic occlusion of the LAD, normal LVEDP without ventriculogram, 80% stenosis of the distal RCA, 75% circumflex stenosis a large OM. Right heart cath showed normal PA pressures, normal CVP, cardiac index greater than 2.5 CABG was recommended. Patient has not had a 2-D echocardiogram. Last chest x-ray is clear.  The patient is a nonsmoker. The patient's diabetes is now under better control on Lantus insulin with A1c of 6.4. He has significant neuropathy and gastroparesis.  Year ago the patient developed necrotic toes of the left foot which required lesion under general anesthesia by Dr. Lajoyce Cornersduda which he tolerated without difficulty.  Patient denies any resting symptoms. 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  He has [facility] primary care with [facility]  Past Medical History:  Diagnosis Date  . Acute on chronic systolic and diastolic heart failure, NYHA class 3 (HCC) 09/14/2017  . Acute osteomyelitis of metatarsal bone of left foot (HCC) 11/12/2015  . CAD in native artery 09/14/2017  . Closed fracture of right tibial plateau 11/11/2015  . Depression with anxiety   . Diabetes mellitus    INSULIN DEPENDENT Type II  . Diabetic peripheral neuropathy associated with type 2 diabetes mellitus (HCC) 08/30/2008   Qualifier: Diagnosis of  By: Daphine DeutscherMartin FNP, Zena AmosNykedtra    . Diabetic ulcer of left foot (HCC) 11/14/2015  . Gastroparesis   . GERD (gastroesophageal reflux disease)   . Osteomyelitis (HCC)   . Osteoporosis 11/12/2015  . Peripheral neuropathy   . Shortness of breath dyspnea    with exertion  . Vitamin D deficiency 11/12/2015    Past Surgical History:  Procedure Laterality Date  . AMPUTATION Left 11/28/2015   Procedure: Left 4th Ray Amputation;  Surgeon: Nadara MustardMarcus Duda V, MD;  [facility];  Service: Orthopedics;  Laterality: Left;  . AMPUTATION Left 10/29/2016   Procedure: LEFT 5TH RAY AMPUTATION;  Surgeon: Nadara MustardMarcus V Duda, MD;  Location: [facility] OR;  Service: Orthopedics;  Laterality: Left;  . ANTERIOR VITRECTOMY Left 04/12/2016   Procedure: ANTERIOR CHAMBER WASH OUT WITH GAS FLUID EXCHANGE;  Surgeon: Carmela RimaNarendra Patel, MD;  [facility];  Service: Ophthalmology;  Laterality: Left;  . CATARACT EXTRACTION Left   . EYE SURGERY     left eye surgery 04/2016  . KNEE SURGERY Left   . RIGHT/LEFT HEART CATH AND CORONARY ANGIOGRAPHY N/A 10/19/2017   Procedure:  RIGHT/LEFT HEART CATH AND CORONARY ANGIOGRAPHY;  Surgeon: Swaziland, [name], MD;  Location: [facility] INVASIVE CV LAB;  Service: Cardiovascular;  Laterality: N/A;  . SHOULDER SURGERY Right   . TOE AMPUTATION Left 11/28/15   4th toe    Family History  Problem Relation Age of Onset  . Cancer Paternal Grandmother   . COPD  Mother   . Heart failure Mother   . Anxiety disorder Mother   . Depression Mother   . Fibromyalgia Mother   . Esophageal cancer Father        Smoker, still does  . Diabetes Sister   . Fibromyalgia Sister   . Diabetes Maternal Uncle   . Diabetes Maternal Grandmother   . GER disease Sister   . Liver disease Paternal Grandfather   . Hemachromatosis Paternal Grandfather   . Diabetes Maternal Grandfather     Social History Social History   Tobacco Use  . Smoking status: Never Smoker  . Smokeless tobacco: Never Used  Substance Use Topics  . Alcohol use: No  . Drug use: No    Current Outpatient Medications  Medication Sig Dispense Refill  . aspirin EC 81 MG tablet Take 81 mg by mouth daily.    Marland Kitchen gabapentin (NEURONTIN) 800 MG tablet Take 1 tablet (800 mg total) by mouth 3 (three) times daily. 90 tablet 5  . insulin detemir (LEVEMIR) 100 UNIT/ML injection Inject 0.2 mLs (20 Units total) into the skin at bedtime. 30 mL 3  . losartan (COZAAR) 25 MG tablet 1/2 tablet by mouth daily (Patient taking differently: Take 12.5 mg daily by mouth. 1/2 tablet by mouth daily) 15 tablet 5  . metoCLOPramide (REGLAN) 10 MG tablet Take 1 tablet (10 mg total) by mouth 4 (four) times daily -  before meals and at bedtime. 90 tablet 1  . metoprolol succinate (TOPROL XL) 25 MG 24 hr tablet Take 1 tablet (25 mg total) by mouth daily. 30 tablet 5  . ranitidine (ZANTAC) 150 MG tablet Take 1 tablet 2 (two) times daily by mouth.  12  . rosuvastatin (CRESTOR) 40 MG tablet Take 1 tablet (40 mg total) by mouth daily. 30 tablet 5   No current facility-administered medications for this visit.     Allergies  Allergen Reactions  . No Known Allergies     Review of Systems        Review of Systems :  [ y ] = yes, [  ] = no        General :  Weight gain [   ]    Weight loss  [   ]  Fatigue [ y ]  Fever [  ]  Chills  [  ]                                Weakness  [  ]           HEENT    Headache [  ]  Dizziness  [  ]  Blurred vision [  ] Glaucoma  [  ]                          Nosebleeds [  ] Painful or loose teeth [  ]        Cardiac :  Chest pain/ pressure [  ]  Resting SOB [  ] exertional SOB [ y ]  Orthopnea [ y ]  Pedal edema  [  ]  Palpitations [  ] Syncope/presyncope [ ]                         Paroxysmal nocturnal dyspnea [  ]         Pulmonary : cough [  ]  wheezing [  ]  Hemoptysis [  ] Sputum [  ] Snoring [  ]                              Pneumothorax [  ]  Sleep apnea [  ]        GI : Vomiting [  ]  Dysphagia [  ]  Melena  [  ]  Abdominal pain [  ] BRBPR [  ]              Heart burn [  ]  Constipation [  ] Diarrhea  [  ] Colonoscopy [   ]        GU : Hematuria [  ]  Dysuria [  ]  Nocturia [  ] UTI's [  ]        Vascular : Claudication [  ]  Rest pain [  ]  DVT [  ] Vein stripping [  ] leg ulcers [  ]                          TIA [  ] Stroke [  ]  Varicose veins [  ]        NEURO :  Headaches  [  ] Seizures [  ] Vision changes Cove.Etienne[y  ] Paresthesias [  ]                                       Seizures [  ]        Musculoskeletal :  Arthritis [y] Gout  [  ]  Back pain [  ]  Joint pain [  ]        Skin :  Rash [  ]  Melanoma [  ] Sores [  ]        Heme : Bleeding problems [  ]Clotting Disorders [  ] Anemia [  ]Blood Transfusion [ ]         Endocrine : Diabetes [ y] Heat or Cold intolerance [  ] Polyuria [  ]excessive thirst [ ]  neuropathy-numb feet        Psych : Depression [  ]  Anxiety [  ]  Psych hospitalizations [  ] Memory change [  ]                                               BP 120/75   Pulse 85   Ht 6\' 2"  (1.88 m)   Wt 225 lb (102.1 kg)   SpO2 99%   BMI 28.89 kg/m  Physical Exam     Physical Exam  General: Chronically ill-appearing middle-aged male no acute distress HEENT: Normocephalic pupils equal , dentition suboptimal Neck: Supple without JVD, adenopathy, or bruit Chest: Clear to  auscultation, symmetrical breath sounds, no rhonchi, no  tenderness             or deformity Cardiovascular: Regular rate and rhythm, no murmur, no gallop, peripheral pulses             palpable in all extremities Abdomen:  Soft, nontender, no palpable mass or organomegaly Extremities: Warm, well-perfused, no clubbing cyanosis edema or tenderness,              no venous stasis changes of the legs Rectal/GU: Deferred Neuro: Grossly non--focal and symmetrical throughout Skin: Clean and dry without rash or ulceration   Diagnostic Tests: Severe three-vessel CAD with reduced LV function and preserved right heart pressures and cardiac output. 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 Mild MR by cardiac MRI but echocardiogram is pending.  Impression: 46 year old diabetic with severe three-vessel CAD and reduced LV function. He has exertional symptoms of shortness of breath exercise tolerance. 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 He would benefit from multivessel CABG with possible repair of LV apical aneurysm.  Plan:Patient be scheduled for surgery on December 11 at [facility] hospital. I discussed the details of the surgery with the patient and his sister including the expected benefits, alternatives, expected recovery, and potential risks. He demonstrates his understanding and agrees to proceed with surgery.   [name], MD [facility] [phone]

## 2017-11-15 ENCOUNTER — Ambulatory Visit: Payer: Medicaid Other | Admitting: Family Medicine

## 2017-11-15 ENCOUNTER — Other Ambulatory Visit: Payer: Self-pay

## 2017-11-15 ENCOUNTER — Encounter: Payer: Self-pay | Admitting: Family Medicine

## 2017-11-15 VITALS — BP 120/75 | HR 66 | Temp 97.7°F | Ht 74.0 in | Wt 225.8 lb

## 2017-11-15 DIAGNOSIS — F418 Other specified anxiety disorders: Secondary | ICD-10-CM

## 2017-11-15 NOTE — Assessment & Plan Note (Addendum)
Chronic.  Poorly controlled and not adherent to Zoloft prescription due to concern for GI side effects.  Current circumstances complicated due to need for urgent CABG scheduled in 1 week.  Symptoms unchanged from previous evaluation without SI/HI.  Though I think patient would benefit from pharmacotherapy, I am in agreement for waiting until completion of surgery. - Given form for temporary handicap - Will hold on Zoloft 50 mg daily until after completion of CABG - Recommending continue seeing BH - RTC 1 month

## 2017-11-15 NOTE — Patient Instructions (Signed)
Thank you for coming in to see us today. Please see below to review our plan for today's visit.  We will wait on starting medication for your anxiety and depression until after the procedure.  GI side effects are the most common for the medication I prescribed but I feel the benefits outweigh the risks.  If you do experience side effects when we start this medication, we can discontinue it and try a different medication.  I would like you to continue following up with behavioral health in the meantime.  If you have any questions or concerns in the meantime, please let us know.  I wish you the best of luck with your procedure and hope your recovery is quick.  Please call the clinic at [phone] if your symptoms worsen or you have any concerns. It was our pleasure to serve you.  Durward Parcelavid McMullen, DO [facility], PGY-2

## 2017-11-17 ENCOUNTER — Ambulatory Visit (HOSPITAL_COMMUNITY)
Admission: RE | Admit: 2017-11-17 | Discharge: 2017-11-17 | Disposition: A | Discharge status: Home / Self Care | Payer: Medicaid Other | Source: Ambulatory Visit | Attending: Cardiothoracic Surgery | Admitting: Cardiothoracic Surgery

## 2017-11-17 DIAGNOSIS — I420 Dilated cardiomyopathy: Secondary | ICD-10-CM | POA: Insufficient documentation

## 2017-11-17 DIAGNOSIS — I253 Aneurysm of heart: Secondary | ICD-10-CM | POA: Diagnosis not present

## 2017-11-17 DIAGNOSIS — I2510 Atherosclerotic heart disease of native coronary artery without angina pectoris: Secondary | ICD-10-CM

## 2017-11-17 NOTE — Progress Notes (Signed)
  Echocardiogram 2D Echocardiogram has been performed.  Fronia Depass L Androw 11/17/2017, 10:00 AM

## 2017-11-18 ENCOUNTER — Encounter (HOSPITAL_COMMUNITY): Payer: Self-pay

## 2017-11-18 ENCOUNTER — Encounter (HOSPITAL_COMMUNITY): Payer: Medicaid Other

## 2017-11-18 ENCOUNTER — Ambulatory Visit (HOSPITAL_COMMUNITY)
Admission: RE | Admit: 2017-11-18 | Discharge: 2017-11-18 | Disposition: A | Discharge status: Home / Self Care | Payer: Medicaid Other | Source: Ambulatory Visit | Attending: Cardiothoracic Surgery | Admitting: Cardiothoracic Surgery

## 2017-11-18 ENCOUNTER — Other Ambulatory Visit: Payer: Self-pay

## 2017-11-18 ENCOUNTER — Encounter (HOSPITAL_COMMUNITY)
Admission: RE | Admit: 2017-11-18 | Discharge: 2017-11-18 | Disposition: A | Discharge status: Home / Self Care | Payer: Medicaid Other | Source: Ambulatory Visit | Attending: Cardiothoracic Surgery | Admitting: Cardiothoracic Surgery

## 2017-11-18 DIAGNOSIS — I5042 Chronic combined systolic (congestive) and diastolic (congestive) heart failure: Secondary | ICD-10-CM | POA: Diagnosis not present

## 2017-11-18 DIAGNOSIS — I2510 Atherosclerotic heart disease of native coronary artery without angina pectoris: Secondary | ICD-10-CM

## 2017-11-18 DIAGNOSIS — Z01810 Encounter for preprocedural cardiovascular examination: Secondary | ICD-10-CM | POA: Diagnosis present

## 2017-11-18 DIAGNOSIS — E1140 Type 2 diabetes mellitus with diabetic neuropathy, unspecified: Secondary | ICD-10-CM | POA: Diagnosis not present

## 2017-11-18 DIAGNOSIS — I255 Ischemic cardiomyopathy: Secondary | ICD-10-CM | POA: Insufficient documentation

## 2017-11-18 DIAGNOSIS — Z01818 Encounter for other preprocedural examination: Secondary | ICD-10-CM | POA: Diagnosis present

## 2017-11-18 DIAGNOSIS — I252 Old myocardial infarction: Secondary | ICD-10-CM | POA: Diagnosis not present

## 2017-11-18 DIAGNOSIS — Z79899 Other long term (current) drug therapy: Secondary | ICD-10-CM | POA: Insufficient documentation

## 2017-11-18 DIAGNOSIS — I253 Aneurysm of heart: Secondary | ICD-10-CM

## 2017-11-18 DIAGNOSIS — K219 Gastro-esophageal reflux disease without esophagitis: Secondary | ICD-10-CM | POA: Insufficient documentation

## 2017-11-18 DIAGNOSIS — Z7982 Long term (current) use of aspirin: Secondary | ICD-10-CM | POA: Diagnosis not present

## 2017-11-18 DIAGNOSIS — Z01812 Encounter for preprocedural laboratory examination: Secondary | ICD-10-CM | POA: Insufficient documentation

## 2017-11-18 DIAGNOSIS — I110 Hypertensive heart disease with heart failure: Secondary | ICD-10-CM | POA: Insufficient documentation

## 2017-11-18 DIAGNOSIS — Z794 Long term (current) use of insulin: Secondary | ICD-10-CM | POA: Insufficient documentation

## 2017-11-18 DIAGNOSIS — F419 Anxiety disorder, unspecified: Secondary | ICD-10-CM | POA: Diagnosis not present

## 2017-11-18 DIAGNOSIS — M810 Age-related osteoporosis without current pathological fracture: Secondary | ICD-10-CM | POA: Diagnosis not present

## 2017-11-18 DIAGNOSIS — F329 Major depressive disorder, single episode, unspecified: Secondary | ICD-10-CM | POA: Insufficient documentation

## 2017-11-18 HISTORY — DX: Essential (primary) hypertension: I10

## 2017-11-18 HISTORY — DX: Aneurysm of heart: I25.3

## 2017-11-18 LAB — BLOOD GAS, ARTERIAL
Acid-base deficit: 0.7 mmol/L (ref 0.0–2.0)
Bicarbonate: 23.7 mmol/L (ref 20.0–28.0)
Drawn by: 449841
FIO2: 21
O2 Saturation: 98 %
Patient temperature: 98.6
pCO2 arterial: 41.1 mmHg (ref 32.0–48.0)
pH, Arterial: 7.379 (ref 7.350–7.450)
pO2, Arterial: 114 mmHg — ABNORMAL HIGH (ref 83.0–108.0)

## 2017-11-18 LAB — COMPREHENSIVE METABOLIC PANEL
ALT: 25 U/L (ref 17–63)
AST: 23 U/L (ref 15–41)
Albumin: 4 g/dL (ref 3.5–5.0)
Alkaline Phosphatase: 64 U/L (ref 38–126)
Anion gap: 9 (ref 5–15)
BUN: 18 mg/dL (ref 6–20)
CO2: 22 mmol/L (ref 22–32)
Calcium: 8.8 mg/dL — ABNORMAL LOW (ref 8.9–10.3)
Chloride: 105 mmol/L (ref 101–111)
Creatinine, Ser: 0.84 mg/dL (ref 0.61–1.24)
GFR calc Af Amer: >60 mL/min (ref >60)
GFR calc non Af Amer: >60 mL/min (ref >60)
Glucose, Bld: 187 mg/dL — ABNORMAL HIGH (ref 65–99)
Potassium: 4.1 mmol/L (ref 3.5–5.1)
Sodium: 136 mmol/L (ref 135–145)
Total Bilirubin: 0.6 mg/dL (ref 0.3–1.2)
Total Protein: 6.9 g/dL (ref 6.5–8.1)

## 2017-11-18 LAB — PULMONARY FUNCTION TEST
DL/VA % pred: 87 %
DL/VA: 4.27 ml/min/mmHg/L
DLCO unc % pred: 71 %
DLCO unc: 27.15 ml/min/mmHg
FEF 25-75 Post: 4.53 L/sec
FEF 25-75 Pre: 4.04 L/sec
FEF2575-%Change-Post: 12 %
FEF2575-%Pred-Post: 112 %
FEF2575-%Pred-Pre: 100 %
FEV1-%Change-Post: 7 %
FEV1-%Pred-Post: 90 %
FEV1-%Pred-Pre: 84 %
FEV1-Post: 4.13 L
FEV1-Pre: 3.86 L
FEV1FVC-%Change-Post: -1 %
FEV1FVC-%Pred-Pre: 103 %
FEV6-%Change-Post: 8 %
FEV6-%Pred-Post: 91 %
FEV6-%Pred-Pre: 84 %
FEV6-Post: 5.17 L
FEV6-Pre: 4.76 L
FEV6FVC-%Pred-Post: 103 %
FEV6FVC-%Pred-Pre: 103 %
FVC-%Change-Post: 8 %
FVC-%Pred-Post: 88 %
FVC-%Pred-Pre: 81 %
FVC-Post: 5.17 L
FVC-Pre: 4.76 L
Post FEV1/FVC ratio: 80 %
Post FEV6/FVC ratio: 100 %
Pre FEV1/FVC ratio: 81 %
Pre FEV6/FVC Ratio: 100 %
RV % pred: 85 %
RV: 1.87 L
TLC % pred: 88 %
TLC: 6.87 L

## 2017-11-18 LAB — GLUCOSE, CAPILLARY: Glucose-Capillary: 143 mg/dL — ABNORMAL HIGH (ref 65–99)

## 2017-11-18 LAB — CBC
HCT: 38.4 % — ABNORMAL LOW (ref 39.0–52.0)
Hemoglobin: 13 g/dL (ref 13.0–17.0)
MCH: 29.1 pg (ref 26.0–34.0)
MCHC: 33.9 g/dL (ref 30.0–36.0)
MCV: 86.1 fL (ref 78.0–100.0)
Platelets: 235 10*3/uL (ref 150–400)
RBC: 4.46 MIL/uL (ref 4.22–5.81)
RDW: 13.2 % (ref 11.5–15.5)
WBC: 8.9 10*3/uL (ref 4.0–10.5)

## 2017-11-18 LAB — SURGICAL PCR SCREEN
MRSA, PCR: NEGATIVE
Staphylococcus aureus: NEGATIVE

## 2017-11-18 LAB — PROTIME-INR
INR: 1.07
Prothrombin Time: 13.8 seconds (ref 11.4–15.2)

## 2017-11-18 LAB — APTT: aPTT: 33 seconds (ref 24–36)

## 2017-11-18 LAB — ABO/RH: ABO/RH(D): A NEG
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
## 2017-11-18 MED ORDER — ALBUTEROL SULFATE (2.5 MG/3ML) 0.083% IN NEBU
2.5000 mg | INHALATION_SOLUTION | Freq: Once | RESPIRATORY_TRACT | Status: AC
  Administered 2017-11-18: 2.5 mg via RESPIRATORY_TRACT

## 2017-11-18 NOTE — Pre-Procedure Instructions (Signed)
Katha CabalMichael T Luera  11/18/2017      [facility] [id number] - [place], [place] - [address] [place] [address] Phone: [phone] Fax: [phone]  [id number] - [place], [place] - [address] [place] [address] Phone: [phone] Fax: [phone]    Your procedure is scheduled on Tuesday, November 22, 2017  [facility]A" at 7:20AM   Call this number if you have problems the morning of surgery:  [phone]   Remember:  Do not eat food or drink liquids after midnight.  Take these medicines the morning of surgery with A SIP OF WATER: Gabapentin (NEURONTIN), Metoprolol succinate (TOPROL XL), and Ranitidine (ZANTAC). If needed MetoCLOPramide (REGLAN) for nausea.   Follow your doctor's instruction regarding Aspirin.  As of today, stop taking all Aspirin products, Vitamins, Fish oils, and Herbal medications. Also stop all NSAIDS i.e. Advil, Ibuprofen, Motrin, Aleve, Anaprox, Naproxen, BC and Goody Powders.  How to Manage Your Diabetes Before and After Surgery  THE NIGHT BEFORE SURGERY, take ___10________ units of ____Levemir_______insulin.  Why is it important to control my blood sugar before and after surgery? . Improving blood sugar levels before and after surgery helps healing and can limit problems. . A way of improving blood sugar control is eating a healthy diet by: o  Eating less sugar and carbohydrates o  Increasing activity/exercise o  Talking with your doctor about reaching your blood sugar goals . High blood sugars (greater than 180 mg/dL) can raise your risk of infections and slow your recovery, so you will need to focus on controlling your diabetes during the weeks before surgery. . Make sure that the doctor who takes care of your diabetes knows about your planned surgery including the  date and location.  How do I manage my blood sugar before surgery? . Check your blood sugar at least 4 times a day, starting 2 days before surgery, to make sure that the level is not too high or low. o Check your blood sugar the morning of your surgery when you wake up and every 2 hours until you get to the Short Stay unit. . If your blood sugar is less than 70 mg/dL, you will need to treat for low blood sugar: o Do not take insulin. o Treat a low blood sugar (less than 70 mg/dL) with  cup of clear juice (cranberry or apple), 4 glucose tablets, OR glucose gel. Recheck blood sugar in 15 minutes after treatment (to make sure it is greater than 70 mg/dL). If your blood sugar is not greater than 70 mg/dL on recheck, call [phone] o  for further instructions. . Report your blood sugar to the short stay nurse when you get to Short Stay.  . If you are admitted to the hospital after surgery: o Your blood sugar will be checked by the staff and you will probably be given insulin after surgery (instead of oral diabetes medicines) to make sure you have good blood sugar levels. o The goal for blood sugar control after surgery is 80-180 mg/dL.  . If your CBG is greater than 220 mg/dL, call us at [phone]   Do not wear jewelry.  Do not wear lotions, powders, colognes, or deodorant.  Do not shave 48 hours prior to surgery.  Men may shave face and  neck.  Do not bring valuables to the hospital.  [facility] is not responsible for any belongings or valuables.  Contacts, dentures or bridgework may not be worn into surgery.  Leave your suitcase in the car.  After surgery it may be brought to your room.  For patients admitted to the hospital, discharge time will be determined by your treatment team.  Patients discharged the day of surgery will not be allowed to drive home.   Special instructions:   [place]- Preparing For Surgery  Before surgery, you can play an important role. Because skin is  not sterile, your skin needs to be as free of germs as possible. You can reduce the number of germs on your skin by washing with CHG (chlorahexidine gluconate) Soap before surgery.  CHG is an antiseptic cleaner which kills germs and bonds with the skin to continue killing germs even after washing.  Please do not use if you have an allergy to CHG or antibacterial soaps. If your skin becomes reddened/irritated stop using the CHG.  Do not shave (including legs and underarms) for at least 48 hours prior to first CHG shower. It is OK to shave your face.  Please follow these instructions carefully.   1. Shower the NIGHT BEFORE SURGERY and the MORNING OF SURGERY with CHG.   2. If you chose to wash your hair, wash your hair first as usual with your normal shampoo.  3. After you shampoo, rinse your hair and body thoroughly to remove the shampoo.  4. Use CHG as you would any other liquid soap. You can apply CHG directly to the skin and wash gently with a scrungie or a clean washcloth.   5. Apply the CHG Soap to your body ONLY FROM THE NECK DOWN.  Do not use on open wounds or open sores. Avoid contact with your eyes, ears, mouth and genitals (private parts). Wash Face and genitals (private parts)  with your normal soap.  6. Wash thoroughly, paying special attention to the area where your surgery will be performed.  7. Thoroughly rinse your body with warm water from the neck down.  8. DO NOT shower/wash with your normal soap after using and rinsing off the CHG Soap.  9. Pat yourself dry with a CLEAN TOWEL.  10. Wear CLEAN PAJAMAS to bed the night before surgery, wear comfortable clothes the morning of surgery  11. Place CLEAN SHEETS on your bed the night of your first shower and DO NOT SLEEP WITH PETS.  Day of Surgery: Do not apply any deodorants/lotions. Please wear clean clothes to the hospital/surgery center.    Please read over the following fact sheets that you were given. Pain Booklet,  Coughing and Deep Breathing, Blood Transfusion Information, MRSA Information and Surgical Site Infection Prevention

## 2017-11-18 NOTE — Progress Notes (Signed)
Pre-op Cardiac Surgery  Carotid Findings:  Bilateral 1-39% ICA stenosis, antegrade vertebral flow.   Upper Extremity Right Left  Brachial Pressures 119, Tri 127, Tri  Radial Waveforms Tri Tri  Ulnar Waveforms Tri Tri  Palmar Arch (Allen's Test) Waveform is unchanged with radial compression and decreases greater than 50% with ulnar compression.  Waveform increases greater than 50% with radial compression and nearly obliterates with ulnar compression.     Lower  Extremity Right Left  Dorsalis Pedis 255, Tri 255, Bi  Posterior Tibial 255, Bi 163, Bi- questionable finding  Ankle/Brachial Indices  non compressible     Findings:  Bilateral lower extremity arteries are non compressible. ABi's are unreliable.  Travis [name]- RDMS, RVT 1:02 PM  11/18/2017

## 2017-11-18 NOTE — Progress Notes (Signed)
PCP - Dr. Durward Parcelavid McMullen  Cardiologist - Dr. Chilton Siiffany   Chest x-ray - 11/18/17  EKG - 11/18/17  Stress Test - 08/09/16 (E)  ECHO - 11/17/17 (E)  PFT- 11/18/17 (E)  Cardiac Cath - 10/19/17 (E)  Sleep Study - No CPAP - None  LABS- Pending: CBC, CMP, ABG, PT, PTT, T/S, UA-DOS  HA1C- 09/16/17 6.3 Fasting Blood Sugar - 110-120, Today 143 Checks Blood Sugar ___1__ times a day  Pt sts he is to continue taking Aspirin, per the surgeon. Chart will be given to anesthesia for review due to cardiac history.  Pt denies having chest pain, sob, or fever at this time. All instructions explained to the pt, with a verbal understanding of the material. Pt agrees to go over the instructions while at home for a better understanding. The opportunity to ask questions was provided.

## 2017-11-19 LAB — VAS US DOPPLER PRE CABG
LEFT ECA DIAS: -16 cm/s
LEFT VERTEBRAL DIAS: -14 cm/s
Left CCA dist dias: -26 cm/s
Left CCA dist sys: -80 cm/s
Left CCA prox dias: 32 cm/s
Left CCA prox sys: 159 cm/s
Left ICA dist dias: -34 cm/s
Left ICA dist sys: -96 cm/s
Left ICA prox dias: -26 cm/s
Left ICA prox sys: -66 cm/s
RIGHT ECA DIAS: -13 cm/s
RIGHT VERTEBRAL DIAS: -16 cm/s
Right CCA prox dias: 16 cm/s
Right CCA prox sys: 161 cm/s
Right cca dist sys: -88 cm/s

## 2017-11-19 IMAGING — CR DG CHEST 1V PORT
1 series · 1 of 1 positions shown · non-contrast
Comparison: CT of the chest performed 09/19/2009

CLINICAL DATA: Preoperative chest radiograph.  Initial encounter.

EXAM:
PORTABLE CHEST 1 VIEW

[AP]
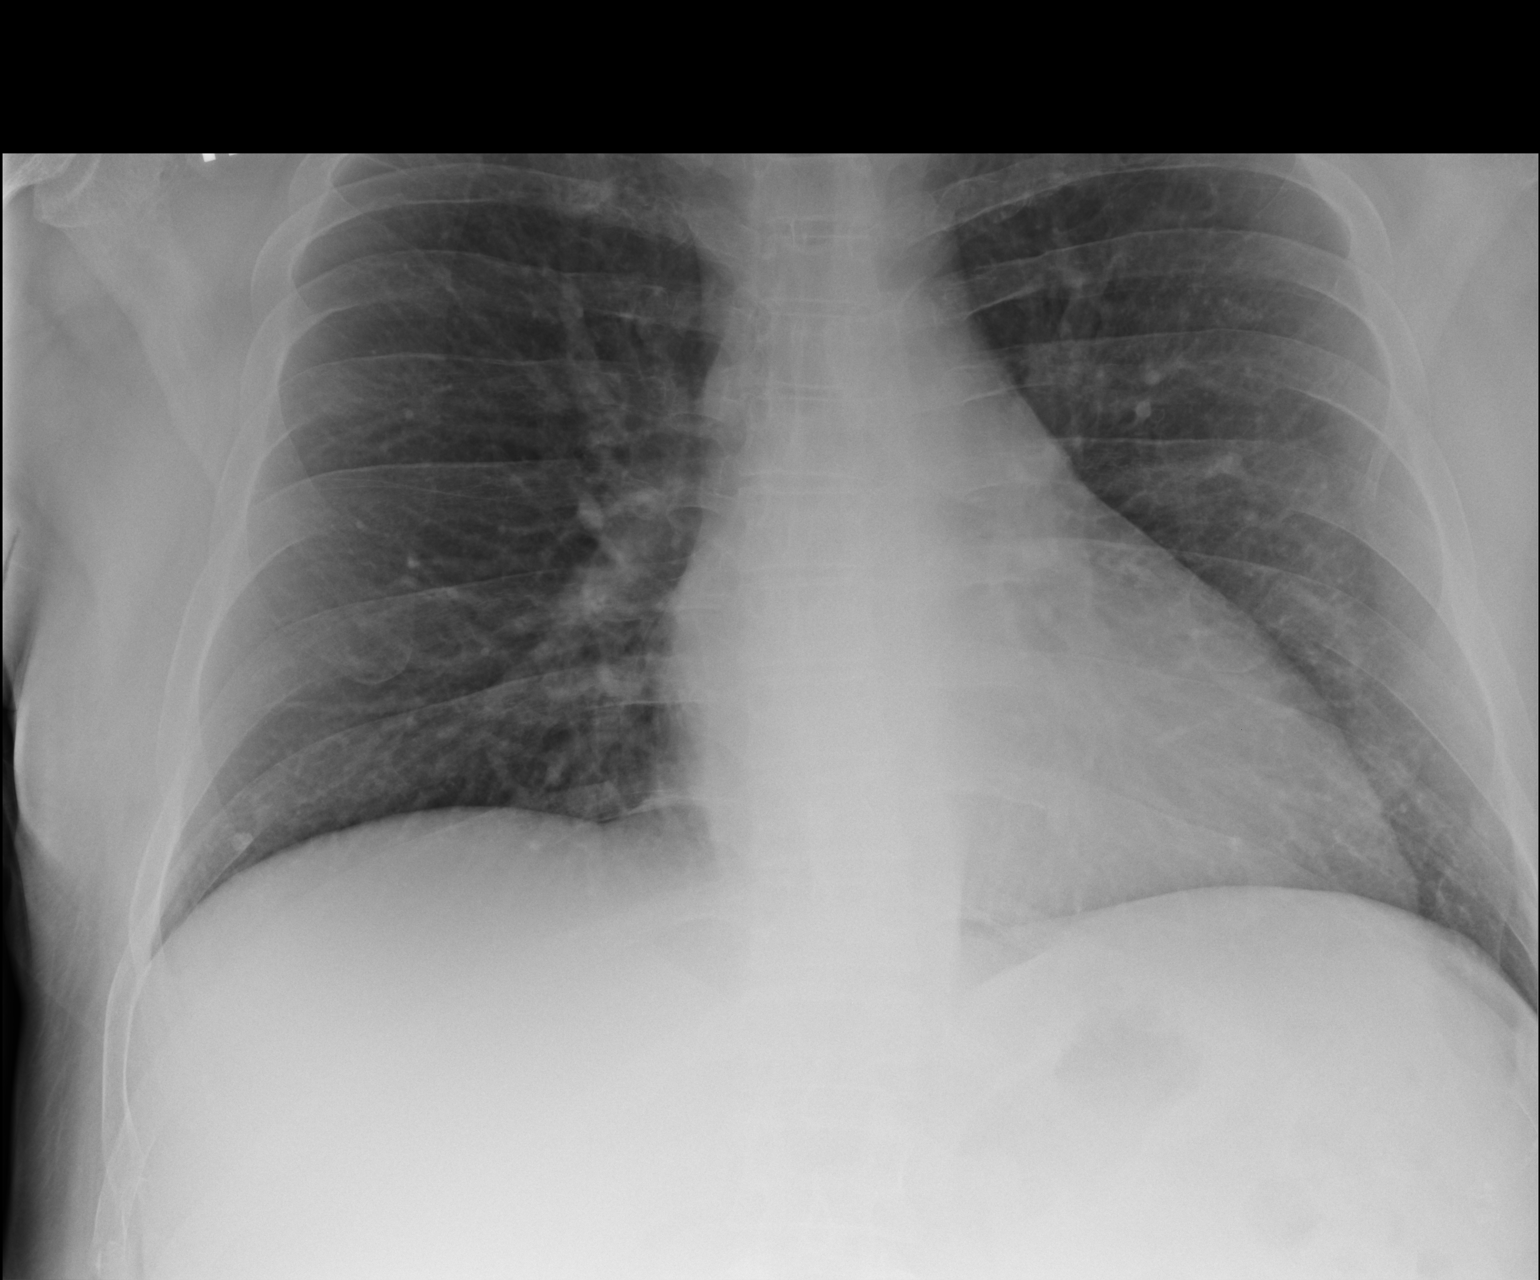

[1 of 1 positions shown; findings below may reference images not displayed]

FINDINGS: The lungs are well-aerated and clear. There is no evidence of focal
opacification, pleural effusion or pneumothorax. A calcified
granuloma is noted at the right lung base.

The cardiomediastinal silhouette is borderline normal in size. No
acute osseous abnormalities are seen.
IMPRESSION: No acute cardiopulmonary process seen.

## 2017-11-21 MED ORDER — DEXTROSE 5 % IV SOLN
750.0000 mg | INTRAVENOUS | Status: AC
  Filled 2017-11-21: qty 750

## 2017-11-21 MED ORDER — MAGNESIUM SULFATE 50 % IJ SOLN
40.0000 meq | INTRAMUSCULAR | Status: AC
  Filled 2017-11-21: qty 9.85

## 2017-11-21 MED ORDER — SODIUM CHLORIDE 0.9 % IV SOLN
INTRAVENOUS | Status: AC
  Filled 2017-11-21: qty 1

## 2017-11-21 MED ORDER — METOPROLOL TARTRATE 12.5 MG HALF TABLET: 12.5000 mg | ORAL_TABLET | Freq: Once | ORAL | Status: DC

## 2017-11-21 MED ORDER — DOPAMINE-DEXTROSE 3.2-5 MG/ML-% IV SOLN
0.0000 ug/kg/min | INTRAVENOUS | Status: AC
  Filled 2017-11-21: qty 250

## 2017-11-21 MED ORDER — TRANEXAMIC ACID 1000 MG/10ML IV SOLN
1.5000 mg/kg/h | INTRAVENOUS | Status: AC
  Filled 2017-11-21: qty 25

## 2017-11-21 MED ORDER — DEXMEDETOMIDINE HCL IN NACL 400 MCG/100ML IV SOLN
0.1000 ug/kg/h | INTRAVENOUS | Status: AC
  Filled 2017-11-21: qty 100

## 2017-11-21 MED ORDER — TRANEXAMIC ACID (OHS) PUMP PRIME SOLUTION
2.0000 mg/kg | INTRAVENOUS | Status: AC
  Filled 2017-11-21: qty 2.03

## 2017-11-21 MED ORDER — EPINEPHRINE PF 1 MG/ML IJ SOLN
0.0000 ug/min | INTRAMUSCULAR | Status: AC
  Filled 2017-11-21: qty 4

## 2017-11-21 MED ORDER — DEXTROSE 5 % IV SOLN
1.5000 g | INTRAVENOUS | Status: AC
  Filled 2017-11-21: qty 1.5

## 2017-11-21 MED ORDER — SODIUM CHLORIDE 0.9 % IV SOLN
30.0000 ug/min | INTRAVENOUS | Status: AC
  Filled 2017-11-21: qty 2

## 2017-11-21 MED ORDER — TRANEXAMIC ACID (OHS) BOLUS VIA INFUSION
15.0000 mg/kg | INTRAVENOUS | Status: AC
  Filled 2017-11-21: qty 1523

## 2017-11-21 MED ORDER — POTASSIUM CHLORIDE 2 MEQ/ML IV SOLN
80.0000 meq | INTRAVENOUS | Status: AC
  Filled 2017-11-21: qty 40

## 2017-11-21 MED ORDER — CHLORHEXIDINE GLUCONATE 0.12 % MT SOLN
15.0000 mL | Freq: Once | OROMUCOSAL | Status: AC
  Administered 2017-11-29: 15 mL via OROMUCOSAL
  Filled 2017-11-21: qty 15

## 2017-11-21 MED ORDER — SODIUM CHLORIDE 0.9 % IV SOLN
INTRAVENOUS | Status: AC
  Filled 2017-11-21: qty 30

## 2017-11-21 MED ORDER — PLASMA-LYTE 148 IV SOLN
INTRAVENOUS | Status: AC
  Filled 2017-11-21: qty 2.5

## 2017-11-21 MED ORDER — NITROGLYCERIN IN D5W 200-5 MCG/ML-% IV SOLN
2.0000 ug/min | INTRAVENOUS | Status: AC
  Filled 2017-11-21: qty 250

## 2017-11-21 MED ORDER — VANCOMYCIN HCL 10 G IV SOLR
1500.0000 mg | INTRAVENOUS | Status: AC
  Filled 2017-11-21: qty 1500

## 2017-11-21 MED ORDER — MILRINONE LACTATE IN DEXTROSE 20-5 MG/100ML-% IV SOLN
0.1250 ug/kg/min | INTRAVENOUS | Status: AC
  Filled 2017-11-21: qty 100

## 2017-11-21 NOTE — Progress Notes (Signed)
Anesthesia Chart Review: [name] scheduled for CABG on 11/22/2017 by Dr. Kerin PernaPeter Van [name].  - PCP is listed as Dr. Durward Parcelavid [name]. - Cardiologist is Dr. Chilton Siiffany [place] [name].   PMH includes never smoker, DM2 (IDDM) with peripheral neuropathy and gastroparesis, CAD, anterior MI, ischemic cardiomyopathy, LV apical aneurysm, chronic diastolic and systolic CHF, HTN, depression, anxiety, GERD, osteoporosis, diabetic foot ulcers/osteomyelitis, (s/p left 4th ray amputation 11/28/15, left 5th toe amputation 10/29/16), cholelithiasis.   Medications include: ASA 81 mg, Neurontin, Levemir, losartan, Reglan, Toprol XL, Zantac, Crestor.   BP 135/60   Pulse 76   Temp 36.5 C   Resp 20   Ht 6\' 2"  (1.88 m)   Wt 223 lb 12.8 oz (101.5 kg)   SpO2 100%   BMI 28.73 kg/m   EKG 11/18/17: Normal sinus rhythm, low voltage QRS, anterolateral infarct (old).  Echo 11/17/17: Study Conclusions - Left ventricle: Inferior and apical hypokinesis. The cavity size   was normal. Systolic function was moderately reduced. The   estimated ejection fraction was in the range of 35% to 40%. Wall   motion was normal; there were no regional wall motion   abnormalities. Left ventricular diastolic function parameters   were normal. - Left atrium: The atrium was mildly dilated. - Atrial septum: No defect or patent foramen ovale was identified.  Cardiac cath 10/29/17:  LV end diastolic pressure is normal.  LV end diastolic pressure is normal.  Ost 1st Mrg to 1st Mrg lesion is 65% stenosed.  Ost LAD to Prox LAD lesion is 100% stenosed.  Prox RCA lesion is 50% stenosed.  Mid RCA lesion is 60% stenosed.  Dist RCA lesion is 80% stenosed.  Ost RPDA to RPDA lesion is 70% stenosed. 1. Severe 3 vessel obstructive CAD.    - 100% proximal LAD with left to left and right to left collaterals.    - 60-70% very large OM1    - 60% mid RCA, Long 80% distal RCA extending down into the PDA 2. Normal LVEDP 3.  Normal right heart pressures and cardiac output. Plan: patient has complex, multivessel CAD with LV dysfunction. Should consider revascularization with CABG in a longstanding diabetic.  MRI Cardiac 10/05/17: IMPRESSION: 1. Moderately dilated left ventricle with mild basal septal hypertrophy and moderately decreased systolic function (LVEF = 41%). There is apical aneurysm involving apical inferior, septal, anterior walls and true apex and akinesis of the mid anteroseptal and inferoseptal walls. There is no evidence for a thrombus. There is almost transmural late gadolinium enhancement in the mid anteroseptal, apical anterior, septal, inferior walls and in the true septum with no chance of recovery if revascularized. Mid inferoseptal wall has good chance of recovery if revascularized. 2. Normal right ventricular size, thickness and systolic function (LVEF =63%). There are no regional wall motion abnormalities. 3. Mild mitral and trivial tricuspid regurgitation. 4. Trivial pericardial effusion with no signs of tamponade.   Carotid U/S 11/18/17: Final Interpretation: Right Carotid: There is evidence in the right ICA of a 1-39% stenosis. Left Carotid: There is evidence in the left ICA of a 1-39% stenosis. Vertebrals: Both vertebral arteries were patent with antegrade flow.  CXR 11/28/17: IMPRESSION: No active cardiopulmonary disease.  PFTs 11/18/17: FVC 4.76 (81%), FEV1 3.86 (84%), DLCO unc 27.15 (71%).   Preoperative labs noted. He could not provide urine specimen for UA, so will have to be done on the day of surgery. A1c 6.3 on 09/16/17.   If no acute changes then I anticipate he  can proceed as planned.  Travis Ochsllison Dakwon Wenberg, PA-C [facility]hesiology Phone [phone] 11/21/2017 11:29 AM

## 2017-11-25 NOTE — Pre-Procedure Instructions (Signed)
Travis CabalMichael T Pree  11/25/2017      [facility] [id number] - [place], [place] - [address] [place] [address] Phone: [phone] Fax: [phone]  [id number] - [place], [place] - [address] [place] [address] Phone: [phone] Fax: [phone]    Your procedure is scheduled on Tuesday December 18.  [facility] at 5:30 A.M.  Call this number if you have problems the morning of surgery:  [phone]   Remember:  Do not eat food or drink liquids after midnight.  Take these medicines the morning of surgery with A SIP OF WATER:   Gabapentin (neurontin) Metoprolol (Toprol-XL) Ranitidine (zantac)  TAKE Half dose of Levemir the night before surgery. (10 units of levemir)  7 days prior to surgery STOP taking any Aleve, Naproxen, Ibuprofen, Motrin, Advil, Goody's, BC's, all herbal medications, fish oil, and all vitamins ** FOLLOW Surgeon's instructions on stopping Aspirin. If no instructions were given, please call surgeon's office.**     How to Manage Your Diabetes Before and After Surgery  Why is it important to control my blood sugar before and after surgery? . Improving blood sugar levels before and after surgery helps healing and can limit problems. . A way of improving blood sugar control is eating a healthy diet by: o  Eating less sugar and carbohydrates o  Increasing activity/exercise o  Talking with your doctor about reaching your blood sugar goals . High blood sugars (greater than 180 mg/dL) can raise your risk of infections and slow your recovery, so you will need to focus on controlling your diabetes during the weeks before surgery. . Make sure that the doctor who takes care of your diabetes knows about your planned surgery including the date and location.  How do I manage my blood sugar  before surgery? . Check your blood sugar at least 4 times a day, starting 2 days before surgery, to make sure that the level is not too high or low. o Check your blood sugar the morning of your surgery when you wake up and every 2 hours until you get to the Short Stay unit. . If your blood sugar is less than 70 mg/dL, you will need to treat for low blood sugar: o Do not take insulin. o Treat a low blood sugar (less than 70 mg/dL) with  cup of clear juice (cranberry or apple), 4 glucose tablets, OR glucose gel. Recheck blood sugar in 15 minutes after treatment (to make sure it is greater than 70 mg/dL). If your blood sugar is not greater than 70 mg/dL on recheck, call [phone] o  for further instructions. . Report your blood sugar to the short stay nurse when you get to Short Stay.  . If you are admitted to the hospital after surgery: o Your blood sugar will be checked by the staff and you will probably be given insulin after surgery (instead of oral diabetes medicines) to make sure you have good blood sugar levels. o The goal for blood sugar control after surgery is 80-180 mg/dL.                Do not wear jewelry, make-up or nail polish.  Do not wear lotions, powders, or perfumes, or deodorant.  Do not shave 48 hours prior to surgery.  Men may  shave face and neck.  Do not bring valuables to the hospital.  [facility] is not responsible for any belongings or valuables.  Contacts, dentures or bridgework may not be worn into surgery.  Leave your suitcase in the car.  After surgery it may be brought to your room.  For patients admitted to the hospital, discharge time will be determined by your treatment team.  Patients discharged the day of surgery will not be allowed to drive home.   Special instructions:    [facility]- Preparing For Surgery  Before surgery, you can play an important role. Because skin is not sterile, your skin needs to be as free of germs as possible.  You can reduce the number of germs on your skin by washing with CHG (chlorahexidine gluconate) Soap before surgery.  CHG is an antiseptic cleaner which kills germs and bonds with the skin to continue killing germs even after washing.  Please do not use if you have an allergy to CHG or antibacterial soaps. If your skin becomes reddened/irritated stop using the CHG.  Do not shave (including legs and underarms) for at least 48 hours prior to first CHG shower. It is OK to shave your face.  Please follow these instructions carefully.   1. Shower the NIGHT BEFORE SURGERY and the MORNING OF SURGERY with CHG.   2. If you chose to wash your hair, wash your hair first as usual with your normal shampoo.  3. After you shampoo, rinse your hair and body thoroughly to remove the shampoo.  4. Use CHG as you would any other liquid soap. You can apply CHG directly to the skin and wash gently with a scrungie or a clean washcloth.   5. Apply the CHG Soap to your body ONLY FROM THE NECK DOWN.  Do not use on open wounds or open sores. Avoid contact with your eyes, ears, mouth and genitals (private parts). Wash Face and genitals (private parts)  with your normal soap.  6. Wash thoroughly, paying special attention to the area where your surgery will be performed.  7. Thoroughly rinse your body with warm water from the neck down.  8. DO NOT shower/wash with your normal soap after using and rinsing off the CHG Soap.  9. Pat yourself dry with a CLEAN TOWEL.  10. Wear CLEAN PAJAMAS to bed the night before surgery, wear comfortable clothes the morning of surgery  11. Place CLEAN SHEETS on your bed the night of your first shower and DO NOT SLEEP WITH PETS.    Day of Surgery: Do not apply any deodorants/lotions. Please wear clean clothes to the hospital/surgery center.      Please read over the following fact sheets that you were given. Coughing and Deep Breathing and Surgical Site Infection  Prevention

## 2017-11-28 ENCOUNTER — Encounter (HOSPITAL_COMMUNITY)
Admission: RE | Admit: 2017-11-28 | Discharge: 2017-11-28 | Disposition: A | Discharge status: Home / Self Care | Payer: Medicaid Other | Source: Ambulatory Visit | Attending: Cardiothoracic Surgery | Admitting: Cardiothoracic Surgery

## 2017-11-28 ENCOUNTER — Other Ambulatory Visit: Payer: Self-pay

## 2017-11-28 ENCOUNTER — Encounter (HOSPITAL_COMMUNITY): Payer: Self-pay

## 2017-11-28 ENCOUNTER — Encounter (HOSPITAL_COMMUNITY): Payer: Self-pay | Admitting: Certified Registered Nurse Anesthetist

## 2017-11-28 DIAGNOSIS — Z01812 Encounter for preprocedural laboratory examination: Secondary | ICD-10-CM | POA: Insufficient documentation

## 2017-11-28 DIAGNOSIS — I253 Aneurysm of heart: Secondary | ICD-10-CM

## 2017-11-28 DIAGNOSIS — I2510 Atherosclerotic heart disease of native coronary artery without angina pectoris: Secondary | ICD-10-CM | POA: Insufficient documentation

## 2017-11-28 LAB — COMPREHENSIVE METABOLIC PANEL
ALT: 25 U/L (ref 17–63)
AST: 23 U/L (ref 15–41)
Albumin: 3.6 g/dL (ref 3.5–5.0)
Alkaline Phosphatase: 58 U/L (ref 38–126)
Anion gap: 9 (ref 5–15)
BUN: 13 mg/dL (ref 6–20)
CO2: 23 mmol/L (ref 22–32)
Calcium: 8.9 mg/dL (ref 8.9–10.3)
Chloride: 104 mmol/L (ref 101–111)
Creatinine, Ser: 0.89 mg/dL (ref 0.61–1.24)
GFR calc Af Amer: >60 mL/min (ref >60)
GFR calc non Af Amer: >60 mL/min (ref >60)
Glucose, Bld: 221 mg/dL — ABNORMAL HIGH (ref 65–99)
Potassium: 4.4 mmol/L (ref 3.5–5.1)
Sodium: 136 mmol/L (ref 135–145)
Total Bilirubin: 0.9 mg/dL (ref 0.3–1.2)
Total Protein: 6.3 g/dL — ABNORMAL LOW (ref 6.5–8.1)

## 2017-11-28 LAB — CBC
HCT: 39 % (ref 39.0–52.0)
Hemoglobin: 13.2 g/dL (ref 13.0–17.0)
MCH: 29.5 pg (ref 26.0–34.0)
MCHC: 33.8 g/dL (ref 30.0–36.0)
MCV: 87.1 fL (ref 78.0–100.0)
Platelets: 232 10*3/uL (ref 150–400)
RBC: 4.48 MIL/uL (ref 4.22–5.81)
RDW: 13.5 % (ref 11.5–15.5)
WBC: 9.1 10*3/uL (ref 4.0–10.5)

## 2017-11-28 LAB — BLOOD GAS, ARTERIAL
Acid-Base Excess: 1.5 mmol/L (ref 0.0–2.0)
Bicarbonate: 25.5 mmol/L (ref 20.0–28.0)
Drawn by: 449841
FIO2: 21
pCO2 arterial: 40.3 mmHg (ref 32.0–48.0)
pH, Arterial: 7.418 (ref 7.350–7.450)
pO2, Arterial: 156 mmHg — ABNORMAL HIGH (ref 83.0–108.0)

## 2017-11-28 LAB — URINALYSIS, ROUTINE W REFLEX MICROSCOPIC
Bilirubin Urine: NEGATIVE
Glucose, UA: >=500 mg/dL — AB
Hgb urine dipstick: NEGATIVE
Ketones, ur: NEGATIVE mg/dL
Leukocytes, UA: NEGATIVE
Nitrite: NEGATIVE
Protein, ur: NEGATIVE mg/dL
Specific Gravity, Urine: 1.018 (ref 1.005–1.030)
pH: 5 (ref 5.0–8.0)

## 2017-11-28 LAB — TYPE AND SCREEN
ABO/RH(D): A NEG
Antibody Screen: NEGATIVE

## 2017-11-28 LAB — PROTIME-INR
INR: 1.06
Prothrombin Time: 13.7 seconds (ref 11.4–15.2)

## 2017-11-28 LAB — APTT: aPTT: 33 seconds (ref 24–36)

## 2017-11-28 LAB — GLUCOSE, CAPILLARY: Glucose-Capillary: 172 mg/dL — ABNORMAL HIGH (ref 65–99)

## 2017-11-28 MED ORDER — POTASSIUM CHLORIDE 2 MEQ/ML IV SOLN
80.0000 meq | INTRAVENOUS | Status: DC
  Filled 2017-11-28: qty 40

## 2017-11-28 MED ORDER — TRANEXAMIC ACID (OHS) BOLUS VIA INFUSION
15.0000 mg/kg | INTRAVENOUS | Status: AC
  Administered 2017-11-29: 1522.5 mg via INTRAVENOUS
  Filled 2017-11-28: qty 1523

## 2017-11-28 MED ORDER — DEXMEDETOMIDINE HCL IN NACL 400 MCG/100ML IV SOLN
0.1000 ug/kg/h | INTRAVENOUS | Status: AC
  Administered 2017-11-29: .3 ug/kg/h via INTRAVENOUS
  Filled 2017-11-28: qty 100

## 2017-11-28 MED ORDER — DOPAMINE-DEXTROSE 3.2-5 MG/ML-% IV SOLN
0.0000 ug/kg/min | INTRAVENOUS | Status: AC
Start: 2017-11-29 — End: 2017-11-29
  Administered 2017-11-29: 2.5 ug/kg/min via INTRAVENOUS
  Filled 2017-11-28: qty 250

## 2017-11-28 MED ORDER — EPINEPHRINE PF 1 MG/ML IJ SOLN
0.0000 ug/min | INTRAVENOUS | Status: DC
  Filled 2017-11-28: qty 4

## 2017-11-28 MED ORDER — VANCOMYCIN HCL 10 G IV SOLR
1500.0000 mg | INTRAVENOUS | Status: AC
  Administered 2017-11-29: 1500 mg via INTRAVENOUS
  Filled 2017-11-28: qty 1500

## 2017-11-28 MED ORDER — TRANEXAMIC ACID (OHS) PUMP PRIME SOLUTION
2.0000 mg/kg | INTRAVENOUS | Status: DC
  Filled 2017-11-28: qty 2.03

## 2017-11-28 MED ORDER — DEXTROSE 5 % IV SOLN
750.0000 mg | INTRAVENOUS | Status: DC
  Filled 2017-11-28: qty 750

## 2017-11-28 MED ORDER — NITROGLYCERIN IN D5W 200-5 MCG/ML-% IV SOLN
2.0000 ug/min | INTRAVENOUS | Status: DC
  Filled 2017-11-28: qty 250

## 2017-11-28 MED ORDER — INSULIN REGULAR HUMAN 100 UNIT/ML IJ SOLN
INTRAMUSCULAR | Status: AC
  Administered 2017-11-29: 1.1 [IU]/h via INTRAVENOUS
  Filled 2017-11-28: qty 1

## 2017-11-28 MED ORDER — TRANEXAMIC ACID 1000 MG/10ML IV SOLN
1.5000 mg/kg/h | INTRAVENOUS | Status: AC
  Administered 2017-11-29: 1.5 mg/kg/h via INTRAVENOUS
  Filled 2017-11-28: qty 25

## 2017-11-28 MED ORDER — PLASMA-LYTE 148 IV SOLN
INTRAVENOUS | Status: AC
  Administered 2017-11-29: 500 mL
  Filled 2017-11-28: qty 2.5

## 2017-11-28 MED ORDER — SODIUM CHLORIDE 0.9 % IV SOLN
INTRAVENOUS | Status: DC
  Filled 2017-11-28: qty 30

## 2017-11-28 MED ORDER — MILRINONE LACTATE IN DEXTROSE 20-5 MG/100ML-% IV SOLN
0.1250 ug/kg/min | INTRAVENOUS | Status: AC
  Administered 2017-11-29: .25 ug/kg/min via INTRAVENOUS
  Filled 2017-11-28: qty 100

## 2017-11-28 MED ORDER — SODIUM CHLORIDE 0.9 % IV SOLN
30.0000 ug/min | INTRAVENOUS | Status: AC
  Administered 2017-11-29: 15 ug/min via INTRAVENOUS
  Filled 2017-11-28: qty 2

## 2017-11-28 MED ORDER — DEXTROSE 5 % IV SOLN
1.5000 g | INTRAVENOUS | Status: AC
  Administered 2017-11-29: 1.5 g via INTRAVENOUS
  Administered 2017-11-29: .75 g via INTRAVENOUS
  Filled 2017-11-28: qty 1.5

## 2017-11-28 MED ORDER — MAGNESIUM SULFATE 50 % IJ SOLN
40.0000 meq | INTRAMUSCULAR | Status: DC
  Filled 2017-11-28: qty 9.85

## 2017-11-28 NOTE — Anesthesia Preprocedure Evaluation (Addendum)
Anesthesia Evaluation  Patient identified by MRN, date of birth, ID band Patient awake    Reviewed: Allergy & Precautions, NPO status , Patient's Chart, lab work & pertinent test results, reviewed documented beta blocker date and time   History of Anesthesia Complications Negative for: history of anesthetic complications  Airway Mallampati: II  TM Distance: >3 FB Neck ROM: Full    Dental  (+) Dental Advisory Given   Pulmonary neg pulmonary ROS,    breath sounds clear to auscultation       Cardiovascular hypertension, Pt. on medications and Pt. on home beta blockers + CAD (severe 3v disease)   Rhythm:Regular Rate:Normal  11/17/17 ECHO: EF 35-40%, inferior and apical hypokinesis 10/18 Cardiac MRI: apical aneurysm, Moderately dilated left ventricle with mild basal septal hypertrophy and moderately decreased systolic function (LVEF = 41%). apical aneurysm involving apical inferior, septal, anterior walls and true apex and akinesis of the mid anteroseptal and inferoseptal walls.      Neuro/Psych negative neurological ROS     GI/Hepatic Neg liver ROS, GERD  Medicated and Controlled,  Endo/Other  diabetes (glu 111), Insulin Dependent  Renal/GU negative Renal ROS     Musculoskeletal   Abdominal   Peds  Hematology negative hematology ROS (+)   Anesthesia Other Findings   Reproductive/Obstetrics                            Anesthesia Physical Anesthesia Plan  ASA: III  Anesthesia Plan: General   Post-op Pain Management:    Induction: Intravenous  PONV Risk Score and Plan: 2 and Treatment may vary due to age or medical condition  Airway Management Planned: Oral ETT  Additional Equipment: Arterial line, PA Cath, Ultrasound Guidance Line Placement and TEE  Intra-op Plan:   Post-operative Plan: Post-operative intubation/ventilation  Informed Consent: I have reviewed the patients History  and Physical, chart, labs and discussed the procedure including the risks, benefits and alternatives for the proposed anesthesia with the patient or authorized representative who has indicated his/her understanding and acceptance.   Dental advisory given  Plan Discussed with: CRNA and Surgeon  Anesthesia Plan Comments: (Plan routine monitors, A line, PA cath, GETA with TEE and post op ventilation)        Anesthesia Quick Evaluation

## 2017-11-28 NOTE — Progress Notes (Signed)
Office called regarding labs, spoke with Darl PikesSusan, LPN.

## 2017-11-28 NOTE — Progress Notes (Signed)
Pt's surgery cancelled due to weather last week.  New consents obtained.  Called [facility] and spoke with Darl PikesSusan, RN.  Confirmed pt does need new lab work including ABG.  Surgical PCR does not need to be re-obtained.   Reviewed pt instructions for arrival time, CHG soap, insulin administration.  EKG and CXR within date.  Chart previously reviewed by anesthesia.

## 2017-11-29 ENCOUNTER — Encounter (HOSPITAL_COMMUNITY): Payer: Self-pay | Admitting: *Deleted

## 2017-11-29 ENCOUNTER — Inpatient Hospital Stay (HOSPITAL_COMMUNITY)
Admission: RE | Admit: 2017-11-29 | Discharge: 2017-12-04 | DRG: 236 | Disposition: A | Discharge status: Home / Self Care | Payer: Medicaid Other | Source: Ambulatory Visit | Attending: Cardiothoracic Surgery | Admitting: Cardiothoracic Surgery

## 2017-11-29 ENCOUNTER — Inpatient Hospital Stay (HOSPITAL_COMMUNITY): Payer: Medicaid Other | Admitting: Anesthesiology

## 2017-11-29 ENCOUNTER — Inpatient Hospital Stay (HOSPITAL_COMMUNITY)
Admission: RE | Disposition: A | Discharge status: Home / Self Care | Payer: Self-pay | Source: Ambulatory Visit | Attending: Cardiothoracic Surgery

## 2017-11-29 ENCOUNTER — Inpatient Hospital Stay (HOSPITAL_COMMUNITY): Payer: Medicaid Other

## 2017-11-29 DIAGNOSIS — I255 Ischemic cardiomyopathy: Secondary | ICD-10-CM | POA: Diagnosis present

## 2017-11-29 DIAGNOSIS — K3184 Gastroparesis: Secondary | ICD-10-CM | POA: Diagnosis present

## 2017-11-29 DIAGNOSIS — I252 Old myocardial infarction: Secondary | ICD-10-CM | POA: Diagnosis not present

## 2017-11-29 DIAGNOSIS — Z951 Presence of aortocoronary bypass graft: Secondary | ICD-10-CM

## 2017-11-29 DIAGNOSIS — Z8249 Family history of ischemic heart disease and other diseases of the circulatory system: Secondary | ICD-10-CM

## 2017-11-29 DIAGNOSIS — E669 Obesity, unspecified: Secondary | ICD-10-CM | POA: Diagnosis present

## 2017-11-29 DIAGNOSIS — I5042 Chronic combined systolic (congestive) and diastolic (congestive) heart failure: Secondary | ICD-10-CM | POA: Diagnosis present

## 2017-11-29 DIAGNOSIS — I2510 Atherosclerotic heart disease of native coronary artery without angina pectoris: Secondary | ICD-10-CM | POA: Diagnosis present

## 2017-11-29 DIAGNOSIS — M810 Age-related osteoporosis without current pathological fracture: Secondary | ICD-10-CM | POA: Diagnosis present

## 2017-11-29 DIAGNOSIS — E1142 Type 2 diabetes mellitus with diabetic polyneuropathy: Secondary | ICD-10-CM | POA: Diagnosis present

## 2017-11-29 DIAGNOSIS — E876 Hypokalemia: Secondary | ICD-10-CM | POA: Diagnosis not present

## 2017-11-29 DIAGNOSIS — Z794 Long term (current) use of insulin: Secondary | ICD-10-CM

## 2017-11-29 DIAGNOSIS — E1143 Type 2 diabetes mellitus with diabetic autonomic (poly)neuropathy: Secondary | ICD-10-CM | POA: Diagnosis present

## 2017-11-29 DIAGNOSIS — I110 Hypertensive heart disease with heart failure: Secondary | ICD-10-CM | POA: Diagnosis present

## 2017-11-29 DIAGNOSIS — K219 Gastro-esophageal reflux disease without esophagitis: Secondary | ICD-10-CM | POA: Diagnosis present

## 2017-11-29 DIAGNOSIS — Z833 Family history of diabetes mellitus: Secondary | ICD-10-CM | POA: Diagnosis not present

## 2017-11-29 DIAGNOSIS — Z7982 Long term (current) use of aspirin: Secondary | ICD-10-CM | POA: Diagnosis not present

## 2017-11-29 DIAGNOSIS — I253 Aneurysm of heart: Secondary | ICD-10-CM

## 2017-11-29 HISTORY — PX: TEE WITHOUT CARDIOVERSION: SHX5443

## 2017-11-29 HISTORY — PX: CORONARY ARTERY BYPASS GRAFT: SHX141

## 2017-11-29 LAB — POCT I-STAT, CHEM 8
BUN: 16 mg/dL (ref 6–20)
BUN: 13 mg/dL (ref 6–20)
BUN: 14 mg/dL (ref 6–20)
BUN: 13 mg/dL (ref 6–20)
BUN: 13 mg/dL (ref 6–20)
BUN: 14 mg/dL (ref 6–20)
Calcium, Ion: 1.26 mmol/L (ref 1.15–1.40)
Calcium, Ion: 1.05 mmol/L — ABNORMAL LOW (ref 1.15–1.40)
Calcium, Ion: 1.18 mmol/L (ref 1.15–1.40)
Calcium, Ion: 1.17 mmol/L (ref 1.15–1.40)
Calcium, Ion: 1.12 mmol/L — ABNORMAL LOW (ref 1.15–1.40)
Calcium, Ion: 1.19 mmol/L (ref 1.15–1.40)
Chloride: 102 mmol/L (ref 101–111)
Chloride: 100 mmol/L — ABNORMAL LOW (ref 101–111)
Chloride: 103 mmol/L (ref 101–111)
Chloride: 98 mmol/L — ABNORMAL LOW (ref 101–111)
Chloride: 102 mmol/L (ref 101–111)
Chloride: 102 mmol/L (ref 101–111)
Creatinine, Ser: 0.7 mg/dL (ref 0.61–1.24)
Creatinine, Ser: 0.7 mg/dL (ref 0.61–1.24)
Creatinine, Ser: 0.7 mg/dL (ref 0.61–1.24)
Creatinine, Ser: 0.6 mg/dL — ABNORMAL LOW (ref 0.61–1.24)
Creatinine, Ser: 0.6 mg/dL — ABNORMAL LOW (ref 0.61–1.24)
Creatinine, Ser: 0.7 mg/dL (ref 0.61–1.24)
Glucose, Bld: 115 mg/dL — ABNORMAL HIGH (ref 65–99)
Glucose, Bld: 104 mg/dL — ABNORMAL HIGH (ref 65–99)
Glucose, Bld: 92 mg/dL (ref 65–99)
Glucose, Bld: 122 mg/dL — ABNORMAL HIGH (ref 65–99)
Glucose, Bld: 180 mg/dL — ABNORMAL HIGH (ref 65–99)
Glucose, Bld: 136 mg/dL — ABNORMAL HIGH (ref 65–99)
HCT: 33 % — ABNORMAL LOW (ref 39.0–52.0)
HCT: 26 % — ABNORMAL LOW (ref 39.0–52.0)
HCT: 29 % — ABNORMAL LOW (ref 39.0–52.0)
HCT: 26 % — ABNORMAL LOW (ref 39.0–52.0)
HCT: 30 % — ABNORMAL LOW (ref 39.0–52.0)
HCT: 28 % — ABNORMAL LOW (ref 39.0–52.0)
Hemoglobin: 11.2 g/dL — ABNORMAL LOW (ref 13.0–17.0)
Hemoglobin: 8.8 g/dL — ABNORMAL LOW (ref 13.0–17.0)
Hemoglobin: 9.9 g/dL — ABNORMAL LOW (ref 13.0–17.0)
Hemoglobin: 8.8 g/dL — ABNORMAL LOW (ref 13.0–17.0)
Hemoglobin: 10.2 g/dL — ABNORMAL LOW (ref 13.0–17.0)
Hemoglobin: 9.5 g/dL — ABNORMAL LOW (ref 13.0–17.0)
Potassium: 4.2 mmol/L (ref 3.5–5.1)
Potassium: 3.9 mmol/L (ref 3.5–5.1)
Potassium: 3.9 mmol/L (ref 3.5–5.1)
Potassium: 4.3 mmol/L (ref 3.5–5.1)
Potassium: 4.1 mmol/L (ref 3.5–5.1)
Potassium: 4.2 mmol/L (ref 3.5–5.1)
Sodium: 139 mmol/L (ref 135–145)
Sodium: 139 mmol/L (ref 135–145)
Sodium: 139 mmol/L (ref 135–145)
Sodium: 137 mmol/L (ref 135–145)
Sodium: 137 mmol/L (ref 135–145)
Sodium: 139 mmol/L (ref 135–145)
TCO2: 29 mmol/L (ref 22–32)
TCO2: 27 mmol/L (ref 22–32)
TCO2: 28 mmol/L (ref 22–32)
TCO2: 26 mmol/L (ref 22–32)
TCO2: 24 mmol/L (ref 22–32)
TCO2: 23 mmol/L (ref 22–32)

## 2017-11-29 LAB — CREATININE, SERUM
Creatinine, Ser: 0.85 mg/dL (ref 0.61–1.24)
GFR calc Af Amer: >60 mL/min (ref >60)
GFR calc non Af Amer: >60 mL/min (ref >60)

## 2017-11-29 LAB — GLUCOSE, CAPILLARY
Glucose-Capillary: 111 mg/dL — ABNORMAL HIGH (ref 65–99)
Glucose-Capillary: 160 mg/dL — ABNORMAL HIGH (ref 65–99)
Glucose-Capillary: 151 mg/dL — ABNORMAL HIGH (ref 65–99)
Glucose-Capillary: 141 mg/dL — ABNORMAL HIGH (ref 65–99)
Glucose-Capillary: 134 mg/dL — ABNORMAL HIGH (ref 65–99)
Glucose-Capillary: 133 mg/dL — ABNORMAL HIGH (ref 65–99)
Glucose-Capillary: 131 mg/dL — ABNORMAL HIGH (ref 65–99)
Glucose-Capillary: 157 mg/dL — ABNORMAL HIGH (ref 65–99)
Glucose-Capillary: 143 mg/dL — ABNORMAL HIGH (ref 65–99)
Glucose-Capillary: 131 mg/dL — ABNORMAL HIGH (ref 65–99)

## 2017-11-29 LAB — POCT I-STAT 3, ART BLOOD GAS (G3+)
Acid-Base Excess: 1 mmol/L (ref 0.0–2.0)
Acid-base deficit: 1 mmol/L (ref 0.0–2.0)
Acid-base deficit: 1 mmol/L (ref 0.0–2.0)
Acid-base deficit: 3 mmol/L — ABNORMAL HIGH (ref 0.0–2.0)
Acid-base deficit: 3 mmol/L — ABNORMAL HIGH (ref 0.0–2.0)
Bicarbonate: 25.3 mmol/L (ref 20.0–28.0)
Bicarbonate: 24.5 mmol/L (ref 20.0–28.0)
Bicarbonate: 21.7 mmol/L (ref 20.0–28.0)
Bicarbonate: 21.1 mmol/L (ref 20.0–28.0)
Bicarbonate: 22.5 mmol/L (ref 20.0–28.0)
O2 Saturation: 100 %
O2 Saturation: 99 %
O2 Saturation: 99 %
O2 Saturation: 99 %
O2 Saturation: 98 %
Patient temperature: 36.3
Patient temperature: 37.1
Patient temperature: 37.1
TCO2: 26 mmol/L (ref 22–32)
TCO2: 26 mmol/L (ref 22–32)
TCO2: 23 mmol/L (ref 22–32)
TCO2: 22 mmol/L (ref 22–32)
TCO2: 24 mmol/L (ref 22–32)
pCO2 arterial: 36.9 mmHg (ref 32.0–48.0)
pCO2 arterial: 41.1 mmHg (ref 32.0–48.0)
pCO2 arterial: 28.3 mmHg — ABNORMAL LOW (ref 32.0–48.0)
pCO2 arterial: 32.7 mmHg (ref 32.0–48.0)
pCO2 arterial: 40.8 mmHg (ref 32.0–48.0)
pH, Arterial: 7.444 (ref 7.350–7.450)
pH, Arterial: 7.384 (ref 7.350–7.450)
pH, Arterial: 7.49 — ABNORMAL HIGH (ref 7.350–7.450)
pH, Arterial: 7.418 (ref 7.350–7.450)
pH, Arterial: 7.351 (ref 7.350–7.450)
pO2, Arterial: 323 mmHg — ABNORMAL HIGH (ref 83.0–108.0)
pO2, Arterial: 157 mmHg — ABNORMAL HIGH (ref 83.0–108.0)
pO2, Arterial: 133 mmHg — ABNORMAL HIGH (ref 83.0–108.0)
pO2, Arterial: 159 mmHg — ABNORMAL HIGH (ref 83.0–108.0)
pO2, Arterial: 117 mmHg — ABNORMAL HIGH (ref 83.0–108.0)

## 2017-11-29 LAB — CBC
HCT: 31.2 % — ABNORMAL LOW (ref 39.0–52.0)
HCT: 28.7 % — ABNORMAL LOW (ref 39.0–52.0)
HEMOGLOBIN: 10.6 g/dL — AB (ref 13.0–17.0)
Hemoglobin: 9.8 g/dL — ABNORMAL LOW (ref 13.0–17.0)
MCH: 28.9 pg (ref 26.0–34.0)
MCH: 29.3 pg (ref 26.0–34.0)
MCHC: 34 g/dL (ref 30.0–36.0)
MCHC: 34.1 g/dL (ref 30.0–36.0)
MCV: 85 fL (ref 78.0–100.0)
MCV: 85.9 fL (ref 78.0–100.0)
Platelets: 165 10*3/uL (ref 150–400)
Platelets: 149 10*3/uL — ABNORMAL LOW (ref 150–400)
RBC: 3.67 MIL/uL — AB (ref 4.22–5.81)
RBC: 3.34 MIL/uL — ABNORMAL LOW (ref 4.22–5.81)
RDW: 13.1 % (ref 11.5–15.5)
RDW: 13.5 % (ref 11.5–15.5)
WBC: 17.8 10*3/uL — ABNORMAL HIGH (ref 4.0–10.5)
WBC: 13.5 10*3/uL — ABNORMAL HIGH (ref 4.0–10.5)

## 2017-11-29 LAB — POCT I-STAT 4, (NA,K, GLUC, HGB,HCT)
Glucose, Bld: 161 mg/dL — ABNORMAL HIGH (ref 65–99)
HCT: 31 % — ABNORMAL LOW (ref 39.0–52.0)
Hemoglobin: 10.5 g/dL — ABNORMAL LOW (ref 13.0–17.0)
Potassium: 4.1 mmol/L (ref 3.5–5.1)
Sodium: 139 mmol/L (ref 135–145)

## 2017-11-29 LAB — HEMOGLOBIN AND HEMATOCRIT, BLOOD
HCT: 26.4 % — ABNORMAL LOW (ref 39.0–52.0)
Hemoglobin: 9.2 g/dL — ABNORMAL LOW (ref 13.0–17.0)

## 2017-11-29 LAB — PROTIME-INR
INR: 1.47
Prothrombin Time: 17.7 seconds — ABNORMAL HIGH (ref 11.4–15.2)

## 2017-11-29 LAB — APTT: aPTT: 32 seconds (ref 24–36)

## 2017-11-29 LAB — PLATELET COUNT: Platelets: 180 10*3/uL (ref 150–400)

## 2017-11-29 LAB — PREPARE RBC (CROSSMATCH)

## 2017-11-29 LAB — HEMOGLOBIN A1C
Hgb A1c MFr Bld: 7 % — ABNORMAL HIGH (ref 4.8–5.6)
Mean Plasma Glucose: 154 mg/dL

## 2017-11-29 LAB — MAGNESIUM: Magnesium: 2.5 mg/dL — ABNORMAL HIGH (ref 1.7–2.4)

## 2017-11-29 SURGERY — CORONARY ARTERY BYPASS GRAFTING (CABG)
Anesthesia: General | Site: Chest

## 2017-11-29 MED ORDER — HEMOSTATIC AGENTS (NO CHARGE) OPTIME
TOPICAL | Status: DC | PRN
  Administered 2017-11-29: 1 via TOPICAL

## 2017-11-29 MED ORDER — FENTANYL CITRATE (PF) 250 MCG/5ML IJ SOLN
INTRAMUSCULAR | Status: AC
  Filled 2017-11-29: qty 5

## 2017-11-29 MED ORDER — ASPIRIN 81 MG PO CHEW
324.0000 mg | CHEWABLE_TABLET | Freq: Every day | ORAL | Status: DC
  Filled 2017-11-29 (×2): qty 4

## 2017-11-29 MED ORDER — LACTATED RINGERS IV SOLN
INTRAVENOUS | Status: DC
  Administered 2017-11-29: 15:00:00 via INTRAVENOUS

## 2017-11-29 MED ORDER — PROTAMINE SULFATE 10 MG/ML IV SOLN
INTRAVENOUS | Status: DC | PRN
  Administered 2017-11-29: 10 mg via INTRAVENOUS
  Administered 2017-11-29: 290 mg via INTRAVENOUS

## 2017-11-29 MED ORDER — NITROGLYCERIN IN D5W 200-5 MCG/ML-% IV SOLN: 0.0000 ug/min | INTRAVENOUS | Status: DC

## 2017-11-29 MED ORDER — OXYCODONE HCL 5 MG PO TABS
5.0000 mg | ORAL_TABLET | ORAL | Status: DC | PRN
  Administered 2017-11-30 – 2017-12-01 (×3): 10 mg via ORAL
  Administered 2017-12-02 – 2017-12-03 (×2): 5 mg via ORAL
  Administered 2017-12-03 – 2017-12-04 (×4): 10 mg via ORAL
  Filled 2017-11-29: qty 1
  Filled 2017-11-29 (×2): qty 2
  Filled 2017-11-29: qty 1
  Filled 2017-11-29 (×5): qty 2

## 2017-11-29 MED ORDER — MIDAZOLAM HCL 2 MG/2ML IJ SOLN
2.0000 mg | INTRAMUSCULAR | Status: DC | PRN
  Filled 2017-11-29: qty 2

## 2017-11-29 MED ORDER — PROPOFOL 10 MG/ML IV BOLUS
INTRAVENOUS | Status: DC | PRN
Start: 2017-11-29 — End: 2017-11-29
  Administered 2017-11-29: 30 mg via INTRAVENOUS

## 2017-11-29 MED ORDER — SODIUM CHLORIDE 0.9 % IJ SOLN
OROMUCOSAL | Status: DC | PRN
  Administered 2017-11-29 (×3): 4 mL via TOPICAL

## 2017-11-29 MED ORDER — POTASSIUM CHLORIDE 10 MEQ/50ML IV SOLN
10.0000 meq | INTRAVENOUS | Status: AC
  Administered 2017-11-29 (×3): 10 meq via INTRAVENOUS

## 2017-11-29 MED ORDER — MORPHINE SULFATE (PF) 4 MG/ML IV SOLN: 1.0000 mg | INTRAVENOUS | Status: DC | PRN

## 2017-11-29 MED ORDER — HEPARIN SODIUM (PORCINE) 1000 UNIT/ML IJ SOLN
INTRAMUSCULAR | Status: DC | PRN
  Administered 2017-11-29: 28000 [IU] via INTRAVENOUS
  Administered 2017-11-29 (×2): 2000 [IU] via INTRAVENOUS

## 2017-11-29 MED ORDER — LACTATED RINGERS IV SOLN
INTRAVENOUS | Status: DC | PRN
  Administered 2017-11-29: 07:00:00 via INTRAVENOUS

## 2017-11-29 MED ORDER — SODIUM CHLORIDE 0.9 % IV SOLN
0.0000 ug/kg/h | INTRAVENOUS | Status: DC
  Administered 2017-11-29: 0.7 ug/kg/h via INTRAVENOUS
  Filled 2017-11-29: qty 2

## 2017-11-29 MED ORDER — SODIUM CHLORIDE 0.9 % IV SOLN
INTRAVENOUS | Status: DC
  Administered 2017-11-29: 15:00:00 via INTRAVENOUS

## 2017-11-29 MED ORDER — FAMOTIDINE IN NACL 20-0.9 MG/50ML-% IV SOLN
20.0000 mg | Freq: Two times a day (BID) | INTRAVENOUS | Status: AC
  Administered 2017-11-29 (×2): 20 mg via INTRAVENOUS
  Filled 2017-11-29: qty 50

## 2017-11-29 MED ORDER — SODIUM CHLORIDE 0.9% FLUSH
3.0000 mL | Freq: Two times a day (BID) | INTRAVENOUS | Status: DC
  Administered 2017-11-30 – 2017-12-04 (×9): 3 mL via INTRAVENOUS

## 2017-11-29 MED ORDER — DOCUSATE SODIUM 100 MG PO CAPS
200.0000 mg | ORAL_CAPSULE | Freq: Every day | ORAL | Status: DC
  Administered 2017-11-30 – 2017-12-04 (×5): 200 mg via ORAL
  Filled 2017-11-29 (×5): qty 2

## 2017-11-29 MED ORDER — MIDAZOLAM HCL 10 MG/2ML IJ SOLN
INTRAMUSCULAR | Status: AC
  Filled 2017-11-29: qty 2

## 2017-11-29 MED ORDER — SODIUM CHLORIDE 0.9 % IV SOLN
0.0000 ug/min | INTRAVENOUS | Status: DC
  Administered 2017-11-29: 10 ug/min via INTRAVENOUS
  Administered 2017-12-01: 2 ug/min via INTRAVENOUS
  Filled 2017-11-29: qty 20

## 2017-11-29 MED ORDER — ROSUVASTATIN CALCIUM 10 MG PO TABS
40.0000 mg | ORAL_TABLET | Freq: Every day | ORAL | Status: DC
  Administered 2017-11-30 – 2017-12-04 (×5): 40 mg via ORAL
  Filled 2017-11-29: qty 4
  Filled 2017-11-29: qty 2
  Filled 2017-11-29: qty 1
  Filled 2017-11-29: qty 2
  Filled 2017-11-29 (×2): qty 1
  Filled 2017-11-29: qty 2
  Filled 2017-11-29 (×2): qty 1

## 2017-11-29 MED ORDER — METOCLOPRAMIDE HCL 5 MG/ML IJ SOLN
10.0000 mg | Freq: Four times a day (QID) | INTRAMUSCULAR | Status: AC
  Administered 2017-11-29 – 2017-12-04 (×20): 10 mg via INTRAVENOUS
  Filled 2017-11-29 (×19): qty 2

## 2017-11-29 MED ORDER — ORAL CARE MOUTH RINSE
15.0000 mL | Freq: Two times a day (BID) | OROMUCOSAL | Status: DC
  Administered 2017-11-29 – 2017-12-01 (×4): 15 mL via OROMUCOSAL

## 2017-11-29 MED ORDER — ALBUMIN HUMAN 5 % IV SOLN
250.0000 mL | INTRAVENOUS | Status: AC | PRN
  Administered 2017-11-29 – 2017-11-30 (×3): 250 mL via INTRAVENOUS
  Filled 2017-11-29: qty 250

## 2017-11-29 MED ORDER — FENTANYL CITRATE (PF) 250 MCG/5ML IJ SOLN
INTRAMUSCULAR | Status: AC
  Filled 2017-11-29: qty 20

## 2017-11-29 MED ORDER — PROPOFOL 10 MG/ML IV BOLUS
INTRAVENOUS | Status: AC
  Filled 2017-11-29: qty 20

## 2017-11-29 MED ORDER — MORPHINE SULFATE (PF) 4 MG/ML IV SOLN
2.0000 mg | INTRAVENOUS | Status: DC | PRN
  Administered 2017-11-30: 2 mg via INTRAVENOUS
  Filled 2017-11-29: qty 1

## 2017-11-29 MED ORDER — INSULIN REGULAR BOLUS VIA INFUSION
0.0000 [IU] | Freq: Three times a day (TID) | INTRAVENOUS | Status: DC
  Filled 2017-11-29: qty 10

## 2017-11-29 MED ORDER — ACETAMINOPHEN 160 MG/5ML PO SOLN: 650.0000 mg | Freq: Once | ORAL | Status: AC

## 2017-11-29 MED ORDER — FENTANYL CITRATE (PF) 250 MCG/5ML IJ SOLN
INTRAMUSCULAR | Status: DC | PRN
  Administered 2017-11-29 (×2): 100 ug via INTRAVENOUS
  Administered 2017-11-29: 650 ug via INTRAVENOUS
  Administered 2017-11-29: 150 ug via INTRAVENOUS
  Administered 2017-11-29: 100 ug via INTRAVENOUS
  Administered 2017-11-29: 250 ug via INTRAVENOUS
  Administered 2017-11-29: 150 ug via INTRAVENOUS

## 2017-11-29 MED ORDER — FAMOTIDINE 20 MG PO TABS
20.0000 mg | ORAL_TABLET | Freq: Two times a day (BID) | ORAL | Status: DC
  Administered 2017-11-30 – 2017-12-04 (×9): 20 mg via ORAL
  Filled 2017-11-29 (×9): qty 1

## 2017-11-29 MED ORDER — SODIUM CHLORIDE 0.9% FLUSH: 3.0000 mL | INTRAVENOUS | Status: DC | PRN

## 2017-11-29 MED ORDER — CHLORHEXIDINE GLUCONATE 4 % EX LIQD: 30.0000 mL | CUTANEOUS | Status: DC

## 2017-11-29 MED ORDER — CHLORHEXIDINE GLUCONATE 0.12 % MT SOLN: 15.0000 mL | Freq: Once | OROMUCOSAL | Status: DC

## 2017-11-29 MED ORDER — MAGNESIUM SULFATE 4 GM/100ML IV SOLN
4.0000 g | Freq: Once | INTRAVENOUS | Status: AC
  Administered 2017-11-29: 4 g via INTRAVENOUS
  Filled 2017-11-29: qty 100

## 2017-11-29 MED ORDER — ASPIRIN EC 325 MG PO TBEC
325.0000 mg | DELAYED_RELEASE_TABLET | Freq: Every day | ORAL | Status: DC
  Administered 2017-11-30 – 2017-12-04 (×5): 325 mg via ORAL
  Filled 2017-11-29 (×5): qty 1

## 2017-11-29 MED ORDER — METOPROLOL TARTRATE 12.5 MG HALF TABLET: 12.5000 mg | ORAL_TABLET | Freq: Once | ORAL | Status: DC

## 2017-11-29 MED ORDER — ACETAMINOPHEN 160 MG/5ML PO SOLN: 1000.0000 mg | Freq: Four times a day (QID) | ORAL | Status: DC

## 2017-11-29 MED ORDER — ALBUMIN HUMAN 5 % IV SOLN
INTRAVENOUS | Status: DC | PRN
  Administered 2017-11-29 (×2): via INTRAVENOUS

## 2017-11-29 MED ORDER — VANCOMYCIN HCL IN DEXTROSE 1-5 GM/200ML-% IV SOLN
1000.0000 mg | Freq: Once | INTRAVENOUS | Status: AC
  Administered 2017-11-29: 1000 mg via INTRAVENOUS
  Filled 2017-11-29: qty 200

## 2017-11-29 MED ORDER — CHLORHEXIDINE GLUCONATE 0.12 % MT SOLN
15.0000 mL | OROMUCOSAL | Status: AC
  Administered 2017-11-29: 15 mL via OROMUCOSAL

## 2017-11-29 MED ORDER — CHLORHEXIDINE GLUCONATE 0.12 % MT SOLN
15.0000 mL | Freq: Two times a day (BID) | OROMUCOSAL | Status: DC
  Administered 2017-11-29 – 2017-11-30 (×3): 15 mL via OROMUCOSAL
  Filled 2017-11-29 (×2): qty 15

## 2017-11-29 MED ORDER — TRAMADOL HCL 50 MG PO TABS
50.0000 mg | ORAL_TABLET | ORAL | Status: DC | PRN
  Administered 2017-12-02 – 2017-12-03 (×2): 100 mg via ORAL
  Filled 2017-11-29 (×2): qty 2

## 2017-11-29 MED ORDER — LACTATED RINGERS IV SOLN: 500.0000 mL | Freq: Once | INTRAVENOUS | Status: DC | PRN

## 2017-11-29 MED ORDER — DOPAMINE-DEXTROSE 3.2-5 MG/ML-% IV SOLN: 0.0000 ug/kg/min | INTRAVENOUS | Status: DC

## 2017-11-29 MED ORDER — DEXTROSE 5 % IV SOLN
1.5000 g | Freq: Two times a day (BID) | INTRAVENOUS | Status: AC
  Administered 2017-11-29 – 2017-12-01 (×4): 1.5 g via INTRAVENOUS
  Filled 2017-11-29 (×4): qty 1.5

## 2017-11-29 MED ORDER — SODIUM CHLORIDE 0.45 % IV SOLN: INTRAVENOUS | Status: DC | PRN

## 2017-11-29 MED ORDER — BISACODYL 10 MG RE SUPP: 10.0000 mg | Freq: Every day | RECTAL | Status: DC

## 2017-11-29 MED ORDER — MORPHINE SULFATE (PF) 2 MG/ML IV SOLN: 2.0000 mg | INTRAVENOUS | Status: DC | PRN

## 2017-11-29 MED ORDER — MILRINONE LACTATE IN DEXTROSE 20-5 MG/100ML-% IV SOLN
0.1250 ug/kg/min | INTRAVENOUS | Status: DC
  Administered 2017-11-29 – 2017-11-30 (×2): 0.25 ug/kg/min via INTRAVENOUS
  Filled 2017-11-29 (×2): qty 100

## 2017-11-29 MED ORDER — MORPHINE SULFATE (PF) 2 MG/ML IV SOLN: 1.0000 mg | INTRAVENOUS | Status: DC | PRN

## 2017-11-29 MED ORDER — PANTOPRAZOLE SODIUM 40 MG PO TBEC: 40.0000 mg | DELAYED_RELEASE_TABLET | Freq: Every day | ORAL | Status: DC

## 2017-11-29 MED ORDER — METOPROLOL TARTRATE 12.5 MG HALF TABLET
12.5000 mg | ORAL_TABLET | Freq: Two times a day (BID) | ORAL | Status: DC
  Administered 2017-11-30 – 2017-12-04 (×9): 12.5 mg via ORAL
  Filled 2017-11-29 (×10): qty 1

## 2017-11-29 MED ORDER — SODIUM CHLORIDE 0.9 % IV SOLN
250.0000 mL | INTRAVENOUS | Status: DC
  Administered 2017-11-30: 250 mL via INTRAVENOUS

## 2017-11-29 MED ORDER — MIDAZOLAM HCL 5 MG/5ML IJ SOLN
INTRAMUSCULAR | Status: DC | PRN
  Administered 2017-11-29: 2 mg via INTRAVENOUS
  Administered 2017-11-29: 4 mg via INTRAVENOUS
  Administered 2017-11-29: 2 mg via INTRAVENOUS

## 2017-11-29 MED ORDER — METOPROLOL TARTRATE 25 MG/10 ML ORAL SUSPENSION
12.5000 mg | Freq: Two times a day (BID) | ORAL | Status: DC
  Filled 2017-11-29: qty 5

## 2017-11-29 MED ORDER — ACETAMINOPHEN 650 MG RE SUPP
650.0000 mg | Freq: Once | RECTAL | Status: AC
  Administered 2017-11-29: 650 mg via RECTAL

## 2017-11-29 MED ORDER — METOPROLOL TARTRATE 5 MG/5ML IV SOLN: 2.5000 mg | INTRAVENOUS | Status: DC | PRN

## 2017-11-29 MED ORDER — ONDANSETRON HCL 4 MG/2ML IJ SOLN
4.0000 mg | Freq: Four times a day (QID) | INTRAMUSCULAR | Status: DC | PRN
  Administered 2017-11-30 – 2017-12-04 (×7): 4 mg via INTRAVENOUS
  Filled 2017-11-29 (×8): qty 2

## 2017-11-29 MED ORDER — SODIUM CHLORIDE 0.9 % IV SOLN
INTRAVENOUS | Status: AC
  Filled 2017-11-29: qty 1

## 2017-11-29 MED ORDER — GABAPENTIN 400 MG PO CAPS
800.0000 mg | ORAL_CAPSULE | Freq: Three times a day (TID) | ORAL | Status: DC
  Administered 2017-11-30: 800 mg via ORAL
  Filled 2017-11-29 (×2): qty 2

## 2017-11-29 MED ORDER — ACETAMINOPHEN 500 MG PO TABS
1000.0000 mg | ORAL_TABLET | Freq: Four times a day (QID) | ORAL | Status: DC
  Administered 2017-11-29 – 2017-12-03 (×13): 1000 mg via ORAL
  Filled 2017-11-29 (×13): qty 2

## 2017-11-29 MED ORDER — 0.9 % SODIUM CHLORIDE (POUR BTL) OPTIME
TOPICAL | Status: DC | PRN
  Administered 2017-11-29: 1000 mL
  Administered 2017-11-29: 5000 mL

## 2017-11-29 MED ORDER — BISACODYL 5 MG PO TBEC
10.0000 mg | DELAYED_RELEASE_TABLET | Freq: Every day | ORAL | Status: DC
  Administered 2017-11-30 – 2017-12-04 (×4): 10 mg via ORAL
  Filled 2017-11-29 (×5): qty 2

## 2017-11-29 MED ORDER — ROCURONIUM BROMIDE 10 MG/ML (PF) SYRINGE
PREFILLED_SYRINGE | INTRAVENOUS | Status: DC | PRN
  Administered 2017-11-29 (×5): 50 mg via INTRAVENOUS

## 2017-11-29 SURGICAL SUPPLY — 111 items
ADAPTER CARDIO PERF ANTE/RETRO (ADAPTER) ×5 IMPLANT
BAG DECANTER FOR FLEXI CONT (MISCELLANEOUS) ×5 IMPLANT
BANDAGE ACE 4X5 VEL STRL LF (GAUZE/BANDAGES/DRESSINGS) ×5 IMPLANT
BANDAGE ACE 6X5 VEL STRL LF (GAUZE/BANDAGES/DRESSINGS) ×5 IMPLANT
BASKET HEART  (ORDER IN 25'S) (MISCELLANEOUS) ×1
BASKET HEART (ORDER IN 25'S) (MISCELLANEOUS) ×1
BASKET HEART (ORDER IN 25S) (MISCELLANEOUS) ×3 IMPLANT
BLADE 11 SAFETY STRL DISP (BLADE) ×5 IMPLANT
BLADE CLIPPER SURG (BLADE) IMPLANT
BLADE STERNUM SYSTEM 6 (BLADE) ×5 IMPLANT
BLADE SURG 12 STRL SS (BLADE) ×5 IMPLANT
BNDG GAUZE ELAST 4 BULKY (GAUZE/BANDAGES/DRESSINGS) ×5 IMPLANT
CANISTER SUCT 3000ML PPV (MISCELLANEOUS) ×5 IMPLANT
CANNULA ARTERIAL NVNT 3/8 22FR (MISCELLANEOUS) ×5 IMPLANT
CANNULA GUNDRY RCSP 15FR (MISCELLANEOUS) ×5 IMPLANT
CATH CPB KIT VANTRIGT (MISCELLANEOUS) ×5 IMPLANT
CATH HEART VENT LEFT (CATHETERS) ×3 IMPLANT
CATH ROBINSON RED A/P 18FR (CATHETERS) ×15 IMPLANT
CATH THORACIC 36FR RT ANG (CATHETERS) IMPLANT
CLIP RETRACTION 3.0MM CORONARY (MISCELLANEOUS) ×5 IMPLANT
CLIP VESOCCLUDE SM WIDE 24/CT (CLIP) ×10 IMPLANT
COVER SURGICAL LIGHT HANDLE (MISCELLANEOUS) ×5 IMPLANT
CRADLE DONUT ADULT HEAD (MISCELLANEOUS) ×5 IMPLANT
DERMABOND ADVANCED (GAUZE/BANDAGES/DRESSINGS) ×2
DERMABOND ADVANCED .7 DNX12 (GAUZE/BANDAGES/DRESSINGS) ×3 IMPLANT
DRAIN CHANNEL 32F RND 10.7 FF (WOUND CARE) IMPLANT
DRAPE CARDIOVASCULAR INCISE (DRAPES) ×2
DRAPE SLUSH/WARMER DISC (DRAPES) ×5 IMPLANT
DRAPE SRG 135X102X78XABS (DRAPES) ×3 IMPLANT
DRSG AQUACEL AG ADV 3.5X14 (GAUZE/BANDAGES/DRESSINGS) ×5 IMPLANT
ELECT BLADE 4.0 EZ CLEAN MEGAD (MISCELLANEOUS) ×5
ELECT BLADE 6.5 EXT (BLADE) ×5 IMPLANT
ELECT CAUTERY BLADE 6.4 (BLADE) ×5 IMPLANT
ELECT REM PT RETURN 9FT ADLT (ELECTROSURGICAL) ×10
ELECTRODE BLDE 4.0 EZ CLN MEGD (MISCELLANEOUS) ×3 IMPLANT
ELECTRODE REM PT RTRN 9FT ADLT (ELECTROSURGICAL) ×6 IMPLANT
FELT TEFLON 1X6 (MISCELLANEOUS) ×10 IMPLANT
FLOSEAL 10ML (HEMOSTASIS) ×5 IMPLANT
GAUZE SPONGE 4X4 12PLY STRL (GAUZE/BANDAGES/DRESSINGS) ×5 IMPLANT
GAUZE SPONGE 4X4 12PLY STRL LF (GAUZE/BANDAGES/DRESSINGS) ×5 IMPLANT
GLOVE BIO SURGEON STRL SZ 6.5 (GLOVE) ×28 IMPLANT
GLOVE BIO SURGEON STRL SZ7.5 (GLOVE) ×15 IMPLANT
GLOVE BIO SURGEON STRL SZ8.5 (GLOVE) ×20 IMPLANT
GLOVE BIO SURGEONS STRL SZ 6.5 (GLOVE) ×7
GLOVE BIOGEL PI IND STRL 6 (GLOVE) ×9 IMPLANT
GLOVE BIOGEL PI IND STRL 8.5 (GLOVE) ×9 IMPLANT
GLOVE BIOGEL PI INDICATOR 6 (GLOVE) ×6
GLOVE BIOGEL PI INDICATOR 8.5 (GLOVE) ×6
GOWN STRL REUS W/ TWL LRG LVL3 (GOWN DISPOSABLE) ×30 IMPLANT
GOWN STRL REUS W/TWL LRG LVL3 (GOWN DISPOSABLE) ×20
HEMOSTAT POWDER SURGIFOAM 1G (HEMOSTASIS) ×15 IMPLANT
HEMOSTAT SURGICEL 2X14 (HEMOSTASIS) ×5 IMPLANT
INSERT FOGARTY XLG (MISCELLANEOUS) IMPLANT
KIT BASIN OR (CUSTOM PROCEDURE TRAY) ×5 IMPLANT
KIT ROOM TURNOVER OR (KITS) ×5 IMPLANT
KIT SUCTION CATH 14FR (SUCTIONS) ×5 IMPLANT
KIT VASOVIEW HEMOPRO VH 3000 (KITS) ×5 IMPLANT
LEAD PACING MYOCARDI (MISCELLANEOUS) ×5 IMPLANT
LINE VENT (MISCELLANEOUS) ×5 IMPLANT
MARKER GRAFT CORONARY BYPASS (MISCELLANEOUS) ×15 IMPLANT
MARKER SKIN DUAL TIP RULER LAB (MISCELLANEOUS) ×5 IMPLANT
NS IRRIG 1000ML POUR BTL (IV SOLUTION) ×25 IMPLANT
PACK E OPEN HEART (SUTURE) ×5 IMPLANT
PACK OPEN HEART (CUSTOM PROCEDURE TRAY) ×5 IMPLANT
PAD ARMBOARD 7.5X6 YLW CONV (MISCELLANEOUS) ×10 IMPLANT
PAD ELECT DEFIB RADIOL ZOLL (MISCELLANEOUS) ×5 IMPLANT
PENCIL BUTTON HOLSTER BLD 10FT (ELECTRODE) ×5 IMPLANT
PUNCH AORTIC ROT 4.0MM RCL 40 (MISCELLANEOUS) ×5 IMPLANT
PUNCH AORTIC ROTATE  4.5MM 8IN (MISCELLANEOUS) ×5 IMPLANT
PUNCH AORTIC ROTATE 4.0MM (MISCELLANEOUS) IMPLANT
PUNCH AORTIC ROTATE 4.5MM 8IN (MISCELLANEOUS) IMPLANT
PUNCH AORTIC ROTATE 5MM 8IN (MISCELLANEOUS) IMPLANT
SET CARDIOPLEGIA MPS 5001102 (MISCELLANEOUS) ×5 IMPLANT
SOLUTION ANTI FOG 6CC (MISCELLANEOUS) ×5 IMPLANT
SPONGE LAP 18X18 X RAY DECT (DISPOSABLE) ×5 IMPLANT
SURGIFLO W/THROMBIN 8M KIT (HEMOSTASIS) IMPLANT
SUT BONE WAX W31G (SUTURE) ×5 IMPLANT
SUT MNCRL AB 4-0 PS2 18 (SUTURE) ×5 IMPLANT
SUT PROLENE 3 0 SH DA (SUTURE) IMPLANT
SUT PROLENE 3 0 SH1 36 (SUTURE) IMPLANT
SUT PROLENE 4 0 RB 1 (SUTURE) ×10
SUT PROLENE 4 0 SH DA (SUTURE) ×5 IMPLANT
SUT PROLENE 4-0 RB1 .5 CRCL 36 (SUTURE) ×15 IMPLANT
SUT PROLENE 5 0 C 1 36 (SUTURE) IMPLANT
SUT PROLENE 6 0 C 1 30 (SUTURE) ×15 IMPLANT
SUT PROLENE 6 0 CC (SUTURE) ×25 IMPLANT
SUT PROLENE 8 0 BV175 6 (SUTURE) IMPLANT
SUT PROLENE BLUE 7 0 (SUTURE) ×10 IMPLANT
SUT SILK  1 MH (SUTURE)
SUT SILK 1 MH (SUTURE) IMPLANT
SUT SILK 2 0 SH CR/8 (SUTURE) ×5 IMPLANT
SUT SILK 3 0 SH CR/8 (SUTURE) ×5 IMPLANT
SUT STEEL 6MS V (SUTURE) ×5 IMPLANT
SUT STEEL SZ 6 DBL 3X14 BALL (SUTURE) ×5 IMPLANT
SUT VIC AB 1 CTX 36 (SUTURE) ×4
SUT VIC AB 1 CTX36XBRD ANBCTR (SUTURE) ×6 IMPLANT
SUT VIC AB 2-0 CT1 27 (SUTURE) ×2
SUT VIC AB 2-0 CT1 TAPERPNT 27 (SUTURE) ×3 IMPLANT
SUT VIC AB 2-0 CTX 27 (SUTURE) IMPLANT
SUT VIC AB 3-0 X1 27 (SUTURE) IMPLANT
SYSTEM SAHARA CHEST DRAIN ATS (WOUND CARE) ×5 IMPLANT
TAPE CLOTH SURG 4X10 WHT LF (GAUZE/BANDAGES/DRESSINGS) ×10 IMPLANT
TAPE PAPER 2X10 WHT MICROPORE (GAUZE/BANDAGES/DRESSINGS) ×5 IMPLANT
TOWEL GREEN STERILE (TOWEL DISPOSABLE) ×5 IMPLANT
TOWEL GREEN STERILE FF (TOWEL DISPOSABLE) ×5 IMPLANT
TRAY FOLEY SILVER 16FR TEMP (SET/KITS/TRAYS/PACK) ×5 IMPLANT
TUBE SUCT INTRACARD DLP 20F (MISCELLANEOUS) ×5 IMPLANT
TUBING INSUFFLATION (TUBING) ×5 IMPLANT
UNDERPAD 30X30 (UNDERPADS AND DIAPERS) ×5 IMPLANT
VENT LEFT HEART 12002 (CATHETERS) ×5
WATER STERILE IRR 1000ML POUR (IV SOLUTION) ×10 IMPLANT

## 2017-11-29 NOTE — Anesthesia Procedure Notes (Signed)
Central Venous Catheter Insertion Performed by: Heather RobertsSinger, Gottfried Standish, MD, anesthesiologist Start/End12/18/2018 6:31 AM, 11/29/2017 6:52 AM Patient location: Pre-op. Preanesthetic checklist: patient identified, IV checked, site marked, risks and benefits discussed, surgical consent, monitors and equipment checked, pre-op evaluation, timeout performed and anesthesia consent Position: Trendelenburg Lidocaine 1% used for infiltration and patient sedated Hand hygiene performed , maximum sterile barriers used  and Seldinger technique used Catheter size: 8.5 Fr Total catheter length 8. PA cath was placed.Sheath introducer Swan type:thermodilution PA Cath depth:50 Procedure performed using ultrasound guided technique. Ultrasound Notes:anatomy identified, needle tip was noted to be adjacent to the nerve/plexus identified, no ultrasound evidence of intravascular and/or intraneural injection and image(s) printed for medical record Attempts: 1 Following insertion, line sutured and dressing applied. Post procedure assessment: free fluid flow, blood return through all ports and no air  Patient tolerated the procedure well with no immediate complications.

## 2017-11-29 NOTE — Anesthesia Procedure Notes (Signed)
Procedure Name: Intubation Date/Time: 11/29/2017 7:57 AM Performed by: Clearnce Sorrel, CRNA Pre-anesthesia Checklist: Patient identified, Emergency Drugs available, Suction available, Patient being monitored and Timeout performed Patient Re-evaluated:Patient Re-evaluated prior to induction Oxygen Delivery Method: Circle system utilized Induction Type: IV induction Ventilation: Mask ventilation without difficulty and Oral airway inserted - appropriate to patient size Laryngoscope Size: Mac and 4 Grade View: Grade I Tube type: Oral Tube size: 8.0 mm Number of attempts: 1 Airway Equipment and Method: Stylet Placement Confirmation: ETT inserted through vocal cords under direct vision,  positive ETCO2 and breath sounds checked- equal and bilateral Secured at: 23 cm Tube secured with: Tape Dental Injury: Teeth and Oropharynx as per pre-operative assessment

## 2017-11-29 NOTE — Progress Notes (Signed)
CT surgery p.m. Rounds  Patient doing well after CABG 3 for ischemic cardiomyopathy prior MI Patient extubated neuro intact Adequate urine output, scant chest tube drainage

## 2017-11-29 NOTE — Transfer of Care (Signed)
Immediate Anesthesia Transfer of Care Note  Patient: Katha CabalMichael T Brannum  Procedure(s) Performed: CORONARY ARTERY BYPASS GRAFTING (CABG) times three using the left saphaneous vien. harvested endoscopicly and left internal mammary artery. (N/A Chest) TRANSESOPHAGEAL ECHOCARDIOGRAM (TEE) (N/A )  Patient Location: SICU  Anesthesia Type:General  Level of Consciousness: Patient remains intubated per anesthesia plan  Airway & Oxygen Therapy: Patient remains intubated per anesthesia plan  Post-op Assessment: Report given to RN and Post -op Vital signs reviewed and stable  Post vital signs: Reviewed and stable  Last Vitals:  Vitals:   11/29/17 0556 11/29/17 1343  BP: 117/66 (!) (P) 109/52  Pulse: 66 (P) 80  Resp: 20 (P) 12  Temp: 37 C   SpO2: 99%     Last Pain:  Vitals:   11/29/17 0556  TempSrc: Oral      Patients Stated Pain Goal: 3 (11/29/17 0602)  Complications: No apparent anesthesia complications

## 2017-11-29 NOTE — Procedures (Signed)
Extubation Procedure Note  Patient Details:   Name: Travis [name] DOB: 07/31/1971 MRN: [id number]   Airway Documentation:  Patient's VC and NIF were within normal limits. Patient had positive cuff leak. Patient extubated per protocol. Placed on 4lpm humidified oxygen. Patient has strong cough and able to voice his name and location. Incentive spirometry instructed with teach back. Patient achieved 1100 ml x10. RN at bedside.    Evaluation  O2 sats: stable throughout Complications: No apparent complications Patient did tolerate procedure well. Bilateral Breath Sounds: Clear, Diminished   Yes  Suszanne ConnersLaura P Willine Schwalbe 11/29/2017, 5:50 PM

## 2017-11-29 NOTE — Progress Notes (Signed)
Pre Procedure note for inpatients:   Travis [name] has been scheduled for Procedure(s): CORONARY ARTERY BYPASS GRAFTING (CABG), LEFT VENTRICULAR ANEURYSM REPAIR, TEE (N/A) LEFT VENTRICULAR ANEURYSM REPAIR (Left) TRANSESOPHAGEAL ECHOCARDIOGRAM (TEE) (N/A) today. The various methods of treatment have been discussed with the patient. After consideration of the risks, benefits and treatment options the patient has consented to the planned procedure.   The patient has been seen and labs reviewed. There are no changes in the patient's condition to prevent proceeding with the planned procedure today.  Recent labs:  Lab Results  Component Value Date   WBC 9.1 11/28/2017   HGB 13.2 11/28/2017   HCT 39.0 11/28/2017   PLT 232 11/28/2017   GLUCOSE 221 (H) 11/28/2017   CHOL 156 05/14/2009   TRIG 226 (H) 05/14/2009   HDL 26 (L) 05/14/2009   LDLCALC 85 05/14/2009   ALT 25 11/28/2017   AST 23 11/28/2017   NA 136 11/28/2017   K 4.4 11/28/2017   CL 104 11/28/2017   CREATININE 0.89 11/28/2017   BUN 13 11/28/2017   CO2 23 11/28/2017   TSH 1.740 09/16/2017   PSA 0.11 01/21/2016   INR 1.06 11/28/2017   HGBA1C 7.0 (H) 11/28/2017   MICROALBUR 21.8 04/30/2016    Mikey BussingPeter Van Trigt III, MD 11/29/2017 7:23 AM

## 2017-11-29 NOTE — Op Note (Signed)
NAMAretta [name]:  Travis, [name]              ACCOUNT NO.:  [id number]  MEDICAL RECORD NO.:  [id number]  LOCATION:  [facility]                         FACILITY:  [facility]  PHYSICIAN:  Kerin PernaPeter Van Trigt, M.D.  DATE OF BIRTH:  10/18/1971  DATE OF PROCEDURE:  11/29/2017 DATE OF DISCHARGE:                              OPERATIVE REPORT   OPERATION: 1. Coronary artery bypass grafting x3 (left internal mammary artery to     LAD, saphenous vein graft to circumflex marginal, saphenous vein     graft to posterior descending). 2. Endoscopic harvest of right leg greater saphenous vein.  SURGEON:  Kerin PernaPeter Van Trigt, MD.  ASSISTANLowella Dandy:  Erin Barrett, PA-C.  ANESTHESIA:  General by Dr. Jairo Benarswell Jackson.  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  POSTOPERATIVE DIAGNOSES:  Severe three-vessel coronary artery disease, chronic occlusion of the left anterior descending, class 3 symptoms of angina and congestive heart failure, ischemic cardiomyopathy, ejection fraction of 30%.  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 He has had progressive symptoms of heart failure and fatigue and dyspnea on exertion.  He underwent repeat catheterization recently, which demonstrated chronic occlusion of the LAD, high-grade stenosis of a large obtuse marginal, high-grade stenosis of the distal RCA.  LVEDP was mildly elevated.  Echocardiogram showed no significant valvular disease with an apical akinetic area, possible aneurysm.  The patient was then referred for consideration of coronary bypass grafting for his severe coronary artery disease, reduced LV function, and class 3 symptoms of heart failure-angina.  I have examined the patient in the office  and reviewed the images from his cardiac catheterization and echocardiogram. I discussed the procedure of CABG for treatment of his coronary artery disease, LV dysfunction, and progressing symptoms of heart failure.  I discussed the details of surgery including the use of general anesthesia and cardiopulmonary bypass, the location of the surgical incisions, and the expected postoperative recovery.  I discussed with the patient risks to him of coronary artery bypass grafting including risk of stroke, bleeding requiring blood transfusion, postoperative pulmonary problems including pleural effusion, postoperative arrhythmias, postoperative organ failure, postoperative infection, and death.  After reviewing these issues, he demonstrated his understanding and agreed to proceed with surgery under what I felt was an informed consent.  OPERATIVE FINDINGS: 1. Adequate targets in the OM and posterior descending region.  The     LAD was patent, but very thickened, but a 1-mm probe passed     proximally and distally. 2. No blood products required for the surgery. 3. Improved global LV function after separation from cardiopulmonary     bypass after revascularization.  OPERATIVE PROCEDURE:  The patient was brought to the operating room and placed supine on the operating table.  General anesthesia was induced under invasive hemodynamic monitoring.  A transesophageal echo probe was placed by the Anesthesia team, which confirmed the preoperative findings.  The patient was prepped and draped as a sterile field.  A proper time-out was performed.  A sternal  incision was made as the saphenous vein was harvested endoscopically from the right leg.  The left internal mammary artery was harvested as a pedicle graft from its origin at the subclavian vessels.  The sternal retractor was placed and the pericardium was opened and suspended.  Purse-strings were placed in the ascending aorta and right atrium.   Heparin was administered and when the ACT was documented as being therapeutic, the patient was cannulated and placed on cardiopulmonary bypass.  The coronaries were identified for grafting and the mammary artery and vein grafts were prepared for the distal anastomoses.  Cardioplegia cannulas were placed both antegrade aortic and retrograde coronary sinus cardioplegia.  The patient was cooled to 32 degrees and aortic crossclamp was applied.  One liter of cold blood cardioplegia was delivered in split doses between the antegrade aortic and retrograde coronary sinus catheters.  There was good cardioplegic arrest and septal temperature dropped less than 12 degrees.  Cardioplegia was delivered every 20 minutes.  The distal coronary anastomoses were performed.  The first distal anastomosis was to the posterior descending branch of right coronary artery.  This had a proximal 80-90% stenosis.  A reversed saphenous vein was sewn end-to-side with running 7-0 Prolene with good flow through the graft.  Cardioplegia was redosed.  The second distal anastomosis was to the OM branch of the left circumflex.  This was a large 1.7-mm vessel proximal 80% stenosis.  A reversed saphenous vein was sewn end-to-side with running 7-0 Prolene with good flow through the graft.  Cardioplegia was redosed.  The third distal anastomosis was to the mid portion of the LAD.  The LAD was thickened, and a 1-mm probe passed proximally and distally.  An anastomosis between the left IMA and LAD was then fashioned using running 8-0 Prolene.  The bulldog on the mammary pedicle was briefly removed to demonstrate excellent flow through the anastomosis and then the bulldog was reapplied.  The pedicle was secured to the epicardium with 6-0 Prolenes and cardioplegia was redosed.  While crossclamp was still in place, 2 proximal vein anastomoses were performed on the ascending aorta using a 4.5-mm punch running 6-0 Prolene.  Prior  to tying down the final proximal anastomosis, air was vented from the coronaries with a dose of retrograde warm blood cardioplegia.  The crossclamp was removed.  The vein grafts were de-aired and opened.  Each had good flow and hemostasis was documented at the proximal and distal sites.  The patient was cardioverted back to a regular rhythm.  The patient was rewarmed and reperfused.  The cardioplegia cannulas were removed and pacing wires were applied.  The lungs were expanded and the ventilator was resumed. Low-dose inotropes were started.  The patient was then transitioned off cardiopulmonary bypass without difficulty.  Blood pressure and cardiac output were normal.  Echo showed improved global LV function.  Protamine was administered without adverse reaction.  The cannulas were removed. The mediastinum was irrigated.  The superior pericardial fat was closed over the grafts.  Anterior mediastinal and left pleural chest tubes were placed.  The sternum was closed with wire.  The patient remained stable.  The pectoralis fascia was closed with a running #1 Vicryl.  The subcutaneous and skin layers were closed with running Vicryl.  Total cardiopulmonary bypass time was 120 minutes.     Kerin PernaPeter Van Trigt, M.D.     PV/MEDQ  D:  11/29/2017  T:  11/29/2017  Job:  [id number]  cc:   Theotis Gerdeman M. [place], M.D.

## 2017-11-29 NOTE — Care Management Note (Signed)
Case Management Note Donn PieriniKristi Nahmir Zeidman RN, BSN Unit 4E-Case Manager-- 2H coverage [phone]  Patient Details  Name: Travis CabalMichael T [name] MRN: [id number] Date of Birth: 06/23/1971  Subjective/Objective:    Pt admitted s/p CABGx3                Action/Plan: PTA pt lived at home, independent- CM to follow for transition of care needs.   Expected Discharge Date:                  Expected Discharge Plan:  Home/Self Care  In-House Referral:     Discharge planning Services  CM Consult  Post Acute Care Choice:    Choice offered to:     DME Arranged:    DME Agency:     HH Arranged:    HH Agency:     Status of Service:  In process, will continue to follow  If discussed at Long Length of Stay Meetings, dates discussed:    Discharge Disposition:   Additional Comments:  Darrold SpanWebster, Allure Greaser Hall, RN 11/29/2017, 2:55 PM

## 2017-11-29 NOTE — Progress Notes (Signed)
  Echocardiogram Echocardiogram Transesophageal has been performed.  Janalyn HarderWest, Sissi Padia R 11/29/2017, 9:12 AM

## 2017-11-29 NOTE — Brief Op Note (Signed)
11/29/2017  11:39 AM  PATIENT:  Travis [name]  46 y.o. male  PRE-OPERATIVE DIAGNOSIS:  Coronary Artery Disease, Left Ventricular Aneurysm  POST-OPERATIVE DIAGNOSIS:  Coronary Artery Disease, No  Left Ventricular Aneurysm  PROCEDURE:  Procedure(s) : CORONARY ARTERY BYPASS GRAFTING x 3 -LIMA to LAD -SVG to CIRCUMFLEX -SVG to PDA  ENDOSCOPIC HARVEST GREATER SAPHENOUS VEIN -Right Thigh  TRANSESOPHAGEAL ECHOCARDIOGRAM (TEE) (N/A)  SURGEON:  Surgeon(s) and Role:    Kerin Perna* Van Trigt, Sota Hetz, MD - Primary  PHYSICIAN ASSISTANT: Lowella DandyErin Barrett PA-C  ANESTHESIA:   general  EBL:  300 cc   BLOOD ADMINISTERED:  CELLSAVER,  FFP and  PLTS  DRAINS: Left Pleural Chest Tubes, Mediastinal Chest Drains   LOCAL MEDICATIONS USED:  NONE  SPECIMEN:  No Specimen  DISPOSITION OF SPECIMEN:  N/A  COUNTS:  YES  TOURNIQUET:  * No tourniquets in log *  DICTATION: .Dragon Dictation  PLAN OF CARE: Admit to inpatient   PATIENT DISPOSITION:  ICU - intubated and hemodynamically stable.   Delay start of Pharmacological VTE agent (>24hrs) due to surgical blood loss or risk of bleeding: yes

## 2017-11-29 NOTE — H&P (Signed)
PCP is Wendee Beavers, DO Referring Provider is No ref. provider found  No chief complaint on file.   HPI: Patient examined, most recent coronary angiogram images and cardiac MRI images personally reviewed and counseled with patient and family 46 year old diabetic with ischemic cardiomyopathy and history of previous anterior MI 2-3 years ago presents for evaluation for CABG as recommended by his cardiologist. The patient was found to have significant heart disease last year  when he presented for cholecystectomy and a stress test was abnormal-EF 26%. 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 EF by MRI was 40%. the patient was treated medically with beta blocker, ARB, statin and aspirin. He has little clinical improvement with complaints of fatigue, dyspnea on exertion, occasional orthopnea. He did not have any significant valvular disease on the cardiac MRI. No further symptoms of gallbladder symptoms.  Earlier this month he underwent left right heart catheterization which demonstrated chronic occlusion of the LAD, normal LVEDP without ventriculogram, 80% stenosis of the distal RCA, 75% circumflex stenosis a large OM. Right heart cath showed normal PA pressures, normal CVP, cardiac index greater than 2.5 CABG was recommended. Patient has not had a 2-D echocardiogram. Last chest x-ray is clear.  The patient is a nonsmoker. The patient's diabetes is now under better control on Lantus insulin with A1c of 6.4. He has significant neuropathy and gastroparesis.  Year ago the patient developed necrotic toes of the left foot which required lesion under general anesthesia by Dr. Lajoyce Corners which he tolerated without difficulty.  Patient denies any resting symptoms. 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 He has established primary care with a [facility]  physician  Past Medical History:  Diagnosis Date  . Acute on chronic systolic and diastolic heart failure, NYHA class 3 (HCC) 09/14/2017  . Acute osteomyelitis of metatarsal bone of left foot (HCC) 11/12/2015  . CAD in native artery 09/14/2017  . Closed fracture of right tibial plateau 11/11/2015  . Depression with anxiety   . Diabetes mellitus    INSULIN DEPENDENT Type II  . Diabetic peripheral neuropathy associated with type 2 diabetes mellitus (HCC) 08/30/2008   Qualifier: Diagnosis of  By: Daphine Deutscher FNP, Zena Amos    . Diabetic ulcer of left foot (HCC) 11/14/2015  . Gastroparesis   . GERD (gastroesophageal reflux disease)   . Hypertension   . Left ventricular aneurysm   . Osteomyelitis (HCC)   . Osteoporosis 11/12/2015  . Peripheral neuropathy   . Shortness of breath dyspnea    with exertion  . Vitamin D deficiency 11/12/2015    Past Surgical History:  Procedure Laterality Date  . AMPUTATION Left 11/28/2015   Procedure: Left 4th Ray Amputation;  Surgeon: Nadara Mustard, MD;  Location: [facility] OR;  Service: Orthopedics;  Laterality: Left;  . AMPUTATION Left 10/29/2016   Procedure: LEFT 5TH RAY AMPUTATION;  Surgeon: Nadara Mustard, MD;  Location: [facility] OR;  Service: Orthopedics;  Laterality: Left;  . ANTERIOR VITRECTOMY Left 04/12/2016   Procedure: ANTERIOR CHAMBER WASH OUT WITH GAS FLUID EXCHANGE;  Surgeon: Carmela Rima, MD;  Location: [facility] OR;  Service: Ophthalmology;  Laterality: Left;  . CARDIAC CATHETERIZATION    . CATARACT EXTRACTION Left   . EYE SURGERY     left eye surgery 04/2016  . KNEE SURGERY Left   . RIGHT/LEFT HEART CATH AND CORONARY ANGIOGRAPHY N/A 10/19/2017  Procedure: RIGHT/LEFT HEART CATH AND CORONARY ANGIOGRAPHY;  Surgeon: SwazilandJordan, [name], MD;  [facility];  Service: Cardiovascular;  Laterality: N/A;  . SHOULDER SURGERY Right   . TOE AMPUTATION Left 11/28/15   4th toe    Family History  Problem Relation Age of Onset  . Cancer Paternal Grandmother   .  COPD Mother   . Heart failure Mother   . Anxiety disorder Mother   . Depression Mother   . Fibromyalgia Mother   . Esophageal cancer Father        Smoker, still does  . Diabetes Sister   . Fibromyalgia Sister   . Diabetes Maternal Uncle   . Diabetes Maternal Grandmother   . GER disease Sister   . Liver disease Paternal Grandfather   . Hemachromatosis Paternal Grandfather   . Diabetes Maternal Grandfather     Social History Social History   Tobacco Use  . Smoking status: Never Smoker  . Smokeless tobacco: Never Used  Substance Use Topics  . Alcohol use: No  . Drug use: No    Current Facility-Administered Medications  Medication Dose Route Frequency Provider Last Rate Last Dose  . 0.45 % sodium chloride infusion   Intravenous Continuous PRN Barrett, Erin R, PA-C      . [START ON 11/30/2017] 0.9 %  sodium chloride infusion  250 mL Intravenous Continuous Barrett, Erin R, PA-C      . 0.9 %  sodium chloride infusion   Intravenous Continuous Barrett, Erin R, PA-C 10 mL/hr at 11/29/17 1437    . [START ON 11/30/2017] acetaminophen (TYLENOL) tablet 1,000 mg  1,000 mg Oral Q6H Barrett, Erin R, PA-C       Or  . [START ON 11/30/2017] acetaminophen (TYLENOL) solution 1,000 mg  1,000 mg Per Tube Q6H Barrett, Erin R, PA-C      . acetaminophen (TYLENOL) solution 650 mg  650 mg Per Tube Once Barrett, Erin R, PA-C       Or  . acetaminophen (TYLENOL) suppository 650 mg  650 mg Rectal Once Barrett, Erin R, PA-C      . albumin human 5 % solution 250 mL  250 mL Intravenous Q15 min PRN Barrett, Erin R, PA-C      . [START ON 11/30/2017] aspirin EC tablet 325 mg  325 mg Oral Daily Barrett, Erin R, PA-C       Or  . [START ON 11/30/2017] aspirin chewable tablet 324 mg  324 mg Per Tube Daily Barrett, Erin R, PA-C      . [START ON 11/30/2017] bisacodyl (DULCOLAX) EC tablet 10 mg  10 mg Oral Daily Barrett, Erin R, PA-C       Or  . [START ON 11/30/2017] bisacodyl (DULCOLAX) suppository 10 mg  10 mg  Rectal Daily Barrett, Erin R, PA-C      . cefUROXime (ZINACEF) 1.5 g in dextrose 5 % 50 mL IVPB  1.5 g Intravenous Q12H Barrett, Erin R, PA-C      . dexmedetomidine (PRECEDEX) 200 mcg in sodium chloride 0.9 % 50 mL (4 mcg/mL) infusion  0-0.7 mcg/kg/hr Intravenous Continuous Barrett, Erin R, PA-C 12.8 mL/hr at 11/29/17 1435 0.5 mcg/kg/hr at 11/29/17 1435  . [START ON 11/30/2017] docusate sodium (COLACE) capsule 200 mg  200 mg Oral Daily Barrett, Erin R, PA-C      . DOPamine (INTROPIN) 800 mg in dextrose 5 % 250 mL (3.2 mg/mL) infusion  0-10 mcg/kg/min Intravenous Titrated Barrett, Erin R, PA-C   Stopped at  11/29/17 1432  . famotidine (PEPCID) IVPB 20 mg premix  20 mg Intravenous Q12H Barrett, Erin R, PA-C   Stopped at 11/29/17 1507  . [START ON 11/30/2017] famotidine (PEPCID) tablet 20 mg  20 mg Oral BID Barrett, Erin R, PA-C      . gabapentin (NEURONTIN) capsule 800 mg  800 mg Oral TID Barrett, Erin R, PA-C      . insulin regular (NOVOLIN R,HUMULIN R) 100 Units in sodium chloride 0.9 % 100 mL (1 Units/mL) infusion   Intravenous Continuous Barrett, Erin R, PA-C 1 mL/hr at 11/29/17 1335 1 Units/hr at 11/29/17 1335  . insulin regular bolus via infusion 0-10 Units  0-10 Units Intravenous TID WC Barrett, Erin R, PA-C      . lactated ringers infusion 500 mL  500 mL Intravenous Once PRN Barrett, Erin R, PA-C      . lactated ringers infusion   Intravenous Continuous Barrett, Erin R, PA-C 10 mL/hr at 11/29/17 1440    . lactated ringers infusion   Intravenous Continuous Barrett, Erin R, PA-C 10 mL/hr at 11/29/17 1439    . magnesium sulfate IVPB 4 g 100 mL  4 g Intravenous Once Barrett, Erin R, PA-C 20 mL/hr at 11/29/17 1437 4 g at 11/29/17 1437  . metoCLOPramide (REGLAN) injection 10 mg  10 mg Intravenous Q6H Barrett, Erin R, PA-C   10 mg at 11/29/17 1443  . metoprolol tartrate (LOPRESSOR) tablet 12.5 mg  12.5 mg Oral BID Barrett, Erin R, PA-C       Or  . metoprolol tartrate (LOPRESSOR) 25 mg/10 mL oral  suspension 12.5 mg  12.5 mg Per Tube BID Barrett, Erin R, PA-C      . metoprolol tartrate (LOPRESSOR) injection 2.5-5 mg  2.5-5 mg Intravenous Q2H PRN Barrett, Erin R, PA-C      . midazolam (VERSED) injection 2 mg  2 mg Intravenous Q1H PRN Barrett, Erin R, PA-C      . milrinone (PRIMACOR) 20 MG/100 ML (0.2 mg/mL) infusion  0.25 mcg/kg/min Intravenous Continuous Donata Clay, Theron Arista, MD 7.7 mL/hr at 11/29/17 1431 0.25 mcg/kg/min at 11/29/17 1431  . morphine 4 MG/ML injection 1-4 mg  1-4 mg Intravenous Q1H PRN Kerin Perna, MD      . morphine 4 MG/ML injection 2-5 mg  2-5 mg Intravenous Q1H PRN Donata Clay, Theron Arista, MD      . nitroGLYCERIN 50 mg in dextrose 5 % 250 mL (0.2 mg/mL) infusion  0-100 mcg/min Intravenous Titrated Barrett, Erin R, PA-C   Stopped at 11/29/17 1335  . ondansetron (ZOFRAN) injection 4 mg  4 mg Intravenous Q6H PRN Barrett, Erin R, PA-C      . oxyCODONE (Oxy IR/ROXICODONE) immediate release tablet 5-10 mg  5-10 mg Oral Q3H PRN Barrett, Erin R, PA-C      . phenylephrine (NEO-SYNEPHRINE) 20 mg in sodium chloride 0.9 % 250 mL (0.08 mg/mL) infusion  0-100 mcg/min Intravenous Titrated Barrett, Erin R, PA-C 7.5 mL/hr at 11/29/17 1431 10 mcg/min at 11/29/17 1431  . potassium chloride 10 mEq in 50 mL *CENTRAL LINE* IVPB  10 mEq Intravenous Q1 Hr x 3 Barrett, Erin R, PA-C      . rosuvastatin (CRESTOR) tablet 40 mg  40 mg Oral Daily Barrett, Erin R, PA-C      . [START ON 11/30/2017] sodium chloride flush (NS) 0.9 % injection 3 mL  3 mL Intravenous Q12H Barrett, Erin R, PA-C      . [START ON 11/30/2017] sodium chloride flush (NS)  0.9 % injection 3 mL  3 mL Intravenous PRN Barrett, Erin R, PA-C      . traMADol (ULTRAM) tablet 50-100 mg  50-100 mg Oral Q4H PRN Barrett, Erin R, PA-C      . vancomycin (VANCOCIN) IVPB 1000 mg/200 mL premix  1,000 mg Intravenous Once Barrett, Erin R, PA-C        Allergies  Allergen Reactions  . No Known Allergies     Review of Systems        Review of Systems  :  [ y ] = yes, [  ] = no        General :  Weight gain [   ]    Weight loss  [   ]  Fatigue [ y ]  Fever [  ]  Chills  [  ]                                Weakness  [  ]           HEENT    Headache [  ]  Dizziness [  ]  Blurred vision [  ] Glaucoma  [  ]                          Nosebleeds [  ] Painful or loose teeth [  ]        Cardiac :  Chest pain/ pressure [  ]  Resting SOB [  ] exertional SOB [ y ]                        Orthopnea [ y ]  Pedal edema  [  ]  Palpitations [  ] Syncope/presyncope [ ]                         Paroxysmal nocturnal dyspnea [  ]         Pulmonary : cough [  ]  wheezing [  ]  Hemoptysis [  ] Sputum [  ] Snoring [  ]                              Pneumothorax [  ]  Sleep apnea [  ]        GI : Vomiting [  ]  Dysphagia [  ]  Melena  [  ]  Abdominal pain [  ] BRBPR [  ]              Heart burn [  ]  Constipation [  ] Diarrhea  [  ] Colonoscopy [   ]        GU : Hematuria [  ]  Dysuria [  ]  Nocturia [  ] UTI's [  ]        Vascular : Claudication [  ]  Rest pain [  ]  DVT [  ] Vein stripping [  ] leg ulcers [  ]                          TIA [  ] Stroke [  ]  Varicose veins [  ]        NEURO :  Headaches  [  ]  Seizures [  ] Vision changes Cove.Etienne[y  ] Paresthesias [  ]                                       Seizures [  ]        Musculoskeletal :  Arthritis [y] Gout  [  ]  Back pain [  ]  Joint pain [  ]        Skin :  Rash [  ]  Melanoma [  ] Sores [  ]        Heme : Bleeding problems [  ]Clotting Disorders [  ] Anemia [  ]Blood Transfusion [ ]         Endocrine : Diabetes [ y] Heat or Cold intolerance [  ] Polyuria [  ]excessive thirst [ ]  neuropathy-numb feet        Psych : Depression [  ]  Anxiety [  ]  Psych hospitalizations [  ] Memory change [  ]                                               BP (!) 109/52   Pulse 80   Temp 98.6 F (37 C) (Oral)   Resp 12   Ht 6\' 3"  (1.905 m)   Wt 225 lb (102.1 kg)   SpO2 100%   BMI 28.12 kg/m  Physical Exam      Physical Exam  General: Chronically ill-appearing middle-aged male no acute distress HEENT: Normocephalic pupils equal , dentition suboptimal Neck: Supple without JVD, adenopathy, or bruit Chest: Clear to auscultation, symmetrical breath sounds, no rhonchi, no tenderness             or deformity Cardiovascular: Regular rate and rhythm, no murmur, no gallop, peripheral pulses             palpable in all extremities Abdomen:  Soft, nontender, no palpable mass or organomegaly Extremities: Warm, well-perfused, no clubbing cyanosis edema or tenderness,              no venous stasis changes of the legs Rectal/GU: Deferred Neuro: Grossly non--focal and symmetrical throughout Skin: Clean and dry without rash or ulceration   Diagnostic Tests: Severe three-vessel CAD with reduced LV function and preserved right heart pressures and cardiac output. 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 Mild MR by cardiac MRI but echocardiogram is pending.  Impression: 46 year old diabetic with severe three-vessel CAD and reduced LV function. He has exertional symptoms of shortness of breath exercise tolerance. 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 He would benefit from multivessel CABG with possible repair of LV apical aneurysm.  Plan:[facility] I discussed the details of the surgery with the patient and his sister including the expected benefits, alternatives, expected recovery, and potential risks. He demonstrates his understanding and agrees to proceed with surgery.   Mikey BussingPeter Van Trigt III, MD [facility] [phone]

## 2017-11-29 NOTE — Anesthesia Procedure Notes (Signed)
Arterial Line Insertion Start/End12/18/2018 7:00 AM, 11/29/2017 7:00 AM Performed by: attending  Patient location: Pre-op. Preanesthetic checklist: patient identified, IV checked, site marked, risks and benefits discussed, surgical consent, monitors and equipment checked, pre-op evaluation, timeout performed and anesthesia consent Lidocaine 1% used for infiltration Right, radial was placed Catheter size: 20 Fr Hand hygiene performed  and maximum sterile barriers used   Attempts: 1 Procedure performed without using ultrasound guided technique. Following insertion, dressing applied. Post procedure assessment: normal and unchanged

## 2017-11-30 ENCOUNTER — Inpatient Hospital Stay (HOSPITAL_COMMUNITY): Payer: Medicaid Other

## 2017-11-30 ENCOUNTER — Encounter (HOSPITAL_COMMUNITY): Payer: Self-pay | Admitting: Cardiothoracic Surgery

## 2017-11-30 ENCOUNTER — Other Ambulatory Visit: Payer: Self-pay

## 2017-11-30 LAB — POCT I-STAT, CHEM 8
BUN: 18 mg/dL (ref 6–20)
Calcium, Ion: 1.19 mmol/L (ref 1.15–1.40)
Chloride: 100 mmol/L — ABNORMAL LOW (ref 101–111)
Creatinine, Ser: 0.9 mg/dL (ref 0.61–1.24)
Glucose, Bld: 186 mg/dL — ABNORMAL HIGH (ref 65–99)
HCT: 30 % — ABNORMAL LOW (ref 39.0–52.0)
Hemoglobin: 10.2 g/dL — ABNORMAL LOW (ref 13.0–17.0)
Potassium: 4.3 mmol/L (ref 3.5–5.1)
Sodium: 137 mmol/L (ref 135–145)
TCO2: 23 mmol/L (ref 22–32)

## 2017-11-30 LAB — GLUCOSE, CAPILLARY
Glucose-Capillary: 106 mg/dL — ABNORMAL HIGH (ref 65–99)
Glucose-Capillary: 107 mg/dL — ABNORMAL HIGH (ref 65–99)
Glucose-Capillary: 107 mg/dL — ABNORMAL HIGH (ref 65–99)
Glucose-Capillary: 111 mg/dL — ABNORMAL HIGH (ref 65–99)
Glucose-Capillary: 114 mg/dL — ABNORMAL HIGH (ref 65–99)
Glucose-Capillary: 118 mg/dL — ABNORMAL HIGH (ref 65–99)
Glucose-Capillary: 124 mg/dL — ABNORMAL HIGH (ref 65–99)
Glucose-Capillary: 127 mg/dL — ABNORMAL HIGH (ref 65–99)
Glucose-Capillary: 136 mg/dL — ABNORMAL HIGH (ref 65–99)
Glucose-Capillary: 143 mg/dL — ABNORMAL HIGH (ref 65–99)
Glucose-Capillary: 144 mg/dL — ABNORMAL HIGH (ref 65–99)
Glucose-Capillary: 160 mg/dL — ABNORMAL HIGH (ref 65–99)
Glucose-Capillary: 174 mg/dL — ABNORMAL HIGH (ref 65–99)

## 2017-11-30 LAB — POCT I-STAT 3, ART BLOOD GAS (G3+)
Acid-base deficit: 5 mmol/L — ABNORMAL HIGH (ref 0.0–2.0)
Bicarbonate: 21 mmol/L (ref 20.0–28.0)
O2 Saturation: 98 %
Patient temperature: 37.4
TCO2: 22 mmol/L (ref 22–32)
pCO2 arterial: 40.8 mmHg (ref 32.0–48.0)
pH, Arterial: 7.322 — ABNORMAL LOW (ref 7.350–7.450)
pO2, Arterial: 110 mmHg — ABNORMAL HIGH (ref 83.0–108.0)

## 2017-11-30 LAB — CBC
HCT: 28.7 % — ABNORMAL LOW (ref 39.0–52.0)
HEMATOCRIT: 27.9 % — AB (ref 39.0–52.0)
Hemoglobin: 9.6 g/dL — ABNORMAL LOW (ref 13.0–17.0)
Hemoglobin: 9.5 g/dL — ABNORMAL LOW (ref 13.0–17.0)
MCH: 28.7 pg (ref 26.0–34.0)
MCH: 29.5 pg (ref 26.0–34.0)
MCHC: 33.4 g/dL (ref 30.0–36.0)
MCHC: 34.1 g/dL (ref 30.0–36.0)
MCV: 85.9 fL (ref 78.0–100.0)
MCV: 86.6 fL (ref 78.0–100.0)
PLATELETS: 188 10*3/uL (ref 150–400)
PLATELETS: 172 10*3/uL (ref 150–400)
RBC: 3.34 MIL/uL — AB (ref 4.22–5.81)
RBC: 3.22 MIL/uL — AB (ref 4.22–5.81)
RDW: 13.4 % (ref 11.5–15.5)
RDW: 13.6 % (ref 11.5–15.5)
WBC: 16 10*3/uL — ABNORMAL HIGH (ref 4.0–10.5)
WBC: 15.7 10*3/uL — AB (ref 4.0–10.5)

## 2017-11-30 LAB — CREATININE, SERUM: Creatinine, Ser: 1.02 mg/dL (ref 0.61–1.24)

## 2017-11-30 LAB — BASIC METABOLIC PANEL
ANION GAP: 8 (ref 5–15)
BUN: 16 mg/dL (ref 6–20)
CO2: 22 mmol/L (ref 22–32)
Calcium: 8 mg/dL — ABNORMAL LOW (ref 8.9–10.3)
Chloride: 105 mmol/L (ref 101–111)
Creatinine, Ser: 0.93 mg/dL (ref 0.61–1.24)
GFR calc Af Amer: >60 mL/min (ref >60)
GLUCOSE: 113 mg/dL — AB (ref 65–99)
POTASSIUM: 3.9 mmol/L (ref 3.5–5.1)
Sodium: 135 mmol/L (ref 135–145)

## 2017-11-30 LAB — COOXEMETRY PANEL
Carboxyhemoglobin: 1 % (ref 0.5–1.5)
Methemoglobin: 1.7 % — ABNORMAL HIGH (ref 0.0–1.5)
O2 Saturation: 76.4 %
Total hemoglobin: 9.7 g/dL — ABNORMAL LOW (ref 12.0–16.0)

## 2017-11-30 LAB — MAGNESIUM
MAGNESIUM: 2.1 mg/dL (ref 1.7–2.4)
Magnesium: 2.3 mg/dL (ref 1.7–2.4)

## 2017-11-30 MED ORDER — SODIUM CHLORIDE 0.9% FLUSH: 10.0000 mL | INTRAVENOUS | Status: DC | PRN

## 2017-11-30 MED ORDER — CHLORHEXIDINE GLUCONATE CLOTH 2 % EX PADS
6.0000 | MEDICATED_PAD | Freq: Every day | CUTANEOUS | Status: DC
  Administered 2017-11-30 – 2017-12-01 (×2): 6 via TOPICAL

## 2017-11-30 MED ORDER — FENTANYL CITRATE (PF) 100 MCG/2ML IJ SOLN: 50.0000 ug | INTRAMUSCULAR | Status: DC | PRN

## 2017-11-30 MED ORDER — INSULIN DETEMIR 100 UNIT/ML ~~LOC~~ SOLN
18.0000 [IU] | Freq: Two times a day (BID) | SUBCUTANEOUS | Status: DC
  Administered 2017-11-30 – 2017-12-04 (×8): 18 [IU] via SUBCUTANEOUS
  Filled 2017-11-30 (×9): qty 0.18

## 2017-11-30 MED ORDER — SODIUM CHLORIDE 0.9% FLUSH
10.0000 mL | Freq: Two times a day (BID) | INTRAVENOUS | Status: DC
  Administered 2017-11-30 – 2017-12-01 (×2): 10 mL

## 2017-11-30 MED ORDER — GABAPENTIN 600 MG PO TABS
2400.0000 mg | ORAL_TABLET | Freq: Every day | ORAL | Status: DC
  Administered 2017-11-30 – 2017-12-03 (×4): 2400 mg via ORAL
  Filled 2017-11-30 (×4): qty 4

## 2017-11-30 MED ORDER — INSULIN ASPART 100 UNIT/ML ~~LOC~~ SOLN
0.0000 [IU] | SUBCUTANEOUS | Status: DC
  Administered 2017-11-30: 2 [IU] via SUBCUTANEOUS
  Administered 2017-11-30 (×2): 4 [IU] via SUBCUTANEOUS
  Administered 2017-12-01: 2 [IU] via SUBCUTANEOUS
  Administered 2017-12-01: 4 [IU] via SUBCUTANEOUS
  Administered 2017-12-01 – 2017-12-02 (×3): 2 [IU] via SUBCUTANEOUS

## 2017-11-30 MED ORDER — PROMETHAZINE HCL 25 MG/ML IJ SOLN
12.5000 mg | Freq: Four times a day (QID) | INTRAMUSCULAR | Status: DC | PRN
  Administered 2017-11-30: 12.5 mg via INTRAVENOUS
  Filled 2017-11-30 (×2): qty 1

## 2017-11-30 MED FILL — Mannitol IV Soln 20%: INTRAVENOUS | Qty: 500 | Status: AC

## 2017-11-30 MED FILL — Sodium Bicarbonate IV Soln 8.4%: INTRAVENOUS | Qty: 50 | Status: AC

## 2017-11-30 MED FILL — Lidocaine HCl IV Inj 20 MG/ML: INTRAVENOUS | Qty: 5 | Status: AC

## 2017-11-30 MED FILL — Sodium Chloride IV Soln 0.9%: INTRAVENOUS | Qty: 2000 | Status: AC

## 2017-11-30 MED FILL — Electrolyte-R (PH 7.4) Solution: INTRAVENOUS | Qty: 4000 | Status: AC

## 2017-11-30 MED FILL — Heparin Sodium (Porcine) Inj 1000 Unit/ML: INTRAMUSCULAR | Qty: 10 | Status: AC

## 2017-11-30 NOTE — Progress Notes (Signed)
1 Day Post-Op Procedure(s) (LRB): CORONARY ARTERY BYPASS GRAFTING (CABG) times three using the left saphaneous vien. harvested endoscopicly and left internal mammary artery. (N/A) TRANSESOPHAGEAL ECHOCARDIOGRAM (TEE) (N/A) Subjective: C/o nausea Stable hemodynamics Objective: Vital signs in last 24 hours: Temp:  [97.3 F (36.3 C)-100 F (37.8 C)] 99.3 F (37.4 C) (12/19 0900) Pulse Rate:  [77-91] 87 (12/19 0900) Cardiac Rhythm: Normal sinus rhythm (12/19 0800) Resp:  [0-18] 9 (12/19 0900) BP: (74-118)/(52-77) 118/62 (12/19 0500) SpO2:  [99 %-100 %] 100 % (12/19 0900) Arterial Line BP: (94-161)/(44-75) 131/50 (12/19 0900) FiO2 (%):  [40 %-50 %] 40 % (12/18 1717) Weight:  [230 lb 6.1 oz (104.5 kg)] 230 lb 6.1 oz (104.5 kg) (12/19 0500)  Hemodynamic parameters for last 24 hours: PAP: (15-30)/(6-18) 16/7 CO:  [4.3 L/min-7.7 L/min] 7.7 L/min CI:  [1.9 L/min/m2-3.8 L/min/m2] 3.8 L/min/m2  Intake/Output from previous day: 12/18 0701 - 12/19 0700 In: 4181.2 [I.V.:3156.2; Blood:525; IV Piggyback:500] Out: 3190 [Urine:1630; Blood:1050; Chest Tube:510] Intake/Output this shift: Total I/O In: 92.4 [I.V.:92.4] Out: 130 [Urine:100; Chest Tube:30]       Exam    General- alert and comfortable   Lungs- clear without rales, wheezes   Cor- regular rate and rhythm, no murmur , gallop   Abdomen- soft, non-tender   Extremities - warm, non-tender, minimal edema   Neuro- oriented, appropriate, no focal weakness   Lab Results: Recent Labs    11/29/17 1944 11/30/17 0434  WBC 13.5* 16.0*  HGB 9.8* 9.6*  HCT 28.7* 28.7*  PLT 149* 188   BMET:  Recent Labs    11/28/17 0931  11/29/17 1943 11/29/17 1944 11/30/17 0434  NA 136   < > 139  --  135  K 4.4   < > 4.2  --  3.9  CL 104   < > 102  --  105  CO2 23  --   --   --  22  GLUCOSE 221*   < > 136*  --  113*  BUN 13   < > 14  --  16  CREATININE 0.89   < > 0.70 0.85 0.93  CALCIUM 8.9  --   --   --  8.0*   < > = values in this  interval not displayed.    PT/INR:  Recent Labs    11/29/17 1330  LABPROT 17.7*  INR 1.47   ABG    Component Value Date/Time   PHART 7.322 (L) 11/30/2017 0433   HCO3 21.0 11/30/2017 0433   TCO2 22 11/30/2017 0433   ACIDBASEDEF 5.0 (H) 11/30/2017 0433   O2SAT 76.4 11/30/2017 0510   CBG (last 3)  Recent Labs    11/30/17 0517 11/30/17 0613 11/30/17 0704  GLUCAP 111* 127* 136*    Assessment/Plan: S/P Procedure(s) (LRB): CORONARY ARTERY BYPASS GRAFTING (CABG) times three using the left saphaneous vien. harvested endoscopicly and left internal mammary artery. (N/A) TRANSESOPHAGEAL ECHOCARDIOGRAM (TEE) (N/A) Mobilize Diuresis Diabetes control d/c tubes/lines See progression orders   LOS: 1 day    Travis [name] 11/30/2017

## 2017-11-30 NOTE — Progress Notes (Signed)
Patient ID: Katha CabalMichael T [name], male   DOB: 05/05/1971, 46 y.o.   MRN: [id number] EVENING ROUNDS NOTE :     [address]       [place], [id number]             [phone]                 1 Day Post-Op Procedure(s) (LRB): CORONARY ARTERY BYPASS GRAFTING (CABG) times three using the left saphaneous vien. harvested endoscopicly and left internal mammary artery. (N/A) TRANSESOPHAGEAL ECHOCARDIOGRAM (TEE) (N/A)  Total Length of Stay:  LOS: 1 day  BP 109/60   Pulse 87   Temp 98.7 F (37.1 C) (Oral)   Resp 12   Ht 6\' 3"  (1.905 m)   Wt 230 lb 6.1 oz (104.5 kg)   SpO2 100%   BMI 28.80 kg/m   .Intake/Output      12/19 0701 - 12/20 0700   I.V. (mL/kg) 438.6 (4.2)   Blood    IV Piggyback    Total Intake(mL/kg) 438.6 (4.2)   Urine (mL/kg/hr) 470 (0.4)   Blood    Chest Tube 30   Total Output 500   Net -61.4         . sodium chloride    . sodium chloride 250 mL (11/30/17 1800)  . sodium chloride 10 mL/hr at 11/30/17 1800  . cefUROXime (ZINACEF)  IV Stopped (11/30/17 0935)  . DOPamine 2.5 mcg/kg/min (11/30/17 0700)  . lactated ringers    . lactated ringers 10 mL/hr at 11/30/17 1800  . lactated ringers 10 mL/hr at 11/30/17 1800  . milrinone 0.125 mcg/kg/min (11/30/17 1800)  . phenylephrine (NEO-SYNEPHRINE) Adult infusion Stopped (11/30/17 1744)     Lab Results  Component Value Date   WBC 15.7 (H) 11/30/2017   HGB 9.5 (L) 11/30/2017   HCT 27.9 (L) 11/30/2017   PLT 172 11/30/2017   GLUCOSE 186 (H) 11/30/2017   CHOL 156 05/14/2009   TRIG 226 (H) 05/14/2009   HDL 26 (L) 05/14/2009   LDLCALC 85 05/14/2009   ALT 25 11/28/2017   AST 23 11/28/2017   NA 137 11/30/2017   K 4.3 11/30/2017   CL 100 (L) 11/30/2017   CREATININE 1.02 11/30/2017   BUN 18 11/30/2017   CO2 22 11/30/2017   TSH 1.740 09/16/2017   PSA 0.11 01/21/2016   INR 1.47 11/29/2017   HGBA1C 7.0 (H) 11/28/2017   MICROALBUR 21.8 04/30/2016   Slow progress, walked 180 feet Progress slowed by  nausea, better now Still on dopamine and milrinone   Delight OvensEdward B Breland Trouten MD  Beeper [phone] Office [phone] 11/30/2017 7:45 PM

## 2017-11-30 NOTE — Anesthesia Postprocedure Evaluation (Signed)
Anesthesia Post Note  Patient: Katha CabalMichael T Tejera  Procedure(s) Performed: CORONARY ARTERY BYPASS GRAFTING (CABG) times three using the left saphaneous vien. harvested endoscopicly and left internal mammary artery. (N/A Chest) TRANSESOPHAGEAL ECHOCARDIOGRAM (TEE) (N/A )     Patient location during evaluation: SICU Anesthesia Type: General Level of consciousness: sedated, oriented and patient cooperative Pain management: pain level controlled Vital Signs Assessment: post-procedure vital signs reviewed and stable Respiratory status: spontaneous breathing, nonlabored ventilation, respiratory function stable and patient connected to nasal cannula oxygen Cardiovascular status: stable (remains on milrinone and phenylephrine infusions) Postop Assessment: no apparent nausea or vomiting and adequate PO intake Anesthetic complications: no    Last Vitals:  Vitals:   11/30/17 1200 11/30/17 1212  BP: (!) 102/56   Pulse: 86   Resp: (!) 21   Temp:  36.9 C  SpO2: 100%     Last Pain:  Vitals:   11/30/17 1212  TempSrc: Oral  PainSc:                  Alfonzia Woolum,E. Alyx Mcguirk

## 2017-12-01 ENCOUNTER — Inpatient Hospital Stay (HOSPITAL_COMMUNITY): Payer: Medicaid Other

## 2017-12-01 ENCOUNTER — Other Ambulatory Visit: Payer: Self-pay

## 2017-12-01 LAB — BASIC METABOLIC PANEL
Anion gap: 4 — ABNORMAL LOW (ref 5–15)
BUN: 19 mg/dL (ref 6–20)
CO2: 25 mmol/L (ref 22–32)
Calcium: 8.2 mg/dL — ABNORMAL LOW (ref 8.9–10.3)
Chloride: 105 mmol/L (ref 101–111)
Creatinine, Ser: 1.1 mg/dL (ref 0.61–1.24)
GFR calc Af Amer: >60 mL/min (ref >60)
GFR calc non Af Amer: >60 mL/min (ref >60)
Glucose, Bld: 110 mg/dL — ABNORMAL HIGH (ref 65–99)
Potassium: 4 mmol/L (ref 3.5–5.1)
Sodium: 134 mmol/L — ABNORMAL LOW (ref 135–145)

## 2017-12-01 LAB — GLUCOSE, CAPILLARY
Glucose-Capillary: 104 mg/dL — ABNORMAL HIGH (ref 65–99)
Glucose-Capillary: 133 mg/dL — ABNORMAL HIGH (ref 65–99)
Glucose-Capillary: 196 mg/dL — ABNORMAL HIGH (ref 65–99)
Glucose-Capillary: 131 mg/dL — ABNORMAL HIGH (ref 65–99)
Glucose-Capillary: 127 mg/dL — ABNORMAL HIGH (ref 65–99)
Glucose-Capillary: 111 mg/dL — ABNORMAL HIGH (ref 65–99)

## 2017-12-01 LAB — CBC
HCT: 25.9 % — ABNORMAL LOW (ref 39.0–52.0)
Hemoglobin: 8.9 g/dL — ABNORMAL LOW (ref 13.0–17.0)
MCH: 30 pg (ref 26.0–34.0)
MCHC: 34.4 g/dL (ref 30.0–36.0)
MCV: 87.2 fL (ref 78.0–100.0)
Platelets: 167 10*3/uL (ref 150–400)
RBC: 2.97 MIL/uL — ABNORMAL LOW (ref 4.22–5.81)
RDW: 13.5 % (ref 11.5–15.5)
WBC: 15.1 10*3/uL — ABNORMAL HIGH (ref 4.0–10.5)

## 2017-12-01 MED ORDER — FUROSEMIDE 10 MG/ML IJ SOLN
20.0000 mg | Freq: Two times a day (BID) | INTRAMUSCULAR | Status: DC
  Administered 2017-12-01 – 2017-12-02 (×4): 20 mg via INTRAVENOUS
  Filled 2017-12-01 (×4): qty 2

## 2017-12-01 MED FILL — Potassium Chloride Inj 2 mEq/ML: INTRAVENOUS | Qty: 40 | Status: AC

## 2017-12-01 MED FILL — Magnesium Sulfate Inj 50%: INTRAMUSCULAR | Qty: 10 | Status: AC

## 2017-12-01 MED FILL — Heparin Sodium (Porcine) Inj 1000 Unit/ML: INTRAMUSCULAR | Qty: 30 | Status: AC

## 2017-12-01 NOTE — Progress Notes (Signed)
[facility] BRIEF SICU PROGRESS NOTE  2 Days Post-Op  S/P Procedure(s) (LRB): CORONARY ARTERY BYPASS GRAFTING (CABG) times three using the left saphaneous vien. harvested endoscopicly and left internal mammary artery. (N/A) TRANSESOPHAGEAL ECHOCARDIOGRAM (TEE) (N/A)   Stable day NSR w/ stable BP Breathing comfortably on room air Diuresing well  Plan: Continue current plan  Travis Nailslarence H Tiyah Zelenak, MD 12/01/2017 7:15 PM

## 2017-12-01 NOTE — Progress Notes (Addendum)
      [address]       [place] [id number]             [phone]      2 Days Post-Op Procedure(s) (LRB): CORONARY ARTERY BYPASS GRAFTING (CABG) times three using the left saphaneous vien. harvested endoscopicly and left internal mammary artery. (N/A) TRANSESOPHAGEAL ECHOCARDIOGRAM (TEE) (N/A)   Subjective:  Feeling much better.  Denies N/V this morning.  He states he was able to eat breakfast this morning.  Objective: Vital signs in last 24 hours: Temp:  [98.5 F (36.9 C)-98.9 F (37.2 C)] 98.9 F (37.2 C) (12/20 0817) Pulse Rate:  [83-101] 92 (12/20 0800) Cardiac Rhythm: Normal sinus rhythm (12/20 0753) Resp:  [9-24] 17 (12/20 0800) BP: (92-126)/(47-65) 108/50 (12/20 0800) SpO2:  [96 %-100 %] 100 % (12/20 0800) Arterial Line BP: (114)/(48) 114/48 (12/19 1100) Weight:  [230 lb 9.6 oz (104.6 kg)] 230 lb 9.6 oz (104.6 kg) (12/20 0500)  Intake/Output from previous day: 12/19 0701 - 12/20 0700 In: 1247.2 [P.O.:360; I.V.:887.2] Out: 850 [Urine:820; Chest Tube:30] Intake/Output this shift: Total I/O In: 31 [I.V.:31] Out: -   General appearance: alert, cooperative and no distress Heart: regular rate and rhythm Lungs: clear to auscultation bilaterally Abdomen: soft, non-tender; bowel sounds normal; no masses,  no organomegaly Extremities: edema trace Wound: clean and dry  Lab Results: Recent Labs    11/30/17 1830 12/01/17 0355  WBC 15.7* 15.1*  HGB 9.5* 8.9*  HCT 27.9* 25.9*  PLT 172 167   BMET:  Recent Labs    11/30/17 0434 11/30/17 1824 11/30/17 1830 12/01/17 0355  NA 135 137  --  134*  K 3.9 4.3  --  4.0  CL 105 100*  --  105  CO2 22  --   --  25  GLUCOSE 113* 186*  --  110*  BUN 16 18  --  19  CREATININE 0.93 0.90 1.02 1.10  CALCIUM 8.0*  --   --  8.2*    PT/INR:  Recent Labs    11/29/17 1330  LABPROT 17.7*  INR 1.47   ABG    Component Value Date/Time   PHART 7.322 (L) 11/30/2017 0433   HCO3 21.0 11/30/2017 0433   TCO2 23 11/30/2017 1824   ACIDBASEDEF 5.0 (H) 11/30/2017 0433   O2SAT 76.4 11/30/2017 0510   CBG (last 3)  Recent Labs    11/30/17 2332 12/01/17 0335 12/01/17 0815  GLUCAP 143* 104* 133*    Assessment/Plan: S/P Procedure(s) (LRB): CORONARY ARTERY BYPASS GRAFTING (CABG) times three using the left saphaneous vien. harvested endoscopicly and left internal mammary artery. (N/A) TRANSESOPHAGEAL ECHOCARDIOGRAM (TEE) (N/A)  1. Cv- NSR, hemodynamically stable off all drips- continue Lopressor, BP too soft for ACE/ARB at this time 2. Pulm- not on oxygen, CXR with minimal left apical pneumothorax post chest tube removal, basilar atelectasis... Continue IS, will get 2V in AM 3. Renal- creatinine at 1.10, weight is trending down, continue IV lasix today, will transition to oral tablet tomorrow 4. Expected post operative blood loss anemia- mild Hgb at 8.9 5. DM- cbgs okay, continue insulin 6. Dispo- patient stable, no further nausea, tolerating diet, off all drips, will transfer to 4 E today   LOS: 2 days    Travis [name] 12/01/2017  patient examined and medical record reviewed,agree with above note. Travis [name] 12/01/2017

## 2017-12-01 NOTE — Plan of Care (Signed)
  Progressing Health Behavior/Discharge Planning: Ability to manage health-related needs will improve 12/01/2017 2300 - Progressing by [place] CalixGasque, Brigitta Pricer E, RN Clinical Measurements: Ability to maintain clinical measurements within normal limits will improve 12/01/2017 2300 - Progressing by [place] CalixGasque, Anyela Napierkowski E, RN Will remain free from infection 12/01/2017 2300 - Progressing by [place] CalixGasque, Gabriell Casimir E, RN Diagnostic test results will improve 12/01/2017 2300 - Progressing by [place] CalixGasque, Adilee Lemme E, RN Respiratory complications will improve 12/01/2017 2300 - Progressing by [place] CalixGasque, Shantavia Jha E, RN Cardiovascular complication will be avoided 12/01/2017 2300 - Progressing by [place] CalixGasque, Kemisha Bonnette E, RN Activity: Risk for activity intolerance will decrease 12/01/2017 2300 - Progressing by [place] CalixGasque, Eren Puebla E, RN Nutrition: Adequate nutrition will be maintained 12/01/2017 2300 - Progressing by [place] CalixGasque, [name] E, RN Coping: Level of anxiety will decrease 12/01/2017 2300 - Progressing by [place] CalixGasque, Stanislaus Kaltenbach E, RN Elimination: Will not experience complications related to urinary retention 12/01/2017 2300 - Progressing by [place] CalixGasque, Laketra Bowdish E, RN Pain Managment: General experience of comfort will improve 12/01/2017 2300 - Progressing by [place] CalixGasque, Myna Freimark E, RN Safety: Ability to remain free from injury will improve 12/01/2017 2300 - Progressing by [place] CalixGasque, Anyah Swallow E, RN Activity: Risk for activity intolerance will decrease 12/01/2017 2300 - Progressing by [place] CalixGasque, Natha Guin E, RN Clinical Measurements: Postoperative complications will be avoided or minimized 12/01/2017 2300 - Progressing by [place] CalixGasque, Johari Pinney E, RN Skin Integrity: Wound healing without signs and symptoms of infection 12/01/2017 2300 - Progressing by [place] CalixGasque, Akiba Melfi E, RN Risk for impaired skin integrity will decrease 12/01/2017 2300 - Progressing by [place] CalixGasque, Steffan Caniglia E, RN Urinary Elimination: Ability to achieve and maintain adequate renal perfusion and functioning will  improve 12/01/2017 2300 - Progressing by [place] CalixGasque, Sakia Schrimpf E, RN

## 2017-12-02 ENCOUNTER — Inpatient Hospital Stay (HOSPITAL_COMMUNITY): Payer: Medicaid Other

## 2017-12-02 LAB — CBC
HCT: 24.4 % — ABNORMAL LOW (ref 39.0–52.0)
Hemoglobin: 8.3 g/dL — ABNORMAL LOW (ref 13.0–17.0)
MCH: 29.1 pg (ref 26.0–34.0)
MCHC: 34 g/dL (ref 30.0–36.0)
MCV: 85.6 fL (ref 78.0–100.0)
Platelets: 142 10*3/uL — ABNORMAL LOW (ref 150–400)
RBC: 2.85 MIL/uL — ABNORMAL LOW (ref 4.22–5.81)
RDW: 13.1 % (ref 11.5–15.5)
WBC: 12 10*3/uL — ABNORMAL HIGH (ref 4.0–10.5)

## 2017-12-02 LAB — BASIC METABOLIC PANEL
Anion gap: 5 (ref 5–15)
BUN: 20 mg/dL (ref 6–20)
CO2: 26 mmol/L (ref 22–32)
Calcium: 8.1 mg/dL — ABNORMAL LOW (ref 8.9–10.3)
Chloride: 102 mmol/L (ref 101–111)
Creatinine, Ser: 1.18 mg/dL (ref 0.61–1.24)
GFR calc Af Amer: >60 mL/min (ref >60)
GFR calc non Af Amer: >60 mL/min (ref >60)
Glucose, Bld: 94 mg/dL (ref 65–99)
Potassium: 3.9 mmol/L (ref 3.5–5.1)
Sodium: 133 mmol/L — ABNORMAL LOW (ref 135–145)

## 2017-12-02 LAB — GLUCOSE, CAPILLARY
Glucose-Capillary: 73 mg/dL (ref 65–99)
Glucose-Capillary: 87 mg/dL (ref 65–99)
Glucose-Capillary: 91 mg/dL (ref 65–99)
Glucose-Capillary: 152 mg/dL — ABNORMAL HIGH (ref 65–99)
Glucose-Capillary: 112 mg/dL — ABNORMAL HIGH (ref 65–99)
Glucose-Capillary: 138 mg/dL — ABNORMAL HIGH (ref 65–99)

## 2017-12-02 NOTE — Plan of Care (Signed)
  Progressing Education: Knowledge of General Education information will improve 12/02/2017 2215 - Progressing by [place] CalixGasque, Folasade Mooty E, RN Health Behavior/Discharge Planning: Ability to manage health-related needs will improve 12/02/2017 2215 - Progressing by [place] CalixGasque, Wajiha Versteeg E, RN Clinical Measurements: Will remain free from infection 12/02/2017 2215 - Progressing by [place] CalixGasque, Fran Mcree E, RN Diagnostic test results will improve 12/02/2017 2215 - Progressing by [place] CalixGasque, Yeraldy Spike E, RN Respiratory complications will improve 12/02/2017 2215 - Progressing by [place] CalixGasque, Shawn Carattini E, RN Cardiovascular complication will be avoided 12/02/2017 2215 - Progressing by [place] Calix[facility]que, Akshar Starnes E, RN Activity: Risk for activity intolerance will decrease 12/02/2017 2215 - Progressing by [place] CalixGasque, Sahan Pen E, RN Nutrition: Adequate nutrition will be maintained 12/02/2017 2215 - Progressing by [place] CalixGasque, Akeela Busk E, RN Coping: Level of anxiety will decrease 12/02/2017 2215 - Progressing by [place] CalixGasque, Britnay Magnussen E, RN Elimination: Will not experience complications related to urinary retention 12/02/2017 2215 - Progressing by [place] CalixGasque, Ellouise Mcwhirter E, RN Pain Managment: General experience of comfort will improve 12/02/2017 2215 - Progressing by [place] CalixGasque, Gwenneth Whiteman E, RN Safety: Ability to remain free from injury will improve 12/02/2017 2215 - Progressing by [place] CalixGasque, Jamichael Knotts E, RN Skin Integrity: Risk for impaired skin integrity will decrease 12/02/2017 2215 - Progressing by [place] CalixGasque, Charmion Hapke E, RN Activity: Risk for activity intolerance will decrease 12/02/2017 2215 - Progressing by [place] CalixGasque, Dontell Mian E, RN Clinical Measurements: Postoperative complications will be avoided or minimized 12/02/2017 2215 - Progressing by [place] CalixGasque, Debrah Granderson E, RN Skin Integrity: Wound healing without signs and symptoms of infection 12/02/2017 2215 - Progressing by [place] CalixGasque, Alajiah Dutkiewicz E, RN Risk for impaired skin integrity will decrease 12/02/2017 2215 - Progressing by [place] CalixGasque, Trudie Cervantes  E, RN Urinary Elimination: Ability to achieve and maintain adequate renal perfusion and functioning will improve 12/02/2017 2215 - Progressing by [place] CalixGasque, Fitz Matsuo E, RN

## 2017-12-02 NOTE — Discharge Summary (Signed)
Physician Discharge Summary  Patient ID: Travis CabalMichael T [name] MRN: [id number] DOB/AGE: 46/04/1971 46 y.o.  Admit date: 11/29/2017 Discharge date: 12/04/2017  Admission Diagnoses:  Patient Active Problem List   Diagnosis Date Noted  . Anxiety with depression 09/16/2017  . Acute on chronic systolic and diastolic heart failure, NYHA class 3 (HCC) 09/14/2017  . CAD in native artery 09/14/2017  . 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 Short Achilles tendon (acquired), left ankle 11/19/2016  . Gastroparesis due to DM (HCC)   . History of diabetic ulcer of foot 11/14/2015  . Vitamin D deficiency 11/12/2015  . Osteoporosis 11/12/2015  . GERD (gastroesophageal reflux disease) 11/03/2015  . Erectile dysfunction 07/11/2009  . Allergic rhinitis 12/11/2008  . 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 Diabetes type 2, uncontrolled (HCC) 08/09/2007   Discharge Diagnoses:   Patient Active Problem List   Diagnosis Date Noted  . S/P CABG x 3 11/29/2017  . Anxiety with depression 09/16/2017  . Acute on chronic systolic and diastolic heart failure, NYHA class 3 (HCC) 09/14/2017  . CAD in native artery 09/14/2017  . 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 Short Achilles tendon (acquired), left ankle 11/19/2016  . Gastroparesis due to DM (HCC)   . History of diabetic ulcer of foot 11/14/2015  . Vitamin D deficiency 11/12/2015  . Osteoporosis 11/12/2015  . GERD (gastroesophageal reflux disease) 11/03/2015  . Erectile dysfunction 07/11/2009  . Allergic rhinitis 12/11/2008  . 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 Diabetes type 2, uncontrolled (HCC) 08/09/2007   Discharged Condition: good  History of Present Illness:  [name] is a 46 yo obese white male with known CAD.  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  While undergoing  preoperative workup for a cholecystectomy a year ago he was noted to have an abnormal stress test with a reduced EF of 26%.  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  There was evidence of limited viability in the apex, but the inferior septum was felt to be viable.  He was treated medically at that time.  Over the past year the patient has continued to feel poorly.  He continued to have complaints of fatigue, dyspnea on exertion, and occasional orthopnea.  He presented for follow up with Cardiology at which time it was felt cardiac catheterization would be indicated.  This was done and showed multivessel CAD and it was felt coronary bypass grafting would be indicated.  He was referred to Dr. Donata ClayVan Trigt for surgical evaluation.  It was felt the patient would benefit from coronary bypass grafting.  The risks and benefits of the procedure were explained to the patient and he was agreeable to proceed.        Hospital Course:   [facility].  He was taken to the operating room and underwent CABG x 3 utilizing LIMA to LAD, SVG to PDA, and SVG to distal circumflex.  He also underwent endoscopic harvest of greater saphenous vein from his right leg.  He tolerated the procedure without difficulty and was taken to the SICU in stable condition.  He was extubated the evening of surgery.  During his stay in the SICU the patient was weaned off all drips.  His chest tubes and arterial lines were removed without difficulty.  He developed post operative nausea which resolved within 24 hours.  He was diuresed with IV Lasix for hypervolemia.  He was maintaining NSR and ambulating in the SICU without difficulty.  He was transferred to the telemetry unit on 12/03/2017.  He continued to make progress.  He continued to maintain NSR without difficulty.  His pacing wires were removed prior to discharge.  His blood pressure remains labile and  his home cozaar could not be resumed.  He has some post operative anemia and was started on iron supplementation for this.  His blood sugars have been well controlled during hospitalization.  He is tolerating a diabetic diet.  He is ambulating independently.  He is medically stable for discharge home today.           Significant Diagnostic Studies: angiography:    LV end diastolic pressure is normal.  LV end diastolic pressure is normal.  Ost 1st Mrg to 1st Mrg lesion is 65% stenosed.  Ost LAD to Prox LAD lesion is 100% stenosed.  Prox RCA lesion is 50% stenosed.  Mid RCA lesion is 60% stenosed.  Dist RCA lesion is 80% stenosed.  Ost RPDA to RPDA lesion is 70% stenosed.   1. Severe 3 vessel obstructive CAD.    - 100% proximal LAD with left to left and right to left collaterals.    - 60-70% very large OM1    - 60% mid RCA, Long 80% distal RCA extending down into the PDA 2. Normal LVEDP 3. Normal right heart pressures and cardiac output.  Plan: patient has complex, multivessel CAD with LV dysfunction. Should consider revascularization with CABG in a longstanding diabetic.   Treatments: surgery:   1. Coronary artery bypass grafting x3 (left internal mammary artery to     LAD, saphenous vein graft to circumflex marginal, saphenous vein     graft to posterior descending). 2. Endoscopic harvest of right leg greater saphenous vein.  Disposition: 01-Home or Self Care   Discharge Medications:  The patient has been discharged on:   1.Beta Blocker:  Yes [ x  ]                              No   [   ]                              If No, reason:  2.Ace Inhibitor/ARB: Yes [   ]                                     No  [ x   ]                                     If No, reason: labile BP  3.Statin:   Yes [ x  ]                  No  [   ]                  If No, reason:  4.Ecasa:  Yes  [ x  ]                  No   [   ]  If No, reason:      Allergies as  of 12/04/2017      Reactions   No Known Allergies       Medication List    STOP taking these medications   losartan 25 MG tablet Commonly known as:  COZAAR     TAKE these medications   acetaminophen 500 MG tablet Commonly known as:  TYLENOL Take 2 tablets (1,000 mg total) by mouth every 6 (six) hours as needed for mild pain or fever.   aspirin 325 MG EC tablet Take 1 tablet (325 mg total) by mouth daily. What changed:    medication strength  how much to take   furosemide 40 MG tablet Commonly known as:  LASIX Take 1 tablet (40 mg total) by mouth daily. For 3 days   gabapentin 800 MG tablet Commonly known as:  NEURONTIN Take 1 tablet (800 mg total) by mouth 3 (three) times daily.   insulin detemir 100 UNIT/ML injection Commonly known as:  LEVEMIR Inject 0.2 mLs (20 Units total) into the skin at bedtime.   Iron 325 (65 Fe) MG Tabs Take 1 tablet (325 mg total) by mouth 2 (two) times daily after a meal.   metoCLOPramide 10 MG tablet Commonly known as:  REGLAN Take 1 tablet (10 mg total) by mouth 4 (four) times daily -  before meals and at bedtime.   metoprolol succinate 25 MG 24 hr tablet Commonly known as:  TOPROL XL Take 1 tablet (25 mg total) by mouth daily.   oxyCODONE 5 MG immediate release tablet Commonly known as:  Oxy IR/ROXICODONE Take 1-2 tablets (5-10 mg total) by mouth every 4 (four) hours as needed for severe pain.   ranitidine 150 MG tablet Commonly known as:  ZANTAC Take 1 tablet 2 (two) times daily by mouth.   rosuvastatin 40 MG tablet Commonly known as:  CRESTOR Take 1 tablet (40 mg total) by mouth daily.      Follow-up Information    Kerin Perna, MD Follow up on 01/04/2018.   Specialty:  Cardiothoracic Surgery Why:  Appointment is at 10:30, please get CXR at 10:00 at [facility] imaging located on first floor of our office building Contact information: [address] [place] [address] [phone]         Abelino Derrick, PA-C Follow up on 12/16/2017.   Specialties:  Cardiology, Radiology Why:  Appointment is at 11:00 Contact information: [address] [place] [address] [phone]           Signed: Lowella Dandy 12/04/2017, 8:32 AM

## 2017-12-02 NOTE — Discharge Instructions (Signed)
Coronary Artery Bypass Grafting, Care After °This sheet gives you information about how to care for yourself after your procedure. Your health care provider may also give you more specific instructions. If you have problems or questions, contact your health care provider. °What can I expect after the procedure? °After the procedure, it is common to have: °· Nausea and a lack of appetite. °· Constipation. °· Weakness and fatigue. °· Depression or irritability. °· Pain or discomfort in your incision areas. ° °Follow these instructions at home: °Medicines °· Take over-the-counter and prescription medicines only as told by your health care provider. Do not stop taking medicines or start any new medicines without approval from your health care provider. °· If you were prescribed an antibiotic medicine, take it as told by your health care provider. Do not stop taking the antibiotic even if you start to feel better. °· Do not drive or use heavy machinery while taking prescription pain medicine. °Incision care °· Follow instructions from your health care provider about how to take care of your incisions. Make sure you: °? Wash your hands with soap and water before you change your bandage (dressing). If soap and water are not available, use hand sanitizer. °? Change your dressing as told by your health care provider. °? Leave stitches (sutures), skin glue, or adhesive strips in place. These skin closures may need to stay in place for 2 weeks or longer. If adhesive strip edges start to loosen and curl up, you may trim the loose edges. Do not remove adhesive strips completely unless your health care provider tells you to do that. °· Keep incision areas clean, dry, and protected. °· Check your incision areas every day for signs of infection. Check for: °? More redness, swelling, or pain. °? More fluid or blood. °? Warmth. °? Pus or a bad smell. °· If incisions were made in your legs: °? Avoid crossing your legs. °? Avoid  sitting for long periods of time. Change positions every 30 minutes. °? Raise (elevate) your legs when you are sitting. °Bathing °· Do not take baths, swim, or use a hot tub until your health care provider approves. °· Only take sponge baths. Pat the incisions dry. Do not rub incisions with a washcloth or towel. °· Ask your health care provider when you can shower. °Eating and drinking °· Eat foods that are high in fiber, such as raw fruits and vegetables, whole grains, beans, and nuts. Meats should be lean cut. Avoid canned, processed, and fried foods. This can help prevent constipation and is a recommended part of a heart-healthy diet. °· Drink enough fluid to keep your urine clear or pale yellow. °· Limit alcohol intake to no more than 1 drink a day for nonpregnant women and 2 drinks a day for men. One drink equals 12 oz of beer, 5 oz of wine, or 1½ oz of hard liquor. °Activity °· Rest and limit your activity as told by your health care provider. You may be instructed to: °? Stop any activity right away if you have chest pain, shortness of breath, irregular heartbeats, or dizziness. Get help right away if you have any of these symptoms. °? Move around frequently for short periods or take short walks as directed by your health care provider. Gradually increase your activities. You may need physical therapy or cardiac rehabilitation to help strengthen your muscles and build your endurance. °? Avoid lifting, pushing, or pulling anything that is heavier than 10 lb (4.5 kg) for at   least 6 weeks or as told by your health care provider. °· Do not drive until your health care provider approves. °· Ask your health care provider when you may return to work. °· Ask your health care provider when you may resume sexual activity. °General instructions °· Do not use any products that contain nicotine or tobacco, such as cigarettes and e-cigarettes. If you need help quitting, ask your health care provider. °· Take 2-3 deep  breaths every few hours during the day, while you recover. This helps expand your lungs and prevent complications like pneumonia after surgery. °· If you were given a device called an incentive spirometer, use it several times a day to practice deep breathing. Support your chest with a pillow or your arms when you take deep breaths or cough. °· Wear compression stockings as told by your health care provider. These stockings help to prevent blood clots and reduce swelling in your legs. °· Weigh yourself every day. This helps identify if your body is holding (retaining) fluid that may make your heart and lungs work harder. °· Keep all follow-up visits as told by your health care provider. This is important. °Contact a health care provider if: °· You have more redness, swelling, or pain around any incision. °· You have more fluid or blood coming from any incision. °· Any incision feels warm to the touch. °· You have pus or a bad smell coming from any incision °· You have a fever. °· You have swelling in your ankles or legs. °· You have pain in your legs. °· You gain 2 lb (0.9 kg) or more a day. °· You are nauseous or you vomit. °· You have diarrhea. °Get help right away if: °· You have chest pain that spreads to your jaw or arms. °· You are short of breath. °· You have a fast or irregular heartbeat. °· You notice a "clicking" in your breastbone (sternum) when you move. °· You have numbness or weakness in your arms or legs. °· You feel dizzy or light-headed. °Summary °· After the procedure, it is common to have pain or discomfort in the incision areas. °· Do not take baths, swim, or use a hot tub until your health care provider approves. °· Gradually increase your activities. You may need physical therapy or cardiac rehabilitation to help strengthen your muscles and build your endurance. °· Weigh yourself every day. This helps identify if your body is holding (retaining) fluid that may make your heart and lungs work  harder. °This information is not intended to replace advice given to you by your health care provider. Make sure you discuss any questions you have with your health care provider. °Document Released: 06/18/2005 Document Revised: 10/18/2016 Document Reviewed: 10/18/2016 °Elsevier Interactive Patient Education © 2018 [employer] ° ° °Endoscopic Saphenous Vein Harvesting, Care After °Refer to this sheet in the next few weeks. These instructions provide you with information about caring for yourself after your procedure. Your health care provider may also give you more specific instructions. Your treatment has been planned according to current medical practices, but problems sometimes occur. Call your health care provider if you have any problems or questions after your procedure. °What can I expect after the procedure? °After the procedure, it is common to have: °· Pain. °· Bruising. °· Swelling. °· Numbness. ° °Follow these instructions at home: °Medicine °· Take over-the-counter and prescription medicines only as told by your health care provider. °· Do not drive or operate heavy machinery   while taking prescription pain medicine. °Incision care ° °· Follow instructions from your health care provider about how to take care of the cut made during surgery (incision). Make sure you: °? Wash your hands with soap and water before you change your bandage (dressing). If soap and water are not available, use hand sanitizer. °? Change your dressing as told by your health care provider. °? Leave stitches (sutures), skin glue, or adhesive strips in place. These skin closures may need to be in place for 2 weeks or longer. If adhesive strip edges start to loosen and curl up, you may trim the loose edges. Do not remove adhesive strips completely unless your health care provider tells you to do that. °· Check your incision area every day for signs of infection. Check for: °? More redness, swelling, or pain. °? More fluid or  blood. °? Warmth. °? Pus or a bad smell. °General instructions °· Raise (elevate) your legs above the level of your heart while you are sitting or lying down. °· Do any exercises your health care providers have given you. These may include deep breathing, coughing, and walking exercises. °· Do not shower, take baths, swim, or use a hot tub unless told by your health care provider. °· Wear your elastic stocking if told by your health care provider. °· Keep all follow-up visits as told by your health care provider. This is important. °Contact a health care provider if: °· Medicine does not help your pain. °· Your pain gets worse. °· You have new leg bruises or your leg bruises get bigger. °· You have a fever. °· Your leg feels numb. °· You have more redness, swelling, or pain around your incision. °· You have more fluid or blood coming from your incision. °· Your incision feels warm to the touch. °· You have pus or a bad smell coming from your incision. °Get help right away if: °· Your pain is severe. °· You develop pain, tenderness, warmth, redness, or swelling in any part of your leg. °· You have chest pain. °· You have trouble breathing. °This information is not intended to replace advice given to you by your health care provider. Make sure you discuss any questions you have with your health care provider. °Document Released: 08/11/2011 Document Revised: 05/06/2016 Document Reviewed: 10/13/2015 °Elsevier Interactive Patient Education © 2018 [employer] ° ° °

## 2017-12-02 NOTE — Progress Notes (Signed)
3 Days Post-Op Procedure(s) (LRB): CORONARY ARTERY BYPASS GRAFTING (CABG) times three using the left saphaneous vien. harvested endoscopicly and left internal mammary artery. (N/A) TRANSESOPHAGEAL ECHOCARDIOGRAM (TEE) (N/A) Subjective: Progressing after three-vessel CABG Waiting for bed on stepdown Patient will need to remain hospitalized until Monday since he lives alone  Objective: Vital signs in last 24 hours: Temp:  [98 F (36.7 C)-99.2 F (37.3 C)] 98.2 F (36.8 C) (12/21 1500) Pulse Rate:  [74-99] 81 (12/21 1400) Cardiac Rhythm: Normal sinus rhythm (12/21 0800) Resp:  [0-23] 19 (12/21 1300) BP: (82-137)/(31-89) 115/59 (12/21 1400) SpO2:  [83 %-99 %] 91 % (12/21 1400) Weight:  [226 lb 13.7 oz (102.9 kg)] 226 lb 13.7 oz (102.9 kg) (12/21 0500)  Hemodynamic parameters for last 24 hours:    Intake/Output from previous day: 12/20 0701 - 12/21 0700 In: 31 [I.V.:31] Out: 1075 [Urine:1075] Intake/Output this shift: Total I/O In: 120 [P.O.:120] Out: 700 [Urine:700]       Exam    General- alert and comfortable   Lungs- clear without rales, wheezes   Cor- regular rate and rhythm, no murmur , gallop   Abdomen- soft, non-tender   Extremities - warm, non-tender, minimal edema   Neuro- oriented, appropriate, no focal weakness   Lab Results: Recent Labs    12/01/17 0355 12/02/17 0227  WBC 15.1* 12.0*  HGB 8.9* 8.3*  HCT 25.9* 24.4*  PLT 167 142*   BMET:  Recent Labs    12/01/17 0355 12/02/17 0227  NA 134* 133*  K 4.0 3.9  CL 105 102  CO2 25 26  GLUCOSE 110* 94  BUN 19 20  CREATININE 1.10 1.18  CALCIUM 8.2* 8.1*    PT/INR: No results for input(s): LABPROT, INR in the last 72 hours. ABG    Component Value Date/Time   PHART 7.322 (L) 11/30/2017 0433   HCO3 21.0 11/30/2017 0433   TCO2 23 11/30/2017 1824   ACIDBASEDEF 5.0 (H) 11/30/2017 0433   O2SAT 76.4 11/30/2017 0510   CBG (last 3)  Recent Labs    12/02/17 0822 12/02/17 1242 12/02/17 1644   GLUCAP 87 91 152*    Assessment/Plan: S/P Procedure(s) (LRB): CORONARY ARTERY BYPASS GRAFTING (CABG) times three using the left saphaneous vien. harvested endoscopicly and left internal mammary artery. (N/A) TRANSESOPHAGEAL ECHOCARDIOGRAM (TEE) (N/A) Mobilize Diuresis Diabetes control Plan for transfer to step-down: see transfer orders   LOS: 3 days    Kathlee Nationseter Van Trigt III 12/02/2017

## 2017-12-02 NOTE — Progress Notes (Signed)
      [address]       [place] [id number]             [phone]      POD # 3 CABG  Resting comfortably  BP 100/63   Pulse 81   Temp 98.3 F (36.8 C) (Oral)   Resp 15   Ht 6\' 3"  (1.905 m)   Wt 226 lb 13.7 oz (102.9 kg)   SpO2 97%   BMI 28.35 kg/m   Awaiting bed on step down  Viviann SpareSteven C. Dorris FetchHendrickson, MD [facility] [phone]

## 2017-12-03 LAB — GLUCOSE, CAPILLARY
Glucose-Capillary: 87 mg/dL (ref 65–99)
Glucose-Capillary: 151 mg/dL — ABNORMAL HIGH (ref 65–99)
Glucose-Capillary: 134 mg/dL — ABNORMAL HIGH (ref 65–99)
Glucose-Capillary: 113 mg/dL — ABNORMAL HIGH (ref 65–99)
Glucose-Capillary: 138 mg/dL — ABNORMAL HIGH (ref 65–99)

## 2017-12-03 LAB — CBC
HCT: 22.6 % — ABNORMAL LOW (ref 39.0–52.0)
Hemoglobin: 7.7 g/dL — ABNORMAL LOW (ref 13.0–17.0)
MCH: 28.9 pg (ref 26.0–34.0)
MCHC: 34.1 g/dL (ref 30.0–36.0)
MCV: 85 fL (ref 78.0–100.0)
Platelets: 158 10*3/uL (ref 150–400)
RBC: 2.66 MIL/uL — ABNORMAL LOW (ref 4.22–5.81)
RDW: 13.1 % (ref 11.5–15.5)
WBC: 9.9 10*3/uL (ref 4.0–10.5)

## 2017-12-03 LAB — BASIC METABOLIC PANEL
Anion gap: 5 (ref 5–15)
BUN: 17 mg/dL (ref 6–20)
CO2: 28 mmol/L (ref 22–32)
Calcium: 7.9 mg/dL — ABNORMAL LOW (ref 8.9–10.3)
Chloride: 99 mmol/L — ABNORMAL LOW (ref 101–111)
Creatinine, Ser: 1.17 mg/dL (ref 0.61–1.24)
GFR calc Af Amer: >60 mL/min (ref >60)
GFR calc non Af Amer: >60 mL/min (ref >60)
Glucose, Bld: 104 mg/dL — ABNORMAL HIGH (ref 65–99)
Potassium: 3.4 mmol/L — ABNORMAL LOW (ref 3.5–5.1)
Sodium: 132 mmol/L — ABNORMAL LOW (ref 135–145)

## 2017-12-03 MED ORDER — INSULIN ASPART 100 UNIT/ML ~~LOC~~ SOLN
0.0000 [IU] | Freq: Three times a day (TID) | SUBCUTANEOUS | Status: DC
  Administered 2017-12-04: 2 [IU] via SUBCUTANEOUS

## 2017-12-03 MED ORDER — POTASSIUM CHLORIDE CRYS ER 20 MEQ PO TBCR
20.0000 meq | EXTENDED_RELEASE_TABLET | Freq: Three times a day (TID) | ORAL | Status: AC
  Administered 2017-12-03 (×3): 20 meq via ORAL
  Filled 2017-12-03 (×3): qty 1

## 2017-12-03 MED ORDER — FUROSEMIDE 40 MG PO TABS
40.0000 mg | ORAL_TABLET | Freq: Every day | ORAL | Status: DC
  Administered 2017-12-03 – 2017-12-04 (×2): 40 mg via ORAL
  Filled 2017-12-03 (×2): qty 1

## 2017-12-03 MED ORDER — INSULIN ASPART 100 UNIT/ML ~~LOC~~ SOLN
0.0000 [IU] | Freq: Every day | SUBCUTANEOUS | Status: DC
Start: 2017-12-03 — End: 2017-12-04

## 2017-12-03 NOTE — Progress Notes (Signed)
Cardiac Rehab Note: Cardiac Rehab RN in to walk with patient. Patient laying in bed with lights off. States he cannot possibly walk at this time. Frustrated that he has too much company and it is causing him exhaustion. Patient refused to walk but stated he would ambulate once he has napped. Will follow up with patient on Monday.

## 2017-12-03 NOTE — Accreditation Note (Signed)
AV pacing wires discontinued per protocol. Last INR 1.47 on 11/29/17. Patient instructed to remain in bed for 1 hour. Verbalized understanding.

## 2017-12-03 NOTE — Progress Notes (Signed)
Transferred to [id number] via wheelchair portable monitor on. No changes. [facility]

## 2017-12-03 NOTE — Progress Notes (Signed)
12/03/2017 1215 Received pt to room 4E-11 from 2H.  Pt is A&O, no C/O voiced.  Tele monitor applied and CCMD notified.  Oriented to room, call light and bed.  Call bell in reach. Kathryne HitchAllen, Taj Nevins C

## 2017-12-03 NOTE — Progress Notes (Signed)
4 Days Post-Op Procedure(s) (LRB): CORONARY ARTERY BYPASS GRAFTING (CABG) times three using the left saphaneous vien. harvested endoscopicly and left internal mammary artery. (N/A) TRANSESOPHAGEAL ECHOCARDIOGRAM (TEE) (N/A) Subjective: Some incisional pain  Objective: Vital signs in last 24 hours: Temp:  [97.8 F (36.6 C)-99.2 F (37.3 C)] 98.2 F (36.8 C) (12/22 0327) Pulse Rate:  [73-95] 83 (12/22 0700) Cardiac Rhythm: Normal sinus rhythm (12/22 0000) Resp:  [14-28] 16 (12/22 0700) BP: (85-125)/(49-69) 101/57 (12/22 0700) SpO2:  [88 %-99 %] 97 % (12/22 0700) Weight:  [227 lb 8.2 oz (103.2 kg)] 227 lb 8.2 oz (103.2 kg) (12/22 0527)  Hemodynamic parameters for last 24 hours:    Intake/Output from previous day: 12/21 0701 - 12/22 0700 In: 400 [P.O.:400] Out: 1475 [Urine:1475] Intake/Output this shift: No intake/output data recorded.  General appearance: alert, cooperative and no distress Neurologic: intact Heart: regular rate and rhythm Lungs: diminished breath sounds bibasilar Abdomen: normal findings: soft, non-tender  Lab Results: Recent Labs    12/02/17 0227 12/03/17 0254  WBC 12.0* 9.9  HGB 8.3* 7.7*  HCT 24.4* 22.6*  PLT 142* 158   BMET:  Recent Labs    12/02/17 0227 12/03/17 0254  NA 133* 132*  K 3.9 3.4*  CL 102 99*  CO2 26 28  GLUCOSE 94 104*  BUN 20 17  CREATININE 1.18 1.17  CALCIUM 8.1* 7.9*    PT/INR: No results for input(s): LABPROT, INR in the last 72 hours. ABG    Component Value Date/Time   PHART 7.322 (L) 11/30/2017 0433   HCO3 21.0 11/30/2017 0433   TCO2 23 11/30/2017 1824   ACIDBASEDEF 5.0 (H) 11/30/2017 0433   O2SAT 76.4 11/30/2017 0510   CBG (last 3)  Recent Labs    12/02/17 1937 12/02/17 2326 12/03/17 0326  GLUCAP 112* 138* 87    Assessment/Plan: S/P Procedure(s) (LRB): CORONARY ARTERY BYPASS GRAFTING (CABG) times three using the left saphaneous vien. harvested endoscopicly and left internal mammary artery.  (N/A) TRANSESOPHAGEAL ECHOCARDIOGRAM (TEE) (N/A) Plan for transfer to step-down: see transfer orders  Awaiting bed on 4 E CV- stable, dc pacing wires RESP- continue IS RENAL- hypokalemia- supplement, change to PO lasix ENDO- CBG well controlled, change CBG/SSI to AC/HS Continue cardiac rehab   LOS: 4 days    Travis [name] 12/03/2017

## 2017-12-04 LAB — GLUCOSE, CAPILLARY: Glucose-Capillary: 130 mg/dL — ABNORMAL HIGH (ref 65–99)

## 2017-12-04 LAB — BASIC METABOLIC PANEL
ANION GAP: 4 — AB (ref 5–15)
BUN: 11 mg/dL (ref 6–20)
CALCIUM: 8.1 mg/dL — AB (ref 8.9–10.3)
CO2: 29 mmol/L (ref 22–32)
Chloride: 100 mmol/L — ABNORMAL LOW (ref 101–111)
Creatinine, Ser: 1.09 mg/dL (ref 0.61–1.24)
GLUCOSE: 118 mg/dL — AB (ref 65–99)
POTASSIUM: 4.4 mmol/L (ref 3.5–5.1)
Sodium: 133 mmol/L — ABNORMAL LOW (ref 135–145)

## 2017-12-04 MED ORDER — ASPIRIN 325 MG PO TBEC: 325.0000 mg | DELAYED_RELEASE_TABLET | Freq: Every day | ORAL | 0 refills | Status: DC

## 2017-12-04 MED ORDER — IRON 325 (65 FE) MG PO TABS: 325.0000 mg | ORAL_TABLET | Freq: Two times a day (BID) | ORAL | 0 refills | Status: DC

## 2017-12-04 MED ORDER — OXYCODONE HCL 5 MG PO TABS: 5.0000 mg | ORAL_TABLET | ORAL | 0 refills | Status: DC | PRN

## 2017-12-04 MED ORDER — ACETAMINOPHEN 500 MG PO TABS: 1000.0000 mg | ORAL_TABLET | Freq: Four times a day (QID) | ORAL | 0 refills | Status: DC | PRN

## 2017-12-04 MED ORDER — FUROSEMIDE 40 MG PO TABS: 40.0000 mg | ORAL_TABLET | Freq: Every day | ORAL | 0 refills | Status: DC

## 2017-12-04 NOTE — Progress Notes (Signed)
Pt D/C home per MD order, dc instructions reviewed with pt and sister, all questions answered. Pt aware of follow up appt., prescriptions for oxycodone and lasix given to pt. Sutures and IV removed per MD order, pt tolerated the procedure well. Pt verbalized understanding of discharged instructions.

## 2017-12-04 NOTE — Progress Notes (Addendum)
      [address]       [place] [id number]             [phone]      5 Days Post-Op Procedure(s) (LRB): CORONARY ARTERY BYPASS GRAFTING (CABG) times three using the left saphaneous vien. harvested endoscopicly and left internal mammary artery. (N/A) TRANSESOPHAGEAL ECHOCARDIOGRAM (TEE) (N/A)   Subjective:  No new complaints.  States he is feeling better today than he did yesterday.  Wants to go home.  + BM  Objective: Vital signs in last 24 hours: Temp:  [97.8 F (36.6 C)-98.4 F (36.9 C)] 98.4 F (36.9 C) (12/23 0500) Pulse Rate:  [76-89] 89 (12/23 0500) Cardiac Rhythm: Normal sinus rhythm (12/23 0700) Resp:  [11-17] 11 (12/23 0500) BP: (83-130)/(45-75) 114/72 (12/23 0500) SpO2:  [95 %-98 %] 95 % (12/23 0500) Weight:  [227 lb 8.2 oz (103.2 kg)] 227 lb 8.2 oz (103.2 kg) (12/23 0500)  Intake/Output from previous day: 12/22 0701 - 12/23 0700 In: 480 [P.O.:480] Out: 1500 [Urine:1500]  General appearance: alert, cooperative and no distress Heart: regular rate and rhythm Lungs: clear to auscultation bilaterally Abdomen: soft, non-tender; bowel sounds normal; no masses,  no organomegaly Extremities: edema trace Wound: clean and dry  Lab Results: Recent Labs    12/02/17 0227 12/03/17 0254  WBC 12.0* 9.9  HGB 8.3* 7.7*  HCT 24.4* 22.6*  PLT 142* 158   BMET:  Recent Labs    12/03/17 0254 12/04/17 0227  NA 132* 133*  K 3.4* 4.4  CL 99* 100*  CO2 28 29  GLUCOSE 104* 118*  BUN 17 11  CREATININE 1.17 1.09  CALCIUM 7.9* 8.1*    PT/INR: No results for input(s): LABPROT, INR in the last 72 hours. ABG    Component Value Date/Time   PHART 7.322 (L) 11/30/2017 0433   HCO3 21.0 11/30/2017 0433   TCO2 23 11/30/2017 1824   ACIDBASEDEF 5.0 (H) 11/30/2017 0433   O2SAT 76.4 11/30/2017 0510   CBG (last 3)  Recent Labs    12/03/17 1624 12/03/17 2133 12/04/17 0607  GLUCAP 113* 138* 130*    Assessment/Plan: S/P Procedure(s) (LRB): CORONARY  ARTERY BYPASS GRAFTING (CABG) times three using the left saphaneous vien. harvested endoscopicly and left internal mammary artery. (N/A) TRANSESOPHAGEAL ECHOCARDIOGRAM (TEE) (N/A)  1. CV- NSR, BP soft at times- continue Lopressor 2. Pulm- no acute issues, continue IS 3. Renal- creatinine trending down, weight is almost at baseline, will continue lasix for 3 more doses 4. Expected post operative blood loss anemia- Hgb at 8, will start iron 5. DM- sugars controlled 6. Dispo- patient stable, will d/c home today   LOS: 5 days    Travis [name] 12/04/2017 Patient seen and examined, agree with above  Viviann SpareSteven C. Dorris FetchHendrickson, MD [facility] [phone]

## 2017-12-05 LAB — TYPE AND SCREEN
ABO/RH(D): A NEG
Antibody Screen: NEGATIVE
Unit division: 0
Unit division: 0
Unit division: 0
Unit division: 0

## 2017-12-05 LAB — BPAM RBC
Blood Product Expiration Date: 201901012359
Blood Product Expiration Date: 201901012359
Blood Product Expiration Date: 201901022359
Blood Product Expiration Date: 201901022359
ISSUE DATE / TIME: 201812181013
ISSUE DATE / TIME: 201812181013
Unit Type and Rh: 600
Unit Type and Rh: 600
Unit Type and Rh: 600
Unit Type and Rh: 600

## 2017-12-12 ENCOUNTER — Other Ambulatory Visit: Payer: Self-pay

## 2017-12-12 ENCOUNTER — Telehealth (HOSPITAL_COMMUNITY): Payer: Self-pay | Admitting: *Deleted

## 2017-12-12 MED ORDER — OXYCODONE HCL 5 MG PO TABS: 5.0000 mg | ORAL_TABLET | ORAL | 0 refills | Status: DC | PRN

## 2017-12-12 NOTE — Telephone Encounter (Signed)
Pt discharged prior to receiving advisement by Cardiac Rehab phase I.  Pt called and message left to contact.  Contact information provided. Alanson Alyarlette Ozan Maclay RN, BSN Cardiac and Emergency planning/management officerulmonary Rehab Nurse Navigator

## 2017-12-12 NOTE — Telephone Encounter (Signed)
Oxycodone refill okd by Dr Dorris FetchHendrickson.  Printed.

## 2017-12-16 ENCOUNTER — Ambulatory Visit (INDEPENDENT_AMBULATORY_CARE_PROVIDER_SITE_OTHER): Payer: Medicaid Other | Admitting: Cardiology

## 2017-12-16 ENCOUNTER — Encounter: Payer: Self-pay | Admitting: Cardiology

## 2017-12-16 DIAGNOSIS — Z951 Presence of aortocoronary bypass graft: Secondary | ICD-10-CM | POA: Diagnosis not present

## 2017-12-16 DIAGNOSIS — E118 Type 2 diabetes mellitus with unspecified complications: Secondary | ICD-10-CM | POA: Diagnosis not present

## 2017-12-16 DIAGNOSIS — J90 Pleural effusion, not elsewhere classified: Secondary | ICD-10-CM | POA: Insufficient documentation

## 2017-12-16 DIAGNOSIS — Z794 Long term (current) use of insulin: Secondary | ICD-10-CM | POA: Diagnosis not present

## 2017-12-16 DIAGNOSIS — I255 Ischemic cardiomyopathy: Secondary | ICD-10-CM | POA: Diagnosis not present

## 2017-12-16 DIAGNOSIS — IMO0001 Reserved for inherently not codable concepts without codable children: Secondary | ICD-10-CM

## 2017-12-16 MED ORDER — POTASSIUM CHLORIDE CRYS ER 20 MEQ PO TBCR: 20.0000 meq | EXTENDED_RELEASE_TABLET | Freq: Every day | ORAL | 0 refills | Status: DC

## 2017-12-16 MED ORDER — FUROSEMIDE 40 MG PO TABS: 40.0000 mg | ORAL_TABLET | Freq: Every day | ORAL | 0 refills | Status: DC

## 2017-12-16 NOTE — Assessment & Plan Note (Signed)
CABG x 3 11/29/17

## 2017-12-16 NOTE — Assessment & Plan Note (Signed)
EF 35-40% by echo Dec 2018

## 2017-12-16 NOTE — Assessment & Plan Note (Signed)
Pt  Has associated neuropathy

## 2017-12-16 NOTE — Patient Instructions (Signed)
Medication Instructions:  Take furosemide (Lasix) 40 mg daily x 5 days Take potassium 20 meq daily x 5 days  **Call Monday if symptoms not improving**  Follow-Up: Keep follow up with Dr. Duke Salviaandolph as scheduled 2/1  Any Other Special Instructions Will Be Listed Below (If Applicable).     If you need a refill on your cardiac medications before your next appointment, please call your pharmacy.

## 2017-12-16 NOTE — Assessment & Plan Note (Signed)
Pt has Lt pleural effusion vs LLL collapse on exam

## 2017-12-16 NOTE — Progress Notes (Signed)
12/16/2017 Travis [name]   30-Jul-1971  [id number]  Primary Physician Wendee Beavers, DO Primary Cardiologist: Dr Duke Salvia  HPI:  Pt seen today post CABG x 3 11/29/17. He was referred to [place] in Oct 2018 for pre op clearance. He has a history of IDDM. He had a Myoview  A year earlier in Aug 2017 that was abnormal with an EF of 26% and scar. He apparently followed up with his cardiologist Dr Julianne Handler at [facility] after this. Dr Duke Salvia felt he should undergo coronary angiogram and this was done Nov 2018 and revealed 3V CAD. The pt had CABG x 3 with an LIMA-LAD, SVG-PDA, and SVG-CFX on 11/29/17. He is in the office today with his sister for follow up. Since discharge he has been generally weak. Today is worse than yesterday. He denies any intermittent tachycardia. He is SOB with any exertion.    Current Outpatient Medications  Medication Sig Dispense Refill  . acetaminophen (TYLENOL) 500 MG tablet Take 2 tablets (1,000 mg total) by mouth every 6 (six) hours as needed for mild pain or fever. 30 tablet 0  . aspirin EC 325 MG EC tablet Take 1 tablet (325 mg total) by mouth daily. 30 tablet 0  . Ferrous Sulfate (IRON) 325 (65 Fe) MG TABS Take 1 tablet (325 mg total) by mouth 2 (two) times daily after a meal. 30 each 0  . gabapentin (NEURONTIN) 800 MG tablet Take 1 tablet (800 mg total) by mouth 3 (three) times daily. 90 tablet 5  . insulin detemir (LEVEMIR) 100 UNIT/ML injection Inject 0.2 mLs (20 Units total) into the skin at bedtime. 30 mL 3  . metoCLOPramide (REGLAN) 10 MG tablet Take 1 tablet (10 mg total) by mouth 4 (four) times daily -  before meals and at bedtime. 90 tablet 1  . metoprolol succinate (TOPROL XL) 25 MG 24 hr tablet Take 1 tablet (25 mg total) by mouth daily. 30 tablet 5  . oxyCODONE (OXY IR/ROXICODONE) 5 MG immediate release tablet Take 1-2 tablets (5-10 mg total) by mouth every 4 (four) hours as needed for severe pain. 30 tablet 0  . ranitidine (ZANTAC) 150 MG tablet  Take 1 tablet 2 (two) times daily by mouth.  12  . furosemide (LASIX) 40 MG tablet Take 1 tablet (40 mg total) by mouth daily. 5 tablet 0  . potassium chloride SA (K-DUR,KLOR-CON) 20 MEQ tablet Take 1 tablet (20 mEq total) by mouth daily. 5 tablet 0  . rosuvastatin (CRESTOR) 40 MG tablet Take 1 tablet (40 mg total) by mouth daily. 30 tablet 5   No current facility-administered medications for this visit.     Allergies  Allergen Reactions  . No Known Allergies     Past Medical History:  Diagnosis Date  . Acute on chronic systolic and diastolic heart failure, NYHA class 3 (HCC) 09/14/2017  . Acute osteomyelitis of metatarsal bone of left foot (HCC) 11/12/2015  . CAD in native artery 09/14/2017  . Closed fracture of right tibial plateau 11/11/2015  . Depression with anxiety   . Diabetes mellitus    INSULIN DEPENDENT Type II  . Diabetic peripheral neuropathy associated with type 2 diabetes mellitus (HCC) 08/30/2008   Qualifier: Diagnosis of  By: Daphine Deutscher FNP, Zena Amos    . Diabetic ulcer of left foot (HCC) 11/14/2015  . Gastroparesis   . GERD (gastroesophageal reflux disease)   . Hypertension   . Left ventricular aneurysm   . Osteomyelitis (HCC)   . Osteoporosis  11/12/2015  . Peripheral neuropathy   . Shortness of breath dyspnea    with exertion  . Vitamin D deficiency 11/12/2015    Social History   Socioeconomic History  . Marital status: Divorced    Spouse name: Not on file  . Number of children: 3  . Years of education: HS  . Highest education level: Not on file  Social Needs  . Financial resource strain: Not on file  . Food insecurity - worry: Not on file  . Food insecurity - inability: Not on file  . Transportation needs - medical: Not on file  . Transportation needs - non-medical: Not on file  Occupational History  . Occupation: Disabled  Tobacco Use  . Smoking status: Never Smoker  . Smokeless tobacco: Never Used  Substance and Sexual Activity  . Alcohol use: No    . Drug use: No  . Sexual activity: Yes  Other Topics Concern  . Not on file  Social History Narrative   Lives at home with his children.   Right-handed.   No caffeine use.     Family History  Problem Relation Age of Onset  . Cancer Paternal Grandmother   . COPD Mother   . Heart failure Mother   . Anxiety disorder Mother   . Depression Mother   . Fibromyalgia Mother   . Esophageal cancer Father        Smoker, still does  . Diabetes Sister   . Fibromyalgia Sister   . Diabetes Maternal Uncle   . Diabetes Maternal Grandmother   . GER disease Sister   . Liver disease Paternal Grandfather   . Hemachromatosis Paternal Grandfather   . Diabetes Maternal Grandfather      Review of Systems: General: negative for chills, fever, night sweats or weight changes.  Cardiovascular: negative for chest pain, dyspnea on exertion, edema, orthopnea, palpitations, paroxysmal nocturnal dyspnea or shortness of breath Dermatological: negative for rash Respiratory: negative for cough or wheezing Urologic: negative for hematuria Abdominal: negative for nausea, vomiting, diarrhea, bright red blood per rectum, melena, or hematemesis Neurologic: negative for visual changes, syncope, or dizziness All other systems reviewed and are otherwise negative except as noted above.    Blood pressure (!) 136/58, pulse 76, height 6\' 2"  (1.88 m), weight 237 lb (107.5 kg).  General appearance: alert, cooperative and no distress Lungs: decreased breath sounds and dullness to percusion 1/2 up on Lt, Rt is clear.  Heart: regular rate and rhythm Extremities: trace edema Skin: pale cool dry Neurologic: Grossly normal  EKG NSR TWI V4-V6  ASSESSMENT AND PLAN:   S/P CABG x 3 CABG x 3 11/29/17  Ischemic cardiomyopathy EF 35-40% by echo Dec 2018  Insulin dependent diabetes mellitus with complications (HCC) Pt  Has associated neuropathy  Pleural effusion Pt has Lt pleural effusion vs LLL collapse on  exam   PLAN  I suggested a CXR today but the pt doesn't feel up to that. Im going to put him back on Lasix 40 mg daily x 5 days. He'll call Monday if he is not improved and if not he'll need a CXR then.  Corine ShelterLuke Nury Nebergall PA-C 12/16/2017 11:35 AM

## 2017-12-17 ENCOUNTER — Inpatient Hospital Stay (HOSPITAL_COMMUNITY)
Admission: EM | Admit: 2017-12-17 | Discharge: 2017-12-19 | DRG: 074 | Disposition: A | Discharge status: Home / Self Care | Payer: Medicaid Other | Attending: Family Medicine | Admitting: Family Medicine

## 2017-12-17 ENCOUNTER — Other Ambulatory Visit: Payer: Self-pay

## 2017-12-17 DIAGNOSIS — Z951 Presence of aortocoronary bypass graft: Secondary | ICD-10-CM

## 2017-12-17 DIAGNOSIS — R05 Cough: Secondary | ICD-10-CM

## 2017-12-17 DIAGNOSIS — Z794 Long term (current) use of insulin: Secondary | ICD-10-CM

## 2017-12-17 DIAGNOSIS — Z833 Family history of diabetes mellitus: Secondary | ICD-10-CM

## 2017-12-17 DIAGNOSIS — I255 Ischemic cardiomyopathy: Secondary | ICD-10-CM | POA: Diagnosis present

## 2017-12-17 DIAGNOSIS — Z818 Family history of other mental and behavioral disorders: Secondary | ICD-10-CM

## 2017-12-17 DIAGNOSIS — Z79899 Other long term (current) drug therapy: Secondary | ICD-10-CM

## 2017-12-17 DIAGNOSIS — Z7982 Long term (current) use of aspirin: Secondary | ICD-10-CM

## 2017-12-17 DIAGNOSIS — Z8249 Family history of ischemic heart disease and other diseases of the circulatory system: Secondary | ICD-10-CM

## 2017-12-17 DIAGNOSIS — F329 Major depressive disorder, single episode, unspecified: Secondary | ICD-10-CM | POA: Diagnosis present

## 2017-12-17 DIAGNOSIS — E785 Hyperlipidemia, unspecified: Secondary | ICD-10-CM | POA: Diagnosis present

## 2017-12-17 DIAGNOSIS — Z89432 Acquired absence of left foot: Secondary | ICD-10-CM

## 2017-12-17 DIAGNOSIS — I5042 Chronic combined systolic (congestive) and diastolic (congestive) heart failure: Secondary | ICD-10-CM | POA: Diagnosis present

## 2017-12-17 DIAGNOSIS — M810 Age-related osteoporosis without current pathological fracture: Secondary | ICD-10-CM | POA: Diagnosis present

## 2017-12-17 DIAGNOSIS — R0782 Intercostal pain: Secondary | ICD-10-CM

## 2017-12-17 DIAGNOSIS — J9811 Atelectasis: Secondary | ICD-10-CM | POA: Diagnosis present

## 2017-12-17 DIAGNOSIS — I2510 Atherosclerotic heart disease of native coronary artery without angina pectoris: Secondary | ICD-10-CM | POA: Diagnosis present

## 2017-12-17 DIAGNOSIS — E1143 Type 2 diabetes mellitus with diabetic autonomic (poly)neuropathy: Principal | ICD-10-CM | POA: Diagnosis present

## 2017-12-17 DIAGNOSIS — K3184 Gastroparesis: Secondary | ICD-10-CM | POA: Diagnosis present

## 2017-12-17 DIAGNOSIS — I252 Old myocardial infarction: Secondary | ICD-10-CM

## 2017-12-17 DIAGNOSIS — R059 Cough, unspecified: Secondary | ICD-10-CM

## 2017-12-17 DIAGNOSIS — F419 Anxiety disorder, unspecified: Secondary | ICD-10-CM | POA: Diagnosis present

## 2017-12-17 DIAGNOSIS — E1142 Type 2 diabetes mellitus with diabetic polyneuropathy: Secondary | ICD-10-CM | POA: Diagnosis present

## 2017-12-17 DIAGNOSIS — R112 Nausea with vomiting, unspecified: Secondary | ICD-10-CM

## 2017-12-17 DIAGNOSIS — I110 Hypertensive heart disease with heart failure: Secondary | ICD-10-CM | POA: Diagnosis present

## 2017-12-17 DIAGNOSIS — K219 Gastro-esophageal reflux disease without esophagitis: Secondary | ICD-10-CM | POA: Diagnosis present

## 2017-12-17 LAB — URINALYSIS, ROUTINE W REFLEX MICROSCOPIC
BACTERIA UA: NONE SEEN
BILIRUBIN URINE: NEGATIVE
Glucose, UA: 50 mg/dL — AB
Hgb urine dipstick: NEGATIVE
KETONES UR: 20 mg/dL — AB
LEUKOCYTES UA: NEGATIVE
NITRITE: NEGATIVE
PROTEIN: 100 mg/dL — AB
Specific Gravity, Urine: 1.02 (ref 1.005–1.030)
pH: 9 — ABNORMAL HIGH (ref 5.0–8.0)

## 2017-12-17 LAB — CBC
HEMATOCRIT: 31.4 % — AB (ref 39.0–52.0)
Hemoglobin: 10 g/dL — ABNORMAL LOW (ref 13.0–17.0)
MCH: 28.2 pg (ref 26.0–34.0)
MCHC: 31.8 g/dL (ref 30.0–36.0)
MCV: 88.5 fL (ref 78.0–100.0)
Platelets: 501 10*3/uL — ABNORMAL HIGH (ref 150–400)
RBC: 3.55 MIL/uL — AB (ref 4.22–5.81)
RDW: 14.4 % (ref 11.5–15.5)
WBC: 9.5 10*3/uL (ref 4.0–10.5)

## 2017-12-17 LAB — CBG MONITORING, ED: GLUCOSE-CAPILLARY: 145 mg/dL — AB (ref 65–99)

## 2017-12-17 LAB — COMPREHENSIVE METABOLIC PANEL
ALT: 19 U/L (ref 17–63)
AST: 25 U/L (ref 15–41)
Albumin: 3.4 g/dL — ABNORMAL LOW (ref 3.5–5.0)
Alkaline Phosphatase: 126 U/L (ref 38–126)
Anion gap: 11 (ref 5–15)
BUN: 8 mg/dL (ref 6–20)
CHLORIDE: 99 mmol/L — AB (ref 101–111)
CO2: 23 mmol/L (ref 22–32)
Calcium: 9.1 mg/dL (ref 8.9–10.3)
Creatinine, Ser: 0.88 mg/dL (ref 0.61–1.24)
Glucose, Bld: 153 mg/dL — ABNORMAL HIGH (ref 65–99)
POTASSIUM: 4.3 mmol/L (ref 3.5–5.1)
Sodium: 133 mmol/L — ABNORMAL LOW (ref 135–145)
Total Bilirubin: 1.5 mg/dL — ABNORMAL HIGH (ref 0.3–1.2)
Total Protein: 6.8 g/dL (ref 6.5–8.1)

## 2017-12-17 LAB — LIPASE, BLOOD: Lipase: 21 U/L (ref 11–51)

## 2017-12-17 MED ORDER — LORAZEPAM 2 MG/ML IJ SOLN
1.0000 mg | Freq: Once | INTRAMUSCULAR | Status: AC
  Administered 2017-12-17: 1 mg via INTRAVENOUS
  Filled 2017-12-17: qty 1

## 2017-12-17 MED ORDER — SODIUM CHLORIDE 0.9 % IV BOLUS (SEPSIS)
500.0000 mL | Freq: Once | INTRAVENOUS | Status: AC
  Administered 2017-12-17: 500 mL via INTRAVENOUS

## 2017-12-17 MED ORDER — METOCLOPRAMIDE HCL 5 MG/ML IJ SOLN
10.0000 mg | Freq: Once | INTRAMUSCULAR | Status: AC
  Administered 2017-12-17: 10 mg via INTRAVENOUS
  Filled 2017-12-17: qty 2

## 2017-12-17 MED ORDER — PROMETHAZINE HCL 25 MG/ML IJ SOLN
25.0000 mg | Freq: Once | INTRAMUSCULAR | Status: AC
  Administered 2017-12-17: 25 mg via INTRAVENOUS
  Filled 2017-12-17: qty 1

## 2017-12-17 NOTE — ED Triage Notes (Signed)
The patient had a CABG on the 18th and has not had any complications.  He started with nausea, vomiting and chills but has not taken anything at home.  EMS was called they put a 20g IV int he right hand and gave 4mg  of Zofran IV.  The patient does have pain to the left shoulder and he rates it 2/10.  He is alert and oriented X4.

## 2017-12-17 NOTE — ED Notes (Signed)
Family at bedside. 

## 2017-12-17 NOTE — ED Provider Notes (Signed)
[facility] EMERGENCY DEPARTMENT Provider Note   CSN: [id number] Arrival date & time: 12/17/17  1758     History   Chief Complaint Chief Complaint  Patient presents with  . Nausea  . Emesis  . Chills    HPI Travis [name] is a 47 y.o. male with past medical history of insulin-dependent type 2 diabetes, CAD, CHF, gastroparesis, GERD, presenting to the ED with 1 day of persistent nausea and vomiting.  Patient states he began feeling nauseous yesterday morning, then began having vomiting since yesterday afternoon through today.  He states this is very similar to his gastroparesis, which normally is improved with Reglan.  Patient states he was unable to keep the Reglan down and therefore unable to manage his N/V at home.  Denies fever, abdominal pain, urinary symptoms, chest pain, shortness of breath. States it is normal for him to develop diarrhea with a gastroparesis "flare." Of note, patient is status post CABG on 11/29/2017.  He was initially getting preop workup for cholecystectomy, however was found to have coronary artery disease requiring intervention prior to gallbladder removal.  He states he still currently has his gallbladder.  The history is provided by the patient and a relative.    Past Medical History:  Diagnosis Date  . Acute on chronic systolic and diastolic heart failure, NYHA class 3 (HCC) 09/14/2017  . Acute osteomyelitis of metatarsal bone of left foot (HCC) 11/12/2015  . CAD in native artery 09/14/2017  . Closed fracture of right tibial plateau 11/11/2015  . Depression with anxiety   . Diabetes mellitus    INSULIN DEPENDENT Type II  . Diabetic peripheral neuropathy associated with type 2 diabetes mellitus (HCC) 08/30/2008   Qualifier: Diagnosis of  By: Daphine Deutscher FNP, Zena Amos    . Diabetic ulcer of left foot (HCC) 11/14/2015  . Gastroparesis   . GERD (gastroesophageal reflux disease)   . Hypertension   . Left ventricular aneurysm   .  Osteomyelitis (HCC)   . Osteoporosis 11/12/2015  . Peripheral neuropathy   . Shortness of breath dyspnea    with exertion  . Vitamin D deficiency 11/12/2015    Patient Active Problem List   Diagnosis Date Noted  . Ischemic cardiomyopathy 12/16/2017  . Pleural effusion 12/16/2017  . S/P CABG x 3 11/29/2017  . Anxiety with depression 09/16/2017  . Acute on chronic systolic and diastolic heart failure, NYHA class 3 (HCC) 09/14/2017  . CAD in native artery 09/14/2017  . 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 Short Achilles tendon (acquired), left ankle 11/19/2016  . Gastroparesis due to DM (HCC)   . History of diabetic ulcer of foot 11/14/2015  . Vitamin D deficiency 11/12/2015  . Osteoporosis 11/12/2015  . GERD (gastroesophageal reflux disease) 11/03/2015  . Erectile dysfunction 07/11/2009  . Allergic rhinitis 12/11/2008  . 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 Insulin dependent diabetes mellitus with complications (HCC) 08/09/2007    Past Surgical History:  Procedure Laterality Date  . AMPUTATION Left 11/28/2015   Procedure: Left 4th Ray Amputation;  Surgeon: Nadara Mustard, MD;  Location: [facility] OR;  Service: Orthopedics;  Laterality: Left;  . AMPUTATION Left 10/29/2016   Procedure: LEFT 5TH RAY AMPUTATION;  Surgeon: Nadara Mustard, MD;  Location: [facility] OR;  Service: Orthopedics;  Laterality: Left;  . ANTERIOR VITRECTOMY Left 04/12/2016   Procedure: ANTERIOR CHAMBER WASH OUT WITH GAS FLUID EXCHANGE;  Surgeon: Carmela Rima, MD;  Location: [facility] OR;  Service: Ophthalmology;  Laterality: Left;  . CARDIAC CATHETERIZATION    . CATARACT EXTRACTION Left   . CORONARY ARTERY BYPASS GRAFT N/A 11/29/2017   Procedure: CORONARY ARTERY BYPASS GRAFTING (CABG) times three using the left saphaneous vien. harvested endoscopicly and left internal mammary artery.;  Surgeon: Kerin Perna, MD;  Location: [facility] OR;  Service: Open Heart Surgery;   Laterality: N/A;  . EYE SURGERY     left eye surgery 04/2016  . KNEE SURGERY Left   . RIGHT/LEFT HEART CATH AND CORONARY ANGIOGRAPHY N/A 10/19/2017   Procedure: RIGHT/LEFT HEART CATH AND CORONARY ANGIOGRAPHY;  Surgeon: Swazil[place], [name], MD;  Location: [facility] INVASIVE CV LAB;  Service: Cardiovascular;  Laterality: N/A;  . SHOULDER SURGERY Right   . TEE WITHOUT CARDIOVERSION N/A 11/29/2017   Procedure: TRANSESOPHAGEAL ECHOCARDIOGRAM (TEE);  Surgeon: Donata Clay, Theron Arista, MD;  Location: [facility] OR;  Service: Open Heart Surgery;  Laterality: N/A;  . TOE AMPUTATION Left 11/28/15   4th toe       Home Medications    Prior to Admission medications   Medication Sig Start Date End Date Taking? Authorizing Provider  acetaminophen (TYLENOL) 500 MG tablet Take 2 tablets (1,000 mg total) by mouth every 6 (six) hours as needed for mild pain or fever. 12/04/17  Yes Barrett, Rae Roam, PA-C  aspirin EC 325 MG EC tablet Take 1 tablet (325 mg total) by mouth daily. 12/04/17  Yes Barrett, Rae Roam, PA-C  Ferrous Sulfate (IRON) 325 (65 Fe) MG TABS Take 1 tablet (325 mg total) by mouth 2 (two) times daily after a meal. 12/04/17  Yes Barrett, Erin R, PA-C  furosemide (LASIX) 40 MG tablet Take 1 tablet (40 mg total) by mouth daily. 12/16/17 03/16/18 Yes Kilroy, Luke K, PA-C  gabapentin (NEURONTIN) 800 MG tablet Take 1 tablet (800 mg total) by mouth 3 (three) times daily. 11/08/17  Yes Wendee Beavers, DO  insulin detemir (LEVEMIR) 100 UNIT/ML injection Inject 0.2 mLs (20 Units total) into the skin at bedtime. 06/04/16  Yes [name], FNP  metoCLOPramide (REGLAN) 10 MG tablet Take 1 tablet (10 mg total) by mouth 4 (four) times daily -  before meals and at bedtime. 04/12/17  Yes Noralee Stain, DO  metoprolol succinate (TOPROL XL) 25 MG 24 hr tablet Take 1 tablet (25 mg total) by mouth daily. 09/14/17  Yes Chilton Si, MD  oxyCODONE (OXY IR/ROXICODONE) 5 MG immediate release tablet Take 1-2 tablets (5-10 mg total) by mouth  every 4 (four) hours as needed for severe pain. 12/12/17  Yes Loreli Slot, MD  potassium chloride SA (K-DUR,KLOR-CON) 20 MEQ tablet Take 1 tablet (20 mEq total) by mouth daily. 12/16/17  Yes Kilroy, Luke K, PA-C  ranitidine (ZANTAC) 150 MG tablet Take 1 tablet 2 (two) times daily by mouth. 10/01/17  Yes [provider]  rosuvastatin (CRESTOR) 40 MG tablet Take 1 tablet (40 mg total) by mouth daily. 09/14/17 12/17/17 Yes Chilton Si, MD    Family History Family History  Problem Relation Age of Onset  . Cancer Paternal Grandmother   . COPD Mother   . Heart failure Mother   . Anxiety disorder Mother   . Depression Mother   . Fibromyalgia Mother   . Esophageal cancer Father        Smoker, still does  . Diabetes Sister   . Fibromyalgia Sister   . Diabetes Maternal Uncle   . Diabetes Maternal Grandmother   . GER disease Sister   . Liver  disease Paternal Grandfather   . Hemachromatosis Paternal Grandfather   . Diabetes Maternal Grandfather     Social History Social History   Tobacco Use  . Smoking status: Never Smoker  . Smokeless tobacco: Never Used  Substance Use Topics  . Alcohol use: No  . Drug use: No     Allergies   Patient has no known allergies.   Review of Systems Review of Systems  Constitutional: Negative for fever.  Respiratory: Negative for shortness of breath.   Cardiovascular: Negative for chest pain and leg swelling.  Gastrointestinal: Positive for nausea and vomiting. Negative for abdominal pain, constipation and diarrhea.  Genitourinary: Negative for dysuria and frequency.  All other systems reviewed and are negative.    Physical Exam Updated Vital Signs BP (!) 145/72 (BP Location: Right Arm)   Pulse 92   Temp 98.7 F (37.1 C) (Oral)   Resp (!) 21   Ht 6\' 2"  (1.88 m)   Wt 107.5 kg (237 lb)   SpO2 95%   BMI 30.43 kg/m   Physical Exam  Constitutional: He appears well-developed and well-nourished.  HENT:  Head:  Normocephalic and atraumatic.  Mouth/Throat: Oropharynx is clear and moist.  Eyes: Conjunctivae are normal.  Cardiovascular: Normal rate, regular rhythm and intact distal pulses.  Pulmonary/Chest: Effort normal and breath sounds normal. No respiratory distress.  Abdominal: Soft. Bowel sounds are normal. He exhibits no distension and no mass. There is no tenderness. There is no rebound and no guarding.  Neurological: He is alert.  Skin: Skin is warm.  Psychiatric: He has a normal mood and affect. His behavior is normal.  Nursing note and vitals reviewed.    ED Treatments / Results  Labs (all labs ordered are listed, but only abnormal results are displayed) Labs Reviewed  COMPREHENSIVE METABOLIC PANEL - Abnormal; Notable for the following components:      Result Value   Sodium 133 (*)    Chloride 99 (*)    Glucose, Bld 153 (*)    Albumin 3.4 (*)    Total Bilirubin 1.5 (*)    All other components within normal limits  CBC - Abnormal; Notable for the following components:   RBC 3.55 (*)    Hemoglobin 10.0 (*)    HCT 31.4 (*)    Platelets 501 (*)    All other components within normal limits  URINALYSIS, ROUTINE W REFLEX MICROSCOPIC - Abnormal; Notable for the following components:   APPearance HAZY (*)    pH 9.0 (*)    Glucose, UA 50 (*)    Ketones, ur 20 (*)    Protein, ur 100 (*)    Squamous Epithelial / LPF 0-5 (*)    All other components within normal limits  CBG MONITORING, ED - Abnormal; Notable for the following components:   Glucose-Capillary 145 (*)    All other components within normal limits  LIPASE, BLOOD    EKG  EKG Interpretation  Date/Time:  Saturday December 17 2017 18:20:16 EST Ventricular Rate:  86 PR Interval:    QRS Duration: 106 QT Interval:  403 QTC Calculation: 482 R Axis:   97 Text Interpretation:  Sinus rhythm Abnormal inferior Q waves Probable anterolateral infarct, age indeterm Abnormal T, consider ischemia, lateral leads Baseline wander in  lead(s) II III aVF V5 V6 since previous tracing, inferior T wave changes have improved Confirmed by Frederick Peers [id number]) on 12/17/2017 7:43:32 PM       Radiology No results found.  Procedures Procedures (including critical  care time)  Medications Ordered in ED Medications  metoCLOPramide (REGLAN) injection 10 mg (10 mg Intravenous Given 12/17/17 1925)  promethazine (PHENERGAN) injection 25 mg (25 mg Intravenous Given 12/17/17 2133)  sodium chloride 0.9 % bolus 500 mL (0 mLs Intravenous Stopped 12/17/17 2348)  LORazepam (ATIVAN) injection 1 mg (1 mg Intravenous Given 12/17/17 2244)  LORazepam (ATIVAN) injection 1 mg (1 mg Intravenous Given 12/17/17 2348)     Initial Impression / Assessment and Plan / ED Course  I have reviewed the triage vital signs and the nursing notes.  Pertinent labs & imaging results that were available during my care of the patient were reviewed by me and considered in my medical decision making (see chart for details).  Clinical Course as of Dec 19 115  Wynelle LinkSun Dec 18, 2017  0057 Dr. Frances FurbishWinfrey with Family Medicine accepting admission to intractable vomiting.  [JR]    Clinical Course User Index [JR] Lalania Haseman, [place] N, PA-C    Pt presenting with N/V since yesterday. Hx of insulin-dependent T2DM and gastroparesis, reporting symptoms feeling like gastroparesis "flare." Pt without abdominal pain, F, urinary sx, fever. Abd exam without tenderness or peritoneal signs. WBC wnl, CBC at baseline. CMP unremarkable. CBG 145. Pt did develop diarrhea in ED, however reports this is normal for him when he has an acute flare of gastroparesis. Symptoms treated with IVF and reglan, however without relief in vomiting. Pt treated with multiple medications, including phenergan and 2 doses of ativan with persistent vomiting. Cannot rule out viral gastroenteritis. Family medicine service consulted for admission; spoke with Dr. Frances FurbishWinfrey who is accepting admission for intractable vomiting.  Patient  discussed with Dr. Clarene DukeLittle.  The patient appears reasonably stabilized for admission considering the current resources, flow, and capabilities available in the ED at this time, and I doubt any other [facility] requiring further screening and/or treatment in the ED prior to admission.  Final Clinical Impressions(s) / ED Diagnoses   Final diagnoses:  Intractable vomiting with nausea, unspecified vomiting type    ED Discharge Orders    None       Mann Skaggs, [place] N, PA-C 12/18/17 0119    Little, Ambrose Finlandachel [name], MD 12/19/17 2042

## 2017-12-18 ENCOUNTER — Other Ambulatory Visit: Payer: Self-pay

## 2017-12-18 ENCOUNTER — Encounter (HOSPITAL_COMMUNITY): Payer: Self-pay

## 2017-12-18 ENCOUNTER — Observation Stay (HOSPITAL_COMMUNITY): Payer: Medicaid Other

## 2017-12-18 DIAGNOSIS — F329 Major depressive disorder, single episode, unspecified: Secondary | ICD-10-CM | POA: Diagnosis present

## 2017-12-18 DIAGNOSIS — J9811 Atelectasis: Secondary | ICD-10-CM | POA: Diagnosis present

## 2017-12-18 DIAGNOSIS — R059 Cough, unspecified: Secondary | ICD-10-CM

## 2017-12-18 DIAGNOSIS — Z794 Long term (current) use of insulin: Secondary | ICD-10-CM | POA: Diagnosis not present

## 2017-12-18 DIAGNOSIS — R05 Cough: Secondary | ICD-10-CM | POA: Diagnosis not present

## 2017-12-18 DIAGNOSIS — K3184 Gastroparesis: Secondary | ICD-10-CM

## 2017-12-18 DIAGNOSIS — R0782 Intercostal pain: Secondary | ICD-10-CM

## 2017-12-18 DIAGNOSIS — E785 Hyperlipidemia, unspecified: Secondary | ICD-10-CM | POA: Diagnosis present

## 2017-12-18 DIAGNOSIS — Z7982 Long term (current) use of aspirin: Secondary | ICD-10-CM | POA: Diagnosis not present

## 2017-12-18 DIAGNOSIS — E1143 Type 2 diabetes mellitus with diabetic autonomic (poly)neuropathy: Secondary | ICD-10-CM | POA: Diagnosis present

## 2017-12-18 DIAGNOSIS — Z89432 Acquired absence of left foot: Secondary | ICD-10-CM | POA: Diagnosis not present

## 2017-12-18 DIAGNOSIS — I255 Ischemic cardiomyopathy: Secondary | ICD-10-CM | POA: Diagnosis present

## 2017-12-18 DIAGNOSIS — Z818 Family history of other mental and behavioral disorders: Secondary | ICD-10-CM | POA: Diagnosis not present

## 2017-12-18 DIAGNOSIS — Z8249 Family history of ischemic heart disease and other diseases of the circulatory system: Secondary | ICD-10-CM | POA: Diagnosis not present

## 2017-12-18 DIAGNOSIS — Z951 Presence of aortocoronary bypass graft: Secondary | ICD-10-CM | POA: Diagnosis not present

## 2017-12-18 DIAGNOSIS — I2510 Atherosclerotic heart disease of native coronary artery without angina pectoris: Secondary | ICD-10-CM | POA: Diagnosis present

## 2017-12-18 DIAGNOSIS — E1142 Type 2 diabetes mellitus with diabetic polyneuropathy: Secondary | ICD-10-CM | POA: Diagnosis present

## 2017-12-18 DIAGNOSIS — R112 Nausea with vomiting, unspecified: Secondary | ICD-10-CM

## 2017-12-18 DIAGNOSIS — K219 Gastro-esophageal reflux disease without esophagitis: Secondary | ICD-10-CM | POA: Diagnosis present

## 2017-12-18 DIAGNOSIS — M810 Age-related osteoporosis without current pathological fracture: Secondary | ICD-10-CM | POA: Diagnosis present

## 2017-12-18 DIAGNOSIS — F419 Anxiety disorder, unspecified: Secondary | ICD-10-CM | POA: Diagnosis present

## 2017-12-18 DIAGNOSIS — Z833 Family history of diabetes mellitus: Secondary | ICD-10-CM | POA: Diagnosis not present

## 2017-12-18 DIAGNOSIS — I5042 Chronic combined systolic (congestive) and diastolic (congestive) heart failure: Secondary | ICD-10-CM | POA: Diagnosis present

## 2017-12-18 DIAGNOSIS — Z79899 Other long term (current) drug therapy: Secondary | ICD-10-CM | POA: Diagnosis not present

## 2017-12-18 DIAGNOSIS — I110 Hypertensive heart disease with heart failure: Secondary | ICD-10-CM | POA: Diagnosis present

## 2017-12-18 DIAGNOSIS — I252 Old myocardial infarction: Secondary | ICD-10-CM | POA: Diagnosis not present

## 2017-12-18 LAB — TROPONIN I
TROPONIN I: 0.06 ng/mL — AB (ref <0.03)
TROPONIN I: 0.06 ng/mL — AB (ref <0.03)

## 2017-12-18 LAB — CBG MONITORING, ED
Glucose-Capillary: 139 mg/dL — ABNORMAL HIGH (ref 65–99)
Glucose-Capillary: 126 mg/dL — ABNORMAL HIGH (ref 65–99)
Glucose-Capillary: 142 mg/dL — ABNORMAL HIGH (ref 65–99)

## 2017-12-18 LAB — CBC
HCT: 29.5 % — ABNORMAL LOW (ref 39.0–52.0)
HEMOGLOBIN: 9.3 g/dL — AB (ref 13.0–17.0)
MCH: 28.1 pg (ref 26.0–34.0)
MCHC: 31.5 g/dL (ref 30.0–36.0)
MCV: 89.1 fL (ref 78.0–100.0)
Platelets: 441 10*3/uL — ABNORMAL HIGH (ref 150–400)
RBC: 3.31 MIL/uL — ABNORMAL LOW (ref 4.22–5.81)
RDW: 14.7 % (ref 11.5–15.5)
WBC: 11.7 10*3/uL — ABNORMAL HIGH (ref 4.0–10.5)

## 2017-12-18 LAB — GASTROINTESTINAL PANEL BY PCR, STOOL (REPLACES STOOL CULTURE)

## 2017-12-18 LAB — C DIFFICILE QUICK SCREEN W PCR REFLEX
C Diff antigen: POSITIVE — AB
C Diff toxin: NEGATIVE

## 2017-12-18 LAB — BASIC METABOLIC PANEL
Anion gap: 10 (ref 5–15)
BUN: 12 mg/dL (ref 6–20)
CALCIUM: 8.8 mg/dL — AB (ref 8.9–10.3)
CO2: 22 mmol/L (ref 22–32)
Chloride: 102 mmol/L (ref 101–111)
Creatinine, Ser: 0.97 mg/dL (ref 0.61–1.24)
GFR calc Af Amer: >60 mL/min (ref >60)
GLUCOSE: 162 mg/dL — AB (ref 65–99)
Potassium: 3.8 mmol/L (ref 3.5–5.1)
Sodium: 134 mmol/L — ABNORMAL LOW (ref 135–145)

## 2017-12-18 LAB — GLUCOSE, CAPILLARY
GLUCOSE-CAPILLARY: 113 mg/dL — AB (ref 65–99)
Glucose-Capillary: 121 mg/dL — ABNORMAL HIGH (ref 65–99)
Glucose-Capillary: 128 mg/dL — ABNORMAL HIGH (ref 65–99)

## 2017-12-18 LAB — CLOSTRIDIUM DIFFICILE BY PCR, REFLEXED: Toxigenic C. Difficile by PCR: NEGATIVE

## 2017-12-18 LAB — HIV ANTIBODY (ROUTINE TESTING W REFLEX): HIV SCREEN 4TH GENERATION: NONREACTIVE

## 2017-12-18 MED ORDER — OXYCODONE HCL 5 MG PO TABS
5.0000 mg | ORAL_TABLET | ORAL | Status: DC | PRN
Start: 2017-12-18 — End: 2017-12-19
  Administered 2017-12-18 – 2017-12-19 (×2): 5 mg via ORAL
  Administered 2017-12-19: 10 mg via ORAL
  Administered 2017-12-19: 5 mg via ORAL
  Filled 2017-12-18: qty 1
  Filled 2017-12-18: qty 2
  Filled 2017-12-18 (×2): qty 1

## 2017-12-18 MED ORDER — INSULIN ASPART 100 UNIT/ML ~~LOC~~ SOLN
0.0000 [IU] | SUBCUTANEOUS | Status: DC
  Administered 2017-12-18 – 2017-12-19 (×5): 2 [IU] via SUBCUTANEOUS
  Filled 2017-12-18 (×2): qty 1

## 2017-12-18 MED ORDER — GI COCKTAIL ~~LOC~~
30.0000 mL | Freq: Three times a day (TID) | ORAL | Status: DC | PRN
  Administered 2017-12-18: 30 mL via ORAL
  Filled 2017-12-18: qty 30

## 2017-12-18 MED ORDER — METOCLOPRAMIDE HCL 5 MG/ML IJ SOLN
10.0000 mg | Freq: Four times a day (QID) | INTRAMUSCULAR | Status: DC
  Administered 2017-12-18 – 2017-12-19 (×7): 10 mg via INTRAVENOUS
  Filled 2017-12-18 (×7): qty 2

## 2017-12-18 MED ORDER — SODIUM CHLORIDE 0.9 % IV SOLN
INTRAVENOUS | Status: DC
  Administered 2017-12-18 (×2): via INTRAVENOUS

## 2017-12-18 MED ORDER — ENOXAPARIN SODIUM 40 MG/0.4ML ~~LOC~~ SOLN
40.0000 mg | Freq: Every day | SUBCUTANEOUS | Status: DC
  Administered 2017-12-18 – 2017-12-19 (×2): 40 mg via SUBCUTANEOUS
  Filled 2017-12-18 (×3): qty 0.4

## 2017-12-18 MED ORDER — PANTOPRAZOLE SODIUM 40 MG IV SOLR
40.0000 mg | Freq: Once | INTRAVENOUS | Status: AC
  Administered 2017-12-18: 40 mg via INTRAVENOUS

## 2017-12-18 MED ORDER — PANTOPRAZOLE SODIUM 40 MG IV SOLR
40.0000 mg | Freq: Every day | INTRAVENOUS | Status: DC
  Administered 2017-12-18 – 2017-12-19 (×2): 40 mg via INTRAVENOUS
  Filled 2017-12-18 (×3): qty 40

## 2017-12-18 NOTE — ED Notes (Signed)
Lab contacted regarding Troponin result, Lab advised they have had some issues with their instruments but have lab in process and should have result soon.

## 2017-12-18 NOTE — Progress Notes (Signed)
FPTS Interim Progress Note  S: Patient says he has been dry heaving throughout the night and has had profuse watery diarrhea requiring a rectal tube.  He has not had chills or felt like he's had a fever overnight.  He says that he has a chronic cough, but it has been better than normal over the last few days.  O: BP (!) 122/56   Pulse 88   Temp (!) 100.6 F (38.1 C) (Rectal)   Resp 16   Ht 6\' 2"  (1.88 m)   Wt 237 lb (107.5 kg)   SpO2 96%   BMI 30.43 kg/m     A/P: Nausea and vomiting, likely due to gastroparesis - reglan IV 10 mg QID, protonix, GI cocktail - clear liquid diet  Diarrhea: could also be due to gastroparesis, however, fever and profuse nature of diarrhea is suggestive of C. Difficile or other infectious source - C. Diff testing - GI pathogen panel  CHF - daily weights - troponin slightly elevated, likely due to recent CABG, will trend these  Atelectasis vs Pneumonia - CXR shows area consistent with atelectasis or pneumonia in left base - patient with low-grade fever but no other concerning features of pneumonia - incentive spirometry   Lennox SoldersWinfrey, Amanda C, MD 12/18/2017, 9:02 AM PGY-1, [facility] pager [phone]

## 2017-12-18 NOTE — ED Notes (Signed)
Pt reeports he has had hiccups since yesterday and is now having chest pain 3/10 to surgical site. MD has been paged.

## 2017-12-18 NOTE — ED Notes (Signed)
Family at bedside. 

## 2017-12-18 NOTE — ED Notes (Addendum)
HW labels sent to main lab for trop, bmp,cbc, and hiv blood test.

## 2017-12-18 NOTE — Progress Notes (Signed)
Katha CabalMichael T Fazzino is a 47 y.o. male patient admitted from ED awake, alert - oriented  X 4 - no acute distress noted.  VSS - Blood pressure 134/67, pulse 80, temperature 98.1 F (36.7 C), temperature source Oral, resp. rate 17, height 6\' 2"  (1.88 m), weight 107.5 kg (237 lb), SpO2 98 %.    IV in place, occlusive dsg intact without redness.  Orientation to room, and floor completed with information packet given to patient/family.  Patient declined safety video at this time.  Admission INP armband ID verified with patient/family, and in place.   SR up x 2, fall assessment complete, with patient and family able to verbalize understanding of risk associated with falls, and verbalized understanding to call nsg before up out of bed.  Call light within reach, patient able to voice, and demonstrate understanding. Skin assessment completed as documented.     Will cont to eval and treat per MD orders.  Caren HazyKamila A Quetzally Callas, RN 12/18/2017 6:43 PM

## 2017-12-18 NOTE — ED Notes (Signed)
Dr. Leveda AnnaHensel paged to Tobi BastosAnna, [place]

## 2017-12-18 NOTE — ED Notes (Signed)
Dr. Frances FurbishWinfrey contacted this RN regarding page about troponin and advised we would continue to trend troponins on patient and she would consult with care team regarding further treatment.

## 2017-12-18 NOTE — ED Notes (Signed)
Attending physician paged regarding patient's ongoing diarrhea.

## 2017-12-18 NOTE — H&P (Signed)
Family Medicine Teaching [facility] Admission History and Physical Service Pager: [phone]  Patient name: Travis [name] Medical record number: [id number] Date of birth: 1971-06-10 Age: 47 y.o. Gender: male  Primary Care Provider: Wendee Beavers, DO Consultants: none Code Status: partial  Chief Complaint: Intractable nausea, vomiting, and diarrhea  Assessment and Plan: KIEFFER BLATZ is a 47 y.o. male presenting with intractable nausea and vomiting as well as diarrhea in the setting of chronic gastroparesis. PMH is significant for T2DM, esophageal reflux, HLD, CAD s/p CABG in November 30, 2017, CHF, and diabetic neuropathy.  Nausea and vomiting: Patient has a long-standing history of gastroparesis which is managed with reglan at home, but his nausea and vomiting have intensified over the past few days, and he was unable to keep down his reglan.  He has also had three loose stools since arriving to the ED.  His home regimen is reglan 10 mg QID.  BMP wnl.  This is likely an exacerbation of his gastroparesis, but other possibilities include viral gastroenteritis, SBO, cyclical vomiting syndrome.  Abdominal exam is benign, therefore less concerning for SBO. Nausea has been the primary symptom, making gastroparesis most likely especially given his history. - admit to telemetry, attending Dr. Leveda Anna - am BMP - am CBC - NS @ 100 ml/hr - clear liquid diet - reglan IV 10 mg QID - GI cocktail - IV protonix - C. Diff and GI panel   T2DM, well controlled: Last A1c on 11/28/17 is 7.0.  Takes Levemir 20 units once daily. - hold home Levemir, restart at reduced dose as needed  - SSI moderate Q4H - CBG Q4H  Esophageal reflux: Patient takes zantac at home - IV protonix - Provided a GI cocktail   HLD: Last lipid panel on record is from 2017.  Takes crestor 20 mg at home. - hold home crestor due to patient's nausea and vomiting  CAD s/p CABG: Patient with a history of MI 2-3 years ago.   3 vessel CABG performed in December 2018.  Takes metoprolol 25 mg daily. EKG initial with artifact, repeat EKG without any significant changes similar to previous  - hold home metoprolol due to patient's nausea and vomiting - am EKG - trend troponins x 2 - consider letting CVTS know about patient presenting given CABG two weeks ago  CHF: Echo in December showed and EF of 45-55%. Just restarted on lasix 40 mg daily at home this Friday, as Cardiology with concern about crackles in lung fields, euvolemic on exam, dry weight of 220 lbs. - Obtain chest xray  - daily weights  - hold home Lasix  FEN/GI: clear liquid diet, protonix Prophylaxis: lovenox  Disposition: admit to telemetry  History of Present Illness:  Travis [name] is a 47 y.o. male presenting with vomiting and diarrhea. States he has vomited many times over the past 48 hours. Patient indicates 3 loose stool bowel movements since arrival to the ED. Patient has been taking PO Reglan, has not been able to take this medication by mouth as is unable to keep anything down. Per family and patient this is similar to previous episodes of gastroparesis. Per sister patient seemed to get very diaphoretic with vomiting, but patient has been afebrile.  Saw Cardiology yesterday and was started on Lasix but could not keep this down either.  They report that the cardiologist said he had some fluid in his left lung yesterday.  He has had shortness of breath when lying down since his CABG  in mid-December, but otherwise he has recovered well from that operation.  Review Of Systems: Per HPI with the following additions:   Review of Systems  Constitutional: Positive for diaphoresis. Negative for chills, fever and weight loss.  HENT: Negative for congestion and sore throat.   Eyes: Negative for pain and discharge.  Respiratory: Positive for shortness of breath. Negative for cough.   Cardiovascular: Positive for palpitations. Negative for chest pain.   Gastrointestinal: Positive for diarrhea, heartburn, nausea and vomiting. Negative for abdominal pain, blood in stool and melena.  Genitourinary: Negative for dysuria.  Neurological: Negative for dizziness and headaches.  Endo/Heme/Allergies: Does not bruise/bleed easily.    Patient Active Problem List   Diagnosis Date Noted  . Ischemic cardiomyopathy 12/16/2017  . Pleural effusion 12/16/2017  . S/P CABG x 3 11/29/2017  . Anxiety with depression 09/16/2017  . Acute on chronic systolic and diastolic heart failure, NYHA class 3 (HCC) 09/14/2017  . CAD in native artery 09/14/2017  . 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 Short Achilles tendon (acquired), left ankle 11/19/2016  . Gastroparesis due to DM (HCC)   . History of diabetic ulcer of foot 11/14/2015  . Vitamin D deficiency 11/12/2015  . Osteoporosis 11/12/2015  . GERD (gastroesophageal reflux disease) 11/03/2015  . Erectile dysfunction 07/11/2009  . Allergic rhinitis 12/11/2008  . 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 Insulin dependent diabetes mellitus with complications (HCC) 08/09/2007    Past Medical History: Past Medical History:  Diagnosis Date  . Acute on chronic systolic and diastolic heart failure, NYHA class 3 (HCC) 09/14/2017  . Acute osteomyelitis of metatarsal bone of left foot (HCC) 11/12/2015  . CAD in native artery 09/14/2017  . Closed fracture of right tibial plateau 11/11/2015  . Depression with anxiety   . Diabetes mellitus    INSULIN DEPENDENT Type II  . Diabetic peripheral neuropathy associated with type 2 diabetes mellitus (HCC) 08/30/2008   Qualifier: Diagnosis of  By: Daphine DeutscherMartin FNP, Zena AmosNykedtra    . Diabetic ulcer of left foot (HCC) 11/14/2015  . Gastroparesis   . GERD (gastroesophageal reflux disease)   . Hypertension   . Left ventricular aneurysm   . Osteomyelitis (HCC)   . Osteoporosis 11/12/2015  . Peripheral neuropathy   . Shortness  of breath dyspnea    with exertion  . Vitamin D deficiency 11/12/2015    Past Surgical History: Past Surgical History:  Procedure Laterality Date  . AMPUTATION Left 11/28/2015   Procedure: Left 4th Ray Amputation;  Surgeon: Nadara MustardMarcus Duda V, MD;  [facility];  Service: Orthopedics;  Laterality: Left;  . AMPUTATION Left 10/29/2016   Procedure: LEFT 5TH RAY AMPUTATION;  Surgeon: Nadara MustardMarcus V Duda, MD;  Location: [facility] OR;  Service: Orthopedics;  Laterality: Left;  . ANTERIOR VITRECTOMY Left 04/12/2016   Procedure: ANTERIOR CHAMBER WASH OUT WITH GAS FLUID EXCHANGE;  Surgeon: Carmela RimaNarendra Patel, MD;  [facility];  Service: Ophthalmology;  Laterality: Left;  . CARDIAC CATHETERIZATION    . CATARACT EXTRACTION Left   . CORONARY ARTERY BYPASS GRAFT N/A 11/29/2017   Procedure: CORONARY ARTERY BYPASS GRAFTING (CABG) times three using the left saphaneous vien. harvested endoscopicly and left internal mammary artery.;  Surgeon: Kerin PernaVan Trigt, Peter, MD;  [facility];  Service: Open Heart Surgery;  Laterality: N/A;  . EYE SURGERY     left eye surgery 04/2016  . KNEE SURGERY Left   . RIGHT/LEFT HEART CATH AND CORONARY ANGIOGRAPHY N/A 10/19/2017  Procedure: RIGHT/LEFT HEART CATH AND CORONARY ANGIOGRAPHY;  Surgeon: Swazil[place], [name], MD;  Location: [facility] INVASIVE CV LAB;  Service: Cardiovascular;  Laterality: N/A;  . SHOULDER SURGERY Right   . TEE WITHOUT CARDIOVERSION N/A 11/29/2017   Procedure: TRANSESOPHAGEAL ECHOCARDIOGRAM (TEE);  Surgeon: Donata Clay, Theron Arista, MD;  Location[facility];  Service: Open Heart Surgery;  Laterality: N/A;  . TOE AMPUTATION Left 11/28/15   4th toe    Social History: Social History   Tobacco Use  . Smoking status: Never Smoker  . Smokeless tobacco: Never Used  Substance Use Topics  . Alcohol use: No  . Drug use: No   Additional social history:   Please also refer to relevant sections of EMR.  Family History: Family History  Problem Relation Age of Onset  . Cancer Paternal  Grandmother   . COPD Mother   . Heart failure Mother   . Anxiety disorder Mother   . Depression Mother   . Fibromyalgia Mother   . Esophageal cancer Father        Smoker, still does  . Diabetes Sister   . Fibromyalgia Sister   . Diabetes Maternal Uncle   . Diabetes Maternal Grandmother   . GER disease Sister   . Liver disease Paternal Grandfather   . Hemachromatosis Paternal Grandfather   . Diabetes Maternal Grandfather    (If not completed, MUST add something in)  Allergies and Medications: No Known Allergies No current facility-administered medications on file prior to encounter.    Current Outpatient Medications on File Prior to Encounter  Medication Sig Dispense Refill  . acetaminophen (TYLENOL) 500 MG tablet Take 2 tablets (1,000 mg total) by mouth every 6 (six) hours as needed for mild pain or fever. 30 tablet 0  . aspirin EC 325 MG EC tablet Take 1 tablet (325 mg total) by mouth daily. 30 tablet 0  . Ferrous Sulfate (IRON) 325 (65 Fe) MG TABS Take 1 tablet (325 mg total) by mouth 2 (two) times daily after a meal. 30 each 0  . furosemide (LASIX) 40 MG tablet Take 1 tablet (40 mg total) by mouth daily. 5 tablet 0  . gabapentin (NEURONTIN) 800 MG tablet Take 1 tablet (800 mg total) by mouth 3 (three) times daily. 90 tablet 5  . insulin detemir (LEVEMIR) 100 UNIT/ML injection Inject 0.2 mLs (20 Units total) into the skin at bedtime. 30 mL 3  . metoCLOPramide (REGLAN) 10 MG tablet Take 1 tablet (10 mg total) by mouth 4 (four) times daily -  before meals and at bedtime. 90 tablet 1  . metoprolol succinate (TOPROL XL) 25 MG 24 hr tablet Take 1 tablet (25 mg total) by mouth daily. 30 tablet 5  . oxyCODONE (OXY IR/ROXICODONE) 5 MG immediate release tablet Take 1-2 tablets (5-10 mg total) by mouth every 4 (four) hours as needed for severe pain. 30 tablet 0  . potassium chloride SA (K-DUR,KLOR-CON) 20 MEQ tablet Take 1 tablet (20 mEq total) by mouth daily. 5 tablet 0  . ranitidine  (ZANTAC) 150 MG tablet Take 1 tablet 2 (two) times daily by mouth.  12  . rosuvastatin (CRESTOR) 40 MG tablet Take 1 tablet (40 mg total) by mouth daily. 30 tablet 5    Objective: BP (!) 145/72 (BP Location: Right Arm)   Pulse 92   Temp 98.7 F (37.1 C) (Oral)   Resp (!) 21   Ht 6\' 2"  (1.88 m)   Wt 237 lb (107.5 kg)   SpO2 95%  BMI 30.43 kg/m  Exam: Physical Exam  Constitutional: He is oriented to person, place, and time. He appears well-developed and well-nourished.  HENT:  Head: Normocephalic and atraumatic.  Eyes: Conjunctivae and EOM are normal.  Neck: Normal range of motion. Neck supple.  Cardiovascular: Normal rate, regular rhythm and normal heart sounds.  Pulmonary/Chest: Effort normal and breath sounds normal. No respiratory distress.  Abdominal: Soft. Bowel sounds are normal. He exhibits no distension. There is no tenderness.  Musculoskeletal: Normal range of motion. He exhibits no edema.  Neurological: He is alert and oriented to person, place, and time.  Skin: Skin is warm and dry.  Psychiatric: He has a normal mood and affect. His behavior is normal.    Labs and Imaging: CBC BMET  Recent Labs  Lab 12/17/17 1815  WBC 9.5  HGB 10.0*  HCT 31.4*  PLT 501*   Recent Labs  Lab 12/17/17 1815  NA 133*  K 4.3  CL 99*  CO2 23  BUN 8  CREATININE 0.88  GLUCOSE 153*  CALCIUM 9.1     X-ray Chest Pa And Lateral  Result Date: 12/18/2017 CLINICAL DATA:  47 y/o  M; cough, nausea, vomiting, chills. EXAM: CHEST  2 VIEW COMPARISON:  12/02/2017 chest radiograph FINDINGS: Stable cardiac silhouette given projection and technique. Post CABG, sternotomy wires are aligned. Stable granuloma projecting over right costal diaphragmatic angle. Clear right lung. Increased small left pleural effusion and left basilar opacity. Bones are unremarkable. IMPRESSION: Increased small left pleural effusion and left basilar opacity probably representing associated atelectasis. Pneumonia not  excluded. Electronically Signed   By: Mitzi Hansen M.D.   On: 12/18/2017 02:07      Lennox Solders, MD 12/18/2017, 3:33 AM PGY-1, [facility] FPTS Intern pager: [phone], text pages welcome  UPPER LEVEL ADDENDUM  I have read the above note and made revisions highlighted in blue.  Noralee Chars, MD, PGY-3 [facility]

## 2017-12-18 NOTE — ED Notes (Signed)
flexi seal placed by Elliot GurneyWoody, RN and Bosie ClosJudith, [place].

## 2017-12-18 NOTE — ED Notes (Signed)
Patient transported to X-ray 

## 2017-12-18 NOTE — ED Notes (Signed)
Dr. Frances FurbishWinfrey contacted this RN regarding page about placing flexi seal and testing for Cdiff since patient is having ongoing diarrhea, MD advised she is fine with flexi seal being placed and will order Cdiff testing as well.

## 2017-12-18 NOTE — ED Notes (Signed)
MD Hensel aware of pt pain 3/10 to chest area that he states "from hiccups."

## 2017-12-18 NOTE — ED Notes (Signed)
Patient having "heart burn" left side of chest.  Sister said the admitting MD had been in his room and is aware of the heart burn.  Patient had active orders for reglan and it was given.  RN called admitting MD, Dr. Frances FurbishWinfrey is aware of the heart burn and ordered GI cocktail and protonix.

## 2017-12-18 NOTE — ED Notes (Signed)
Leslie in micro lab advised GI panel pcr could be added onto cdiff sample that was sent down earlier this am.

## 2017-12-19 ENCOUNTER — Telehealth: Payer: Self-pay | Admitting: Cardiovascular Disease

## 2017-12-19 LAB — GLUCOSE, CAPILLARY
GLUCOSE-CAPILLARY: 149 mg/dL — AB (ref 65–99)
Glucose-Capillary: 106 mg/dL — ABNORMAL HIGH (ref 65–99)
Glucose-Capillary: 106 mg/dL — ABNORMAL HIGH (ref 65–99)

## 2017-12-19 MED ORDER — PANTOPRAZOLE SODIUM 40 MG PO TBEC: 40.0000 mg | DELAYED_RELEASE_TABLET | Freq: Every day | ORAL | Status: DC

## 2017-12-19 NOTE — Discharge Instructions (Signed)
Thank you for allowing us to participate in your care!  You were managed for an exacerbation of your gastroparesis.  You tested negative for C. Difficile but we are waiting on other results that tested for other infectious sources of your diarrhea.  We will let you know if you test positive for any of these.  Please continue your home medications.

## 2017-12-19 NOTE — Telephone Encounter (Signed)
Left message to call back  

## 2017-12-19 NOTE — Telephone Encounter (Signed)
Travis [name] ( Sister) is returning your call. Thanks

## 2017-12-19 NOTE — Telephone Encounter (Signed)
New Message     Pt was seen Friday by Corine ShelterLuke Kilroy and he wanted him to call with a update.   [facility] due to Gastroparesis , chest xray was clear

## 2017-12-19 NOTE — Discharge Summary (Signed)
Family Medicine Teaching [facility] Discharge Summary  Patient name: Travis [name] Medical record number: [id number] Date of birth: 15-Apr-1971 Age: 47 y.o. Gender: male Date of Admission: 12/17/2017  Date of Discharge: 12/19/17 Admitting Physician: Moses Manners, MD  Primary Care Provider: Wendee Beavers, DO Consultants: none  Indication for Hospitalization: gastroparesis exacerbation  Discharge Diagnoses/Problem List:  Chronic gastroparesis T2DM, well-controlled Esophageal reflux HLD CAD s/p CABG CHF  Disposition: home  Discharge Condition: stable, improved  Discharge Exam: please see progress note from day of discharge  Brief Hospital Course:  Travis [name] was admitted on 1/6 after a two-day history of increased nausea and vomiting that made it impossible to take his usual reglan or to hold down fluids.  While in the ED, he began to have profuse non-bloody diarrhea that required a rectal tube.  He has a history of gastroparesis due to T2DM and has been admitted in the past for this issue.  He was given a GI cocktail at his request as well as IV reglan and IV protonix.  He also received oxycodone IR PRN to alleviate the pain from his recent CABG on 11/30/17.  He tested negative for C. Diff by PCR, and GI panel was also negative.  His troponin was slightly elevated at 0.06 but remained stable, so this elevation was thought to be due to his recent surgery since he did not have chest pain or changes in his EKG.  A CXR also showed LLL atelectasis or pneumonia, but patient was not treated for pneumonia since he did not have a cough or hypoxia and had a one-time fever of 100.6 only.  On 1/7, he was able to tolerate a soft diet and was requesting to go home since his nausea, vomiting, and diarrhea had improved.  He was discharged on his home medications.  Issues for Follow Up:  1. Patient is on a high dose of Reglan (10 mg QID), which increases his risk for tardive dyskinesia.  He may  need this dose due to his gastroparesis, but it may be worth weaning down to a lower dose if possible. 2. Patient's current atelectasis puts him at greater risk of developing pneumonia.  It would be advisable to ask patient about cough and shortness of breath and to encourage frequent activity so that atelectasis improves. 3. Patient has a history of depression and anxiety and was anxious during admission.  May benefit from PHQ-9.  Significant Procedures: none  Significant Labs and Imaging:  Recent Labs  Lab 12/17/17 1815 12/18/17 0256  WBC 9.5 11.7*  HGB 10.0* 9.3*  HCT 31.4* 29.5*  PLT 501* 441*   Recent Labs  Lab 12/17/17 1815 12/18/17 0256  NA 133* 134*  K 4.3 3.8  CL 99* 102  CO2 23 22  GLUCOSE 153* 162*  BUN 8 12  CREATININE 0.88 0.97  CALCIUM 9.1 8.8*  ALKPHOS 126  --   AST 25  --   ALT 19  --   ALBUMIN 3.4*  --     X-ray Chest Pa And Lateral  Result Date: 12/18/2017 CLINICAL DATA:  48 y/o  M; cough, nausea, vomiting, chills. EXAM: CHEST  2 VIEW COMPARISON:  12/02/2017 chest radiograph FINDINGS: Stable cardiac silhouette given projection and technique. Post CABG, sternotomy wires are aligned. Stable granuloma projecting over right costal diaphragmatic angle. Clear right lung. Increased small left pleural effusion and left basilar opacity. Bones are unremarkable. IMPRESSION: Increased small left pleural effusion and left basilar opacity probably representing associated  atelectasis. Pneumonia not excluded. Electronically Signed   By: Mitzi HansenLance  Furusawa-Stratton M.D.   On: 12/18/2017 02:07      Results/Tests Pending at Time of Discharge: none  Discharge Medications:  Allergies as of 12/19/2017   No Known Allergies     Medication List    TAKE these medications   acetaminophen 500 MG tablet Commonly known as:  TYLENOL Take 2 tablets (1,000 mg total) by mouth every 6 (six) hours as needed for mild pain or fever.   aspirin 325 MG EC tablet Take 1 tablet (325 mg  total) by mouth daily.   furosemide 40 MG tablet Commonly known as:  LASIX Take 1 tablet (40 mg total) by mouth daily.   gabapentin 800 MG tablet Commonly known as:  NEURONTIN Take 1 tablet (800 mg total) by mouth 3 (three) times daily.   insulin detemir 100 UNIT/ML injection Commonly known as:  LEVEMIR Inject 0.2 mLs (20 Units total) into the skin at bedtime.   Iron 325 (65 Fe) MG Tabs Take 1 tablet (325 mg total) by mouth 2 (two) times daily after a meal.   metoCLOPramide 10 MG tablet Commonly known as:  REGLAN Take 1 tablet (10 mg total) by mouth 4 (four) times daily -  before meals and at bedtime.   metoprolol succinate 25 MG 24 hr tablet Commonly known as:  TOPROL XL Take 1 tablet (25 mg total) by mouth daily.   oxyCODONE 5 MG immediate release tablet Commonly known as:  Oxy IR/ROXICODONE Take 1-2 tablets (5-10 mg total) by mouth every 4 (four) hours as needed for severe pain.   potassium chloride SA 20 MEQ tablet Commonly known as:  K-DUR,KLOR-CON Take 1 tablet (20 mEq total) by mouth daily.   ranitidine 150 MG tablet Commonly known as:  ZANTAC Take 1 tablet 2 (two) times daily by mouth.   rosuvastatin 40 MG tablet Commonly known as:  CRESTOR Take 1 tablet (40 mg total) by mouth daily.       Discharge Instructions: Please refer to Patient Instructions section of EMR for full details.  Patient was counseled important signs and symptoms that should prompt return to medical care, changes in medications, dietary instructions, activity restrictions, and follow up appointments.   Follow-Up Appointments: Follow-up Information    Wendee BeaversMcMullen, David J, DO. Go on 12/22/2017.   Specialty:  Family Medicine Why:  Please go to appointment at 1:50PM Contact information: [address] [place] [address] [phone]        Kerin PernaVan Trigt, Peter, MD. Go on 01/04/2018.   Specialty:  Cardiothoracic Surgery Why:  Please go to appointment at 10:30AM Contact information: [address] [place] [address] [phone]        Chilton Siandolph, Tiffany, MD. Go on 01/13/2018.   Specialty:  Cardiology Why:  Please go to appointment at 10:00AM. Contact information: [address] [place] [address] [phone]           Lennox SoldersWinfrey, Amanda C, MD 12/19/2017, 2:06 PM PGY-1, [facility]

## 2017-12-19 NOTE — Progress Notes (Signed)
FPTS Interim Progress Note  Patient is doing well. Emesis resolved. Plan for d/c home later today. Has f/u with me on Thursday. Appreciate great care FPTS is providing. Discussed plan with family.  Wendee BeaversMcMullen, David J, DO 12/19/2017, 12:05 PM PGY-2,  Family Medicine Service pager: [phone]

## 2017-12-19 NOTE — Telephone Encounter (Signed)
Returned call to sister-sister states patient is currently in hospital with gastroparesis (which he has had before) and they did cxray and it was negative.     PLAN  I suggested a CXR today but the pt doesn't feel up to that. Im going to put him back on Lasix 40 mg daily x 5 days. He'll call Monday if he is not improved and if not he'll need a CXR then.  Corine ShelterLuke Kilroy PA-C 12/16/2017 11:35 AM   Advised I would make PA aware.    Sister verbalized understanding.

## 2017-12-19 NOTE — Progress Notes (Signed)
Family Medicine Teaching Service Daily Progress Note Intern Pager: [phone]  Patient name: Travis CabalMichael T Zeidan Medical record number: [id number] Date of birth: 09/28/1971 Age: 47 y.o. Gender: male  Primary Care Provider: Wendee BeaversMcMullen, David J, DO Consultants: none Code Status: partial  Pt Overview and Major Events to Date:  1/6 admitted  Assessment and Plan:  Travis [name] is a 47 y.o. male presenting with intractable nausea and vomiting as well as diarrhea in the setting of chronic gastroparesis. PMH is significant for T2DM, esophageal reflux, HLD, CAD s/p CABG in November 30, 2017, CHF, and diabetic neuropathy.  Nausea, vomiting, and diarrhea likely 2/2 gastroparesis: Patient has a long-standing history of gastroparesis which is managed with reglan at home, but his nausea and vomiting have intensified over the past few days, and he was unable to keep down his reglan.  He has also had three loose stools since arriving to the ED.  His home regimen is reglan 10 mg QID.  BMP wnl.  This is likely an exacerbation of his gastroparesis, but other possibilities include viral gastroenteritis, SBO, cyclical vomiting syndrome.  Abdominal exam is benign, therefore less concerning for SBO. Nausea has been the primary symptom, making gastroparesis most likely especially given his history.  C. Diff PCR negative. - NS @ 10 ml/hr - ADAT, currently soft diet - reglan IV 10 mg QID - GI cocktail - IV protonix - GI panel pending - restart home meds when able - d/c flexiseal  T2DM, well controlled: Last A1c on 11/28/17 is 7.0.  Takes Levemir 20 units once daily. - hold home Levemir, restart at reduced dose as needed  - SSI moderate Q4H - CBG Q4H  Esophageal reflux: Patient takes zantac at home - IV protonix - Provided a GI cocktail   HLD: Last lipid panel on record is from 2017.  Takes crestor 20 mg at home. - hold home crestor due to patient's nausea and vomiting  CAD s/p CABG: Patient with a  history of MI 2-3 years ago.  3 vessel CABG performed in December 2018.  Takes metoprolol 25 mg daily. EKG initial with artifact, repeat EKG without any significant changes similar to previous  - restart metoprolol  - consider letting CVTS know about patient presenting given CABG two weeks ago - oxy IR 5 mg QID PRN for chest wall pain due to recent CABG  CHF: Echo in December showed and EF of 45-55%. Just restarted on lasix 40 mg daily at home this Friday, as Cardiology with concern about crackles in lung fields, euvolemic on exam, dry weight of 220 lbs.  CXR with atelectasis vs PNA in [facility].  - daily weights  - hold home Lasix  FEN/GI: clear liquid diet, protonix Prophylaxis: lovenox  Disposition: home hopefully today - need to restart patient meds and d/c flexiseal  Subjective:  Patient says he is feeling better with less nausea, especially when he has food on his stomach.  He was upset because the wrong breakfast was brought to him this morning.  He would like to go home today before noon so that he can sleep and recover.  Objective: Temp:  [98.1 F (36.7 C)-98.3 F (36.8 C)] 98.3 F (36.8 C) (01/07 00930614) Pulse Rate:  [75-89] 82 (01/07 0614) Resp:  [15-19] 17 (01/07 0614) BP: (105-136)/(52-71) 132/68 (01/07 0614) SpO2:  [92 %-100 %] 100 % (01/07 0614) Weight:  [224 lb 3.3 oz (101.7 kg)-237 lb (107.5 kg)] 224 lb 3.3 oz (101.7 kg) (01/07 0422) Physical Exam: Physical Exam  Constitutional: He appears well-developed and well-nourished.  Appears mildly anxious  HENT:  Head: Normocephalic and atraumatic.  Eyes: EOM are normal.  Neck: Normal range of motion.  Cardiovascular: Normal rate, regular rhythm and normal heart sounds.  No murmur heard. Healing incision from CABG  Pulmonary/Chest: Effort normal and breath sounds normal.  Abdominal: Soft. Bowel sounds are normal. There is no tenderness.  Musculoskeletal: Normal range of motion.     Laboratory: Recent Labs  Lab  12/17/17 1815 12/18/17 0256  WBC 9.5 11.7*  HGB 10.0* 9.3*  HCT 31.4* 29.5*  PLT 501* 441*   Recent Labs  Lab 12/17/17 1815 12/18/17 0256  NA 133* 134*  K 4.3 3.8  CL 99* 102  CO2 23 22  BUN 8 12  CREATININE 0.88 0.97  CALCIUM 9.1 8.8*  PROT 6.8  --   BILITOT 1.5*  --   ALKPHOS 126  --   ALT 19  --   AST 25  --   GLUCOSE 153* 162*      Imaging/Diagnostic Tests: X-ray Chest Pa And Lateral  Result Date: 12/18/2017 CLINICAL DATA:  47 y/o  M; cough, nausea, vomiting, chills. EXAM: CHEST  2 VIEW COMPARISON:  12/02/2017 chest radiograph FINDINGS: Stable cardiac silhouette given projection and technique. Post CABG, sternotomy wires are aligned. Stable granuloma projecting over right costal diaphragmatic angle. Clear right lung. Increased small left pleural effusion and left basilar opacity. Bones are unremarkable. IMPRESSION: Increased small left pleural effusion and left basilar opacity probably representing associated atelectasis. Pneumonia not excluded. Electronically Signed   By: Mitzi Hansen M.D.   On: 12/18/2017 02:07     Lennox Solders, MD 12/19/2017, 8:28 AM PGY-1, [facility] FPTS Intern pager: [phone], text pages welcome

## 2017-12-21 NOTE — Progress Notes (Deleted)
   Subjective   Patient ID: Travis CabalMichael T Dieckman    DOB: 04/11/1971, 47 y.o. male   MRN: [id number]  CC: "Hospital follow-up"  HPI: Travis [name] is a 47 y.o. male who presents to clinic today for the following:  ***: ***  1. Patient is on a high dose of Reglan (10 mg QID), which increases his risk for tardive dyskinesia.  He may need this dose due to his gastroparesis, but it may be worth weaning down to a lower dose if possible. 2. Patient's current atelectasis puts him at greater risk of developing pneumonia.  It would be advisable to ask patient about cough and shortness of breath and to encourage frequent activity so that atelectasis improves. 3. Patient has a history of depression and anxiety and was anxious during admission.  May benefit from PHQ-9.  ROS: see HPI for pertinent.  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 Family history COPD, heart failure, esophogeal cancer (fatehr), fibromyalgia, DM. Smoking status reviewed. Medications reviewed.  Objective   There were no vitals taken for this visit. Vitals and nursing note reviewed.  General: well nourished, well developed, NAD with non-toxic appearance HEENT: normocephalic, atraumatic, moist mucous membranes Neck: supple, non-tender without lymphadenopathy Cardiovascular: regular rate and rhythm without murmurs, rubs, or gallops Lungs: clear to auscultation bilaterally with normal work of breathing Abdomen: soft, non-tender, non-distended, normoactive bowel sounds Skin: warm, dry, no rashes or lesions, cap refill < 2 seconds Extremities: warm and well perfused, normal tone, no edema  Assessment & Plan   No problem-specific Assessment & Plan notes found for this  encounter.  No orders of the defined types were placed in this encounter.  No orders of the defined types were placed in this encounter.   Durward Parcelavid McMullen, DO [facility], PGY-2 12/21/2017, 4:49 PM

## 2017-12-22 ENCOUNTER — Telehealth: Payer: Self-pay | Admitting: Family Medicine

## 2017-12-22 ENCOUNTER — Ambulatory Visit: Payer: Medicaid Other | Admitting: Family Medicine

## 2017-12-22 NOTE — Telephone Encounter (Signed)
Pt sister called to cancel the appointment today for this patient and would like McMullen to call her about it. She said the pt just got out of the hospital and the pt isnt able to make it to our office today. Please call Jacki ConesLaurie at [phone].

## 2017-12-22 NOTE — Telephone Encounter (Signed)
Returned call. See note. Thanks.

## 2017-12-22 NOTE — Telephone Encounter (Signed)
Return call with patient's sister (primary caregiver).  Patient recently discharged for gastroparesis exacerbation.  Scheduled to follow-up with me today but cannot make it.  Patient's sister states that he is continuing to have nausea without vomiting and is able to tolerate fluids.  He is not made much effort with eating.  The ride home from the hospital exacerbated his nausea and he is afraid of making his symptoms worse coming to the clinic for follow-up.  He is continuing his Reglan.  Sister believes cessation of Reglan for several days prior to CABG may have been the source of his exacerbation.  Patient is without chest pain or shortness of breath.  Discussed red flags with sister.  Rescheduled for hospital follow-up on 12/27/2017 at 2:30 PM.  Durward Parcelavid Jhamari Markowicz, DO [facility], PGY-2

## 2017-12-27 ENCOUNTER — Encounter: Payer: Self-pay | Admitting: Family Medicine

## 2017-12-27 ENCOUNTER — Ambulatory Visit (INDEPENDENT_AMBULATORY_CARE_PROVIDER_SITE_OTHER): Payer: Medicaid Other | Admitting: Family Medicine

## 2017-12-27 ENCOUNTER — Other Ambulatory Visit: Payer: Self-pay

## 2017-12-27 VITALS — BP 98/72 | HR 76 | Temp 97.3°F | Ht 75.0 in | Wt 223.8 lb

## 2017-12-27 DIAGNOSIS — E118 Type 2 diabetes mellitus with unspecified complications: Secondary | ICD-10-CM | POA: Diagnosis not present

## 2017-12-27 DIAGNOSIS — Z794 Long term (current) use of insulin: Secondary | ICD-10-CM

## 2017-12-27 DIAGNOSIS — E1143 Type 2 diabetes mellitus with diabetic autonomic (poly)neuropathy: Secondary | ICD-10-CM | POA: Diagnosis present

## 2017-12-27 DIAGNOSIS — K3184 Gastroparesis: Secondary | ICD-10-CM | POA: Diagnosis not present

## 2017-12-27 DIAGNOSIS — IMO0001 Reserved for inherently not codable concepts without codable children: Secondary | ICD-10-CM

## 2017-12-27 DIAGNOSIS — F418 Other specified anxiety disorders: Secondary | ICD-10-CM

## 2017-12-27 NOTE — Progress Notes (Signed)
   Subjective   Patient ID: Travis CabalMichael T Enrico    DOB: 05/18/1971, 47 y.o. male   MRN: [id number]  CC: "Hospital follow-up"  HPI: Travis [name] is a 47 y.o. male who presents to clinic today for the following:  Gastroparesis: Patient required hospitalization for gastroparesis exacerbation between 12/17/17-12/19/17.  He recently underwent CABG for severe three-vessel CAD.  It is suspected that this may have been the source of his gastroparesis.  He has been on Reglan 10 mg 1-4 times daily for the last 1.5 years.  He is followed by [facility].  He usually has an exacerbation every year.  He has resumed his regular diet with 3 meals daily and snacks between meals.  His nausea has resolved approximately 1 week ago.  He feels back to baseline.  Diabetes: CBGs average 115-125.  Patient denies highs or lows.  He checks his glucose regularly while advancing his diet since his hospitalization.  Anxiety and depression: Patient states he has undergone a lot of medical procedures and hospitalizations which has made his depression and anxiety worse.  He currently feels better now that he is out of the hospital and is finished with the CABG.  He would like to follow-up in 1 month to discuss his symptoms.  He continues to be without SI/HI.  ROS: see HPI for pertinent.  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 Family history COPD, heart failure, esophogeal cancer (fatehr), fibromyalgia, DM. Smoking status reviewed. Medications reviewed.  Objective   BP 98/72   Pulse 76   Temp (!) 97.3 F (36.3 C) (Oral)   Ht 6\' 3"  (1.905 m)   Wt 223 lb 12.8 oz (101.5 kg)   SpO2 96%   BMI 27.97 kg/m  Vitals and nursing note reviewed.  General: male, NAD with non-toxic  appearance HEENT: normocephalic, atraumatic, moist mucous membranes Cardiovascular: regular rate and rhythm without murmurs, rubs, or gallops Lungs: clear to auscultation bilaterally with normal work of breathing Abdomen: soft, non-tender, non-distended, normoactive bowel sounds Skin: warm, dry, no rashes or lesions, cap refill < 2 seconds, midline scar on chest from prior CABG with mild tenderness without erythema or bleeding Extremities: warm and well perfused, normal tone, no edema  Assessment & Plan   Insulin dependent diabetes mellitus with complications (HCC) A  Gastroparesis due to DM (HCC) A  Anxiety with depression A  No orders of the defined types were placed in this encounter.  No orders of the defined types were placed in this encounter.   Durward Parcelavid McMullen, DO [facility], PGY-2 12/27/2017, 3:53 PM

## 2017-12-27 NOTE — Assessment & Plan Note (Addendum)
Chronic. Controlled. A1c 7.0. CBG in low 100s. Tolerating regular diet. - Continue Levemir 20 units daily and metformin 1000 mg twice daily - RTC 2 months

## 2017-12-27 NOTE — Assessment & Plan Note (Addendum)
Chronic. Appears more opptunistic today though PHQ-9 score 22, extremely difficult and GAD-7 score 15, extremely difficult. Interested in waiting until disability hearing is done in a few weeks. - RTC 1 month

## 2017-12-27 NOTE — Assessment & Plan Note (Addendum)
Acute on chronic. Resolved. Likely d/t recent CABG. Tolerating regular diet now while on Reglan.  - Discussed importance of keeping Reglan dose low to 10 mg 2-3 times daily if possible and side effects of tardive dyskinesia - Patient will discuss medication with GI and next appointment

## 2017-12-27 NOTE — Patient Instructions (Signed)
Thank you for coming in to see us today. Please see below to review our plan for today's visit.  1.  I do believe your gastroparesis exacerbation may have been secondary to your recent surgery. Continue taking your Reglan as needed.  You can discuss other options with your GI specialist during your next visit.  Continue to drink plenty of water and eat meals regularly.  It is also important that you continue to control your blood sugars to prevent exacerbations. 2.  I would like to see you in approximately a month, ideally after your hearing.  We will follow-up on your anxiety and depression at that time.  You can schedule appointment earlier if needed.  Please call the clinic at [phone] if your symptoms worsen or you have any concerns. It was our pleasure to serve you.  Durward Parcelavid McMullen, DO [facility], PGY-2

## 2018-01-03 ENCOUNTER — Other Ambulatory Visit: Payer: Self-pay | Admitting: Cardiothoracic Surgery

## 2018-01-03 DIAGNOSIS — I2510 Atherosclerotic heart disease of native coronary artery without angina pectoris: Secondary | ICD-10-CM

## 2018-01-04 ENCOUNTER — Ambulatory Visit: Payer: Self-pay | Admitting: Cardiothoracic Surgery

## 2018-01-09 ENCOUNTER — Telehealth: Payer: Self-pay

## 2018-01-09 ENCOUNTER — Other Ambulatory Visit: Payer: Self-pay | Admitting: Family Medicine

## 2018-01-09 MED ORDER — OSELTAMIVIR PHOSPHATE 75 MG PO CAPS: 75.0000 mg | ORAL_CAPSULE | Freq: Every day | ORAL | 0 refills | Status: AC

## 2018-01-09 NOTE — Telephone Encounter (Signed)
Prescription for Tamiflu 75 mg daily for 7 days sent into pharmacy.  Left voicemail informing patient prescription available for pickup.  Durward Parcelavid McMullen, DO [facility], PGY-2

## 2018-01-09 NOTE — Telephone Encounter (Signed)
Received call from Francesca JewettLaurie Phelps on nurse line. Patient has been exposed to flu via one of his kids. [facility] a [facility]. Ples SpecterAlisa Shirlyn Savin, RN [facility] RN)

## 2018-01-12 ENCOUNTER — Other Ambulatory Visit: Payer: Self-pay | Admitting: *Deleted

## 2018-01-12 DIAGNOSIS — Z951 Presence of aortocoronary bypass graft: Secondary | ICD-10-CM

## 2018-01-13 ENCOUNTER — Ambulatory Visit: Payer: Medicaid Other | Admitting: Cardiovascular Disease

## 2018-01-16 ENCOUNTER — Ambulatory Visit: Payer: Self-pay

## 2018-01-17 ENCOUNTER — Ambulatory Visit (INDEPENDENT_AMBULATORY_CARE_PROVIDER_SITE_OTHER): Payer: Self-pay | Admitting: Physician Assistant

## 2018-01-17 ENCOUNTER — Ambulatory Visit
Admission: RE | Admit: 2018-01-17 | Discharge: 2018-01-17 | Disposition: A | Discharge status: Home / Self Care | Payer: Medicaid Other | Source: Ambulatory Visit | Attending: Cardiothoracic Surgery | Admitting: Cardiothoracic Surgery

## 2018-01-17 VITALS — BP 100/66 | HR 77 | Resp 20 | Ht 75.0 in | Wt 219.0 lb

## 2018-01-17 DIAGNOSIS — Z951 Presence of aortocoronary bypass graft: Secondary | ICD-10-CM

## 2018-01-17 DIAGNOSIS — I25118 Atherosclerotic heart disease of native coronary artery with other forms of angina pectoris: Secondary | ICD-10-CM

## 2018-01-17 NOTE — Progress Notes (Signed)
HPI: Patient returns for routine postoperative follow-up having undergone CABG x 3  on 11/29/2017.  The patient's early postoperative recovery while in the hospital was unremarkable.  Since hospital discharge the patient reports he was doing very well until he lost his vision.  He states that he has bleeding behind he one eye which has occurred several times for him.  He states he is due to have a laser procedure which should resolve the issue.  He states otherwise he is doing well.  He would like to increase his activity level and try to lose some weight.  He is ambulating without difficulty.  His appetite are back to normal.  His incisions are mostly healed, except for a scabbed area on his right leg.   Current Outpatient Medications  Medication Sig Dispense Refill  . aspirin EC 325 MG EC tablet Take 1 tablet (325 mg total) by mouth daily. 30 tablet 0  . Ferrous Sulfate (IRON) 325 (65 Fe) MG TABS Take 1 tablet (325 mg total) by mouth 2 (two) times daily after a meal. 30 each 0  . gabapentin (NEURONTIN) 800 MG tablet Take 1 tablet (800 mg total) by mouth 3 (three) times daily. 90 tablet 5  . insulin detemir (LEVEMIR) 100 UNIT/ML injection Inject 0.2 mLs (20 Units total) into the skin at bedtime. 30 mL 3  . metoCLOPramide (REGLAN) 10 MG tablet Take 1 tablet (10 mg total) by mouth 4 (four) times daily -  before meals and at bedtime. 90 tablet 1  . metoprolol succinate (TOPROL XL) 25 MG 24 hr tablet Take 1 tablet (25 mg total) by mouth daily. 30 tablet 5  . ranitidine (ZANTAC) 150 MG tablet Take 1 tablet 2 (two) times daily by mouth.  12  . rosuvastatin (CRESTOR) 40 MG tablet Take 1 tablet (40 mg total) by mouth daily. 30 tablet 5   No current facility-administered medications for this visit.     Physical Exam:  BP 100/66   Pulse 77   Resp 20   Ht 6\' 3"  (1.905 m)   Wt 219 lb (99.3 kg)   SpO2 98% Comment: RA  BMI 27.37 kg/m   Gen: no apparent distress Heart: RRR Lungs: CTA  bilterally Ext: trace edema, Incisions; clean and dry, eschar present on right EVH site  Diagnostic Tests:  CXR: no pleural effusion, sternal wires intact  A/P:  1. S/P CABG- doing well, BP remains labile tolerating Lopressor, will refer to cardiac rehab 2. Activity- increase as tolerated patient again instructed on weight restrictions, instructed patient he may resume driving once vision issues resolve 3. RTC prn   Lowella DandyErin Barrett, PA-C [facility] [phone]

## 2018-01-23 ENCOUNTER — Encounter: Payer: Self-pay | Admitting: Physician Assistant

## 2018-01-23 ENCOUNTER — Ambulatory Visit (INDEPENDENT_AMBULATORY_CARE_PROVIDER_SITE_OTHER): Payer: Medicaid Other | Admitting: Physician Assistant

## 2018-01-23 VITALS — BP 106/66 | HR 68 | Ht 74.0 in | Wt 226.0 lb

## 2018-01-23 DIAGNOSIS — E1169 Type 2 diabetes mellitus with other specified complication: Secondary | ICD-10-CM

## 2018-01-23 DIAGNOSIS — E785 Hyperlipidemia, unspecified: Secondary | ICD-10-CM | POA: Diagnosis not present

## 2018-01-23 DIAGNOSIS — I2510 Atherosclerotic heart disease of native coronary artery without angina pectoris: Secondary | ICD-10-CM | POA: Diagnosis not present

## 2018-01-23 DIAGNOSIS — I25810 Atherosclerosis of coronary artery bypass graft(s) without angina pectoris: Secondary | ICD-10-CM

## 2018-01-23 LAB — COMPREHENSIVE METABOLIC PANEL
ALBUMIN: 3.9 g/dL (ref 3.5–5.5)
ALT: 17 IU/L (ref 0–44)
AST: 14 IU/L (ref 0–40)
Albumin/Globulin Ratio: 1.7 (ref 1.2–2.2)
Alkaline Phosphatase: 65 IU/L (ref 39–117)
BUN/Creatinine Ratio: 13 (ref 9–20)
BUN: 13 mg/dL (ref 6–24)
Bilirubin Total: 0.5 mg/dL (ref 0.0–1.2)
CALCIUM: 9.3 mg/dL (ref 8.7–10.2)
CHLORIDE: 99 mmol/L (ref 96–106)
CO2: 24 mmol/L (ref 20–29)
CREATININE: 0.99 mg/dL (ref 0.76–1.27)
GFR, EST AFRICAN AMERICAN: 105 mL/min/{1.73_m2} (ref >59)
GFR, EST NON AFRICAN AMERICAN: 91 mL/min/{1.73_m2} (ref >59)
GLUCOSE: 169 mg/dL — AB (ref 65–99)
Globulin, Total: 2.3 g/dL (ref 1.5–4.5)
Potassium: 4.8 mmol/L (ref 3.5–5.2)
Sodium: 137 mmol/L (ref 134–144)
Total Protein: 6.2 g/dL (ref 6.0–8.5)

## 2018-01-23 LAB — LIPID PANEL
CHOL/HDL RATIO: 2.3 ratio (ref 0.0–5.0)
Cholesterol, Total: 75 mg/dL — ABNORMAL LOW (ref 100–199)
HDL: 32 mg/dL — ABNORMAL LOW (ref >39)
LDL Calculated: 29 mg/dL (ref 0–99)
Triglycerides: 68 mg/dL (ref 0–149)
VLDL Cholesterol Cal: 14 mg/dL (ref 5–40)

## 2018-01-23 MED ORDER — METOPROLOL SUCCINATE ER 25 MG PO TB24: 12.5000 mg | ORAL_TABLET | Freq: Every day | ORAL | 5 refills | Status: DC

## 2018-01-23 NOTE — Patient Instructions (Signed)
Medication Instructions:   DECREASE METOPROLOL TO 12.5 MG ONCE DAILY=1/2 OF THE 25 MG TABLET ONCE DAILY  Follow-Up:  Your physician wants you to follow-up in: 6 MONTHS [facility] You will receive a reminder letter in the mail two months in advance. If you don't receive a letter, please call our office to schedule the follow-up appointment.   If you need a refill on your cardiac medications before your next appointment, please call your pharmacy.

## 2018-01-23 NOTE — Progress Notes (Signed)
Cardiology Office Note   Date:  01/23/2018   ID:  Travis [name], DOB 18-Jan-1971, MRN [id number]  PCP:  Wendee Beavers, DO  Cardiologist: Dr. Duke Salvia, 10/13/2017 Theodore Demark, PA-C   Chief Complaint  Patient presents with  . Follow-up    History of Present Illness: Travis [name] is a 47 y.o. male with a history of CABG 11/29/2017 w/ LIMA-LAD, SVG-PDA, SVG-CFX. Hx IDDM, HTN, HLD, S-D-CHF, GERD, gastroparesis, LV aneurysm  12/16/2017 office visit, Lasix restarted at 40 mg x 5 days Admitted 12/17/2017-12/19/2017 for gastroparesis exacerbation, symptoms nausea vomiting and diarrhea 01/17/2018 office visit, patient describes vision loss from bleeding behind one eye, due to have a laser procedure  Travis [name] presents for cardiology follow up.  His sister is with him today.  He is not having any chest pain.   He is having balance problems and vision problems, so is not able to walk much.  He walks around some in the house.  He is to have eye surgery on the 21st.  He was not told to hold his aspirin.  He is not having DOE, denies LE edema, orthopnea or PND.  He is not having palpitations, presyncope or orthostatic symptoms.  He is having problems with fatigue.  Sometimes his systolic blood pressure is less than 100.  He needs his gallbladder out, also has gastroparesis. Since his hospitalization in January, his stomach and GB have not given him trouble. He was told no surgery x 1 year after CABG because of blood thinners.  He has an area of dry skin on his right leg that has not been evaluated.  He has been turned loose by the surgeons.  He was told they were referring him to cardiac rehab.  He has not heard from cardiac rehab.  He has had 2 toes amputated in the past.  However, he does not have claudication symptoms, no numbness in his feet.  He has a disability hearing coming up.   Past Medical History:  Diagnosis Date  . Acute on chronic systolic and diastolic heart  failure, NYHA class 3 (HCC) 09/14/2017  . Acute osteomyelitis of metatarsal bone of left foot (HCC) 11/12/2015  . CAD in native artery 09/14/2017  . Closed fracture of right tibial plateau 11/11/2015  . Depression with anxiety   . Diabetes mellitus    INSULIN DEPENDENT Type II  . Diabetic peripheral neuropathy associated with type 2 diabetes mellitus (HCC) 08/30/2008   Qualifier: Diagnosis of  By: Daphine Deutscher FNP, Zena Amos    . Diabetic ulcer of left foot (HCC) 11/14/2015  . Gastroparesis   . GERD (gastroesophageal reflux disease)   . Hypertension   . Left ventricular aneurysm   . Osteomyelitis (HCC)   . Osteoporosis 11/12/2015  . Peripheral neuropathy   . Shortness of breath dyspnea    with exertion  . Vitamin D deficiency 11/12/2015    Past Surgical History:  Procedure Laterality Date  . AMPUTATION Left 11/28/2015   Procedure: Left 4th Ray Amputation;  Surgeon: Nadara Mustard, MD;  Location: [facility] OR;  Service: Orthopedics;  Laterality: Left;  . AMPUTATION Left 10/29/2016   Procedure: LEFT 5TH RAY AMPUTATION;  Surgeon: Nadara Mustard, MD;  Location: [facility] OR;  Service: Orthopedics;  Laterality: Left;  . ANTERIOR VITRECTOMY Left 04/12/2016   Procedure: ANTERIOR CHAMBER WASH OUT WITH GAS FLUID EXCHANGE;  Surgeon: Carmela Rima, MD;  Location: [facility] OR;  Service: Ophthalmology;  Laterality: Left;  . CARDIAC CATHETERIZATION    .  CATARACT EXTRACTION Left   . CORONARY ARTERY BYPASS GRAFT N/A 11/29/2017    LIMA-LAD, SVG-PDA, SVG-CFX  Procedure: CORONARY ARTERY BYPASS GRAFTING (CABG) times three using the left saphaneous vien. harvested endoscopicly and left internal mammary artery.;  Surgeon: Kerin Perna, MD;  Location: [facility] OR;  Service: Open Heart Surgery;  Laterality: N/A;  . EYE SURGERY     left eye surgery 04/2016  . KNEE SURGERY Left   . RIGHT/LEFT HEART CATH AND CORONARY ANGIOGRAPHY N/A 10/19/2017   Procedure: RIGHT/LEFT HEART CATH AND CORONARY ANGIOGRAPHY;  Surgeon: Swaziland, [name], MD;   Location: [facility] INVASIVE CV LAB;  Service: Cardiovascular;  Laterality: N/A;  . SHOULDER SURGERY Right   . TEE WITHOUT CARDIOVERSION N/A 11/29/2017   Procedure: TRANSESOPHAGEAL ECHOCARDIOGRAM (TEE);  Surgeon: Donata Clay, Theron Arista, MD;  Location: [facility] OR;  Service: Open Heart Surgery;  Laterality: N/A;  . TOE AMPUTATION Left 11/28/15   4th toe    Current Outpatient Medications  Medication Sig Dispense Refill  . aspirin EC 325 MG EC tablet Take 1 tablet (325 mg total) by mouth daily. 30 tablet 0  . Ferrous Sulfate (IRON) 325 (65 Fe) MG TABS Take 1 tablet (325 mg total) by mouth 2 (two) times daily after a meal. 30 each 0  . gabapentin (NEURONTIN) 800 MG tablet Take 1 tablet (800 mg total) by mouth 3 (three) times daily. 90 tablet 5  . insulin detemir (LEVEMIR) 100 UNIT/ML injection Inject 0.2 mLs (20 Units total) into the skin at bedtime. 30 mL 3  . losartan (COZAAR) 25 MG tablet TK 1/2 T PO D  5  . metoCLOPramide (REGLAN) 10 MG tablet Take 1 tablet (10 mg total) by mouth 4 (four) times daily -  before meals and at bedtime. 90 tablet 1  . metoprolol succinate (TOPROL XL) 25 MG 24 hr tablet Take 1 tablet (25 mg total) by mouth daily. 30 tablet 5  . ranitidine (ZANTAC) 150 MG tablet Take 1 tablet 2 (two) times daily by mouth.  12  . rosuvastatin (CRESTOR) 40 MG tablet Take 1 tablet (40 mg total) by mouth daily. 30 tablet 5   No current facility-administered medications for this visit.     Allergies:   Patient has no known allergies.    Social History:  The patient  reports that  has never smoked. he has never used smokeless tobacco. He reports that he does not drink alcohol or use drugs.   Family History:  The patient's family history includes Anxiety disorder in his mother; COPD in his mother; Cancer in his paternal grandmother; Depression in his mother; Diabetes in his maternal grandfather, maternal grandmother, maternal uncle, and sister; Esophageal cancer in his father; Fibromyalgia in his mother  and sister; GER disease in his sister; Heart failure in his mother; Hemachromatosis in his paternal grandfather; Liver disease in his paternal grandfather.    ROS:  Please see the history of present illness. All other systems are reviewed and negative.    PHYSICAL EXAM: VS:  BP 106/66   Pulse 68   Ht 6\' 2"  (1.88 m)   Wt 226 lb (102.5 kg)   SpO2 97%   BMI 29.02 kg/m  , BMI Body mass index is 29.02 kg/m. GEN: Well nourished, well developed, male in no acute distress  HEENT: normal for age  Neck: no JVD, no carotid bruit, no masses Cardiac: RRR; no murmur, no rubs, or gallops Respiratory:  clear to auscultation bilaterally, normal work of breathing GI: soft, nontender,  nondistended, + BS MS: no deformity or atrophy; no edema; distal pulses are 2+ in all 4 extremities   Skin: warm and dry, area of dry skin with large scales noted on the end after lower left tibial area Neuro:  Strength and sensation are intact Psych: euthymic mood, full affect   EKG:  EKG is not ordered today.  Intraoperative TEE, 11/29/2017 - Left ventricle: Systolic function was mildly reduced. The   estimated ejection fraction was in the range of 45% to 50%.   Diffuse hypokinesis. There is akinesis of the apical myocardium,   with thinning and scarring of the ventricular wall. - Staged echo: Limited Post-CPB exam: Improved LV function, overall   EF remains 45-50%. The apex is still thin, akinetic, and scarred. Impressions: - There is slight LV function improvement from pre-bypass images.   No significant changes in valvular function  OPERATION: 11/29/2017 1. Coronary artery bypass grafting x3 (left internal mammary artery to     LAD, saphenous vein graft to circumflex marginal, saphenous vein     graft to posterior descending). 2. Endoscopic harvest of right leg greater saphenous vein  Recent Labs: 09/16/2017: TSH 1.740 11/30/2017: Magnesium 2.1 12/17/2017: ALT 19 12/18/2017: BUN 12; Creatinine, Ser  0.97; Hemoglobin 9.3; Platelets 441; Potassium 3.8; Sodium 134    Lipid Panel    Component Value Date/Time   CHOL 156 05/14/2009 2201   TRIG 226 (H) 05/14/2009 2201   HDL 26 (L) 05/14/2009 2201   CHOLHDL 6.0 Ratio 05/14/2009 2201   VLDL 45 (H) 05/14/2009 2201   LDLCALC 85 05/14/2009 2201     Wt Readings from Last 3 Encounters:  01/23/18 226 lb (102.5 kg)  01/17/18 219 lb (99.3 kg)  12/27/17 223 lb 12.8 oz (101.5 kg)     Other studies Reviewed: Additional studies/ records that were reviewed today include: Office notes, hospital records and testing.  ASSESSMENT AND PLAN:  1.  CAD: He is having no ischemic symptoms.  He is on good medical therapy with aspirin) full-strength), beta-blocker, high-dose statin and ARB.  However, because of fatigue and low blood pressure, I will decrease the metoprolol to 12.5 mg daily and see how this is tolerated.  He is already taking 1/2 tablet of the losartan.  I explained that we needed to try to keep both of these medications on board because of his diabetes and CAD.  2.  Dyslipidemia: He has elevated triglycerides, low HDL and elevated LDL with a goal LDL less than 70.  He is on high dose statin, check a lipid profile and complete metabolic profile today as we have no recent labs and he cannot remember the last time it was checked.  Follow-up with PCP, if they follow this going forward, that is okay.  3.  Gallbladder/GI issues: Review guidelines with MD regarding full-strength aspirin administration.  Since he is otherwise recovering well, he is to ask the surgeon if the surgery can be done while he is taking his aspirin.  If not, it will have to wait until he is cleared by MD to come off it.  I emphasized the importance of following Dr. Leonides Sake guidelines in this.   Current medicines are reviewed at length with the patient today.  The patient does not have concerns regarding medicines.  The following changes have been made:    Labs/ tests  ordered today include:    Orders Placed This Encounter  Procedures  . Lipid panel  . Comprehensive Metabolic Panel (CMET)  Disposition:   FU with Dr. Duke Salviaandolph  Signed, Theodore Demarkhonda Nancee Brownrigg, PA-C  01/23/2018 9:42 AM    [facility] Phone: [phone]; Fax: [phone]  This note was written with the assistance of speech recognition software. Please excuse any transcriptional errors.

## 2018-01-25 ENCOUNTER — Other Ambulatory Visit (HOSPITAL_COMMUNITY): Payer: Self-pay | Admitting: *Deleted

## 2018-01-25 DIAGNOSIS — Z951 Presence of aortocoronary bypass graft: Secondary | ICD-10-CM

## 2018-02-07 ENCOUNTER — Telehealth: Payer: Self-pay | Admitting: Physician Assistant

## 2018-02-07 NOTE — Telephone Encounter (Signed)
Follow Up    Patient returning phone call from nurse. Requesting call back

## 2018-02-07 NOTE — Telephone Encounter (Signed)
Patient aware of results and recommendations.   States he is doing well on reduced dose of medication.   Advised to continue to monitor and call with questions or concerns.    Patient also request follow up on Cardiac Rehab referral.   Message sent to RN with CR to follow up.     Patient verbalized understanding.

## 2018-02-14 ENCOUNTER — Telehealth (HOSPITAL_COMMUNITY): Payer: Self-pay

## 2018-02-14 NOTE — Telephone Encounter (Signed)
Attempted to call patient in regards to Cardiac Rehab - lm on vm °

## 2018-03-02 NOTE — Progress Notes (Signed)
Subjective   Patient ID: Travis [name]    DOB: 09/17/1971, 47 y.o. male   MRN: [id number]  CC: "Left arm numbness"  HPI: Travis [name] is a 47 y.o. male who presents to clinic today for the following:  Left arm numbness: Patient was taking a shower over the weekend in the afternoon and experienced sudden onset numbness of his entire left arm along with some associated weakness.  He also endorses oral numbness and felt like his tongue was swelling and had some drooling.  He was with his daughter who stated he was not slurring his speech.  Patient did not take any medications during this event and proceeded to sit down and watch TV until symptoms resolved 2 hours following.  He denies any prior history of similar episodes of stroke/TIA.  He is currently on aspirin 325 daily.  He is at his baseline today and denies any deficits.  Patient denies headaches, loss of vision, stable gait, motor weakness, sensory loss.  Depression: Patient feels less depressed today.  He has finally received his disability and has been cleared from cardiothoracic surgery for his CABG.  He is hopeful to be more active and exercise over the next few weeks.  He is not interested in any medications for meeting with therapy.  Right shoulder pain: Onset following the CABG around December.  Primarily on right shoulder radiating up to lower neck and behind right shoulder blade.  He is not tried any medications but does state it has gradually improved over the last few months.  He endorses significant stress which has been improving over the last few weeks.  ROS: see HPI for pertinent.  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 Family history COPD, heart failure, esophogeal  cancer (fatehr), fibromyalgia, DM. Smoking status reviewed. Medications reviewed.  Objective   BP 102/62   Pulse 69   Temp 97.8 F (36.6 C) (Oral)   Ht 6\' 2"  (1.88 m)   Wt 232 lb 12.8 oz (105.6 kg)   SpO2 99%   BMI 29.89 kg/m  Vitals and nursing note reviewed.  General: well nourished, well developed, NAD with non-toxic appearance HEENT: normocephalic, atraumatic, moist mucous membranes Neck: supple, non-tender without lymphadenopathy, minimal tenderness to right trapezius without midline tenderness of cervicothoracic spine Cardiovascular: regular rate and rhythm without murmurs, rubs, or gallops Lungs: clear to auscultation bilaterally with normal work of breathing Skin: warm, dry, no rashes or lesions, cap refill < 2 seconds Extremities: warm and well perfused, normal tone, no edema Neuro: CNII-XII intact, no dysarthria Psych: euthymic mood, congruent affect  Assessment & Plan   Left arm numbness Acute.  Resolved.  Concerning for possible TIA.  No prior history of stroke/TIA.  Does have several comorbidities including recent CABG and diabetes.  Endorse some feelings of palpitations around the time of event however remains asymptomatic.  Complete neuro exam unremarkable.  EKG 12-lead NSR.  - Will check TSH level - Advised patient to call 911 with any neurological change in the future - Continue Crestor 40 mg daily and aspirin 325 mg daily - Considering switching to GLP agonist for more cardioprotective therapy for diabetes and next appointment in 1 month  Anxiety with depression Chronic.  Poorly controlled based on PHQ 9 score 21, extremely difficult  and gad 7 score 20, extremely difficult though patient endorses improvement over the last few weeks.  Patient reluctant to receive pharmacotherapy or behavioral therapy at this time.  Patient is hopeful to be more active over the next few weeks given improvement following CABG several months ago.  No signs of SI/HI risk. - Advised  patient to be more active consider alternative therapy in the future  Strain of right trapezius muscle Chronic.  Likely secondary to stress.  Seems to primarily affect trapezius muscle sparing spine.   - Educated patient to use decreased stress techniques including deep breathing and to begin exercising more regularly - Advised patient to avoid NSAIDs given recent CABG  Orders Placed This Encounter  Procedures  . TSH  . HgB A1c  . EKG 12-Lead   No orders of the defined types were placed in this encounter.   Durward Parcel, DO [facility], PGY-2 03/03/2018, 4:01 PM

## 2018-03-03 ENCOUNTER — Encounter: Payer: Self-pay | Admitting: Family Medicine

## 2018-03-03 ENCOUNTER — Other Ambulatory Visit: Payer: Self-pay

## 2018-03-03 ENCOUNTER — Ambulatory Visit (HOSPITAL_COMMUNITY)
Admission: RE | Admit: 2018-03-03 | Discharge: 2018-03-03 | Disposition: A | Discharge status: Home / Self Care | Payer: Medicaid Other | Source: Ambulatory Visit | Attending: Family Medicine | Admitting: Family Medicine

## 2018-03-03 ENCOUNTER — Ambulatory Visit (INDEPENDENT_AMBULATORY_CARE_PROVIDER_SITE_OTHER): Payer: Medicaid Other | Admitting: Family Medicine

## 2018-03-03 VITALS — BP 102/62 | HR 69 | Temp 97.8°F | Ht 74.0 in | Wt 232.8 lb

## 2018-03-03 DIAGNOSIS — R200 Anesthesia of skin: Secondary | ICD-10-CM | POA: Diagnosis not present

## 2018-03-03 DIAGNOSIS — S46811A Strain of other muscles, fascia and tendons at shoulder and upper arm level, right arm, initial encounter: Secondary | ICD-10-CM | POA: Insufficient documentation

## 2018-03-03 DIAGNOSIS — R002 Palpitations: Secondary | ICD-10-CM | POA: Insufficient documentation

## 2018-03-03 DIAGNOSIS — X58XXXA Exposure to other specified factors, initial encounter: Secondary | ICD-10-CM | POA: Diagnosis not present

## 2018-03-03 DIAGNOSIS — F418 Other specified anxiety disorders: Secondary | ICD-10-CM | POA: Insufficient documentation

## 2018-03-03 DIAGNOSIS — E1142 Type 2 diabetes mellitus with diabetic polyneuropathy: Secondary | ICD-10-CM | POA: Diagnosis not present

## 2018-03-03 LAB — POCT GLYCOSYLATED HEMOGLOBIN (HGB A1C): HEMOGLOBIN A1C: 7

## 2018-03-03 NOTE — Assessment & Plan Note (Addendum)
Acute.  Resolved.  Concerning for possible TIA.  No prior history of stroke/TIA.  Does have several comorbidities including recent CABG and diabetes.  Endorse some feelings of palpitations around the time of event however remains asymptomatic.  Complete neuro exam unremarkable.  EKG 12-lead NSR.  - Will check TSH level - Advised patient to call 911 with any neurological change in the future - Continue Crestor 40 mg daily and aspirin 325 mg daily - Considering switching to GLP agonist for more cardioprotective therapy for diabetes and next appointment in 1 month

## 2018-03-03 NOTE — Assessment & Plan Note (Addendum)
Chronic.  Likely secondary to stress.  Seems to primarily affect trapezius muscle sparing spine.   - Educated patient to use decreased stress techniques including deep breathing and to begin exercising more regularly - Advised patient to avoid NSAIDs given recent CABG

## 2018-03-03 NOTE — Assessment & Plan Note (Addendum)
Chronic.  Poorly controlled based on PHQ 9 score 21, extremely difficult and gad 7 score 20, extremely difficult though patient endorses improvement over the last few weeks.  Patient reluctant to receive pharmacotherapy or behavioral therapy at this time.  Patient is hopeful to be more active over the next few weeks given improvement following CABG several months ago.  No signs of SI/HI risk. - Advised patient to be more active consider alternative therapy in the future

## 2018-03-03 NOTE — Patient Instructions (Signed)
Thank you for coming in to see us today. Please see below to review our plan for today's visit.  1.  If your symptoms return with numbness, weakness, drooling, or slurring of speech, call 911 immediately. 2.  I will like to see you in 1 month to discuss your diabetes medications.  We may consider switching you to other diabetic medications which may be more cardioprotective. 3.  I encourage you to get more active.  A little can go a long way.  This will be a gradual process for you.  If you continue having depressive symptoms we can always discuss medications and/or meeting with a therapist.  Please call the clinic at [phone] if your symptoms worsen or you have any concerns. It was our pleasure to serve you.  Durward Parcelavid Kayn Haymore, DO [facility], PGY-2

## 2018-03-04 LAB — TSH: TSH: 1.96 u[IU]/mL (ref 0.450–4.500)

## 2018-03-05 ENCOUNTER — Encounter: Payer: Self-pay | Admitting: Family Medicine

## 2018-03-30 ENCOUNTER — Telehealth (HOSPITAL_COMMUNITY): Payer: Self-pay | Admitting: Pharmacist

## 2018-04-03 ENCOUNTER — Telehealth (HOSPITAL_COMMUNITY): Payer: Self-pay

## 2018-04-03 NOTE — Telephone Encounter (Signed)
Cardiac Rehab - Pharmacy Resident Documentation   Patient unable to be reached after three call attempts. Please complete allergy verification and medication review during patient's cardiac rehab appointment.    Roderic ScarceErin N. Zigmund Danieleja, PharmD PGY1 Pharmacy Resident Pager: [phone] 04/03/18  2:24 PM

## 2018-04-03 NOTE — Telephone Encounter (Signed)
Patients sister called on behalf of patient to reschedule orientation due to his ride being sick - Orientation is now on 05/11/18 at 1:30pm. Remailed packet to sisters address.

## 2018-04-04 ENCOUNTER — Ambulatory Visit (HOSPITAL_COMMUNITY): Payer: Medicaid Other

## 2018-04-10 ENCOUNTER — Ambulatory Visit (HOSPITAL_COMMUNITY): Payer: Medicaid Other

## 2018-04-12 ENCOUNTER — Ambulatory Visit (HOSPITAL_COMMUNITY): Payer: Medicaid Other

## 2018-04-14 ENCOUNTER — Ambulatory Visit (HOSPITAL_COMMUNITY): Payer: Medicaid Other

## 2018-04-17 ENCOUNTER — Ambulatory Visit (HOSPITAL_COMMUNITY): Payer: Medicaid Other

## 2018-04-19 ENCOUNTER — Ambulatory Visit (HOSPITAL_COMMUNITY): Payer: Medicaid Other

## 2018-04-21 ENCOUNTER — Ambulatory Visit (HOSPITAL_COMMUNITY): Payer: Medicaid Other

## 2018-04-24 ENCOUNTER — Ambulatory Visit (HOSPITAL_COMMUNITY): Payer: Medicaid Other

## 2018-04-26 ENCOUNTER — Ambulatory Visit (HOSPITAL_COMMUNITY): Payer: Medicaid Other

## 2018-04-28 ENCOUNTER — Ambulatory Visit (HOSPITAL_COMMUNITY): Payer: Medicaid Other

## 2018-05-01 ENCOUNTER — Ambulatory Visit (HOSPITAL_COMMUNITY): Payer: Medicaid Other

## 2018-05-03 ENCOUNTER — Ambulatory Visit (HOSPITAL_COMMUNITY): Payer: Medicaid Other

## 2018-05-03 ENCOUNTER — Telehealth (HOSPITAL_COMMUNITY): Payer: Self-pay | Admitting: Pharmacist

## 2018-05-05 ENCOUNTER — Ambulatory Visit (HOSPITAL_COMMUNITY): Payer: Medicaid Other

## 2018-05-09 NOTE — Telephone Encounter (Signed)
Cardiac Rehab - Pharmacy Resident Documentation   Patient unable to be reached after three call attempts. Please complete allergy verification and medication review during patient's cardiac rehab appointment.     Blake Divine, Pharm.D. PGY1 Pharmacy Resident 05/09/2018 4:00 PM Main Pharmacy: [phone]

## 2018-05-10 ENCOUNTER — Ambulatory Visit (HOSPITAL_COMMUNITY): Payer: Medicaid Other

## 2018-05-11 ENCOUNTER — Telehealth (HOSPITAL_COMMUNITY): Payer: Self-pay

## 2018-05-11 ENCOUNTER — Ambulatory Visit (HOSPITAL_COMMUNITY): Payer: Medicaid Other

## 2018-05-11 NOTE — Telephone Encounter (Signed)
Patient did not show up for Orientation that was scheduled today at 1:30pm. This was the 2nd reschedule. Closed referral.

## 2018-05-12 ENCOUNTER — Ambulatory Visit (HOSPITAL_COMMUNITY): Payer: Medicaid Other

## 2018-05-13 DIAGNOSIS — I639 Cerebral infarction, unspecified: Secondary | ICD-10-CM

## 2018-05-13 HISTORY — DX: Cerebral infarction, unspecified: I63.9

## 2018-05-15 ENCOUNTER — Other Ambulatory Visit: Payer: Self-pay

## 2018-05-15 ENCOUNTER — Ambulatory Visit (HOSPITAL_COMMUNITY): Payer: Medicaid Other

## 2018-05-15 DIAGNOSIS — Z794 Long term (current) use of insulin: Principal | ICD-10-CM

## 2018-05-15 DIAGNOSIS — E1142 Type 2 diabetes mellitus with diabetic polyneuropathy: Secondary | ICD-10-CM

## 2018-05-15 NOTE — Telephone Encounter (Signed)
Patient aunt, Drinda Buttsnnette, left voicemail for refill of insulin to [facility] [place].  Call back is [phone].  Ples SpecterAlisa Brake, RN [facility] RN)

## 2018-05-16 MED ORDER — INSULIN DETEMIR 100 UNIT/ML ~~LOC~~ SOLN: 20.0000 [IU] | Freq: Every day | SUBCUTANEOUS | 3 refills | Status: DC

## 2018-05-17 ENCOUNTER — Ambulatory Visit (HOSPITAL_COMMUNITY): Payer: Medicaid Other

## 2018-05-19 ENCOUNTER — Ambulatory Visit (HOSPITAL_COMMUNITY): Payer: Medicaid Other

## 2018-05-22 ENCOUNTER — Ambulatory Visit (HOSPITAL_COMMUNITY): Payer: Medicaid Other

## 2018-05-22 ENCOUNTER — Telehealth: Payer: Self-pay | Admitting: *Deleted

## 2018-05-22 NOTE — Telephone Encounter (Signed)
   [facility] Pre-operative Risk Assessment    Request for surgical clearance:  1. What type of surgery is being performed? LAP CHOLE W/IOC   2. When is this surgery scheduled? TBD   3. What type of clearance is required (medical clearance vs. Pharmacy clearance to hold med vs. Both)? BOTH  4. Are there any medications that need to be held prior to surgery and how long?ASPIRIN 325 MG-REQUESTING DIRECTIONS   5. Practice name and name of physician performing surgery? ERIC WILSON MD   6. What is your office phone number [phone]    7.   What is your office fax number [place]  8.   Anesthesia type (None, local, MAC, general) ? GENERAL   Travis [name] 05/22/2018, 4:42 PM  _________________________________________________________________   (provider comments below)

## 2018-05-23 NOTE — Progress Notes (Deleted)
   Subjective:    Patient ID: Travis [name], male    DOB: 04/11/1971, 47 y.o.   MRN: [id number]   CC:  HPI: COUGH  Has been coughing for *** days. Cough is: *** Sputum production: *** Medications tried: *** Taking blood pressure medications: *** Patient believes might be causing their pain: ***   Symptoms Runny nose: *** Mucous in back of throat: *** Throat burning or reflux: *** Wheezing or asthma: *** Fever: *** Chest Pain: *** Shortness of breath: *** Leg swelling: *** Hemoptysis: *** Weight loss: ***  ROS see HPI Smoking Status noted   Smoking status reviewed  Review of Systems   Objective:  There were no vitals taken for this visit. Vitals and nursing note reviewed  General: well nourished, in no acute distress HEENT: normocephalic, TM's visualized bilaterally, no scleral icterus or conjunctival pallor, no nasal discharge, moist mucous membranes, good dentition without erythema or discharge noted in posterior oropharynx Neck: supple, non-tender, without lymphadenopathy Cardiac: RRR, clear S1 and S2, no murmurs, rubs, or gallops Respiratory: clear to auscultation bilaterally, no increased work of breathing Abdomen: soft, nontender, nondistended, no masses or organomegaly. Bowel sounds present Extremities: no edema or cyanosis. Warm, well perfused. 2+ radial and PT pulses bilaterally Skin: warm and dry, no rashes noted Neuro: alert and oriented, no focal deficits   Assessment & Plan:    No problem-specific Assessment & Plan notes found for this encounter.    No follow-ups on file.   Oralia ManisSherin Delita Chiquito, DO, PGY-1

## 2018-05-24 ENCOUNTER — Ambulatory Visit (HOSPITAL_COMMUNITY): Payer: Medicaid Other

## 2018-05-24 ENCOUNTER — Ambulatory Visit: Payer: 59 | Admitting: Family Medicine

## 2018-05-25 NOTE — Telephone Encounter (Signed)
Follow up     Travis MusterAlicia from Dr. Andrey CampanileWilson office is calling to follow up about clearance. She wanted our office to be aware that Dr. Maren BeachVantrigt recommened for our office to do steromyoview. Travis Musterlicia states that pt may be up to date on all of that but wanted our office to aware of the recommendation so all offices are on the same page.

## 2018-05-25 NOTE — Telephone Encounter (Signed)
S/w Dr. Pauline GoodEric Wilson's office to clarify if pt needed stress myoview, pt does need this test.  Called pt lvm to call office for provider to ask questions regarding test and to schedule upcoming test needed for surgical clearance.   LVM for pt to call office.

## 2018-05-25 NOTE — Telephone Encounter (Signed)
   Primary Cardiologist: Chilton Siiffany [place], MD  Chart reviewed as part of pre-operative protocol coverage. Left voice mail to call between pre-op hours to go over cardiac symptoms.   Per Travis [name]'s note 01/23/18 "He was told no surgery x 1 year after CABG because of blood thinners". However Travis [name] from Dr. Tawana [name]'s office stated that Travis [name] recommended stress myoivew".  Covering staff, Please call Travis [name] office to clarify recommendations regarding Myoview and other if any.    ViccoBhavinkumar Ariea [name], [place] 05/25/2018, 1:31 PM

## 2018-05-26 ENCOUNTER — Ambulatory Visit (HOSPITAL_COMMUNITY): Payer: Medicaid Other

## 2018-05-26 NOTE — Telephone Encounter (Signed)
Agree, this is not necessary unless he has new chest pain.

## 2018-05-26 NOTE — Telephone Encounter (Signed)
   Primary Cardiologist:Tiffany Duke Salviaandolph, MD  Chart reviewed.  It is not clear why patient needs a stress test prior to gallbladder surgery.  He is s/p CABG in 11/2017.  Last office visit with Theodore Demarkhonda Barrett, PA-C in 01/2018.  Notes indicate he was not having chest pain at that time.  Will also route to Dr. Duke Salviaandolph to weigh in on decision to proceed with stress testing.  Tereso NewcomerScott Padraig Nhan, PA-C  05/26/2018, 11:58 AM

## 2018-05-29 ENCOUNTER — Ambulatory Visit (HOSPITAL_COMMUNITY): Payer: Medicaid Other

## 2018-05-29 NOTE — Telephone Encounter (Signed)
Dr. Duke Salviaandolph can pt be off ASA for procedure?  He is 6 months post CABG.

## 2018-05-31 ENCOUNTER — Ambulatory Visit (HOSPITAL_COMMUNITY): Payer: Medicaid Other

## 2018-05-31 NOTE — Telephone Encounter (Signed)
Ideally hold for 3 days, not 5.  If it has to be 5 then OK.

## 2018-06-01 NOTE — Telephone Encounter (Signed)
Left VM for pt to call back to discuss any new symptoms.

## 2018-06-02 ENCOUNTER — Ambulatory Visit (HOSPITAL_COMMUNITY): Payer: Medicaid Other

## 2018-06-05 ENCOUNTER — Ambulatory Visit (HOSPITAL_COMMUNITY): Payer: Medicaid Other

## 2018-06-07 ENCOUNTER — Ambulatory Visit (HOSPITAL_COMMUNITY): Payer: Medicaid Other

## 2018-06-07 NOTE — Telephone Encounter (Signed)
   Primary Cardiologist: Chilton Siiffany Quincy, MD  Chart reviewed as part of pre-operative protocol coverage. Patient was contacted 06/07/2018 in reference to pre-operative risk assessment for pending surgery as outlined below.  Travis [name] was last seen 01/2018 by R. Barrett PA-C. H/o CABG 11/29/2017 w/ LIMA-LAD, SVG-PDA, SVG-CFX. Hx IDDM, HTN, HLD, combined CHF, GERD, gastroparesis, LV aneurysm.   Attempted to call patient again, no answer, LMOM to call back.  Callback staff, please let surgical team know (who called for update) know that we have been unable to reach patient to clear him yet and if they get in touch with him to please have him call our office.  Laurann Montanaayna N Lenoard Helbert, PA-C 06/07/2018, 2:08 PM

## 2018-06-07 NOTE — Telephone Encounter (Signed)
New Message:       Helmut Musterlicia from [facility] is calling to check on the status of the clearance.

## 2018-06-07 NOTE — Telephone Encounter (Signed)
Left a message for Ethlyn GalleryAlisha Spillers, CMA, to let her know that we have tried to reach pt re: surgical clearance, and if she speaks with the pt, have him contact our office.

## 2018-06-09 ENCOUNTER — Ambulatory Visit (HOSPITAL_COMMUNITY): Payer: Medicaid Other

## 2018-06-09 NOTE — Telephone Encounter (Signed)
   6/28, left another message for the patient.  Theodore Demarkhonda Barrett, PA-C 06/09/2018 8:09 AM Beeper [phone]

## 2018-06-11 ENCOUNTER — Other Ambulatory Visit: Payer: Self-pay | Admitting: Family Medicine

## 2018-06-11 DIAGNOSIS — E1142 Type 2 diabetes mellitus with diabetic polyneuropathy: Secondary | ICD-10-CM

## 2018-06-12 ENCOUNTER — Ambulatory Visit (HOSPITAL_COMMUNITY): Payer: Medicaid Other

## 2018-06-12 ENCOUNTER — Other Ambulatory Visit: Payer: Self-pay | Admitting: Cardiology

## 2018-06-12 DIAGNOSIS — Z9889 Other specified postprocedural states: Secondary | ICD-10-CM

## 2018-06-12 DIAGNOSIS — Z01810 Encounter for preprocedural cardiovascular examination: Secondary | ICD-10-CM

## 2018-06-12 HISTORY — DX: Other specified postprocedural states: Z98.890

## 2018-06-12 NOTE — Telephone Encounter (Signed)
   Primary Cardiologist: Chilton Siiffany [place], MD  Chart reviewed as part of pre-operative protocol coverage. Patient was contacted 06/12/2018 in reference to pre-operative risk assessment for pending surgery as outlined below.  Katha CabalMichael T Corsello was last seen on 01/23/18 by Theodore Demarkhonda Barrett, PA.  [name] underwent CABG on 11/29/17 and has IDDM, HTN, HLD and CHF. Since February, [name] has done fair.  He is not active due to getting blisters with any level of walking and has already lost a few toes. He is not having chest pain or shortness of breath but is still very fatigued and legs get very tired with minimal exertion. He says that he had no chest pain prior to his CABG, only fatigue and he does not feel any better after the surgery.   Due to continued symptoms, [name] will require a Steffanie DunnLexiscan myoview for further pre-operative risk assessment.  Pre-op covering staff: - Please schedule a lexiscan myoview for fatigue with recent CABG.  - Please contact requesting surgeon's office via preferred method (i.e, phone, fax) to inform them of need for stress test prior to surgery.  If stress test is low risk we can provide clearance. If high risk he will need office visit.   Berton BonJanine Cheyann Blecha, NP 06/12/2018, 3:13 PM

## 2018-06-12 NOTE — Telephone Encounter (Signed)
Left message for pt to call back. Pt is schedule to have a Leiscan myoview on 06/23/18 I have notified requesting office of pt's appointment

## 2018-06-13 NOTE — Telephone Encounter (Signed)
Attempted to reach pt for the 2nd time and there was no answer.

## 2018-06-14 ENCOUNTER — Ambulatory Visit (HOSPITAL_COMMUNITY): Payer: Medicaid Other

## 2018-06-14 NOTE — Telephone Encounter (Signed)
Spoke with pt's sister per dpr. I notified her of pt's appointment to have stress test. I went over instructions with her and I have also mailed out a copy of the instructions. She verbalized understanding.

## 2018-06-16 ENCOUNTER — Ambulatory Visit (HOSPITAL_COMMUNITY): Payer: Medicaid Other

## 2018-06-19 ENCOUNTER — Ambulatory Visit (HOSPITAL_COMMUNITY): Payer: Medicaid Other

## 2018-06-20 NOTE — Progress Notes (Deleted)
   Subjective   Patient ID: Travis [name]    DOB: 08/02/1971, 47 y.o. male   MRN: [id number]  CC: "***"  HPI: Travis [name] is a 47 y.o. male who presents to clinic today for the following:  ***: ***  ***Seen by me on 02/2018 for left arm numbness and palpitations.  TSH WNL.  Consider switching to a GLP agonist for more cardioprotective therapy for diabetes.  Also complaining of strain of right muscle.  A1c due.  Last A1c 7.0.  Needs pneumococcal vaccine and foot exam.  Ask about ophthalmology exam.  ROS: see HPI for pertinent.  PMFSH: CAD (s/p CABG 11/2018, on ASA) and HFrEF (26%, 07/2016), DM with gastroparesis, neuropathy, ED, and h/o DKA, h/o osteomyelitis of L-foot, OA with vit-D deficiency. 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 Family history COPD, heart failure, esophogeal cancer (fatehr), fibromyalgia, DM. Smoking status reviewed. Medications reviewed.  Objective   There were no vitals taken for this visit. Vitals and nursing note reviewed.  General: well nourished, well developed, NAD with non-toxic appearance HEENT: normocephalic, atraumatic, moist mucous membranes Neck: supple, non-tender without lymphadenopathy Cardiovascular: regular rate and rhythm without murmurs, rubs, or gallops Lungs: clear to auscultation bilaterally with normal work of breathing Abdomen: soft, non-tender, non-distended, normoactive bowel sounds Skin: warm, dry, no rashes or lesions, cap refill < 2 seconds Extremities: warm and well perfused, normal tone, no edema  Assessment & Plan   No problem-specific Assessment & Plan notes found for this encounter.  No orders of the defined types were placed in this encounter.  No orders of the defined types were placed in this encounter.   Durward Parcelavid Quavion Boule, DO [facility], PGY-3 06/20/2018, 9:43 AM

## 2018-06-21 ENCOUNTER — Telehealth (HOSPITAL_COMMUNITY): Payer: Self-pay | Admitting: *Deleted

## 2018-06-21 ENCOUNTER — Ambulatory Visit (HOSPITAL_COMMUNITY): Payer: Medicaid Other

## 2018-06-21 ENCOUNTER — Ambulatory Visit: Payer: Medicare Other | Admitting: Family Medicine

## 2018-06-21 NOTE — Telephone Encounter (Signed)
Left message on voicemail per DPR in reference to upcoming appointment scheduled on 06/23/18 with detailed instructions given per Myocardial Perfusion Study Information Sheet for the test. LM to arrive 15 minutes early, and that it is imperative to arrive on time for appointment to keep from having the test rescheduled. If you need to cancel or reschedule your appointment, please call the office within 24 hours of your appointment. Failure to do so may result in a cancellation of your appointment, and a $50 no show fee. Phone number given for call back for any questions. Beryl Balz Jacqueline   

## 2018-06-23 ENCOUNTER — Ambulatory Visit (HOSPITAL_COMMUNITY): Payer: Medicaid Other

## 2018-06-23 ENCOUNTER — Ambulatory Visit (HOSPITAL_BASED_OUTPATIENT_CLINIC_OR_DEPARTMENT_OTHER): Payer: Medicare Other

## 2018-06-23 DIAGNOSIS — Z01810 Encounter for preprocedural cardiovascular examination: Secondary | ICD-10-CM

## 2018-06-23 DIAGNOSIS — I509 Heart failure, unspecified: Secondary | ICD-10-CM | POA: Insufficient documentation

## 2018-06-23 DIAGNOSIS — Z951 Presence of aortocoronary bypass graft: Secondary | ICD-10-CM | POA: Insufficient documentation

## 2018-06-23 DIAGNOSIS — I110 Hypertensive heart disease with heart failure: Secondary | ICD-10-CM | POA: Insufficient documentation

## 2018-06-23 DIAGNOSIS — I2510 Atherosclerotic heart disease of native coronary artery without angina pectoris: Secondary | ICD-10-CM

## 2018-06-23 DIAGNOSIS — R404 Transient alteration of awareness: Secondary | ICD-10-CM | POA: Diagnosis not present

## 2018-06-23 DIAGNOSIS — R5383 Other fatigue: Secondary | ICD-10-CM

## 2018-06-23 DIAGNOSIS — R9439 Abnormal result of other cardiovascular function study: Secondary | ICD-10-CM | POA: Insufficient documentation

## 2018-06-23 DIAGNOSIS — E119 Type 2 diabetes mellitus without complications: Secondary | ICD-10-CM | POA: Insufficient documentation

## 2018-06-23 DIAGNOSIS — Z01818 Encounter for other preprocedural examination: Secondary | ICD-10-CM

## 2018-06-23 DIAGNOSIS — I63439 Cerebral infarction due to embolism of unspecified posterior cerebral artery: Secondary | ICD-10-CM | POA: Diagnosis not present

## 2018-06-23 LAB — MYOCARDIAL PERFUSION IMAGING
CHL CUP NUCLEAR SDS: 2
CHL CUP RESTING HR STRESS: 72 {beats}/min
CSEPPHR: 104 {beats}/min
LVDIAVOL: 184 mL (ref 62–150)
LVSYSVOL: 104 mL
RATE: 0.44
SRS: 21
SSS: 23
TID: 1.03

## 2018-06-23 MED ORDER — TECHNETIUM TC 99M TETROFOSMIN IV KIT
10.5000 | PACK | Freq: Once | INTRAVENOUS | Status: AC | PRN
  Administered 2018-06-23: 10.5 via INTRAVENOUS
  Filled 2018-06-23: qty 11

## 2018-06-23 MED ORDER — TECHNETIUM TC 99M TETROFOSMIN IV KIT
32.7000 | PACK | Freq: Once | INTRAVENOUS | Status: AC | PRN
  Administered 2018-06-23: 32.7 via INTRAVENOUS
  Filled 2018-06-23: qty 33

## 2018-06-23 MED ORDER — REGADENOSON 0.4 MG/5ML IV SOLN
0.4000 mg | Freq: Once | INTRAVENOUS | Status: AC
  Administered 2018-06-23: 0.4 mg via INTRAVENOUS

## 2018-06-25 ENCOUNTER — Emergency Department (HOSPITAL_COMMUNITY): Payer: Medicare Other

## 2018-06-25 ENCOUNTER — Other Ambulatory Visit: Payer: Self-pay

## 2018-06-25 ENCOUNTER — Encounter (HOSPITAL_COMMUNITY): Payer: Self-pay

## 2018-06-25 ENCOUNTER — Inpatient Hospital Stay (HOSPITAL_COMMUNITY)
Admission: EM | Admit: 2018-06-25 | Discharge: 2018-06-27 | DRG: 062 | Disposition: A | Discharge status: Home / Self Care | Payer: Medicare Other | Attending: Neurology | Admitting: Neurology

## 2018-06-25 ENCOUNTER — Inpatient Hospital Stay (HOSPITAL_COMMUNITY): Payer: Medicare Other

## 2018-06-25 DIAGNOSIS — Z794 Long term (current) use of insulin: Secondary | ICD-10-CM

## 2018-06-25 DIAGNOSIS — Z951 Presence of aortocoronary bypass graft: Secondary | ICD-10-CM

## 2018-06-25 DIAGNOSIS — F329 Major depressive disorder, single episode, unspecified: Secondary | ICD-10-CM | POA: Diagnosis present

## 2018-06-25 DIAGNOSIS — I255 Ischemic cardiomyopathy: Secondary | ICD-10-CM | POA: Diagnosis present

## 2018-06-25 DIAGNOSIS — Z8673 Personal history of transient ischemic attack (TIA), and cerebral infarction without residual deficits: Secondary | ICD-10-CM | POA: Diagnosis present

## 2018-06-25 DIAGNOSIS — Z89512 Acquired absence of left leg below knee: Secondary | ICD-10-CM

## 2018-06-25 DIAGNOSIS — I5042 Chronic combined systolic (congestive) and diastolic (congestive) heart failure: Secondary | ICD-10-CM | POA: Diagnosis present

## 2018-06-25 DIAGNOSIS — E1143 Type 2 diabetes mellitus with diabetic autonomic (poly)neuropathy: Secondary | ICD-10-CM | POA: Diagnosis present

## 2018-06-25 DIAGNOSIS — D72829 Elevated white blood cell count, unspecified: Secondary | ICD-10-CM | POA: Diagnosis not present

## 2018-06-25 DIAGNOSIS — M810 Age-related osteoporosis without current pathological fracture: Secondary | ICD-10-CM | POA: Diagnosis present

## 2018-06-25 DIAGNOSIS — I2510 Atherosclerotic heart disease of native coronary artery without angina pectoris: Secondary | ICD-10-CM | POA: Diagnosis present

## 2018-06-25 DIAGNOSIS — R471 Dysarthria and anarthria: Secondary | ICD-10-CM | POA: Diagnosis present

## 2018-06-25 DIAGNOSIS — F419 Anxiety disorder, unspecified: Secondary | ICD-10-CM | POA: Diagnosis present

## 2018-06-25 DIAGNOSIS — Z79899 Other long term (current) drug therapy: Secondary | ICD-10-CM | POA: Diagnosis not present

## 2018-06-25 DIAGNOSIS — I639 Cerebral infarction, unspecified: Secondary | ICD-10-CM

## 2018-06-25 DIAGNOSIS — F330 Major depressive disorder, recurrent, mild: Secondary | ICD-10-CM | POA: Diagnosis present

## 2018-06-25 DIAGNOSIS — I5043 Acute on chronic combined systolic (congestive) and diastolic (congestive) heart failure: Secondary | ICD-10-CM | POA: Diagnosis present

## 2018-06-25 DIAGNOSIS — Z8249 Family history of ischemic heart disease and other diseases of the circulatory system: Secondary | ICD-10-CM | POA: Diagnosis not present

## 2018-06-25 DIAGNOSIS — R404 Transient alteration of awareness: Secondary | ICD-10-CM | POA: Diagnosis present

## 2018-06-25 DIAGNOSIS — E118 Type 2 diabetes mellitus with unspecified complications: Secondary | ICD-10-CM | POA: Diagnosis not present

## 2018-06-25 DIAGNOSIS — E11319 Type 2 diabetes mellitus with unspecified diabetic retinopathy without macular edema: Secondary | ICD-10-CM | POA: Diagnosis present

## 2018-06-25 DIAGNOSIS — K3184 Gastroparesis: Secondary | ICD-10-CM

## 2018-06-25 DIAGNOSIS — I63439 Cerebral infarction due to embolism of unspecified posterior cerebral artery: Principal | ICD-10-CM | POA: Diagnosis present

## 2018-06-25 DIAGNOSIS — R402242 Coma scale, best verbal response, confused conversation, at arrival to emergency department: Secondary | ICD-10-CM | POA: Diagnosis present

## 2018-06-25 DIAGNOSIS — E785 Hyperlipidemia, unspecified: Secondary | ICD-10-CM | POA: Diagnosis present

## 2018-06-25 DIAGNOSIS — Z7982 Long term (current) use of aspirin: Secondary | ICD-10-CM

## 2018-06-25 DIAGNOSIS — E1159 Type 2 diabetes mellitus with other circulatory complications: Secondary | ICD-10-CM

## 2018-06-25 DIAGNOSIS — I10 Essential (primary) hypertension: Secondary | ICD-10-CM

## 2018-06-25 DIAGNOSIS — Z818 Family history of other mental and behavioral disorders: Secondary | ICD-10-CM | POA: Diagnosis not present

## 2018-06-25 DIAGNOSIS — R402362 Coma scale, best motor response, obeys commands, at arrival to emergency department: Secondary | ICD-10-CM | POA: Diagnosis present

## 2018-06-25 DIAGNOSIS — R29707 NIHSS score 7: Secondary | ICD-10-CM | POA: Diagnosis present

## 2018-06-25 DIAGNOSIS — Z89432 Acquired absence of left foot: Secondary | ICD-10-CM

## 2018-06-25 DIAGNOSIS — I739 Peripheral vascular disease, unspecified: Secondary | ICD-10-CM

## 2018-06-25 DIAGNOSIS — E119 Type 2 diabetes mellitus without complications: Secondary | ICD-10-CM

## 2018-06-25 DIAGNOSIS — K219 Gastro-esophageal reflux disease without esophagitis: Secondary | ICD-10-CM | POA: Diagnosis present

## 2018-06-25 DIAGNOSIS — G459 Transient cerebral ischemic attack, unspecified: Secondary | ICD-10-CM

## 2018-06-25 DIAGNOSIS — M7989 Other specified soft tissue disorders: Secondary | ICD-10-CM | POA: Diagnosis not present

## 2018-06-25 DIAGNOSIS — R402132 Coma scale, eyes open, to sound, at arrival to emergency department: Secondary | ICD-10-CM | POA: Diagnosis present

## 2018-06-25 DIAGNOSIS — R4781 Slurred speech: Secondary | ICD-10-CM

## 2018-06-25 DIAGNOSIS — I25810 Atherosclerosis of coronary artery bypass graft(s) without angina pectoris: Secondary | ICD-10-CM | POA: Diagnosis not present

## 2018-06-25 DIAGNOSIS — I110 Hypertensive heart disease with heart failure: Secondary | ICD-10-CM | POA: Diagnosis present

## 2018-06-25 DIAGNOSIS — E1142 Type 2 diabetes mellitus with diabetic polyneuropathy: Secondary | ICD-10-CM | POA: Diagnosis present

## 2018-06-25 LAB — COMPREHENSIVE METABOLIC PANEL
ALBUMIN: 4 g/dL (ref 3.5–5.0)
ALK PHOS: 64 U/L (ref 38–126)
ALT: 16 U/L (ref 0–44)
AST: 22 U/L (ref 15–41)
Anion gap: 11 (ref 5–15)
BILIRUBIN TOTAL: 1 mg/dL (ref 0.3–1.2)
BUN: 13 mg/dL (ref 6–20)
CALCIUM: 9.5 mg/dL (ref 8.9–10.3)
CO2: 24 mmol/L (ref 22–32)
Chloride: 105 mmol/L (ref 98–111)
Creatinine, Ser: 1.08 mg/dL (ref 0.61–1.24)
GFR calc Af Amer: >60 mL/min (ref >60)
GFR calc non Af Amer: >60 mL/min (ref >60)
GLUCOSE: 141 mg/dL — AB (ref 70–99)
Potassium: 4.2 mmol/L (ref 3.5–5.1)
Sodium: 140 mmol/L (ref 135–145)
TOTAL PROTEIN: 7.4 g/dL (ref 6.5–8.1)

## 2018-06-25 LAB — URINALYSIS, ROUTINE W REFLEX MICROSCOPIC
BACTERIA UA: NONE SEEN
Bilirubin Urine: NEGATIVE
Glucose, UA: 150 mg/dL — AB
Ketones, ur: 20 mg/dL — AB
LEUKOCYTES UA: NEGATIVE
NITRITE: NEGATIVE
Protein, ur: 30 mg/dL — AB
Specific Gravity, Urine: 1.031 — ABNORMAL HIGH (ref 1.005–1.030)
pH: 5 (ref 5.0–8.0)

## 2018-06-25 LAB — DIFFERENTIAL
ABS IMMATURE GRANULOCYTES: 0 10*3/uL (ref 0.0–0.1)
Basophils Absolute: 0.1 10*3/uL (ref 0.0–0.1)
Basophils Relative: 1 %
Eosinophils Absolute: 0.3 10*3/uL (ref 0.0–0.7)
Eosinophils Relative: 3 %
IMMATURE GRANULOCYTES: 0 %
LYMPHS ABS: 2.9 10*3/uL (ref 0.7–4.0)
LYMPHS PCT: 25 %
Monocytes Absolute: 0.7 10*3/uL (ref 0.1–1.0)
Monocytes Relative: 6 %
NEUTROS ABS: 7.6 10*3/uL (ref 1.7–7.7)
Neutrophils Relative %: 65 %

## 2018-06-25 LAB — ECHOCARDIOGRAM COMPLETE
HEIGHTINCHES: 74 in
WEIGHTICAEL: 3516.7779 [oz_av]

## 2018-06-25 LAB — GLUCOSE, CAPILLARY
GLUCOSE-CAPILLARY: 156 mg/dL — AB (ref 70–99)
Glucose-Capillary: 164 mg/dL — ABNORMAL HIGH (ref 70–99)
Glucose-Capillary: 183 mg/dL — ABNORMAL HIGH (ref 70–99)
Glucose-Capillary: 178 mg/dL — ABNORMAL HIGH (ref 70–99)

## 2018-06-25 LAB — CBG MONITORING, ED
GLUCOSE-CAPILLARY: 139 mg/dL — AB (ref 70–99)
Glucose-Capillary: 130 mg/dL — ABNORMAL HIGH (ref 70–99)

## 2018-06-25 LAB — ETHANOL

## 2018-06-25 LAB — I-STAT CHEM 8, ED
BUN: 15 mg/dL (ref 6–20)
CALCIUM ION: 1.1 mmol/L — AB (ref 1.15–1.40)
CHLORIDE: 104 mmol/L (ref 98–111)
Creatinine, Ser: 1 mg/dL (ref 0.61–1.24)
GLUCOSE: 141 mg/dL — AB (ref 70–99)
HCT: 41 % (ref 39.0–52.0)
Hemoglobin: 13.9 g/dL (ref 13.0–17.0)
Potassium: 4.1 mmol/L (ref 3.5–5.1)
Sodium: 139 mmol/L (ref 135–145)
TCO2: 25 mmol/L (ref 22–32)

## 2018-06-25 LAB — RAPID URINE DRUG SCREEN, HOSP PERFORMED
Amphetamines: NOT DETECTED
Benzodiazepines: NOT DETECTED
COCAINE: NOT DETECTED
Opiates: NOT DETECTED
TETRAHYDROCANNABINOL: POSITIVE — AB

## 2018-06-25 LAB — I-STAT TROPONIN, ED: Troponin i, poc: 0 ng/mL (ref 0.00–0.08)

## 2018-06-25 LAB — PROTIME-INR
INR: 1.12
Prothrombin Time: 14.3 s (ref 11.4–15.2)

## 2018-06-25 LAB — APTT: aPTT: 30 s (ref 24–36)

## 2018-06-25 LAB — CBC
HCT: 42.2 % (ref 39.0–52.0)
Hemoglobin: 13.9 g/dL (ref 13.0–17.0)
MCH: 29.4 pg (ref 26.0–34.0)
MCHC: 32.9 g/dL (ref 30.0–36.0)
MCV: 89.4 fL (ref 78.0–100.0)
Platelets: 249 K/uL (ref 150–400)
RBC: 4.72 MIL/uL (ref 4.22–5.81)
RDW: 13.9 % (ref 11.5–15.5)
WBC: 11.7 K/uL — ABNORMAL HIGH (ref 4.0–10.5)

## 2018-06-25 LAB — MRSA PCR SCREENING: MRSA BY PCR: NEGATIVE

## 2018-06-25 MED ORDER — ROSUVASTATIN CALCIUM 20 MG PO TABS
40.0000 mg | ORAL_TABLET | Freq: Every day | ORAL | Status: DC
  Administered 2018-06-25 – 2018-06-26 (×2): 40 mg via ORAL
  Filled 2018-06-25 (×2): qty 2

## 2018-06-25 MED ORDER — SODIUM CHLORIDE 0.9 % IV SOLN: 50.0000 mL | Freq: Once | INTRAVENOUS | Status: DC

## 2018-06-25 MED ORDER — PANTOPRAZOLE SODIUM 40 MG PO TBEC
40.0000 mg | DELAYED_RELEASE_TABLET | Freq: Every day | ORAL | Status: DC
  Administered 2018-06-25 – 2018-06-27 (×3): 40 mg via ORAL
  Filled 2018-06-25 (×3): qty 1

## 2018-06-25 MED ORDER — ONDANSETRON HCL 4 MG/2ML IJ SOLN
4.0000 mg | Freq: Four times a day (QID) | INTRAMUSCULAR | Status: DC | PRN
  Administered 2018-06-25: 4 mg via INTRAVENOUS
  Filled 2018-06-25: qty 2

## 2018-06-25 MED ORDER — IOPAMIDOL (ISOVUE-370) INJECTION 76%
INTRAVENOUS | Status: AC
  Filled 2018-06-25: qty 50

## 2018-06-25 MED ORDER — STROKE: EARLY STAGES OF RECOVERY BOOK
Freq: Once | Status: DC
  Filled 2018-06-25: qty 1

## 2018-06-25 MED ORDER — SENNOSIDES-DOCUSATE SODIUM 8.6-50 MG PO TABS: 1.0000 | ORAL_TABLET | Freq: Every evening | ORAL | Status: DC | PRN

## 2018-06-25 MED ORDER — PERFLUTREN LIPID MICROSPHERE
1.0000 mL | INTRAVENOUS | Status: AC | PRN
  Administered 2018-06-25: 4 mL via INTRAVENOUS
  Filled 2018-06-25: qty 10

## 2018-06-25 MED ORDER — METOPROLOL SUCCINATE ER 25 MG PO TB24
12.5000 mg | ORAL_TABLET | Freq: Every day | ORAL | Status: DC
  Administered 2018-06-25 – 2018-06-27 (×3): 12.5 mg via ORAL
  Filled 2018-06-25 (×3): qty 1

## 2018-06-25 MED ORDER — INSULIN DETEMIR 100 UNIT/ML ~~LOC~~ SOLN
20.0000 [IU] | Freq: Every day | SUBCUTANEOUS | Status: DC
  Administered 2018-06-25: 20 [IU] via SUBCUTANEOUS
  Filled 2018-06-25 (×2): qty 0.2

## 2018-06-25 MED ORDER — ALTEPLASE (STROKE) FULL DOSE INFUSION
90.0000 mg | Freq: Once | INTRAVENOUS | Status: AC
  Administered 2018-06-25: 90 mg via INTRAVENOUS
  Filled 2018-06-25: qty 90

## 2018-06-25 MED ORDER — ACETAMINOPHEN 160 MG/5ML PO SOLN: 650.0000 mg | ORAL | Status: DC | PRN

## 2018-06-25 MED ORDER — ACETAMINOPHEN 325 MG PO TABS: 650.0000 mg | ORAL_TABLET | ORAL | Status: DC | PRN

## 2018-06-25 MED ORDER — SODIUM CHLORIDE 0.9 % IV SOLN
INTRAVENOUS | Status: DC
  Administered 2018-06-25: 09:00:00 via INTRAVENOUS

## 2018-06-25 MED ORDER — PERFLUTREN LIPID MICROSPHERE
INTRAVENOUS | Status: AC
  Administered 2018-06-25: 2 mL
  Filled 2018-06-25: qty 10

## 2018-06-25 MED ORDER — GABAPENTIN 400 MG PO CAPS: 800.0000 mg | ORAL_CAPSULE | Freq: Three times a day (TID) | ORAL | Status: DC

## 2018-06-25 MED ORDER — IOPAMIDOL (ISOVUE-370) INJECTION 76%
50.0000 mL | Freq: Once | INTRAVENOUS | Status: AC | PRN
  Administered 2018-06-25: 50 mL via INTRAVENOUS

## 2018-06-25 MED ORDER — GABAPENTIN 800 MG PO TABS
800.0000 mg | ORAL_TABLET | Freq: Three times a day (TID) | ORAL | Status: DC
  Filled 2018-06-25 (×2): qty 1

## 2018-06-25 MED ORDER — ACETAMINOPHEN 650 MG RE SUPP: 650.0000 mg | RECTAL | Status: DC | PRN

## 2018-06-25 MED ORDER — ONDANSETRON HCL 4 MG/2ML IJ SOLN
4.0000 mg | Freq: Once | INTRAMUSCULAR | Status: AC
  Administered 2018-06-25: 4 mg via INTRAVENOUS
  Filled 2018-06-25: qty 2

## 2018-06-25 MED ORDER — GABAPENTIN 400 MG PO CAPS
2400.0000 mg | ORAL_CAPSULE | Freq: Every day | ORAL | Status: DC
  Administered 2018-06-25 – 2018-06-26 (×2): 2400 mg via ORAL
  Filled 2018-06-25 (×2): qty 6

## 2018-06-25 MED ORDER — INSULIN ASPART 100 UNIT/ML ~~LOC~~ SOLN
0.0000 [IU] | Freq: Three times a day (TID) | SUBCUTANEOUS | Status: DC
  Administered 2018-06-25 (×3): 2 [IU] via SUBCUTANEOUS
  Administered 2018-06-26: 1 [IU] via SUBCUTANEOUS
  Administered 2018-06-26: 2 [IU] via SUBCUTANEOUS
  Administered 2018-06-26: 3 [IU] via SUBCUTANEOUS
  Administered 2018-06-27: 1 [IU] via SUBCUTANEOUS
  Administered 2018-06-27: 2 [IU] via SUBCUTANEOUS

## 2018-06-25 MED ORDER — METOCLOPRAMIDE HCL 10 MG PO TABS
10.0000 mg | ORAL_TABLET | Freq: Three times a day (TID) | ORAL | Status: DC
  Administered 2018-06-25 – 2018-06-27 (×8): 10 mg via ORAL
  Filled 2018-06-25 (×2): qty 2
  Filled 2018-06-25: qty 1
  Filled 2018-06-25: qty 2
  Filled 2018-06-25: qty 1
  Filled 2018-06-25 (×2): qty 2
  Filled 2018-06-25: qty 1
  Filled 2018-06-25: qty 2

## 2018-06-25 NOTE — ED Notes (Signed)
Report to Tammy RN.

## 2018-06-25 NOTE — Progress Notes (Signed)
PT Cancellation Note  Patient Details Name: Travis CabalMichael T Colt MRN: [id number] DOB: 01/04/1971   Cancelled Treatment:    Reason Eval/Treat Not Completed: Active bedrest order   Will continue to check back for PT eval as schedule allows;   Van ClinesHolly Ashleah Valtierra, [place]PT  Acute Rehabilitation Services Pager [phone] Office [phone]    Levi AlandHolly H Findley Blankenbaker 06/25/2018, 7:28 AM

## 2018-06-25 NOTE — ED Notes (Signed)
MD Aroor at bedside, at this time deciding to activate code stoke. MD Horton aware

## 2018-06-25 NOTE — ED Provider Notes (Addendum)
[facility] EMERGENCY DEPARTMENT Provider Note   CSN: [id number] Arrival date & time: 06/25/18  0345     History   Chief Complaint Chief Complaint  Patient presents with  . Altered Mental Status    HPI Travis [name] is a 47 y.o. male.  HPI  This is a 47 year old male with history of coronary artery disease, heart failure, diabetes, gastroparesis, hypertension who presents with altered mental status.  EMS was called as patient thought he was having a stroke.  EMS reports finding patient in bed after vomiting.  He reported a headache and left-sided weakness.  Patient reports onset of symptoms around 2 AM.  EMS reports that after initial evaluation, patient went "unresponsive."  Upon their initial stroke assessment, EMS did not note any focal deficits; however, fire reported some left-sided weakness and slurred speech.  Patient reports the same.  He states that that seems somewhat improved.  Denies any chest pain or shortness of breath.  Denies any headache at this time.  Reports that he generally feels weak all over.  Denies any recent illnesses.  Past Medical History:  Diagnosis Date  . Acute on chronic systolic and diastolic heart failure, NYHA class 3 (HCC) 09/14/2017  . Acute osteomyelitis of metatarsal bone of left foot (HCC) 11/12/2015  . CAD in native artery 09/14/2017  . Closed fracture of right tibial plateau 11/11/2015  . Depression with anxiety   . Diabetes mellitus    INSULIN DEPENDENT Type II  . Diabetic peripheral neuropathy associated with type 2 diabetes mellitus (HCC) 08/30/2008   Qualifier: Diagnosis of  By: Daphine Deutscher FNP, Zena Amos    . Diabetic ulcer of left foot (HCC) 11/14/2015  . Gastroparesis   . GERD (gastroesophageal reflux disease)   . Hypertension   . Left ventricular aneurysm   . Osteomyelitis (HCC)   . Osteoporosis 11/12/2015  . Peripheral neuropathy   . Shortness of breath dyspnea    with exertion  . Vitamin D deficiency 11/12/2015     Patient Active Problem List   Diagnosis Date Noted  . Left arm numbness 03/03/2018  . Strain of right trapezius muscle 03/03/2018  . Ischemic cardiomyopathy 12/16/2017  . Anxiety with depression 09/16/2017  . Acute on chronic systolic and diastolic heart failure, NYHA class 3 (HCC) 09/14/2017  . CAD in native artery 09/14/2017  . 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 Short Achilles tendon (acquired), left ankle 11/19/2016  . Gastroparesis due to DM (HCC)   . History of diabetic ulcer of foot 11/14/2015  . Vitamin D deficiency 11/12/2015  . Osteoporosis 11/12/2015  . GERD (gastroesophageal reflux disease) 11/03/2015  . Erectile dysfunction 07/11/2009  . Allergic rhinitis 12/11/2008  . 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 Insulin dependent diabetes mellitus with complications (HCC) 08/09/2007    Past Surgical History:  Procedure Laterality Date  . AMPUTATION Left 11/28/2015   Procedure: Left 4th Ray Amputation;  Surgeon: Nadara Mustard, MD;  Location: [facility] OR;  Service: Orthopedics;  Laterality: Left;  . AMPUTATION Left 10/29/2016   Procedure: LEFT 5TH RAY AMPUTATION;  Surgeon: Nadara Mustard, MD;  Location: [facility] OR;  Service: Orthopedics;  Laterality: Left;  . ANTERIOR VITRECTOMY Left 04/12/2016   Procedure: ANTERIOR CHAMBER WASH OUT WITH GAS FLUID EXCHANGE;  Surgeon: Carmela Rima, MD;  Location: [facility] OR;  Service: Ophthalmology;  Laterality: Left;  . CARDIAC CATHETERIZATION    . CATARACT EXTRACTION Left   . CORONARY ARTERY  BYPASS GRAFT N/A 11/29/2017    LIMA-LAD, SVG-PDA, SVG-CFX  Procedure: CORONARY ARTERY BYPASS GRAFTING (CABG) times three using the left saphaneous vien. harvested endoscopicly and left internal mammary artery.;  Surgeon: Kerin Perna, MD;  Location: [facility] OR;  Service: Open Heart Surgery;  Laterality: N/A;  . EYE SURGERY     left eye surgery 04/2016  . KNEE SURGERY Left   . RIGHT/LEFT HEART CATH AND  CORONARY ANGIOGRAPHY N/A 10/19/2017   Procedure: RIGHT/LEFT HEART CATH AND CORONARY ANGIOGRAPHY;  Surgeon: Swazil[place], [name], MD;  Location[facility] INVASIVE CV LAB;  Service: Cardiovascular;  Laterality: N/A;  . SHOULDER SURGERY Right   . TEE WITHOUT CARDIOVERSION N/A 11/29/2017   Procedure: TRANSESOPHAGEAL ECHOCARDIOGRAM (TEE);  Surgeon: Donata Clay, Theron Arista, MD;  Location: [facility] OR;  Service: Open Heart Surgery;  Laterality: N/A;  . TOE AMPUTATION Left 11/28/15   4th toe        Home Medications    Prior to Admission medications   Medication Sig Start Date End Date Taking? Authorizing Provider  aspirin EC 325 MG EC tablet Take 1 tablet (325 mg total) by mouth daily. 12/04/17  Yes Barrett, Rae Roam, PA-C  Ferrous Sulfate (IRON) 325 (65 Fe) MG TABS Take 1 tablet (325 mg total) by mouth 2 (two) times daily after a meal. 12/04/17  Yes Barrett, Erin R, PA-C  gabapentin (NEURONTIN) 800 MG tablet TAKE 1 TABLET(800 MG) BY MOUTH THREE TIMES DAILY 06/12/18  Yes Wendee Beavers, DO  insulin detemir (LEVEMIR) 100 UNIT/ML injection Inject 0.2 mLs (20 Units total) into the skin at bedtime. 05/16/18  Yes Wendee Beavers, DO  losartan (COZAAR) 25 MG tablet TAKE 12.5 MG BY MOUTH DAILY 01/20/18  Yes [provider]  metoCLOPramide (REGLAN) 10 MG tablet Take 1 tablet (10 mg total) by mouth 4 (four) times daily -  before meals and at bedtime. 04/12/17  Yes Noralee Stain, DO  metoprolol succinate (TOPROL XL) 25 MG 24 hr tablet Take 0.5 tablets (12.5 mg total) by mouth daily. 01/23/18  Yes Barrett, Joline Salt, PA-C  ranitidine (ZANTAC) 150 MG tablet Take 1 tablet 2 (two) times daily by mouth. 10/01/17  Yes [provider]  rosuvastatin (CRESTOR) 40 MG tablet Take 1 tablet (40 mg total) by mouth daily. 09/14/17 06/25/25 Yes Chilton Si, MD    Family History Family History  Problem Relation Age of Onset  . Cancer Paternal Grandmother   . COPD Mother   . Heart failure Mother   . Anxiety disorder Mother   .  Depression Mother   . Fibromyalgia Mother   . Esophageal cancer Father        Smoker, still does  . Diabetes Sister   . Fibromyalgia Sister   . Diabetes Maternal Uncle   . Diabetes Maternal Grandmother   . GER disease Sister   . Liver disease Paternal Grandfather   . Hemachromatosis Paternal Grandfather   . Diabetes Maternal Grandfather     Social History Social History   Tobacco Use  . Smoking status: Never Smoker  . Smokeless tobacco: Never Used  Substance Use Topics  . Alcohol use: No  . Drug use: No     Allergies   Patient has no known allergies.   Review of Systems Review of Systems  Constitutional: Negative for fever.  Respiratory: Negative for shortness of breath.   Cardiovascular: Negative for chest pain.  Gastrointestinal: Positive for nausea and vomiting. Negative for abdominal pain.  Genitourinary: Negative for dysuria.  Musculoskeletal:  Negative for back pain.  Neurological: Positive for speech difficulty, weakness and headaches.  All other systems reviewed and are negative.    Physical Exam Updated Vital Signs BP 134/70   Pulse 74   Temp 97.6 F (36.4 C)   Resp (!) 21   Ht 6\' 2"  (1.88 m)   Wt 106.6 kg (235 lb)   SpO2 99%   BMI 30.17 kg/m   Physical Exam  Constitutional: He is oriented to person, place, and time.  Appears older than stated age, initially somnolent but easily arousable, opens eyes to voice and follows commands  HENT:  Head: Normocephalic and atraumatic.  Eyes: Pupils are equal, round, and reactive to light. EOM are normal.  Neck: Neck supple.  Cardiovascular: Normal rate, regular rhythm and normal heart sounds.  No murmur heard. Well-healing midline sternotomy scar  Pulmonary/Chest: Effort normal and breath sounds normal. No respiratory distress. He has no wheezes.  Abdominal: Soft. Bowel sounds are normal. There is no tenderness. There is no rebound.  Musculoskeletal: He exhibits edema.  Lymphadenopathy:    He has no  cervical adenopathy.  Neurological: He is alert and oriented to person, place, and time.  Cranial nerves II through XII intact, 5 out of 5 strength in all 4 extremities, patient can name and repeat, slight slurring noted but no dysarthria or dysphasia, no dysmetria  Skin: Skin is warm and dry.  Psychiatric: He has a normal mood and affect.  Nursing note and vitals reviewed.    ED Treatments / Results  Labs (all labs ordered are listed, but only abnormal results are displayed) Labs Reviewed  CBC - Abnormal; Notable for the following components:      Result Value   WBC 11.7 (*)    All other components within normal limits  COMPREHENSIVE METABOLIC PANEL - Abnormal; Notable for the following components:   Glucose, Bld 141 (*)    All other components within normal limits  CBG MONITORING, ED - Abnormal; Notable for the following components:   Glucose-Capillary 130 (*)    All other components within normal limits  I-STAT CHEM 8, ED - Abnormal; Notable for the following components:   Glucose, Bld 141 (*)    Calcium, Ion 1.10 (*)    All other components within normal limits  CBG MONITORING, ED - Abnormal; Notable for the following components:   Glucose-Capillary 139 (*)    All other components within normal limits  ETHANOL  PROTIME-INR  APTT  DIFFERENTIAL  RAPID URINE DRUG SCREEN, HOSP PERFORMED  URINALYSIS, ROUTINE W REFLEX MICROSCOPIC  I-STAT TROPONIN, ED    EKG EKG Interpretation  Date/Time:  Sunday June 25 2018 03:48:39 EDT Ventricular Rate:  75 PR Interval:    QRS Duration: 110 QT Interval:  425 QTC Calculation: 475 R Axis:   121 Text Interpretation:  Sinus rhythm Probable left atrial enlargement Abnormal inferior Q waves Abnormal lateral Q waves Anterior infarct, old Confirmed by Ross MarcusHorton, Courtney ([id number]) on 06/25/2018 4:57:52 AM   Radiology Ct Head Wo Contrast  Result Date: 06/25/2018 CLINICAL DATA:  Altered mental status.  Vomiting. EXAM: CT HEAD WITHOUT CONTRAST  TECHNIQUE: Contiguous axial images were obtained from the base of the skull through the vertex without intravenous contrast. COMPARISON:  None. FINDINGS: Brain: There is no mass, hemorrhage or extra-axial collection. The size and configuration of the ventricles and extra-axial CSF spaces are normal. Focus of hypoattenuation in the right corona radiata may be an old infarct. Otherwise Vascular: No abnormal hyperdensity of the major  intracranial arteries or dural venous sinuses. No intracranial atherosclerosis. Skull: The visualized skull base, calvarium and extracranial soft tissues are normal. Sinuses/Orbits: No fluid levels or advanced mucosal thickening of the visualized paranasal sinuses. No mastoid or middle ear effusion. The orbits are normal. IMPRESSION: 1. No acute intracranial abnormality. 2. 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 Electronically Signed   By: Deatra Robinson M.D.   On: 06/25/2018 04:43    Procedures Procedures (including critical care time)  CRITICAL CARE Performed by: Shon Baton   Total critical care time: 30 minutes  Critical care time was exclusive of separately billable procedures and treating other patients.  Critical care was necessary to treat or prevent imminent or life-threatening deterioration.  Critical care was time spent personally by me on the following activities: development of treatment plan with patient and/or surrogate as well as nursing, discussions with consultants, evaluation of patient's response to treatment, examination of patient, obtaining history from patient or surrogate, ordering and performing treatments and interventions, ordering and review of laboratory studies, ordering and review of radiographic studies, pulse oximetry and re-evaluation of patient's condition.   Medications Ordered in ED Medications  ondansetron (ZOFRAN) injection 4 mg (4 mg Intravenous Given 06/25/18 0430)     Initial Impression / Assessment and Plan / ED  Course  I have reviewed the triage vital signs and the nursing notes.  Pertinent labs & imaging results that were available during my care of the patient were reviewed by me and considered in my medical decision making (see chart for details).  Clinical Course as of Jun 26 615  Wynelle Link Jun 25, 2018  0520 Family is now at the bedside.  Initial work-up is largely reassuring.  They report when he was at home he had left-sided weakness and slurred speech.  They feel like his speech is slurred.  On repeat assessment, he does now have more slurring of speech.  He continues to have normal strength.  No drift.  Will consult neurology emergently given waxing and waning symptoms.  And for admission for TIA work-up regardless.   [CH]  [id number] Discussed with neurology.  Patient to be evaluated urgently given recurrence of speech difficulties.   [CH]  [id number] On neurologic evaluation, concern for again worsening of symptoms.  Upon arrival to the ED, patient had an NIH of 2 opening eyes to voice.  This has worsened.  Code stroke was initiated with last seen normal at time of arrival in the ED.   [CH]    Clinical Course User Index [CH] Horton, Mayer Masker, MD    Patient presents with an episode of left-sided deficits and speech difficulty.  At that time he was also having a headache.  He had an episode of loss of consciousness.  On my initial evaluation he was somnolent but arousable and able to provide history.  Nonfocal initially and vital signs are reassuring.  Stroke work-up was initiated.  CT scan shows no evidence of bleed.  EKG shows no evidence of arrhythmia.  Basic lab work is largely reassuring.  Initially patient was hypertensive.  However, blood pressure trended downwards.  On recheck, family is at the bedside and concerned that he has redeveloped speech difficulty.  He does have some slurring of speech.  No drift or weakness noted.  Neurology consulted and will evaluate the patient.  Plan for admission for TIA  or stroke work-up.  6:16 AM Code stroke initiated after neurology evaluation with last seen normal at arrival time in the  ED.  Final Clinical Impressions(s) / ED Diagnoses   Final diagnoses:  Transient alteration of awareness  TIA (transient ischemic attack)  Slurred speech    ED Discharge Orders    None       Horton, Mayer Masker, MD 06/25/18 1610    Shon Baton, MD 06/25/18 [phone]

## 2018-06-25 NOTE — H&P (Signed)
Chief Complaint: Dizziness, left-sided weakness, gait instability  History obtained from: Patient and Chart    HPI:                                                                                                                                       Travis [name] is an 47 y.o. male with past medical history of coronary artery disease status post CABG, chronic systolic and diastolic heart failure, diabetes mellitus, peripheral neuropathy, hypertension presents to the emergency department after having sudden onset difficulty walking and feeling lightheaded around  2 AM this morning.    The patient was awake at the time and suddenly noticed that he had trouble getting around and felt dizzy.  EMS was called and at the time they noticed may be mild left-sided weakness and slurred speech.  However that improved and when he was assessed by ED physician, patient was almost back to baseline with no neurological deficits per EDP.  The family however feel he still had some mild slurred speech that did not resolve since 2 AM.  I received this consult at 6 AM and was told the patient started to have mild slurred speech again.  upon assessment of the patient, he had ataxia in all 4 extremities with drift in left arm and left leg.  Appeared to have mild visual field deficits in the left eye which he had told me he was chronic, however also was unable to move his left eye towards the right side appeared to have an INO. NIHSS was 7 and as he was still within the window, TPA was offered after explaining risk versus benefit the patient.  Agreed to receive TPA.  Stat CTA  of his head was performed demonstrated a P2 occlusion.   Date last known well:  7.14.19 Time last known well: 1.55 am tPA Given: yes NIHSS: 7 Baseline MRS 0   Past Medical History:  Diagnosis Date  . Acute on chronic systolic and diastolic heart failure, NYHA class 3 (HCC) 09/14/2017  . Acute osteomyelitis of metatarsal bone of left foot  (HCC) 11/12/2015  . CAD in native artery 09/14/2017  . Closed fracture of right tibial plateau 11/11/2015  . Depression with anxiety   . Diabetes mellitus    INSULIN DEPENDENT Type II  . Diabetic peripheral neuropathy associated with type 2 diabetes mellitus (HCC) 08/30/2008   Qualifier: Diagnosis of  By: Daphine Deutscher FNP, Zena Amos    . Diabetic ulcer of left foot (HCC) 11/14/2015  . Gastroparesis   . GERD (gastroesophageal reflux disease)   . Hypertension   . Left ventricular aneurysm   . Osteomyelitis (HCC)   . Osteoporosis 11/12/2015  . Peripheral neuropathy   . Shortness of breath dyspnea    with exertion  . Vitamin D deficiency 11/12/2015    Past Surgical History:  Procedure Laterality Date  . AMPUTATION Left 11/28/2015  Procedure: Left 4th Ray Amputation;  Surgeon: Nadara Mustard, MD;  Location: [facility] OR;  Service: Orthopedics;  Laterality: Left;  . AMPUTATION Left 10/29/2016   Procedure: LEFT 5TH RAY AMPUTATION;  Surgeon: Nadara Mustard, MD;  Location: [facility] OR;  Service: Orthopedics;  Laterality: Left;  . ANTERIOR VITRECTOMY Left 04/12/2016   Procedure: ANTERIOR CHAMBER WASH OUT WITH GAS FLUID EXCHANGE;  Surgeon: Carmela Rima, MD;  Location: [facility] OR;  Service: Ophthalmology;  Laterality: Left;  . CARDIAC CATHETERIZATION    . CATARACT EXTRACTION Left   . CORONARY ARTERY BYPASS GRAFT N/A 11/29/2017    LIMA-LAD, SVG-PDA, SVG-CFX  Procedure: CORONARY ARTERY BYPASS GRAFTING (CABG) times three using the left saphaneous vien. harvested endoscopicly and left internal mammary artery.;  Surgeon: Kerin Perna, MD;  Location: [facility] OR;  Service: Open Heart Surgery;  Laterality: N/A;  . EYE SURGERY     left eye surgery 04/2016  . KNEE SURGERY Left   . RIGHT/LEFT HEART CATH AND CORONARY ANGIOGRAPHY N/A 10/19/2017   Procedure: RIGHT/LEFT HEART CATH AND CORONARY ANGIOGRAPHY;  Surgeon: Swaziland, [name], MD;  Location: [facility] INVASIVE CV LAB;  Service: Cardiovascular;  Laterality: N/A;  . SHOULDER SURGERY Right    . TEE WITHOUT CARDIOVERSION N/A 11/29/2017   Procedure: TRANSESOPHAGEAL ECHOCARDIOGRAM (TEE);  Surgeon: Donata Clay, Theron Arista, MD;  Location: [facility] OR;  Service: Open Heart Surgery;  Laterality: N/A;  . TOE AMPUTATION Left 11/28/15   4th toe    Family History  Problem Relation Age of Onset  . Cancer Paternal Grandmother   . COPD Mother   . Heart failure Mother   . Anxiety disorder Mother   . Depression Mother   . Fibromyalgia Mother   . Esophageal cancer Father        Smoker, still does  . Diabetes Sister   . Fibromyalgia Sister   . Diabetes Maternal Uncle   . Diabetes Maternal Grandmother   . GER disease Sister   . Liver disease Paternal Grandfather   . Hemachromatosis Paternal Grandfather   . Diabetes Maternal Grandfather    Social History:  reports that he has never smoked. He has never used smokeless tobacco. He reports that he does not drink alcohol or use drugs.  Allergies: No Known Allergies  Medications:                                                                                                                        I reviewed home medications. ASA 325 mg daily   ROS:  14 systems reviewed and negative except above    Examination:                                                                                                      General: Appears well-developed and well-nourished.  Psych: Affect appropriate to situation Eyes: No scleral injection HENT: No OP obstrucion Head: Normocephalic.  Cardiovascular: Normal rate and regular rhythm.  Respiratory: Effort normal and breath sounds normal to anterior ascultation GI: Soft.  No distension. There is no tenderness.  Skin: WDI    Neurological Examination Mental Status: Alert, oriented, thought content appropriate.  Speech fluent without evidence of aphasia. Mild dysarthria. Able to  follow 3 step commands without difficulty. Cranial Nerves: II: Visual fields: Left homonomous hemianopsia III,IV, VI: ptosis not present, extra-ocular motions initially impaired adduction of left eye when asked to turn to the right side.  V,VII: smile symmetric, facial light touch sensation normal bilaterally VIII: hearing normal bilaterally IX,X: uvula rises symmetrically XI: bilateral shoulder shrug XII: midline tongue extension Motor: Right : Upper extremity   5/5    Left:     Upper extremity   4+/5  Lower extremity   5/5     Lower extremity   4+/5 Tone and bulk:normal tone throughout; no atrophy noted Sensory: Pinprick and light touch intact throughout, bilaterally Deep Tendon Reflexes: 2+ and symmetric throughout Plantars: Right: downgoing   Left: downgoing Cerebellar: Dysmetria left >right side and impaired heel to shin Gait: normal gait and station     Lab Results: Basic Metabolic Panel: Recent Labs  Lab 06/25/18 0404 06/25/18 0409  NA 140 139  K 4.2 4.1  CL 105 104  CO2 24  --   GLUCOSE 141* 141*  BUN 13 15  CREATININE 1.08 1.00  CALCIUM 9.5  --     CBC: Recent Labs  Lab 06/25/18 0404 06/25/18 0409  WBC 11.7*  --   NEUTROABS 7.6  --   HGB 13.9 13.9  HCT 42.2 41.0  MCV 89.4  --   PLT 249  --     Coagulation Studies: Recent Labs    06/25/18 0404  LABPROT 14.3  INR 1.12    Imaging: Ct Angio Head W Or Wo Contrast  Result Date: 06/25/2018 CLINICAL DATA:  Code stroke.  Left-sided drift and ataxia EXAM: CT ANGIOGRAPHY HEAD AND NECK TECHNIQUE: Multidetector CT imaging of the head and neck was performed using the standard protocol during bolus administration of intravenous contrast. Multiplanar CT image reconstructions and MIPs were obtained to evaluate the vascular anatomy. Carotid stenosis measurements (when applicable) are obtained utilizing NASCET criteria, using the distal internal carotid diameter as the denominator. COMPARISON:  None. Head CT  06/25/2018 FINDINGS: CT HEAD FINDINGS Brain: There is no mass, hemorrhage or extra-axial collection. The size and configuration of the ventricles and extra-axial CSF spaces are normal. There is no acute or chronic infarction. The brain parenchyma is normal. Unchanged focus of hypoattenuation in the right corona radiata. Vascular: No abnormal hyperdensity of the major intracranial arteries or dural venous sinuses. No intracranial atherosclerosis. Skull: The  visualized skull base, calvarium and extracranial soft tissues are normal. Sinuses/Orbits: No fluid levels or advanced mucosal thickening of the visualized paranasal sinuses. No mastoid or middle ear effusion. The orbits are normal. ASPECTS (Alberta Stroke Program Early CT Score) - Ganglionic level infarction (caudate, lentiform nuclei, internal capsule, insula, M1-M3 cortex): 7 - Supraganglionic infarction (M4-M6 cortex): 3 Total score (0-10 with 10 being normal): 10 CTA NECK FINDINGS AORTIC ARCH: There is no calcific atherosclerosis of the aortic arch. There is no aneurysm, dissection or hemodynamically significant stenosis of the visualized ascending aorta and aortic arch. Normal variant aortic arch branching pattern with the brachiocephalic and left common carotid arteries sharing a common origin. The visualized proximal subclavian arteries are widely patent. RIGHT CAROTID SYSTEM: --Common carotid artery: Widely patent origin without common carotid artery dissection or aneurysm. --Internal carotid artery: No dissection, occlusion or aneurysm. No hemodynamically significant stenosis. --External carotid artery: No acute abnormality. LEFT CAROTID SYSTEM: --Common carotid artery: Widely patent origin without common carotid artery dissection or aneurysm. --Internal carotid artery:No dissection, occlusion or aneurysm. No hemodynamically significant stenosis. --External carotid artery: No acute abnormality. VERTEBRAL ARTERIES: Codominant configuration. Both origins  are normal. No dissection, occlusion or flow-limiting stenosis to the vertebrobasilar confluence. SKELETON: There is no bony spinal canal stenosis. No lytic or blastic lesion. OTHER NECK: Normal pharynx, larynx and major salivary glands. No cervical lymphadenopathy. Unremarkable thyroid gland. UPPER CHEST: Lung apices are clear. CTA HEAD FINDINGS ANTERIOR CIRCULATION: --Intracranial internal carotid arteries: Normal. --Anterior cerebral arteries: Normal. Both A1 segments are present. Patent anterior communicating artery. --Middle cerebral arteries: Normal. --Posterior communicating arteries: Present on the left, absent on the right. POSTERIOR CIRCULATION: --Basilar artery: Normal. --Posterior cerebral arteries: There is occlusion of the right PCA P2 segment just beyond the P1-P2 junction. Left PCA is normal. --Superior cerebellar arteries: Normal. --Inferior cerebellar arteries: Normal anterior and posterior inferior cerebellar arteries. VENOUS SINUSES: As permitted by contrast timing, patent. ANATOMIC VARIANTS: None DELAYED PHASE: No parenchymal contrast enhancement. Review of the MIP images confirms the above findings. IMPRESSION: 1. No acute hemorrhage. ASPECTS is 10. These results were communicated to Dr. Georgiana SpinnerSushanth Dahlila Pfahler at 6:47 am on 06/25/2018 by text page via the [facility] messaging system. 2. Occlusion of the right posterior cerebral artery P2 segment just beyond its P1-P2 junction point. These results were communicated to Dr. Georgiana SpinnerSushanth Haden Cavenaugh at 6:55 am on 06/25/2018 by telephone. 3. Normal CTA of the neck. Electronically Signed   By: Deatra RobinsonKevin  Herman M.D.   On: 06/25/2018 06:59   Ct Head Wo Contrast  Result Date: 06/25/2018 CLINICAL DATA:  Altered mental status.  Vomiting. EXAM: CT HEAD WITHOUT CONTRAST TECHNIQUE: Contiguous axial images were obtained from the base of the skull through the vertex without intravenous contrast. COMPARISON:  None. FINDINGS: Brain: There is no mass, hemorrhage or extra-axial  collection. The size and configuration of the ventricles and extra-axial CSF spaces are normal. Focus of hypoattenuation in the right corona radiata may be an old infarct. Otherwise Vascular: No abnormal hyperdensity of the major intracranial arteries or dural venous sinuses. No intracranial atherosclerosis. Skull: The visualized skull base, calvarium and extracranial soft tissues are normal. Sinuses/Orbits: No fluid levels or advanced mucosal thickening of the visualized paranasal sinuses. No mastoid or middle ear effusion. The orbits are normal. IMPRESSION: 1. No acute intracranial abnormality. 2. 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 Electronically Signed   By: Deatra RobinsonKevin  Herman M.D.   On: 06/25/2018 04:43   Ct Angio Neck W Or Wo Contrast  Result Date:  06/25/2018 CLINICAL DATA:  Code stroke.  Left-sided drift and ataxia EXAM: CT ANGIOGRAPHY HEAD AND NECK TECHNIQUE: Multidetector CT imaging of the head and neck was performed using the standard protocol during bolus administration of intravenous contrast. Multiplanar CT image reconstructions and MIPs were obtained to evaluate the vascular anatomy. Carotid stenosis measurements (when applicable) are obtained utilizing NASCET criteria, using the distal internal carotid diameter as the denominator. COMPARISON:  None. Head CT 06/25/2018 FINDINGS: CT HEAD FINDINGS Brain: There is no mass, hemorrhage or extra-axial collection. The size and configuration of the ventricles and extra-axial CSF spaces are normal. There is no acute or chronic infarction. The brain parenchyma is normal. Unchanged focus of hypoattenuation in the right corona radiata. Vascular: No abnormal hyperdensity of the major intracranial arteries or dural venous sinuses. No intracranial atherosclerosis. Skull: The visualized skull base, calvarium and extracranial soft tissues are normal. Sinuses/Orbits: No fluid levels or advanced mucosal thickening of the visualized paranasal sinuses. No mastoid or  middle ear effusion. The orbits are normal. ASPECTS (Alberta Stroke Program Early CT Score) - Ganglionic level infarction (caudate, lentiform nuclei, internal capsule, insula, M1-M3 cortex): 7 - Supraganglionic infarction (M4-M6 cortex): 3 Total score (0-10 with 10 being normal): 10 CTA NECK FINDINGS AORTIC ARCH: There is no calcific atherosclerosis of the aortic arch. There is no aneurysm, dissection or hemodynamically significant stenosis of the visualized ascending aorta and aortic arch. Normal variant aortic arch branching pattern with the brachiocephalic and left common carotid arteries sharing a common origin. The visualized proximal subclavian arteries are widely patent. RIGHT CAROTID SYSTEM: --Common carotid artery: Widely patent origin without common carotid artery dissection or aneurysm. --Internal carotid artery: No dissection, occlusion or aneurysm. No hemodynamically significant stenosis. --External carotid artery: No acute abnormality. LEFT CAROTID SYSTEM: --Common carotid artery: Widely patent origin without common carotid artery dissection or aneurysm. --Internal carotid artery:No dissection, occlusion or aneurysm. No hemodynamically significant stenosis. --External carotid artery: No acute abnormality. VERTEBRAL ARTERIES: Codominant configuration. Both origins are normal. No dissection, occlusion or flow-limiting stenosis to the vertebrobasilar confluence. SKELETON: There is no bony spinal canal stenosis. No lytic or blastic lesion. OTHER NECK: Normal pharynx, larynx and major salivary glands. No cervical lymphadenopathy. Unremarkable thyroid gland. UPPER CHEST: Lung apices are clear. CTA HEAD FINDINGS ANTERIOR CIRCULATION: --Intracranial internal carotid arteries: Normal. --Anterior cerebral arteries: Normal. Both A1 segments are present. Patent anterior communicating artery. --Middle cerebral arteries: Normal. --Posterior communicating arteries: Present on the left, absent on the right. POSTERIOR  CIRCULATION: --Basilar artery: Normal. --Posterior cerebral arteries: There is occlusion of the right PCA P2 segment just beyond the P1-P2 junction. Left PCA is normal. --Superior cerebellar arteries: Normal. --Inferior cerebellar arteries: Normal anterior and posterior inferior cerebellar arteries. VENOUS SINUSES: As permitted by contrast timing, patent. ANATOMIC VARIANTS: None DELAYED PHASE: No parenchymal contrast enhancement. Review of the MIP images confirms the above findings. IMPRESSION: 1. No acute hemorrhage. ASPECTS is 10. These results were communicated to Dr. Georgiana Spinner Ciani Rutten at 6:47 am on 06/25/2018 by text page via the [facility] messaging system. 2. Occlusion of the right posterior cerebral artery P2 segment just beyond its P1-P2 junction point. These results were communicated to Dr. Georgiana Spinner Ciro Tashiro at 6:55 am on 06/25/2018 by telephone. 3. Normal CTA of the neck. Electronically Signed   By: Deatra Robinson M.D.   On: 06/25/2018 06:59   Ct Head Code Stroke Wo Contrast`  Result Date: 06/25/2018 CLINICAL DATA:  Code stroke.  Left-sided drift and ataxia EXAM: CT ANGIOGRAPHY HEAD AND NECK TECHNIQUE: Multidetector  CT imaging of the head and neck was performed using the standard protocol during bolus administration of intravenous contrast. Multiplanar CT image reconstructions and MIPs were obtained to evaluate the vascular anatomy. Carotid stenosis measurements (when applicable) are obtained utilizing NASCET criteria, using the distal internal carotid diameter as the denominator. COMPARISON:  None. Head CT 06/25/2018 FINDINGS: CT HEAD FINDINGS Brain: There is no mass, hemorrhage or extra-axial collection. The size and configuration of the ventricles and extra-axial CSF spaces are normal. There is no acute or chronic infarction. The brain parenchyma is normal. Unchanged focus of hypoattenuation in the right corona radiata. Vascular: No abnormal hyperdensity of the major intracranial arteries or dural venous sinuses.  No intracranial atherosclerosis. Skull: The visualized skull base, calvarium and extracranial soft tissues are normal. Sinuses/Orbits: No fluid levels or advanced mucosal thickening of the visualized paranasal sinuses. No mastoid or middle ear effusion. The orbits are normal. ASPECTS (Alberta Stroke Program Early CT Score) - Ganglionic level infarction (caudate, lentiform nuclei, internal capsule, insula, M1-M3 cortex): 7 - Supraganglionic infarction (M4-M6 cortex): 3 Total score (0-10 with 10 being normal): 10 CTA NECK FINDINGS AORTIC ARCH: There is no calcific atherosclerosis of the aortic arch. There is no aneurysm, dissection or hemodynamically significant stenosis of the visualized ascending aorta and aortic arch. Normal variant aortic arch branching pattern with the brachiocephalic and left common carotid arteries sharing a common origin. The visualized proximal subclavian arteries are widely patent. RIGHT CAROTID SYSTEM: --Common carotid artery: Widely patent origin without common carotid artery dissection or aneurysm. --Internal carotid artery: No dissection, occlusion or aneurysm. No hemodynamically significant stenosis. --External carotid artery: No acute abnormality. LEFT CAROTID SYSTEM: --Common carotid artery: Widely patent origin without common carotid artery dissection or aneurysm. --Internal carotid artery:No dissection, occlusion or aneurysm. No hemodynamically significant stenosis. --External carotid artery: No acute abnormality. VERTEBRAL ARTERIES: Codominant configuration. Both origins are normal. No dissection, occlusion or flow-limiting stenosis to the vertebrobasilar confluence. SKELETON: There is no bony spinal canal stenosis. No lytic or blastic lesion. OTHER NECK: Normal pharynx, larynx and major salivary glands. No cervical lymphadenopathy. Unremarkable thyroid gland. UPPER CHEST: Lung apices are clear. CTA HEAD FINDINGS ANTERIOR CIRCULATION: --Intracranial internal carotid arteries:  Normal. --Anterior cerebral arteries: Normal. Both A1 segments are present. Patent anterior communicating artery. --Middle cerebral arteries: Normal. --Posterior communicating arteries: Present on the left, absent on the right. POSTERIOR CIRCULATION: --Basilar artery: Normal. --Posterior cerebral arteries: There is occlusion of the right PCA P2 segment just beyond the P1-P2 junction. Left PCA is normal. --Superior cerebellar arteries: Normal. --Inferior cerebellar arteries: Normal anterior and posterior inferior cerebellar arteries. VENOUS SINUSES: As permitted by contrast timing, patent. ANATOMIC VARIANTS: None DELAYED PHASE: No parenchymal contrast enhancement. Review of the MIP images confirms the above findings. IMPRESSION: 1. No acute hemorrhage. ASPECTS is 10. These results were communicated to Dr. Georgiana Spinner Rumaisa Schnetzer at 6:47 am on 06/25/2018 by text page via the [facility] messaging system. 2. Occlusion of the right posterior cerebral artery P2 segment just beyond its P1-P2 junction point. These results were communicated to Dr. Georgiana Spinner Minerva Bluett at 6:55 am on 06/25/2018 by telephone. 3. Normal CTA of the neck. Electronically Signed   By: Deatra Robinson M.D.   On: 06/25/2018 06:59     ASSESSMENT AND PLAN   47 y.o. male with past medical history of coronary artery disease status post CABG, chronic systolic and diastolic heart failure, diabetes mellitus, peripheral neuropathy who presents to the ED with slurred speech, ataxia, left-sided weakness.  The episode where he  becomes less responsive and then improves again.  EDP initially thought he was a TIA and hence not stroke alerted.  On assessment patient has significant deficits concerning for posterior circulation stroke and TPA was administered.  CT angiogram does show a proximal P2 occlusion.  P2 occlusions are generally not considered to be a large vessel occlusion in the posterior circulation and the reserve for vascular occlusions and proximal P1 occlusions.  In  addition this patient's deficits are not life-threatening and therefore not a candidate for mechanical thrombectomy.   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 CHF Etiology: suspect cardioembolic  Recommend # MRI of the brain without contrast unable to #Transthoracic Echo  #We will hold aspirin until 24-hour post TPA 24-hour post TPA CT scan ordered# #Start or continue Atorvastatin 80 mg/other high intensity statin # HBAIC and Lipid profile # Telemetry monitoring # Frequent neuro checks # NPO until passes stroke swallow screen    Blood pressure goals Permissive hypertension up to 180/105 mmHg As needed labetalol ordered   Chronic systolic and diastolic heart failure We will hold aspirin We will obtain echocardiogram May consider TEE to rule out apical thrombus if TTE is negative   Coronary artery disease Currently we will hold aspirin until 24 hours post TPA  Diabetes mellitus Sliding scale insulin ordered A1c pending   Please page stroke NP  Or  PA  Or MD from 8am -4 pm  as this patient from this time will be  followed by the stroke.   You can look them up on [email or web address]  Password [email or web address]    This patient is neurologically critically ill due to PCA stroke s/p TPA. He is at risk for significant risk of neurological worsening from cerebral edema,  death from brain herniation, heart failure, hemorrhagic conversion, infection, respiratory failure and seizure. This patient's care requires constant monitoring of vital signs, hemodynamics, respiratory and cardiac monitoring, review of multiple databases, neurological assessment, discussion with family, other specialists and medical decision making of high complexity.  I spent 80  minutes of neurocritical time in the care of this patient.      Travis [name] [facility] Pager Number [phone]

## 2018-06-25 NOTE — Progress Notes (Signed)
Patient states he takes 2400 mg of Neurontin at night. Spoke with Dr. Roda ShuttersXu, who states that is okay.

## 2018-06-25 NOTE — Progress Notes (Signed)
STROKE TEAM PROGRESS NOTE   SUBJECTIVE (INTERVAL HISTORY) His RN is at the bedside.  Pt recounted HPI with me. He was about to go to bed last night and he felt dizziness with room spinning. He sat down at the edge of bed and asked his son to give some food to him as he thought he had low sugar. And then he felt his left arm was weak and then became both arm weakness. He could not move them at all and he could hear his sister and his son but not able to respond. He denies LOC. He also felt his whole head hurts at that time. His sister called EMS, checked his sugar was high not low, and he was sent to ED with code stroke. On arrival, his symptoms improved. He received tPA.   He has extensive PMH including DM retinopathy. He said he has had left upper quadrantanopia for 1-2 years, which is not new for him. His sugar at home controlled well around 110-120. He takes levemir 20 U daily.    OBJECTIVE Temp:  [97.6 F (36.4 C)] 97.6 F (36.4 C) (07/14 0400) Pulse Rate:  [70-82] 73 (07/14 0730) Cardiac Rhythm: Normal sinus rhythm (07/14 0355) Resp:  [11-22] 16 (07/14 0730) BP: (127-153)/(66-84) 149/84 (07/14 0730) SpO2:  [97 %-100 %] 98 % (07/14 0730) Weight:  [219 lb 12.8 oz (99.7 kg)-235 lb (106.6 kg)] 219 lb 12.8 oz (99.7 kg) (07/14 0731)  CBC:  Recent Labs  Lab 06/25/18 0404 06/25/18 0409  WBC 11.7*  --   NEUTROABS 7.6  --   HGB 13.9 13.9  HCT 42.2 41.0  MCV 89.4  --   PLT 249  --     Basic Metabolic Panel:  Recent Labs  Lab 06/25/18 0404 06/25/18 0409  NA 140 139  K 4.2 4.1  CL 105 104  CO2 24  --   GLUCOSE 141* 141*  BUN 13 15  CREATININE 1.08 1.00  CALCIUM 9.5  --     Lipid Panel:     Component Value Date/Time   CHOL 75 (L) 01/23/2018 0913   TRIG 68 01/23/2018 0913   HDL 32 (L) 01/23/2018 0913   CHOLHDL 2.3 01/23/2018 0913   CHOLHDL 6.0 Ratio 05/14/2009 2201   VLDL 45 (H) 05/14/2009 2201   LDLCALC 29 01/23/2018 0913   HgbA1c:  Lab Results  Component Value Date    HGBA1C 7.0 03/03/2018   Urine Drug Screen:     Component Value Date/Time   LABOPIA NONE DETECTED 04/07/2017 2103   COCAINSCRNUR NONE DETECTED 04/07/2017 2103   LABBENZ NONE DETECTED 04/07/2017 2103   AMPHETMU NONE DETECTED 04/07/2017 2103   THCU POSITIVE (A) 04/07/2017 2103   LABBARB NONE DETECTED 04/07/2017 2103    Alcohol Level     Component Value Date/Time   ETH <10 06/25/2018 0404    IMAGING I have personally reviewed the radiological images below and agree with the radiology interpretations.  Ct Head Code Stroke Wo Contrast Ct Angio Head W Or Wo Contrast Ct Angio Neck W Or Wo Contrast 06/25/2018  IMPRESSION:  1. No acute hemorrhage. ASPECTS is 10.  2. Occlusion of the right posterior cerebral artery P2 segment just beyond its P1-P2 junction point.   Ct Head Wo Contrast 06/25/2018 IMPRESSION:  1. No acute intracranial abnormality.  2. 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   MRI Brain WO Contrast - pending  Transthoracic Echocardiogram - pending    PHYSICAL EXAM  Temp:  [97.6 F (  36.4 C)-98 F (36.7 C)] 98 F (36.7 C) (07/14 0758) Pulse Rate:  [69-84] 81 (07/14 1100) Resp:  [11-22] 18 (07/14 1100) BP: (120-153)/(56-96) 136/62 (07/14 1100) SpO2:  [93 %-100 %] 96 % (07/14 1100) Weight:  [219 lb 12.8 oz (99.7 kg)-235 lb (106.6 kg)] 219 lb 12.8 oz (99.7 kg) (07/14 0731)  General - Well nourished, well developed, in no apparent distress.  Ophthalmologic - fundi not visualized due to noncooperation.  Cardiovascular - Regular rate and rhythm with no murmur.  Mental Status -  Level of arousal and orientation to time, place, and person were intact. Language including expression, naming, repetition, comprehension was assessed and found intact. Fund of Knowledge was assessed and was intact.  Cranial Nerves II - XII - II - left upper quadrantanopia, chronic as per pt. III, IV, VI - Extraocular movements intact. V - Facial sensation intact  bilaterally. VII - Facial movement intact bilaterally. VIII - Hearing & vestibular intact bilaterally. X - Palate elevates symmetrically. XI - Chin turning & shoulder shrug intact bilaterally. XII - Tongue protrusion intact.  Motor Strength - The patient's strength was normal in all extremities and pronator drift was absent.  Bulk was normal and fasciculations were absent.   Motor Tone - Muscle tone was assessed at the neck and appendages and was normal.  Reflexes - The patient's reflexes were symmetrical in all extremities and he had no pathological reflexes.  Sensory - Light touch, temperature/pinprick were assessed and were symmetrical.    Coordination - The patient had normal movements in the hands with no ataxia or dysmetria.  Tremor was absent.  Gait and Station - deferred.   ASSESSMENT/PLAN Travis [name] is a 47 y.o. male with history of DM, PVD with left toe amputations, CAD with CABG in 11/2017, anxiety, depression, diabetic neuropathy and retinopathy, DM gastroparesis, hypertension, cardiomyopathy, and CHF presenting with dizziness, ataxia, dysarthria, and difficulty ambulating. The patient received IV TPA on Sunday, 06/25/2018 at 0630.  Suspected stroke s/p tPA  Resultant  Chronic left upper quadrantanopia  CT head - No acute intracranial abnormality. likely old right corona radiata infarct.   MRI head - pending  CTA H&N - Occlusion of the right PCA P1-P2 junction.   2D Echo - pending  LDL - pending  HgbA1c - pending  VTE prophylaxis - SCDs  aspirin 325 mg daily prior to admission, now on No antithrombotic S/P tPA within 24hr  Patient will be counseled to be compliant with his antithrombotic medications  Ongoing aggressive stroke risk factor management  Therapy recommendations:  pending  Disposition:  Pending  Hypertension  Stable . Permissive hypertension (OK if < 180/105) but gradually normalize in 5-7 days . Resume beta blocker . Long-term BP  goal normotensive  Hyperlipidemia  Lipid lowering medication PTA: Crestor 40 mg daily  LDL pending, goal < 70  Current lipid lowering medication: Resume Crestor  Continue statin at discharge  Diabetes  HgbA1c pending, goal < 7.0  Uncontrolled  On DM diet  Resume levemir 20U  SSI  CBG monitoring  Other Stroke Risk Factors  Possible old infarct by imaging  Coronary artery disease s/p CABG  Cardiomyopathy  CHF  PVD with left toe amputation  Other Active Problems  Mild leukocytosis  DM neuropathy on neurontin  DM retinopathy  DM gastroparesis - on reglan  Hospital day # 0  This patient is critically ill due to suspected stroke s/p tPA, DM, hyperglycemia and at significant risk of neurological worsening, death form  recurrent stroke, hemorrhagic conversion, seizure, DKA, heart failure. This patient's care requires constant monitoring of vital signs, hemodynamics, respiratory and cardiac monitoring, review of multiple databases, neurological assessment, discussion with family, other specialists and medical decision making of high complexity. I spent 40 minutes of neurocritical care time in the care of this patient.  Marvel Plan, MD PhD Stroke Neurology 06/25/2018 11:25 AM   To contact Stroke Continuity provider, please refer to [email or web address]. After hours, contact General Neurology

## 2018-06-25 NOTE — ED Triage Notes (Signed)
Pt BIB [facility] for eval of altered mental status. Pt called neighbor reporting he felt as though he was having a stroke. EMS arrived found ot seated in bed, vomiting. Pt stated he needed to lay down after vomiting. At that time pt became extremely diaphoretic and developed snoring respirations and went unresponsive. Pt arrives w/ NPA and NRB in place, responsive to voice, maintaining airway at that time. EMS reports fire noted some L sided weakness and slurred speech

## 2018-06-25 NOTE — Progress Notes (Signed)
OT Cancellation Note  Patient Details Name: Travis [name] MRN: [id number] DOB: 08/09/1971   Cancelled Treatment:    Reason Eval/Treat Not Completed: Active bedrest order.  Will reattempt.  [name], OTR/L [phone]   [name], [name] 06/25/2018, 9:46 AM

## 2018-06-25 NOTE — Progress Notes (Signed)
  Echocardiogram 2D Echocardiogram has been performed.  Janalyn HarderWest, Tiare Rohlman R 06/25/2018, 2:46 PM

## 2018-06-26 ENCOUNTER — Ambulatory Visit (HOSPITAL_COMMUNITY): Payer: Medicaid Other

## 2018-06-26 ENCOUNTER — Inpatient Hospital Stay (HOSPITAL_COMMUNITY): Payer: Medicare Other

## 2018-06-26 DIAGNOSIS — E11319 Type 2 diabetes mellitus with unspecified diabetic retinopathy without macular edema: Secondary | ICD-10-CM

## 2018-06-26 DIAGNOSIS — E1142 Type 2 diabetes mellitus with diabetic polyneuropathy: Secondary | ICD-10-CM

## 2018-06-26 DIAGNOSIS — K3184 Gastroparesis: Secondary | ICD-10-CM

## 2018-06-26 DIAGNOSIS — M7989 Other specified soft tissue disorders: Secondary | ICD-10-CM

## 2018-06-26 LAB — GLUCOSE, CAPILLARY
GLUCOSE-CAPILLARY: 223 mg/dL — AB (ref 70–99)
Glucose-Capillary: 149 mg/dL — ABNORMAL HIGH (ref 70–99)
Glucose-Capillary: 177 mg/dL — ABNORMAL HIGH (ref 70–99)
Glucose-Capillary: 189 mg/dL — ABNORMAL HIGH (ref 70–99)

## 2018-06-26 LAB — LIPID PANEL
CHOL/HDL RATIO: 5.4 ratio
CHOLESTEROL: 141 mg/dL (ref 0–200)
HDL: 26 mg/dL — AB (ref >40)
LDL Cholesterol: 73 mg/dL (ref 0–99)
TRIGLYCERIDES: 211 mg/dL — AB (ref <150)
VLDL: 42 mg/dL — ABNORMAL HIGH (ref 0–40)

## 2018-06-26 LAB — HEMOGLOBIN A1C
HEMOGLOBIN A1C: 6.9 % — AB (ref 4.8–5.6)
MEAN PLASMA GLUCOSE: 151.33 mg/dL

## 2018-06-26 MED ORDER — INSULIN DETEMIR 100 UNIT/ML ~~LOC~~ SOLN
10.0000 [IU] | Freq: Every day | SUBCUTANEOUS | Status: DC
  Filled 2018-06-26 (×3): qty 0.1

## 2018-06-26 MED ORDER — ASPIRIN EC 325 MG PO TBEC
325.0000 mg | DELAYED_RELEASE_TABLET | Freq: Every day | ORAL | Status: DC
  Administered 2018-06-26 – 2018-06-27 (×2): 325 mg via ORAL
  Filled 2018-06-26 (×2): qty 1

## 2018-06-26 MED ORDER — CLOPIDOGREL BISULFATE 75 MG PO TABS
75.0000 mg | ORAL_TABLET | Freq: Every day | ORAL | Status: DC
  Administered 2018-06-26 – 2018-06-27 (×2): 75 mg via ORAL
  Filled 2018-06-26 (×2): qty 1

## 2018-06-26 NOTE — Consult Note (Signed)
WOC Nurse wound consult note Reason for Consult: Two chronic, nonhealing wounds on the left foot, great toe lateral aspect and 3rd toe anterior aspect. Wound type:Neuropathic.  Wounds on left foot, great toe, lateral aspect are due to altered gait following amputation in 2016 and bearing weight on this aspect of the foot. Wound on anterior 3rd digit is from patient's shoe rubbing (toe box of shoe with inadequate height). Pressure Injury POA: NA Measurement: Left foot, lateral aspect of great toe: 4.2cm x 2cm area of dry, hard, firmly adherent eschar. Toe with blue hue, edema. No warmth, no induration. Left foot, 3rd digit, anterior aspect: 0.8cm round area of eschar with loosened edge and recent bleeding when sock removed. Wound bed: As described above Drainage (amount, consistency, odor) As described above Periwound:As described above Dressing procedure/placement/frequency: Patient requires an offloading orthotic for his footwear or he risks further surgeries/amputations to the foot. Consider referral/consult to Dr. Lajoyce Cornersuda (patient's surgeon) for follow up.  He will likely refer/arrange for pedorthist consultation and offer recommendations for protective shoewear.   In the meantime, I will implement conservative care orders using twice daily application of a betadine swabstick to both provide an astringent and antimicrobial environment.  The adjunct to topical care is to employ off-loading strategies until a custom orthotic can be provided.  WOC nursing team will not follow, but will remain available to this patient, the nursing and medical teams.  Please re-consult if needed. Thanks, Ladona MowLaurie Mahathi Pokorney, MSN, RN, GNP, Hans EdenCWOCN, CWON-AP, FAAN  Pager# [phone]

## 2018-06-26 NOTE — H&P (View-Only) (Signed)
STROKE TEAM PROGRESS NOTE   SUBJECTIVE (INTERVAL HISTORY) Worked with therapy this am, no needs. Undergoing LE DVT during exam. No new complaints. Does want to stay in ICU because "they have the best nurses".   OBJECTIVE CBC:  Recent Labs  Lab 06/25/18 0404 06/25/18 0409  WBC 11.7*  --   NEUTROABS 7.6  --   HGB 13.9 13.9  HCT 42.2 41.0  MCV 89.4  --   PLT 249  --     Basic Metabolic Panel:  Recent Labs  Lab 06/25/18 0404 06/25/18 0409  NA 140 139  K 4.2 4.1  CL 105 104  CO2 24  --   GLUCOSE 141* 141*  BUN 13 15  CREATININE 1.08 1.00  CALCIUM 9.5  --     Lipid Panel:     Component Value Date/Time   CHOL 141 06/26/2018 0213   CHOL 75 (L) 01/23/2018 0913   TRIG 211 (H) 06/26/2018 0213   HDL 26 (L) 06/26/2018 0213   HDL 32 (L) 01/23/2018 0913   CHOLHDL 5.4 06/26/2018 0213   VLDL 42 (H) 06/26/2018 0213   LDLCALC 73 06/26/2018 0213   LDLCALC 29 01/23/2018 0913   HgbA1c:  Lab Results  Component Value Date   HGBA1C 6.9 (H) 06/26/2018   Urine Drug Screen:     Component Value Date/Time   LABOPIA NONE DETECTED 06/25/2018 0730   COCAINSCRNUR NONE DETECTED 06/25/2018 0730   LABBENZ NONE DETECTED 06/25/2018 0730   AMPHETMU NONE DETECTED 06/25/2018 0730   THCU POSITIVE (A) 06/25/2018 0730   LABBARB (A) 06/25/2018 0730    Result not available. Reagent lot number recalled by manufacturer.    Alcohol Level     Component Value Date/Time   ETH <10 06/25/2018 0404    IMAGING Ct Head Code Stroke Wo Contrast Ct Angio Head W Or Wo Contrast Ct Angio Neck W Or Wo Contrast 06/25/2018  IMPRESSION:  1. No acute hemorrhage. ASPECTS is 10.  2. Occlusion of the right posterior cerebral artery P2 segment just beyond its P1-P2 junction point.   Ct Head Wo Contrast 06/25/2018 IMPRESSION:  1. No acute intracranial abnormality.  2. 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   MRI Brain WO Contrast  06/25/2018 1. Multifocal acute ischemia within multiple vascular  territories, but greatest in the right PCA distribution. Other sites include the left thalamus, left cerebellum and right pons. The scattered distribution suggests a central embolic source. 2. Old right frontal corona radiata infarct and mildly age advanced atrophy. 3. No hemorrhage or mass effect.  Transthoracic Echocardiogram  - Left ventricle: The cavity size was normal. Wall thickness was normal. Systolic function was moderately reduced. The estimated ejection fraction was in the range of 35% to 40%. Akinesis of the mid-apicalinferolateral and apical myocardium. Features are consistent with a pseudonormal left ventricular filling pattern, with concomitant abnormal relaxation and increased filling pressure (grade 2 diastolic dysfunction). - Right ventricle: Systolic function was mildly reduced. - Right atrium: The atrium was mildly dilated.  LE Venous Doppler Negative for DVT   PHYSICAL EXAM Temp:  [97.7 F (36.5 C)-98.9 F (37.2 C)] 98 F (36.7 C) (07/15 1200) Pulse Rate:  [61-87] 61 (07/15 1400) Resp:  [14-29] 29 (07/15 1400) BP: (88-152)/(44-105) 137/82 (07/15 1400) SpO2:  [94 %-99 %] 99 % (07/15 1400) Weight:  [99.3 kg (219 lb)] 99.3 kg (219 lb) (07/15 0453)  General - Well nourished, well developed, in no apparent distress.  Ophthalmologic - fundi not visualized due to noncooperation.    Cardiovascular - Regular rate and rhythm with no murmur.  Mental Status -  Level of arousal and orientation to time, place, and person were intact. Language including expression, naming, repetition, comprehension was assessed and found intact. Fund of Knowledge was assessed and was intact.  Cranial Nerves II - XII - II - left upper quadrantanopia, chronic as per pt. III, IV, VI - Extraocular movements intact. V - Facial sensation intact bilaterally. VII - Facial movement intact bilaterally. VIII - Hearing & vestibular intact bilaterally. X - Palate elevates symmetrically. XI - Chin  turning & shoulder shrug intact bilaterally. XII - Tongue protrusion intact.  Motor Strength - The patient's strength was normal in all extremities and pronator drift was absent.  Bulk was normal and fasciculations were absent.   Motor Tone - Muscle tone was assessed at the neck and appendages and was normal.  Reflexes - The patient's reflexes were symmetrical in all extremities and he had no pathological reflexes.  Sensory - Light touch, temperature/pinprick were assessed and were symmetrical.    Coordination - The patient had normal movements in the hands with no ataxia or dysmetria.  Tremor was absent.  Gait and Station - deferred.   ASSESSMENT/PLAN [name] is a 47 y.o. male with history of DM, PVD with left toe amputations, CAD with CABG in 11/2017, anxiety, depression, diabetic neuropathy and retinopathy, DM gastroparesis, hypertension, cardiomyopathy, and CHF presenting with dizziness, ataxia, dysarthria, and difficulty ambulating. The patient received IV TPA on Sunday, 06/25/2018 at 0630.  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 likely old right corona radiata infarct.   MRI head R occipital, L thalamic, L cerebellar and R pontine infarcts. Old R CR infarct. Atrophy. R PCA flow void. L mastoid fluid  CTA H&N - Occlusion of the right PCA P1-P2 junction.   2D Echo EF 35-40%. No source of embolus   LE doppler neg DVT  TEE to look for embolic source, Tues at 4p. Arranged with [facility]   If TEE negative, a [facility] electrophysiologist will consult and consider placement of an implantable loop recorder or 30d monitor to evaluate for atrial fibrillation as etiology of stroke. This has been explained to patient/family and they are agreeable - he does report hx of heart beating fast, fluttering in  chest  LDL - 73  HgbA1c - 6.9  VTE prophylaxis - SCDs  aspirin 325 mg daily prior to admission, now on aspirin 325 mg daily and clopidogrel 75 mg daily. Continue DAPT for 3 months and then plavix alone.  Patient will be counseled to be compliant with his antithrombotic medications  Ongoing aggressive stroke risk factor management  Therapy recommendations:  No therapy needs  Disposition:  Pending  Transfer to the floor  Hypertension  Stable . Permissive hypertension (OK if < 180/105) but gradually normalize in 5-7 days . Resumed beta blocker . Long-term BP goal normotensive  Hyperlipidemia  Lipid lowering medication PTA: Crestor 40 mg daily  LDL 73, goal < 70  Current lipid lowering medication: Resume Crestor  Continue statin at discharge  Diabetes  HgbA1c 6.9, goal < 7.0  Controlled  On DM diet  on levemir 20U  SSI  CBG monitoring  Other Stroke Risk Factors  Possible old infarct by imaging  Coronary artery disease s/p CABG  Cardiomyopathy  CHF  PVD with left   toe amputation  Other Active Problems  Mild leukocytosis 11.7  DM neuropathy on neurontin  DM retinopathy  DM gastroparesis - on reglan - pt concerned about gastroparesis and being NPO for TEE for so long. States he gets really sick without food. Will discuss w/ Dr. Haylea Schlichting.  Hospital day # 1  Sharon Biby, MSN, APRN, ANVP-BC, AGPCNP-BC Advanced Practice Stroke Nurse [facility] See Amion for Schedule & Pager information 06/26/2018 3:24 PM   ATTENDING NOTE: I reviewed above note and agree with the assessment and plan. I have made any additions or clarifications directly to the above note. Pt was seen and examined.   Patient awake alert, orientated.  No acute event overnight.  Patient felt he has back to his baseline.  However, MRI showed multifocal infarcts, including left cerebellum, right medial pontine punctate infarcts, right PCA and right distal MCA small infarcts,  and left thalamic and right periventricular white matter small infarcts.  Still more embolic pattern, concerning for cardioembolic source, recommend TEE and loop recorder.  Will transfer out of ICU today, and n.p.o. after midnight for preparation of TEE tomorrow.  Will cut Levemir in half tonight.  PT/OT no recommendations.  Continue DAPT for 3 months and then Plavix alone.  Continue statin.  Jurni Cesaro, MD PhD Stroke Neurology 06/26/2018 4:06 PM     To contact Stroke Continuity provider, please refer to [email or web address]. After hours, contact General Neurology 

## 2018-06-26 NOTE — Evaluation (Signed)
l Speech Language Pathology Evaluation Patient Details Name: Travis [name] T [name] MRN: [id number] DOB: 07/16/1971 Today's Date: 06/26/2018 Time: [id number] SLP Time Calculation (min) (ACUTE ONLY): 10 min  Problem List:  Patient Active Problem List   Diagnosis Date Noted  . Ischemic stroke (HCC) 06/25/2018  . Left arm numbness 03/03/2018  . Strain of right trapezius muscle 03/03/2018  . Ischemic cardiomyopathy 12/16/2017  . Anxiety with depression 09/16/2017  . Acute on chronic systolic and diastolic heart failure, NYHA class 3 (HCC) 09/14/2017  . CAD in native artery 09/14/2017  . 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 Short Achilles tendon (acquired), left ankle 11/19/2016  . Gastroparesis due to DM (HCC)   . History of diabetic ulcer of foot 11/14/2015  . Vitamin D deficiency 11/12/2015  . Osteoporosis 11/12/2015  . GERD (gastroesophageal reflux disease) 11/03/2015  . Erectile dysfunction 07/11/2009  . Allergic rhinitis 12/11/2008  . 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 Insulin dependent diabetes mellitus with complications (HCC) 08/09/2007   Past Medical History:  Past Medical History:  Diagnosis Date  . Acute on chronic systolic and diastolic heart failure, NYHA class 3 (HCC) 09/14/2017  . Acute osteomyelitis of metatarsal bone of left foot (HCC) 11/12/2015  . CAD in native artery 09/14/2017  . Closed fracture of right tibial plateau 11/11/2015  . Depression with anxiety   . Diabetes mellitus    INSULIN DEPENDENT Type II  . Diabetic peripheral neuropathy associated with type 2 diabetes mellitus (HCC) 08/30/2008   Qualifier: Diagnosis of  By: Daphine DeutscherMartin FNP, Zena AmosNykedtra    . Diabetic ulcer of left foot (HCC) 11/14/2015  . Gastroparesis   . GERD (gastroesophageal reflux disease)   . Hypertension   . Left ventricular aneurysm   . Osteomyelitis (HCC)   . Osteoporosis 11/12/2015  . Peripheral neuropathy   . Shortness of  breath dyspnea    with exertion  . Vitamin D deficiency 11/12/2015   Past Surgical History:  Past Surgical History:  Procedure Laterality Date  . AMPUTATION Left 11/28/2015   Procedure: Left 4th Ray Amputation;  Surgeon: Nadara MustardMarcus Duda V, MD;  [facility];  Service: Orthopedics;  Laterality: Left;  . AMPUTATION Left 10/29/2016   Procedure: LEFT 5TH RAY AMPUTATION;  Surgeon: Nadara MustardMarcus V Duda, MD;  Location: [facility] OR;  Service: Orthopedics;  Laterality: Left;  . ANTERIOR VITRECTOMY Left 04/12/2016   Procedure: ANTERIOR CHAMBER WASH OUT WITH GAS FLUID EXCHANGE;  Surgeon: Carmela RimaNarendra Patel, MD;  [facility];  Service: Ophthalmology;  Laterality: Left;  . CARDIAC CATHETERIZATION    . CATARACT EXTRACTION Left   . CORONARY ARTERY BYPASS GRAFT N/A 11/29/2017    LIMA-LAD, SVG-PDA, SVG-CFX  Procedure: CORONARY ARTERY BYPASS GRAFTING (CABG) times three using the left saphaneous vien. harvested endoscopicly and left internal mammary artery.;  Surgeon: Kerin PernaVan Trigt, Peter, MD;  [facility];  Service: Open Heart Surgery;  Laterality: N/A;  . EYE SURGERY     left eye surgery 04/2016  . KNEE SURGERY Left   . RIGHT/LEFT HEART CATH AND CORONARY ANGIOGRAPHY N/A 10/19/2017   Procedure: RIGHT/LEFT HEART CATH AND CORONARY ANGIOGRAPHY;  Surgeon: SwazilandJordan, [name], MD;  [facility];  Service: Cardiovascular;  Laterality: N/A;  . SHOULDER SURGERY Right   . TEE WITHOUT CARDIOVERSION N/A 11/29/2017   Procedure: TRANSESOPHAGEAL ECHOCARDIOGRAM (TEE);  Surgeon: Donata ClayVan Trigt, Theron AristaPeter, MD;  [facility];  Service: Open Heart Surgery;  Laterality: N/A;  . TOE AMPUTATION Left  11/28/15   4th toe   HPI:  [name] is a 47 y.o. male with history of DM, PVD with left toe amputations, CAD with CABG in 11/2017, anxiety, depression, diabetic neuropathy and retinopathy, DM gastroparesis, hypertension, cardiomyopathy, and CHF presenting with dizziness, ataxia, dysarthria, and difficulty ambulating. The patient  received IV TPA on Sunday, 06/25/2018 at 0630.MRI revealed Multifocal acute ischemia within multiple vascular territories, but greatest in the right PCA distribution. Other sites include the left thalamus, left cerebellum and right pons.   Assessment / Plan / Recommendation Clinical Impression  Pt presents with clear, fluent speech; expressive/receptive language are WNL; working memory, executive functioning, and insight are WNL.  No SLP  f/u is needed - our service will sign off.     SLP Assessment  SLP Recommendation/Assessment: Patient does not need any further Speech Lanaguage Pathology Services SLP Visit Diagnosis: Cognitive communication deficit (R41.841)    Follow Up Recommendations  None    Frequency and Duration           SLP Evaluation Cognition  Overall Cognitive Status: Within Functional Limits for tasks assessed Arousal/Alertness: Awake/alert Orientation Level: Oriented X4 Attention: Alternating Alternating Attention: Appears intact Memory: Appears intact Awareness: Appears intact Problem Solving: Appears intact Executive Function: Reasoning Reasoning: Appears intact Safety/Judgment: Appears intact       Comprehension  Auditory Comprehension Overall Auditory Comprehension: Appears within functional limits for tasks assessed Visual Recognition/Discrimination Discrimination: Within Function Limits Reading Comprehension Reading Status: Not tested(baseline visual deficits)    Expression Expression Primary Mode of Expression: Verbal Verbal Expression Overall Verbal Expression: Appears within functional limits for tasks assessed Written Expression Dominant Hand: Right Written Expression: Not tested   Oral / Motor  Oral Motor/Sensory Function Overall Oral Motor/Sensory Function: Within functional limits Motor Speech Overall Motor Speech: Appears within functional limits for tasks assessed   GO                    Blenda Mounts Travis [name] 06/26/2018, 4:35  PM

## 2018-06-26 NOTE — Progress Notes (Addendum)
STROKE TEAM PROGRESS NOTE   SUBJECTIVE (INTERVAL HISTORY) Worked with therapy this am, no needs. Undergoing LE DVT during exam. No new complaints. Does want to stay in ICU because "they have the best nurses".   OBJECTIVE CBC:  Recent Labs  Lab 06/25/18 0404 06/25/18 0409  WBC 11.7*  --   NEUTROABS 7.6  --   HGB 13.9 13.9  HCT 42.2 41.0  MCV 89.4  --   PLT 249  --     Basic Metabolic Panel:  Recent Labs  Lab 06/25/18 0404 06/25/18 0409  NA 140 139  K 4.2 4.1  CL 105 104  CO2 24  --   GLUCOSE 141* 141*  BUN 13 15  CREATININE 1.08 1.00  CALCIUM 9.5  --     Lipid Panel:     Component Value Date/Time   CHOL 141 06/26/2018 0213   CHOL 75 (L) 01/23/2018 0913   TRIG 211 (H) 06/26/2018 0213   HDL 26 (L) 06/26/2018 0213   HDL 32 (L) 01/23/2018 0913   CHOLHDL 5.4 06/26/2018 0213   VLDL 42 (H) 06/26/2018 0213   LDLCALC 73 06/26/2018 0213   LDLCALC 29 01/23/2018 0913   HgbA1c:  Lab Results  Component Value Date   HGBA1C 6.9 (H) 06/26/2018   Urine Drug Screen:     Component Value Date/Time   LABOPIA NONE DETECTED 06/25/2018 0730   COCAINSCRNUR NONE DETECTED 06/25/2018 0730   LABBENZ NONE DETECTED 06/25/2018 0730   AMPHETMU NONE DETECTED 06/25/2018 0730   THCU POSITIVE (A) 06/25/2018 0730   LABBARB (A) 06/25/2018 0730    Result not available. Reagent lot number recalled by manufacturer.    Alcohol Level     Component Value Date/Time   ETH <10 06/25/2018 0404    IMAGING Ct Head Code Stroke Wo Contrast Ct Angio Head W Or Wo Contrast Ct Angio Neck W Or Wo Contrast 06/25/2018  IMPRESSION:  1. No acute hemorrhage. ASPECTS is 10.  2. Occlusion of the right posterior cerebral artery P2 segment just beyond its P1-P2 junction point.   Ct Head Wo Contrast 06/25/2018 IMPRESSION:  1. No acute intracranial abnormality.  2. 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   MRI Brain WO Contrast  06/25/2018 1. Multifocal acute ischemia within multiple vascular  territories, but greatest in the right PCA distribution. Other sites include the left thalamus, left cerebellum and right pons. The scattered distribution suggests a central embolic source. 2. Old right frontal corona radiata infarct and mildly age advanced atrophy. 3. No hemorrhage or mass effect.  Transthoracic Echocardiogram  - Left ventricle: The cavity size was normal. Wall thickness was normal. Systolic function was moderately reduced. The estimated ejection fraction was in the range of 35% to 40%. Akinesis of the mid-apicalinferolateral and apical myocardium. Features are consistent with a pseudonormal left ventricular filling pattern, with concomitant abnormal relaxation and increased filling pressure (grade 2 diastolic dysfunction). - Right ventricle: Systolic function was mildly reduced. - Right atrium: The atrium was mildly dilated.  LE Venous Doppler Negative for DVT   PHYSICAL EXAM Temp:  [97.7 F (36.5 C)-98.9 F (37.2 C)] 98 F (36.7 C) (07/15 1200) Pulse Rate:  [61-87] 61 (07/15 1400) Resp:  [14-29] 29 (07/15 1400) BP: (88-152)/(44-105) 137/82 (07/15 1400) SpO2:  [94 %-99 %] 99 % (07/15 1400) Weight:  [99.3 kg (219 lb)] 99.3 kg (219 lb) (07/15 0453)  General - Well nourished, well developed, in no apparent distress.  Ophthalmologic - fundi not visualized due to noncooperation.  Cardiovascular - Regular rate and rhythm with no murmur.  Mental Status -  Level of arousal and orientation to time, place, and person were intact. Language including expression, naming, repetition, comprehension was assessed and found intact. Fund of Knowledge was assessed and was intact.  Cranial Nerves II - XII - II - left upper quadrantanopia, chronic as per pt. III, IV, VI - Extraocular movements intact. V - Facial sensation intact bilaterally. VII - Facial movement intact bilaterally. VIII - Hearing & vestibular intact bilaterally. X - Palate elevates symmetrically. XI - Chin  turning & shoulder shrug intact bilaterally. XII - Tongue protrusion intact.  Motor Strength - The patient's strength was normal in all extremities and pronator drift was absent.  Bulk was normal and fasciculations were absent.   Motor Tone - Muscle tone was assessed at the neck and appendages and was normal.  Reflexes - The patient's reflexes were symmetrical in all extremities and he had no pathological reflexes.  Sensory - Light touch, temperature/pinprick were assessed and were symmetrical.    Coordination - The patient had normal movements in the hands with no ataxia or dysmetria.  Tremor was absent.  Gait and Station - deferred.   ASSESSMENT/PLAN Travis [name] is a 47 y.o. male with history of DM, PVD with left toe amputations, CAD with CABG in 11/2017, anxiety, depression, diabetic neuropathy and retinopathy, DM gastroparesis, hypertension, cardiomyopathy, and CHF presenting with dizziness, ataxia, dysarthria, and difficulty ambulating. The patient received IV TPA on Sunday, 06/25/2018 at 0630.  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 likely old right corona radiata infarct.   MRI head R occipital, L thalamic, L cerebellar and R pontine infarcts. Old R CR infarct. Atrophy. R PCA flow void. L mastoid fluid  CTA H&N - Occlusion of the right PCA P1-P2 junction.   2D Echo EF 35-40%. No source of embolus   LE doppler neg DVT  TEE to look for embolic source, Tues at 4p. Arranged with [facility]   If TEE negative, a [facility] electrophysiologist will consult and consider placement of an implantable loop recorder or 30d monitor to evaluate for atrial fibrillation as etiology of stroke. This has been explained to patient/family and they are agreeable - he does report hx of heart beating fast, fluttering in  chest  LDL - 73  HgbA1c - 6.9  VTE prophylaxis - SCDs  aspirin 325 mg daily prior to admission, now on aspirin 325 mg daily and clopidogrel 75 mg daily. Continue DAPT for 3 months and then plavix alone.  Patient will be counseled to be compliant with his antithrombotic medications  Ongoing aggressive stroke risk factor management  Therapy recommendations:  No therapy needs  Disposition:  Pending  Transfer to the floor  Hypertension  Stable . Permissive hypertension (OK if < 180/105) but gradually normalize in 5-7 days . Resumed beta blocker . Long-term BP goal normotensive  Hyperlipidemia  Lipid lowering medication PTA: Crestor 40 mg daily  LDL 73, goal < 70  Current lipid lowering medication: Resume Crestor  Continue statin at discharge  Diabetes  HgbA1c 6.9, goal < 7.0  Controlled  On DM diet  on levemir 20U  SSI  CBG monitoring  Other Stroke Risk Factors  Possible old infarct by imaging  Coronary artery disease s/p CABG  Cardiomyopathy  CHF  PVD with left  toe amputation  Other Active Problems  Mild leukocytosis 11.7  DM neuropathy on neurontin  DM retinopathy  DM gastroparesis - on reglan - pt concerned about gastroparesis and being NPO for TEE for so long. States he gets really sick without food. Will discuss w/ Dr. Roda Shutters.  Hospital day # 1  Annie Main, MSN, APRN, ANVP-BC, AGPCNP-BC Advanced Practice Stroke Nurse [facility] See Amion for Schedule & Pager information 06/26/2018 3:24 PM   ATTENDING NOTE: I reviewed above note and agree with the assessment and plan. I have made any additions or clarifications directly to the above note. Pt was seen and examined.   Patient awake alert, orientated.  No acute event overnight.  Patient felt he has back to his baseline.  However, MRI showed multifocal infarcts, including left cerebellum, right medial pontine punctate infarcts, right PCA and right distal MCA small infarcts,  and left thalamic and right periventricular white matter small infarcts.  Still more embolic pattern, concerning for cardioembolic source, recommend TEE and loop recorder.  Will transfer out of ICU today, and n.p.o. after midnight for preparation of TEE tomorrow.  Will cut Levemir in half tonight.  PT/OT no recommendations.  Continue DAPT for 3 months and then Plavix alone.  Continue statin.  Marvel Plan, MD PhD Stroke Neurology 06/26/2018 4:06 PM     To contact Stroke Continuity provider, please refer to [email or web address]. After hours, contact General Neurology

## 2018-06-26 NOTE — Progress Notes (Signed)
    [facility] has been requested to perform a transesophageal echocardiogram on Katha CabalMichael T Herford for CVA.  After careful review of history and examination, the risks and benefits of transesophageal echocardiogram have been explained including risks of esophageal damage, perforation (1:10,000 risk), bleeding, pharyngeal hematoma as well as other potential complications associated with conscious sedation including aspiration, arrhythmia, respiratory failure and death. Alternatives to treatment were discussed, questions were answered. Patient is willing to proceed.   Georgie ChardJill McDaniel, NP  06/26/2018 11:39 AM

## 2018-06-26 NOTE — Progress Notes (Signed)
Preliminary notes--Bilateral lower extremities venous duplex exam completed. Negative for DVT.  Hongying Maanvi Lecompte (RDMS RVT) 06/26/18 1:53 PM

## 2018-06-26 NOTE — Evaluation (Signed)
Occupational Therapy Evaluation Patient Details Name: Travis CabalMichael T [name] MRN: [id number] DOB: 09/20/1971 Today's Date: 06/26/2018    History of Present Illness [name] is a 47 y.o. male with history of DM, PVD with left toe amputations, CAD with CABG in 11/2017, anxiety, depression, diabetic neuropathy and retinopathy, DM gastroparesis, hypertension, cardiomyopathy, and CHF presenting with dizziness, ataxia, dysarthria, and difficulty ambulating. The patient received IV TPA on Sunday, 06/25/2018 at 0630.MRI revealed Multifocal acute ischemia within multiple vascular territories, but greatest in the right PCA distribution. Other sites include the left thalamus, left cerebellum and right pons.   Clinical Impression   PTA Pt independent for ADL and mobility ([facility] [place] [facility]ulation), does not do many IADL; and did require assist for buttons. Pt is currently supervision level for ADL - able to don socks EOB, and perform standing balance for ADL with RW. Pt will benefit from skilled OT in the acute setting to maximize safety and independence in ADL and transfers. Next session to focus on button aide and energy conservation. Also: Pt stated that he is at baseline for visual deficits (L visual impairment) but make sure he has paperwork on compensatory strategies.    Follow Up Recommendations  No OT follow up;Supervision - Intermittent    Equipment Recommendations  Other (comment)(AE - button aide)    Recommendations for Other Services       Precautions / Restrictions Precautions Precautions: Fall Restrictions Weight Bearing Restrictions: No      Mobility Bed Mobility Overal bed mobility: Modified Independent                Transfers Overall transfer level: Needs assistance Equipment used: Rolling walker (2 wheeled);None(initially none, then switched to RW) Transfers: Sit to/from Stand Sit to Stand: Supervision(min guard assist initially with no DME)         General transfer comment: more confident with DME    Balance Overall balance assessment: Needs assistance Sitting-balance support: No upper extremity supported;Feet supported Sitting balance-Leahy Scale: Good Sitting balance - Comments: able to demonstrate dynamic sitting balance EOB   Standing balance support: Bilateral upper extremity supported;During functional activity Standing balance-Leahy Scale: Poor Standing balance comment: able to static stand with RW for support, improved dynamic balance with RW                           ADL either performed or assessed with clinical judgement   ADL Overall ADL's : Needs assistance/impaired Eating/Feeding: Modified independent;Sitting   Grooming: Set up;Standing Grooming Details (indicate cue type and reason): leans against sink for balance Upper Body Bathing: Supervision/ safety;Sitting   Lower Body Bathing: Supervison/ safety;Sitting/lateral leans   Upper Body Dressing : Supervision/safety   Lower Body Dressing: Min guard;Sit to/from stand Lower Body Dressing Details (indicate cue type and reason): able to don socks crossing feet to knees with no LOB EOB Toilet Transfer: Min guard;Minimal assistance;Ambulation;RW Toilet Transfer Details (indicate cue type and reason): min guard assist for initial balance/stedy without RW, uses BSC over toilet at home for higher seat Toileting- Clothing Manipulation and Hygiene: Min guard;Sit to/from stand       Functional mobility during ADLs: Min guard;Rolling walker General ADL Comments: Pt stated that he has trouble with buttons and other fine motor activities recently - has to have daughter button up shirts if getting dressed up.      Vision Patient Visual Report: No change from baseline;Other (comment)(left upper quadrantanopia for 1-2  years) Vision Assessment?: Yes Eye Alignment: Within Functional Limits Ocular Range of Motion: Within Functional Limits(Pt feels like he is  at baseline with his deficits) Alignment/Gaze Preference: Within Defined Limits Tracking/Visual Pursuits: Able to track stimulus in all quads without difficulty Visual Fields: Left visual field deficit;Other (comment)(Pt reports he is at baseline - no new "blind spots")     Perception     Praxis      Pertinent Vitals/Pain Pain Assessment: No/denies pain     Hand Dominance Right   Extremity/Trunk Assessment Upper Extremity Assessment Upper Extremity Assessment: Overall WFL for tasks assessed   Lower Extremity Assessment Lower Extremity Assessment: Defer to PT evaluation   Cervical / Trunk Assessment Cervical / Trunk Assessment: Normal   Communication Communication Communication: No difficulties   Cognition Arousal/Alertness: Awake/alert Behavior During Therapy: WFL for tasks assessed/performed Overall Cognitive Status: Within Functional Limits for tasks assessed                                     General Comments  GOes by [facility]    Exercises     Shoulder Instructions      Home Living Family/patient expects to be discharged to:: Private residence Living Arrangements: Children(21, 39) Available Help at Discharge: Family;Available PRN/intermittently(both work) Type of Home: House Home Access: Stairs to enter;Ramped entrance Secretary/administrator of Steps: 4 Entrance Stairs-Rails: Right;Left;Can reach both Home Layout: One level     Bathroom Shower/Tub: Producer, television/film/video: Standard Bathroom Accessibility: Yes How Accessible: Accessible via walker Home Equipment: Walker - 2 wheels;Wheelchair - manual;Cane - single point;Shower seat;Toilet riser          Prior Functioning/Environment Level of Independence: Independent(buttoning getting harder)                 OT Problem List: Decreased activity tolerance;Impaired balance (sitting and/or standing);Decreased coordination;Decreased knowledge of use of DME or AE      OT  Treatment/Interventions: Self-care/ADL training;DME and/or AE instruction;Energy conservation;Patient/family education;Balance training;Visual/perceptual remediation/compensation    OT Goals(Current goals can be found in the care plan section) Acute Rehab OT Goals Patient Stated Goal: to get home OT Goal Formulation: With patient Time For Goal Achievement: 07/10/18 Potential to Achieve Goals: Good ADL Goals Pt Will Perform Grooming: with modified independence;standing Pt Will Perform Upper Body Dressing: with modified independence;with adaptive equipment;sitting Pt Will Perform Lower Body Dressing: with modified independence;sit to/from stand Pt Will Transfer to Toilet: with modified independence;ambulating Pt Will Perform Toileting - Clothing Manipulation and hygiene: with modified independence;sit to/from stand Additional ADL Goal #1: Pt will recall 3 ways of conserving energy during ADL at independent level  OT Frequency: Min 2X/week   Barriers to D/C:            Co-evaluation PT/OT/SLP Co-Evaluation/Treatment: Yes Reason for Co-Treatment: For patient/therapist safety;To address functional/ADL transfers PT goals addressed during session: Mobility/safety with mobility;Balance;Proper use of DME OT goals addressed during session: ADL's and self-care;Strengthening/ROM;Proper use of Adaptive equipment and DME      AM-PAC PT "6 Clicks" Daily Activity     Outcome Measure Help from another person eating meals?: None Help from another person taking care of personal grooming?: A Little Help from another person toileting, which includes using toliet, bedpan, or urinal?: A Little Help from another person bathing (including washing, rinsing, drying)?: A Little Help from another person to put on and taking off regular upper body clothing?: None  Help from another person to put on and taking off regular lower body clothing?: None 6 Click Score: 21   End of Session Equipment Utilized During  Treatment: Gait belt;Rolling walker Nurse Communication: Mobility status  Activity Tolerance: Patient tolerated treatment well Patient left: in chair;with call bell/phone within reach;with chair alarm set  OT Visit Diagnosis: Unsteadiness on feet (R26.81);Muscle weakness (generalized) (M62.81)                Time: 9604-5409 OT Time Calculation (min): 32 min Charges:  OT General Charges $OT Visit: 1 Visit OT Evaluation $OT Eval Moderate Complexity: 1 Mod G-Codes:     July 05, 2018 Sherryl Manges OTR/L [phone]  Travis [name] 07-05-2018, 11:00 AM

## 2018-06-26 NOTE — Evaluation (Signed)
Physical Therapy Evaluation Patient Details Name: Travis [name] MRN: [id number] DOB: [birth year] Today's Date: 06/26/2018   History of Present Illness  [name] is a 47 y.o. male with history of DM, PVD with left toe amputations, CAD with CABG in 11/2017, anxiety, depression, diabetic neuropathy and retinopathy, DM gastroparesis, hypertension, cardiomyopathy, and CHF presenting with dizziness, ataxia, dysarthria, and difficulty ambulating. The patient received IV TPA on Sunday, 06/25/2018 at 0630.MRI revealed Multifocal acute ischemia within multiple vascular territories, but greatest in the right PCA distribution. Other sites include the left thalamus, left cerebellum and right pons.  Clinical Impression  Patient presents with mild imbalance and decreased endurance from baseline after bedrest for few days.  States has some imbalance at baseline due to neuropathy using cane on unlevel surfaces.  Feel he should return to baseline with mobilization and likely will not need follow up PT after d/c.  PT to follow acutely.    Follow Up Recommendations No PT follow up    Equipment Recommendations  None recommended by PT    Recommendations for Other Services       Precautions / Restrictions Precautions Precautions: Fall Precaution Comments: BP <180/105 Restrictions Weight Bearing Restrictions: No      Mobility  Bed Mobility Overal bed mobility: Modified Independent                Transfers Overall transfer level: Needs assistance Equipment used: None Transfers: Sit to/from Stand Sit to Stand: Supervision         General transfer comment: no AD to stand, felt he needed it to walk  Ambulation/Gait Ambulation/Gait assistance: Supervision Gait Distance (Feet): 200 Feet Assistive device: Rolling walker (2 wheeled) Gait Pattern/deviations: Step-through pattern;Decreased stride length     General Gait Details: initially feeling unsteady without AD, with walker  able to feel confident and walked around unit  Stairs            Wheelchair Mobility    Modified Rankin (Stroke Patients Only) Modified Rankin (Stroke Patients Only) Pre-Morbid Rankin Score: No significant disability Modified Rankin: Moderately severe disability     Balance Overall balance assessment: Needs assistance Sitting-balance support: No upper extremity supported;Feet supported Sitting balance-Leahy Scale: Good Sitting balance - Comments: able to demonstrate dynamic sitting balance EOB   Standing balance support: Bilateral upper extremity supported;During functional activity Standing balance-Leahy Scale: Fair Standing balance comment: static standing without UE support, walker for ambulation                             Pertinent Vitals/Pain Pain Assessment: No/denies pain    Home Living Family/patient expects to be discharged to:: Private residence Living Arrangements: Children(21, 68) Available Help at Discharge: Family;Available PRN/intermittently Type of Home: House Home Access: Stairs to enter;Ramped entrance Entrance Stairs-Rails: Right;Left;Can reach both Entrance Stairs-Number of Steps: 4 Home Layout: One level Home Equipment: Walker - 2 wheels;Wheelchair - manual;Cane - single point;Shower seat;Toilet riser      Prior Function Level of Independence: Independent               Hand Dominance   Dominant Hand: Right    Extremity/Trunk Assessment   Upper Extremity Assessment Upper Extremity Assessment: Defer to OT evaluation    Lower Extremity Assessment Lower Extremity Assessment: LLE deficits/detail;RLE deficits/detail RLE Deficits / Details: limited ankle DF due to neuropathy RLE Sensation: history of peripheral neuropathy(numbness to knees) LLE Deficits / Details: previous toe amputations with foot changes;  limited ankle DF due to neuropathy LLE Sensation: history of peripheral neuropathy(numbness to knees)    Cervical  / Trunk Assessment Cervical / Trunk Assessment: Normal  Communication   Communication: No difficulties  Cognition Arousal/Alertness: Awake/alert Behavior During Therapy: WFL for tasks assessed/performed Overall Cognitive Status: Within Functional Limits for tasks assessed                                        General Comments General comments (skin integrity, edema, etc.): GOes by [employer]    Exercises     Assessment/Plan    PT Assessment Patient needs continued PT services  PT Problem List Decreased mobility;Decreased balance;Decreased knowledge of use of DME       PT Treatment Interventions DME instruction;Therapeutic activities;Gait training;Therapeutic exercise;Patient/family education;Functional mobility training;Stair training    PT Goals (Current goals can be found in the Care Plan section)  Acute Rehab PT Goals Patient Stated Goal: to get home PT Goal Formulation: With patient Time For Goal Achievement: 07/03/18 Potential to Achieve Goals: Good    Frequency Min 4X/week   Barriers to discharge        Co-evaluation PT/OT/SLP Co-Evaluation/Treatment: Yes Reason for Co-Treatment: For patient/therapist safety;To address functional/ADL transfers PT goals addressed during session: Mobility/safety with mobility;Balance;Proper use of DME OT goals addressed during session: ADL's and self-care;Strengthening/ROM;Proper use of Adaptive equipment and DME       AM-PAC PT "6 Clicks" Daily Activity  Outcome Measure Difficulty turning over in bed (including adjusting bedclothes, sheets and blankets)?: A Little Difficulty moving from lying on back to sitting on the side of the bed? : A Little Difficulty sitting down on and standing up from a chair with arms (e.g., wheelchair, bedside commode, etc,.)?: A Lot Help needed moving to and from a bed to chair (including a wheelchair)?: A Little Help needed walking in hospital room?: A Little Help needed climbing 3-5  steps with a railing? : A Lot 6 Click Score: 16    End of Session Equipment Utilized During Treatment: Gait belt Activity Tolerance: Patient tolerated treatment well Patient left: with call bell/phone within reach;in chair;with chair alarm set   PT Visit Diagnosis: Other abnormalities of gait and mobility (R26.89)    Time: [id number] PT Time Calculation (min) (ACUTE ONLY): 18 min   Charges:   PT Evaluation $PT Eval Low Complexity: 1 Low     PT G CodesSheran Lawless:        [place], [place] [phone] 06/26/2018   Elray Mcgregorynthia Jia Dottavio 06/26/2018, 1:29 PM

## 2018-06-26 NOTE — Progress Notes (Signed)
Patient admitted from ICU with no apparent distress noted. Vital signs stable, denied any pain/discomfort.

## 2018-06-27 ENCOUNTER — Telehealth: Payer: Self-pay | Admitting: Family Medicine

## 2018-06-27 ENCOUNTER — Other Ambulatory Visit (HOSPITAL_COMMUNITY): Payer: Medicare Other

## 2018-06-27 DIAGNOSIS — E785 Hyperlipidemia, unspecified: Secondary | ICD-10-CM

## 2018-06-27 DIAGNOSIS — Z89432 Acquired absence of left foot: Secondary | ICD-10-CM

## 2018-06-27 DIAGNOSIS — I10 Essential (primary) hypertension: Secondary | ICD-10-CM

## 2018-06-27 DIAGNOSIS — E118 Type 2 diabetes mellitus with unspecified complications: Secondary | ICD-10-CM

## 2018-06-27 DIAGNOSIS — D72829 Elevated white blood cell count, unspecified: Secondary | ICD-10-CM

## 2018-06-27 DIAGNOSIS — I2510 Atherosclerotic heart disease of native coronary artery without angina pectoris: Secondary | ICD-10-CM

## 2018-06-27 DIAGNOSIS — I255 Ischemic cardiomyopathy: Secondary | ICD-10-CM

## 2018-06-27 DIAGNOSIS — E11319 Type 2 diabetes mellitus with unspecified diabetic retinopathy without macular edema: Secondary | ICD-10-CM

## 2018-06-27 DIAGNOSIS — E1143 Type 2 diabetes mellitus with diabetic autonomic (poly)neuropathy: Secondary | ICD-10-CM

## 2018-06-27 LAB — GLUCOSE, CAPILLARY
Glucose-Capillary: 136 mg/dL — ABNORMAL HIGH (ref 70–99)
Glucose-Capillary: 192 mg/dL — ABNORMAL HIGH (ref 70–99)

## 2018-06-27 MED ORDER — CLOPIDOGREL BISULFATE 75 MG PO TABS: 75.0000 mg | ORAL_TABLET | Freq: Every day | ORAL | 2 refills | Status: DC

## 2018-06-27 MED ORDER — INSULIN DETEMIR 100 UNIT/ML ~~LOC~~ SOLN: 20.0000 [IU] | Freq: Every day | SUBCUTANEOUS | 3 refills | Status: DC

## 2018-06-27 NOTE — Telephone Encounter (Signed)
   Primary Cardiologist: Chilton Siiffany Payson, MD  Chart reviewed as part of pre-operative protocol coverage. [name] underwent nuclear stress test in preparation for surgery and this was stable from from previous. Unfortunately he has just had a hospital admission for stroke and is now on aspirin and Plavix per neurology. He will likely need to postpone his surgery in the acute phase. Please check with neurology regarding his antiplatelet therapy.   Please request cardiac clearance again when considered again for surgery, when OK per neurology.    Travis BonJanine Aldon Hengst, NP 06/27/2018, 11:33 AM

## 2018-06-27 NOTE — Discharge Summary (Addendum)
Stroke Discharge Summary  Patient ID: Travis [name]   MRN: [id number]      DOB: [birth year]  Date of Admission: 06/25/2018 Date of Discharge: 06/27/2018  Attending Physician:  Marvel Plan, MD, Stroke MD Consultant(s):   Lbcardiology, Rounding, MD, Ladona Mow, WOC  Patient's PCP:  Wendee Beavers, DO  DISCHARGE DIAGNOSIS:  Principal Problem:   Ischemic stroke (HCC) - mult small B infarcts s/p tPA, unknown embolic source Active Problems:   Insulin dependent diabetes mellitus with complications (HCC)   Diabetic peripheral neuropathy associated with type 2 diabetes mellitus (HCC)   GERD (gastroesophageal reflux disease)   Osteoporosis   Gastroparesis due to DM (HCC)   Foot amputation status, left (HCC)   Acute on chronic systolic and diastolic heart failure, NYHA class 3 (HCC)   CAD in native artery   Anxiety with depression   Ischemic cardiomyopathy   Essential hypertension   Hyperlipidemia   Diabetic retinopathy (HCC)   Leukocytosis   Past Medical History:  Diagnosis Date  . Acute on chronic systolic and diastolic heart failure, NYHA class 3 (HCC) 09/14/2017  . Acute osteomyelitis of metatarsal bone of left foot (HCC) 11/12/2015  . CAD in native artery 09/14/2017  . Closed fracture of right tibial plateau 11/11/2015  . Depression with anxiety   . Diabetes mellitus    INSULIN DEPENDENT Type II  . Diabetic peripheral neuropathy associated with type 2 diabetes mellitus (HCC) 08/30/2008   Qualifier: Diagnosis of  By: Daphine Deutscher FNP, Zena Amos    . Diabetic ulcer of left foot (HCC) 11/14/2015  . Gastroparesis   . GERD (gastroesophageal reflux disease)   . Hypertension   . Left ventricular aneurysm   . Osteomyelitis (HCC)   . Osteoporosis 11/12/2015  . Peripheral neuropathy   . Shortness of breath dyspnea    with exertion  . Vitamin D deficiency 11/12/2015   Past Surgical History:  Procedure Laterality Date  . AMPUTATION Left 11/28/2015   Procedure: Left 4th Ray  Amputation;  Surgeon: Nadara Mustard, MD;  Location: [facility] OR;  Service: Orthopedics;  Laterality: Left;  . AMPUTATION Left 10/29/2016   Procedure: LEFT 5TH RAY AMPUTATION;  Surgeon: Nadara Mustard, MD;  Location: [facility] OR;  Service: Orthopedics;  Laterality: Left;  . ANTERIOR VITRECTOMY Left 04/12/2016   Procedure: ANTERIOR CHAMBER WASH OUT WITH GAS FLUID EXCHANGE;  Surgeon: Carmela Rima, MD;  Location: [facility] OR;  Service: Ophthalmology;  Laterality: Left;  . CARDIAC CATHETERIZATION    . CATARACT EXTRACTION Left   . CORONARY ARTERY BYPASS GRAFT N/A 11/29/2017    LIMA-LAD, SVG-PDA, SVG-CFX  Procedure: CORONARY ARTERY BYPASS GRAFTING (CABG) times three using the left saphaneous vien. harvested endoscopicly and left internal mammary artery.;  Surgeon: Kerin Perna, MD;  Location[facility] OR;  Service: Open Heart Surgery;  Laterality: N/A;  . EYE SURGERY     left eye surgery 04/2016  . KNEE SURGERY Left   . RIGHT/LEFT HEART CATH AND CORONARY ANGIOGRAPHY N/A 10/19/2017   Procedure: RIGHT/LEFT HEART CATH AND CORONARY ANGIOGRAPHY;  Surgeon: [place]azil[place], [name], MD;  Location: [facility] INVASIVE CV LAB;  Service: Cardiovascular;  Laterality: N/A;  . SHOULDER SURGERY Right   . TEE WITHOUT CARDIOVERSION N/A 11/29/2017   Procedure: TRANSESOPHAGEAL ECHOCARDIOGRAM (TEE);  Surgeon: Donata Clay, Theron Arista, MD;  Location: [facility] OR;  Service: Open Heart Surgery;  Laterality: N/A;  . TOE AMPUTATION Left 11/28/15   4th toe    Allergies as of 06/27/2018   No Known  Allergies     Medication List    STOP taking these medications   losartan 25 MG tablet Commonly known as:  COZAAR     TAKE these medications   aspirin 325 MG EC tablet Take 1 tablet (325 mg total) by mouth daily.   clopidogrel 75 MG tablet Commonly known as:  PLAVIX Take 1 tablet (75 mg total) by mouth daily. Start taking on:  06/28/2018   gabapentin 800 MG tablet Commonly known as:  NEURONTIN TAKE 1 TABLET(800 MG) BY MOUTH THREE TIMES DAILY   insulin detemir 100  UNIT/ML injection Commonly known as:  LEVEMIR Inject 0.2 mLs (20 Units total) into the skin at bedtime. Take 1/2 of normal dose tonight 06/27/18 for planned procedure tomorrow. Then resume normal dose What changed:  additional instructions   Iron 325 (65 Fe) MG Tabs Take 1 tablet (325 mg total) by mouth 2 (two) times daily after a meal.   metoCLOPramide 10 MG tablet Commonly known as:  REGLAN Take 1 tablet (10 mg total) by mouth 4 (four) times daily -  before meals and at bedtime.   metoprolol succinate 25 MG 24 hr tablet Commonly known as:  TOPROL XL Take 0.5 tablets (12.5 mg total) by mouth daily.   ranitidine 150 MG tablet Commonly known as:  ZANTAC Take 1 tablet 2 (two) times daily by mouth.   rosuvastatin 40 MG tablet Commonly known as:  CRESTOR Take 1 tablet (40 mg total) by mouth daily.       LABORATORY STUDIES CBC    Component Value Date/Time   WBC 11.7 (H) 06/25/2018 0404   RBC 4.72 06/25/2018 0404   HGB 13.9 06/25/2018 0409   HGB 12.4 (L) 10/13/2017 1016   HCT 41.0 06/25/2018 0409   HCT 36.4 (L) 10/13/2017 1016   PLT 249 06/25/2018 0404   PLT 280 10/13/2017 1016   MCV 89.4 06/25/2018 0404   MCV 88 10/13/2017 1016   MCH 29.4 06/25/2018 0404   MCHC 32.9 06/25/2018 0404   RDW 13.9 06/25/2018 0404   RDW 13.2 10/13/2017 1016   LYMPHSABS 2.9 06/25/2018 0404   LYMPHSABS 2.1 10/13/2017 1016   MONOABS 0.7 06/25/2018 0404   EOSABS 0.3 06/25/2018 0404   EOSABS 0.3 10/13/2017 1016   BASOSABS 0.1 06/25/2018 0404   BASOSABS 0.0 10/13/2017 1016   CMP    Component Value Date/Time   NA 139 06/25/2018 0409   NA 137 01/23/2018 0913   K 4.1 06/25/2018 0409   CL 104 06/25/2018 0409   CO2 24 06/25/2018 0404   GLUCOSE 141 (H) 06/25/2018 0409   BUN 15 06/25/2018 0409   BUN 13 01/23/2018 0913   CREATININE 1.00 06/25/2018 0409   CREATININE 0.81 04/30/2016 1338   CALCIUM 9.5 06/25/2018 0404   CALCIUM 9.0 11/11/2015 1025   PROT 7.4 06/25/2018 0404   PROT 6.2  01/23/2018 0913   ALBUMIN 4.0 06/25/2018 0404   ALBUMIN 3.9 01/23/2018 0913   AST 22 06/25/2018 0404   ALT 16 06/25/2018 0404   ALKPHOS 64 06/25/2018 0404   BILITOT 1.0 06/25/2018 0404   BILITOT 0.5 01/23/2018 0913   GFRNONAA >60 06/25/2018 0404   GFRNONAA >89 04/30/2016 1338   GFRAA >60 06/25/2018 0404   GFRAA >89 04/30/2016 1338   COAGS Lab Results  Component Value Date   INR 1.12 06/25/2018   INR 1.47 11/29/2017   INR 1.06 11/28/2017   Lipid Panel    Component Value Date/Time   CHOL 141 06/26/2018 0213  CHOL 75 (L) 01/23/2018 0913   TRIG 211 (H) 06/26/2018 0213   HDL 26 (L) 06/26/2018 0213   HDL 32 (L) 01/23/2018 0913   CHOLHDL 5.4 06/26/2018 0213   VLDL 42 (H) 06/26/2018 0213   LDLCALC 73 06/26/2018 0213   LDLCALC 29 01/23/2018 0913   HgbA1C  Lab Results  Component Value Date   HGBA1C 6.9 (H) 06/26/2018   Urinalysis    Component Value Date/Time   COLORURINE YELLOW 06/25/2018 0730   APPEARANCEUR CLEAR 06/25/2018 0730   LABSPEC 1.031 (H) 06/25/2018 0730   PHURINE 5.0 06/25/2018 0730   GLUCOSEU 150 (A) 06/25/2018 0730   HGBUR SMALL (A) 06/25/2018 0730   HGBUR negative 09/17/2009 0834   BILIRUBINUR NEGATIVE 06/25/2018 0730   KETONESUR 20 (A) 06/25/2018 0730   PROTEINUR 30 (A) 06/25/2018 0730   UROBILINOGEN 1.0 04/30/2016 1338   NITRITE NEGATIVE 06/25/2018 0730   LEUKOCYTESUR NEGATIVE 06/25/2018 0730   Urine Drug Screen     Component Value Date/Time   LABOPIA NONE DETECTED 06/25/2018 0730   COCAINSCRNUR NONE DETECTED 06/25/2018 0730   LABBENZ NONE DETECTED 06/25/2018 0730   AMPHETMU NONE DETECTED 06/25/2018 0730   THCU POSITIVE (A) 06/25/2018 0730   LABBARB (A) 06/25/2018 0730    Result not available. Reagent lot number recalled by manufacturer.    Alcohol Level    Component Value Date/Time   ETH <10 06/25/2018 0404     SIGNIFICANT DIAGNOSTIC STUDIES Ct Head Code Stroke Wo Contrast Ct Angio Head W Or Wo Contrast Ct Angio Neck W Or Wo  Contrast 06/25/2018  IMPRESSION:  1. No acute hemorrhage. ASPECTS is 10.  2. Occlusion of the right posterior cerebral artery P2 segment just beyond its P1-P2 junction point.   Ct Head Wo Contrast 06/25/2018 IMPRESSION:  1. No acute intracranial abnormality.  2. 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   MRI Brain WO Contrast  06/25/2018 1. Multifocal acute ischemia within multiple vascular territories, but greatest in the right PCA distribution. Other sites include the left thalamus, left cerebellum and right pons. The scattered distribution suggests a central embolic source. 2. Old right frontal corona radiata infarct and mildly age advanced atrophy. 3. No hemorrhage or mass effect.  Transthoracic Echocardiogram  - Left ventricle: The cavity size was normal. Wall thickness wasnormal. Systolic function was moderately reduced. The estimatedejection fraction was in the range of 35% to 40%. Akinesis of themid-apicalinferolateral and apical myocardium. Features areconsistent with a pseudonormal left ventricular filling pattern,with concomitant abnormal relaxation and increased fillingpressure (grade 2 diastolic dysfunction). - Right ventricle: Systolic function was mildly reduced. - Right atrium: The atrium was mildly dilated.  LE Venous Doppler Negative for DVT  TEE pending (scheduled for 06/28/2018 as an OP at 0900)     HISTORY OF PRESENT ILLNESS Travis [name] is an 47 y.o. male with past medical history of coronary artery disease status post CABG, chronic systolic and diastolic heart failure, diabetes mellitus, peripheral neuropathy, hypertension presents to the emergency department after having sudden onset difficulty walking and feeling lightheaded around  2 AM the morning of 06/25/2018 (LKW 0155 on 06/25/18).    The patient was awake at the time and suddenly noticed that he had trouble getting around and felt dizzy.  EMS was called and at the time they noticed may be  mild left-sided weakness and slurred speech.  However that improved and when he was assessed by ED physician, patient was almost back to baseline with no neurological deficits per EDP.  The  family however feel he still had some mild slurred speech that did not resolve since 2 AM. NIHSS: 7. Baseline MRS 0.   Neuro was consulted at 6 AM and was told the patient started to have mild slurred speech again.  Upon assessment of the patient, he had ataxia in all 4 extremities with drift in left arm and left leg.  Appeared to have mild visual field deficits in the left eye which he had said was chronic, however was also unable to move his left eye towards the right side appeared to have an INO. NIHSS was 7 and as he was still within the window, TPA was offered after explaining risk versus benefit the patient.  Agreed to receive TPA.  Stat CTA  of his head was performed demonstrated a P2 occlusion. He was admitted to the neuro ICU.     HOSPITAL COURSE Travis [name] is a 47 y.o. male with history of DM, PVD with left toe amputations, CAD with CABG in 11/2017, anxiety, depression, diabetic neuropathy and retinopathy, DM gastroparesis, hypertension, cardiomyopathy, and CHF presenting with dizziness, ataxia, dysarthria, and difficulty ambulating. Following neuro worsening, the patient received IV TPA on Sunday, 06/25/2018 at 0630. He made full recovery, without needs for therapy follow up. Etiology of multiple bilateral infarcts were felt to be embolic without source found. Needs TEE and loop recorder to complete workup. Pt with high levels of anxiety related to staying in the hospital for early am TEE (has GI issues when he has to be NPO too long). Prefers d/c home with OP TEE and loop placement. Discussed with he and sister.  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 likely old right corona  radiata infarct.   MRI head R occipital, L thalamic, L cerebellar and R pontine infarcts. Old R CR infarct. Atrophy. R PCA flow void. L mastoid fluid  CTA H&N - Occlusion of the right PCA P1-P2 junction.   2D Echo EF 35-40%. No source of embolus   LE doppler neg DVT  TEE to look for embolic source, Wed at 900 as an OP. Arranged with [facility]. To return to hospital at 0745 to check in - NPO after MN, take 1/2 insulin dose (long discussion with pt and sister to ensure understanding decision and subsequent plan)  If TEE negative, a [facility] electrophysiologist will consider placement of an implantable loop recorder or 30d monitor to evaluate for atrial fibrillation as etiology of stroke. This has been explained to patient and he is agreeable - he does report hx of heart beating fast, fluttering in chest  LDL - 73  HgbA1c - 6.9  aspirin 325 mg daily prior to admission, now on aspirin 325 mg daily and clopidogrel 75 mg daily. Continue DAPT for 3 months and then plavix alone.  Patient counseled to be compliant with his antithrombotic medications  Therapy recommendations:  No therapy needs  Disposition:  home  Hypertension  Stable  Resumed beta blocker  Was on cozaar, not resumed in hospital/at d/c d/t BP 100-120s  Instructed pt to follow BP  Follow up with PCP to resume as needed  Long-term BP goal normotensive  Hyperlipidemia  Lipid lowering medication PTA: Crestor 40 mg daily  LDL 73, goal < 70  Current lipid lowering medication: Resume Crestor  Continue statin at discharge  Diabetes type II  HgbA1c 6.9, goal < 7.0  on levemir 20U  Take half dose tonight for TEE tomorrow, the resume normal dose  Other Stroke Risk Factors  Possible old infarct by imaging  Coronary artery disease s/p CABG  Cardiomyopathy  CHF  PVD with left toe amputation  Other Active Problems  Mild leukocytosis 11.7  DM  neuropathy on neurontin  DM retinopathy  DM gastroparesis - on reglan    DISCHARGE EXAM Blood pressure 114/75, pulse 69, temperature 98.2 F (36.8 C), temperature source Oral, resp. rate 16, height 6\' 2"  (1.88 m), weight 99.7 kg (219 lb 12.8 oz), SpO2 96 %. General - Well nourished, well developed, anxious, alternating sweat and chills. afebrile  Cardiovascular - Regular rate and rhythm with no murmur.  Mental Status -  Level of arousal and orientation to time, place, and person were intact. Language including expression, naming, repetition, comprehension was assessed and found intact. Fund of Knowledge was assessed and was intact.  Cranial Nerves II - XII - II - left upper quadrantanopia, chronic as per pt. III, IV, VI - Extraocular movements intact. V - Facial sensation intact bilaterally. VII - Facial movement intact bilaterally. VIII - Hearing & vestibular intact bilaterally. X - Palate elevates symmetrically. XI - Chin turning & shoulder shrug intact bilaterally. XII - Tongue protrusion intact.  Motor Strength - The patient's strength was normal in all extremities and pronator drift was absent.  Bulk was normal and fasciculations were absent.   Motor Tone - Muscle tone was assessed at the neck and appendages and was normal.  Reflexes - The patient's reflexes were symmetrical in all extremities and he had no pathological reflexes.  Sensory - Light touch, temperature/pinprick were assessed and were symmetrical.    Coordination - The patient had normal movements in the hands with no ataxia or dysmetria.  Tremor was absent.  Gait and Station - stable.   Discharge Diet   Carb Modified with thin liquids; NPO at MN 7/17 for OP TEE at 0900  DISCHARGE PLAN  Disposition:  home  aspirin 325 mg daily and clopidogrel 75 mg daily for secondary stroke prevention x 3 months the plavix alone  OP TEE 06/28/2018 at 0900. NPO after MN  Ongoing risk factor control by Primary  Care Physician at time of discharge  Follow-up Wendee BeaversMcMullen, David J, DO in 2 weeks.  Follow-up in [facility] in 4 weeks, office to schedule an appointment.   40 minutes were spent preparing discharge.  Annie MainSharon Biby, MSN, APRN, ANVP-BC, AGPCNP-BC Advanced Practice Stroke Nurse [facility] See Amion for Schedule & Pager information 06/27/2018 1:31 PM    ATTENDING NOTE: I reviewed above note and agree with the assessment and plan. I have made any additions or clarifications directly to the above note. Pt was seen and examined.   No acute event overnight. Pt doing well at baseline. Scheduled TEE today at 3pm but was able to do it at 11am. However, pt declined as he just wanted to eat. Rescheduled to 06/28/18 9am but he would like to be discharged and do it as outpt. Will arrange. Pt is ready for discharge. [facility].  Marvel PlanJindong Anjalina Bergevin, MD PhD Stroke Neurology 06/28/2018 11:39 AM

## 2018-06-27 NOTE — Progress Notes (Signed)
Pt preceded to eat breakfast; after being told several of times that he was having his TEE done today at 4:00pm, and needed to be NPO. Pt so far has had a muffin and states that his aunt is bringing him a biscuit breakfast. RN informed pt that this test was needed due to his stroke, and eating would delay his care; and that he needed to remain NPO. PT states he fears feeling sick without eating. PT is not currently sick or nausea and CBG this moring was 136.RN will inform MD.

## 2018-06-27 NOTE — Progress Notes (Signed)
Physical Therapy Treatment Patient Details Name: Travis CabalMichael T Stormont MRN: [id number] DOB: 12/28/1970 Today's Date: 06/27/2018    History of Present Illness Mr. Travis CabalMichael T Clover is a 47 y.o. male with history of DM, PVD with left toe amputations, CAD with CABG in 11/2017, anxiety, depression, diabetic neuropathy and retinopathy, DM gastroparesis, hypertension, cardiomyopathy, and CHF presenting with dizziness, ataxia, dysarthria, and difficulty ambulating. The patient received IV TPA on Sunday, 06/25/2018 at 0630.MRI revealed Multifocal acute ischemia within multiple vascular territories, but greatest in the right PCA distribution. Other sites include the left thalamus, left cerebellum and right pons.    PT Comments    Patient is making progress toward PT goals. Pt overall required supervision/min guard for transfers and gait using SPC. Continue to progress as tolerated.    Follow Up Recommendations  No PT follow up     Equipment Recommendations  None recommended by PT    Recommendations for Other Services       Precautions / Restrictions Precautions Precautions: Fall Precaution Comments: BP <180/105 Restrictions Weight Bearing Restrictions: No    Mobility  Bed Mobility Overal bed mobility: Modified Independent                Transfers Overall transfer level: Needs assistance Equipment used: None Transfers: Sit to/from Stand Sit to Stand: Supervision         General transfer comment: supervision for safety without AD  Ambulation/Gait Ambulation/Gait assistance: Supervision;Min guard Gait Distance (Feet): 300 Feet Assistive device: Straight cane Gait Pattern/deviations: Step-through pattern;Decreased stride length Gait velocity: decreased   General Gait Details: cautious gait with mild unsteadiness using SPC; no overt LOB; pt unable to tolerate challenges to gait like head turns however pt reports that is baseline   Stairs Stairs: Yes Stairs assistance:  Supervision Stair Management: Two rails;Step to pattern;Forwards Number of Stairs: 2 General stair comments: supervision for safety   Wheelchair Mobility    Modified Rankin (Stroke Patients Only) Modified Rankin (Stroke Patients Only) Pre-Morbid Rankin Score: No significant disability Modified Rankin: Moderately severe disability     Balance Overall balance assessment: Needs assistance Sitting-balance support: No upper extremity supported;Feet supported Sitting balance-Leahy Scale: Good     Standing balance support: During functional activity;Single extremity supported Standing balance-Leahy Scale: Fair                              Cognition Arousal/Alertness: Awake/alert Behavior During Therapy: WFL for tasks assessed/performed Overall Cognitive Status: Within Functional Limits for tasks assessed                                        Exercises      General Comments        Pertinent Vitals/Pain Pain Assessment: No/denies pain    Home Living                      Prior Function            PT Goals (current goals can now be found in the care plan section) Acute Rehab PT Goals Patient Stated Goal: to get home PT Goal Formulation: With patient Time For Goal Achievement: 07/03/18 Potential to Achieve Goals: Good Progress towards PT goals: Progressing toward goals    Frequency    Min 4X/week      PT Plan Current plan remains appropriate  Co-evaluation              AM-PAC PT "6 Clicks" Daily Activity  Outcome Measure  Difficulty turning over in bed (including adjusting bedclothes, sheets and blankets)?: A Little Difficulty moving from lying on back to sitting on the side of the bed? : A Little Difficulty sitting down on and standing up from a chair with arms (e.g., wheelchair, bedside commode, etc,.)?: A Lot Help needed moving to and from a bed to chair (including a wheelchair)?: A Little Help needed  walking in hospital room?: A Little Help needed climbing 3-5 steps with a railing? : A Little 6 Click Score: 17    End of Session Equipment Utilized During Treatment: Gait belt Activity Tolerance: Patient tolerated treatment well Patient left: with call bell/phone within reach;in chair Nurse Communication: Mobility status PT Visit Diagnosis: Other abnormalities of gait and mobility (R26.89)     Time: 1610-9604 PT Time Calculation (min) (ACUTE ONLY): 17 min  Charges:  $Gait Training: 8-22 mins                    G Codes:       Erline Levine, PTA Pager: [phone]     Carolynne Edouard 06/27/2018, 12:58 PM

## 2018-06-27 NOTE — Telephone Encounter (Signed)
Pt had a stroke on Sunday. He is in [facility].  He would like to have his regular dr come by to see him.  He is very frustrated about the care he is receiving like his lack of medication and food service.

## 2018-06-27 NOTE — Progress Notes (Signed)
Pt being discharged home. Discharge instructions were reviewed with pt and sister. Pt verbalized understanding.

## 2018-06-28 ENCOUNTER — Ambulatory Visit (HOSPITAL_COMMUNITY): Admit: 2018-06-28 | Payer: Medicare Other | Admitting: Internal Medicine

## 2018-06-28 ENCOUNTER — Other Ambulatory Visit: Payer: Self-pay

## 2018-06-28 ENCOUNTER — Encounter (HOSPITAL_COMMUNITY)
Admission: RE | Disposition: A | Discharge status: Home / Self Care | Payer: Self-pay | Source: Ambulatory Visit | Attending: Cardiology

## 2018-06-28 ENCOUNTER — Ambulatory Visit (HOSPITAL_COMMUNITY)
Admission: RE | Admit: 2018-06-28 | Discharge: 2018-06-28 | Disposition: A | Discharge status: Home / Self Care | Payer: Medicare Other | Source: Ambulatory Visit | Attending: Cardiology | Admitting: Cardiology

## 2018-06-28 ENCOUNTER — Encounter (HOSPITAL_COMMUNITY): Payer: Self-pay | Admitting: Internal Medicine

## 2018-06-28 ENCOUNTER — Ambulatory Visit (HOSPITAL_COMMUNITY): Payer: Medicaid Other

## 2018-06-28 ENCOUNTER — Ambulatory Visit (HOSPITAL_BASED_OUTPATIENT_CLINIC_OR_DEPARTMENT_OTHER): Payer: Medicare Other

## 2018-06-28 DIAGNOSIS — K3184 Gastroparesis: Secondary | ICD-10-CM | POA: Diagnosis not present

## 2018-06-28 DIAGNOSIS — I340 Nonrheumatic mitral (valve) insufficiency: Secondary | ICD-10-CM | POA: Diagnosis not present

## 2018-06-28 DIAGNOSIS — Z7902 Long term (current) use of antithrombotics/antiplatelets: Secondary | ICD-10-CM | POA: Diagnosis not present

## 2018-06-28 DIAGNOSIS — K219 Gastro-esophageal reflux disease without esophagitis: Secondary | ICD-10-CM | POA: Insufficient documentation

## 2018-06-28 DIAGNOSIS — E1151 Type 2 diabetes mellitus with diabetic peripheral angiopathy without gangrene: Secondary | ICD-10-CM | POA: Diagnosis not present

## 2018-06-28 DIAGNOSIS — Z7982 Long term (current) use of aspirin: Secondary | ICD-10-CM | POA: Diagnosis not present

## 2018-06-28 DIAGNOSIS — E559 Vitamin D deficiency, unspecified: Secondary | ICD-10-CM | POA: Insufficient documentation

## 2018-06-28 DIAGNOSIS — E785 Hyperlipidemia, unspecified: Secondary | ICD-10-CM | POA: Insufficient documentation

## 2018-06-28 DIAGNOSIS — I6389 Other cerebral infarction: Secondary | ICD-10-CM | POA: Insufficient documentation

## 2018-06-28 DIAGNOSIS — F418 Other specified anxiety disorders: Secondary | ICD-10-CM | POA: Insufficient documentation

## 2018-06-28 DIAGNOSIS — M810 Age-related osteoporosis without current pathological fracture: Secondary | ICD-10-CM | POA: Insufficient documentation

## 2018-06-28 DIAGNOSIS — E1142 Type 2 diabetes mellitus with diabetic polyneuropathy: Secondary | ICD-10-CM | POA: Insufficient documentation

## 2018-06-28 DIAGNOSIS — I110 Hypertensive heart disease with heart failure: Secondary | ICD-10-CM | POA: Diagnosis not present

## 2018-06-28 DIAGNOSIS — I2510 Atherosclerotic heart disease of native coronary artery without angina pectoris: Secondary | ICD-10-CM | POA: Insufficient documentation

## 2018-06-28 DIAGNOSIS — Z951 Presence of aortocoronary bypass graft: Secondary | ICD-10-CM | POA: Diagnosis not present

## 2018-06-28 DIAGNOSIS — E1143 Type 2 diabetes mellitus with diabetic autonomic (poly)neuropathy: Secondary | ICD-10-CM | POA: Insufficient documentation

## 2018-06-28 DIAGNOSIS — I5042 Chronic combined systolic (congestive) and diastolic (congestive) heart failure: Secondary | ICD-10-CM | POA: Diagnosis not present

## 2018-06-28 DIAGNOSIS — Z794 Long term (current) use of insulin: Secondary | ICD-10-CM | POA: Insufficient documentation

## 2018-06-28 HISTORY — PX: LOOP RECORDER INSERTION: EP1214

## 2018-06-28 HISTORY — PX: TEE WITHOUT CARDIOVERSION: SHX5443

## 2018-06-28 LAB — GLUCOSE, CAPILLARY: GLUCOSE-CAPILLARY: 111 mg/dL — AB (ref 70–99)

## 2018-06-28 SURGERY — ECHOCARDIOGRAM, TRANSESOPHAGEAL
Anesthesia: Moderate Sedation

## 2018-06-28 SURGERY — LOOP RECORDER INSERTION

## 2018-06-28 MED ORDER — LIDOCAINE-EPINEPHRINE 1 %-1:100000 IJ SOLN
INTRAMUSCULAR | Status: DC | PRN
  Administered 2018-06-28: 30 mL

## 2018-06-28 MED ORDER — MIDAZOLAM HCL 5 MG/ML IJ SOLN
INTRAMUSCULAR | Status: AC
  Filled 2018-06-28: qty 2

## 2018-06-28 MED ORDER — MIDAZOLAM HCL 10 MG/2ML IJ SOLN
INTRAMUSCULAR | Status: DC | PRN
  Administered 2018-06-28: 1 mg via INTRAVENOUS
  Administered 2018-06-28 (×2): 2 mg via INTRAVENOUS

## 2018-06-28 MED ORDER — BUTAMBEN-TETRACAINE-BENZOCAINE 2-2-14 % EX AERO
INHALATION_SPRAY | CUTANEOUS | Status: DC | PRN
  Administered 2018-06-28: 2 via TOPICAL

## 2018-06-28 MED ORDER — DIPHENHYDRAMINE HCL 50 MG/ML IJ SOLN
INTRAMUSCULAR | Status: AC
  Filled 2018-06-28: qty 1

## 2018-06-28 MED ORDER — FENTANYL CITRATE (PF) 100 MCG/2ML IJ SOLN
INTRAMUSCULAR | Status: DC | PRN
  Administered 2018-06-28 (×2): 25 ug via INTRAVENOUS

## 2018-06-28 MED ORDER — SODIUM CHLORIDE 0.9 % IV SOLN
INTRAVENOUS | Status: DC
  Administered 2018-06-28: 09:00:00 via INTRAVENOUS

## 2018-06-28 MED ORDER — FENTANYL CITRATE (PF) 100 MCG/2ML IJ SOLN
INTRAMUSCULAR | Status: AC
  Filled 2018-06-28: qty 2

## 2018-06-28 MED ORDER — LIDOCAINE-EPINEPHRINE 1 %-1:100000 IJ SOLN
INTRAMUSCULAR | Status: AC
  Filled 2018-06-28: qty 1

## 2018-06-28 SURGICAL SUPPLY — 2 items
LOOP REVEAL LINQSYS (Prosthesis & Implant Heart) ×2 IMPLANT
PACK LOOP INSERTION (CUSTOM PROCEDURE TRAY) ×2 IMPLANT

## 2018-06-28 NOTE — Telephone Encounter (Signed)
Pt's aunt called to let us know pt will still be coming to his appointment on Friday 06/30/18. She wanted to ask Dr. Abelardo DieselMcmullen to please look over what was done in the hospital stay due to his stroke.

## 2018-06-28 NOTE — CV Procedure (Signed)
   Transesophageal Echocardiogram  Indications:Stroke  Time out performed  During this procedure the patient is administered a total of Versed 5 mg and Fentanyl 50 mcg to achieve and maintain moderate conscious sedation.  The patient's heart rate, blood pressure, and oxygen saturation are monitored continuously during the procedure. The period of conscious sedation is 25 minutes, of which I was present face-to-face 100% of this time.  Findings:  Left Ventricle: Ejection fraction 35 to 40% with apical distal anteroseptal akinesis  Mitral Valve: Normal leaflets, mild mitral regurgitation  Aortic Valve: Trileaflet, normal  Tricuspid Valve: Normal leaflets, trivial regurgitation  Left Atrium: No left atrial appendage thrombus, no thrombus  Bubble Contrast Study: Negative bubble study, no shunt.  No PFO.  Donato SchultzMark Skains, MD

## 2018-06-28 NOTE — Discharge Instructions (Signed)
Implant site care instructions Keep incision clean and dry for 3 days. You can remove outer dressing tomorrow. Leave steri-strips (little pieces of tape) on until seen in the office for wound check appointment. Call the office [phone]) for redness, drainage, swelling, or fever.     TEE  YOU HAD AN CARDIAC PROCEDURE TODAY: Refer to the procedure report and other information in the discharge instructions given to you for any specific questions about what was found during the examination. If this information does not answer your questions, please call [facility] office at [phone] to clarify.   DIET: Your first meal following the procedure should be a light meal and then it is ok to progress to your normal diet. A half-sandwich or bowl of soup is an example of a good first meal. Heavy or fried foods are harder to digest and may make you feel nauseous or bloated. Drink plenty of fluids but you should avoid alcoholic beverages for 24 hours. If you had a esophageal dilation, please see attached instructions for diet.   ACTIVITY: Your care partner should take you home directly after the procedure. You should plan to take it easy, moving slowly for the rest of the day. You can resume normal activity the day after the procedure however YOU SHOULD NOT DRIVE, use power tools, machinery or perform tasks that involve climbing or major physical exertion for 24 hours (because of the sedation medicines used during the test).   SYMPTOMS TO REPORT IMMEDIATELY: A cardiologist can be reached at any hour. Please call [phone] for any of the following symptoms:  Vomiting of blood or coffee ground material  New, significant abdominal pain  New, significant chest pain or pain under the shoulder blades  Painful or persistently difficult swallowing  New shortness of breath  Black, tarry-looking or red, bloody stools  FOLLOW UP:  Please also call with any specific questions about appointments or  follow up tests.

## 2018-06-28 NOTE — Interval H&P Note (Signed)
History and Physical Interval Note:  06/28/2018 8:50 AM  Travis [name]  has presented today for surgery, with the diagnosis of stroke  The various methods of treatment have been discussed with the patient and family. After consideration of risks, benefits and other options for treatment, the patient has consented to  Procedure(s): TRANSESOPHAGEAL ECHOCARDIOGRAM (TEE) (N/A) as a surgical intervention .  The patient's history has been reviewed, patient examined, no change in status, stable for surgery.  I have reviewed the patient's chart and labs.  Questions were answered to the patient's satisfaction.     Coca ColaMark Skains

## 2018-06-28 NOTE — Consult Note (Addendum)
ELECTROPHYSIOLOGY CONSULT NOTE  Patient ID: Travis [name] MRN: [id number], DOB/AGE: 1971-05-25   Admit date: 06/28/2018 Date of Consult: 06/28/2018  Primary Physician: Wendee Beavers, DO Primary Cardiologist: Dr. Duke Salvia Reason for Consultation: Cryptogenic stroke ; recommendations regarding Implantable Loop Recorder, requested by Dr. Roda Shutters  History of Present Illness Travis [name] was admitted on 06/25/18 with acute stroke, discharge yesterday to f/u this AM for TEE possible loop implant, was to have been yesterday though unable to maintain NPO past breakfast for the TEE.  PMHx includes CAD/CABG (11/2017), chronic CHF (mixed), DM, HTN, HLD, GERD, gastroparesis, PVD with hx of toe amp.  He first developed symptoms while at home, dizziness, , developed L sided weakness and slurred speech.  Imaging demonstratedMultiple small B infarcts s/p tPA, likely embolic source..  he has undergone workup for stroke including echocardiogram and carotid angio.  The patient has was monitored on telemetry during his stay, maintained sinus rhythm with no arrhythmias.  Stroke work-up is to be completed with a TEE.   Echocardiogram this admission demonstrated    Study Conclusions - Left ventricle: The cavity size was normal. Wall thickness was   normal. Systolic function was moderately reduced. The estimated   ejection fraction was in the range of 35% to 40%. Akinesis of the   mid-apicalinferolateral and apical myocardium. Features are   consistent with a pseudonormal left ventricular filling pattern,   with concomitant abnormal relaxation and increased filling   pressure (grade 2 diastolic dysfunction). - Right ventricle: Systolic function was mildly reduced. - Right atrium: The atrium was mildly dilated.    Lab work is reviewed.  Prior to admission, the patient denies chest pain, shortness of breath, he will feel his heart beat strong/fast when laying on his left side sometimes.  Past Medical  History:  Diagnosis Date  . Acute on chronic systolic and diastolic heart failure, NYHA class 3 (HCC) 09/14/2017  . Acute osteomyelitis of metatarsal bone of left foot (HCC) 11/12/2015  . CAD in native artery 09/14/2017  . Closed fracture of right tibial plateau 11/11/2015  . Depression with anxiety   . Diabetes mellitus    INSULIN DEPENDENT Type II  . Diabetic peripheral neuropathy associated with type 2 diabetes mellitus (HCC) 08/30/2008   Qualifier: Diagnosis of  By: Daphine Deutscher FNP, Zena Amos    . Diabetic ulcer of left foot (HCC) 11/14/2015  . Gastroparesis   . GERD (gastroesophageal reflux disease)   . Hypertension   . Left ventricular aneurysm   . Osteomyelitis (HCC)   . Osteoporosis 11/12/2015  . Peripheral neuropathy   . Shortness of breath dyspnea    with exertion  . Vitamin D deficiency 11/12/2015     Surgical History:  Past Surgical History:  Procedure Laterality Date  . AMPUTATION Left 11/28/2015   Procedure: Left 4th Ray Amputation;  Surgeon: Nadara Mustard, MD;  Location: [facility] OR;  Service: Orthopedics;  Laterality: Left;  . AMPUTATION Left 10/29/2016   Procedure: LEFT 5TH RAY AMPUTATION;  Surgeon: Nadara Mustard, MD;  Location: [facility] OR;  Service: Orthopedics;  Laterality: Left;  . ANTERIOR VITRECTOMY Left 04/12/2016   Procedure: ANTERIOR CHAMBER WASH OUT WITH GAS FLUID EXCHANGE;  Surgeon: Carmela Rima, MD;  Location: [facility] OR;  Service: Ophthalmology;  Laterality: Left;  . CARDIAC CATHETERIZATION    . CATARACT EXTRACTION Left   . CORONARY ARTERY BYPASS GRAFT N/A 11/29/2017    LIMA-LAD, SVG-PDA, SVG-CFX  Procedure: CORONARY ARTERY BYPASS GRAFTING (CABG) times three using the  left saphaneous vien. harvested endoscopicly and left internal mammary artery.;  Surgeon: Kerin Perna, MD;  Location: [facility] OR;  Service: Open Heart Surgery;  Laterality: N/A;  . EYE SURGERY     left eye surgery 04/2016  . KNEE SURGERY Left   . RIGHT/LEFT HEART CATH AND CORONARY ANGIOGRAPHY N/A 10/19/2017    Procedure: RIGHT/LEFT HEART CATH AND CORONARY ANGIOGRAPHY;  Surgeon: Swaziland, [name], MD;  Location: [facility] INVASIVE CV LAB;  Service: Cardiovascular;  Laterality: N/A;  . SHOULDER SURGERY Right   . TEE WITHOUT CARDIOVERSION N/A 11/29/2017   Procedure: TRANSESOPHAGEAL ECHOCARDIOGRAM (TEE);  Surgeon: Donata Clay, Theron Arista, MD;  Location: [facility] OR;  Service: Open Heart Surgery;  Laterality: N/A;  . TOE AMPUTATION Left 11/28/15   4th toe     Medications Prior to Admission  Medication Sig Dispense Refill Last Dose  . aspirin EC 325 MG EC tablet Take 1 tablet (325 mg total) by mouth daily. 30 tablet 0 06/24/2018 at Unknown time  . clopidogrel (PLAVIX) 75 MG tablet Take 1 tablet (75 mg total) by mouth daily. 30 tablet 2   . Ferrous Sulfate (IRON) 325 (65 Fe) MG TABS Take 1 tablet (325 mg total) by mouth 2 (two) times daily after a meal. 30 each 0 06/24/2018 at Unknown time  . gabapentin (NEURONTIN) 800 MG tablet TAKE 1 TABLET(800 MG) BY MOUTH THREE TIMES DAILY 90 tablet 0 06/24/2018 at Unknown time  . insulin detemir (LEVEMIR) 100 UNIT/ML injection Inject 0.2 mLs (20 Units total) into the skin at bedtime. Take 1/2 of normal dose tonight 06/27/18 for planned procedure tomorrow. Then resume normal dose 30 mL 3   . metoCLOPramide (REGLAN) 10 MG tablet Take 1 tablet (10 mg total) by mouth 4 (four) times daily -  before meals and at bedtime. 90 tablet 1 06/24/2018 at Unknown time  . metoprolol succinate (TOPROL XL) 25 MG 24 hr tablet Take 0.5 tablets (12.5 mg total) by mouth daily. 30 tablet 5 06/24/2018 at 0800  . ranitidine (ZANTAC) 150 MG tablet Take 1 tablet 2 (two) times daily by mouth.  12 06/24/2018 at Unknown time  . rosuvastatin (CRESTOR) 40 MG tablet Take 1 tablet (40 mg total) by mouth daily. 30 tablet 5 06/24/2018 at Unknown time    Inpatient Medications:   Allergies: No Known Allergies  Social History   Socioeconomic History  . Marital status: Divorced    Spouse name: Not on file  . Number of children: 3   . Years of education: HS  . Highest education level: Not on file  Occupational History  . Occupation: Disabled  Social Needs  . Financial resource strain: Not on file  . Food insecurity:    Worry: Not on file    Inability: Not on file  . Transportation needs:    Medical: Not on file    Non-medical: Not on file  Tobacco Use  . Smoking status: Never Smoker  . Smokeless tobacco: Never Used  Substance and Sexual Activity  . Alcohol use: No  . Drug use: No  . Sexual activity: Yes  Lifestyle  . Physical activity:    Days per week: Not on file    Minutes per session: Not on file  . Stress: Not on file  Relationships  . Social connections:    Talks on phone: Not on file    Gets together: Not on file    Attends religious service: Not on file    Active member of club or organization: Not  on file    Attends meetings of clubs or organizations: Not on file    Relationship status: Not on file  . Intimate partner violence:    Fear of current or ex partner: Not on file    Emotionally abused: Not on file    Physically abused: Not on file    Forced sexual activity: Not on file  Other Topics Concern  . Not on file  Social History Narrative   Lives at home with his children.   Right-handed.   No caffeine use.     Family History  Problem Relation Age of Onset  . Cancer Paternal Grandmother   . COPD Mother   . Heart failure Mother   . Anxiety disorder Mother   . Depression Mother   . Fibromyalgia Mother   . Esophageal cancer Father        Smoker, still does  . Diabetes Sister   . Fibromyalgia Sister   . Diabetes Maternal Uncle   . Diabetes Maternal Grandmother   . GER disease Sister   . Liver disease Paternal Grandfather   . Hemachromatosis Paternal Grandfather   . Diabetes Maternal Grandfather       Review of Systems: All other systems reviewed and are otherwise negative except as noted above.  Physical Exam: There were no vitals filed for this visit.  GEN- The  patient is well appearing, alert and oriented x 3 today.   Head- normocephalic, atraumatic Eyes-  Sclera clear, conjunctiva pink Ears- hearing intact Oropharynx- clear Neck- supple Lungs- CTA b/l, normal work of breathing Heart- RRR, no murmurs, rubs or gallops  GI- soft, NT, ND Extremities- no clubbing, cyanosis, or edema MS- no significant deformity or atrophy Skin- no rash or lesion Psych- euthymic mood, full affect   Labs:   Lab Results  Component Value Date   WBC 11.7 (H) 06/25/2018   HGB 13.9 06/25/2018   HCT 41.0 06/25/2018   MCV 89.4 06/25/2018   PLT 249 06/25/2018    Recent Labs  Lab 06/25/18 0404 06/25/18 0409  NA 140 139  K 4.2 4.1  CL 105 104  CO2 24  --   BUN 13 15  CREATININE 1.08 1.00  CALCIUM 9.5  --   PROT 7.4  --   BILITOT 1.0  --   ALKPHOS 64  --   ALT 16  --   AST 22  --   GLUCOSE 141* 141*   Lab Results  Component Value Date   CKTOTAL 90 10/06/2016   TROPONINI 0.06 (HH) 12/18/2017   Lab Results  Component Value Date   CHOL 141 06/26/2018   CHOL 75 (L) 01/23/2018   CHOL 156 05/14/2009   Lab Results  Component Value Date   HDL 26 (L) 06/26/2018   HDL 32 (L) 01/23/2018   HDL 26 (L) 05/14/2009   Lab Results  Component Value Date   LDLCALC 73 06/26/2018   LDLCALC 29 01/23/2018   LDLCALC 85 05/14/2009   Lab Results  Component Value Date   TRIG 211 (H) 06/26/2018   TRIG 68 01/23/2018   TRIG 226 (H) 05/14/2009   Lab Results  Component Value Date   CHOLHDL 5.4 06/26/2018   CHOLHDL 2.3 01/23/2018   CHOLHDL 6.0 Ratio 05/14/2009   No results found for: LDLDIRECT  No results found for: DDIMER   Radiology/Studies:   Ct Angio Head W Or Wo Contrast Result Date: 06/25/2018 CLINICAL DATA:  Code stroke.  Left-sided drift and ataxia EXAM: CT ANGIOGRAPHY  HEAD AND NECK TECHNIQUE: Multidetector CT imaging of the head and neck was performed using the standard protocol during bolus administration of intravenous contrast. Multiplanar CT  image reconstructions and MIPs were obtained to evaluate the vascular anatomy. Carotid stenosis measurements (when applicable) are obtained utilizing NASCET criteria, using the distal internal carotid diameter as the denominator. COMPARISON:  None. Head CT 06/25/2018 FINDINGS: CT HEAD FINDINGS Brain: There is no mass, hemorrhage or extra-axial collection. The size and configuration of the ventricles and extra-axial CSF spaces are normal. There is no acute or chronic infarction. The brain parenchyma is normal. Unchanged focus of hypoattenuation in the right corona radiata. Vascular: No abnormal hyperdensity of the major intracranial arteries or dural venous sinuses. No intracranial atherosclerosis. Skull: The visualized skull base, calvarium and extracranial soft tissues are normal. Sinuses/Orbits: No fluid levels or advanced mucosal thickening of the visualized paranasal sinuses. No mastoid or middle ear effusion. The orbits are normal. ASPECTS (Alberta Stroke Program Early CT Score) - Ganglionic level infarction (caudate, lentiform nuclei, internal capsule, insula, M1-M3 cortex): 7 - Supraganglionic infarction (M4-M6 cortex): 3 Total score (0-10 with 10 being normal): 10 CTA NECK FINDINGS AORTIC ARCH: There is no calcific atherosclerosis of the aortic arch. There is no aneurysm, dissection or hemodynamically significant stenosis of the visualized ascending aorta and aortic arch. Normal variant aortic arch branching pattern with the brachiocephalic and left common carotid arteries sharing a common origin. The visualized proximal subclavian arteries are widely patent. RIGHT CAROTID SYSTEM: --Common carotid artery: Widely patent origin without common carotid artery dissection or aneurysm. --Internal carotid artery: No dissection, occlusion or aneurysm. No hemodynamically significant stenosis. --External carotid artery: No acute abnormality. LEFT CAROTID SYSTEM: --Common carotid artery: Widely patent origin without  common carotid artery dissection or aneurysm. --Internal carotid artery:No dissection, occlusion or aneurysm. No hemodynamically significant stenosis. --External carotid artery: No acute abnormality. VERTEBRAL ARTERIES: Codominant configuration. Both origins are normal. No dissection, occlusion or flow-limiting stenosis to the vertebrobasilar confluence. SKELETON: There is no bony spinal canal stenosis. No lytic or blastic lesion. OTHER NECK: Normal pharynx, larynx and major salivary glands. No cervical lymphadenopathy. Unremarkable thyroid gland. UPPER CHEST: Lung apices are clear. CTA HEAD FINDINGS ANTERIOR CIRCULATION: --Intracranial internal carotid arteries: Normal. --Anterior cerebral arteries: Normal. Both A1 segments are present. Patent anterior communicating artery. --Middle cerebral arteries: Normal. --Posterior communicating arteries: Present on the left, absent on the right. POSTERIOR CIRCULATION: --Basilar artery: Normal. --Posterior cerebral arteries: There is occlusion of the right PCA P2 segment just beyond the P1-P2 junction. Left PCA is normal. --Superior cerebellar arteries: Normal. --Inferior cerebellar arteries: Normal anterior and posterior inferior cerebellar arteries. VENOUS SINUSES: As permitted by contrast timing, patent. ANATOMIC VARIANTS: None DELAYED PHASE: No parenchymal contrast enhancement. Review of the MIP images confirms the above findings. IMPRESSION: 1. No acute hemorrhage. ASPECTS is 10. These results were communicated to Dr. Georgiana Spinner Aroor at 6:47 am on 06/25/2018 by text page via the [facility] messaging system. 2. Occlusion of the right posterior cerebral artery P2 segment just beyond its P1-P2 junction point. These results were communicated to Dr. Georgiana Spinner Aroor at 6:55 am on 06/25/2018 by telephone. 3. Normal CTA of the neck. Electronically Signed   By: Deatra Robinson M.D.   On: 06/25/2018 06:59     Ct Head Wo Contrast Result Date: 06/25/2018 CLINICAL DATA:  Altered mental  status.  Vomiting. EXAM: CT HEAD WITHOUT CONTRAST TECHNIQUE: Contiguous axial images were obtained from the base of the skull through the vertex without intravenous contrast. COMPARISON:  None. FINDINGS: Brain: There is no mass, hemorrhage or extra-axial collection. The size and configuration of the ventricles and extra-axial CSF spaces are normal. Focus of hypoattenuation in the right corona radiata may be an old infarct. Otherwise Vascular: No abnormal hyperdensity of the major intracranial arteries or dural venous sinuses. No intracranial atherosclerosis. Skull: The visualized skull base, calvarium and extracranial soft tissues are normal. Sinuses/Orbits: No fluid levels or advanced mucosal thickening of the visualized paranasal sinuses. No mastoid or middle ear effusion. The orbits are normal. IMPRESSION: 1. No acute intracranial abnormality. 2. 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 Electronically Signed   By: Deatra RobinsonKevin  Herman M.D.   On: 06/25/2018 04:43    Mr Brain Wo Contrast Result Date: 06/26/2018 CLINICAL DATA:  Left-sided numbness and weakness EXAM: MRI HEAD WITHOUT CONTRAST TECHNIQUE: Multiplanar, multiecho pulse sequences of the brain and surrounding structures were obtained without intravenous contrast. COMPARISON:  CTA head neck 06/25/2018 FINDINGS: BRAIN: Multifocal acute ischemia including the right occipital lobe, left thalamus, left cerebellum and right pons. The largest area of ischemia is in the right occipital lobe, in the distribution of the right posterior cerebral artery. However, there are lesions in multiple vascular territories. The midline structures are normal. Old right frontal corona radiata infarct. White matter signal is otherwise normal. Mildly age advanced atrophy. No acute or chronic hemorrhage. VASCULAR: Loss of the normal right PCA flow void, in keeping with known occlusion of the P2 segment. Other flow voids are maintained. SKULL AND UPPER CERVICAL SPINE: The visualized  skull base, calvarium, upper cervical spine and extracranial soft tissues are normal. SINUSES/ORBITS: Small amount of left mastoid fluid. Paranasal sinuses are clear. Left ocular lens replacement. IMPRESSION: 1. Multifocal acute ischemia within multiple vascular territories, but greatest in the right PCA distribution. Other sites include the left thalamus, left cerebellum and right pons. The scattered distribution suggests a central embolic source. 2. Old right frontal corona radiata infarct and mildly age advanced atrophy. 3. No hemorrhage or mass effect. Electronically Signed   By: Deatra RobinsonKevin  Herman M.D.   On: 06/26/2018 05:36      12-lead ECG SR All prior EKG's in EPIC reviewed with no documented atrial fibrillation Telemetry was reviewed yesterday prior to discharge, SR only  Assessment and Plan:  1. Cryptogenic stroke The patient presents with cryptogenic stroke.  The patient has a TEE planned for this AM.  I spoke at length with the patient about monitoring for afib with either a 30 day event monitor or an implantable loop recorder.  Risks, benefits, and alteratives to implantable loop recorder were discussed with the patient today.   At this time, the patient is very clear in his decision to proceed with implantable loop recorder.   Wound care was reviewed with the patient (keep incision clean and dry for 3 days).  Wound check will be scheduled for the patient  Please call with questions.   Sheilah PigeonRenee Lynn Ursuy, PA-C 06/28/2018   Pt seen interviewed and examined\  Mostly recovered neurologically,  Disabled chronically 2/2 diabetes  [facility]

## 2018-06-28 NOTE — Progress Notes (Signed)
  Echocardiogram Echocardiogram Transesophageal has been performed.  Janalyn HarderWest, Dura Mccormack R 06/28/2018, 9:47 AM

## 2018-06-30 ENCOUNTER — Ambulatory Visit (HOSPITAL_COMMUNITY): Payer: Medicaid Other

## 2018-06-30 ENCOUNTER — Ambulatory Visit: Payer: Medicare Other | Admitting: Family Medicine

## 2018-06-30 NOTE — Progress Notes (Deleted)
   Subjective   Patient ID: Travis [name]    DOB: 10/13/1971, 47 y.o. male   MRN: [id number]  CC: "***"  HPI: Travis CabalMichael T [name] is a 47 y.o. male who presents to clinic today for the following:  ***: ***  ***Pt made full recovery w/o need to PT/OT.  Patient presented on 06/25/2018 with ataxia, lightheadedness found to have dysarthria and left-sided weakness.  CT head consistent with occlusion of right posterior cerebral artery P2 segment and was offered thrombolytics.  Patient achieved full recovery following TPA on 06/25/2018.  MRI brain consistent with multifocal acute ischemia particularly at right PCA distribution, left thalamus, left cerebellum, and right pons.  Neurology believed this was a central embolic source.  She instructed to continue Crestor 40 mg daily along with aspirin 325 mg daily and clopidogrel 75 mg daily (DAPT for 3 months, then Plavix alone).  Cozaar held during hospitalization, consider restarting.  TEE performed 06/28/2018 consistent with EF 35-40% with apical distal anteroseptal akinesis without signs of thrombus or vegetation.  Patient received loop recorder with instructions to follow-up with cardiology.  ROS: see HPI for pertinent.  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 Family history COPD, heart failure, esophogeal cancer (fatehr), fibromyalgia, DM. Smoking status reviewed. Medications reviewed.  Objective   There were no vitals taken for this visit. Vitals and nursing note reviewed.  General: well nourished, well developed, NAD with non-toxic appearance HEENT: normocephalic, atraumatic, moist mucous membranes Neck: supple, non-tender without lymphadenopathy Cardiovascular: regular rate and rhythm without  murmurs, rubs, or gallops Lungs: clear to auscultation bilaterally with normal work of breathing Abdomen: soft, non-tender, non-distended, normoactive bowel sounds Skin: warm, dry, no rashes or lesions, cap refill < 2 seconds Extremities: warm and well perfused, normal tone, no edema  Assessment & Plan   No problem-specific Assessment & Plan notes found for this encounter.  No orders of the defined types were placed in this encounter.  No orders of the defined types were placed in this encounter.   Durward Parcelavid McMullen, DO [facility], PGY-3 06/30/2018, 7:49 AM

## 2018-07-03 ENCOUNTER — Ambulatory Visit (HOSPITAL_COMMUNITY): Payer: Medicaid Other

## 2018-07-05 ENCOUNTER — Ambulatory Visit (INDEPENDENT_AMBULATORY_CARE_PROVIDER_SITE_OTHER): Payer: Medicare Other | Admitting: *Deleted

## 2018-07-05 ENCOUNTER — Ambulatory Visit (HOSPITAL_COMMUNITY): Payer: Medicaid Other

## 2018-07-05 DIAGNOSIS — I639 Cerebral infarction, unspecified: Secondary | ICD-10-CM

## 2018-07-05 LAB — CUP PACEART INCLINIC DEVICE CHECK
Date Time Interrogation Session: 20190724092448
Implantable Pulse Generator Implant Date: 20190717

## 2018-07-05 NOTE — Progress Notes (Signed)
Loop wound check in clinic. Tegaderm, guaze and steri-strips removed. Incision edges approximated, without redness, swelling or drainage. Wound well healed. Patient instructed about wound care.  Battery status: good. R-waves 0.6352mV. No episodes. Brady and pause detection turned off. Carelink monitor not connected since day of implant, he reports it is plugged in at his bedside. Patient instructed to send a manual transmission. Monthly summary reports and ROV with SK PRN.

## 2018-07-07 ENCOUNTER — Ambulatory Visit (HOSPITAL_COMMUNITY): Payer: Medicaid Other

## 2018-07-10 ENCOUNTER — Ambulatory Visit (HOSPITAL_COMMUNITY): Payer: Medicaid Other

## 2018-07-12 ENCOUNTER — Ambulatory Visit (HOSPITAL_COMMUNITY): Payer: Medicaid Other

## 2018-07-12 ENCOUNTER — Encounter (INDEPENDENT_AMBULATORY_CARE_PROVIDER_SITE_OTHER): Payer: Self-pay | Admitting: Family

## 2018-07-12 ENCOUNTER — Other Ambulatory Visit: Payer: Self-pay | Admitting: Family Medicine

## 2018-07-12 ENCOUNTER — Ambulatory Visit (INDEPENDENT_AMBULATORY_CARE_PROVIDER_SITE_OTHER): Payer: Medicare Other | Admitting: Family

## 2018-07-12 ENCOUNTER — Telehealth (INDEPENDENT_AMBULATORY_CARE_PROVIDER_SITE_OTHER): Payer: Self-pay | Admitting: Orthopedic Surgery

## 2018-07-12 VITALS — Ht 74.0 in | Wt 225.0 lb

## 2018-07-12 DIAGNOSIS — Z8631 Personal history of diabetic foot ulcer: Secondary | ICD-10-CM | POA: Diagnosis not present

## 2018-07-12 DIAGNOSIS — Z89432 Acquired absence of left foot: Secondary | ICD-10-CM

## 2018-07-12 DIAGNOSIS — E1142 Type 2 diabetes mellitus with diabetic polyneuropathy: Secondary | ICD-10-CM

## 2018-07-12 DIAGNOSIS — IMO0002 Reserved for concepts with insufficient information to code with codable children: Secondary | ICD-10-CM

## 2018-07-12 NOTE — Telephone Encounter (Signed)
Patient's wife Drinda Buttsnnette called asked if a copy of patient's insurance card can be faxed over [facility]. The number to contact Drinda Buttsnnette is [phone]

## 2018-07-12 NOTE — Telephone Encounter (Signed)
This has been done.

## 2018-07-12 NOTE — Progress Notes (Signed)
Office Visit Note   Patient: Katha CabalMichael T Smithers           Date of Birth: 08/01/1971           MRN: [id number] Visit Date: 07/12/2018              Requested by: Wendee BeaversMcMullen, David J, DO [address] [place], [place] [address] PCP: Wendee BeaversMcMullen, David J, DO  Chief Complaint  Patient presents with  . Left Foot - Pain    HPI: HPI  The patient is a [age] year old gentleman who is seen this has been ongoing for several months.  States following recent hospitalization has nearly healed.  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  Mother concerned for his shoewear rubbing wonders if could have shoe wear to prevent ulcers.  Today in regular shoewear does ambulate with a cane.  Of note was hospitalized this month for stroke.  States has no physical deficits.  Has had diabetic shoes in the past, complains these were too heavy would not like a new pair.  Assessment & Plan: Visit Diagnoses:  1. Foot amputation status, left (HCC)   2. History of diabetic ulcer of foot     Plan: Given an order for custom orthotics.  Follow-up in office as needed.  Follow-Up Instructions: No follow-ups on file.   Physical Exam: Patient is alert and oriented. No adenopathy. Well-dressed. Normal affect. Respirations easy. Steady gait. Left fourth and fifth ray amputation is well-healed well consolidated.  The ulcer over the medial border of the MTP joint left foot is well-healed.  There is callus and nonviable tissue which was debrided with a 10 blade knife back to viable tissue there is no drainage no surrounding erythema no sign of infection.  Imaging: No results found.  Orders:  No orders of the defined types were placed in this encounter.  No orders of the defined types were placed in this encounter.    Procedures: No procedures performed  Clinical Data: No additional findings.  Subjective: Review of Systems  Constitutional: Negative for chills and fever.  Skin: Positive for  wound.    Objective: Vital Signs: Ht 6\' 2"  (1.88 m)   Wt 225 lb (102.1 kg)   BMI 28.89 kg/m   Specialty Comments:  No specialty comments available.  PMFS History: Patient Active Problem List   Diagnosis Date Noted  . Essential hypertension 06/27/2018  . Hyperlipidemia 06/27/2018  . Diabetic retinopathy (HCC) 06/27/2018  . Leukocytosis 06/27/2018  . Ischemic stroke (HCC) - mult small B infarcts s/p tPA, unknown embolic source 06/25/2018  . Left arm numbness 03/03/2018  . Strain of right trapezius muscle 03/03/2018  . Ischemic cardiomyopathy 12/16/2017  . Anxiety with depression 09/16/2017  . Acute on chronic systolic and diastolic heart failure, NYHA class 3 (HCC) 09/14/2017  . CAD in native artery 09/14/2017  . 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 Short Achilles tendon (acquired), left ankle 11/19/2016  . Gastroparesis due to DM (HCC)   . History of diabetic ulcer of foot 11/14/2015  . Vitamin D deficiency 11/12/2015  . Osteoporosis 11/12/2015  . GERD (gastroesophageal reflux disease) 11/03/2015  . Erectile dysfunction 07/11/2009  . Allergic rhinitis 12/11/2008  . 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 Insulin dependent diabetes mellitus with complications (HCC) 08/09/2007   Past Medical History:  Diagnosis Date  . Acute on chronic systolic and diastolic heart failure, NYHA class 3 (HCC) 09/14/2017  .  Acute osteomyelitis of metatarsal bone of left foot (HCC) 11/12/2015  . CAD in native artery 09/14/2017  . Closed fracture of right tibial plateau 11/11/2015  . Depression with anxiety   . Diabetes mellitus    INSULIN DEPENDENT Type II  . Diabetic peripheral neuropathy associated with type 2 diabetes mellitus (HCC) 08/30/2008   Qualifier: Diagnosis of  By: Daphine Deutscher FNP, Zena Amos    . Diabetic ulcer of left foot (HCC) 11/14/2015  . Gastroparesis   . GERD (gastroesophageal reflux disease)   . Hypertension   .  Left ventricular aneurysm   . Osteomyelitis (HCC)   . Osteoporosis 11/12/2015  . Peripheral neuropathy   . Shortness of breath dyspnea    with exertion  . Vitamin D deficiency 11/12/2015    Family History  Problem Relation Age of Onset  . Cancer Paternal Grandmother   . COPD Mother   . Heart failure Mother   . Anxiety disorder Mother   . Depression Mother   . Fibromyalgia Mother   . Esophageal cancer Father        Smoker, still does  . Diabetes Sister   . Fibromyalgia Sister   . Diabetes Maternal Uncle   . Diabetes Maternal Grandmother   . GER disease Sister   . Liver disease Paternal Grandfather   . Hemachromatosis Paternal Grandfather   . Diabetes Maternal Grandfather     Past Surgical History:  Procedure Laterality Date  . AMPUTATION Left 11/28/2015   Procedure: Left 4th Ray Amputation;  Surgeon: Nadara Mustard, MD;  Location: [facility];  Service: Orthopedics;  Laterality: Left;  . AMPUTATION Left 10/29/2016   Procedure: LEFT 5TH RAY AMPUTATION;  Surgeon: Nadara Mustard, MD;  Location: [facility] OR;  Service: Orthopedics;  Laterality: Left;  . ANTERIOR VITRECTOMY Left 04/12/2016   Procedure: ANTERIOR CHAMBER WASH OUT WITH GAS FLUID EXCHANGE;  Surgeon: Carmela Rima, MD;  Location[facility] OR;  Service: Ophthalmology;  Laterality: Left;  . CARDIAC CATHETERIZATION    . CATARACT EXTRACTION Left   . CORONARY ARTERY BYPASS GRAFT N/A 11/29/2017    LIMA-LAD, SVG-PDA, SVG-CFX  Procedure: CORONARY ARTERY BYPASS GRAFTING (CABG) times three using the left saphaneous vien. harvested endoscopicly and left internal mammary artery.;  Surgeon: Kerin Perna, MD;  Location: [facility] OR;  Service: Open Heart Surgery;  Laterality: N/A;  . EYE SURGERY     left eye surgery 04/2016  . KNEE SURGERY Left   . LOOP RECORDER INSERTION N/A 06/28/2018   Procedure: LOOP RECORDER INSERTION;  Surgeon: Duke Salvia, MD;  Location: [facility] INVASIVE CV LAB;  Service: Cardiovascular;  Laterality: N/A;  . RIGHT/LEFT HEART CATH AND  CORONARY ANGIOGRAPHY N/A 10/19/2017   Procedure: RIGHT/LEFT HEART CATH AND CORONARY ANGIOGRAPHY;  Surgeon: Swaziland, [name], MD;  Location: [facility] INVASIVE CV LAB;  Service: Cardiovascular;  Laterality: N/A;  . SHOULDER SURGERY Right   . TEE WITHOUT CARDIOVERSION N/A 11/29/2017   Procedure: TRANSESOPHAGEAL ECHOCARDIOGRAM (TEE);  Surgeon: Donata Clay, Theron Arista, MD;  Location: [facility] OR;  Service: Open Heart Surgery;  Laterality: N/A;  . TEE WITHOUT CARDIOVERSION N/A 06/28/2018   Procedure: TRANSESOPHAGEAL ECHOCARDIOGRAM (TEE);  Surgeon: Jake Bathe, MD;  Location: [facility] ENDOSCOPY;  Service: Cardiovascular;  Laterality: N/A;  . TOE AMPUTATION Left 11/28/15   4th toe   Social History   Occupational History  . Occupation: Disabled  Tobacco Use  . Smoking status: Never Smoker  . Smokeless tobacco: Never Used  Substance and Sexual Activity  . Alcohol use: No  .  Drug use: No  . Sexual activity: Yes

## 2018-07-14 ENCOUNTER — Ambulatory Visit (HOSPITAL_COMMUNITY): Payer: Medicaid Other

## 2018-07-17 ENCOUNTER — Ambulatory Visit (HOSPITAL_COMMUNITY): Payer: Medicaid Other

## 2018-07-19 ENCOUNTER — Ambulatory Visit (HOSPITAL_COMMUNITY): Payer: Medicaid Other

## 2018-07-19 NOTE — Progress Notes (Deleted)
   Subjective   Patient ID: Travis [name]    DOB: 03/12/1971, 47 y.o. male   MRN: [id number]  CC: "***"  HKatha CabalI: Travis [name] is a 47 y.o. male who presents to clinic today for the following:  ***: ***  ***Patient last seen by me on 3/22.  Hospitalized 06/25/2018-06/27/2018 for multifocal acute ischemic infarcts particularly on right PCA distribution.  Source was undetermined.  She instructed to continue aspirin and clopidogrel for 3 months followed by Plavix alone.  Patient is here today for follow-up.  Patient has follow-up with neurology on 08/02/2018.  ROS: see HPI for pertinent.  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 Family history COPD, heart failure, esophogeal cancer (fatehr), fibromyalgia, DM. Smoking status reviewed. Medications reviewed.  Objective   There were no vitals taken for this visit. Vitals and nursing note reviewed.  General: well nourished, well developed, NAD with non-toxic appearance HEENT: normocephalic, atraumatic, moist mucous membranes Neck: supple, non-tender without lymphadenopathy Cardiovascular: regular rate and rhythm without murmurs, rubs, or gallops Lungs: clear to auscultation bilaterally with normal work of breathing Abdomen: soft, non-tender, non-distended, normoactive bowel sounds Skin: warm, dry, no rashes or lesions, cap refill < 2 seconds Extremities: warm and well perfused, normal tone, no edema  Assessment & Plan   No problem-specific Assessment & Plan notes found for this encounter.  No orders of the defined types were placed in this encounter.  No orders of the defined types were placed in this encounter.   Travis Parcelavid McMullen, DO [facility], PGY-3 07/19/2018, 5:36  PM

## 2018-07-20 ENCOUNTER — Other Ambulatory Visit: Payer: Self-pay | Admitting: General Surgery

## 2018-07-20 ENCOUNTER — Ambulatory Visit: Payer: Medicare Other | Admitting: Family Medicine

## 2018-07-20 DIAGNOSIS — R112 Nausea with vomiting, unspecified: Secondary | ICD-10-CM

## 2018-07-21 ENCOUNTER — Other Ambulatory Visit: Payer: Medicare Other

## 2018-07-21 ENCOUNTER — Ambulatory Visit (HOSPITAL_COMMUNITY): Payer: Medicaid Other

## 2018-07-22 ENCOUNTER — Encounter

## 2018-07-24 ENCOUNTER — Ambulatory Visit (HOSPITAL_COMMUNITY): Payer: Medicaid Other

## 2018-07-24 ENCOUNTER — Other Ambulatory Visit (HOSPITAL_COMMUNITY): Payer: Self-pay | Admitting: General Surgery

## 2018-07-24 ENCOUNTER — Other Ambulatory Visit: Payer: Self-pay | Admitting: General Surgery

## 2018-07-24 DIAGNOSIS — R112 Nausea with vomiting, unspecified: Secondary | ICD-10-CM

## 2018-07-26 ENCOUNTER — Ambulatory Visit (HOSPITAL_COMMUNITY): Payer: Medicaid Other

## 2018-07-28 ENCOUNTER — Ambulatory Visit (HOSPITAL_COMMUNITY): Payer: Medicaid Other

## 2018-07-28 ENCOUNTER — Ambulatory Visit (HOSPITAL_COMMUNITY): Payer: Medicare Other

## 2018-07-28 ENCOUNTER — Encounter (HOSPITAL_COMMUNITY): Payer: Self-pay

## 2018-07-31 ENCOUNTER — Ambulatory Visit (HOSPITAL_COMMUNITY): Payer: Medicaid Other

## 2018-08-01 ENCOUNTER — Telehealth: Payer: Self-pay | Admitting: Cardiology

## 2018-08-01 NOTE — Telephone Encounter (Signed)
LMOVM requesting that pt send manual transmission b/c home monitor has not updated in at least 14 days.    

## 2018-08-02 ENCOUNTER — Ambulatory Visit (HOSPITAL_COMMUNITY): Payer: Medicaid Other

## 2018-08-02 ENCOUNTER — Encounter: Payer: Self-pay | Admitting: Adult Health

## 2018-08-02 ENCOUNTER — Ambulatory Visit (INDEPENDENT_AMBULATORY_CARE_PROVIDER_SITE_OTHER): Payer: Medicare Other | Admitting: Adult Health

## 2018-08-02 VITALS — BP 113/72 | HR 82 | Ht 74.0 in | Wt 228.2 lb

## 2018-08-02 DIAGNOSIS — E1142 Type 2 diabetes mellitus with diabetic polyneuropathy: Secondary | ICD-10-CM | POA: Diagnosis not present

## 2018-08-02 DIAGNOSIS — I10 Essential (primary) hypertension: Secondary | ICD-10-CM | POA: Diagnosis not present

## 2018-08-02 DIAGNOSIS — I639 Cerebral infarction, unspecified: Secondary | ICD-10-CM | POA: Diagnosis not present

## 2018-08-02 DIAGNOSIS — E785 Hyperlipidemia, unspecified: Secondary | ICD-10-CM | POA: Diagnosis not present

## 2018-08-02 NOTE — Progress Notes (Signed)
I agree with the above plan 

## 2018-08-02 NOTE — Progress Notes (Signed)
[facility] [address] [place]. [place] [id number] [phone]       OFFICE FOLLOW UP NOTE  Travis [name] Date of Birth:  05/24/1971 Medical Record Number:  [id number]   Reason for Referral:  hospital stroke follow up  CHIEF COMPLAINT:  Chief Complaint  Patient presents with  . Follow-up    Follow up from stroke from hospital pt with Drinda Buttsnnette his aunt     HPI: Travis [name] is being seen today for initial visit in the office for multiple small bilateral infarcts s/p TPA with unknown source on 06/25/18. History obtained from patient, aunt and chart review. Reviewed all radiology images and labs personally.  Travis [name] a 47 y.o.malewith history of DM, PVD with left toe amputations, CAD with CABG in 11/2017, anxiety, depression, diabetic neuropathy and retinopathy, DM gastroparesis, hypertension, cardiomyopathy, and CHF who presented with dizziness, ataxia, dysarthria, and difficulty ambulating. Following neuro worsening, the patient received IV TPA on Sunday, 06/25/2018 at 0630.   CT head reviewed and was negative for acute abnormality.  MRI head showed right occipital, left thalamic, left cerebellar and right pontine infarcts along with old right CR infarcts.  CTA head and neck showed occlusion of the right PCA P1 P2 junction.  2D echo showed an EF of 35 to 40% without cardiac source of embolus.  Lower extremity Doppler negative for DVT.  TEE was scheduled as outpatient due to high anxiety regarding hospital and agreed upon TEE as outpatient procedure and recommended if negative consider placement of an implantable loop recorder to rule out atrial fibrillation as source of stroke.  LDL 73 and recommended to continue Crestor at discharge.  A1c 6.9 and recommended tight glycemic control with close PCP follow-up.  HTN stable during admission and recommended long-term BP goal normotensive range.  Patient was on aspirin 325 prior and recommended DAPT with  aspirin and Plavix for 3 months and Plavix alone.  Patient was discharged home in stable condition without.  Patient underwent TEE on 06/28/2018 which showed EF of 35 to 40% without evidence of thrombus therefore loop recorder was placed.  Patient is being seen today for hospital follow-up and is accompanied by his aunt.  He states he continues to have mild dysarthria but has been resolving.  He also continues to complain of dizziness for which she uses a cane for but this has been chronic due to chronic neuropathy and amputation of toes on left foot.  He continues to take both aspirin and Plavix without side effects of bleeding or bruising.  Continues to take Crestor without side effects myalgias.  Blood pressure today satisfactory 113/72.  Patient does monitor glucose levels at home which on average 115.  He does live in his own home with his 2 children.  He states he has been dealing with "gallbladder issues" for the past couple years and most recently has been having a gallbladder attack and eventually will need it removed.  He states that his surgeon needs clearance from us prior to doing any procedure.  This office has not received any clearance paperwork or form regarding this procedure and informed patient that typically we would recommend waiting 3 to 6 months prior to any type of procedure due to high risk of stroke during that time especially while off blood thinners.  Advised patient to have surgeon fax this paperwork regarding surgical procedure needed and priority of procedure needing to be done as surgeon is not part of epic. It is  unable to be determined if risk of waiting to do procedure is greater than risk of stopping blood thinners for stroke prevention within the first 3 months post stroke.  Patient verbalized understanding.  Loop recorder has not shown atrial fibrillation thus far.  Denies new or worsening stroke/TIA symptoms.    ROS:   14 system review of systems performed and negative  with exception of fatigue, swelling in legs, blurred vision, loss of vision, cough, diarrhea, numbness, weakness, slurred speech, depression, anxiety, none of sleep, decreased energy, just interest in activities and insomnia  PMH:  Past Medical History:  Diagnosis Date  . Acute on chronic systolic and diastolic heart failure, NYHA class 3 (HCC) 09/14/2017  . Acute osteomyelitis of metatarsal bone of left foot (HCC) 11/12/2015  . CAD in native artery 09/14/2017  . Closed fracture of right tibial plateau 11/11/2015  . Depression with anxiety   . Diabetes mellitus    INSULIN DEPENDENT Type II  . Diabetic peripheral neuropathy associated with type 2 diabetes mellitus (HCC) 08/30/2008   Qualifier: Diagnosis of  By: Daphine Deutscher FNP, Zena Amos    . Diabetic ulcer of left foot (HCC) 11/14/2015  . Gastroparesis   . GERD (gastroesophageal reflux disease)   . Hypertension   . Left ventricular aneurysm   . Osteomyelitis (HCC)   . Osteoporosis 11/12/2015  . Peripheral neuropathy   . Shortness of breath dyspnea    with exertion  . Vitamin D deficiency 11/12/2015    PSH:  Past Surgical History:  Procedure Laterality Date  . AMPUTATION Left 11/28/2015   Procedure: Left 4th Ray Amputation;  Surgeon: Nadara Mustard, MD;  Location: [facility] OR;  Service: Orthopedics;  Laterality: Left;  . AMPUTATION Left 10/29/2016   Procedure: LEFT 5TH RAY AMPUTATION;  Surgeon: Nadara Mustard, MD;  Location: [facility] OR;  Service: Orthopedics;  Laterality: Left;  . ANTERIOR VITRECTOMY Left 04/12/2016   Procedure: ANTERIOR CHAMBER WASH OUT WITH GAS FLUID EXCHANGE;  Surgeon: Carmela Rima, MD;  Location: [facility] OR;  Service: Ophthalmology;  Laterality: Left;  . CARDIAC CATHETERIZATION    . CATARACT EXTRACTION Left   . CORONARY ARTERY BYPASS GRAFT N/A 11/29/2017    LIMA-LAD, SVG-PDA, SVG-CFX  Procedure: CORONARY ARTERY BYPASS GRAFTING (CABG) times three using the left saphaneous vien. harvested endoscopicly and left internal mammary artery.;   Surgeon: Kerin Perna, MD;  Location: [facility] OR;  Service: Open Heart Surgery;  Laterality: N/A;  . EYE SURGERY     left eye surgery 04/2016  . KNEE SURGERY Left   . LOOP RECORDER INSERTION N/A 06/28/2018   Procedure: LOOP RECORDER INSERTION;  Surgeon: Duke Salvia, MD;  Location: [facility] INVASIVE CV LAB;  Service: Cardiovascular;  Laterality: N/A;  . RIGHT/LEFT HEART CATH AND CORONARY ANGIOGRAPHY N/A 10/19/2017   Procedure: RIGHT/LEFT HEART CATH AND CORONARY ANGIOGRAPHY;  Surgeon: Swaziland, [name], MD;  Location: [facility] INVASIVE CV LAB;  Service: Cardiovascular;  Laterality: N/A;  . SHOULDER SURGERY Right   . TEE WITHOUT CARDIOVERSION N/A 11/29/2017   Procedure: TRANSESOPHAGEAL ECHOCARDIOGRAM (TEE);  Surgeon: Donata Clay, Theron Arista, MD;  Location: [facility] OR;  Service: Open Heart Surgery;  Laterality: N/A;  . TEE WITHOUT CARDIOVERSION N/A 06/28/2018   Procedure: TRANSESOPHAGEAL ECHOCARDIOGRAM (TEE);  Surgeon: Jake Bathe, MD;  Location: [facility] ENDOSCOPY;  Service: Cardiovascular;  Laterality: N/A;  . TOE AMPUTATION Left 11/28/15   4th toe    Social History:  Social History   Socioeconomic History  . Marital status: Divorced    Spouse  name: Not on file  . Number of children: 3  . Years of education: HS  . Highest education level: Not on file  Occupational History  . Occupation: Disabled  Social Needs  . Financial resource strain: Not on file  . Food insecurity:    Worry: Not on file    Inability: Not on file  . Transportation needs:    Medical: Not on file    Non-medical: Not on file  Tobacco Use  . Smoking status: Never Smoker  . Smokeless tobacco: Never Used  Substance and Sexual Activity  . Alcohol use: No  . Drug use: No  . Sexual activity: Yes  Lifestyle  . Physical activity:    Days per week: Not on file    Minutes per session: Not on file  . Stress: Not on file  Relationships  . Social connections:    Talks on phone: Not on file    Gets together: Not on file    Attends religious  service: Not on file    Active member of club or organization: Not on file    Attends meetings of clubs or organizations: Not on file    Relationship status: Not on file  . Intimate partner violence:    Fear of current or ex partner: Not on file    Emotionally abused: Not on file    Physically abused: Not on file    Forced sexual activity: Not on file  Other Topics Concern  . Not on file  Social History Narrative   Lives at home with his children.   Right-handed.   No caffeine use.    Family History:  Family History  Problem Relation Age of Onset  . Cancer Paternal Grandmother   . COPD Mother   . Heart failure Mother   . Anxiety disorder Mother   . Depression Mother   . Fibromyalgia Mother   . Esophageal cancer Father        Smoker, still does  . Diabetes Sister   . Fibromyalgia Sister   . Diabetes Maternal Uncle   . Diabetes Maternal Grandmother   . GER disease Sister   . Liver disease Paternal Grandfather   . Hemachromatosis Paternal Grandfather   . Diabetes Maternal Grandfather     Medications:   Current Outpatient Medications on File Prior to Visit  Medication Sig Dispense Refill  . aspirin EC 325 MG EC tablet Take 1 tablet (325 mg total) by mouth daily. 30 tablet 0  . clopidogrel (PLAVIX) 75 MG tablet Take 1 tablet (75 mg total) by mouth daily. 30 tablet 2  . Ferrous Sulfate (IRON) 325 (65 Fe) MG TABS Take 1 tablet (325 mg total) by mouth 2 (two) times daily after a meal. 30 each 0  . gabapentin (NEURONTIN) 800 MG tablet TAKE 1 TABLET(800 MG) BY MOUTH THREE TIMES DAILY 90 tablet 0  . insulin detemir (LEVEMIR) 100 UNIT/ML injection Inject 0.2 mLs (20 Units total) into the skin at bedtime. Take 1/2 of normal dose tonight 06/27/18 for planned procedure tomorrow. Then resume normal dose (Patient taking differently: Inject 20 Units into the skin at bedtime. Take 20 units at qhs) 30 mL 3  . metoCLOPramide (REGLAN) 10 MG tablet Take 1 tablet (10 mg total) by mouth 4 (four)  times daily -  before meals and at bedtime. 90 tablet 1  . metoprolol succinate (TOPROL XL) 25 MG 24 hr tablet Take 0.5 tablets (12.5 mg total) by mouth daily. 30 tablet 5  .  ranitidine (ZANTAC) 150 MG tablet Take 1 tablet 2 (two) times daily by mouth.  12  . rosuvastatin (CRESTOR) 40 MG tablet Take 1 tablet (40 mg total) by mouth daily. 30 tablet 5  . losartan (COZAAR) 25 MG tablet TK 1/2 T PO D  5   No current facility-administered medications on file prior to visit.     Allergies:  No Known Allergies   Physical Exam  Vitals:   08/02/18 1238  BP: 113/72  Pulse: 82  Weight: 228 lb 3.2 oz (103.5 kg)  Height: 6\' 2"  (1.88 m)   Body mass index is 29.3 kg/m. No exam data present  General: well developed, well nourished, pleasant middle aged caucasian male, seated, in no evident distress Head: head normocephalic and atraumatic.   Neck: supple with no carotid or supraclavicular bruits Cardiovascular: regular rate and rhythm, no murmurs Musculoskeletal: no deformity Skin:  no rash/petichiae Vascular:  Normal pulses all extremities  Neurologic Exam Mental Status: Awake and fully alert. Mild dysarthria. Oriented to place and time. Recent and remote memory intact. Attention span, concentration and fund of knowledge appropriate. Mood and affect appropriate.  Cranial Nerves: Fundoscopic exam reveals sharp disc margins. Pupils equal, briskly reactive to light. Extraocular movements full without nystagmus. 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 Hearing intact. Facial sensation intact. Face, tongue, palate moves normally and symmetrically.  Motor: Normal bulk and tone. Normal strength in all tested extremity muscles. Sensory.: intact to touch , pinprick , position and vibratory sensation.  Coordination: Rapid alternating movements normal in all extremities. Finger-to-nose and heel-to-shin performed accurately bilaterally. Gait and Station: Arises from chair with mild  difficulty. Stance is normal. Gait demonstrates normal stride length and balance with assistance of cane.  Romberg positive and unable to tandem walk Reflexes: 1+ and symmetric. Toes downgoing.    NIHSS  0 Modified Rankin  0    Diagnostic Data (Labs, Imaging, Testing)   Ct Head Code Stroke Wo Contrast Ct Angio Head W Or Wo Contrast Ct Angio Neck W Or Wo Contrast 06/25/2018  IMPRESSION:  1. No acute hemorrhage. ASPECTS is 10.  2. Occlusion of the right posterior cerebral artery P2 segment just beyond its P1-P2 junction point.   Ct Head Wo Contrast 06/25/2018 IMPRESSION:  1. No acute intracranial abnormality.  2. 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   MRI Brain WO Contrast 06/25/2018 1. Multifocal acute ischemia within multiple vascular territories, but greatest in the right PCA distribution. Other sites include the left thalamus, left cerebellum and right pons. The scattered distribution suggests a central embolic source. 2. Old right frontal corona radiata infarct and mildly age advanced atrophy. 3. No hemorrhage or mass effect.  Transthoracic Echocardiogram - Left ventricle: The cavity size was normal. Wall thickness wasnormal. Systolic function was moderately reduced. The estimatedejection fraction was in the range of 35% to 40%. Akinesis of themid-apicalinferolateral and apical myocardium. Features areconsistent with a pseudonormal left ventricular filling pattern,with concomitant abnormal relaxation and increased fillingpressure (grade 2 diastolic dysfunction). - Right ventricle: Systolic function was mildly reduced. - Right atrium: The atrium was mildly dilated.  LE Venous Doppler Negative for DVT  Echo TEE 06/28/2018 Study Conclusions - Left ventricle: Systolic function was moderately reduced. The   estimated ejection fraction was in the range of 35% to 40%. - Aortic valve: No evidence of vegetation. - Mitral valve: No evidence of vegetation. There  was mild   regurgitation. - Left atrium: No evidence of thrombus in the atrial cavity or  appendage. No evidence of thrombus in the appendage. - Right atrium: No evidence of thrombus in the atrial cavity or   appendage. - Atrial septum: Echo contrast study showed no right-to-left atrial   level shunt, at baseline or with provocation. - Tricuspid valve: No evidence of vegetation. - Pulmonic valve: No evidence of vegetation. - Superior vena cava: The study excluded a thrombus.  Loop recorder insertion 06/28/2018   ASSESSMENT: Travis [name] is a 47 y.o. year old male here with multiple small bilateral infarcts s/p TPA on 06/25/2018 secondary to likely embolic with unknown source. Vascular risk factors include DM, HTN, HLD, CHF and CAD. Patient is being seen today for hospital stroke follow up and overall is recovering well except for mild dysarthria and chronic dizziness.     PLAN: -Continue aspirin 325 mg daily and clopidogrel 75 mg daily  and Lipitor for secondary stroke prevention -Stop aspirin and continue Plavix after 09/26/2018 for total of 3 months DAPT -F/u with PCP regarding your HTN, HLD and DM management -Request paperwork from current surgeon regarding possible procedure for gallbladder clearance.  Patient aware of recommendation of waiting 3 to 6 months prior to procedure unless emergent -continue to monitor BP at home -Continue to stay active as tolerated and maintain a healthy diet -Continue to monitor loop recorder for atrial fibrillation -Spoke to patient regarding Brien Mates trial but unfortunately not interested at this time as he has difficulty with transportation and not feeling well due to gallbladder -Maintain strict control of hypertension with blood pressure goal below 130/90, diabetes with hemoglobin A1c goal below 6.5% and cholesterol with LDL cholesterol (bad cholesterol) goal below 70 mg/dL. I also advised the patient to eat a healthy diet with plenty of whole  grains, cereals, fruits and vegetables, exercise regularly and maintain ideal body weight.  Follow up in 3 months or call earlier if needed   Greater than 50% of time during this 25 minute visit was spent on counseling,explanation of diagnosis of multiple small bilateral infarcts, reviewing risk factor management of HLD, HTN and DM, planning of further management, discussion with patient and family and coordination of care    George Hugh, [facility]  [facility] [address] [place], [place] [address]  Phone [phone] Fax [phone]

## 2018-08-02 NOTE — Patient Instructions (Addendum)
Continue aspirin 325 mg daily and clopidogrel 75 mg daily  and crestor  for secondary stroke prevention  Stop aspirin and continue plavix only after 09/26/18  Continue to follow up with PCP regarding cholesterol, blood pressure and diabetes management   Regarding surgical procedure for gallbladder, we do recommend waiting at least 3-6 months prior to any procedure unless it is emergent. Please notify your surgeon to send paperwork to our office for more information regarding this procedure.   Continue to monitor blood pressure at home  Maintain strict control of hypertension with blood pressure goal below 130/90, diabetes with hemoglobin A1c goal below 6.5% and cholesterol with LDL cholesterol (bad cholesterol) goal below 70 mg/dL. I also advised the patient to eat a healthy diet with plenty of whole grains, cereals, fruits and vegetables, exercise regularly and maintain ideal body weight.  Followup in the future with me in 3 months or call earlier if needed       Thank you for [facility]. I hope we have been able to provide you high quality care today.  You may receive a patient satisfaction survey over the next few weeks. We would appreciate your feedback and comments so that we may continue to improve ourselves and the health of our patients.

## 2018-08-04 ENCOUNTER — Other Ambulatory Visit: Payer: Self-pay | Admitting: General Surgery

## 2018-08-04 ENCOUNTER — Ambulatory Visit (HOSPITAL_COMMUNITY): Payer: Medicaid Other

## 2018-08-04 DIAGNOSIS — K828 Other specified diseases of gallbladder: Secondary | ICD-10-CM

## 2018-08-07 ENCOUNTER — Ambulatory Visit (HOSPITAL_COMMUNITY): Payer: Medicaid Other

## 2018-08-09 ENCOUNTER — Ambulatory Visit (HOSPITAL_COMMUNITY): Payer: Medicaid Other

## 2018-08-11 ENCOUNTER — Ambulatory Visit (HOSPITAL_COMMUNITY): Payer: Medicaid Other

## 2018-08-11 ENCOUNTER — Ambulatory Visit
Admission: RE | Admit: 2018-08-11 | Discharge: 2018-08-11 | Disposition: A | Discharge status: Home / Self Care | Payer: Medicare Other | Source: Ambulatory Visit | Attending: General Surgery | Admitting: General Surgery

## 2018-08-11 DIAGNOSIS — K828 Other specified diseases of gallbladder: Secondary | ICD-10-CM

## 2018-08-16 ENCOUNTER — Ambulatory Visit (HOSPITAL_COMMUNITY): Payer: Medicaid Other

## 2018-08-16 ENCOUNTER — Other Ambulatory Visit: Payer: Self-pay | Admitting: Family Medicine

## 2018-08-16 DIAGNOSIS — E1142 Type 2 diabetes mellitus with diabetic polyneuropathy: Secondary | ICD-10-CM

## 2018-08-18 ENCOUNTER — Ambulatory Visit (HOSPITAL_COMMUNITY): Payer: Medicaid Other

## 2018-08-21 ENCOUNTER — Ambulatory Visit (HOSPITAL_COMMUNITY): Payer: Medicaid Other

## 2018-08-28 ENCOUNTER — Telehealth: Payer: Self-pay | Admitting: *Deleted

## 2018-08-28 NOTE — Telephone Encounter (Signed)
Pts aunt calls and wants to inform provider that his Zantac is not helping.  Per Micheal it has never really worked that well, but its even less now.     Will forward to MD. [place]eger, Maryjo RochesterJessica Dawn, CMA

## 2018-08-29 ENCOUNTER — Other Ambulatory Visit (HOSPITAL_COMMUNITY): Payer: Self-pay | Admitting: General Surgery

## 2018-08-29 DIAGNOSIS — K8020 Calculus of gallbladder without cholecystitis without obstruction: Secondary | ICD-10-CM

## 2018-08-29 NOTE — Telephone Encounter (Signed)
Patient needs to see me soon. Has been quite some time and there have been a lot of events in the interim. He can stop the Zantac (usually works for a limited time). He may need a PPI or change his current PPI. He may need to be seen by GI if has been chronic. Please advise. Thank you.  Durward Parcelavid McMullen, DO [facility], PGY-3

## 2018-08-29 NOTE — Telephone Encounter (Signed)
Attempted to call patient and schedule an appointment at both Pt's number and aunt Drinda Buttsnnette Adderley's #. No answer at either and no voicemail.  Glennie Hawk.Mithran Strike R, CMA

## 2018-09-01 NOTE — Telephone Encounter (Signed)
Called and left a message for pt to make an appointment to be evaluated for his issues.  Travis [name].Leandrew Keech R, CMA

## 2018-09-04 ENCOUNTER — Other Ambulatory Visit: Payer: Self-pay | Admitting: Student

## 2018-09-04 ENCOUNTER — Ambulatory Visit (INDEPENDENT_AMBULATORY_CARE_PROVIDER_SITE_OTHER): Payer: Medicare Other | Admitting: *Deleted

## 2018-09-04 DIAGNOSIS — I639 Cerebral infarction, unspecified: Secondary | ICD-10-CM

## 2018-09-04 NOTE — Progress Notes (Signed)
[facility] Summary Report / Loop Recorder 

## 2018-09-05 ENCOUNTER — Other Ambulatory Visit: Payer: Self-pay

## 2018-09-05 ENCOUNTER — Ambulatory Visit (HOSPITAL_COMMUNITY)
Admission: RE | Admit: 2018-09-05 | Discharge: 2018-09-05 | Disposition: A | Discharge status: Home / Self Care | Payer: Medicare Other | Source: Ambulatory Visit | Attending: Radiology | Admitting: Radiology

## 2018-09-05 ENCOUNTER — Encounter (HOSPITAL_COMMUNITY): Payer: Self-pay

## 2018-09-05 ENCOUNTER — Observation Stay (HOSPITAL_COMMUNITY)
Admission: RE | Admit: 2018-09-05 | Discharge: 2018-09-06 | Disposition: A | Discharge status: Home / Self Care | Payer: Medicare Other | Source: Ambulatory Visit | Attending: Internal Medicine | Admitting: Internal Medicine

## 2018-09-05 VITALS — BP 150/81 | HR 76 | Temp 98.2°F | Resp 15 | Ht 73.0 in | Wt 220.0 lb

## 2018-09-05 DIAGNOSIS — I110 Hypertensive heart disease with heart failure: Secondary | ICD-10-CM | POA: Diagnosis not present

## 2018-09-05 DIAGNOSIS — K3184 Gastroparesis: Secondary | ICD-10-CM | POA: Diagnosis not present

## 2018-09-05 DIAGNOSIS — I2583 Coronary atherosclerosis due to lipid rich plaque: Secondary | ICD-10-CM

## 2018-09-05 DIAGNOSIS — Z434 Encounter for attention to other artificial openings of digestive tract: Principal | ICD-10-CM

## 2018-09-05 DIAGNOSIS — K8010 Calculus of gallbladder with chronic cholecystitis without obstruction: Secondary | ICD-10-CM | POA: Diagnosis not present

## 2018-09-05 DIAGNOSIS — K219 Gastro-esophageal reflux disease without esophagitis: Secondary | ICD-10-CM | POA: Diagnosis not present

## 2018-09-05 DIAGNOSIS — I2510 Atherosclerotic heart disease of native coronary artery without angina pectoris: Secondary | ICD-10-CM

## 2018-09-05 DIAGNOSIS — Z7982 Long term (current) use of aspirin: Secondary | ICD-10-CM | POA: Insufficient documentation

## 2018-09-05 DIAGNOSIS — Z8249 Family history of ischemic heart disease and other diseases of the circulatory system: Secondary | ICD-10-CM | POA: Insufficient documentation

## 2018-09-05 DIAGNOSIS — Z8673 Personal history of transient ischemic attack (TIA), and cerebral infarction without residual deficits: Secondary | ICD-10-CM | POA: Diagnosis not present

## 2018-09-05 DIAGNOSIS — I5022 Chronic systolic (congestive) heart failure: Secondary | ICD-10-CM | POA: Insufficient documentation

## 2018-09-05 DIAGNOSIS — K8020 Calculus of gallbladder without cholecystitis without obstruction: Secondary | ICD-10-CM | POA: Diagnosis not present

## 2018-09-05 DIAGNOSIS — M810 Age-related osteoporosis without current pathological fracture: Secondary | ICD-10-CM | POA: Diagnosis not present

## 2018-09-05 DIAGNOSIS — Z79899 Other long term (current) drug therapy: Secondary | ICD-10-CM | POA: Insufficient documentation

## 2018-09-05 DIAGNOSIS — Z833 Family history of diabetes mellitus: Secondary | ICD-10-CM | POA: Insufficient documentation

## 2018-09-05 DIAGNOSIS — Z9842 Cataract extraction status, left eye: Secondary | ICD-10-CM | POA: Diagnosis not present

## 2018-09-05 DIAGNOSIS — E1142 Type 2 diabetes mellitus with diabetic polyneuropathy: Secondary | ICD-10-CM | POA: Diagnosis not present

## 2018-09-05 DIAGNOSIS — E559 Vitamin D deficiency, unspecified: Secondary | ICD-10-CM | POA: Diagnosis not present

## 2018-09-05 DIAGNOSIS — Z951 Presence of aortocoronary bypass graft: Secondary | ICD-10-CM | POA: Insufficient documentation

## 2018-09-05 DIAGNOSIS — Z7902 Long term (current) use of antithrombotics/antiplatelets: Secondary | ICD-10-CM | POA: Insufficient documentation

## 2018-09-05 DIAGNOSIS — Z89422 Acquired absence of other left toe(s): Secondary | ICD-10-CM | POA: Insufficient documentation

## 2018-09-05 DIAGNOSIS — E785 Hyperlipidemia, unspecified: Secondary | ICD-10-CM | POA: Diagnosis not present

## 2018-09-05 DIAGNOSIS — Z794 Long term (current) use of insulin: Secondary | ICD-10-CM | POA: Diagnosis not present

## 2018-09-05 DIAGNOSIS — Z9889 Other specified postprocedural states: Secondary | ICD-10-CM | POA: Insufficient documentation

## 2018-09-05 DIAGNOSIS — I255 Ischemic cardiomyopathy: Secondary | ICD-10-CM | POA: Diagnosis not present

## 2018-09-05 HISTORY — PX: IR PERC CHOLECYSTOSTOMY: IMG2326

## 2018-09-05 LAB — GLUCOSE, CAPILLARY
GLUCOSE-CAPILLARY: 219 mg/dL — AB (ref 70–99)
Glucose-Capillary: 198 mg/dL — ABNORMAL HIGH (ref 70–99)
Glucose-Capillary: 239 mg/dL — ABNORMAL HIGH (ref 70–99)
Glucose-Capillary: 204 mg/dL — ABNORMAL HIGH (ref 70–99)

## 2018-09-05 LAB — COMPREHENSIVE METABOLIC PANEL
ALT: 13 U/L (ref 0–44)
AST: 16 U/L (ref 15–41)
Albumin: 4.1 g/dL (ref 3.5–5.0)
Alkaline Phosphatase: 49 U/L (ref 38–126)
Anion gap: 8 (ref 5–15)
BUN: 14 mg/dL (ref 6–20)
CALCIUM: 9.4 mg/dL (ref 8.9–10.3)
CO2: 27 mmol/L (ref 22–32)
CREATININE: 0.9 mg/dL (ref 0.61–1.24)
Chloride: 103 mmol/L (ref 98–111)
Glucose, Bld: 219 mg/dL — ABNORMAL HIGH (ref 70–99)
Potassium: 4.2 mmol/L (ref 3.5–5.1)
Sodium: 138 mmol/L (ref 135–145)
TOTAL PROTEIN: 7.2 g/dL (ref 6.5–8.1)
Total Bilirubin: 1.2 mg/dL (ref 0.3–1.2)

## 2018-09-05 LAB — APTT: aPTT: 32 seconds (ref 24–36)

## 2018-09-05 LAB — PROTIME-INR
INR: 1.01
Prothrombin Time: 13.2 seconds (ref 11.4–15.2)

## 2018-09-05 LAB — CBC
HEMATOCRIT: 42.1 % (ref 39.0–52.0)
Hemoglobin: 14.6 g/dL (ref 13.0–17.0)
MCH: 30.2 pg (ref 26.0–34.0)
MCHC: 34.7 g/dL (ref 30.0–36.0)
MCV: 87.2 fL (ref 78.0–100.0)
PLATELETS: 320 10*3/uL (ref 150–400)
RBC: 4.83 MIL/uL (ref 4.22–5.81)
RDW: 13 % (ref 11.5–15.5)
WBC: 7.7 10*3/uL (ref 4.0–10.5)

## 2018-09-05 MED ORDER — ASPIRIN EC 325 MG PO TBEC: 325.0000 mg | DELAYED_RELEASE_TABLET | Freq: Every day | ORAL | Status: DC

## 2018-09-05 MED ORDER — IOPAMIDOL (ISOVUE-300) INJECTION 61%
INTRAVENOUS | Status: AC
  Administered 2018-09-05: 20 mL
  Filled 2018-09-05: qty 50

## 2018-09-05 MED ORDER — FENTANYL CITRATE (PF) 100 MCG/2ML IJ SOLN
INTRAMUSCULAR | Status: AC
  Filled 2018-09-05: qty 2

## 2018-09-05 MED ORDER — FERROUS SULFATE 325 (65 FE) MG PO TABS
325.0000 mg | ORAL_TABLET | Freq: Two times a day (BID) | ORAL | Status: DC
  Administered 2018-09-05 – 2018-09-06 (×2): 325 mg via ORAL
  Filled 2018-09-05 (×2): qty 1

## 2018-09-05 MED ORDER — SODIUM CHLORIDE 0.9 % IV SOLN
INTRAVENOUS | Status: DC
  Administered 2018-09-05: 10:00:00 via INTRAVENOUS

## 2018-09-05 MED ORDER — PIPERACILLIN-TAZOBACTAM 3.375 G IVPB 30 MIN
3.3750 g | INTRAVENOUS | Status: AC
  Administered 2018-09-05: 3.375 g via INTRAVENOUS
  Filled 2018-09-05: qty 50

## 2018-09-05 MED ORDER — LIDOCAINE HCL 1 % IJ SOLN
INTRAMUSCULAR | Status: AC
  Filled 2018-09-05: qty 20

## 2018-09-05 MED ORDER — ROSUVASTATIN CALCIUM 20 MG PO TABS
40.0000 mg | ORAL_TABLET | Freq: Every day | ORAL | Status: DC
  Administered 2018-09-05 – 2018-09-06 (×2): 40 mg via ORAL
  Filled 2018-09-05 (×2): qty 2

## 2018-09-05 MED ORDER — IRON 325 (65 FE) MG PO TABS: 325.0000 mg | ORAL_TABLET | Freq: Two times a day (BID) | ORAL | Status: DC

## 2018-09-05 MED ORDER — PIPERACILLIN-TAZOBACTAM 3.375 G IVPB
INTRAVENOUS | Status: AC
  Filled 2018-09-05: qty 50

## 2018-09-05 MED ORDER — LIDOCAINE-EPINEPHRINE (PF) 2 %-1:200000 IJ SOLN
INTRAMUSCULAR | Status: AC
  Filled 2018-09-05: qty 20

## 2018-09-05 MED ORDER — METOPROLOL SUCCINATE ER 25 MG PO TB24
12.5000 mg | ORAL_TABLET | Freq: Every day | ORAL | Status: DC
  Administered 2018-09-05 – 2018-09-06 (×2): 12.5 mg via ORAL
  Filled 2018-09-05 (×2): qty 1

## 2018-09-05 MED ORDER — MIDAZOLAM HCL 2 MG/2ML IJ SOLN
INTRAMUSCULAR | Status: AC
  Filled 2018-09-05: qty 4

## 2018-09-05 MED ORDER — MIDAZOLAM HCL 2 MG/2ML IJ SOLN
INTRAMUSCULAR | Status: AC | PRN
  Administered 2018-09-05 (×3): 1 mg via INTRAVENOUS

## 2018-09-05 MED ORDER — FENTANYL CITRATE (PF) 100 MCG/2ML IJ SOLN
INTRAMUSCULAR | Status: AC | PRN
  Administered 2018-09-05 (×3): 50 ug via INTRAVENOUS

## 2018-09-05 MED ORDER — METOCLOPRAMIDE HCL 10 MG PO TABS
10.0000 mg | ORAL_TABLET | Freq: Three times a day (TID) | ORAL | Status: DC
  Administered 2018-09-05 – 2018-09-06 (×4): 10 mg via ORAL
  Filled 2018-09-05 (×4): qty 1

## 2018-09-05 MED ORDER — IOPAMIDOL (ISOVUE-300) INJECTION 61%
50.0000 mL | Freq: Once | INTRAVENOUS | Status: AC | PRN
  Administered 2018-09-05: 20 mL

## 2018-09-05 MED ORDER — INSULIN DETEMIR 100 UNIT/ML ~~LOC~~ SOLN
15.0000 [IU] | Freq: Every day | SUBCUTANEOUS | Status: DC
  Administered 2018-09-05: 15 [IU] via SUBCUTANEOUS
  Filled 2018-09-05 (×2): qty 0.15

## 2018-09-05 MED ORDER — ASPIRIN 325 MG PO TABS
325.0000 mg | ORAL_TABLET | Freq: Every day | ORAL | Status: DC
  Administered 2018-09-06: 325 mg via ORAL
  Filled 2018-09-05: qty 1

## 2018-09-05 MED ORDER — INSULIN ASPART 100 UNIT/ML ~~LOC~~ SOLN
0.0000 [IU] | Freq: Three times a day (TID) | SUBCUTANEOUS | Status: DC
  Administered 2018-09-05: 3 [IU] via SUBCUTANEOUS
  Administered 2018-09-06: 2 [IU] via SUBCUTANEOUS

## 2018-09-05 MED ORDER — CLOPIDOGREL BISULFATE 75 MG PO TABS
75.0000 mg | ORAL_TABLET | Freq: Every day | ORAL | Status: DC
  Administered 2018-09-06: 75 mg via ORAL
  Filled 2018-09-05: qty 1

## 2018-09-05 MED ORDER — SODIUM CHLORIDE 0.9% FLUSH
5.0000 mL | Freq: Three times a day (TID) | INTRAVENOUS | Status: DC
  Administered 2018-09-05 – 2018-09-06 (×4): 5 mL

## 2018-09-05 MED ORDER — ONDANSETRON HCL 4 MG/2ML IJ SOLN
4.0000 mg | Freq: Four times a day (QID) | INTRAMUSCULAR | Status: DC | PRN
  Administered 2018-09-05: 4 mg via INTRAVENOUS
  Filled 2018-09-05 (×2): qty 2

## 2018-09-05 MED ORDER — HYDROCODONE-ACETAMINOPHEN 5-325 MG PO TABS
1.0000 | ORAL_TABLET | ORAL | Status: DC | PRN
  Administered 2018-09-05 (×2): 1 via ORAL
  Filled 2018-09-05 (×3): qty 1

## 2018-09-05 MED ORDER — ONDANSETRON HCL 4 MG/2ML IJ SOLN
4.0000 mg | Freq: Once | INTRAMUSCULAR | Status: AC
  Administered 2018-09-05: 4 mg via INTRAVENOUS
  Filled 2018-09-05: qty 2

## 2018-09-05 MED ORDER — ONDANSETRON HCL 4 MG PO TABS
4.0000 mg | ORAL_TABLET | Freq: Four times a day (QID) | ORAL | Status: DC | PRN
  Administered 2018-09-06: 4 mg via ORAL
  Filled 2018-09-05: qty 1

## 2018-09-05 NOTE — H&P (Signed)
History and Physical    Travis [name] ZOX:[id number] DOB: 12/23/1970 DOA: 09/05/2018  I have briefly reviewed the patient[facility]  PCP: Wendee BeaversMcMullen, David J, DO  Patient coming from: home  Chief Complaint: post cholecystostomy  HPI: Travis [name] is a 47 y.o. 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  He was diagnosed with cholecystitis however felt not to be a good surgical candidate, and was referred by general surgery to IR for cholecystostomy which she underwent today.  We are asked to admit and observe for 24 hours post procedure given complex medical patient as well as dual antiplatelet therapy, pain control.  On my interview patient complains of soreness on the right upper abdomen where he had the cholecystostomy placed, no nausea or vomiting.  Denies any chest pain, denies any shortness of breath.  Denies any significant focal weakness, he had dysarthria with the most recent stroke but now has improved.  Review of Systems: As per HPI otherwise 10 point review of systems negative.   Past Medical History:  Diagnosis Date  . Acute on chronic systolic and diastolic heart failure, NYHA class 3 (HCC) 09/14/2017  . Acute osteomyelitis of metatarsal bone of left foot (HCC) 11/12/2015  . CAD in native artery 09/14/2017  . Closed fracture of right tibial plateau 11/11/2015  . Depression with anxiety   . Diabetes mellitus    INSULIN DEPENDENT Type II  . Diabetic peripheral neuropathy associated with type 2 diabetes mellitus (HCC) 08/30/2008   Qualifier: Diagnosis of   By: Daphine DeutscherMartin FNP, Zena AmosNykedtra    . Diabetic ulcer of left foot (HCC) 11/14/2015  . Gastroparesis   . GERD (gastroesophageal reflux disease)   . Hypertension   . Left ventricular aneurysm   . Osteomyelitis (HCC)   . Osteoporosis 11/12/2015  . Peripheral neuropathy   . Shortness of breath dyspnea    with exertion  . Vitamin D deficiency 11/12/2015    Past Surgical History:  Procedure Laterality Date  . AMPUTATION Left 11/28/2015   Procedure: Left 4th Ray Amputation;  Surgeon: Nadara MustardMarcus Duda V, MD;  [facility];  Service: Orthopedics;  Laterality: Left;  . AMPUTATION Left 10/29/2016   Procedure: LEFT 5TH RAY AMPUTATION;  Surgeon: Nadara MustardMarcus V Duda, MD;  Location: [facility] OR;  Service: Orthopedics;  Laterality: Left;  . ANTERIOR VITRECTOMY Left 04/12/2016   Procedure: ANTERIOR CHAMBER WASH OUT WITH GAS FLUID EXCHANGE;  Surgeon: Carmela RimaNarendra Patel, MD;  [facility];  Service: Ophthalmology;  Laterality: Left;  . CARDIAC CATHETERIZATION    . CATARACT EXTRACTION Left   . CORONARY ARTERY BYPASS GRAFT N/A 11/29/2017    LIMA-LAD, SVG-PDA, SVG-CFX  Procedure: CORONARY ARTERY BYPASS GRAFTING (CABG) times three using the left saphaneous vien. harvested endoscopicly and left internal mammary artery.;  Surgeon: Kerin PernaVan Trigt, Peter, MD;  [facility];  Service: Open Heart Surgery;  Laterality: N/A;  . EYE SURGERY     left eye surgery 04/2016  . KNEE SURGERY Left   . LOOP RECORDER INSERTION N/A 06/28/2018   Procedure: LOOP RECORDER INSERTION;  Surgeon: Duke SalviaKlein, Steven C, MD;  [facility];  Service: Cardiovascular;  Laterality: N/A;  . RIGHT/LEFT HEART CATH AND CORONARY ANGIOGRAPHY N/A 10/19/2017   Procedure: RIGHT/LEFT HEART CATH AND CORONARY ANGIOGRAPHY;  Surgeon: Swaziland, [name], MD;  [facility] CV LAB;  Service: Cardiovascular;  Laterality: N/A;  . SHOULDER SURGERY Right   . TEE WITHOUT CARDIOVERSION N/A 11/29/2017   Procedure: TRANSESOPHAGEAL ECHOCARDIOGRAM (TEE);  Surgeon: Donata Clay,  Theron Arista, MD;  Location: [facility] OR;  Service: Open Heart Surgery;  Laterality: N/A;  . TEE WITHOUT CARDIOVERSION N/A 06/28/2018   Procedure: TRANSESOPHAGEAL ECHOCARDIOGRAM (TEE);  Surgeon: Jake Bathe, MD;  Location: [facility] ENDOSCOPY;  Service: Cardiovascular;  Laterality: N/A;  . TOE AMPUTATION Left 11/28/15   4th toe     reports that he has never smoked. He has never used smokeless tobacco. He reports that he does not drink alcohol or use drugs.  No Known Allergies  Family History  Problem Relation Age of Onset  . Cancer Paternal Grandmother   . COPD Mother   . Heart failure Mother   . Anxiety disorder Mother   . Depression Mother   . Fibromyalgia Mother   . Esophageal cancer Father        Smoker, still does  . Diabetes Sister   . Fibromyalgia Sister   . Diabetes Maternal Uncle   . Diabetes Maternal Grandmother   . GER disease Sister   . Liver disease Paternal Grandfather   . Hemachromatosis Paternal Grandfather   . Diabetes Maternal Grandfather     Prior to Admission medications   Medication Sig Start Date End Date Taking? Authorizing Provider  aspirin EC 325 MG EC tablet Take 1 tablet (325 mg total) by mouth daily. 12/04/17  Yes Barrett, Erin R, PA-C  clopidogrel (PLAVIX) 75 MG tablet Take 1 tablet (75 mg total) by mouth daily. 06/28/18  Yes Layne Benton, NP  gabapentin (NEURONTIN) 800 MG tablet TAKE 1 TABLET(800 MG) BY MOUTH THREE TIMES DAILY 08/17/18  Yes Wendee Beavers, DO  insulin detemir (LEVEMIR) 100 UNIT/ML injection Inject 0.2 mLs (20 Units total) into the skin at bedtime. Take 1/2 of normal dose tonight 06/27/18 for planned procedure tomorrow. Then resume normal dose Patient taking differently: Inject 20 Units into the skin at bedtime. Take 20 units at qhs 06/27/18  Yes Biby, Jani Files, NP  losartan (COZAAR) 25 MG tablet TK 1/2 T PO D 07/12/18  Yes [provider]  metoCLOPramide (REGLAN) 10 MG tablet Take 1 tablet (10 mg total) by mouth 4 (four) times daily -   before meals and at bedtime. 04/12/17  Yes Noralee Stain, DO  metoprolol succinate (TOPROL XL) 25 MG 24 hr tablet Take 0.5 tablets (12.5 mg total) by mouth daily. 01/23/18  Yes Barrett, Joline Salt, PA-C  rosuvastatin (CRESTOR) 40 MG tablet Take 1 tablet (40 mg total) by mouth daily. 09/14/17 06/25/25 Yes Chilton Si, MD  Ferrous Sulfate (IRON) 325 (65 Fe) MG TABS Take 1 tablet (325 mg total) by mouth 2 (two) times daily after a meal. 12/04/17   Barrett, Erin R, PA-C  ranitidine (ZANTAC) 150 MG tablet Take 1 tablet 2 (two) times daily by mouth. 10/01/17   [provider]    Physical Exam: Vitals:   09/05/18 1120 09/05/18 1136 09/05/18 1205 09/05/18 1229  BP: (!) 173/90 (!) 166/78 (!) 154/75 (!) 150/81  Pulse: 73 76 74 76  Resp:  20 16 16   Temp:  98.3 F (36.8 C) 98.3 F (36.8 C) 98.2 F (36.8 C)  TempSrc:  Oral Oral Oral  SpO2: 99% 100% 100% 100%    Constitutional: NAD, calm, comfortable Eyes: PERRL, lids and conjunctivae normal ENMT: Mucous membranes are moist.  Neck: normal, supple Respiratory: clear to auscultation bilaterally, no wheezing, no crackles. Normal respiratory effort. No accessory muscle use.  Cardiovascular: Regular rate and rhythm, no murmurs / rubs / gallops. No extremity edema. 2+ pedal pulses.  CABG scar present, healing well Abdomen: no tenderness on the left side, cholecystostomy tube draining well Musculoskeletal: no clubbing / cyanosis. Normal muscle tone.  Skin: no rashes, lesions, ulcers. No induration Neurologic: CN 2-12 grossly intact. Strength 5/5 in all 4.  Psychiatric: Normal judgment and insight. Alert and oriented x 3. Normal mood.   Labs on Admission: I have personally reviewed following labs and imaging studies  CBC: Recent Labs  Lab 09/05/18 0951  WBC 7.7  HGB 14.6  HCT 42.1  MCV 87.2  PLT 320   Basic Metabolic Panel: Recent Labs  Lab 09/05/18 0951  NA 138  K 4.2  CL 103  CO2 27  GLUCOSE 219*  BUN 14  CREATININE 0.90    CALCIUM 9.4   GFR: CrCl cannot be calculated (Unknown ideal weight.). Liver Function Tests: Recent Labs  Lab 09/05/18 0951  AST 16  ALT 13  ALKPHOS 49  BILITOT 1.2  PROT 7.2  ALBUMIN 4.1   No results for input(s): LIPASE, AMYLASE in the last 168 hours. No results for input(s): AMMONIA in the last 168 hours. Coagulation Profile: Recent Labs  Lab 09/05/18 0951  INR 1.01   Cardiac Enzymes: No results for input(s): CKTOTAL, CKMB, CKMBINDEX, TROPONINI in the last 168 hours. BNP (last 3 results) No results for input(s): PROBNP in the last 8760 hours. HbA1C: No results for input(s): HGBA1C in the last 72 hours. CBG: Recent Labs  Lab 09/05/18 0936 09/05/18 1136  GLUCAP 198* 239*   Lipid Profile: No results for input(s): CHOL, HDL, LDLCALC, TRIG, CHOLHDL, LDLDIRECT in the last 72 hours. Thyroid Function Tests: No results for input(s): TSH, T4TOTAL, FREET4, T3FREE, THYROIDAB in the last 72 hours. Anemia Panel: No results for input(s): VITAMINB12, FOLATE, FERRITIN, TIBC, IRON, RETICCTPCT in the last 72 hours. Urine analysis:    Component Value Date/Time   COLORURINE YELLOW 06/25/2018 0730   APPEARANCEUR CLEAR 06/25/2018 0730   LABSPEC 1.031 (H) 06/25/2018 0730   PHURINE 5.0 06/25/2018 0730   GLUCOSEU 150 (A) 06/25/2018 0730   HGBUR SMALL (A) 06/25/2018 0730   HGBUR negative 09/17/2009 0834   BILIRUBINUR NEGATIVE 06/25/2018 0730   KETONESUR 20 (A) 06/25/2018 0730   PROTEINUR 30 (A) 06/25/2018 0730   UROBILINOGEN 1.0 04/30/2016 1338   NITRITE NEGATIVE 06/25/2018 0730   LEUKOCYTESUR NEGATIVE 06/25/2018 0730     Radiological Exams on Admission: No results found.   Assessment/Plan Active Problems:   Cholecystostomy care [facility])   Cholecystitis status post cholecystostomy -Not a surgical candidate given multiple comorbidities as well as requirement for dual antiplatelet therapy in the setting of a recent stroke, agree with observation overnight -He has  significant nausea and poor p.o. intake prior to having this procedure done, already feels better and wants to eat, will allow clear liquids for now and advance as tolerated to soft diet -Continue his home Reglan  Insulin-dependent diabetes mellitus -Continue lower dose Levemir as well as sliding scale insulin, closely monitor CBGs  Coronary artery disease status post CABG -No chest pain, this appears stable, continue DAPT  Previous CVA -No significant residual deficits, continue dual antiplatelet therapy  Hypertension -Continue beta-blockers  Hyperlipidemia -continue statin   Chronic systolic CHF -In the setting of ischemic cardiomyopathy, most recent 2D echo was done in July 2019 and showed an ejection fraction of 35-40% -He appears euvolemic on exam no peripheral edema, continue to monitor, he is not on any diuretics at home    DVT prophylaxis: ambulatory status, SCDs  Code Status: Partial code, no intubation Family Communication: Wife present at bedside Disposition Plan: Home 1 day Consults called: None    Admission status: Observation  At the point of initial evaluation, it is my clinical opinion that admission for OBSERVATION is reasonable and necessary because the patient's presenting complaints in the context of their chronic conditions represent sufficient risk of deterioration or significant morbidity to constitute reasonable grounds for close observation in the hospital setting, but that the patient may be medically stable for discharge from the hospital within 24 to 48 hours.     Pamella Pert, MD [facility] Pager [phone]  If 7PM-7AM, please contact night-coverage [email or web address] Password [facility]  09/05/2018, 12:56 PM

## 2018-09-05 NOTE — Procedures (Signed)
Pre procedural Dx: Acute cholecysitis Post procedural Dx: Same  Technically successful US and Fluoro guided placement of a 10 Fr drainage catheter placement into the gallbladder lumen. Chole tube connected to gravity bag.  EBL: None  Complications: None immediate  Jay James Lafalce, MD Pager #: [phone]   

## 2018-09-05 NOTE — Consult Note (Signed)
Chief Complaint: Patient was seen in consultation today for percutaneous cholecystostomy  Referring Physician(s): Wilson,Eric  Supervising Physician: Simonne ComeWatts, John  [facility]  History of Present Illness: Travis [name] is a 47 y.o. male with multiple medical problems including heart failure, coronary artery disease with prior CABG, diabetes with peripheral neuropathy, GERD, osteomyelitis of the left foot with prior left fourth and fifth ray amputation, hypertension, hyperlipidemia and stroke of unknown etiology in July of this year status post TPA,, currently on aspirin and Plavix.  He has a history of symptomatic cholelithiasis with biliary dyskinesia and associated nausea/intermittent bile emesis and diarrhea who presents today for percutaneous cholecystostomy.  Due to recent stroke patient is not candidate for surgery at this time.  Past Medical History:  Diagnosis Date  . Acute on chronic systolic and diastolic heart failure, NYHA class 3 (HCC) 09/14/2017  . Acute osteomyelitis of metatarsal bone of left foot (HCC) 11/12/2015  . CAD in native artery 09/14/2017  . Closed fracture of right tibial plateau 11/11/2015  . Depression with anxiety   . Diabetes mellitus    INSULIN DEPENDENT Type II  . Diabetic peripheral neuropathy associated with type 2 diabetes mellitus (HCC) 08/30/2008   Qualifier: Diagnosis of  By: Daphine DeutscherMartin FNP, Zena AmosNykedtra    . Diabetic ulcer of left foot (HCC) 11/14/2015  . Gastroparesis   . GERD (gastroesophageal reflux disease)   . Hypertension   . Left ventricular aneurysm   . Osteomyelitis (HCC)   . Osteoporosis 11/12/2015  . Peripheral neuropathy   . Shortness of breath dyspnea    with exertion  . Vitamin D deficiency 11/12/2015    Past Surgical History:  Procedure Laterality Date  . AMPUTATION Left 11/28/2015   Procedure: Left 4th Ray Amputation;  Surgeon: Nadara MustardMarcus Duda V, MD;  [facility];  Service: Orthopedics;  Laterality: Left;    . AMPUTATION Left 10/29/2016   Procedure: LEFT 5TH RAY AMPUTATION;  Surgeon: Nadara MustardMarcus V Duda, MD;  Location: [facility] OR;  Service: Orthopedics;  Laterality: Left;  . ANTERIOR VITRECTOMY Left 04/12/2016   Procedure: ANTERIOR CHAMBER WASH OUT WITH GAS FLUID EXCHANGE;  Surgeon: Carmela RimaNarendra Patel, MD;  [facility];  Service: Ophthalmology;  Laterality: Left;  . CARDIAC CATHETERIZATION    . CATARACT EXTRACTION Left   . CORONARY ARTERY BYPASS GRAFT N/A 11/29/2017    LIMA-LAD, SVG-PDA, SVG-CFX  Procedure: CORONARY ARTERY BYPASS GRAFTING (CABG) times three using the left saphaneous vien. harvested endoscopicly and left internal mammary artery.;  Surgeon: Kerin PernaVan Trigt, Peter, MD;  [facility];  Service: Open Heart Surgery;  Laterality: N/A;  . EYE SURGERY     left eye surgery 04/2016  . KNEE SURGERY Left   . LOOP RECORDER INSERTION N/A 06/28/2018   Procedure: LOOP RECORDER INSERTION;  Surgeon: Duke SalviaKlein, Steven C, MD;  [facility];  Service: Cardiovascular;  Laterality: N/A;  . RIGHT/LEFT HEART CATH AND CORONARY ANGIOGRAPHY N/A 10/19/2017   Procedure: RIGHT/LEFT HEART CATH AND CORONARY ANGIOGRAPHY;  Surgeon: SwazilandJordan, [name], MD;  [facility];  Service: Cardiovascular;  Laterality: N/A;  . SHOULDER SURGERY Right   . TEE WITHOUT CARDIOVERSION N/A 11/29/2017   Procedure: TRANSESOPHAGEAL ECHOCARDIOGRAM (TEE);  Surgeon: Donata ClayVan Trigt, Theron AristaPeter, MD;  [facility];  Service: Open Heart Surgery;  Laterality: N/A;  . TEE WITHOUT CARDIOVERSION N/A 06/28/2018   Procedure: TRANSESOPHAGEAL ECHOCARDIOGRAM (TEE);  Surgeon: Jake BatheSkains, Mark C, MD;  [facility];  Service: Cardiovascular;  Laterality: N/A;  .  TOE AMPUTATION Left 11/28/15   4th toe    Allergies: Patient has no known allergies.  Medications: Prior to Admission medications   Medication Sig Start Date End Date Taking? Authorizing Provider  aspirin EC 325 MG EC tablet Take 1 tablet (325 mg total) by mouth daily. 12/04/17  Yes  Barrett, Erin R, PA-C  clopidogrel (PLAVIX) 75 MG tablet Take 1 tablet (75 mg total) by mouth daily. 06/28/18  Yes Layne Benton, NP  gabapentin (NEURONTIN) 800 MG tablet TAKE 1 TABLET(800 MG) BY MOUTH THREE TIMES DAILY 08/17/18  Yes Wendee Beavers, DO  insulin detemir (LEVEMIR) 100 UNIT/ML injection Inject 0.2 mLs (20 Units total) into the skin at bedtime. Take 1/2 of normal dose tonight 06/27/18 for planned procedure tomorrow. Then resume normal dose Patient taking differently: Inject 20 Units into the skin at bedtime. Take 20 units at qhs 06/27/18  Yes Biby, Jani Files, NP  losartan (COZAAR) 25 MG tablet TK 1/2 T PO D 07/12/18  Yes [provider]  metoCLOPramide (REGLAN) 10 MG tablet Take 1 tablet (10 mg total) by mouth 4 (four) times daily -  before meals and at bedtime. 04/12/17  Yes Noralee Stain, DO  metoprolol succinate (TOPROL XL) 25 MG 24 hr tablet Take 0.5 tablets (12.5 mg total) by mouth daily. 01/23/18  Yes Barrett, Joline Salt, PA-C  rosuvastatin (CRESTOR) 40 MG tablet Take 1 tablet (40 mg total) by mouth daily. 09/14/17 06/25/25 Yes Chilton Si, MD  Ferrous Sulfate (IRON) 325 (65 Fe) MG TABS Take 1 tablet (325 mg total) by mouth 2 (two) times daily after a meal. 12/04/17   Barrett, Erin R, PA-C  ranitidine (ZANTAC) 150 MG tablet Take 1 tablet 2 (two) times daily by mouth. 10/01/17   [provider]     Family History  Problem Relation Age of Onset  . Cancer Paternal Grandmother   . COPD Mother   . Heart failure Mother   . Anxiety disorder Mother   . Depression Mother   . Fibromyalgia Mother   . Esophageal cancer Father        Smoker, still does  . Diabetes Sister   . Fibromyalgia Sister   . Diabetes Maternal Uncle   . Diabetes Maternal Grandmother   . GER disease Sister   . Liver disease Paternal Grandfather   . Hemachromatosis Paternal Grandfather   . Diabetes Maternal Grandfather     Social History   Socioeconomic History  . Marital status: Divorced      Spouse name: Not on file  . Number of children: 3  . Years of education: HS  . Highest education level: Not on file  Occupational History  . Occupation: Disabled  Social Needs  . Financial resource strain: Not on file  . Food insecurity:    Worry: Not on file    Inability: Not on file  . Transportation needs:    Medical: Not on file    Non-medical: Not on file  Tobacco Use  . Smoking status: Never Smoker  . Smokeless tobacco: Never Used  Substance and Sexual Activity  . Alcohol use: No  . Drug use: No  . Sexual activity: Yes  Lifestyle  . Physical activity:    Days per week: Not on file    Minutes per session: Not on file  . Stress: Not on file  Relationships  . Social connections:    Talks on phone: Not on file    Gets together: Not on file  Attends religious service: Not on file    Active member of club or organization: Not on file    Attends meetings of clubs or organizations: Not on file    Relationship status: Not on file  Other Topics Concern  . Not on file  Social History Narrative   Lives at home with his children.   Right-handed.   No caffeine use.      Review of Systems : see above; denies fever, headache, chest pain, back pain, or abnormal bleeding.  He does have fatigue, upper and lower extremity paresthesias with some weakness in lower extremities, occasional dysarthria, balance difficulties, anxiety, insomnia, some dyspnea with exertion, occasional cough, left-sided abdominal discomfort; also notes some intermittent sweating posterior neck region  Vital Signs: BP (!) 141/91 (BP Location: Right Arm)   Pulse 85   Temp 98.2 F (36.8 C) (Oral)   Resp 18   SpO2 100%   Physical Exam awake, alert.  Chest with distant breath sounds bilaterally.  Heart with regular rate and rhythm.  Abdomen soft, positive bowel sounds, mild left abdominal tenderness.  No significant lower extremity edema; diminished sensation in lower extremities; strength  ok  Imaging: US Abdomen Limited Ruq  Result Date: 08/11/2018 CLINICAL DATA:  Biliary dyskinesia. EXAM: ULTRASOUND ABDOMEN LIMITED RIGHT UPPER QUADRANT COMPARISON:  Ultrasound of July 07, 2017. FINDINGS: Gallbladder: Cholelithiasis is noted without gallbladder wall thickening or pericholecystic fluid. No sonographic Murphy's sign is noted. Sludge is present as well. Common bile duct: Diameter: 4 mm which is within normal limits. Liver: No focal lesion identified. Within normal limits in parenchymal echogenicity. Portal vein is patent on color Doppler imaging with normal direction of blood flow towards the liver. IMPRESSION: Cholelithiasis and sludge is noted within gallbladder lumen, but no evidence of cholecystitis is noted. No other abnormality seen in the right upper quadrant of the abdomen. Electronically Signed   By: Lupita Raider, M.D.   On: 08/11/2018 14:25    Labs:  CBC: Recent Labs    12/17/17 1815 12/18/17 0256 06/25/18 0404 06/25/18 0409 09/05/18 0951  WBC 9.5 11.7* 11.7*  --  7.7  HGB 10.0* 9.3* 13.9 13.9 14.6  HCT 31.4* 29.5* 42.2 41.0 42.1  PLT 501* 441* 249  --  320    COAGS: Recent Labs    11/28/17 0931 11/29/17 1330 06/25/18 0404 09/05/18 0951  INR 1.06 1.47 1.12 1.01  APTT 33 32 30 32    BMP: Recent Labs    12/17/17 1815 12/18/17 0256 01/23/18 0913 06/25/18 0404 06/25/18 0409  NA 133* 134* 137 140 139  K 4.3 3.8 4.8 4.2 4.1  CL 99* 102 99 105 104  CO2 23 22 24 24   --   GLUCOSE 153* 162* 169* 141* 141*  BUN 8 12 13 13 15   CALCIUM 9.1 8.8* 9.3 9.5  --   CREATININE 0.88 0.97 0.99 1.08 1.00  GFRNONAA >60 >60 91 >60  --   GFRAA >60 >60 105 >60  --     LIVER FUNCTION TESTS: Recent Labs    11/28/17 0931 12/17/17 1815 01/23/18 0913 06/25/18 0404  BILITOT 0.9 1.5* 0.5 1.0  AST 23 25 14 22   ALT 25 19 17 16   ALKPHOS 58 126 65 64  PROT 6.3* 6.8 6.2 7.4  ALBUMIN 3.6 3.4* 3.9 4.0    TUMOR MARKERS: No results for input(s): AFPTM, CEA, CA199,  CHROMGRNA in the last 8760 hours.  Assessment and Plan: 47 y.o. male with multiple medical problems including  heart failure, coronary artery disease with prior CABG, diabetes with peripheral neuropathy, GERD, osteomyelitis of the left foot with prior left fourth and fifth ray amputation, hypertension, hyperlipidemia and stroke of unknown etiology in July of this year status post TPA, currently on aspirin and Plavix.  He has a history of symptomatic cholelithiasis with biliary dyskinesia and associated nausea/intermittent bile emesis and diarrhea who presents today for percutaneous cholecystostomy.  Due to recent stroke patient is not candidate for surgery at this time.Risks and benefits discussed with the patient/family including, but not limited to bleeding, infection, gallbladder perforation, bile leak, sepsis or even death.  All of the patient's questions were answered, patient is agreeable to proceed. Consent signed and in chart.  Pt may require overnight hospital stay depending upon postprocedure status; will consult [facility] for admission if needed.   Thank you for this interesting consult.  I greatly enjoyed meeting Travis [name] and look forward to participating in their care.  A copy of this report was sent to the requesting provider on this date.  LABS PENDING    Electronically Signed: D. Jeananne Rama, PA-C 09/05/2018, 10:17 AM   I spent a total of 25 minutes    in face to face in clinical consultation, greater than 50% of which was counseling/coordinating care for percutaneous cholecystostomy

## 2018-09-06 DIAGNOSIS — Z434 Encounter for attention to other artificial openings of digestive tract: Secondary | ICD-10-CM | POA: Diagnosis not present

## 2018-09-06 DIAGNOSIS — K8020 Calculus of gallbladder without cholecystitis without obstruction: Secondary | ICD-10-CM | POA: Diagnosis not present

## 2018-09-06 LAB — COMPREHENSIVE METABOLIC PANEL
ALK PHOS: 47 U/L (ref 38–126)
ALT: 15 U/L (ref 0–44)
AST: 16 U/L (ref 15–41)
Albumin: 3.6 g/dL (ref 3.5–5.0)
Anion gap: 9 (ref 5–15)
BILIRUBIN TOTAL: 1.6 mg/dL — AB (ref 0.3–1.2)
BUN: 11 mg/dL (ref 6–20)
CALCIUM: 8.9 mg/dL (ref 8.9–10.3)
CHLORIDE: 103 mmol/L (ref 98–111)
CO2: 25 mmol/L (ref 22–32)
Creatinine, Ser: 0.95 mg/dL (ref 0.61–1.24)
GLUCOSE: 218 mg/dL — AB (ref 70–99)
Potassium: 4 mmol/L (ref 3.5–5.1)
SODIUM: 137 mmol/L (ref 135–145)
Total Protein: 6.6 g/dL (ref 6.5–8.1)

## 2018-09-06 LAB — CBC
HCT: 38.9 % — ABNORMAL LOW (ref 39.0–52.0)
HEMOGLOBIN: 13.4 g/dL (ref 13.0–17.0)
MCH: 30.1 pg (ref 26.0–34.0)
MCHC: 34.4 g/dL (ref 30.0–36.0)
MCV: 87.4 fL (ref 78.0–100.0)
Platelets: 286 10*3/uL (ref 150–400)
RBC: 4.45 MIL/uL (ref 4.22–5.81)
RDW: 13 % (ref 11.5–15.5)
WBC: 8.1 10*3/uL (ref 4.0–10.5)

## 2018-09-06 LAB — GLUCOSE, CAPILLARY
GLUCOSE-CAPILLARY: 173 mg/dL — AB (ref 70–99)
Glucose-Capillary: 223 mg/dL — ABNORMAL HIGH (ref 70–99)

## 2018-09-06 MED ORDER — ONDANSETRON HCL 4 MG PO TABS: 4.0000 mg | ORAL_TABLET | Freq: Four times a day (QID) | ORAL | 0 refills | Status: DC | PRN

## 2018-09-06 MED FILL — NORMAL SALINE FLUSH SYRINGE: 0.9 | 30 days supply | Qty: 300 | Fill #0

## 2018-09-06 NOTE — Progress Notes (Signed)
Referring Physician(s): Wilson,Eric  Supervising Physician: Irish Lack  Patient Status:  [facility] - In-pt  Chief Complaint:  Abdominal pain,cholelithiasis  Subjective: Patient doing okay today.  Has some soreness at right upper quadrant drain site, still with some occasional nausea and dry heaves.   Allergies: Patient has no known allergies.  Medications: Prior to Admission medications   Medication Sig Start Date End Date Taking? Authorizing Provider  aspirin EC 325 MG EC tablet Take 1 tablet (325 mg total) by mouth daily. 12/04/17  Yes Barrett, Erin R, PA-C  clopidogrel (PLAVIX) 75 MG tablet Take 1 tablet (75 mg total) by mouth daily. 06/28/18  Yes Layne Benton, NP  gabapentin (NEURONTIN) 800 MG tablet TAKE 1 TABLET(800 MG) BY MOUTH THREE TIMES DAILY Patient taking differently: Take 2,400 mg by mouth at bedtime.  08/17/18  Yes Wendee Beavers, DO  insulin detemir (LEVEMIR) 100 UNIT/ML injection Inject 0.2 mLs (20 Units total) into the skin at bedtime. Take 1/2 of normal dose tonight 06/27/18 for planned procedure tomorrow. Then resume normal dose Patient taking differently: Inject 20 Units into the skin at bedtime. Take 20 units at qhs 06/27/18  Yes Biby, Jani Files, NP  losartan (COZAAR) 25 MG tablet Take 12.5 mg by mouth daily.  07/12/18  Yes [provider]  metoCLOPramide (REGLAN) 10 MG tablet Take 1 tablet (10 mg total) by mouth 4 (four) times daily -  before meals and at bedtime. 04/12/17  Yes Noralee Stain, DO  metoprolol succinate (TOPROL XL) 25 MG 24 hr tablet Take 0.5 tablets (12.5 mg total) by mouth daily. 01/23/18  Yes Barrett, Joline Salt, PA-C  ranitidine (ZANTAC) 150 MG tablet Take 1 tablet 2 (two) times daily by mouth. 10/01/17  Yes [provider]  rosuvastatin (CRESTOR) 40 MG tablet Take 1 tablet (40 mg total) by mouth daily. 09/14/17 06/25/25 Yes Chilton Si, MD  Ferrous Sulfate (IRON) 325 (65 Fe) MG TABS Take 1 tablet (325 mg total) by mouth 2  (two) times daily after a meal. 12/04/17   Barrett, Erin R, PA-C     Vital Signs: BP (!) 150/81   Pulse 76   Temp 98.2 F (36.8 C) (Oral)   Resp 15   Ht 6\' 1"  (1.854 m)   Wt 220 lb 0.3 oz (99.8 kg)   SpO2 100%   BMI 29.03 kg/m   Physical Exam gallbladder drain intact, insertion site clean and dry, output 50 cc green bile, drain flushes without difficulty  Imaging: Ir Perc Cholecystostomy  Result Date: 09/05/2018 INDICATION: History of highly symptomatic biliary dyskinesia though patient is a poor operative candidate given recent stroke and inability to stop his Plavix and aspirin. As such, request made for placement of an image guided cholecystostomy tube for symptomatic control prior to (hopefully) ultimately undergoing cholecystectomy to be performed approximately 6 months following recovery from his stroke. EXAM: ULTRASOUND AND FLUOROSCOPIC-GUIDED CHOLECYSTOSTOMY TUBE PLACEMENT COMPARISON:  Right upper quadrant abdominal ultrasound - 08/11/2018; nuclear medicine HIDA scan - 07/13/2017; CT abdomen and pelvis - 04/10/2017 MEDICATIONS: The patient is currently admitted to the hospital and on intravenous antibiotics. Antibiotics were administered within an appropriate time frame prior to skin puncture. ANESTHESIA/SEDATION: Moderate (conscious) sedation was employed during this procedure. A total of Versed 3 mg and Fentanyl 150 mcg was administered intravenously. Moderate Sedation Time: 27 minutes. The patient's level of consciousness and vital signs were monitored continuously by radiology nursing throughout the procedure under my direct supervision. CONTRAST:  20 cc Isovue-300 -  administered into the gallbladder fossa. FLUOROSCOPY TIME:  2 minutes, 30 seconds (88 mGy) COMPLICATIONS: None immediate. PROCEDURE: Informed written consent was obtained from the patient after a discussion of the risks, benefits and alternatives to treatment. Questions regarding the procedure were encouraged and  answered. A timeout was performed prior to the initiation of the procedure. The right upper abdominal quadrant was prepped and draped in the usual sterile fashion, and a sterile drape was applied covering the operative field. Maximum barrier sterile technique with sterile gowns and gloves were used for the procedure. A timeout was performed prior to the initiation of the procedure. Local anesthesia was provided with 1% lidocaine with epinephrine. Ultrasound scanning of the right upper quadrant demonstrates a dilated gallbladder again noted to be filled with multiple echogenic shadowing stones and biliary sludge. Utilizing a transhepatic approach, a 22 gauge needle was advanced into the gallbladder under direct ultrasound guidance. An ultrasound image was saved for documentation purposes. Appropriate intraluminal puncture was confirmed with the efflux of bile and advancement of an 0.018 wire into the gallbladder lumen. The needle was exchanged for an Accustick set. A small amount of contrast was injected to confirm appropriate intraluminal positioning. Over a Benson wire, a 10.2-French Cook cholecystomy tube was advanced into the gallbladder fossa, coiled and locked. Bile was aspirated and a small amount of contrast was injected as several post procedural spot radiographic images were obtained in various obliquities. The catheter was secured to the skin with suture, connected to a drainage bag and a dressing was placed. The patient tolerated the procedure well without immediate post procedural complication. IMPRESSION: Successful ultrasound and fluoroscopic guided placement of a 10.2 French cholecystostomy tube. PLAN: - Assuming patient's symptoms are improved following cholecystectomy tube placement and if interval cholecystectomy is not pursued, would recommend fluoroscopic guided exchange and up sizing in approximately 2 months. - If patient's symptoms are NOT improved following cholecystostomy tube placement,  consideration for removal could be performed in a minimal 6 weeks pending discussion with Dr. Andrey Campanile. Electronically Signed   By: Simonne Come M.D.   On: 09/05/2018 13:09    Labs:  CBC: Recent Labs    12/18/17 0256 06/25/18 0404 06/25/18 0409 09/05/18 0951 09/06/18 0422  WBC 11.7* 11.7*  --  7.7 8.1  HGB 9.3* 13.9 13.9 14.6 13.4  HCT 29.5* 42.2 41.0 42.1 38.9*  PLT 441* 249  --  320 286    COAGS: Recent Labs    11/28/17 0931 11/29/17 1330 06/25/18 0404 09/05/18 0951  INR 1.06 1.47 1.12 1.01  APTT 33 32 30 32    BMP: Recent Labs    01/23/18 0913 06/25/18 0404 06/25/18 0409 09/05/18 0951 09/06/18 0422  NA 137 140 139 138 137  K 4.8 4.2 4.1 4.2 4.0  CL 99 105 104 103 103  CO2 24 24  --  27 25  GLUCOSE 169* 141* 141* 219* 218*  BUN 13 13 15 14 11   CALCIUM 9.3 9.5  --  9.4 8.9  CREATININE 0.99 1.08 1.00 0.90 0.95  GFRNONAA 91 >60  --  >60 >60  GFRAA 105 >60  --  >60 >60    LIVER FUNCTION TESTS: Recent Labs    01/23/18 0913 06/25/18 0404 09/05/18 0951 09/06/18 0422  BILITOT 0.5 1.0 1.2 1.6*  AST 14 22 16 16   ALT 17 16 13 15   ALKPHOS 65 64 49 47  PROT 6.2 7.4 7.2 6.6  ALBUMIN 3.9 4.0 4.1 3.6    Assessment and Plan:  Patient with history of symptomatic biliary dyskinesia, cholelithiasis, recent stroke on DAPT; status post percutaneous cholecystostomy on 9/24; temp 99. 1, WBC normal, hemoglobin normal, creatinine normal, T bilirubin 1.6; as outpatient recommend once daily flushing of gallbladder drain with 5 cc sterile normal saline, output recording and dressing changes every 1 to 2 days. Assuming patient's symptoms are improved following cholecystostomy tube placement and if interval cholecystectomy is not pursued, would recommend fluoroscopic guided exchange and up sizing in approximately 2 months.  - If patient's symptoms are NOT improved following cholecystostomy tube placement, consideration for removal could be performed in a minimal 6 weeks  pending discussion with Dr. Andrey Campanile  Patient given prescription for sterile normal saline flushes and output recording cards. Call [phone] or [phone] with any drain questions  Electronically Signed: D. Jeananne Rama, PA-C 09/06/2018, 10:52 AM   I spent a total of 20 minutes at the the patient's bedside AND on the patient's hospital floor or unit, greater than 50% of which was counseling/coordinating care for gallbladder drain    Patient ID: Travis [name], male   DOB: Jul 25, 1971, 47 y.o.   MRN: [id number]

## 2018-09-06 NOTE — Discharge Summary (Signed)
Physician Discharge Summary  Travis [name] ZOX:[id number] DOB: 10-Feb-1971 DOA: 09/05/2018  PCP: Wendee Beavers, DO  Admit date: 09/05/2018 Discharge date: 09/06/2018  Admitted From: Home  Disposition: HOME.   Recommendations for Outpatient Follow-up:  1. Follow up with PCP in 1-2 weeks 2. Please obtain BMP/CBC in one week   Home Health: yes   Discharge Condition:stable.  CODE STATUS: FULL CODE.  Diet recommendation: Heart Healthy    Brief/Interim Summary: Travis [name] is a 47 y.o. 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  He was diagnosed with cholecystitis however felt not to be a good surgical candidate, and was referred by general surgery to IR for cholecystostomy which she underwent today.  We are asked to admit and observe for 24 hours post procedure given complex medical patient as well as dual antiplatelet therapy, pain control.  Discharge Diagnoses:  Active Problems:   Cholecystostomy care [facility])   Cholecystitis status post cholecystostomy -Not a surgical candidate given multiple comorbidities as well as requirement for dual antiplatelet therapy in the setting of a recent stroke,. - cholecystostomy drain placed. IR to give recommendations on discharge.   -Continue his home Reglan  Insulin-dependent diabetes mellitus -Continue lower dose Levemir as well as sliding scale insulin,.  Coronary artery disease status post CABG -No chest pain, this appears stable, continue DAPT  Previous CVA -No significant residual deficits, continue dual antiplatelet  therapy  Hypertension -Continue beta-blockers  Hyperlipidemia -continue statin    Chronic systolic CHF -In the setting of ischemic cardiomyopathy, most recent 2D echo was done in July 2019 and showed an ejection fraction of 35-40% -He appears euvolemic on exam no peripheral edema, continue to monitor, he is not on any diuretics at home.    Discharge Instructions  Discharge Instructions    Diet - low sodium heart healthy   Complete by:  As directed    Discharge instructions   Complete by:  As directed    Follow up with IR as recommended.  Please follow up with PCP in one week.     Allergies as of 09/06/2018   No Known Allergies     Medication List    TAKE these medications   aspirin 325 MG EC tablet Take 1 tablet (325 mg total) by mouth daily.   clopidogrel 75 MG tablet Commonly known as:  PLAVIX Take 1 tablet (75 mg total) by mouth daily.   gabapentin 800 MG tablet Commonly known as:  NEURONTIN TAKE 1 TABLET(800 MG) BY MOUTH THREE TIMES DAILY What changed:  See the new instructions.   insulin detemir 100 UNIT/ML injection Commonly known as:  LEVEMIR Inject 0.2 mLs (20 Units total) into the skin at bedtime. Take 1/2 of normal dose tonight 06/27/18 for planned procedure tomorrow. Then resume normal dose What changed:  additional instructions   Iron 325 (65 Fe) MG Tabs Take 1 tablet (325 mg total) by mouth 2 (two) times daily after a meal.   losartan 25 MG tablet Commonly known as:  COZAAR Take 12.5 mg by mouth daily.   metoCLOPramide 10 MG tablet Commonly known as:  REGLAN Take 1 tablet (10 mg total) by mouth 4 (four) times  daily -  before meals and at bedtime.   metoprolol succinate 25 MG 24 hr tablet Commonly known as:  TOPROL-XL Take 0.5 tablets (12.5 mg total) by mouth daily.   ondansetron 4 MG tablet Commonly known as:  ZOFRAN Take 1 tablet (4 mg total) by mouth every 6 (six) hours as needed for nausea.   ranitidine 150 MG tablet Commonly  known as:  ZANTAC Take 1 tablet 2 (two) times daily by mouth.   rosuvastatin 40 MG tablet Commonly known as:  CRESTOR Take 1 tablet (40 mg total) by mouth daily.      Follow-up Information    Wendee Beavers, DO. Schedule an appointment as soon as possible for a visit in 1 week(s).   Specialty:  Family Medicine Contact information: [address] [place] [address] [phone]        Chilton Si, MD .   Specialty:  Cardiology Contact information: [address] [place] [address] [phone]        [facility], [facility] Follow up.   Specialty:  Home Health Services Why:  [facility] nursing Contact information: [address] [place] [address] [phone]          No Known Allergies  Consultations:  IR.    Procedures/Studies: Ir Perc Cholecystostomy  Result Date: 09/05/2018 INDICATION: History of highly symptomatic biliary dyskinesia though patient is a poor operative candidate given recent stroke and inability to stop his Plavix and aspirin. As such, request made for placement of an image guided cholecystostomy tube for symptomatic control prior to (hopefully) ultimately undergoing cholecystectomy to be performed approximately 6 months following recovery from his stroke. EXAM: ULTRASOUND AND FLUOROSCOPIC-GUIDED CHOLECYSTOSTOMY TUBE PLACEMENT COMPARISON:  Right upper quadrant abdominal ultrasound - 08/11/2018; nuclear medicine HIDA scan - 07/13/2017; CT abdomen and pelvis - 04/10/2017 MEDICATIONS: The patient is currently admitted to the hospital and on intravenous antibiotics. Antibiotics were administered within an appropriate time frame prior to skin puncture. ANESTHESIA/SEDATION: Moderate (conscious) sedation was employed during this procedure. A total of Versed 3 mg and Fentanyl 150 mcg was administered intravenously. Moderate Sedation Time: 27 minutes. The patient's level of consciousness and vital signs were monitored  continuously by radiology nursing throughout the procedure under my direct supervision. CONTRAST:  20 cc Isovue-300 - administered into the gallbladder fossa. FLUOROSCOPY TIME:  2 minutes, 30 seconds (88 mGy) COMPLICATIONS: None immediate. PROCEDURE: Informed written consent was obtained from the patient after a discussion of the risks, benefits and alternatives to treatment. Questions regarding the procedure were encouraged and answered. A timeout was performed prior to the initiation of the procedure. The right upper abdominal quadrant was prepped and draped in the usual sterile fashion, and a sterile drape was applied covering the operative field. Maximum barrier sterile technique with sterile gowns and gloves were used for the procedure. A timeout was performed prior to the initiation of the procedure. Local anesthesia was provided with 1% lidocaine with epinephrine. Ultrasound scanning of the right upper quadrant demonstrates a dilated gallbladder again noted to be filled with multiple echogenic shadowing stones and biliary sludge. Utilizing a transhepatic approach, a 22 gauge needle was advanced into the gallbladder under direct ultrasound guidance. An ultrasound image was saved for documentation purposes. Appropriate intraluminal puncture was confirmed with the efflux of bile and advancement of an 0.018 wire into the gallbladder lumen. The needle was exchanged for an Accustick set. A small amount of contrast was injected to confirm appropriate intraluminal positioning. Over a  Benson wire, a 10.2-French Cook cholecystomy tube was advanced into the gallbladder fossa, coiled and locked. Bile was aspirated and a small amount of contrast was injected as several post procedural spot radiographic images were obtained in various obliquities. The catheter was secured to the skin with suture, connected to a drainage bag and a dressing was placed. The patient tolerated the procedure well without immediate post  procedural complication. IMPRESSION: Successful ultrasound and fluoroscopic guided placement of a 10.2 French cholecystostomy tube. PLAN: - Assuming patient's symptoms are improved following cholecystectomy tube placement and if interval cholecystectomy is not pursued, would recommend fluoroscopic guided exchange and up sizing in approximately 2 months. - If patient's symptoms are NOT improved following cholecystostomy tube placement, consideration for removal could be performed in a minimal 6 weeks pending discussion with Dr. Andrey Campanile. Electronically Signed   By: Simonne Come M.D.   On: 09/05/2018 13:09   US Abdomen Limited Ruq  Result Date: 08/11/2018 CLINICAL DATA:  Biliary dyskinesia. EXAM: ULTRASOUND ABDOMEN LIMITED RIGHT UPPER QUADRANT COMPARISON:  Ultrasound of July 07, 2017. FINDINGS: Gallbladder: Cholelithiasis is noted without gallbladder wall thickening or pericholecystic fluid. No sonographic Murphy's sign is noted. Sludge is present as well. Common bile duct: Diameter: 4 mm which is within normal limits. Liver: No focal lesion identified. Within normal limits in parenchymal echogenicity. Portal vein is patent on color Doppler imaging with normal direction of blood flow towards the liver. IMPRESSION: Cholelithiasis and sludge is noted within gallbladder lumen, but no evidence of cholecystitis is noted. No other abnormality seen in the right upper quadrant of the abdomen. Electronically Signed   By: Lupita Raider, M.D.   On: 08/11/2018 14:25       Subjective: No chest pain.   Discharge Exam: Vitals:   09/05/18 1229 09/05/18 1347  BP: (!) 150/81   Pulse: 76   Resp: 16 15  Temp: 98.2 F (36.8 C)   SpO2: 100%    Vitals:   09/05/18 1136 09/05/18 1205 09/05/18 1229 09/05/18 1347  BP: (!) 166/78 (!) 154/75 (!) 150/81   Pulse: 76 74 76   Resp: 20 16 16 15   Temp: 98.3 F (36.8 C) 98.3 F (36.8 C) 98.2 F (36.8 C)   TempSrc: Oral Oral Oral   SpO2: 100% 100% 100%   Weight:    99.8  kg  Height:    6\' 1"  (1.854 m)    General: Pt is alert, awake, not in acute distress Cardiovascular: RRR, S1/S2 +, no rubs, no gallops Respiratory: CTA bilaterally, no wheezing, no rhonchi Abdominal: Soft, NT, ND, bowel sounds + Extremities: no edema, no cyanosis    The results of significant diagnostics from this hospitalization (including imaging, microbiology, ancillary and laboratory) are listed below for reference.     Microbiology: No results found for this or any previous visit (from the past 240 hour(s)).   Labs: BNP (last 3 results) No results for input(s): BNP in the last 8760 hours. Basic Metabolic Panel: Recent Labs  Lab 09/05/18 0951 09/06/18 0422  NA 138 137  K 4.2 4.0  CL 103 103  CO2 27 25  GLUCOSE 219* 218*  BUN 14 11  CREATININE 0.90 0.95  CALCIUM 9.4 8.9   Liver Function Tests: Recent Labs  Lab 09/05/18 0951 09/06/18 0422  AST 16 16  ALT 13 15  ALKPHOS 49 47  BILITOT 1.2 1.6*  PROT 7.2 6.6  ALBUMIN 4.1 3.6   No results for input(s): LIPASE, AMYLASE in the last 168  hours. No results for input(s): AMMONIA in the last 168 hours. CBC: Recent Labs  Lab 09/05/18 0951 09/06/18 0422  WBC 7.7 8.1  HGB 14.6 13.4  HCT 42.1 38.9*  MCV 87.2 87.4  PLT 320 286   Cardiac Enzymes: No results for input(s): CKTOTAL, CKMB, CKMBINDEX, TROPONINI in the last 168 hours. BNP: Invalid input(s): POCBNP CBG: Recent Labs  Lab 09/05/18 1136 09/05/18 1617 09/05/18 2055 09/06/18 0733 09/06/18 1114  GLUCAP 239* 204* 219* 173* 223*   D-Dimer No results for input(s): DDIMER in the last 72 hours. Hgb A1c No results for input(s): HGBA1C in the last 72 hours. Lipid Profile No results for input(s): CHOL, HDL, LDLCALC, TRIG, CHOLHDL, LDLDIRECT in the last 72 hours. Thyroid function studies No results for input(s): TSH, T4TOTAL, T3FREE, THYROIDAB in the last 72 hours.  Invalid input(s): FREET3 Anemia work up No results for input(s): VITAMINB12, FOLATE,  FERRITIN, TIBC, IRON, RETICCTPCT in the last 72 hours. Urinalysis    Component Value Date/Time   COLORURINE YELLOW 06/25/2018 0730   APPEARANCEUR CLEAR 06/25/2018 0730   LABSPEC 1.031 (H) 06/25/2018 0730   PHURINE 5.0 06/25/2018 0730   GLUCOSEU 150 (A) 06/25/2018 0730   HGBUR SMALL (A) 06/25/2018 0730   HGBUR negative 09/17/2009 0834   BILIRUBINUR NEGATIVE 06/25/2018 0730   KETONESUR 20 (A) 06/25/2018 0730   PROTEINUR 30 (A) 06/25/2018 0730   UROBILINOGEN 1.0 04/30/2016 1338   NITRITE NEGATIVE 06/25/2018 0730   LEUKOCYTESUR NEGATIVE 06/25/2018 0730   Sepsis Labs Invalid input(s): PROCALCITONIN,  WBC,  LACTICIDVEN Microbiology No results found for this or any previous visit (from the past 240 hour(s)).   Time coordinating discharge: 32 minutes  SIGNED:   Kathlen Mody, MD  [facility] 09/06/2018, 4:51 PM Pager   If 7PM-7AM, please contact night-coverage [email or web address] Password [email or web address]

## 2018-09-06 NOTE — Care Management Note (Signed)
Case Management Note  Patient Details  Name: Travis [name] MRN: [id number] Date of Birth: 11/01/1971  Subjective/Objective:Admitted w/ cholecystostomy care. From home. HHRN ordered for drain mgmnt-[facility] chosen for HHC-rep Karen aware of d/c & HHRN order. Informed patient/sister that HHC intermittent services-teaching of drain mgmnt,also informed that if someone is needed for doing dsg change or emptying drain daily-custodial level care is not covered by insurance, & private duty care is needed-they can find personal care services through google,calling,[employer]-they voiced understanding. [facility] is sister-[name] [phone]_[facility] is aware). No further CM needs.                    Action/Plan:d/c home w/HHC.   Expected Discharge Date:  09/06/18               Expected Discharge Plan:  Home w Home Health Services  In-House Referral:     Discharge planning Services  CM Consult  Post Acute Care Choice:    Choice offered to:  Patient  DME Arranged:    DME Agency:     HH Arranged:  RN HH Agency:  [facility]  Status of Service:  Completed, signed off  If discussed at Long Length of Stay Meetings, dates discussed:    Additional Comments:  Lanier ClamMahabir, Milfred Krammes, RN 09/06/2018, 1:32 PM

## 2018-09-07 ENCOUNTER — Telehealth: Payer: Self-pay

## 2018-09-07 ENCOUNTER — Other Ambulatory Visit: Payer: Self-pay | Admitting: Family Medicine

## 2018-09-07 MED ORDER — ONDANSETRON HCL 4 MG PO TABS: 4.0000 mg | ORAL_TABLET | Freq: Four times a day (QID) | ORAL | 0 refills | Status: DC | PRN

## 2018-09-07 NOTE — Telephone Encounter (Signed)
Byrd Hesselbach, RN with [facility], called for verbal orders for nursing for education and family support 2x/week x 2 weeks and 1x/week x 1 week.  Call back is [phone]  Ples Specter, RN [facility] RN)

## 2018-09-09 IMAGING — US US ABDOMEN COMPLETE
1 series · 13 of 25 positions shown · non-contrast
Comparison: 04/10/2017 CT abdomen.

CLINICAL DATA: Weight loss with nausea and vomiting. Gastroparesis.

EXAM:
ABDOMEN ULTRASOUND COMPLETE

[Series 1: us abdomen complete · 0.23mm/px · 13 of 114 slices shown]
[im 1/114]
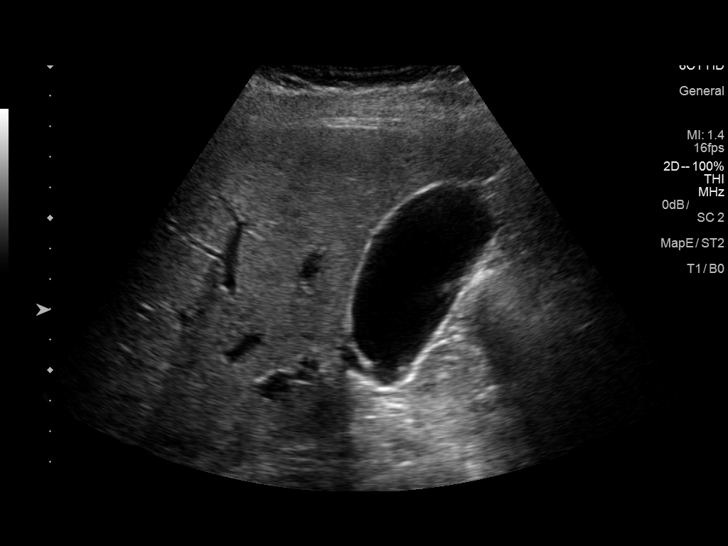
[im 10/114]
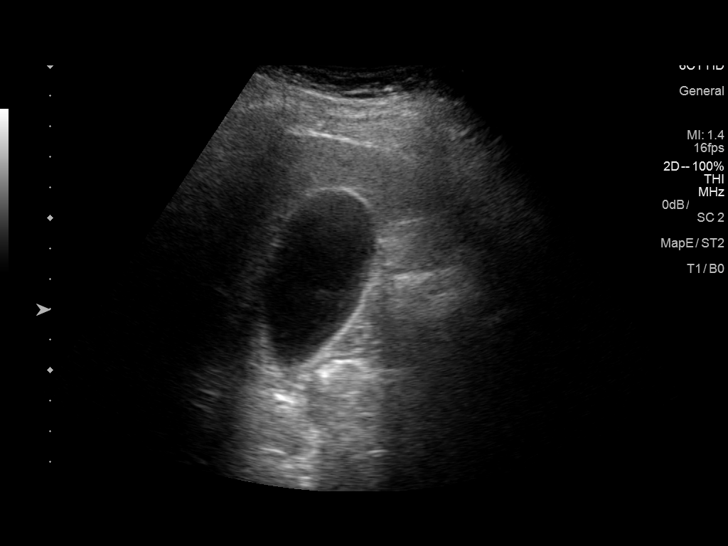
[im 19/114]
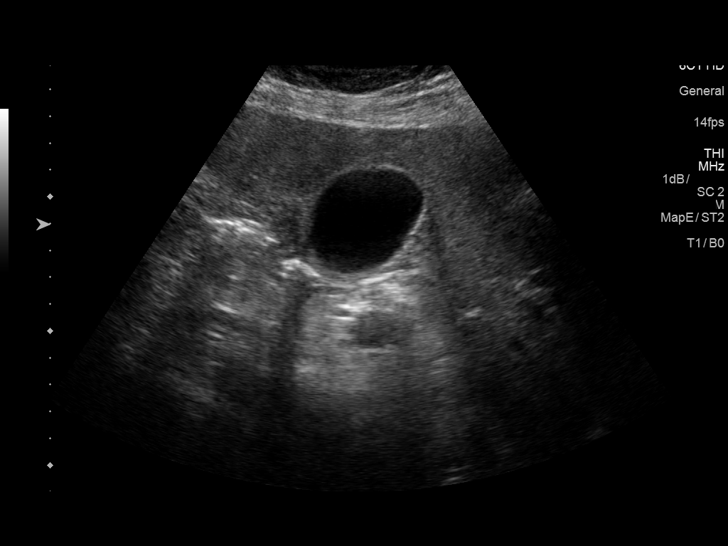
[im 29/114]
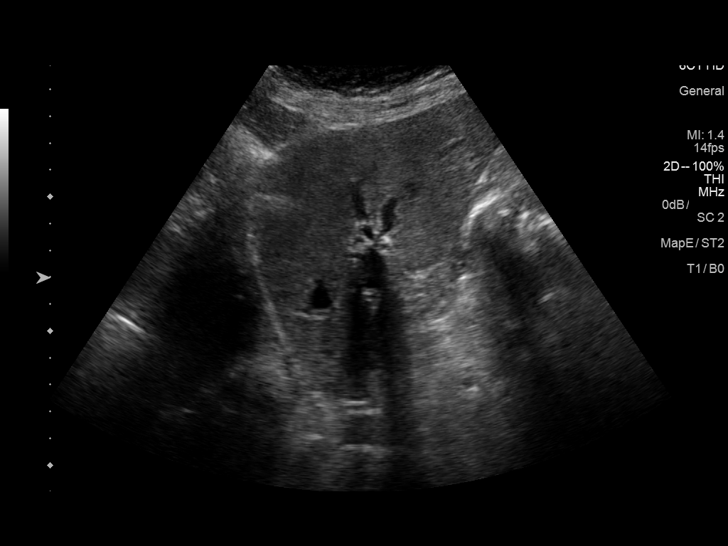
[im 38/114]
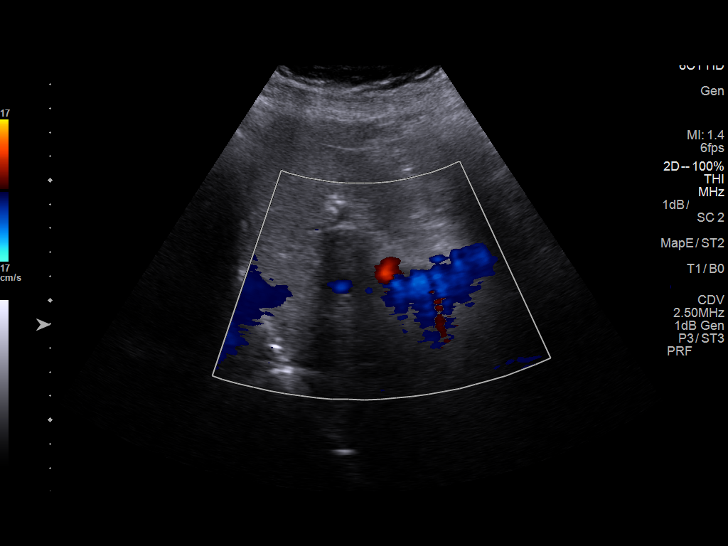
[im 48/114]
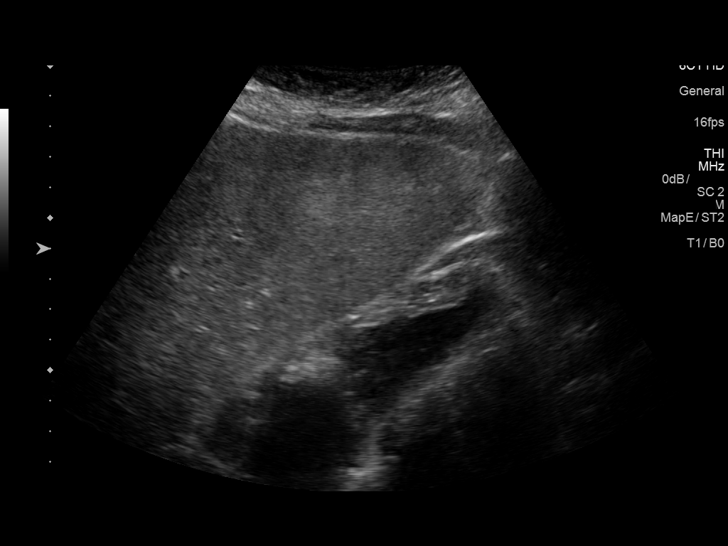
[im 57/114]
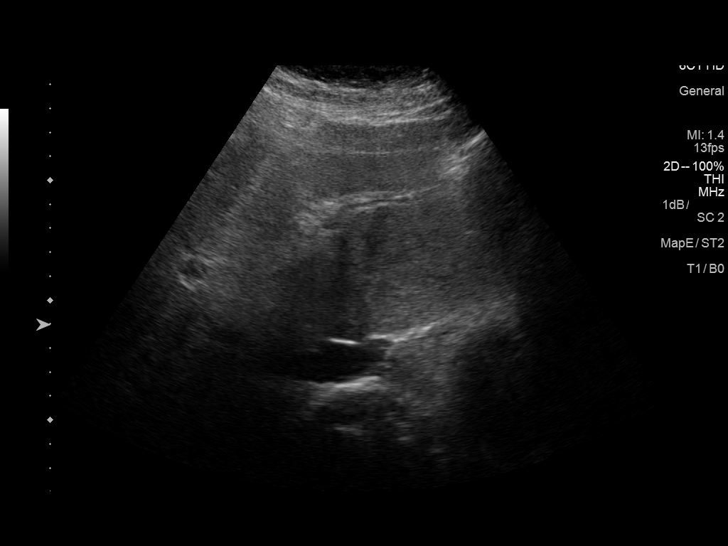
[im 66/114]
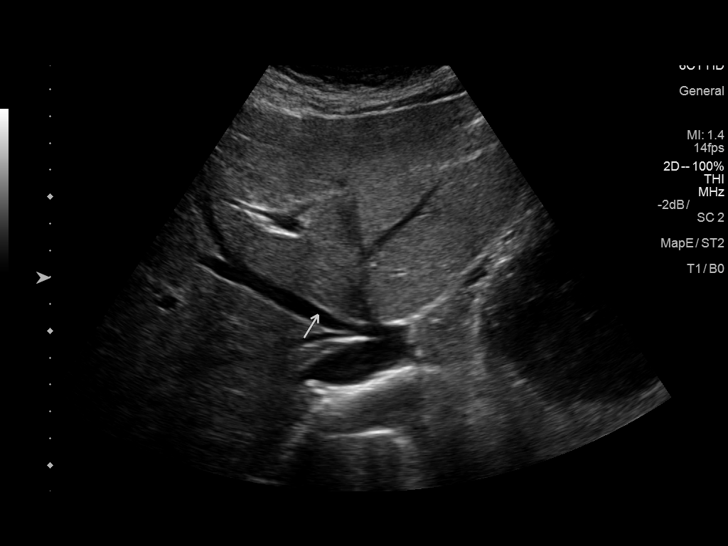
[im 76/114]
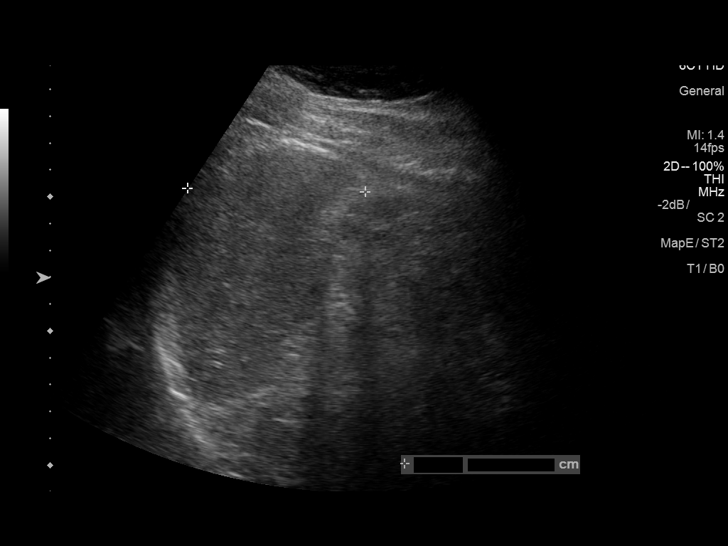
[im 85/114]
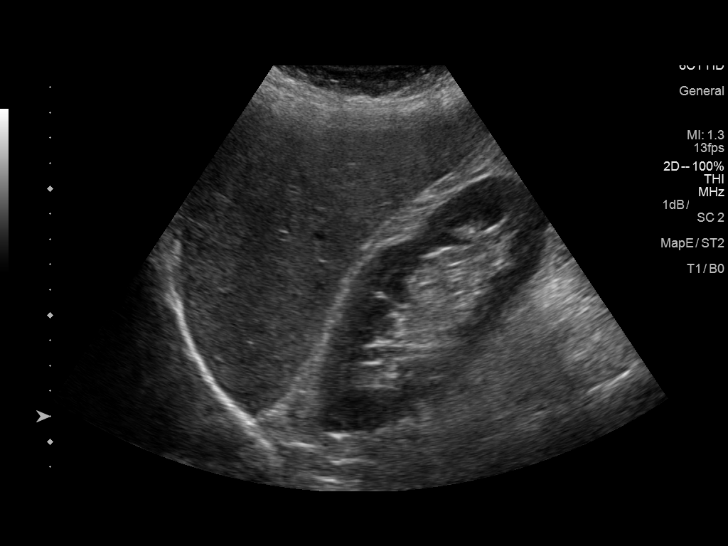
[im 95/114]
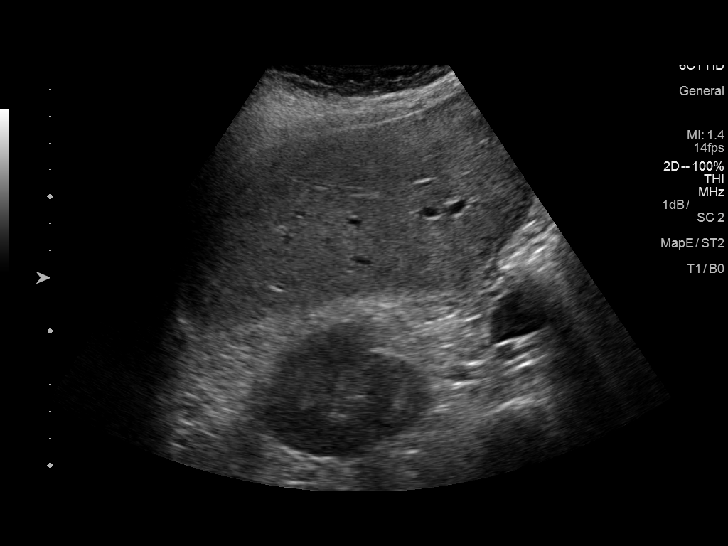
[im 104/114]
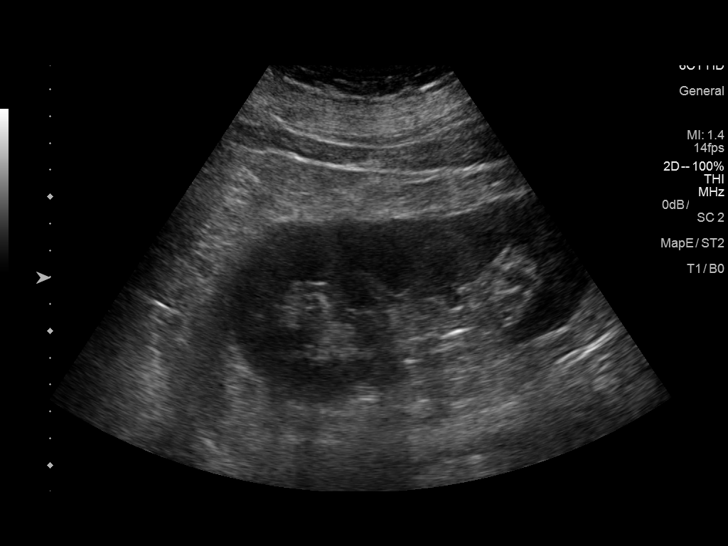
[im 114/114]
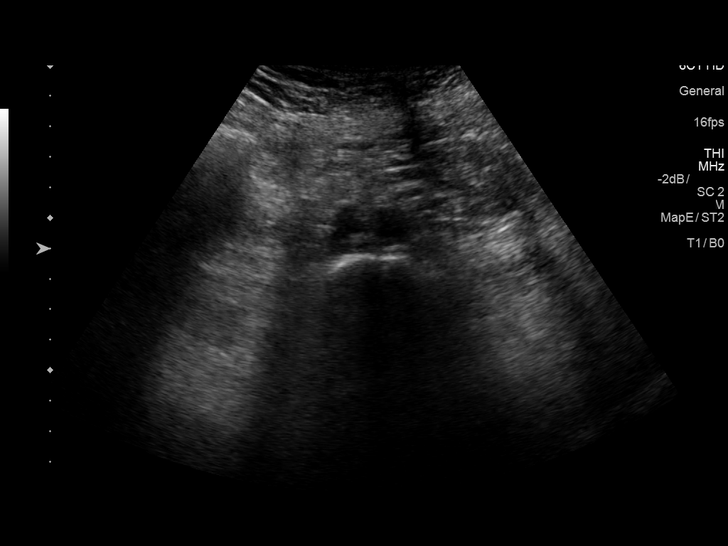

[13 of 25 positions shown; findings below may reference images not displayed]

FINDINGS: Gallbladder: Echogenic material flows within the lumen of the
gallbladder. Sludge, mixed with small calculi are suspected. These
suspected stones are only a few mm in diameter. No pericholecystic
fluid. There is no gallbladder wall thickening and no sonographic
Murphy's sign.

Common bile duct: Diameter: 4.8 mm

Liver: No focal lesion identified. Within normal limits in
parenchymal echogenicity.

IVC: No abnormality visualized.

Pancreas: Visualized portion unremarkable.

Spleen: Size and appearance within normal limits.

Right Kidney: Length: 12.7 cm. Echogenicity within normal limits. No
mass or hydronephrosis visualized.

Left Kidney: Length: 13.4 cm. Echogenicity within normal limits. No
mass or hydronephrosis visualized.

Abdominal aorta: No aneurysm visualized.

Other findings: None.
IMPRESSION: Echogenic sludge and/or small stones within the gallbladder
consistent with cholelithiasis. No signs of acute cholecystitis such
as RIGHT upper quadrant tenderness or gallbladder wall thickening.
No pericholecystic fluid.

## 2018-09-11 LAB — CUP PACEART REMOTE DEVICE CHECK
Date Time Interrogation Session: 20190921161040
MDC IDC PG IMPLANT DT: 20190717

## 2018-09-13 ENCOUNTER — Ambulatory Visit (INDEPENDENT_AMBULATORY_CARE_PROVIDER_SITE_OTHER): Payer: Medicare Other | Admitting: Family Medicine

## 2018-09-13 VITALS — BP 115/70 | HR 69 | Temp 97.9°F | Ht 73.0 in | Wt 225.2 lb

## 2018-09-13 DIAGNOSIS — Z434 Encounter for attention to other artificial openings of digestive tract: Secondary | ICD-10-CM | POA: Diagnosis not present

## 2018-09-13 DIAGNOSIS — Z794 Long term (current) use of insulin: Secondary | ICD-10-CM

## 2018-09-13 DIAGNOSIS — F330 Major depressive disorder, recurrent, mild: Secondary | ICD-10-CM | POA: Diagnosis not present

## 2018-09-13 DIAGNOSIS — E1143 Type 2 diabetes mellitus with diabetic autonomic (poly)neuropathy: Secondary | ICD-10-CM | POA: Diagnosis not present

## 2018-09-13 MED ORDER — ONETOUCH VERIO W/DEVICE KIT: 1.0000 "application " | PACK | Freq: Four times a day (QID) | 0 refills | Status: DC

## 2018-09-13 MED ORDER — METOCLOPRAMIDE HCL 10 MG PO TABS: 10.0000 mg | ORAL_TABLET | Freq: Three times a day (TID) | ORAL | 1 refills | Status: DC

## 2018-09-13 NOTE — Telephone Encounter (Signed)
Called today. Thank you.  Durward Parcel, DO [facility], PGY-3

## 2018-09-13 NOTE — Patient Instructions (Signed)
Thank you for coming in to see [place] today. Please see below to review our plan for today's visit.  1.  I have called in a refill for your Reglan.  Discontinue the Zofran. 2.  Continue checking your fasting glucose levels prior to eating breakfast and approximately 2 hours after meals with your new glucometer. 3.  If you reconsider seeing behavioral therapy or are interested in guarding depression medication, please let me know. 4.  I would contact the surgeon to ask if you could shower otherwise keep the dressings dry.  Please call the clinic at [phone] if your symptoms worsen or you have any concerns. It was our pleasure to serve you.  Durward Parcel, DO [facility], PGY-3

## 2018-09-13 NOTE — Progress Notes (Signed)
Subjective   Patient ID: BRENTT [name]    DOB: [birth year], 47 y.o. male   MRN: [id number]  CC: "Need medication refill"  HPI: Travis [name] is a 47 y.o. male who presents to clinic today for the following:  Cholecystostomy: Patient underwent elective cholecystostomy with interventional radiology after having symptomatic cholelithiasis.  Given his use of Plavix for extensive cardiovascular disease, decision was made to proceed with cholecystostomy.  Patient continues to have bilious drainage and feels much better since discharge.  He denies fevers or chills, nausea or vomiting, abdominal pain, shortness of breath or chest pain.  He has follow-up in 1 month.  Diabetes with gastroparesis: Patient requesting glucometer to be replaced after not working for the last several months.  His CBGs have been averaging in the mid to low 200s during his hospitalization last week.  He continues to take Levemir 20 units daily.  He does need a refill of his Reglan after being discharged from the hospital for cholecystotomy.  He was given some Zofran but has not taken the medication because "it does not work."   Depression: Patient has depressive feelings for the last several years.  This has been discussed at his last visit back in March.  Patient was on interested in medication therapy and continues to be adamant about not starting "more medications."  He did see behavioral therapy once but did not feel that this was helpful at the time.  He has no history of suicidal or homicidal ideation.  No previous medical therapy for depression in the past.  ROS: see HPI for pertinent.  [place]: CAD (s/p CABG 11/2018, on ASA) and HFrEF (26%, 07/2016), IDDM with gastroparesis, neuropathy, ED, and h/o DKA, h/o osteomyelitis of L-foot, OA with vit-D deficiency. 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 Family history COPD, heart failure,  esophogeal cancer (fatehr), fibromyalgia, DM. Smoking status reviewed. Medications reviewed.  Objective   BP 115/70   Pulse 69   Temp 97.9 F (36.6 C) (Oral)   Ht '6\' 1"'  (1.854 m)   Wt 225 lb 3.2 oz (102.2 kg)   SpO2 98%   BMI 29.71 kg/m  Vitals and nursing note reviewed.  General: well nourished, well developed, NAD with non-toxic appearance HEENT: normocephalic, atraumatic, moist mucous membranes Neck: supple, non-tender without lymphadenopathy Cardiovascular: regular rate and rhythm without murmurs, rubs, or gallops Lungs: clear to auscultation bilaterally with normal work of breathing Abdomen: soft, non-tender, non-distended, normoactive bowel sounds, cholecystotomy tube intact without associated erythema or edema with bilious discharge without purulence or bleeding Skin: warm, dry, no rashes or lesions, cap refill < 2 seconds Extremities: warm and well perfused, normal tone, no edema Psych: euthymic mood, flat affect  Assessment & Plan   Cholecystostomy care ([place]) Tube intact. Rmains asymptomatic.  Appears to be doing well with noninvasive surgical option given history of Plavix and increased risk for bleeding. - Follow-up with IR at which point we can hopefully avoid cholecystectomy or postponed until Plavix can be discontinued  Major depressive disorder, recurrent episode, mild (HCC) Chronic.  Mild in severity, PHQ-9 score 7, somewhat difficult.  Seems to be related to extensive medical history.  Patient remains uninterested in medication or behavioral therapy.  Patient not at risk to harm self or others. 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  Last A1c controlled at 6.9.  Currently on insulin only.  Does have gastroparesis which seems to be stable.  No history of hypoglycemia but the patient has good understanding  of how to treat. - Given new glucometer - Refill Reglan 10 mg 4 times daily - RTC 1 month for next A1c and will also check CBC and CMET  No orders of the defined types were placed in this encounter.  Meds ordered this encounter  Medications  . metoCLOPramide (REGLAN) 10 MG tablet    Sig: Take 1 tablet (10 mg total) by mouth 4 (four) times daily -  before meals and at bedtime.    Dispense:  90 tablet    Refill:  1  . Blood Glucose Monitoring Suppl (ONETOUCH VERIO) w/Device KIT    Sig: 1 application by Does not apply route 4 (four) times daily.    Dispense:  1 kit    Refill:  0    Harriet Butte, [place], PGY-3 09/13/2018, 11:41 AM

## 2018-09-13 NOTE — Assessment & Plan Note (Signed)
Chronic.  Mild in severity, PHQ-9 score 7, somewhat difficult.  Seems to be related to extensive medical history.  Patient remains uninterested in medication or behavioral therapy.  Patient not at risk to harm self or others. - Advised patient to consider behavioral or medical therapy in the future

## 2018-09-13 NOTE — Assessment & Plan Note (Signed)
Chronic.  Last A1c controlled at 6.9.  Currently on insulin only.  Does have gastroparesis which seems to be stable.  No history of hypoglycemia but the patient has good understanding of how to treat. - Given new glucometer - Refill Reglan 10 mg 4 times daily - RTC 1 month for next A1c and will also check CBC and CMET

## 2018-09-13 NOTE — Assessment & Plan Note (Signed)
Tube intact. Rmains asymptomatic.  Appears to be doing well with noninvasive surgical option given history of Plavix and increased risk for bleeding. - Follow-up with IR at which point we can hopefully avoid cholecystectomy or postponed until Plavix can be discontinued

## 2018-09-15 ENCOUNTER — Telehealth: Payer: Self-pay | Admitting: *Deleted

## 2018-09-15 NOTE — Telephone Encounter (Signed)
Beth called to inform us that the pt has decided that he does not need [facility] anymore.  He thought they we going to come out and empty his drain, but insurance will only pay to have HH come and teach the caregiver to.  Pt told Waynetta Sandy that his sister cant come everyday and his children are unreliable.  Beth suggested that pt call to see if we can decrease the times he empty drain a day.    Will forward to MD. Genasis Zingale, Maryjo [place], CMA

## 2018-09-18 ENCOUNTER — Other Ambulatory Visit: Payer: Self-pay

## 2018-09-18 ENCOUNTER — Other Ambulatory Visit: Payer: Self-pay | Admitting: Family Medicine

## 2018-09-18 DIAGNOSIS — E1143 Type 2 diabetes mellitus with diabetic autonomic (poly)neuropathy: Secondary | ICD-10-CM

## 2018-09-18 DIAGNOSIS — Z794 Long term (current) use of insulin: Principal | ICD-10-CM

## 2018-09-18 MED ORDER — ONETOUCH DELICA LANCETS 33G MISC: 1.0000 "application " | Freq: Four times a day (QID) | 12 refills | Status: DC

## 2018-09-18 MED ORDER — GLUCOSE BLOOD VI STRP: 1.0000 | ORAL_STRIP | 12 refills | Status: DC | PRN

## 2018-09-18 NOTE — Telephone Encounter (Signed)
Lancets as well.

## 2018-09-18 NOTE — Telephone Encounter (Signed)
Pharmacy called nurse line requesting a rx for pts test strips. They received the rx for his meter, they just need strips. Please advise.

## 2018-09-18 NOTE — Telephone Encounter (Signed)
Rx sent for strips and lancets.  Travis Parcel, DO [facility], PGY-3

## 2018-09-18 NOTE — Telephone Encounter (Signed)
That is a question for surgery to answer. I have no problem with decreasing frequency.  Travis Parcel, DO [facility], PGY-3

## 2018-09-19 NOTE — Telephone Encounter (Signed)
Informed patient of RX for strips and lancets at pharmacy via voice mail.  Glennie Hawk, CMA

## 2018-09-21 NOTE — Telephone Encounter (Signed)
Called and left voice message for patient concerning decreasing the frequency of times he empty drains daily.  Informed patient to call back if he has any questions.  Glennie Hawk, CMA

## 2018-09-22 ENCOUNTER — Encounter: Payer: Self-pay | Admitting: Cardiovascular Disease

## 2018-09-22 ENCOUNTER — Ambulatory Visit (INDEPENDENT_AMBULATORY_CARE_PROVIDER_SITE_OTHER): Payer: Medicare Other | Admitting: Cardiovascular Disease

## 2018-09-22 VITALS — BP 128/66 | HR 67 | Ht 74.0 in | Wt 225.0 lb

## 2018-09-22 DIAGNOSIS — Z5181 Encounter for therapeutic drug level monitoring: Secondary | ICD-10-CM | POA: Diagnosis not present

## 2018-09-22 DIAGNOSIS — Z951 Presence of aortocoronary bypass graft: Secondary | ICD-10-CM | POA: Diagnosis not present

## 2018-09-22 DIAGNOSIS — I5042 Chronic combined systolic (congestive) and diastolic (congestive) heart failure: Secondary | ICD-10-CM

## 2018-09-22 DIAGNOSIS — E7800 Pure hypercholesterolemia, unspecified: Secondary | ICD-10-CM | POA: Diagnosis not present

## 2018-09-22 MED ORDER — FUROSEMIDE 20 MG PO TABS: 20.0000 mg | ORAL_TABLET | Freq: Every day | ORAL | 3 refills | Status: DC

## 2018-09-22 NOTE — Patient Instructions (Signed)
Medication Instructions:  START FUROSEMIDE 20 MG DAILY   If you need a refill on your cardiac medications before your next appointment, please call your pharmacy.   Lab work: BMET 1 WEEK If you have labs (blood work) drawn today and your tests are completely normal, you will receive your results only by: Marland Kitchen MyChart Message (if you have MyChart) OR . A paper copy in the mail If you have any lab test that is abnormal or we need to change your treatment, we will call you to review the results.  Testing/Procedures: NONE  Follow-Up: At [facility], you and your health needs are our priority.  As part of our continuing mission to provide you with exceptional heart care, we have created designated Provider Care Teams.  These Care Teams include your primary Cardiologist (physician) and Advanced Practice Providers (APPs -  Physician Assistants and Nurse Practitioners) who all work together to provide you with the care you need, when you need it. You will need a follow up appointment in 3 months.  Chilton Si, MD or one of the following Advanced Practice Providers on your designated Care Team:   Corine Shelter, PA-C Judy Pimple, [place] . Marjie Skiff, PA-C  You have been referred to [facility] REGARDING RESOURCES IN THE AREA

## 2018-09-22 NOTE — Progress Notes (Signed)
Cardiology Office Note   Date:  09/22/2018   ID:  JOS CYGAN, DOB August 23, 1971, MRN [id number]  PCP:  Alap[place] Bing, DO  Cardiologist:   Skeet Latch, MD  General Surgeon: Dr. [name]   No chief complaint on file.    History of Present Illness: Travis [name] is a 47 y.o. male with chronic systolic and diastolic heart failure, embolic stroke, diabetes, and hyperlipidemia, who presents for follow up.  He was initially seen 09/14/17 for pre-surgical risk assessment prior to cholesystectomy.  Travis [name] was evaluated by Dr. [name] for gallbladder stones and biliary dyskinesia.  At that appointment he reported exertional dyspnea.  It was noted that Travis [name] had a Lexiscan Myoview 07/29/16 that revealed LVEF 26% with infarct in the mid anteroseptal, inferoseptal and apical regions.  There was no reported ischemia.  Hypokinesis was noted in the apical and mild-distal anterior and inferior walls.  Dr. Redmond Pulling recommended laparoscopic cholecystectomy but he was referred for cardiac clearance. Since his stress test Travis [name] developed osteomyelitis of the L 5th toe and underwent general anesthesia 10/2016.  Travis [name] was diagnosed with diabetes in around 2008.  He did not have insurance and his diabetes was not under control for many years. In 2018 he started working hard and his hemoglobin A1c has been reduced from 11% to 5%.   Travis [name] reported feeling very fatigued and had exertional dyspnea.  He was started on aspirin, metoprolol, losartan, and rosuvastatin.  He was referred for a cardiac MRI 09/2017 that revealed LVEF 41% with a moderately dilated left ventricle with an apical aneurysm involving apex, apical inferior, septal, and anterior walls.  There was akinesis of the mid anteroseptal and inferoseptal walls. There was nearly transmural late gadolinium enhancement in the mid-apical anterior, septal and inferior walls with no chance of recovery with revascularization.  The  inferoseptum had good change of recovery with revascularization.  There was also mild MR.  He underwent LHC 10/19/17 which showed 65% OM1, 100% LAD, 50-80% RCA and 70% PDA stenoses.  Revascularization with CABG was recommended.  He underwent 3v CABG (LIMA-->LAD, SVG-->PDA, SVG-->LCX) on 11/29/17.  He followed up with Kerin Ransom 1/19 and was feeling weak.  He was noted to have L pleural effusion vs. LLL collapse and was started on lasix.  He was admitted with stroke 06/2018.  He had TPA and still has slurred speech but has otherwise recovered.  He had a biliary drain placed by IR 08/2018.   He has been feeling much better since that time.  He no longer feels sick all the time and is able to meat.  For the last couple of days Travis [name] has been feeling shaky and weak.  He struggles to exercise because he gets blisters on his feet when he tries to walk.  He has a Higher education careers adviser at [employer] and wants to start exercising.  He hasn't had any chest pain.  He notices that he gets short of breath easier than usual.  He has some mild LE edema worse at the end of the day.  He denies orthopnea or PND.  He struggles to eat well because his budget is very limited.  He is unable to afford cardiac rehab.    Past Medical History:  Diagnosis Date  . Acute on chronic systolic and diastolic heart failure, NYHA class 3 ([place]) 09/14/2017  . Acute osteomyelitis of metatarsal bone of left foot ([place]) 11/12/2015  . CAD in native  artery 09/14/2017  . Closed fracture of right tibial plateau 11/11/2015  . Depression with anxiety   . Diabetes mellitus    INSULIN DEPENDENT Type II  . Diabetic peripheral neuropathy associated with type 2 diabetes mellitus ([place]) 08/30/2008   Qualifier: Diagnosis of  By: [name] Done FNP, Tori Milks    . Diabetic ulcer of left foot ([place]) 11/14/2015  . Gastroparesis   . GERD (gastroesophageal reflux disease)   . Hypertension   . Left ventricular aneurysm   . Osteomyelitis ([place])   . Osteoporosis 11/12/2015  .  Peripheral neuropathy   . Shortness of breath dyspnea    with exertion  . Vitamin D deficiency 11/12/2015    Past Surgical History:  Procedure Laterality Date  . AMPUTATION Left 11/28/2015   Procedure: Left 4th Ray Amputation;  Surgeon: [name], MD;  Location: [place];  Service: Orthopedics;  Laterality: Left;  . AMPUTATION Left 10/29/2016   Procedure: LEFT 5TH RAY AMPUTATION;  Surgeon: [name], MD;  Location: [place];  Service: Orthopedics;  Laterality: Left;  . ANTERIOR VITRECTOMY Left 04/12/2016   Procedure: ANTERIOR CHAMBER [place] OUT WITH GAS FLUID EXCHANGE;  Surgeon: [name], MD;  Location: [place];  Service: Ophthalmology;  Laterality: Left;  . CARDIAC CATHETERIZATION    . CATARACT EXTRACTION Left   . CORONARY ARTERY BYPASS GRAFT N/A 11/29/2017    LIMA-LAD, SVG-PDA, SVG-CFX  Procedure: CORONARY ARTERY BYPASS GRAFTING (CABG) times three using the left saphaneous vien. harvested endoscopicly and left internal mammary artery.;  Surgeon: [name], MD;  Location: [place];  Service: Open Heart Surgery;  Laterality: N/A;  . EYE SURGERY     left eye surgery 04/2016  . IR PERC CHOLECYSTOSTOMY  09/05/2018  . KNEE SURGERY Left   . LOOP RECORDER INSERTION N/A 06/28/2018   Procedure: LOOP RECORDER INSERTION;  Surgeon: Deboraha Sprang, MD;  Location: [place] [facility];  Service: Cardiovascular;  Laterality: N/A;  . RIGHT/LEFT HEART CATH AND CORONARY ANGIOGRAPHY N/A 10/19/2017   Procedure: RIGHT/LEFT HEART CATH AND CORONARY ANGIOGRAPHY;  Surgeon: [name], [name], MD;  Location: [place] [facility];  Service: Cardiovascular;  Laterality: N/A;  . SHOULDER SURGERY Right   . TEE WITHOUT CARDIOVERSION N/A 11/29/2017   Procedure: TRANSESOPHAGEAL ECHOCARDIOGRAM (TEE);  Surgeon: [name], Collier Salina, MD;  Location: [place];  Service: Open Heart Surgery;  Laterality: N/A;  . TEE WITHOUT CARDIOVERSION N/A 06/28/2018   Procedure: TRANSESOPHAGEAL ECHOCARDIOGRAM (TEE);  Surgeon: [name], MD;   Location: [facility];  Service: Cardiovascular;  Laterality: N/A;  . TOE AMPUTATION Left 11/28/15   4th toe     Current Outpatient Medications  Medication Sig Dispense Refill  . aspirin EC 325 MG EC tablet Take 1 tablet (325 mg total) by mouth daily. 30 tablet 0  . Blood Glucose Monitoring Suppl (ONETOUCH VERIO) w/Device KIT 1 application by Does not apply route 4 (four) times daily. 1 kit 0  . clopidogrel (PLAVIX) 75 MG tablet Take 1 tablet (75 mg total) by mouth daily. 30 tablet 2  . Ferrous Sulfate (IRON) 325 (65 Fe) MG TABS Take 1 tablet (325 mg total) by mouth 2 (two) times daily after a meal. 30 each 0  . gabapentin (NEURONTIN) 800 MG tablet TAKE 1 TABLET(800 MG) BY MOUTH THREE TIMES DAILY (Patient taking differently: Take 2,400 mg by mouth at bedtime. ) 90 tablet 0  . glucose blood (ONETOUCH VERIO) test strip 1 each by Other route as needed for other. Use as  instructed 100 each 12  . insulin detemir (LEVEMIR) 100 UNIT/ML injection Inject 0.2 mLs (20 Units total) into the skin at bedtime. Take 1/2 of normal dose tonight 06/27/18 for planned procedure tomorrow. Then resume normal dose (Patient taking differently: Inject 20 Units into the skin at bedtime. Take 20 units at qhs) 30 mL 3  . losartan (COZAAR) 25 MG tablet Take 12.5 mg by mouth daily.   5  . metoCLOPramide (REGLAN) 10 MG tablet Take 1 tablet (10 mg total) by mouth 4 (four) times daily -  before meals and at bedtime. 90 tablet 1  . metoprolol succinate (TOPROL XL) 25 MG 24 hr tablet Take 0.5 tablets (12.5 mg total) by mouth daily. 30 tablet 5  . ONETOUCH DELICA LANCETS 28J MISC 1 application by Does not apply route 4 (four) times daily. 100 each 12  . ranitidine (ZANTAC) 150 MG tablet Take 1 tablet 2 (two) times daily by mouth.  12  . rosuvastatin (CRESTOR) 40 MG tablet Take 1 tablet (40 mg total) by mouth daily. 30 tablet 5   No current facility-administered medications for this visit.     Allergies:   Patient has no known  allergies.    Social History:  The patient  reports that he has never smoked. He has never used smokeless tobacco. He reports that he does not drink alcohol or use drugs.   Family History:  The patient's family history includes Anxiety disorder in his mother; COPD in his mother; Cancer in his paternal grandmother; Depression in his mother; Diabetes in his maternal grandfather, maternal grandmother, maternal uncle, and sister; Esophageal cancer in his father; Fibromyalgia in his mother and sister; GER disease in his sister; Heart failure in his mother; Hemachromatosis in his paternal grandfather; Liver disease in his paternal grandfather.    ROS:  Please see the history of present illness.   Otherwise, review of systems are positive for none.   All other systems are reviewed and negative.    PHYSICAL EXAM: VS:  BP 128/66   Pulse 67   Ht '6\' 2"'  (1.88 m)   Wt 225 lb (102.1 kg)   BMI 28.89 kg/m  , BMI Body mass index is 28.89 kg/m. GENERAL:  Chronically ill-appearing HEENT: Pupils equal round and reactive, fundi not visualized, oral mucosa unremarkable NECK:  No jugular venous distention, waveform within normal limits, carotid upstroke brisk and symmetric, no bruits LUNGS:  Clear to auscultation bilaterally HEART:  RRR.  PMI not displaced or sustained,S1 and S2 within normal limits, no S3, no S4, no clicks, no rubs, no murmurs ABD:  Flat, positive bowel sounds normal in frequency in pitch, no bruits, no rebound, no guarding, no midline pulsatile mass, no hepatomegaly, no splenomegaly. Biliary drain in place EXT:  2 plus pulses throughout, no edema, no cyanosis no clubbing.  Bilateral thenar and hypothenar wasting.  LE muscle wasting.   SKIN:  No rashes no nodules NEURO:  Cranial nerves II through XII grossly intact, motor grossly intact throughout PSYCH:  Cognitively intact, oriented to person place and time   EKG:  EKG is ordered today. The ekg ordered 09/14/17 demonstrates sinus rhythm.  Rate 77 bpm. Prior anterior infarct. Inferior ST elevations <71m.  cannot rule out prior inferior infarct. Unchanged from 05/21/16. 09/22/18: Sinus rhythm.  Rate 67 bpm.  Prior anterolateral infarct.  LLamoni8/17/17:  Defect 1: There is a medium defect of severe severity present in the mid anteroseptal, mid inferoseptal and apex location.  Findings consistent  with prior myocardial infarction. There is no ischemia identified.  This is an intermediate risk study based upon severely reduced ejection fraction consistent with prior infarction.  The left ventricular ejection fraction is severely decreased (<30%).  Nuclear stress EF: 26%.  Cardiac MRI 10/05/17: IMPRESSION: 1. Moderately dilated left ventricle with mild basal septal hypertrophy and moderately decreased systolic function (LVEF = 41%). There is apical aneurysm involving apical inferior, septal, anterior walls and true apex and akinesis of the mid anteroseptal and inferoseptal walls. There is no evidence for a thrombus.  There is almost transmural late gadolinium enhancement in the mid anteroseptal, apical anterior, septal, inferior walls and in the true septum with no chance of recovery if revascularized. Mid inferoseptal wall has good chance of recovery if revascularized.  2. Normal right ventricular size, thickness and systolic function (LVEF =33%). There are no regional wall motion abnormalities.  3. Mild mitral and trivial tricuspid regurgitation.  4. Trivial pericardial effusion with no signs of tamponade.  LHC/RHC 54/5/62:  LV end diastolic pressure is normal.  LV end diastolic pressure is normal.  Ost 1st Mrg to 1st Mrg lesion is 65% stenosed.  Ost LAD to Prox LAD lesion is 100% stenosed.  Prox RCA lesion is 50% stenosed.  Mid RCA lesion is 60% stenosed.  Dist RCA lesion is 80% stenosed.  Ost RPDA to RPDA lesion is 70% stenosed.   1. Severe 3 vessel obstructive CAD.    - 100% proximal  LAD with left to left and right to left collaterals.    - 60-70% very large OM1    - 60% mid RCA, Long 80% distal RCA extending down into the PDA 2. Normal LVEDP 3. Normal right heart pressures and cardiac output.  Plan: patient has complex, multivessel CAD with LV dysfunction. Should consider revascularization with CABG in a longstanding diabetic.    Recent Labs: 11/30/2017: Magnesium 2.1 03/03/2018: TSH 1.960 09/06/2018: ALT 15; BUN 11; Creatinine, Ser 0.95; Hemoglobin 13.4; Platelets 286; Potassium 4.0; Sodium 137    Lipid Panel    Component Value Date/Time   CHOL 141 06/26/2018 0213   CHOL 75 (L) 01/23/2018 0913   TRIG 211 (H) 06/26/2018 0213   HDL 26 (L) 06/26/2018 0213   HDL 32 (L) 01/23/2018 0913   CHOLHDL 5.4 06/26/2018 0213   VLDL 42 (H) 06/26/2018 0213   LDLCALC 73 06/26/2018 0213   LDLCALC 29 01/23/2018 0913      Wt Readings from Last 3 Encounters:  09/22/18 225 lb (102.1 kg)  09/13/18 225 lb 3.2 oz (102.2 kg)  09/05/18 220 lb 0.3 oz (99.8 kg)      ASSESSMENT AND PLAN:  # Chronic systolic and diastolic heart failure:  LVEF 35-40%.  Very mildly volume overloadd on exam.  Continue metoprolol, losartan and furosemide.  # CAD s/p CABG: # Hyperlipidemia:  Lexiscan Myoview showed evidence of a prior infarct.  He underwent CABG 11/2017.  Continue aspirin, clopidogrel, metoprolol and rosuvastatin.  # CVA: Loop recorder in place.  No arrhythmias noted thus far.  We will refer Travis [name] to our care guide.  He is is need of resources and struggling to exercise and eat well on his budget.    Current medicines are reviewed at length with the patient today.  The patient does not have concerns regarding medicines.  The following changes have been made:  None   Labs/ tests ordered today include:   No orders of the defined types were placed in this encounter.  Disposition:   FU with Demetrious Rainford C. Oval Linsey, MD, [facility] in 3 months     Signed, Jayleah Garbers C.  Oval Linsey, MD, [facility]  09/22/2018 9:44 AM    [facility]

## 2018-09-28 ENCOUNTER — Encounter: Payer: Self-pay | Admitting: Cardiovascular Disease

## 2018-09-28 ENCOUNTER — Other Ambulatory Visit: Payer: Self-pay

## 2018-10-05 ENCOUNTER — Telehealth: Payer: Self-pay

## 2018-10-05 ENCOUNTER — Ambulatory Visit (INDEPENDENT_AMBULATORY_CARE_PROVIDER_SITE_OTHER): Payer: Medicare Other | Admitting: *Deleted

## 2018-10-05 ENCOUNTER — Other Ambulatory Visit: Payer: Self-pay | Admitting: Family Medicine

## 2018-10-05 DIAGNOSIS — K219 Gastro-esophageal reflux disease without esophagitis: Secondary | ICD-10-CM

## 2018-10-05 DIAGNOSIS — I639 Cerebral infarction, unspecified: Secondary | ICD-10-CM

## 2018-10-05 LAB — BASIC METABOLIC PANEL
BUN/Creatinine Ratio: 12 (ref 9–20)
BUN: 12 mg/dL (ref 6–24)
CALCIUM: 9.9 mg/dL (ref 8.7–10.2)
CO2: 25 mmol/L (ref 20–29)
CREATININE: 0.98 mg/dL (ref 0.76–1.27)
Chloride: 98 mmol/L (ref 96–106)
GFR calc Af Amer: 106 mL/min/{1.73_m2} (ref >59)
GFR, EST NON AFRICAN AMERICAN: 91 mL/min/{1.73_m2} (ref >59)
Glucose: 222 mg/dL — ABNORMAL HIGH (ref 65–99)
Potassium: 4.8 mmol/L (ref 3.5–5.2)
SODIUM: 136 mmol/L (ref 134–144)

## 2018-10-05 MED ORDER — PANTOPRAZOLE SODIUM 40 MG PO TBEC: 40.0000 mg | DELAYED_RELEASE_TABLET | Freq: Every day | ORAL | 2 refills | Status: DC

## 2018-10-05 NOTE — Telephone Encounter (Signed)
Patient aunt, Drinda Butts, called. Patient needs an alternative to Ranitidine since it has been recalled. Also stated Gabapentin is not helping with patient's pain and sleep, he is not sleeping well at all. Can a different med be called in?  Call back is [phone] or [phone]  Ples Specter, RN [facility] RN)

## 2018-10-05 NOTE — Progress Notes (Signed)
[facility] Summary Report / Loop Recorder 

## 2018-10-05 NOTE — Telephone Encounter (Signed)
Called in prescription for Protonix. Has better efficacy than H2 blocks.   As for pain and insomnia, patient can increase his gabapentin by 1 additional tablet weekly (up to 3 additional tabs over next 3 weeks).   If his pain is worse at night, take additional tab in evening and if morning, take and additional tab in the morning, and if in the day, take additional tab in the day. Has appt with me on 10/23/18. Please advise.  Durward Parcel, DO [facility], PGY-3

## 2018-10-06 NOTE — Telephone Encounter (Signed)
Attempted to call patient's aunt Drinda Butts to informed her of the changes in patient's medication per Dr. Abelardo Diesel. There was no answer and no voice mail.  Will try at a later time.  Glennie Hawk, CMA

## 2018-10-06 NOTE — Telephone Encounter (Signed)
Called patient's aunt Drinda Butts and informed her of the RX for Protonix ta pharmacy.  Informed her of the instructions regarding Gabapentin.  Drinda Butts states that patient is not taking Gabapentin for pain. He was prescribed it for insomnia originally and had to tell the doctor that it was for pain in order to get it.  Patient is really having a hard time sleeping and wants something to help him sleep.  Aunt states that he had stopped taking it altogether because he said it was not helping him anymore.  She advised him to start again and maybe it would aid him in getting some sleep.  Drinda Butts wanted me to email all the information that I relayed to her to patient's sister. I told them to log in to My Chart.  Glennie Hawk, CMA

## 2018-10-11 ENCOUNTER — Telehealth: Payer: Self-pay

## 2018-10-11 ENCOUNTER — Other Ambulatory Visit: Payer: Self-pay

## 2018-10-11 NOTE — Telephone Encounter (Signed)
Called to follow up on resources patient was looking for. Left message

## 2018-10-11 NOTE — Patient Outreach (Signed)
[facility]) Care Management  10/11/2018  Travis [name] 04-13-1971 [id number]   Medication Adherence call to [name] left a message for patient to call back patient is due on Rosuvastatin 40 mg and Losartan 25 mg Travis [name] is showing past due under [employer].   Lillia Abed CPhT Pharmacy Technician [facility] Management Direct Dial [phone]  Fax [phone] [email or web address]

## 2018-10-17 ENCOUNTER — Encounter (HOSPITAL_COMMUNITY): Payer: Self-pay | Admitting: Interventional Radiology

## 2018-10-17 ENCOUNTER — Other Ambulatory Visit (HOSPITAL_COMMUNITY): Payer: Self-pay | Admitting: Radiology

## 2018-10-17 ENCOUNTER — Ambulatory Visit (HOSPITAL_COMMUNITY)
Admission: RE | Admit: 2018-10-17 | Discharge: 2018-10-17 | Disposition: A | Discharge status: Home / Self Care | Payer: Medicare Other | Source: Ambulatory Visit | Attending: Radiology | Admitting: Radiology

## 2018-10-17 DIAGNOSIS — Z9049 Acquired absence of other specified parts of digestive tract: Secondary | ICD-10-CM | POA: Insufficient documentation

## 2018-10-17 DIAGNOSIS — Z434 Encounter for attention to other artificial openings of digestive tract: Secondary | ICD-10-CM

## 2018-10-17 HISTORY — PX: IR CHOLANGIOGRAM EXISTING TUBE: IMG6040

## 2018-10-17 MED ORDER — LIDOCAINE HCL 1 % IJ SOLN
INTRAMUSCULAR | Status: AC
  Filled 2018-10-17: qty 20

## 2018-10-17 MED ORDER — IOHEXOL 300 MG/ML  SOLN
50.0000 mL | Freq: Once | INTRAMUSCULAR | Status: AC | PRN
  Administered 2018-10-17: 15 mL via INTRAVENOUS

## 2018-10-17 MED ORDER — IOPAMIDOL (ISOVUE-300) INJECTION 61%
INTRAVENOUS | Status: AC
  Filled 2018-10-17: qty 50

## 2018-10-20 LAB — CUP PACEART REMOTE DEVICE CHECK
Date Time Interrogation Session: 20191024160705
Implantable Pulse Generator Implant Date: 20190717

## 2018-10-23 ENCOUNTER — Ambulatory Visit (INDEPENDENT_AMBULATORY_CARE_PROVIDER_SITE_OTHER): Payer: Medicare Other | Admitting: Family Medicine

## 2018-10-23 VITALS — BP 117/80 | HR 78 | Temp 97.7°F | Ht 74.0 in | Wt 220.8 lb

## 2018-10-23 DIAGNOSIS — Z794 Long term (current) use of insulin: Secondary | ICD-10-CM | POA: Diagnosis not present

## 2018-10-23 DIAGNOSIS — E1143 Type 2 diabetes mellitus with diabetic autonomic (poly)neuropathy: Secondary | ICD-10-CM | POA: Diagnosis not present

## 2018-10-23 LAB — POCT GLYCOSYLATED HEMOGLOBIN (HGB A1C): HbA1c, POC (controlled diabetic range): 8 % — AB (ref 0.0–7.0)

## 2018-10-23 MED ORDER — METFORMIN HCL ER 750 MG PO TB24: ORAL_TABLET | ORAL | 3 refills | Status: DC

## 2018-10-23 NOTE — Progress Notes (Signed)
a1c

## 2018-10-23 NOTE — Patient Instructions (Addendum)
Thank you for coming in to see [place] today. Please see below to review our plan for today's visit.  1.  We will restart your metformin standard release at 750 mg daily (1 tablet).  Increase this to 2 tablets daily after 1 week.  We will recheck your glucose A1c in 3 months. 2.  I would talk with your surgeon concerning your cramping sensation near the incision site.  3.  Concerning your sleep, you can increase your gabapentin by 1 tablet every week as needed.  Do not exceed 3600 mg.  Please call the clinic at [phone] if your symptoms worsen or you have any concerns. It was our pleasure to serve you.  Durward Parcel, DO [facility], PGY-3

## 2018-10-23 NOTE — Progress Notes (Signed)
   Subjective   Patient ID: Travis [name]    DOB: [birth year], 47 y.o. male   MRN: [id number]  CC: "Diabetes"  HPI: Travis [name] is a 47 y.o. male who presents to clinic today for the following:  Diabetes: Patient on insulin-dependent diabetic who has been borderline controlled here today for his 103-month checkup.  His last A1c was 6.94 months ago.  He reports good adherence to his Levemir 20 units daily.  His CBGs continue to be in the low to mid 200s.  Patient is currently receiving treatment for gallbladder disease with cholecystotomy with hopes for surgical intervention in March.  ROS: see HPI for pertinent.  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 Family history COPD, heart failure, esophogeal cancer (fatehr), fibromyalgia, DM. Smoking status reviewed. Medications reviewed.  Objective   BP 117/80 (BP Location: Left Arm, Patient Position: Sitting, Cuff Size: Normal)   Pulse 78   Temp 97.7 F (36.5 C) (Oral)   Ht 6\' 2"  (1.88 m)   Wt 220 lb 12.8 oz (100.2 kg)   SpO2 98%   BMI 28.35 kg/m  Vitals and nursing note reviewed.  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  Suboptimal control.  This is likely in the context of multiple medical complications over the last several months, more recently gallbladder disease.  It may be reasonable to keep A1c below 8, however patient shows history of maintaining A1c below 7.  He reports taking metformin in the past but was unsure why it was discontinued.  He has no known drug allergies. - Restarting metformin extended release 750 mg daily with plan to increase to 1500 mg daily - Continue Levemir 20 units daily - RTC 3 months for next A1c  Orders Placed This Encounter  Procedures  . HgB A1c   Meds ordered this encounter  Medications  . metFORMIN (GLUCOPHAGE XR) 750 MG 24 hr tablet    Sig: TAKE 1 TAB DAILY FOR 1 WEEK THEN INCREASE TO 2 TABS DAILY    Dispense:  180 tablet    Refill:  3    Durward Parcel, DO [facility], PGY-3 10/25/2018, 8:03 AM

## 2018-10-25 ENCOUNTER — Encounter: Payer: Self-pay | Admitting: Family Medicine

## 2018-10-25 NOTE — Assessment & Plan Note (Addendum)
Chronic.  Suboptimal control.  This is likely in the context of multiple medical complications over the last several months, more recently gallbladder disease.  It may be reasonable to keep A1c below 8, however patient shows history of maintaining A1c below 7.  He reports taking metformin in the past but was unsure why it was discontinued.  He has no known drug allergies. - Restarting metformin extended release 750 mg daily with plan to increase to 1500 mg daily - Continue Levemir 20 units daily - RTC 3 months for next A1c

## 2018-10-29 ENCOUNTER — Telehealth: Payer: Self-pay | Admitting: Family Medicine

## 2018-10-29 ENCOUNTER — Inpatient Hospital Stay (HOSPITAL_COMMUNITY)
Admission: EM | Admit: 2018-10-29 | Discharge: 2018-11-02 | DRG: 872 | Disposition: A | Discharge status: Home / Self Care | Payer: Medicare Other | Attending: Family Medicine | Admitting: Family Medicine

## 2018-10-29 ENCOUNTER — Other Ambulatory Visit: Payer: Self-pay

## 2018-10-29 ENCOUNTER — Emergency Department (HOSPITAL_COMMUNITY): Payer: Medicare Other

## 2018-10-29 ENCOUNTER — Encounter (HOSPITAL_COMMUNITY): Payer: Self-pay | Admitting: *Deleted

## 2018-10-29 DIAGNOSIS — Z8249 Family history of ischemic heart disease and other diseases of the circulatory system: Secondary | ICD-10-CM

## 2018-10-29 DIAGNOSIS — Z7982 Long term (current) use of aspirin: Secondary | ICD-10-CM

## 2018-10-29 DIAGNOSIS — E1142 Type 2 diabetes mellitus with diabetic polyneuropathy: Secondary | ICD-10-CM | POA: Diagnosis present

## 2018-10-29 DIAGNOSIS — K219 Gastro-esophageal reflux disease without esophagitis: Secondary | ICD-10-CM | POA: Diagnosis present

## 2018-10-29 DIAGNOSIS — I255 Ischemic cardiomyopathy: Secondary | ICD-10-CM | POA: Diagnosis present

## 2018-10-29 DIAGNOSIS — K810 Acute cholecystitis: Secondary | ICD-10-CM | POA: Diagnosis present

## 2018-10-29 DIAGNOSIS — E559 Vitamin D deficiency, unspecified: Secondary | ICD-10-CM | POA: Diagnosis present

## 2018-10-29 DIAGNOSIS — R112 Nausea with vomiting, unspecified: Secondary | ICD-10-CM | POA: Diagnosis not present

## 2018-10-29 DIAGNOSIS — Z794 Long term (current) use of insulin: Secondary | ICD-10-CM

## 2018-10-29 DIAGNOSIS — M810 Age-related osteoporosis without current pathological fracture: Secondary | ICD-10-CM | POA: Diagnosis present

## 2018-10-29 DIAGNOSIS — N179 Acute kidney failure, unspecified: Secondary | ICD-10-CM | POA: Diagnosis present

## 2018-10-29 DIAGNOSIS — E1143 Type 2 diabetes mellitus with diabetic autonomic (poly)neuropathy: Secondary | ICD-10-CM | POA: Diagnosis present

## 2018-10-29 DIAGNOSIS — I639 Cerebral infarction, unspecified: Secondary | ICD-10-CM | POA: Diagnosis present

## 2018-10-29 DIAGNOSIS — Z825 Family history of asthma and other chronic lower respiratory diseases: Secondary | ICD-10-CM | POA: Diagnosis not present

## 2018-10-29 DIAGNOSIS — Z7902 Long term (current) use of antithrombotics/antiplatelets: Secondary | ICD-10-CM

## 2018-10-29 DIAGNOSIS — E785 Hyperlipidemia, unspecified: Secondary | ICD-10-CM | POA: Diagnosis present

## 2018-10-29 DIAGNOSIS — F329 Major depressive disorder, single episode, unspecified: Secondary | ICD-10-CM | POA: Diagnosis present

## 2018-10-29 DIAGNOSIS — A419 Sepsis, unspecified organism: Secondary | ICD-10-CM | POA: Diagnosis present

## 2018-10-29 DIAGNOSIS — D649 Anemia, unspecified: Secondary | ICD-10-CM | POA: Diagnosis present

## 2018-10-29 DIAGNOSIS — Z951 Presence of aortocoronary bypass graft: Secondary | ICD-10-CM

## 2018-10-29 DIAGNOSIS — I110 Hypertensive heart disease with heart failure: Secondary | ICD-10-CM | POA: Diagnosis present

## 2018-10-29 DIAGNOSIS — Z434 Encounter for attention to other artificial openings of digestive tract: Secondary | ICD-10-CM | POA: Diagnosis not present

## 2018-10-29 DIAGNOSIS — R9389 Abnormal findings on diagnostic imaging of other specified body structures: Secondary | ICD-10-CM | POA: Diagnosis not present

## 2018-10-29 DIAGNOSIS — Z8673 Personal history of transient ischemic attack (TIA), and cerebral infarction without residual deficits: Secondary | ICD-10-CM

## 2018-10-29 DIAGNOSIS — D72829 Elevated white blood cell count, unspecified: Secondary | ICD-10-CM | POA: Diagnosis present

## 2018-10-29 DIAGNOSIS — Z89512 Acquired absence of left leg below knee: Secondary | ICD-10-CM | POA: Diagnosis not present

## 2018-10-29 DIAGNOSIS — E872 Acidosis: Secondary | ICD-10-CM | POA: Diagnosis present

## 2018-10-29 DIAGNOSIS — Z818 Family history of other mental and behavioral disorders: Secondary | ICD-10-CM

## 2018-10-29 DIAGNOSIS — Z833 Family history of diabetes mellitus: Secondary | ICD-10-CM

## 2018-10-29 DIAGNOSIS — E119 Type 2 diabetes mellitus without complications: Secondary | ICD-10-CM

## 2018-10-29 DIAGNOSIS — F330 Major depressive disorder, recurrent, mild: Secondary | ICD-10-CM | POA: Diagnosis present

## 2018-10-29 DIAGNOSIS — Z9842 Cataract extraction status, left eye: Secondary | ICD-10-CM

## 2018-10-29 DIAGNOSIS — K3184 Gastroparesis: Secondary | ICD-10-CM | POA: Diagnosis present

## 2018-10-29 DIAGNOSIS — I5042 Chronic combined systolic (congestive) and diastolic (congestive) heart failure: Secondary | ICD-10-CM | POA: Diagnosis present

## 2018-10-29 DIAGNOSIS — E11319 Type 2 diabetes mellitus with unspecified diabetic retinopathy without macular edema: Secondary | ICD-10-CM | POA: Diagnosis present

## 2018-10-29 DIAGNOSIS — I2510 Atherosclerotic heart disease of native coronary artery without angina pectoris: Secondary | ICD-10-CM | POA: Diagnosis present

## 2018-10-29 DIAGNOSIS — Z800 Family history of malignant neoplasm of digestive organs: Secondary | ICD-10-CM

## 2018-10-29 DIAGNOSIS — R1110 Vomiting, unspecified: Secondary | ICD-10-CM

## 2018-10-29 DIAGNOSIS — E876 Hypokalemia: Secondary | ICD-10-CM | POA: Diagnosis not present

## 2018-10-29 HISTORY — DX: Sepsis, unspecified organism: A41.9

## 2018-10-29 LAB — I-STAT CG4 LACTIC ACID, ED
Lactic Acid, Venous: 4.58 mmol/L (ref 0.5–1.9)
Lactic Acid, Venous: 3.24 mmol/L (ref 0.5–1.9)

## 2018-10-29 LAB — CBC WITH DIFFERENTIAL/PLATELET
Abs Immature Granulocytes: 0.05 10*3/uL (ref 0.00–0.07)
BASOS PCT: 0 %
Basophils Absolute: 0 10*3/uL (ref 0.0–0.1)
EOS ABS: 0 10*3/uL (ref 0.0–0.5)
Eosinophils Relative: 0 %
HCT: 42.8 % (ref 39.0–52.0)
Hemoglobin: 14 g/dL (ref 13.0–17.0)
IMMATURE GRANULOCYTES: 0 %
Lymphocytes Relative: 12 %
Lymphs Abs: 1.7 10*3/uL (ref 0.7–4.0)
MCH: 28.1 pg (ref 26.0–34.0)
MCHC: 32.7 g/dL (ref 30.0–36.0)
MCV: 85.9 fL (ref 80.0–100.0)
Monocytes Absolute: 0.7 10*3/uL (ref 0.1–1.0)
Monocytes Relative: 5 %
NEUTROS PCT: 83 %
NRBC: 0 % (ref 0.0–0.2)
Neutro Abs: 11.9 10*3/uL — ABNORMAL HIGH (ref 1.7–7.7)
Platelets: 337 10*3/uL (ref 150–400)
RBC: 4.98 MIL/uL (ref 4.22–5.81)
RDW: 12.6 % (ref 11.5–15.5)
WBC: 14.4 10*3/uL — ABNORMAL HIGH (ref 4.0–10.5)

## 2018-10-29 LAB — URINALYSIS, ROUTINE W REFLEX MICROSCOPIC
BACTERIA UA: NONE SEEN
BILIRUBIN URINE: NEGATIVE
Glucose, UA: >=500 mg/dL — AB
Ketones, ur: 80 mg/dL — AB
LEUKOCYTES UA: NEGATIVE
NITRITE: NEGATIVE
PROTEIN: 100 mg/dL — AB
pH: 5 (ref 5.0–8.0)

## 2018-10-29 LAB — COMPREHENSIVE METABOLIC PANEL
ALBUMIN: 4.3 g/dL (ref 3.5–5.0)
ALK PHOS: 83 U/L (ref 38–126)
ALT: 28 U/L (ref 0–44)
AST: 42 U/L — ABNORMAL HIGH (ref 15–41)
Anion gap: 17 — ABNORMAL HIGH (ref 5–15)
BUN: 19 mg/dL (ref 6–20)
CALCIUM: 9.6 mg/dL (ref 8.9–10.3)
CHLORIDE: 99 mmol/L (ref 98–111)
CO2: 16 mmol/L — AB (ref 22–32)
CREATININE: 1.2 mg/dL (ref 0.61–1.24)
GFR calc Af Amer: >60 mL/min (ref >60)
GFR calc non Af Amer: >60 mL/min (ref >60)
Glucose, Bld: 251 mg/dL — ABNORMAL HIGH (ref 70–99)
Potassium: 5 mmol/L (ref 3.5–5.1)
SODIUM: 132 mmol/L — AB (ref 135–145)
Total Bilirubin: 5.7 mg/dL — ABNORMAL HIGH (ref 0.3–1.2)
Total Protein: 7.9 g/dL (ref 6.5–8.1)

## 2018-10-29 LAB — POC OCCULT BLOOD, ED: Fecal Occult Bld: NEGATIVE

## 2018-10-29 LAB — GLUCOSE, CAPILLARY: Glucose-Capillary: 225 mg/dL — ABNORMAL HIGH (ref 70–99)

## 2018-10-29 LAB — PROTIME-INR
INR: 1.2
PROTHROMBIN TIME: 15.1 s (ref 11.4–15.2)

## 2018-10-29 LAB — LACTIC ACID, PLASMA
Lactic Acid, Venous: 1.7 mmol/L (ref 0.5–1.9)
Lactic Acid, Venous: 1.9 mmol/L (ref 0.5–1.9)

## 2018-10-29 LAB — LIPASE, BLOOD: LIPASE: 25 U/L (ref 11–51)

## 2018-10-29 LAB — CBG MONITORING, ED: GLUCOSE-CAPILLARY: 243 mg/dL — AB (ref 70–99)

## 2018-10-29 MED ORDER — PIPERACILLIN-TAZOBACTAM 3.375 G IVPB 30 MIN
3.3750 g | Freq: Once | INTRAVENOUS | Status: AC
  Administered 2018-10-29: 3.375 g via INTRAVENOUS
  Filled 2018-10-29: qty 50

## 2018-10-29 MED ORDER — MORPHINE SULFATE (PF) 2 MG/ML IV SOLN
2.0000 mg | INTRAVENOUS | Status: DC | PRN
  Administered 2018-10-29 – 2018-11-02 (×10): 2 mg via INTRAVENOUS
  Filled 2018-10-29 (×10): qty 1

## 2018-10-29 MED ORDER — SODIUM CHLORIDE 0.9 % IV BOLUS
1000.0000 mL | Freq: Once | INTRAVENOUS | Status: AC
  Administered 2018-10-29: 1000 mL via INTRAVENOUS

## 2018-10-29 MED ORDER — INSULIN ASPART 100 UNIT/ML ~~LOC~~ SOLN
0.0000 [IU] | Freq: Three times a day (TID) | SUBCUTANEOUS | Status: DC
  Administered 2018-10-30 (×2): 2 [IU] via SUBCUTANEOUS
  Administered 2018-10-30: 1 [IU] via SUBCUTANEOUS

## 2018-10-29 MED ORDER — ONDANSETRON HCL 4 MG/2ML IJ SOLN
4.0000 mg | Freq: Once | INTRAMUSCULAR | Status: AC
  Administered 2018-10-29: 4 mg via INTRAVENOUS
  Filled 2018-10-29: qty 2

## 2018-10-29 MED ORDER — IOHEXOL 300 MG/ML  SOLN
100.0000 mL | Freq: Once | INTRAMUSCULAR | Status: AC | PRN
  Administered 2018-10-29: 100 mL via INTRAVENOUS

## 2018-10-29 MED ORDER — METOPROLOL TARTRATE 25 MG PO TABS
12.5000 mg | ORAL_TABLET | Freq: Two times a day (BID) | ORAL | Status: DC
  Administered 2018-10-29 – 2018-11-01 (×7): 12.5 mg via ORAL
  Filled 2018-10-29 (×7): qty 1

## 2018-10-29 MED ORDER — MORPHINE SULFATE (PF) 4 MG/ML IV SOLN
4.0000 mg | Freq: Once | INTRAVENOUS | Status: AC
  Administered 2018-10-29: 4 mg via INTRAVENOUS
  Filled 2018-10-29: qty 1

## 2018-10-29 MED ORDER — PIPERACILLIN-TAZOBACTAM 3.375 G IVPB
3.3750 g | Freq: Three times a day (TID) | INTRAVENOUS | Status: DC
  Administered 2018-10-29 – 2018-11-01 (×7): 3.375 g via INTRAVENOUS
  Filled 2018-10-29 (×9): qty 50

## 2018-10-29 MED ORDER — GABAPENTIN 400 MG PO CAPS
800.0000 mg | ORAL_CAPSULE | Freq: Every day | ORAL | Status: DC
  Administered 2018-10-29 – 2018-10-31 (×3): 800 mg via ORAL
  Filled 2018-10-29 (×4): qty 2

## 2018-10-29 MED ORDER — SODIUM CHLORIDE 0.9 % IV SOLN
INTRAVENOUS | Status: AC
  Administered 2018-10-29 – 2018-10-30 (×2): via INTRAVENOUS

## 2018-10-29 MED ORDER — LORAZEPAM 2 MG/ML IJ SOLN
1.0000 mg | Freq: Once | INTRAMUSCULAR | Status: AC
  Administered 2018-10-29: 1 mg via INTRAVENOUS
  Filled 2018-10-29: qty 1

## 2018-10-29 MED ORDER — GABAPENTIN 800 MG PO TABS
800.0000 mg | ORAL_TABLET | Freq: Every day | ORAL | Status: DC
  Filled 2018-10-29: qty 1

## 2018-10-29 MED ORDER — LORAZEPAM 2 MG/ML IJ SOLN
0.5000 mg | Freq: Once | INTRAMUSCULAR | Status: AC
  Administered 2018-10-29: 0.5 mg via INTRAVENOUS
  Filled 2018-10-29: qty 1

## 2018-10-29 MED ORDER — INSULIN GLARGINE 100 UNIT/ML ~~LOC~~ SOLN
10.0000 [IU] | Freq: Every day | SUBCUTANEOUS | Status: DC
  Administered 2018-10-30 – 2018-11-01 (×3): 10 [IU] via SUBCUTANEOUS
  Filled 2018-10-29 (×4): qty 0.1

## 2018-10-29 MED ORDER — HEPARIN SODIUM (PORCINE) 5000 UNIT/ML IJ SOLN
5000.0000 [IU] | Freq: Three times a day (TID) | INTRAMUSCULAR | Status: DC
  Administered 2018-10-29 – 2018-11-02 (×11): 5000 [IU] via SUBCUTANEOUS
  Filled 2018-10-29 (×11): qty 1

## 2018-10-29 NOTE — Telephone Encounter (Signed)
**  After Hours/ Emergency Line Call*  Received a call to report that Katha CabalMichael T Kortz from caregiver Glade NurseLaurie Long that he is being transported 2/2 vomiting to [facility] via EMS for continued vomiting starting Friday and cold chills. She says Dr. Abelardo DieselMcMullen told her to call the after hours line if this recurred. Hx of gastroparesis and cholecystostomy. Patient has had so many episodes of gastroparesis that he is reluctant to allow caregiver to call 911 even when he feels quite badly. Vitals seem ok so far per her report. No documented fevers although caregiver reports he has soaked through his clothes. Ms. Jacqulyn BathLong is his sister. She expressed some frustration when I explained that he will go through the emergency room (states they have had to wait for some time before), and I expressed empathy, stating that we wish no one had to wait. We will gladly admit if ED MD deems appropriate. Will forward to PCP.  Loni MuseKate Talbot Monarch, MD PGY-3, [facility]

## 2018-10-29 NOTE — ED Notes (Signed)
[place] Acid results of 3.24 reported to Kipp BroodSarah Baily RN

## 2018-10-29 NOTE — ED Notes (Signed)
Attempted report x1. 

## 2018-10-29 NOTE — Progress Notes (Signed)
Pharmacy Antibiotic Note  Travis [name] is a 47 y.o. male admitted on 10/29/2018 with intraabdominal infection.  Pharmacy has been consulted for Zosyn dosing.  CC/HPI: N/V, chills, recent biliary duct drain  ID: Start abs for intraabdominal infection. LA 4.58. WBC 14.4. Zosyn 11/17>>  Plan: Zosyn 3.375g IV q 8hrs Pharmacy will sign off. Please reconsult for further dosing assitance.   Height: 6\' 2"  (188 cm) Weight: 220 lb (99.8 kg) IBW/kg (Calculated) : 82.2  Temp (24hrs), Avg:98.4 F (36.9 C), Min:97.3 F (36.3 C), Max:99 F (37.2 C)  Recent Labs  Lab 10/29/18 1353 10/29/18 1400  WBC 14.4*  --   CREATININE 1.20  --   LATICACIDVEN  --  4.58*    Estimated Creatinine Clearance: 96 mL/min (by C-G formula based on SCr of 1.2 mg/dL).    No Known Allergies   Zanylah Hardie S. Merilynn Finlandobertson, PharmD, BCPS Clinical Staff Pharmacist Misty StanleyRobertson, Marilouise Densmore Stillinger 10/29/2018 2:49 PM

## 2018-10-29 NOTE — ED Notes (Signed)
I [place] result of 4.58 reported to Arsenio LoaderJoyce Brunson-Ollison RN

## 2018-10-29 NOTE — Consult Note (Signed)
Reason for Consult: jaundice Referring Physician: Lido Maske is an 47 y.o. male.  HPI: 47 yo male with recent history of acute cholecystitis in 08/2018 who underwent cholecystostomy tube due to CABG and CVA in the past year on plavix. He began having worsening abdominal pain yesterday. He has nausea and vomiting throughout today. He has been constipated and denies diarrhea. He feels very similar to his symptoms for gallbladder disease in the past.  Past Medical History:  Diagnosis Date  . Acute on chronic systolic and diastolic heart failure, NYHA class 3 ([place]) 09/14/2017  . Acute osteomyelitis of metatarsal bone of left foot ([place]) 11/12/2015  . CAD in native artery 09/14/2017  . Closed fracture of right tibial plateau 11/11/2015  . Depression with anxiety   . Diabetes mellitus    INSULIN DEPENDENT Type II  . Diabetic peripheral neuropathy associated with type 2 diabetes mellitus ([place]) 08/30/2008   Qualifier: Diagnosis of  By: [name] Done FNP, Tori Milks    . Diabetic ulcer of left foot ([place]) 11/14/2015  . Gastroparesis   . GERD (gastroesophageal reflux disease)   . Hypertension   . Left ventricular aneurysm   . Osteomyelitis ([place])   . Osteoporosis 11/12/2015  . Peripheral neuropathy   . Shortness of breath dyspnea    with exertion  . Vitamin D deficiency 11/12/2015    Past Surgical History:  Procedure Laterality Date  . AMPUTATION Left 11/28/2015   Procedure: Left 4th Ray Amputation;  Surgeon: [name], MD;  Location: [facility];  Service: Orthopedics;  Laterality: Left;  . AMPUTATION Left 10/29/2016   Procedure: LEFT 5TH RAY AMPUTATION;  Surgeon: [name], MD;  Location: [facility];  Service: Orthopedics;  Laterality: Left;  . ANTERIOR VITRECTOMY Left 04/12/2016   Procedure: ANTERIOR CHAMBER Pax OUT WITH GAS FLUID EXCHANGE;  Surgeon: [name], MD;  Location: [facility];  Service: Ophthalmology;  Laterality: Left;  . CARDIAC CATHETERIZATION    . CATARACT EXTRACTION  Left   . CORONARY ARTERY BYPASS GRAFT N/A 11/29/2017    LIMA-LAD, SVG-PDA, SVG-CFX  Procedure: CORONARY ARTERY BYPASS GRAFTING (CABG) times three using the left saphaneous vien. harvested endoscopicly and left internal mammary artery.;  Surgeon: [name], MD;  Location: [facility];  Service: Open Heart Surgery;  Laterality: N/A;  . EYE SURGERY     left eye surgery 04/2016  . IR CHOLANGIOGRAM EXISTING TUBE  10/17/2018  . IR PERC CHOLECYSTOSTOMY  09/05/2018  . KNEE SURGERY Left   . LOOP RECORDER INSERTION N/A 06/28/2018   Procedure: LOOP RECORDER INSERTION;  Surgeon: Deboraha Sprang, MD;  Location: [facility];  Service: Cardiovascular;  Laterality: N/A;  . RIGHT/LEFT HEART CATH AND CORONARY ANGIOGRAPHY N/A 10/19/2017   Procedure: RIGHT/LEFT HEART CATH AND CORONARY ANGIOGRAPHY;  Surgeon: [name], [name], MD;  Location: [facility];  Service: Cardiovascular;  Laterality: N/A;  . SHOULDER SURGERY Right   . TEE WITHOUT CARDIOVERSION N/A 11/29/2017   Procedure: TRANSESOPHAGEAL ECHOCARDIOGRAM (TEE);  Surgeon: [name], Collier Salina, MD;  Location: [facility];  Service: Open Heart Surgery;  Laterality: N/A;  . TEE WITHOUT CARDIOVERSION N/A 06/28/2018   Procedure: TRANSESOPHAGEAL ECHOCARDIOGRAM (TEE);  Surgeon: [name], MD;  Location: [facility];  Service: Cardiovascular;  Laterality: N/A;  . TOE AMPUTATION Left 11/28/15   4th toe    Family History  Problem Relation Age of Onset  . Cancer Paternal Grandmother   . COPD Mother   . Heart failure Mother   .  Anxiety disorder Mother   . Depression Mother   . Fibromyalgia Mother   . Esophageal cancer Father        Smoker, still does  . Diabetes Sister   . Fibromyalgia Sister   . Diabetes Maternal Uncle   . Diabetes Maternal Grandmother   . GER disease Sister   . Liver disease Paternal Grandfather   . Hemachromatosis Paternal Grandfather   . Diabetes Maternal Grandfather     Social History:  reports that he has never smoked. He has  never used smokeless tobacco. He reports that he does not drink alcohol or use drugs.  Allergies: No Known Allergies  Medications: I have reviewed the patient's current medications.  Results for orders placed or performed during the hospital encounter of 10/29/18 (from the past 48 hour(s))  CBG monitoring, ED     Status: Abnormal   Collection Time: 10/29/18  1:48 PM  Result Value Ref Range   Glucose-Capillary 243 (H) 70 - 99 mg/dL  CBC with Differential     Status: Abnormal   Collection Time: 10/29/18  1:53 PM  Result Value Ref Range   WBC 14.4 (H) 4.0 - 10.5 K/uL   RBC 4.98 4.22 - 5.81 MIL/uL   Hemoglobin 14.0 13.0 - 17.0 g/dL   HCT 42.8 39.0 - 52.0 %   MCV 85.9 80.0 - 100.0 fL   MCH 28.1 26.0 - 34.0 pg   MCHC 32.7 30.0 - 36.0 g/dL   RDW 12.6 11.5 - 15.5 %   Platelets 337 150 - 400 K/uL   nRBC 0.0 0.0 - 0.2 %   Neutrophils Relative % 83 %   Neutro Abs 11.9 (H) 1.7 - 7.7 K/uL   Lymphocytes Relative 12 %   Lymphs Abs 1.7 0.7 - 4.0 K/uL   Monocytes Relative 5 %   Monocytes Absolute 0.7 0.1 - 1.0 K/uL   Eosinophils Relative 0 %   Eosinophils Absolute 0.0 0.0 - 0.5 K/uL   Basophils Relative 0 %   Basophils Absolute 0.0 0.0 - 0.1 K/uL   Immature Granulocytes 0 %   Abs Immature Granulocytes 0.05 0.00 - 0.07 K/uL    Comment: Performed at [facility], [address], [place], [place] [address]  Comprehensive metabolic panel     Status: Abnormal   Collection Time: 10/29/18  1:53 PM  Result Value Ref Range   Sodium 132 (L) 135 - 145 mmol/L   Potassium 5.0 3.5 - 5.1 mmol/L   Chloride 99 98 - 111 mmol/L   CO2 16 (L) 22 - 32 mmol/L   Glucose, Bld 251 (H) 70 - 99 mg/dL   BUN 19 6 - 20 mg/dL   Creatinine, Ser 1.20 0.61 - 1.24 mg/dL   Calcium 9.6 8.9 - 10.3 mg/dL   Total Protein 7.9 6.5 - 8.1 g/dL   Albumin 4.3 3.5 - 5.0 g/dL   AST 42 (H) 15 - 41 U/L   ALT 28 0 - 44 U/L   Alkaline Phosphatase 83 38 - 126 U/L   Total Bilirubin 5.7 (H) 0.3 - 1.2 mg/dL   GFR calc non Af Amer  >60 >60 mL/min   GFR calc Af Amer >60 >60 mL/min    Comment: (NOTE) The eGFR has been calculated using the CKD EPI equation. This calculation has not been validated in all clinical situations. eGFR's persistently <60 mL/min signify possible Chronic Kidney Disease.    Anion gap 17 (H) 5 - 15    Comment: Performed at [facility]  [facility], [place] [address] [place], [place] [address]  Lipase, blood     Status: None   Collection Time: 10/29/18  1:53 PM  Result Value Ref Range   Lipase 25 11 - 51 U/L    Comment: Performed at [facility] [address] [place], [place] [address]  I-Stat CG4 Lactic Acid, ED     Status: Abnormal   Collection Time: 10/29/18  2:00 PM  Result Value Ref Range   Lactic Acid, Venous 4.58 (HH) 0.5 - 1.9 mmol/L   Comment NOTIFIED PHYSICIAN   POC occult blood, ED Provider will collect     Status: None   Collection Time: 10/29/18  4:11 PM  Result Value Ref Range   Fecal Occult Bld NEGATIVE NEGATIVE  I-Stat CG4 Lactic Acid, ED     Status: Abnormal   Collection Time: 10/29/18  4:30 PM  Result Value Ref Range   Lactic Acid, Venous 3.24 (HH) 0.5 - 1.9 mmol/L   Comment NOTIFIED PHYSICIAN     Ct Abdomen Pelvis W Contrast  Result Date: 10/29/2018 CLINICAL DATA:  Nausea and vomiting for several days. EXAM: CT ABDOMEN AND PELVIS WITH CONTRAST TECHNIQUE: Multidetector CT imaging of the abdomen and pelvis was performed using the standard protocol following bolus administration of intravenous contrast. CONTRAST:  146m OMNIPAQUE IOHEXOL 300 MG/ML  SOLN COMPARISON:  Right upper quadrant abdomen ultrasound dated 08/11/2018. Abdomen and pelvis CT dated 04/10/2017. FINDINGS: Lower chest: Right lower lobe calcified granuloma. Hepatobiliary: Cholecystostomy tube with a collapsed gallbladder. Unremarkable liver. Pancreas: Unremarkable. No pancreatic ductal dilatation or surrounding inflammatory changes. Spleen: Normal in size without focal abnormality. Adrenals/Urinary Tract:  Adrenal glands are unremarkable. Interval small, heterogeneous area of decreased density in the lower pole of the left kidney, measuring 1.5 x 1.1 cm on coronal image number 63 series 6. This has an appearance most compatible with interval cortical scar in the axial plane on images 28 series of 8 and 50 of series 3. Otherwise, the kidneys are normal, without renal calculi, focal lesion, or hydronephrosis. Bladder is unremarkable. Stomach/Bowel: Stomach is within normal limits. Appendix appears normal. No evidence of bowel wall thickening, distention, or inflammatory changes. Vascular/Lymphatic: No significant vascular findings are present. No enlarged abdominal or pelvic lymph nodes. Reproductive: Prostate is unremarkable. Other: No abdominal wall hernia or abnormality. No abdominopelvic ascites. Musculoskeletal: Interval approximately 20% superior endplate compression deformity of the L3 vertebral body with Schmorl's node formation and sclerosis. No acute fracture lines or bony retropulsion or seen. Minimal lower thoracic and lower lumbar spine degenerative changes. IMPRESSION: 1. Interval probable small cortical scar in the lower pole of the left kidney. A small focus of pyelonephritis is less likely. This also does not have the typical appearance of a mass, especially in the axial plane, but this is not totally excluded. Therefore, a follow up abdomen CT or MRI without and with contrast is recommended in 6 months. 2. Cholecystostomy tube with a collapsed gallbladder. Electronically Signed   By: SClaudie ReveringM.D.   On: 10/29/2018 16:29   Dg Chest Portable 1 View  Result Date: 10/29/2018 CLINICAL DATA:  Shortness of breath. EXAM: PORTABLE CHEST 1 VIEW COMPARISON:  Chest x-ray dated January 17, 2018. FINDINGS: Heart remains at the upper limits of normal in size status post CABG. Interval placement of a loop recorder. Normal pulmonary vascularity. No focal consolidation, pleural effusion, or pneumothorax. No  acute osseous abnormality. IMPRESSION: No active disease. Electronically Signed   By: WTitus Dubin  M.D.   On: 10/29/2018 16:28    Review of Systems  Constitutional: Negative for chills and fever.  HENT: Negative for hearing loss.   Eyes: Negative for blurred vision and double vision.  Respiratory: Negative for cough and hemoptysis.   Cardiovascular: Negative for chest pain and palpitations.  Gastrointestinal: Positive for abdominal pain, constipation, nausea and vomiting.  Genitourinary: Negative for dysuria and urgency.  Musculoskeletal: Negative for myalgias and neck pain.  Skin: Negative for itching and rash.  Neurological: Negative for dizziness, tingling and headaches.  Endo/Heme/Allergies: Does not bruise/bleed easily.  Psychiatric/Behavioral: Negative for depression and suicidal ideas.   Blood pressure (!) 148/89, pulse 86, temperature 99.6 F (37.6 C), temperature source Rectal, resp. rate (!) 25, height _0  (1.88 m), weight 99.8 kg, SpO2 97 %. Physical Exam  Vitals reviewed. Constitutional: He is oriented to person, place, and time. He appears well-developed and well-nourished.  HENT:  Head: Normocephalic and atraumatic.  Eyes: Pupils are equal, round, and reactive to light. Conjunctivae and EOM are normal.  Neck: Normal range of motion. Neck supple.  Cardiovascular: Normal rate and regular rhythm.  Respiratory: Effort normal and breath sounds normal.  GI: Soft. Bowel sounds are normal. He exhibits no distension. There is no tenderness.  Tube in place with 222m dark green fluid  Musculoskeletal: Normal range of motion.  Neurological: He is alert and oriented to person, place, and time.  Skin: Skin is warm and dry.  Psychiatric: He has a normal mood and affect. His behavior is normal.    Assessment/Plan: 47yo male with cholecystostomy tube in place with new leukocytosis, lactic acidosis and jaundice with total bili 5.7. I would be concerned for obstructive jaundice  given labs and symptoms, but his drain is clearly draining bilious content so he appears to be decompressing via his cholecystostomy tube well. -admit to medicine -repeat CMP in am -recommend GI consultation -IV abx -NPO  LArta BruceKinsinger 10/29/2018, 7:12 PM

## 2018-10-29 NOTE — ED Notes (Signed)
Ice chips given

## 2018-10-29 NOTE — ED Notes (Signed)
Patient transported to CT 

## 2018-10-29 NOTE — H&P (Signed)
[facility] Admission History and Physical Service Pager: [phone]  Patient name: Travis [name] Medical record number: [id number] Date of birth: Oct 17, 1971 Age: 47 y.o. Gender: male  Primary Care Provider: Chino Bing, DO Consultants: gen surg by ED Code Status: DNI, confirmed with patient   Chief Complaint: vomiting, sweats  Assessment and Plan: Travis [name] is a 47 y.o. male presenting with vomiting, sweating, abd pain. PMH is significant for T2DM, esophageal reflux, HLD, CAD s/p CABG in November 30, 2017, CHF, and diabetic neuropathy.  Sepsis: question gallbladder pathology although CT abd pel with decompressed gallbladder without surrounding signs of infection. Meeting sepsis criteria with leukocytosis, lactic acidosis. qSOFA 1 for RR. Surgery consulted by ED, they will follow for cholecystostomy tube. Reports of sweating and chills at home. S/p zosyn. Blood cultures drawn. Question whether gastroparesis is contributing to vomiting and lactic acidosis. Bili elevated at 5.7, AST mildly elevated.  -admit to stepdown, attending Dr. Mingo Amber -ice chips now -NPO MN -continue zosyn per pharm -appreciate surgery recs -monitor blood cultures  T2DM: with neuropathy. Last a1c 8.0 on 10/23/18. PCP recently restarted metformin, although family thinks this is unrelated to present sxs.  -sSSI -hold metformin -lantus 10U qAM  GERD: on PPI at home -hold while NPO  HLD: at home on crestor, long term statin indication is CAD + CVA.  - resume once taking PO  CAD s/p CABG: CABG 12/18, no sxs today. - hold ASA and plavix pending surg recs  H/o multiple CVA: recent CVA for which he received TPA on 06/25/18. Family reports surgery is waiting to intervene on gallbladder until neuro clears. Had appt this week. - hold ASA and plavix pending surg recs  HFrEF: last echo 7/19 with EF 35-40%. ?hypovolemia with lactic acidosis, hold home diuresis.  -hold home  lasix  HTN: hold ARB with sepsis and lactic acidosis. No hypotension despite sepsis, will continue lower dose BB -metoprolol 12.5 mg BID  ?Hx of anemia: on iron at home, hold while acute infection.  -hold iron  Major depressive disorder:  No meds at present.   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   Incidental kidney finding: see CT abd pelv from 10/29/18.  -f/u CT or MRI w wo in 6 months  FEN/GI: ice chips now, NPO MN Prophylaxis: hep subq  Disposition: admit to stepdown   History of Present Illness:  Travis [name] is a 47 y.o. male presenting with sweats, vomiting, increased drainage from his cholecystostomy tube.  Patient presents with sister who is his caregiver.  He states he has been having worsening vomiting since Friday, and sister states that he is usually reluctant to seek care due to prolonged previous admissions.  He has a history of gastroparesis as well as "gallbladder troubles" x1 to 2 years.  He recently had a cholecystostomy tube placed in September, and up until this week and had been feeling 80% better from this.  He follows with Dr. Redmond Pulling as an outpatient, they report they are waiting to do the gallbladder removal until patient is cleared by neurology as he is status post stroke in July 2019 and cleared by cardiology as he is status post CABG in December 2018.  He has been able to take minimal p.o. over the weekend.  Reports urine seems more concentrated.  Last BM was Friday and small.  He does endorse a history of constipation.  Denies dysuria, cough.  Has not had  measured fevers at home but has sweated through multiple articles of clothing.  This feels similar to previous troubles with his gallbladder per he and his family.  He was never symptomatic with his CAD, so unclear whether this could be similar to cardiac symptoms although he denies chest pain and DOE.  Review Of Systems: Per HPI with the following additions: endorses sweats,  denies HA, cough, dysuria, BM changes.   ROS  Patient Active Problem List   Diagnosis Date Noted  . Cholecystostomy care ([place]) 09/05/2018  . Essential hypertension 06/27/2018  . Hyperlipidemia 06/27/2018  . Diabetic retinopathy ([place]) 06/27/2018  . Leukocytosis 06/27/2018  . Ischemic stroke ([place]) - mult small B infarcts s/p tPA, unknown embolic source 53/29/9242  . Left arm numbness 03/03/2018  . Strain of right trapezius muscle 03/03/2018  . Ischemic cardiomyopathy 12/16/2017  . Major depressive disorder, recurrent episode, mild ([place]) 09/16/2017  . Acute on chronic systolic and diastolic heart failure, NYHA class 3 ([place]) 09/14/2017  . CAD in native artery 09/14/2017  . Foot amputation status, left 11/19/2016  . Short Achilles tendon (acquired), left ankle 11/19/2016  . Gastroparesis due to DM ([place])   . History of diabetic ulcer of foot 11/14/2015  . Vitamin D deficiency 11/12/2015  . Osteoporosis 11/12/2015  . GERD (gastroesophageal reflux disease) 11/03/2015  . Erectile dysfunction 07/11/2009  . Allergic rhinitis 12/11/2008  . Diabetic peripheral neuropathy associated with type 2 diabetes mellitus ([place]) 08/30/2008  . Controlled type 2 diabetes mellitus with diabetic autonomic neuropathy, with long-term current use of insulin ([place]) 08/09/2007    Past Medical History: Past Medical History:  Diagnosis Date  . Acute on chronic systolic and diastolic heart failure, NYHA class 3 ([place]) 09/14/2017  . Acute osteomyelitis of metatarsal bone of left foot ([place]) 11/12/2015  . CAD in native artery 09/14/2017  . Closed fracture of right tibial plateau 11/11/2015  . Depression with anxiety   . Diabetes mellitus    INSULIN DEPENDENT Type II  . Diabetic peripheral neuropathy associated with type 2 diabetes mellitus ([place]) 08/30/2008   Qualifier: Diagnosis of  By: [name] Done FNP, Tori Milks    . Diabetic ulcer of left foot ([place]) 11/14/2015  . Gastroparesis   . GERD (gastroesophageal reflux disease)    . Hypertension   . Left ventricular aneurysm   . Osteomyelitis ([place])   . Osteoporosis 11/12/2015  . Peripheral neuropathy   . Shortness of breath dyspnea    with exertion  . Vitamin D deficiency 11/12/2015    Past Surgical History: Past Surgical History:  Procedure Laterality Date  . AMPUTATION Left 11/28/2015   Procedure: Left 4th Ray Amputation;  Surgeon: [name], MD;  Location: [facility];  Service: Orthopedics;  Laterality: Left;  . AMPUTATION Left 10/29/2016   Procedure: LEFT 5TH RAY AMPUTATION;  Surgeon: [name], MD;  Location: [facility];  Service: Orthopedics;  Laterality: Left;  . ANTERIOR VITRECTOMY Left 04/12/2016   Procedure: ANTERIOR CHAMBER Celeste OUT WITH GAS FLUID EXCHANGE;  Surgeon: [name], MD;  Location: [facility];  Service: Ophthalmology;  Laterality: Left;  . CARDIAC CATHETERIZATION    . CATARACT EXTRACTION Left   . CORONARY ARTERY BYPASS GRAFT N/A 11/29/2017    LIMA-LAD, SVG-PDA, SVG-CFX  Procedure: CORONARY ARTERY BYPASS GRAFTING (CABG) times three using the left saphaneous vien. harvested endoscopicly and left internal mammary artery.;  Surgeon: [name], MD;  Location: [facility];  Service: Open Heart Surgery;  Laterality: N/A;  . EYE SURGERY  left eye surgery 04/2016  . IR CHOLANGIOGRAM EXISTING TUBE  10/17/2018  . IR PERC CHOLECYSTOSTOMY  09/05/2018  . KNEE SURGERY Left   . LOOP RECORDER INSERTION N/A 06/28/2018   Procedure: LOOP RECORDER INSERTION;  Surgeon: Deboraha Sprang, MD;  Location: [facility];  Service: Cardiovascular;  Laterality: N/A;  . RIGHT/LEFT HEART CATH AND CORONARY ANGIOGRAPHY N/A 10/19/2017   Procedure: RIGHT/LEFT HEART CATH AND CORONARY ANGIOGRAPHY;  Surgeon: [name], [name], MD;  Location: [facility];  Service: Cardiovascular;  Laterality: N/A;  . SHOULDER SURGERY Right   . TEE WITHOUT CARDIOVERSION N/A 11/29/2017   Procedure: TRANSESOPHAGEAL ECHOCARDIOGRAM (TEE);  Surgeon: [name], Collier Salina, MD;  Location: [facility];   Service: Open Heart Surgery;  Laterality: N/A;  . TEE WITHOUT CARDIOVERSION N/A 06/28/2018   Procedure: TRANSESOPHAGEAL ECHOCARDIOGRAM (TEE);  Surgeon: [name], MD;  Location: [facility];  Service: Cardiovascular;  Laterality: N/A;  . TOE AMPUTATION Left 11/28/15   4th toe    Social History: Social History   Tobacco Use  . Smoking status: Never Smoker  . Smokeless tobacco: Never Used  Substance Use Topics  . Alcohol use: No  . Drug use: No   Additional social history: sister is caregiver  Please also refer to relevant sections of EMR.  Family History: Family History  Problem Relation Age of Onset  . Cancer Paternal Grandmother   . COPD Mother   . Heart failure Mother   . Anxiety disorder Mother   . Depression Mother   . Fibromyalgia Mother   . Esophageal cancer Father        Smoker, still does  . Diabetes Sister   . Fibromyalgia Sister   . Diabetes Maternal Uncle   . Diabetes Maternal Grandmother   . GER disease Sister   . Liver disease Paternal Grandfather   . Hemachromatosis Paternal Grandfather   . Diabetes Maternal Grandfather     Allergies and Medications: No Known Allergies No current facility-administered medications on file prior to encounter.    Current Outpatient Medications on File Prior to Encounter  Medication Sig Dispense Refill  . aspirin EC 325 MG EC tablet Take 1 tablet (325 mg total) by mouth daily. 30 tablet 0  . clopidogrel (PLAVIX) 75 MG tablet Take 1 tablet (75 mg total) by mouth daily. 30 tablet 2  . Ferrous Sulfate (IRON) 325 (65 Fe) MG TABS Take 1 tablet (325 mg total) by mouth 2 (two) times daily after a meal. 30 each 0  . furosemide (LASIX) 20 MG tablet Take 1 tablet (20 mg total) by mouth daily. 90 tablet 3  . gabapentin (NEURONTIN) 800 MG tablet TAKE 1 TABLET(800 MG) BY MOUTH THREE TIMES DAILY (Patient taking differently: Take 2,400 mg by mouth at bedtime. ) 90 tablet 0  . insulin detemir (LEVEMIR) 100 UNIT/ML injection Inject  0.2 mLs (20 Units total) into the skin at bedtime. Take 1/2 of normal dose tonight 06/27/18 for planned procedure tomorrow. Then resume normal dose (Patient taking differently: Inject 20 Units into the skin at bedtime. Take 20 units at qhs) 30 mL 3  . losartan (COZAAR) 25 MG tablet Take 12.5 mg by mouth daily.   5  . metFORMIN (GLUCOPHAGE XR) 750 MG 24 hr tablet TAKE 1 TAB DAILY FOR 1 WEEK THEN INCREASE TO 2 TABS DAILY 180 tablet 3  . metoCLOPramide (REGLAN) 10 MG tablet Take 1 tablet (10 mg total) by mouth 4 (four) times daily -  before meals and  at bedtime. (Patient taking differently: Take 10 mg by mouth 2 (two) times daily. ) 90 tablet 1  . metoprolol succinate (TOPROL XL) 25 MG 24 hr tablet Take 0.5 tablets (12.5 mg total) by mouth daily. 30 tablet 5  . pantoprazole (PROTONIX) 40 MG tablet Take 1 tablet (40 mg total) by mouth daily. 30 tablet 2  . rosuvastatin (CRESTOR) 40 MG tablet Take 1 tablet (40 mg total) by mouth daily. 30 tablet 5  . Blood Glucose Monitoring Suppl (ONETOUCH VERIO) w/Device KIT 1 application by Does not apply route 4 (four) times daily. 1 kit 0  . glucose blood (ONETOUCH VERIO) test strip 1 each by Other route as needed for other. Use as instructed 100 each 12  . ONETOUCH DELICA LANCETS 69G MISC 1 application by Does not apply route 4 (four) times daily. 100 each 12    Objective: BP (!) 151/78   Pulse 80   Temp 99.6 F (37.6 C) (Rectal)   Resp 17   Ht 6' 2" (1.88 m)   Wt 99.8 kg   SpO2 99%   BMI 28.25 kg/m  Exam: General: sitting up in bed in NAD, gagging intermittently Eyes: EOMI, PERRLA ENTM: MMM Neck: supple, normal ROM Cardiovascular: RRR, no murmur Respiratory: CTAB, no murmur Gastrointestinal: mildly TTP around perc tube, normal BSs, nondistended MSK: s/p 4,5 L ray amputation LE Derm: no rashes on visualized skin  Neuro: CN II-XII grossly intact Psych: mood and affect appropriate  Labs and Imaging: CBC BMET  Recent Labs  Lab 10/29/18 1353  WBC  14.4*  HGB 14.0  HCT 42.8  PLT 337   Recent Labs  Lab 10/29/18 1353  NA 132*  K 5.0  CL 99  CO2 16*  BUN 19  CREATININE 1.20  GLUCOSE 251*  CALCIUM 9.6     Ct Abdomen Pelvis W Contrast  Result Date: 10/29/2018 CLINICAL DATA:  Nausea and vomiting for several days. EXAM: CT ABDOMEN AND PELVIS WITH CONTRAST TECHNIQUE: Multidetector CT imaging of the abdomen and pelvis was performed using the standard protocol following bolus administration of intravenous contrast. CONTRAST:  167m OMNIPAQUE IOHEXOL 300 MG/ML  SOLN COMPARISON:  Right upper quadrant abdomen ultrasound dated 08/11/2018. Abdomen and pelvis CT dated 04/10/2017. FINDINGS: Lower chest: Right lower lobe calcified granuloma. Hepatobiliary: Cholecystostomy tube with a collapsed gallbladder. Unremarkable liver. Pancreas: Unremarkable. No pancreatic ductal dilatation or surrounding inflammatory changes. Spleen: Normal in size without focal abnormality. Adrenals/Urinary Tract: Adrenal glands are unremarkable. Interval small, heterogeneous area of decreased density in the lower pole of the left kidney, measuring 1.5 x 1.1 cm on coronal image number 63 series 6. This has an appearance most compatible with interval cortical scar in the axial plane on images 28 series of 8 and 50 of series 3. Otherwise, the kidneys are normal, without renal calculi, focal lesion, or hydronephrosis. Bladder is unremarkable. Stomach/Bowel: Stomach is within normal limits. Appendix appears normal. No evidence of bowel wall thickening, distention, or inflammatory changes. Vascular/Lymphatic: No significant vascular findings are present. No enlarged abdominal or pelvic lymph nodes. Reproductive: Prostate is unremarkable. Other: No abdominal wall hernia or abnormality. No abdominopelvic ascites. Musculoskeletal: Interval approximately 20% superior endplate compression deformity of the L3 vertebral body with Schmorl's node formation and sclerosis. No acute fracture lines  or bony retropulsion or seen. Minimal lower thoracic and lower lumbar spine degenerative changes. IMPRESSION: 1. Interval probable small cortical scar in the lower pole of the left kidney. A small focus of pyelonephritis is less likely. This  also does not have the typical appearance of a mass, especially in the axial plane, but this is not totally excluded. Therefore, a follow up abdomen CT or MRI without and with contrast is recommended in 6 months. 2. Cholecystostomy tube with a collapsed gallbladder. Electronically Signed   By: Claudie Revering M.D.   On: 10/29/2018 16:29   Dg Chest Portable 1 View  Result Date: 10/29/2018 CLINICAL DATA:  Shortness of breath. EXAM: PORTABLE CHEST 1 VIEW COMPARISON:  Chest x-ray dated January 17, 2018. FINDINGS: Heart remains at the upper limits of normal in size status post CABG. Interval placement of a loop recorder. Normal pulmonary vascularity. No focal consolidation, pleural effusion, or pneumothorax. No acute osseous abnormality. IMPRESSION: No active disease. Electronically Signed   By: Titus Dubin M.D.   On: 10/29/2018 16:28     Sela Hilding, MD 10/29/2018, 6:02 PM PGY-3, [place] Intern pager: [phone], text pages welcome

## 2018-10-29 NOTE — ED Provider Notes (Signed)
  Physical Exam  BP (!) 142/78   Pulse 80   Temp 99.6 F (37.6 C) (Rectal)   Resp 17   Ht 6\' 2"  (1.88 m)   Wt 99.8 kg   SpO2 97%   BMI 28.25 kg/m   Physical Exam Performed by initial EDP, Travis PearMia McDonald, Travis [name]. ED Course/Procedures   Clinical Course as of Oct 29 1753  Sun Oct 29, 2018  1604 Case discussed with Travis PearMia McDonald, Travis [name] at shift change. Admission is likely given clinical presentation and evaluation to this point. General surgery is aware of this patient. Will formally consult them once CT abd/pelvis results.   [GM]  1701 CT abdomen shows cholecystomy tube with collapsed gallbladder. No other acute intra-abdominal findings appreciated. Repeat lactic acid remains elevated. Will consult general surgery to discuss admission.   [GM]  1735 Case discussed with Dr. Francena HanlyKisinger with general surgery who will follow patient during admission. Consult placed to family medicine to discuss admission.   [GM]  1754 Case discussed with Dr. Selena BattenKim with family medicine who will admit patient.   [GM]    Clinical Course User Index [GM] Windy CarinaMortis, [name], [place]PA-C    Procedures  MDM  [name] is a 47 yo male with a medical history of heart failure, CAD, Type 2 DM and HTN who is ~ 2 months s/p biliary drain placement (09/05/18) who presented to the ED for N/V. Work-up performed today is indicative of an acute abdomen and infection likely due to this drain. He is jaundiced has had increased output from his drain and has new elevated LFTs. Patient will be admitted to family medicine service and will be followed by general surgery.  Dispo: Admit.      [name], [name], Travis [name] 10/29/18 1754    [name], [name], MD 10/29/18 2358

## 2018-10-29 NOTE — ED Provider Notes (Addendum)
[facility] EMERGENCY DEPARTMENT Provider Note   CSN: [id number] Arrival date & time: 10/29/18  1341     History   Chief Complaint Chief Complaint  Patient presents with  . Abdominal Pain    HPI Travis [name] is a 47 y.o. 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  The patient endorses nausea and vomiting that began 2 to 3 days ago with associated chills and abdominal pain.  He reports earlier today his emesis became dark, but he denies seeing bright red blood.  EMS reports he was given 4 mg of Zofran in route with no improvement in his vomiting.  The patient reports he is also been having abdominal pain that is been in the vicinity of his cholecystostomy drain that was placed during an admission on September 06, 2018 when he was found to have cholecystitis, but was not a surgical, candidate given multiple comorbidities.  He is unable to characterize the pain.  He denies back pain, diarrhea, melena, hematochezia, dysuria, hematuria, chest pain, dizziness, or lightheadedness.  The patient's wife reports his drain output has increased to 300 cc for the last 2 days after it had previously been putting out 100 cc daily.  The history is provided by the patient. No language interpreter was used.    Past Medical History:  Diagnosis Date  . Acute on chronic systolic and diastolic heart failure, NYHA class 3 ([place]) 09/14/2017  . Acute osteomyelitis of metatarsal bone of left foot ([place]) 11/12/2015  . CAD in native artery 09/14/2017  . Closed fracture of right tibial plateau 11/11/2015  . Depression with anxiety   . Diabetes mellitus    INSULIN DEPENDENT Type II  . Diabetic peripheral neuropathy associated with  type 2 diabetes mellitus ([place]) 08/30/2008   Qualifier: Diagnosis of  By: [name] Done FNP, Tori Milks    . Diabetic ulcer of left foot ([place]) 11/14/2015  . Gastroparesis   . GERD (gastroesophageal reflux disease)   . Hypertension   . Left ventricular aneurysm   . Osteomyelitis ([place])   . Osteoporosis 11/12/2015  . Peripheral neuropathy   . Shortness of breath dyspnea    with exertion  . Vitamin D deficiency 11/12/2015    Patient Active Problem List   Diagnosis Date Noted  . Intractable vomiting   . Sepsis ([place]) 10/29/2018  . Abnormal finding on imaging 10/29/2018  . Cholecystostomy care ([place]) 09/05/2018  . Essential hypertension 06/27/2018  . Hyperlipidemia 06/27/2018  . Diabetic retinopathy ([place]) 06/27/2018  . Leukocytosis 06/27/2018  . Ischemic stroke ([place]) - mult small B infarcts s/p tPA, unknown embolic source 40/98/1191  . Left arm numbness 03/03/2018  . Strain of right trapezius muscle 03/03/2018  . Ischemic cardiomyopathy 12/16/2017  . Major depressive disorder, recurrent episode, mild ([place]) 09/16/2017  . Acute on chronic systolic and diastolic heart failure, NYHA class 3 ([place]) 09/14/2017  . CAD in native artery 09/14/2017  . S/P BKA (below knee amputation) unilateral, left ([place]) 11/19/2016  . Short Achilles tendon (acquired), left ankle 11/19/2016  . Gastroparesis due to DM ([place])   . History of diabetic ulcer of foot 11/14/2015  . Vitamin D deficiency 11/12/2015  . Osteoporosis 11/12/2015  . GERD (gastroesophageal reflux disease) 11/03/2015  . Erectile dysfunction 07/11/2009  . Allergic rhinitis 12/11/2008  .  Diabetic peripheral neuropathy associated with type 2 diabetes mellitus ([place]) 08/30/2008  . Controlled type 2 diabetes mellitus with diabetic autonomic neuropathy, with long-term current use of insulin ([place]) 08/09/2007    Past Surgical History:  Procedure Laterality Date  . AMPUTATION Left 11/28/2015   Procedure: Left 4th Ray Amputation;  Surgeon: [name], MD;   Location: [facility];  Service: Orthopedics;  Laterality: Left;  . AMPUTATION Left 10/29/2016   Procedure: LEFT 5TH RAY AMPUTATION;  Surgeon: [name], MD;  Location: [place];  Service: Orthopedics;  Laterality: Left;  . ANTERIOR VITRECTOMY Left 04/12/2016   Procedure: ANTERIOR CHAMBER [place] OUT WITH GAS FLUID EXCHANGE;  Surgeon: [name], MD;  Location: [place];  Service: Ophthalmology;  Laterality: Left;  . CARDIAC CATHETERIZATION    . CATARACT EXTRACTION Left   . CORONARY ARTERY BYPASS GRAFT N/A 11/29/2017    LIMA-LAD, SVG-PDA, SVG-CFX  Procedure: CORONARY ARTERY BYPASS GRAFTING (CABG) times three using the left saphaneous vien. harvested endoscopicly and left internal mammary artery.;  Surgeon: [name], MD;  Location: [place];  Service: Open Heart Surgery;  Laterality: N/A;  . EYE SURGERY     left eye surgery 04/2016  . IR CHOLANGIOGRAM EXISTING TUBE  10/17/2018  . IR CHOLANGIOGRAM EXISTING TUBE  10/30/2018  . IR PERC CHOLECYSTOSTOMY  09/05/2018  . KNEE SURGERY Left   . LOOP RECORDER INSERTION N/A 06/28/2018   Procedure: LOOP RECORDER INSERTION;  Surgeon: Deboraha Sprang, MD;  Location: [place];  Service: Cardiovascular;  Laterality: N/A;  . RIGHT/LEFT HEART CATH AND CORONARY ANGIOGRAPHY N/A 10/19/2017   Procedure: RIGHT/LEFT HEART CATH AND CORONARY ANGIOGRAPHY;  Surgeon: [name], [name], MD;  Location: [place] [facility];  Service: Cardiovascular;  Laterality: N/A;  . SHOULDER SURGERY Right   . TEE WITHOUT CARDIOVERSION N/A 11/29/2017   Procedure: TRANSESOPHAGEAL ECHOCARDIOGRAM (TEE);  Surgeon: [name], Collier Salina, MD;  Location: [place];  Service: Open Heart Surgery;  Laterality: N/A;  . TEE WITHOUT CARDIOVERSION N/A 06/28/2018   Procedure: TRANSESOPHAGEAL ECHOCARDIOGRAM (TEE);  Surgeon: [name], MD;  Location: [facility] END[facility];  Service: Cardiovascular;  Laterality: N/A;  . TOE AMPUTATION Left 11/28/15   4th toe        Home Medications    Prior to Admission  medications   Medication Sig Start Date End Date Taking? Authorizing Provider  aspirin EC 325 MG EC tablet Take 1 tablet (325 mg total) by mouth daily. 12/04/17  Yes Barrett, Lodema Hong, PA-C  Ferrous Sulfate (IRON) 325 (65 Fe) MG TABS Take 1 tablet (325 mg total) by mouth 2 (two) times daily after a meal. 12/04/17  Yes Barrett, Erin R, PA-C  furosemide (LASIX) 20 MG tablet Take 1 tablet (20 mg total) by mouth daily. 09/22/18  Yes Skeet Latch, MD  gabapentin (NEURONTIN) 800 MG tablet TAKE 1 TABLET(800 MG) BY MOUTH THREE TIMES DAILY Patient taking differently: Take 2,400 mg by mouth at bedtime.  08/17/18  Yes [place] Bing, DO  insulin detemir (LEVEMIR) 100 UNIT/ML injection Inject 0.2 mLs (20 Units total) into the skin at bedtime. Take 1/2 of normal dose tonight 06/27/18 for planned procedure tomorrow. Then resume normal dose Patient taking differently: Inject 20 Units into the skin at bedtime. Take 20 units at qhs 06/27/18  Yes Biby, Massie Kluver, NP  losartan (COZAAR) 25 MG tablet Take 12.5 mg by mouth daily.  07/12/18  Yes [provider]  metFORMIN (GLUCOPHAGE XR) 750 MG 24 hr tablet TAKE  1 TAB DAILY FOR 1 WEEK THEN INCREASE TO 2 TABS DAILY 10/23/18  Yes [place] Bing, DO  metoprolol succinate (TOPROL XL) 25 MG 24 hr tablet Take 0.5 tablets (12.5 mg total) by mouth daily. 01/23/18  Yes Barrett, Evelene Croon, PA-C  pantoprazole (PROTONIX) 40 MG tablet Take 1 tablet (40 mg total) by mouth daily. 10/05/18  Yes Gem Bing, DO  rosuvastatin (CRESTOR) 40 MG tablet Take 1 tablet (40 mg total) by mouth daily. 09/14/17 06/25/25 Yes Skeet Latch, MD  Blood Glucose Monitoring Suppl [facility] VERIO) w/Device KIT 1 application by Does not apply route 4 (four) times daily. 09/13/18   [place] Bing, DO  clopidogrel (PLAVIX) 75 MG tablet Take 1 tablet (75 mg total) by mouth daily. 10/30/18   Garvin Fila, MD  erythromycin (E-MYCIN) 250 MG tablet Take 1 tablet (250 mg total) by mouth 3  (three) times daily before meals. 11/02/18   Sela Hilding, MD  glucose blood (ONETOUCH VERIO) test strip 1 each by Other route as needed for other. Use as instructed 09/18/18   [place] Bing, DO  losartan (COZAAR) 25 MG tablet Take 0.5 tablets (12.5 mg total) by mouth daily. 1/2 tablet by mouth daily 11/03/18   Skeet Latch, MD  metoprolol succinate (TOPROL-XL) 25 MG 24 hr tablet Take 0.5 tablets (12.5 mg total) by mouth daily. 11/03/18   Skeet Latch, MD  ondansetron (ZOFRAN) 4 MG tablet Take 1 tablet (4 mg total) by mouth every 8 (eight) hours as needed for nausea or vomiting. 11/02/18   Kathrene Alu, MD  ondansetron (ZOFRAN) 4 MG/2ML SOLN injection Inject 2 mLs (4 mg total) into the vein every 6 (six) hours as needed for nausea. 11/02/18   Sela Hilding, MD  [facility] DELICA LANCETS 27X MISC 1 application by Does not apply route 4 (four) times daily. 09/18/18   Cayce Bing, DO    Family History Family History  Problem Relation Age of Onset  . Cancer Paternal Grandmother   . COPD Mother   . Heart failure Mother   . Anxiety disorder Mother   . Depression Mother   . Fibromyalgia Mother   . Esophageal cancer Father        Smoker, still does  . Diabetes Sister   . Fibromyalgia Sister   . Diabetes Maternal Uncle   . Diabetes Maternal Grandmother   . GER disease Sister   . Liver disease Paternal Grandfather   . Hemachromatosis Paternal Grandfather   . Diabetes Maternal Grandfather     Social History Social History   Tobacco Use  . Smoking status: Never Smoker  . Smokeless tobacco: Never Used  Substance Use Topics  . Alcohol use: No  . Drug use: No     Allergies   Patient has no known allergies.   Review of Systems Review of Systems  Constitutional: Positive for chills and diaphoresis. Negative for appetite change and fever.  Respiratory: Negative for cough and shortness of breath.   Cardiovascular: Negative for chest pain, palpitations  and leg swelling.  Gastrointestinal: Positive for abdominal pain, nausea and vomiting. Negative for abdominal distention, anal bleeding, blood in stool, constipation, diarrhea and rectal pain.  Genitourinary: Negative for dysuria, flank pain, penile pain, testicular pain and urgency.  Musculoskeletal: Negative for back pain.  Skin: Negative for rash.  Allergic/Immunologic: Negative for immunocompromised state.  Neurological: Negative for weakness, numbness and headaches.  Psychiatric/Behavioral: Negative for confusion.   Physical Exam Updated Vital Signs BP (!) 142/85 (  BP Location: Left Arm)   Pulse 64   Temp 98.6 F (37 C) (Oral)   Resp 19   Ht '6\' 2"'  (1.88 m)   Wt 99 kg   SpO2 100%   BMI 28.02 kg/m   Physical Exam  Constitutional: He appears distressed.  Ill-appearing.  Jaundiced.  HENT:  Head: Normocephalic.  Eyes: Conjunctivae are normal.  Neck: Neck supple.  Cardiovascular: Normal rate, regular rhythm, normal heart sounds and intact distal pulses. Exam reveals no gallop and no friction rub.  No murmur heard. Pulmonary/Chest: Effort normal. No stridor. No respiratory distress. He has no wheezes. He has no rales. He exhibits no tenderness.  Abdominal: Soft. He exhibits no distension and no mass. There is tenderness. There is no rebound and no guarding. No hernia.  Cholecystostomy drain in place in the right upper quadrant.  No surrounding erythema, edema, warmth to the skin.  Tender to palpation in the epigastric and right upper quadrant with mild guarding.  No rebound.  Abdomen is mildly distended, but soft.  Normoactive bowel sounds.  Musculoskeletal: Normal range of motion. He exhibits no edema, tenderness or deformity.  Neurological: He is alert.  Skin: Skin is warm. He is diaphoretic.  Psychiatric: His behavior is normal.  Nursing note and vitals reviewed.    ED Treatments / Results  Labs (all labs ordered are listed, but only abnormal results are displayed) Labs  Reviewed  CBC WITH DIFFERENTIAL/PLATELET - Abnormal; Notable for the following components:      Result Value   WBC 14.4 (*)    Neutro Abs 11.9 (*)    All other components within normal limits  COMPREHENSIVE METABOLIC PANEL - Abnormal; Notable for the following components:   Sodium 132 (*)    CO2 16 (*)    Glucose, Bld 251 (*)    AST 42 (*)    Total Bilirubin 5.7 (*)    Anion gap 17 (*)    All other components within normal limits  URINALYSIS, ROUTINE W REFLEX MICROSCOPIC - Abnormal; Notable for the following components:   Specific Gravity, Urine >1.046 (*)    Glucose, UA >=500 (*)    Hgb urine dipstick MODERATE (*)    Ketones, ur 80 (*)    Protein, ur 100 (*)    All other components within normal limits  COMPREHENSIVE METABOLIC PANEL - Abnormal; Notable for the following components:   Potassium 3.3 (*)    Glucose, Bld 214 (*)    Creatinine, Ser 1.30 (*)    Calcium 8.5 (*)    Total Bilirubin 3.1 (*)    All other components within normal limits  CBC - Abnormal; Notable for the following components:   WBC 12.2 (*)    RBC 3.94 (*)    Hemoglobin 11.3 (*)    HCT 33.9 (*)    All other components within normal limits  GLUCOSE, CAPILLARY - Abnormal; Notable for the following components:   Glucose-Capillary 225 (*)    All other components within normal limits  GLUCOSE, CAPILLARY - Abnormal; Notable for the following components:   Glucose-Capillary 190 (*)    All other components within normal limits  GLUCOSE, CAPILLARY - Abnormal; Notable for the following components:   Glucose-Capillary 152 (*)    All other components within normal limits  GLUCOSE, CAPILLARY - Abnormal; Notable for the following components:   Glucose-Capillary 135 (*)    All other components within normal limits  CBC - Abnormal; Notable for the following components:   RBC 4.03 (*)  Hemoglobin 11.6 (*)    HCT 34.7 (*)    All other components within normal limits  COMPREHENSIVE METABOLIC PANEL - Abnormal;  Notable for the following components:   Sodium 134 (*)    Potassium 3.0 (*)    CO2 21 (*)    Glucose, Bld 132 (*)    Calcium 8.4 (*)    Total Protein 6.2 (*)    Albumin 3.2 (*)    Total Bilirubin 2.2 (*)    All other components within normal limits  GLUCOSE, CAPILLARY - Abnormal; Notable for the following components:   Glucose-Capillary 125 (*)    All other components within normal limits  GLUCOSE, CAPILLARY - Abnormal; Notable for the following components:   Glucose-Capillary 116 (*)    All other components within normal limits  GLUCOSE, CAPILLARY - Abnormal; Notable for the following components:   Glucose-Capillary 122 (*)    All other components within normal limits  GLUCOSE, CAPILLARY - Abnormal; Notable for the following components:   Glucose-Capillary 110 (*)    All other components within normal limits  CBC - Abnormal; Notable for the following components:   RBC 4.14 (*)    Hemoglobin 12.0 (*)    HCT 34.7 (*)    All other components within normal limits  COMPREHENSIVE METABOLIC PANEL - Abnormal; Notable for the following components:   Sodium 131 (*)    Potassium 2.9 (*)    Glucose, Bld 101 (*)    Calcium 8.4 (*)    Total Protein 6.1 (*)    Albumin 3.2 (*)    Total Bilirubin 2.4 (*)    All other components within normal limits  GLUCOSE, CAPILLARY - Abnormal; Notable for the following components:   Glucose-Capillary 120 (*)    All other components within normal limits  GLUCOSE, CAPILLARY - Abnormal; Notable for the following components:   Glucose-Capillary 131 (*)    All other components within normal limits  CBC - Abnormal; Notable for the following components:   Hemoglobin 12.7 (*)    HCT 37.6 (*)    All other components within normal limits  COMPREHENSIVE METABOLIC PANEL - Abnormal; Notable for the following components:   Sodium 132 (*)    CO2 21 (*)    Glucose, Bld 111 (*)    Calcium 8.6 (*)    Total Protein 6.3 (*)    Albumin 3.3 (*)    Total Bilirubin  2.0 (*)    All other components within normal limits  GLUCOSE, CAPILLARY - Abnormal; Notable for the following components:   Glucose-Capillary 113 (*)    All other components within normal limits  I-STAT CG4 LACTIC ACID, ED - Abnormal; Notable for the following components:   Lactic Acid, Venous 4.58 (*)    All other components within normal limits  CBG MONITORING, ED - Abnormal; Notable for the following components:   Glucose-Capillary 243 (*)    All other components within normal limits  I-STAT CG4 LACTIC ACID, ED - Abnormal; Notable for the following components:   Lactic Acid, Venous 3.24 (*)    All other components within normal limits  CULTURE, BLOOD (ROUTINE X 2)  CULTURE, BLOOD (ROUTINE X 2)  LIPASE, BLOOD  PROTIME-INR  LACTIC ACID, PLASMA  LACTIC ACID, PLASMA  PROTIME-INR  MAGNESIUM  GLUCOSE, CAPILLARY  GLUCOSE, CAPILLARY  GLUCOSE, CAPILLARY  POC OCCULT BLOOD, ED    EKG EKG Interpretation  Date/Time:  Sunday October 29 2018 13:51:37 EST Ventricular Rate:  91 PR Interval:  QRS Duration: 128 QT Interval:  406 QTC Calculation: 503 R Axis:   137 Text Interpretation:  Sinus rhythm Nonspecific intraventricular conduction delay Inferior infarct, old Anterolateral infarct, age indeterminate Confirmed by Davonna Belling [phone]) on 10/29/2018 3:28:18 PM   Radiology No results found.  Procedures .Critical Care Performed by: Joanne Gavel, PA-C Authorized by: Joanne Gavel, PA-C   Critical care provider statement:    Critical care time (minutes):  45   Critical care time was exclusive of:  Separately billable procedures and treating other patients and teaching time   Critical care was necessary to treat or prevent imminent or life-threatening deterioration of the following conditions:  Sepsis   Critical care was time spent personally by me on the following activities:  Ordering and performing treatments and interventions, ordering and review of laboratory  studies, ordering and review of radiographic studies, pulse oximetry, re-evaluation of patient's condition, evaluation of patient's response to treatment, examination of patient, obtaining history from patient or surrogate, review of old charts and development of treatment plan with patient or surrogate   (including critical care time)  Medications Ordered in ED Medications  0.9 %  sodium chloride infusion ( Intravenous New Bag/Given 10/30/18 0636)  LORazepam (ATIVAN) injection 1 mg (1 mg Intravenous Given 10/29/18 1403)  sodium chloride 0.9 % bolus 1,000 mL (0 mLs Intravenous Stopped 10/29/18 1809)  piperacillin-tazobactam (ZOSYN) IVPB 3.375 g (0 g Intravenous Stopped 10/29/18 1703)  morphine 4 MG/ML injection 4 mg (4 mg Intravenous Given 10/29/18 1610)  iohexol (OMNIPAQUE) 300 MG/ML solution 100 mL (100 mLs Intravenous Contrast Given 10/29/18 1535)  LORazepam (ATIVAN) injection 0.5 mg (0.5 mg Intravenous Given 10/29/18 1609)  ondansetron (ZOFRAN) injection 4 mg (4 mg Intravenous Given 10/29/18 2347)  ondansetron (ZOFRAN) injection 4 mg (4 mg Intravenous Given 10/30/18 1247)  iopamidol (ISOVUE-300) 61 % injection (40 mLs  Contrast Given 10/30/18 1409)  LORazepam (ATIVAN) injection 0.5 mg (0.5 mg Intravenous Given 10/31/18 1617)  potassium chloride 10 mEq in 100 mL IVPB (0 mEq Intravenous Stopped 11/01/18 1958)     Initial Impression / Assessment and Plan / ED Course  I have reviewed the triage vital signs and the nursing notes.  Pertinent labs & imaging results that were available during my care of the patient were reviewed by me and considered in my medical decision making (see chart for details).  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  He was admitted for cholecystitis and a cholecystostomy drain was placed at the end of September.  Family reports increased drain output for the last 2 days.  On exam, the patient appears jaundiced, ill-appearing, and is diaphoretic.  Rectal temp 99 on arrival with no tachycardia, but the patient appears tachypneic.   Initial lactate 4.58.  No crackles auscultated on initial pulmonary exam.  The patient was given a 1 L fluid bolus.  Repeat lactate improved to 3.24.  Portal chest x-ray is unremarkable and does not show pulmonary vascular congestion.   Initial leukocytosis is 14.4 with an elevated absolute neutrophil count.  Glucose is 251, bicarb 16, and anion gap is 17, likely secondary to vomiting as opposed to lactic acidosis.  AST is 42, total bilirubin 5.7.  Blood cultures x2 have been ordered and the patient was  started on Zosyn to cover for cholangitis  The patient was given Ativan for pain control since the QTC was 501 on EKG.  Morphine given for pain.  The patient also endorsed that his emesis was getting darker.  Given concern for hematemesis, Hemoccult was checked, which was negative.  CT abdomen is pending.  I anticipate a surgical consult and likely admission given the patient's appearance and initial labs.  The patient was discussed and independently evaluated with Dr. Vanita Panda, attending physician. Patient care transferred to PA [name] at the end of my shift. Patient presentation, ED course, and plan of care discussed with review of all pertinent labs and imaging. Please see his/her note for further details regarding further ED course and disposition.   Clinical Course as of Nov 09 236  Sun Oct 29, 2018  1604 Case discussed with Joline Maxcy, PA-C at shift change. Admission is likely given clinical presentation and evaluation to this point. General surgery is aware of this patient. Will formally consult them once CT abd/pelvis results.   [GM]  1701 CT abdomen  shows cholecystomy tube with collapsed gallbladder. No other acute intra-abdominal findings appreciated. Repeat lactic acid remains elevated. Will consult general surgery to discuss admission.   [GM]  2111 Case discussed with Dr. Reece Agar with general surgery who will follow patient during admission. Consult placed to family medicine to discuss admission.   [GM]  7356 Case discussed with Dr. Maudie Mercury with family medicine who will admit patient.   [GM]    Clinical Course User Index [GM] Romie Jumper, [place]    Final Clinical Impressions(s) / ED Diagnoses   Final diagnoses:  Intractable vomiting with nausea, unspecified vomiting type  Sepsis [facility])    ED Discharge Orders         Ordered    erythromycin (E-MYCIN) 250 MG tablet  3 times daily before meals     11/02/18 1010    ondansetron (ZOFRAN) 4 MG/2ML SOLN injection  Every 6 hours PRN     11/02/18 1010    Increase activity slowly     11/02/18 1014    Diet - low sodium heart healthy     11/02/18 1014    Discharge instructions    Comments:  Follow Up as scheduled, use your same insulin dose and same metformin. Use zofran as needed. Start taking erythromycin as directed. Call if concerns.   11/02/18 1014           Joanne Gavel, PA-C 10/29/18 1716    Joanne Gavel, PA-C 10/30/18 1523    Carmin Muskrat, MD 11/01/18 1255    Kelce Bouton A, PA-C 11/09/18 7014    Carmin Muskrat, MD 11/14/18 1924

## 2018-10-29 NOTE — ED Triage Notes (Signed)
Pt reports several days of Nausea and Vomiting. EMS reports giving Zofran for Nausea with out relief. Pt also reports chills. Pt has a new placement of Bilary duct drain. Pt unable to have Surgery because of medical HX.

## 2018-10-29 NOTE — ED Notes (Signed)
EDP Jeraldine LootsLockwood notified of lactic acid

## 2018-10-30 ENCOUNTER — Other Ambulatory Visit: Payer: Self-pay

## 2018-10-30 ENCOUNTER — Encounter (HOSPITAL_COMMUNITY): Payer: Self-pay | Admitting: Interventional Radiology

## 2018-10-30 ENCOUNTER — Inpatient Hospital Stay (HOSPITAL_COMMUNITY): Payer: Medicare Other

## 2018-10-30 DIAGNOSIS — Z794 Long term (current) use of insulin: Secondary | ICD-10-CM

## 2018-10-30 DIAGNOSIS — E1143 Type 2 diabetes mellitus with diabetic autonomic (poly)neuropathy: Secondary | ICD-10-CM

## 2018-10-30 DIAGNOSIS — R112 Nausea with vomiting, unspecified: Secondary | ICD-10-CM

## 2018-10-30 DIAGNOSIS — A419 Sepsis, unspecified organism: Principal | ICD-10-CM

## 2018-10-30 DIAGNOSIS — K3184 Gastroparesis: Secondary | ICD-10-CM

## 2018-10-30 DIAGNOSIS — R1110 Vomiting, unspecified: Secondary | ICD-10-CM

## 2018-10-30 DIAGNOSIS — Z434 Encounter for attention to other artificial openings of digestive tract: Secondary | ICD-10-CM

## 2018-10-30 HISTORY — PX: IR CHOLANGIOGRAM EXISTING TUBE: IMG6040

## 2018-10-30 LAB — GLUCOSE, CAPILLARY
Glucose-Capillary: 190 mg/dL — ABNORMAL HIGH (ref 70–99)
Glucose-Capillary: 152 mg/dL — ABNORMAL HIGH (ref 70–99)
Glucose-Capillary: 135 mg/dL — ABNORMAL HIGH (ref 70–99)
Glucose-Capillary: 125 mg/dL — ABNORMAL HIGH (ref 70–99)

## 2018-10-30 LAB — COMPREHENSIVE METABOLIC PANEL
ALBUMIN: 3.5 g/dL (ref 3.5–5.0)
ALT: 25 U/L (ref 0–44)
AST: 21 U/L (ref 15–41)
Alkaline Phosphatase: 64 U/L (ref 38–126)
Anion gap: 9 (ref 5–15)
BILIRUBIN TOTAL: 3.1 mg/dL — AB (ref 0.3–1.2)
BUN: 18 mg/dL (ref 6–20)
CHLORIDE: 104 mmol/L (ref 98–111)
CO2: 23 mmol/L (ref 22–32)
Calcium: 8.5 mg/dL — ABNORMAL LOW (ref 8.9–10.3)
Creatinine, Ser: 1.3 mg/dL — ABNORMAL HIGH (ref 0.61–1.24)
GFR calc Af Amer: >60 mL/min (ref >60)
GFR calc non Af Amer: >60 mL/min (ref >60)
GLUCOSE: 214 mg/dL — AB (ref 70–99)
POTASSIUM: 3.3 mmol/L — AB (ref 3.5–5.1)
Sodium: 136 mmol/L (ref 135–145)
Total Protein: 6.7 g/dL (ref 6.5–8.1)

## 2018-10-30 LAB — CBC
HCT: 33.9 % — ABNORMAL LOW (ref 39.0–52.0)
HEMOGLOBIN: 11.3 g/dL — AB (ref 13.0–17.0)
MCH: 28.7 pg (ref 26.0–34.0)
MCHC: 33.3 g/dL (ref 30.0–36.0)
MCV: 86 fL (ref 80.0–100.0)
Platelets: 276 10*3/uL (ref 150–400)
RBC: 3.94 MIL/uL — ABNORMAL LOW (ref 4.22–5.81)
RDW: 12.7 % (ref 11.5–15.5)
WBC: 12.2 10*3/uL — ABNORMAL HIGH (ref 4.0–10.5)
nRBC: 0 % (ref 0.0–0.2)

## 2018-10-30 LAB — PROTIME-INR
INR: 1.21
PROTHROMBIN TIME: 15.2 s (ref 11.4–15.2)

## 2018-10-30 MED ORDER — IOPAMIDOL (ISOVUE-300) INJECTION 61%
INTRAVENOUS | Status: AC
  Administered 2018-10-30: 40 mL
  Filled 2018-10-30: qty 50

## 2018-10-30 MED ORDER — SODIUM CHLORIDE 0.9 % IV SOLN
8.0000 mg | Freq: Four times a day (QID) | INTRAVENOUS | Status: DC | PRN
  Filled 2018-10-30: qty 4

## 2018-10-30 MED ORDER — ONDANSETRON HCL 4 MG/2ML IJ SOLN
4.0000 mg | Freq: Once | INTRAMUSCULAR | Status: AC
  Administered 2018-10-30: 4 mg via INTRAVENOUS
  Filled 2018-10-30: qty 2

## 2018-10-30 MED ORDER — CLOPIDOGREL BISULFATE 75 MG PO TABS
75.0000 mg | ORAL_TABLET | Freq: Every day | ORAL | Status: DC
  Administered 2018-10-30 – 2018-11-01 (×3): 75 mg via ORAL
  Filled 2018-10-30 (×3): qty 1

## 2018-10-30 MED ORDER — ONDANSETRON HCL 4 MG/2ML IJ SOLN
4.0000 mg | Freq: Four times a day (QID) | INTRAMUSCULAR | Status: DC | PRN
  Administered 2018-10-30 – 2018-11-02 (×8): 4 mg via INTRAVENOUS
  Filled 2018-10-30 (×8): qty 2

## 2018-10-30 MED ORDER — GLYCERIN (LAXATIVE) 2.1 G RE SUPP
1.0000 | Freq: Once | RECTAL | Status: DC
  Filled 2018-10-30: qty 1

## 2018-10-30 MED ORDER — ONDANSETRON HCL 4 MG/2ML IJ SOLN
4.0000 mg | Freq: Four times a day (QID) | INTRAMUSCULAR | Status: DC | PRN
  Administered 2018-10-30: 4 mg via INTRAVENOUS
  Filled 2018-10-30: qty 2

## 2018-10-30 MED ORDER — PROMETHAZINE HCL 25 MG/ML IJ SOLN: 12.5000 mg | Freq: Three times a day (TID) | INTRAMUSCULAR | Status: DC | PRN

## 2018-10-30 MED ORDER — ASPIRIN EC 325 MG PO TBEC
325.0000 mg | DELAYED_RELEASE_TABLET | Freq: Every day | ORAL | Status: DC
  Administered 2018-10-30 – 2018-11-01 (×3): 325 mg via ORAL
  Filled 2018-10-30 (×3): qty 1

## 2018-10-30 MED ORDER — CLOPIDOGREL BISULFATE 75 MG PO TABS: 75.0000 mg | ORAL_TABLET | Freq: Every day | ORAL | 1 refills | Status: DC

## 2018-10-30 NOTE — Progress Notes (Signed)
[facility] Progress Note     Subjective: CC-  Patient states that he feels about the same. Reports little abdominal pain at this time, main complaint is persistent nausea and dry heaves. Perc chole tube with dark bilious output in bag.  WBC trending down, lactic acidosis cleared, bilirubin trending down 3.1 from 5.7.  Objective: Vital signs in last 24 hours: Temp:  [97.3 F (36.3 C)-99.6 F (37.6 C)] 99.4 F (37.4 C) (11/18 0824) Pulse Rate:  [78-95] 78 (11/18 0824) Resp:  [15-31] 17 (11/18 0824) BP: (119-153)/(61-89) 119/71 (11/18 0824) SpO2:  [93 %-100 %] 97 % (11/18 0824) Weight:  [99 kg-99.8 kg] 99 kg (11/18 0035) Last BM Date: 10/27/18  Intake/Output from previous day: 11/17 0701 - 11/18 0700 In: 2220.4 [I.V.:1120.4; IV Piggyback:1100] Out: 500 [Urine:250; Drains:250] Intake/Output this shift: No intake/output data recorded.  PE: Gen:  Alert, NAD HEENT: EOM's intact, pupils equal and round Pulm:  effort normal Abd: Soft, ND, NT, +BS, no HSM, perc chole tube in place with dark bilious drainage in bag Skin: no rashes noted, warm and dry  Lab Results:  Recent Labs    10/29/18 1353 10/30/18 0319  WBC 14.4* 12.2*  HGB 14.0 11.3*  HCT 42.8 33.9*  PLT 337 276   BMET Recent Labs    10/29/18 1353 10/30/18 0319  NA 132* 136  K 5.0 3.3*  CL 99 104  CO2 16* 23  GLUCOSE 251* 214*  BUN 19 18  CREATININE 1.20 1.30*  CALCIUM 9.6 8.5*   PT/INR Recent Labs    10/29/18 1859 10/30/18 0319  LABPROT 15.1 15.2  INR 1.20 1.21   CMP     Component Value Date/Time   NA 136 10/30/2018 0319   NA 136 10/04/2018 1157   K 3.3 (L) 10/30/2018 0319   CL 104 10/30/2018 0319   CO2 23 10/30/2018 0319   GLUCOSE 214 (H) 10/30/2018 0319   BUN 18 10/30/2018 0319   BUN 12 10/04/2018 1157   CREATININE 1.30 (H) 10/30/2018 0319   CREATININE 0.81 04/30/2016 1338   CALCIUM 8.5 (L) 10/30/2018 0319   CALCIUM 9.0 11/11/2015 1025   PROT 6.7 10/30/2018 0319   PROT  6.2 01/23/2018 0913   ALBUMIN 3.5 10/30/2018 0319   ALBUMIN 3.9 02/11/2Darrick PeOweArmed [id number] [place] [id number] /30/2019. Abdomen and pelvis CT dated 04/10/2017. FINDINGS: Lower chest: Right lower lobe calcified granuloma. Hepatobiliary: Cholecystostomy tube with a collapsed gallbladder. Unremarkable liver. Pancreas: Unremarkable. No pancreatic ductal dilatation or surrounding inflammatory changes. Spleen: Normal in size without focal abnormality. Adrenals/Urinary Tract: Adrenal glands are unremarkable. Interval small, heterogeneous area of decreased density in the lower pole of the left kidney, measuring 1.5 x 1.1 cm  on coronal image number 63 series 6. This has an appearance most compatible with interval cortical scar in the axial plane on images 28 series of 8 and 50 of series 3. Otherwise, the kidneys are normal, without renal calculi, focal lesion, or hydronephrosis. Bladder is unremarkable. Stomach/Bowel: Stomach is within normal limits. Appendix appears normal. No evidence of bowel wall thickening, distention, or inflammatory changes. Vascular/Lymphatic: No significant vascular findings are present. No enlarged abdominal or pelvic lymph nodes. Reproductive:  Prostate is unremarkable. Other: No abdominal wall hernia or abnormality. No abdominopelvic ascites. Musculoskeletal: Interval approximately 20% superior endplate compression deformity of the L3 vertebral body with Schmorl's node formation and sclerosis. No acute fracture lines or bony retropulsion or seen. Minimal lower thoracic and lower lumbar spine degenerative changes. IMPRESSION: 1. Interval probable small cortical scar in the lower pole of the left kidney. A small focus of pyelonephritis is less likely. This also does not have the typical appearance of a mass, especially in the axial plane, but this is not totally excluded. Therefore, a follow up abdomen CT or MRI without and with contrast is recommended in 6 months. 2. Cholecystostomy tube with a collapsed gallbladder. Electronically Signed   By: Beckie Salts M.D.   On: 10/29/2018 16:29   Dg Chest Portable 1 View  Result Date: 10/29/2018 CLINICAL DATA:  Shortness of breath. EXAM: PORTABLE CHEST 1 VIEW COMPARISON:  Chest x-ray dated January 17, 2018. FINDINGS: Heart remains at the upper limits of normal in size status post CABG. Interval placement of a loop recorder. Normal pulmonary vascularity. No focal consolidation, pleural effusion, or pneumothorax. No acute osseous abnormality. IMPRESSION: No active disease. Electronically Signed   By: Obie Dredge M.D.   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 Andrey Campanile  Plan - GI consult pending, may need MRCP vs ERCP. Keep NPO for now and continue broad spectrum abx. Add phenergan for breakthrough nausea. Trend CMP.   LOS: 1 day    Franne Forts , [facility] 10/30/2018, 9:40 AM Pager: [phone] Mon 7:00 am -11:30 AM Tues-Fri 7:00 am-4:30 pm Sat-Sun 7:00 am-11:30 am

## 2018-10-30 NOTE — Discharge Instructions (Signed)
1. Ct Abdomen on 11/17 showed "probable small cortical scar in the lower pole of the left kidney. A small focus of pyelonephritis is less likely. This also does not have the typical appearance of a mass, especially in the axial plane, but this is not totally excluded. A follow up abdomen CT or MRI without and with contrast is recommended in 6 months. Please follow up with your doctor regarding these findings once out of the hospital! 2.

## 2018-10-30 NOTE — Progress Notes (Signed)
Family Medicine Teaching Service Daily Progress Note Intern Pager: [phone]  Patient name: Travis [name] Medical record number: [id number] Date of birth: 08/04/1971 Age: 47 y.o. Gender: male  Primary Care Provider: Wendee BeaversMcMullen, David J, DO Consultants: Gen Surg, IR Code Status: DNI, confirmed with patient   Chief Complaint: vomiting, sweats  Assessment and Plan: Travis [name] is a 47 y.o. male presenting with vomiting, sweating, abd pain. PMH is significant for T2DM, esophageal reflux,HLD,CADs/p CABGin December19,2018, CHF, and diabetic neuropathy.  Sepsis: question gallbladder pathology; CT abd pel with decompressed gallbladder w/o surrounding signs of infection. Surgery following for cholecystostomy tube. Gastroparesis contributing to vomiting? Elevated lactic Acid decreased to 1.6 on 11/18. Bili elevated at 5.7, decreased 3.1 11/18, AST mildly elevated.  -Ice chips now, otherwise NPO -IR Consulted - "injected cholecystostomy which showed patent cystic duct and common bile duct, with contrast draining into the duodenum w/o obstruction. Recommend keeping drain in place to gravity." -Continue Zosyn per pharm -Gen Surg Consulted - recommend GI consult for MRCP/ERCP. -Follow up blood cultures  T2DM, stable: with neuropathy in bilateral upper and lower extremities. Last A1c 8.0 on 10/23/18. PCP recently restarted metformin, although family thinks this is unrelated to present sxs.  -sSSI -Hold metformin  -Lantus 10U qAM  GERD: on PPI at home -Hold while NPO  HLD: at home on crestor, long term statin indication is CAD + CVA.  - resume once taking PO  CAD s/p CABG: CABG 12/18, no sxs today. - hold ASA and plavix pending surg recs  H/o multiple CVA: recent CVA for which he received TPA on 06/25/18. Family reports surgery is waiting to intervene on gallbladder until neuro clears. Had appt this week. - hold ASA and plavix pending surg recs  HFrEF: last echo 7/19 with EF  35-40%. ?hypovolemia with lactic acidosis, hold home diuresis.  -hold home lasix  HTN: hold ARB with sepsis and lactic acidosis. No hypotension despite sepsis, will continue lower dose BB -metoprolol 12.5 mg BID  ?Hx of anemia: on iron at home, hold while acute infection.  -hold iron  Major depressive disorder:  No meds at present.   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   Incidental kidney finding: see CT abd pelv from 10/29/18. "Interval probable small cortical scar in the lower pole of the left kidney. A small focus of pyelonephritis is less likely. This also does not have the typical appearance of a mass, especially in the axial plane, but this is not totally excluded. Therefore, a follow up abdomen CT or MRI without and with contrast is recommended in 6 months." -f/u CT or MRI w wo in 6 months  FEN/GI: ice chips now Prophylaxis: hep subq  Disposition: admit to stepdown   Subjective:  Patient seen this morning resting in bed in moderate he, occasionally dry heaving.  States he overall feels better in his abdomen but the nausea is terrible.  He reports he has not passed gas or had a bowel movement in about a day.  Otherwise, he has no new complaints.   Objective: Temp:  [97.3 F (36.3 C)-99.6 F (37.6 C)] 99.2 F (37.3 C) (11/17 2215) Pulse Rate:  [80-95] 81 (11/17 2215) Resp:  [15-31] 20 (11/18 0127) BP: (120-153)/(61-89) 147/83 (11/17 2215) SpO2:  [93 %-100 %] 99 % (11/17 2215) Weight:  [99 kg-99.8 kg] 99 kg (11/18 0035)  Physical Exam  Constitutional: He is oriented to person, place, and time. He appears well-developed. He  appears ill.  Cardiovascular: Normal rate, regular rhythm and intact distal pulses.  Pulmonary/Chest: Effort normal and breath sounds normal.  Abdominal: Bowel sounds are absent.  Distended, tenderness to palpation, no rebound.  Eulah Pont sign was not tested due to findings on imaging.  Cholecystostomy is in place  draining bilious fluid.  Neurological: He is alert and oriented to person, place, and time.  Skin: Capillary refill takes less than 2 seconds.   Laboratory: Recent Labs  Lab 10/29/18 1353 10/30/18 0319  WBC 14.4* 12.2*  HGB 14.0 11.3*  HCT 42.8 33.9*  PLT 337 276   Recent Labs  Lab 10/29/18 1353 10/30/18 0319  NA 132* 136  K 5.0 3.3*  CL 99 104  CO2 16* 23  BUN 19 18  CREATININE 1.20 1.30*  CALCIUM 9.6 8.5*  PROT 7.9 6.7  BILITOT 5.7* 3.1*  ALKPHOS 83 64  ALT 28 25  AST 42* 21  GLUCOSE 251* 214*   Urinalysis    Component Value Date/Time   COLORURINE YELLOW 10/29/2018 1859   APPEARANCEUR CLEAR 10/29/2018 1859   LABSPEC >1.046 (H) 10/29/2018 1859   PHURINE 5.0 10/29/2018 1859   GLUCOSEU >=500 (A) 10/29/2018 1859   HGBUR MODERATE (A) 10/29/2018 1859   HGBUR negative 09/17/2009 0834   BILIRUBINUR NEGATIVE 10/29/2018 1859   KETONESUR 80 (A) 10/29/2018 1859   PROTEINUR 100 (A) 10/29/2018 1859   UROBILINOGEN 1.0 04/30/2016 1338   NITRITE NEGATIVE 10/29/2018 1859   LEUKOCYTESUR NEGATIVE 10/29/2018 1859   INR: 1.21 LA: 4.58 > 3.24 > 1.7 > 1.9 FOBT: negative Lipase: 25 HbA1c: 6.9 > 8.0  Imaging/Diagnostic Tests: Ct Abdomen Pelvis W Contrast  Result Date: 10/29/2018 CLINICAL DATA:  Nausea and vomiting for several days. EXAM: CT ABDOMEN AND PELVIS WITH CONTRAST TECHNIQUE: Multidetector CT imaging of the abdomen and pelvis was performed using the standard protocol following bolus administration of intravenous contrast. CONTRAST:  OMNIPAQUE IOHEXOL 300 MG/ML  SOLN COMPARISON:  Right upper quadrant abdomen ultrasound dated 08/11/2018. Abdomen and pelvis CT dated 04/10/2017. FINDINGS: Lower chest: Right lower lobe calcified granuloma. Hepatobiliary: Cholecystostomy tube with a collapsed gallbladder. Unremarkable liver. Pancreas: Unremarkable. No pancreatic ductal dilatation or surrounding inflammatory changes. Spleen: Normal in size without focal abnormality.  Adrenals/Urinary Tract: Adrenal glands are unremarkable. Interval small, heterogeneous area of decreased density in the lower pole of the left kidney, measuring 1.5 x 1.1 cm on coronal image number 63 series 6. This has an appearance most compatible with interval cortical scar in the axial plane on images 28 series of 8 and 50 of series 3. Otherwise, the kidneys are normal, without renal calculi, focal lesion, or hydronephrosis. Bladder is unremarkable. Stomach/Bowel: Stomach is within normal limits. Appendix appears normal. No evidence of bowel wall thickening, distention, or inflammatory changes. Vascular/Lymphatic: No significant vascular findings are present. No enlarged abdominal or pelvic lymph nodes. Reproductive: Prostate is unremarkable. Other: No abdominal wall hernia or abnormality. No abdominopelvic ascites. Musculoskeletal: Interval approximately 20% superior endplate compression deformity of the L3 vertebral body with Schmorl's node formation and sclerosis. No acute fracture lines or bony retropulsion or seen. Minimal lower thoracic and lower lumbar spine degenerative changes. IMPRESSION: 1. Interval probable small cortical scar in the lower pole of the left kidney. A small focus of pyelonephritis is less likely. This also does not have the typical appearance of a mass, especially in the axial plane, but this is not totally excluded. Therefore, a follow up abdomen CT or MRI without and with contrast is recommended in  6 months. 2. Cholecystostomy tube with a collapsed gallbladder. Electronically Signed   By: Beckie Salts M.D.   On: 10/29/2018 16:29   Dg Chest Portable 1 View  Result Date: 10/29/2018 CLINICAL DATA:  Shortness of breath. EXAM: PORTABLE CHEST 1 VIEW COMPARISON:  Chest x-ray dated January 17, 2018. FINDINGS: Heart remains at the upper limits of normal in size status post CABG. Interval placement of a loop recorder. Normal pulmonary vascularity. No focal consolidation, pleural effusion,  or pneumothorax. No acute osseous abnormality. IMPRESSION: No active disease. Electronically Signed   By: Obie Dredge M.D.   On: 10/29/2018 16:28   Ir Cholangiogram Existing Tube  Result Date: 10/17/2018 INDICATION: Cholecystostomy tube check. His tube has been draining to the gravity bag since placement. After tube placement, the patient's symptoms have resolved. EXAM: CHOLANGIOGRAM MEDICATIONS: None ANESTHESIA/SEDATION: None FLUOROSCOPY TIME:  Fluoroscopy Time:  minutes 36 seconds (10 mGy). COMPLICATIONS: None immediate. PROCEDURE: Informed written consent was obtained from the patient after a thorough discussion of the procedural risks, benefits and alternatives. All questions were addressed. Maximal Sterile Barrier Technique was utilized including caps, mask, sterile gowns, sterile gloves, sterile drape, hand hygiene and skin antiseptic. A timeout was performed prior to the initiation of the procedure. Contrast was injected and imaging was obtained. FINDINGS: The cholecystostomy tube is appropriately positioned. The cystic duct is patent. IMPRESSION: Cholangiogram demonstrates patency of the cystic duct and appropriate position of the cholecystostomy tube. The patient's symptoms resolved after cholecystostomy tube gravity bag drainage. The patient is to follow-up with surgery in 2 weeks for intended cholecystectomy. Electronically Signed   By: Jolaine Click M.D.   On: 10/17/2018 14:28     Dollene Cleveland, DO 10/30/2018, 7:57 AM PGY-1,  Family Medicine FPTS Intern pager: [phone], text pages welcome

## 2018-10-30 NOTE — Procedures (Signed)
Cholecystitis, s/p cholecystostomy  Injection of drain shows patency of the cytic duct and the CBD.  Contrast drains into the duodenum.  No obstruction  Keep to gravity drain for now per surgery  No new recs

## 2018-10-30 NOTE — Discharge Summary (Addendum)
[facility] Discharge Summary  Patient name: Travis [name] Medical record number: [id number] Date of birth: February 05, 1971 Age: 47 y.o. 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  Source of infectious etiology on admission was thought to be the patient's gallbladder.  CT scan of the abdomen revealed cholecystostomy tube in place with appropriately collapsed gallbladder. Blood cultures were drawn and the patient was started on Zosyn. General surgery was consulted in the ED regarding the patient's cholecystostomy, who recommended consulting GI.  Interventional radiology performed a cholangiogram using the patient's cholecystostomy, and cystic and common bile ducts were deemed patent.   The patient's fevers resolved, his vitals stabilized, and his white cell count returned to normal, but the patient continued having nausea and vomiting potentially due to gastroparesis. The patient's bowel function slowly returned to normal and and his nausea and vomiting decreased significantly. He was discharged home with close outpatient follow up on 11/02/2018.  Of note, the CT  scan revealed a cortical lesion in the lower pole left kidney, which needs to be followed up with a CT or MRI 6 months from hospitalization (May 2020).  Issues for Follow Up:  Ct Abdomen on 11/17 showed "probable small cortical scar in the lower pole of the left kidney. A small focus of pyelonephritis is less likely. This also does not have the typical appearance of a mass, especially in the axial plane, but this is not totally excluded. A follow up abdomen CT or MRI without and with contrast is recommended in 6 months. Patient was notified and instructed to follow up. Check Fractionated Bilirubin with Hep B and C testing at follow up.  Significant Procedures:  Cholangiogram - 10/30/2018  Significant Labs and Imaging:  Recent Labs  Lab 10/31/18 0321 11/01/18 0249 11/02/18 0330  WBC 8.8 8.6 9.0  HGB 11.6* 12.0* 12.7*  HCT 34.7* 34.7* 37.6*  PLT 254 254 281   Recent Labs  Lab 10/29/18 1353 10/30/18 0319 10/31/18 0321 11/01/18 0249 11/02/18 0330  NA 132* 136 134* 131* 132*  K 5.0 3.3* 3.0* 2.9* 3.5  CL 99 104 103 100 102  CO2 16* 23 21* 23 21*  GLUCOSE 251* 214* 132* 101* 111*  BUN '19 18 13 12 13  ' CREATININE 1.20 1.30* 0.93 0.99 1.02  CALCIUM 9.6 8.5* 8.4* 8.4* 8.6*  MG  --   --   --  2.0  --   ALKPHOS 83 64 56 55 67  AST 42* '21 25 26 29  ' ALT '28 25 25 26 31  ' ALBUMIN 4.3 3.5 3.2* 3.2* 3.3*   Urinalysis    Component Value Date/Time   COLORURINE YELLOW 10/29/2018 1859   APPEARANCEUR CLEAR 10/29/2018 1859   LABSPEC >1.046 (H) 10/29/2018 1859   PHURINE  5.0 10/29/2018 1859   GLUCOSEU >=500 (A) 10/29/2018 1859   HGBUR MODERATE (A) 10/29/2018 1859   HGBUR negative 09/17/2009 0834   BILIRUBINUR NEGATIVE 10/29/2018 1859   KETONESUR 80 (A) 10/29/2018 1859   PROTEINUR 100 (A) 10/29/2018 1859   UROBILINOGEN 1.0 04/30/2016 1338   NITRITE NEGATIVE 10/29/2018 1859   LEUKOCYTESUR NEGATIVE 10/29/2018 1859   INR: 1.21 LA: 4.58 > 3.24 > 1.7 > 1.9 FOBT: negative Lipase:  25 HbA1c: 6.9 > 8.0%  Results/Tests Pending at Time of Discharge: None  Discharge Medications:  Allergies as of 11/02/2018   No Known Allergies     Medication List    STOP taking these medications   metoCLOPramide 10 MG tablet Commonly known as:  REGLAN     TAKE these medications   aspirin 325 MG EC tablet Take 1 tablet (325 mg total) by mouth daily.   clopidogrel 75 MG tablet Commonly known as:  PLAVIX Take 1 tablet (75 mg total) by mouth daily.   erythromycin 250 MG tablet Commonly known as:  E-MYCIN Take 1 tablet (250 mg total) by mouth 3 (three) times daily before meals.   furosemide 20 MG tablet Commonly known as:  LASIX Take 1 tablet (20 mg total) by mouth daily.   gabapentin 800 MG tablet Commonly known as:  NEURONTIN TAKE 1 TABLET(800 MG) BY MOUTH THREE TIMES DAILY What changed:  See the new instructions.   glucose blood test strip 1 each by Other route as needed for other. Use as instructed   insulin detemir 100 UNIT/ML injection Commonly known as:  LEVEMIR Inject 0.2 mLs (20 Units total) into the skin at bedtime. Take 1/2 of normal dose tonight 06/27/18 for planned procedure tomorrow. Then resume normal dose What changed:  additional instructions   Iron 325 (65 Fe) MG Tabs Take 1 tablet (325 mg total) by mouth 2 (two) times daily after a meal.   losartan 25 MG tablet Commonly known as:  COZAAR Take 12.5 mg by mouth daily.   metFORMIN 750 MG 24 hr tablet Commonly known as:  GLUCOPHAGE-XR TAKE 1 TAB DAILY FOR 1 WEEK THEN INCREASE TO 2 TABS DAILY   metoprolol succinate 25 MG 24 hr tablet Commonly known as:  TOPROL-XL Take 0.5 tablets (12.5 mg total) by mouth daily.   ondansetron 4 MG/2ML Soln injection Commonly known as:  ZOFRAN Inject 2 mLs (4 mg total) into the vein every 6 (six) hours as needed for nausea.   ONETOUCH DELICA LANCETS 26V Misc 1 application by Does not apply route 4 (four) times daily.   ONETOUCH VERIO w/Device Kit 1  application by Does not apply route 4 (four) times daily.   pantoprazole 40 MG tablet Commonly known as:  PROTONIX Take 1 tablet (40 mg total) by mouth daily.   rosuvastatin 40 MG tablet Commonly known as:  CRESTOR Take 1 tablet (40 mg total) by mouth daily.      Discharge Instructions: Please refer to Patient Instructions section of EMR for full details.  Patient was counseled important signs and symptoms that should prompt return to medical care, changes in medications, dietary instructions, activity restrictions, and follow up appointments.    Follow-Up Appointments: Follow-up Information    Alveda Reasons, MD. Go on 11/21/2018.   Specialty:  Family Medicine Why:  10am hospital follow up  Contact information: [place] [address] [phone]         11/07/2018 - with Cardiology 12/26/2018 - with Cardiology  Milus Banister  C, DO 11/03/2018, 7:36 PM PGY-1, [place]

## 2018-10-31 LAB — COMPREHENSIVE METABOLIC PANEL
ALT: 25 U/L (ref 0–44)
ANION GAP: 10 (ref 5–15)
AST: 25 U/L (ref 15–41)
Albumin: 3.2 g/dL — ABNORMAL LOW (ref 3.5–5.0)
Alkaline Phosphatase: 56 U/L (ref 38–126)
BILIRUBIN TOTAL: 2.2 mg/dL — AB (ref 0.3–1.2)
BUN: 13 mg/dL (ref 6–20)
CO2: 21 mmol/L — ABNORMAL LOW (ref 22–32)
Calcium: 8.4 mg/dL — ABNORMAL LOW (ref 8.9–10.3)
Chloride: 103 mmol/L (ref 98–111)
Creatinine, Ser: 0.93 mg/dL (ref 0.61–1.24)
GFR calc Af Amer: >60 mL/min (ref >60)
Glucose, Bld: 132 mg/dL — ABNORMAL HIGH (ref 70–99)
POTASSIUM: 3 mmol/L — AB (ref 3.5–5.1)
Sodium: 134 mmol/L — ABNORMAL LOW (ref 135–145)
TOTAL PROTEIN: 6.2 g/dL — AB (ref 6.5–8.1)

## 2018-10-31 LAB — GLUCOSE, CAPILLARY
GLUCOSE-CAPILLARY: 116 mg/dL — AB (ref 70–99)
GLUCOSE-CAPILLARY: 122 mg/dL — AB (ref 70–99)
GLUCOSE-CAPILLARY: 120 mg/dL — AB (ref 70–99)
Glucose-Capillary: 110 mg/dL — ABNORMAL HIGH (ref 70–99)

## 2018-10-31 LAB — CBC
HEMATOCRIT: 34.7 % — AB (ref 39.0–52.0)
Hemoglobin: 11.6 g/dL — ABNORMAL LOW (ref 13.0–17.0)
MCH: 28.8 pg (ref 26.0–34.0)
MCHC: 33.4 g/dL (ref 30.0–36.0)
MCV: 86.1 fL (ref 80.0–100.0)
Platelets: 254 10*3/uL (ref 150–400)
RBC: 4.03 MIL/uL — ABNORMAL LOW (ref 4.22–5.81)
RDW: 12.6 % (ref 11.5–15.5)
WBC: 8.8 10*3/uL (ref 4.0–10.5)
nRBC: 0 % (ref 0.0–0.2)

## 2018-10-31 MED ORDER — ROSUVASTATIN CALCIUM 10 MG PO TABS
40.0000 mg | ORAL_TABLET | Freq: Every day | ORAL | Status: DC
  Administered 2018-10-31 – 2018-11-01 (×2): 40 mg via ORAL
  Filled 2018-10-31 (×2): qty 4

## 2018-10-31 MED ORDER — PANTOPRAZOLE SODIUM 40 MG PO TBEC
40.0000 mg | DELAYED_RELEASE_TABLET | Freq: Every day | ORAL | Status: DC
  Administered 2018-10-31 – 2018-11-01 (×2): 40 mg via ORAL
  Filled 2018-10-31 (×2): qty 1

## 2018-10-31 MED ORDER — POTASSIUM CHLORIDE CRYS ER 20 MEQ PO TBCR
40.0000 meq | EXTENDED_RELEASE_TABLET | Freq: Two times a day (BID) | ORAL | Status: DC
  Administered 2018-10-31: 40 meq via ORAL
  Filled 2018-10-31 (×3): qty 2

## 2018-10-31 MED ORDER — LORAZEPAM 2 MG/ML IJ SOLN
0.5000 mg | Freq: Once | INTRAMUSCULAR | Status: AC
  Administered 2018-10-31: 0.5 mg via INTRAVENOUS
  Filled 2018-10-31: qty 1

## 2018-10-31 NOTE — Progress Notes (Signed)
Family Medicine Teaching Service Daily Progress Note Intern Pager: [phone]  Patient name: Travis [name] Medical record number: [id number] Date of birth: 04-24-1971 Age: 47 y.o. Gender: male  Primary Care Provider: Wendee Beavers, DO Consultants: General Surgery, IR Code Status: DNI  Pt Overview and Major Events to Date:  9/17-admitted to stepdown  Assessment and Plan: E. coli is a 47 year old male presenting with vomiting, sweating, and abdominal pain.  Past medical history significant for type 2 diabetes, GERD, hyperlipidemia, coronary artery disease status post CABG in December 2018, CHF, and diabetic neuropathy.  Sepsis: Question gallbladder pathology.  CT abdomen shows decompressed gallbladder without surrounding signs of infection.  Patient with cholecystostomy tube in place since 8 weeks, surgery following for tube.  Cholangiogram 10/18 shows patent cystic and common bile ducts. Gastroparesis contributing to vomiting. T-max overnight 99.3 F, blood pressure 137/68 this a.m. 11/19.  Heart rate 62.  White blood cell count improved from 12.2 > 8.8 11/19. -Full liquid diet as tolerated -IR consulted - cholangiogram 11/18 shows patent cystic and common bile ducts -Surgery consulted - "recommend MRCP/ERCP with GI. Signed off. Recommend patient follows up with Dr. Andrey Campanile. Leave Perc Chole tube in place." -Continue IV Zosyn -Follow-up blood cultures - so far no growth at 24 hours, may be due to inadequate amount of blood -Pain management with morphine -Zofran 4 or 8 mg q6hrs PRN nausea, vomiting  T2DM, chronic, stable: CBG this a.m. 116 11/19.  Patient with neuropathy in bilateral upper and lower extremities.  A1c 8.0% in November 2018.  On home metformin -sSSI -Metformin -Lantus 10 units every morning  Hypokalemia: 5.0 > 3.3 > 3.0 on 11/19.  -Repeat BMP 11/20 -Replete with KDur BID  Gastroparesis: Patient reports he has not had a bowel movement since Friday 11/15 (about 4  days) and denies passing gas in about 24 to 48 hours.  Bowel sounds on physical exam.  CT abdomen 11/17 was without evidence of bowel wall thickening, distention, abdominal wall hernia, ascites, or inflammatory changes. -Glycerin suppository -Follow-up bowel movements  HLD: At home on Crestor, for CAD and CVA -Restart rosuvastatin 40 mg daily as tolerated  CAD s/p CABG, chronic, stable: CABG in 11/2017, asymptomatic -Continue Plavix 75 mg daily and aspirin 325 mg daily  H/o Multiple CVA: Recent CVA in July 2019 for which she received TPA.  Family report surgery is waiting to intervene on gallbladder until neurology clears.  Had appointment this week. -Continue aspirin and Plavix  HFrEF: Last echo 7/19 with EF 35 to 40%.  Questionable hypovolemia with lactic acidosis on admission, since resolved.  -Hold home Lasix  HTN: BP 137/68 on 11/19. No hypotension despite sepsis.  -Hold Losartan -Continue low-dose metoprolol 12.5 mg BID  ?Hx of Anemia, stable: on iron at home.  Hemoglobin 11.3 > 11.6 on 11/19. - Hold iron with acute infection  Incidental Kidney Finding: CT abdomen pelvis from 10/29/2018, "interval probable small cortical scar in the lower pole the left kidney.  A small focus of pyelonephritis is less likely.  This also does not have the typical appearance of a mass, especially in the axial plane, but this is not totally excluded.  Therefore, follow-up abdomen CT or MRI with out and with contrast is recommended in 6 months." -Follow-up CT or MRI with and without contrast in 6 months -Patient given verbal notice of findings instructions for follow-up -Findings and instructions for follow-up placed in patient discharge instructions and summary. -Findings added to problem list  FEN/GI: saline  lock, full liquid diet, Protonix, suppository PPx: Heparin  Disposition: Moved from stepdown to med-surg on 11/19.  Subjective:  The patient reports having slightly improved abdominal pain,  but continued nausea with dry heaving.  He has tried eat a couple bites of food which oftentimes comes back up.  He is hesitant to try Reglan for nausea as it might cause him to have diarrhea.  He states he has been passing gas in the last 24 hours but denies a bowel movement since Friday (5 days ago).  Objective: Temp:  [97.4 F (36.3 C)-99.4 F (37.4 C)] 98.3 F (36.8 C) (11/19 0442) Pulse Rate:  [62-78] 62 (11/19 0442) Resp:  [14-18] 14 (11/19 0442) BP: (119-159)/(68-83) 137/68 (11/19 0442) SpO2:  [97 %-100 %] 100 % (11/19 0442)  Physical Exam  Constitutional: He is oriented to person, place, and time. He appears well-developed. He appears ill.  Cardiovascular: Normal rate, regular rhythm and intact distal pulses.  Pulmonary/Chest: Effort normal and breath sounds normal.  Abdominal:  Distended, tenderness to palpation, no rebound.  Eulah PontMurphy sign was not tested due to findings on imaging.  Cholecystostomy is in place draining bilious fluid. Scant bowel sounds in right abdomen. Actively dry heaving on exam.  Neurological: He is alert and oriented to person, place, and time.  Skin: Capillary refill takes less than 2 seconds.   Laboratory: Recent Labs  Lab 10/29/18 1353 10/30/18 0319 10/31/18 0321  WBC 14.4* 12.2* 8.8  HGB 14.0 11.3* 11.6*  HCT 42.8 33.9* 34.7*  PLT 337 276 254   Recent Labs  Lab 10/29/18 1353 10/30/18 0319 10/31/18 0321  NA 132* 136 134*  K 5.0 3.3* 3.0*  CL 99 104 103  CO2 16* 23 21*  BUN 19 18 13   CREATININE 1.20 1.30* 0.93  CALCIUM 9.6 8.5* 8.4*  PROT 7.9 6.7 6.2*  BILITOT 5.7* 3.1* 2.2*  ALKPHOS 83 64 56  ALT 28 25 25   AST 42* 21 25  GLUCOSE 251* 214* 132*   Imaging/Diagnostic Tests: Ct Abdomen Pelvis W Contrast 10/29/2018:  1. Interval probable small cortical scar in the lower pole of the left kidney. A small focus of pyelonephritis is less likely. This also does not have the typical appearance of a mass, especially in the axial plane, but this  is not totally excluded. Therefore, a follow up abdomen CT or MRI without and with contrast is recommended in 6 months.  2. Cholecystostomy tube with a collapsed gallbladder.   CXR 10/29/2018: Heart remains at the upper limits of normal in size status post CABG. Interval placement of a loop recorder. Normal pulmonary vascularity. No focal consolidation, pleural effusion, or pneumothorax. No acute osseous abnormality. IMPRESSION: No active disease.   Ir Cholangiogram Check 11/18: With moderate gallbladder distension contrast drains through the patent cystic duct. With further injection, the visualized intrahepatic ducts, biliary confluence, common hepatic duct, common bile duct are all patent. Contrast does drain into the duodenum without obstruction, stricture, or filling defect. Patent biliary system.    Dollene ClevelandAnderson, Hannah C, DO 10/31/2018, 8:07 AM PGY-1, [facility] FPTS Intern pager: [phone], text pages welcome

## 2018-11-01 LAB — COMPREHENSIVE METABOLIC PANEL
ALT: 26 U/L (ref 0–44)
ANION GAP: 8 (ref 5–15)
AST: 26 U/L (ref 15–41)
Albumin: 3.2 g/dL — ABNORMAL LOW (ref 3.5–5.0)
Alkaline Phosphatase: 55 U/L (ref 38–126)
BILIRUBIN TOTAL: 2.4 mg/dL — AB (ref 0.3–1.2)
BUN: 12 mg/dL (ref 6–20)
CO2: 23 mmol/L (ref 22–32)
Calcium: 8.4 mg/dL — ABNORMAL LOW (ref 8.9–10.3)
Chloride: 100 mmol/L (ref 98–111)
Creatinine, Ser: 0.99 mg/dL (ref 0.61–1.24)
GFR calc Af Amer: >60 mL/min (ref >60)
GFR calc non Af Amer: >60 mL/min (ref >60)
Glucose, Bld: 101 mg/dL — ABNORMAL HIGH (ref 70–99)
POTASSIUM: 2.9 mmol/L — AB (ref 3.5–5.1)
Sodium: 131 mmol/L — ABNORMAL LOW (ref 135–145)
TOTAL PROTEIN: 6.1 g/dL — AB (ref 6.5–8.1)

## 2018-11-01 LAB — CBC
HEMATOCRIT: 34.7 % — AB (ref 39.0–52.0)
Hemoglobin: 12 g/dL — ABNORMAL LOW (ref 13.0–17.0)
MCH: 29 pg (ref 26.0–34.0)
MCHC: 34.6 g/dL (ref 30.0–36.0)
MCV: 83.8 fL (ref 80.0–100.0)
Platelets: 254 10*3/uL (ref 150–400)
RBC: 4.14 MIL/uL — ABNORMAL LOW (ref 4.22–5.81)
RDW: 12.3 % (ref 11.5–15.5)
WBC: 8.6 10*3/uL (ref 4.0–10.5)
nRBC: 0 % (ref 0.0–0.2)

## 2018-11-01 LAB — GLUCOSE, CAPILLARY
Glucose-Capillary: 96 mg/dL (ref 70–99)
Glucose-Capillary: 98 mg/dL (ref 70–99)
Glucose-Capillary: 131 mg/dL — ABNORMAL HIGH (ref 70–99)
Glucose-Capillary: 113 mg/dL — ABNORMAL HIGH (ref 70–99)

## 2018-11-01 LAB — MAGNESIUM: Magnesium: 2 mg/dL (ref 1.7–2.4)

## 2018-11-01 MED ORDER — POTASSIUM CHLORIDE 10 MEQ/100ML IV SOLN
10.0000 meq | INTRAVENOUS | Status: AC
  Administered 2018-11-01 (×4): 10 meq via INTRAVENOUS
  Filled 2018-11-01: qty 100

## 2018-11-01 MED ORDER — ERYTHROMYCIN BASE 250 MG PO TABS
250.0000 mg | ORAL_TABLET | Freq: Three times a day (TID) | ORAL | Status: DC
  Administered 2018-11-01 – 2018-11-02 (×3): 250 mg via ORAL
  Filled 2018-11-01 (×5): qty 1

## 2018-11-01 MED ORDER — GLYCERIN (LAXATIVE) 2.1 G RE SUPP
1.0000 | Freq: Every day | RECTAL | Status: DC | PRN
  Filled 2018-11-01: qty 1

## 2018-11-01 NOTE — Care Management Important Message (Signed)
Important Message  Patient Details  Name: Travis [name] MRN: [id number] Date of Birth: 01/24/1971   Medicare Important Message Given:  Yes    Lynne Takemoto 11/01/2018, 3:09 PM

## 2018-11-01 NOTE — Progress Notes (Signed)
Family Medicine Teaching Service Daily Progress Note Intern Pager: [phone]  Patient name: Travis [name] Medical record number: [id number] Date of birth: 10-10-1971 Age: 47 y.o. Gender: male  Primary Care Provider: Wendee Beavers, DO Consultants: General Surgery, IR Code Status: DNI  Pt Overview and Major Events to Date:  9/17-admitted to stepdown  Assessment and Plan: E. coli is a 47 year old male presenting with vomiting, sweating, and abdominal pain.  Past medical history significant for type 2 diabetes, GERD, hyperlipidemia, coronary artery disease status post CABG in December 2018, CHF, and diabetic neuropathy.  Sepsis: Question gallbladder pathology. CT abdomen shows decompressed gallbladder w/o surrounding signs of infection. Cholangiogram 11/18 shows patent cystic and common bile ducts. Gastroparesis contributing to vomiting. T-max overnight 98.8 F, blood pressure 128/58  11/19. Heart rate 59. WBC count improved from 12.2 > 8.6 11/20. -Full liquid diet as tolerated -IR consulted -  -Surgery consulted - signed off -Discontinue IV Zosyn 11/20 - will monitor patient status for 24 hours -Follow-up blood cultures - no growth at 2 days may be due to inadequate volume -Pain management with morphine -Zofran 4 or 8 mg q6hrs PRN nausea, vomiting  T2DM, chronic, stable: CBG 101 11/19. Patient with neuropathy in bilateral upper and lower extremities. A1c 8.0% in November 2018. On home metformin -sSSI -Metformin -Lantus 10 units every morning  Hypokalemia: 5.0 > 3.3 > 3.0 > 2.9 on 11/19. Ordered Kdur 40 BID yesterday, patient took one, refused the other. Mg 2.0. -Repeat BMP 11/21 -Replete with IV KCl BID  Gastroparesis: Patient reports he has not had a bowel movement since Friday 11/15 (about 4 days) and denies passing gas in about 24 to 48 hours.  Bowel sounds on physical exam.  CT abdomen 11/17 was without evidence of bowel wall thickening, distention, abdominal wall  hernia, ascites, or inflammatory changes. -Glycerin suppository  -Follow-up bowel movements/nausea/vomiting -Erythromycin 250mg  TID  HTN: BP 128/58 on 11/20. No hypotension despite sepsis, HR 59 -Hold Losartan -Continue low-dose metoprolol 12.5 mg BID  ?Hx of Anemia, stable: on iron at home.  Hemoglobin 11.3 > 12 on 11/20. - Hold iron with acute infection  CHRONIC ISSUES: HLD: At home on Crestor, for CAD and CVA -Restart rosuvastatin 40 mg daily as tolerated  CAD s/p CABG, chronic, stable: CABG in 11/2017, asymptomatic -Continue Plavix 75 mg daily and aspirin 325 mg daily  H/o Multiple CVA: Recent CVA in July 2019 for which she received TPA.  Family report surgery is waiting to intervene on gallbladder until neurology clears.  Had appointment this week. -Continue aspirin and Plavix  HFrEF: Last echo 7/19 with EF 35 to 40%.  Questionable hypovolemia with lactic acidosis on admission, since resolved.  -Hold home Lasix  Incidental Kidney Finding: CT abdomen pelvis from 10/29/2018, "interval probable small cortical scar in the lower pole the left kidney.  A small focus of pyelonephritis is less likely.  This also does not have the typical appearance of a mass, especially in the axial plane, but this is not totally excluded.  Therefore, follow-up abdomen CT or MRI with out and with contrast is recommended in 6 months." -Follow-up CT or MRI with and without contrast in 6 months -Patient given verbal notice of findings instructions for follow-up -Findings and instructions for follow-up placed in patient discharge instructions and summary. -Findings added to problem list  FEN/GI: saline lock, full liquid diet, Protonix, suppository PPx: Heparin  Disposition: Discharge home once tolerating PO.  Subjective:  The patient states he feels about "  10% better".  He continues to have nausea and vomiting but is improving.  The abdominal pain stays about the same.  He reports he is passing  gas but has yet to have a bowel movement since Friday (day 6).  He has no other concerns at this time.  Objective: Temp:  [98.2 F (36.8 C)-98.8 F (37.1 C)] 98.8 F (37.1 C) (11/20 0404) Pulse Rate:  [59-66] 59 (11/20 0404) Resp:  [15-21] 21 (11/20 0404) BP: (128-150)/(58-79) 128/58 (11/20 0404) SpO2:  [97 %-100 %] 98 % (11/20 0404)  Physical Exam  Constitutional: He is oriented to person, place, and time. He appears well-developed. He appears ill.  Cardiovascular: Normal rate, regular rhythm and intact distal pulses.  Pulmonary/Chest: Effort normal and breath sounds normal.  Abdominal:  Distended, tenderness to palpation, no rebound.  Eulah PontMurphy sign was not tested due to findings on imaging.  Cholecystostomy is in place draining bilious fluid. Scant bowel sounds in right abdomen. Actively dry heaving on exam.  Neurological: He is alert and oriented to person, place, and time.  Skin: Capillary refill takes less than 2 seconds.   Laboratory: Recent Labs  Lab 10/30/18 0319 10/31/18 0321 11/01/18 0249  WBC 12.2* 8.8 8.6  HGB 11.3* 11.6* 12.0*  HCT 33.9* 34.7* 34.7*  PLT 276 254 254   Recent Labs  Lab 10/30/18 0319 10/31/18 0321 11/01/18 0249  NA 136 134* 131*  K 3.3* 3.0* 2.9*  CL 104 103 100  CO2 23 21* 23  BUN 18 13 12   CREATININE 1.30* 0.93 0.99  CALCIUM 8.5* 8.4* 8.4*  PROT 6.7 6.2* 6.1*  BILITOT 3.1* 2.2* 2.4*  ALKPHOS 64 56 55  ALT 25 25 26   AST 21 25 26   GLUCOSE 214* 132* 101*   Imaging/Diagnostic Tests: Ct Abdomen Pelvis W Contrast 10/29/2018:  1. Interval probable small cortical scar in the lower pole of the left kidney. A small focus of pyelonephritis is less likely. This also does not have the typical appearance of a mass, especially in the axial plane, but this is not totally excluded. Therefore, a follow up abdomen CT or MRI without and with contrast is recommended in 6 months.  2. Cholecystostomy tube with a collapsed gallbladder.   CXR 10/29/2018:  Heart remains at the upper limits of normal in size status post CABG. Interval placement of a loop recorder. Normal pulmonary vascularity. No focal consolidation, pleural effusion, or pneumothorax. No acute osseous abnormality. IMPRESSION: No active disease.   Ir Cholangiogram Check 11/18: With moderate gallbladder distension contrast drains through the patent cystic duct. With further injection, the visualized intrahepatic ducts, biliary confluence, common hepatic duct, common bile duct are all patent. Contrast does drain into the duodenum without obstruction, stricture, or filling defect. Patent biliary system.   Dollene ClevelandAnderson, Jeanee Fabre C, DO 11/01/2018, 8:08 AM PGY-1, [place] [facility] [facility] FPTS Intern pager: [phone], text pages welcome

## 2018-11-02 ENCOUNTER — Other Ambulatory Visit: Payer: Self-pay | Admitting: Family Medicine

## 2018-11-02 DIAGNOSIS — E785 Hyperlipidemia, unspecified: Secondary | ICD-10-CM

## 2018-11-02 DIAGNOSIS — Z89512 Acquired absence of left leg below knee: Secondary | ICD-10-CM

## 2018-11-02 DIAGNOSIS — I2510 Atherosclerotic heart disease of native coronary artery without angina pectoris: Secondary | ICD-10-CM

## 2018-11-02 DIAGNOSIS — R9389 Abnormal findings on diagnostic imaging of other specified body structures: Secondary | ICD-10-CM

## 2018-11-02 DIAGNOSIS — K219 Gastro-esophageal reflux disease without esophagitis: Secondary | ICD-10-CM

## 2018-11-02 LAB — COMPREHENSIVE METABOLIC PANEL
ALBUMIN: 3.3 g/dL — AB (ref 3.5–5.0)
ALT: 31 U/L (ref 0–44)
ANION GAP: 9 (ref 5–15)
AST: 29 U/L (ref 15–41)
Alkaline Phosphatase: 67 U/L (ref 38–126)
BUN: 13 mg/dL (ref 6–20)
CHLORIDE: 102 mmol/L (ref 98–111)
CO2: 21 mmol/L — ABNORMAL LOW (ref 22–32)
Calcium: 8.6 mg/dL — ABNORMAL LOW (ref 8.9–10.3)
Creatinine, Ser: 1.02 mg/dL (ref 0.61–1.24)
GFR calc Af Amer: >60 mL/min (ref >60)
Glucose, Bld: 111 mg/dL — ABNORMAL HIGH (ref 70–99)
POTASSIUM: 3.5 mmol/L (ref 3.5–5.1)
Sodium: 132 mmol/L — ABNORMAL LOW (ref 135–145)
Total Bilirubin: 2 mg/dL — ABNORMAL HIGH (ref 0.3–1.2)
Total Protein: 6.3 g/dL — ABNORMAL LOW (ref 6.5–8.1)

## 2018-11-02 LAB — CBC
HCT: 37.6 % — ABNORMAL LOW (ref 39.0–52.0)
Hemoglobin: 12.7 g/dL — ABNORMAL LOW (ref 13.0–17.0)
MCH: 28.6 pg (ref 26.0–34.0)
MCHC: 33.8 g/dL (ref 30.0–36.0)
MCV: 84.7 fL (ref 80.0–100.0)
NRBC: 0 % (ref 0.0–0.2)
PLATELETS: 281 10*3/uL (ref 150–400)
RBC: 4.44 MIL/uL (ref 4.22–5.81)
RDW: 12.3 % (ref 11.5–15.5)
WBC: 9 10*3/uL (ref 4.0–10.5)

## 2018-11-02 LAB — GLUCOSE, CAPILLARY: GLUCOSE-CAPILLARY: 86 mg/dL (ref 70–99)

## 2018-11-02 MED ORDER — ONDANSETRON HCL 4 MG PO TABS: 4.0000 mg | ORAL_TABLET | Freq: Three times a day (TID) | ORAL | 0 refills | Status: DC | PRN

## 2018-11-02 MED ORDER — ERYTHROMYCIN BASE 250 MG PO TABS: 250.0000 mg | ORAL_TABLET | Freq: Three times a day (TID) | ORAL | 0 refills | Status: DC

## 2018-11-02 MED ORDER — ONDANSETRON HCL 4 MG/2ML IJ SOLN: 4.0000 mg | Freq: Four times a day (QID) | INTRAMUSCULAR | 0 refills | Status: DC | PRN

## 2018-11-02 NOTE — Progress Notes (Signed)
Family Medicine Teaching Service Daily Progress Note Intern Pager: [phone]  Patient name: Travis [name] Medical record number: [id number] Date of birth: [birth year] Age: 47 y.o. Gender: male  Primary Care Provider: Wendee Beavers, DO Consultants:General Surgery, IR Code Status:DNI  Pt Overview and Major Events to Date: 9/17-admitted to stepdown  Assessment and Plan:E. coli is a 47 year old male presenting with vomiting, sweating, and abdominal pain. Past medical history significant for type 2 diabetes, GERD, hyperlipidemia, coronary artery disease status post CABG in December 2018, CHF, and diabetic neuropathy.  Nausea/Vomiting 2/2 Gastroparesis, initially thought to be due to sepsis, less likely given evaluation. Patient ate mashed potatoes and egg sandwich yesterday. Patient had bowel movement yesterday.  -Glycerin suppository  -Follow-up bowel movements/nausea/vomiting -Erythromycin 250mg  TID - EKG today   T2DM,chronic, stable:CBG 111 11/21. Patient with neuropathy in bilateral upper and lower extremities.A1c 8.0% in November 2018.On home metformin -sSSI -Metformin -Lantus 10 units every morning  Hypokalemia:5.0 >3.3 >3.0 > 2.9 >3.5 on 11/21. Ordered Kdur 40 BID yesterday, patient took one, refused the other. Mg 2.0. -Repeat BMP 11/21 -Replete with IV KCl BID  HTN: BP 142/85 on 11/21. No hypotension despite sepsis, HR 59 - Restart losartan on discharge, repeat BMET at follow up.  -Continue low-dose metoprolol 12.5 mgBID  History  of Anemia, stable: on iron at home.Hemoglobin 11.3 >12.7 on 11/21. - Hold ironwith acute infection  Hyperbilirubinemia markedly improved, possibly due to vomiting in setting of chronic gallbladder pathology. Repeat at follow up with conjugated bilirubin. Also needs Hep B/C labs at follow up.   CHRONIC ISSUES: HLD:At home on Crestor, for CADandCVA -Restart rosuvastatin 40 mg daily as tolerated  CADs/p CABG,  chronic, stable:CABG in 11/2017, asymptomatic -Continue Plavix 75 mg daily and aspirin 325 mg daily  H/o Multiple EXB:MWUXLK CVA in July 2019 for which she received TPA. Family report surgery is waiting to intervene on gallbladder until neurology clears. Had appointment this week. -Continue aspirin and Plavix  HFrEF:Last echo 7/19 with EF 35 to 40%. Questionable hypovolemia with lactic acidosis on admission, sinceresolved.  - Restart home furosemide.   Incidental Kidney Finding previously addressed with patient.  FEN/GI:saline lock, full liquid diet, Protonix, suppository GMW:NUUVOZD  Disposition:Discharge home today   Subjective:  Patient reports mashed potatoes were a bit too cold yesterday. Ate 1/2 egg sandwich this AM.  Objective: Temp:  [98.6 F (37 C)] 98.6 F (37 C) (11/21 0641) Pulse Rate:  [64] 64 (11/21 0641) Resp:  [19] 19 (11/21 0641) BP: (142)/(85) 142/85 (11/21 0641) SpO2:  [100 %] 100 % (11/21 0641)   Physical Exam  Constitutional: He is oriented to person, place, and time. He appears well-developed.  Cardiovascular: Normal rate, regular rhythm and intact distal pulses.  Pulmonary/Chest: Effort normal and breath sounds normal.  Abdominal:  No tenderness over RUQ.   Neurological: He is alert and oriented to person, place, and time.  Skin: Capillary refill takes less than 2 seconds.   Laboratory: Recent Labs  Lab 10/31/18 0321 11/01/18 0249 11/02/18 0330  WBC 8.8 8.6 9.0  HGB 11.6* 12.0* 12.7*  HCT 34.7* 34.7* 37.6*  PLT 254 254 281   Recent Labs  Lab 10/31/18 0321 11/01/18 0249 11/02/18 0330  NA 134* 131* 132*  K 3.0* 2.9* 3.5  CL 103 100 102  CO2 21* 23 21*  BUN 13 12 13   CREATININE 0.93 0.99 1.02  CALCIUM 8.4* 8.4* 8.6*  PROT 6.2* 6.1* 6.3*  BILITOT 2.2* 2.4* 2.0*  ALKPHOS 56 55 67  ALT 25 26 31   AST 25 26 29   GLUCOSE 132* 101* 111*   Imaging/Diagnostic Tests: Ct Abdomen Pelvis W Contrast11/17/2019:  1.Interval  probable small cortical scar in the lower pole of the left kidney. A small focus of pyelonephritis is less likely. This also does not have the typical appearance of a mass, especially in the axial plane, but this is not totally excluded. Therefore, a follow up abdomen CT or MRI without and with contrast is recommended in 6 months.  2. Cholecystostomy tube with a collapsed gallbladder.  CXR 10/29/2018:Heart remains at the upper limits of normal in size status post CABG. Interval placement of a loop recorder. Normal pulmonary vascularity. No focal consolidation, pleural effusion, or pneumothorax. No acute osseous abnormality. IMPRESSION: No active disease.   Ir CholangiogramCheck 11/18:With moderate gallbladder distension contrast drains through the patent cystic duct. With further injection, the visualized intrahepatic ducts, biliary confluence, common hepatic duct, common bile duct are all patent. Contrast does drain into the duodenum without obstruction, stricture, or filling defect. Patent biliary system.  Peggyann ShoalsHannah Anderson, MD  Travis Starrarina Almyra Birman, MD  Family Medicine Teaching Service

## 2018-11-03 ENCOUNTER — Ambulatory Visit: Payer: Medicare Other | Admitting: Adult Health

## 2018-11-03 ENCOUNTER — Other Ambulatory Visit: Payer: Self-pay | Admitting: Cardiovascular Disease

## 2018-11-03 LAB — CULTURE, BLOOD (ROUTINE X 2)
CULTURE: NO GROWTH
CULTURE: NO GROWTH

## 2018-11-04 ENCOUNTER — Telehealth: Payer: Self-pay | Admitting: Family Medicine

## 2018-11-04 NOTE — Telephone Encounter (Signed)
Received a page on the after-hours line from Travis [name]'s sister who says that he called her because the bag containing the fluid drained from his gallbladder is leaking.  She says that it is leaking around the connection between the tubing and the bag.  She says that her brother said that he can position the bag upside down in order to keep it from leaking.  I told her to let him know that as long as the fluid from his gallbladder is able to drain, he can position the back however he needs to to keep it from leaking.  I advised that the patient either go to an urgent care today to have someone look at this or wait until Monday morning and call the clinic to be seen for this issue.  His sister says that they will probably wait until Monday morning and call the clinic at that time.

## 2018-11-06 ENCOUNTER — Other Ambulatory Visit: Payer: Self-pay | Admitting: Family Medicine

## 2018-11-06 ENCOUNTER — Telehealth: Payer: Self-pay

## 2018-11-06 DIAGNOSIS — Z434 Encounter for attention to other artificial openings of digestive tract: Secondary | ICD-10-CM

## 2018-11-06 NOTE — Telephone Encounter (Signed)
Called patient's sister Travis [name] back to see what she is requesting.  Travis [name] states that patient is in need of an RX for colostomy bags and the syringes of saline for flushing purposes.  Patient was in the ER over the weekend because his bag was leaking. It is draining fine but is leaking.  The bags that patient received after surgery worked fine with no issues. The ER gave him bags that leak.  Travis [name] has called the surgeons office and they told her to call PCP as they had no idea what bags to RX.  Travis [name] just wants the RX for the above supplies and no advice about colostomy itself.  Travis [name], Travis [name], CMA

## 2018-11-06 NOTE — Telephone Encounter (Signed)
Patient sister would like to talk to PCP about problems with colostomy bag. Was D/C'ed from hospital on Friday.  Call back is [phone]  Ples SpecterAlisa Traver Meckes, RN [facility] RN)

## 2018-11-06 NOTE — Telephone Encounter (Signed)
Patient should call surgery office concerning colostomy bags. I cannot give expert opinion on them.  Durward Parcelavid McMullen, DO [facility], PGY-3

## 2018-11-06 NOTE — Telephone Encounter (Signed)
I am happy to do that, however I do not know the specific bag or syringe patient needs.  Please let me know what to order I will be happy to place the order.  Durward Parcelavid McMullen, DO [facility], PGY-3

## 2018-11-07 ENCOUNTER — Ambulatory Visit (INDEPENDENT_AMBULATORY_CARE_PROVIDER_SITE_OTHER): Payer: Medicare Other

## 2018-11-07 DIAGNOSIS — I255 Ischemic cardiomyopathy: Secondary | ICD-10-CM

## 2018-11-07 DIAGNOSIS — I639 Cerebral infarction, unspecified: Secondary | ICD-10-CM

## 2018-11-07 NOTE — Telephone Encounter (Signed)
Great! Thank you.  Durward Parcelavid McMullen, DO [facility], PGY-3

## 2018-11-07 NOTE — Addendum Note (Signed)
Addended by: Jone BasemanFLEEGER, Javon Hupfer D on: 11/07/2018 12:11 PM   Modules accepted: Orders

## 2018-11-07 NOTE — Telephone Encounter (Signed)
Spoke to Francesca JewettLaurie Phelps, patient's sister and gave her the [facility] to [facility] and see if she could get some information from her concerning the supplies needed for patient.  Patient called back after speaking with Byrd HesselbachMaria and stated to Shanda BumpsJessica that everything is taken care of.  Glennie Hawk.Simpson, Michelle R, CMA

## 2018-11-07 NOTE — Progress Notes (Signed)
[facility] Summary Report / Loop Recorder 

## 2018-11-07 NOTE — Telephone Encounter (Signed)
Pts sister calls back, insurance will not cover but supplies are cheap.  She request that the following be sent to [facility] as they require RX before she can pick up.  Cath leg bag - 10  Bottle of saline  Syringes to pull saline up and clean tube  Fax: [phone] Marice Potter([facility])  Ok per Dr. Abelardo DieselMcMullen.  Placed as a DME order as unable to find as another order.  Faxed as requested. Mckinnon Glick, Maryjo RochesterJessica Dawn, CMA

## 2018-11-15 ENCOUNTER — Other Ambulatory Visit: Payer: Self-pay

## 2018-11-15 NOTE — Patient Outreach (Signed)
Second attempt to obtain mRs. No answer. Left message for return call.  

## 2018-11-16 ENCOUNTER — Telehealth: Payer: Self-pay

## 2018-11-16 NOTE — Telephone Encounter (Signed)
Primary Cardiologist: [facility] Pre-operative Risk Assessment    Request for surgical clearance:  1. What type of surgery is being performed?  Laparoscopic Cholecystectomy   2. When is this surgery scheduled? TBD  3. What type of clearance is required (medical clearance vs. Pharmacy clearance to hold med vs. Both)? Both  4. Are there any medications that need to be held prior to surgery and how long? Plavix 5 days prior to surgery  5. Practice name and name of physician performing surgery? [facility]   6. What is your office phone number [phone]   7.   What is your office fax number [phone]  8.   Anesthesia type (None, local, MAC, general) ? General    Travis [name] 11/16/2018, 2:39 PM  _________________________________________________________________   (provider comments below)

## 2018-11-17 NOTE — Telephone Encounter (Signed)
Dr. Duke Salviaandolph, can you comment on pre-op clearance for Lap Chole for this pt please. He was originally seen for clearance for lap chole in 09/2017 and in assessing him he was found to have decreased EF and eventually underwent revascularization with CABG in 11/2017. He has also had a stroke in 06/2018 with loop recorder placed, so far no afib. EF 35-40%. He had biliary drain placed in 08/2018, now plan for lap chole.   You saw him last on 09/22/2018. Does he need to come in for pre-op eval or can we handle over the phone?  Can Plavix be held for 5 days for procedure?  Please route response back to P CV DIV PREOP  Thanks, Coralee NorthNina

## 2018-11-20 ENCOUNTER — Other Ambulatory Visit: Payer: Self-pay

## 2018-11-20 NOTE — Patient Outreach (Signed)
3 outreach attempts were completed to obtain mRs. mRs could not be obtained because patient never returned my calls. mRs=7 

## 2018-11-21 ENCOUNTER — Encounter: Payer: Self-pay | Admitting: Family Medicine

## 2018-11-21 ENCOUNTER — Ambulatory Visit (INDEPENDENT_AMBULATORY_CARE_PROVIDER_SITE_OTHER): Payer: Medicare Other | Admitting: Family Medicine

## 2018-11-21 ENCOUNTER — Other Ambulatory Visit: Payer: Self-pay | Admitting: Family Medicine

## 2018-11-21 ENCOUNTER — Other Ambulatory Visit: Payer: Self-pay

## 2018-11-21 VITALS — BP 130/76 | HR 77 | Temp 98.0°F | Ht 74.0 in | Wt 214.2 lb

## 2018-11-21 DIAGNOSIS — K219 Gastro-esophageal reflux disease without esophagitis: Secondary | ICD-10-CM

## 2018-11-21 DIAGNOSIS — Z434 Encounter for attention to other artificial openings of digestive tract: Secondary | ICD-10-CM

## 2018-11-21 DIAGNOSIS — K3184 Gastroparesis: Secondary | ICD-10-CM

## 2018-11-21 DIAGNOSIS — E1143 Type 2 diabetes mellitus with diabetic autonomic (poly)neuropathy: Secondary | ICD-10-CM | POA: Diagnosis not present

## 2018-11-21 DIAGNOSIS — G4700 Insomnia, unspecified: Secondary | ICD-10-CM

## 2018-11-21 DIAGNOSIS — K811 Chronic cholecystitis: Secondary | ICD-10-CM | POA: Diagnosis not present

## 2018-11-21 MED ORDER — METOCLOPRAMIDE HCL 10 MG PO TABS: 10.0000 mg | ORAL_TABLET | Freq: Four times a day (QID) | ORAL | 1 refills | Status: DC | PRN

## 2018-11-21 MED ORDER — TRAZODONE HCL 50 MG PO TABS: 25.0000 mg | ORAL_TABLET | Freq: Every evening | ORAL | 3 refills | Status: DC | PRN

## 2018-11-21 MED ORDER — LANSOPRAZOLE 30 MG PO CPDR: 30.0000 mg | DELAYED_RELEASE_CAPSULE | Freq: Every day | ORAL | 1 refills | Status: DC

## 2018-11-21 NOTE — Progress Notes (Signed)
Subjective:    Travis [name] is a 47 y.o. male who presents to [facility] today for hospital FU:  1.  Hospital FU: Patient recently hospitalized for initial concern for sepsis secondary to a calculus cholecystitis.  Patient was admitted and started on broad-spectrum IV antibiotics.  He did initially meet sepsis criteria.  However over time thought was that because of continued nausea vomiting he was having issues more from acute on chronic gastroparesis rather than infection.  It is unclear whether he actually did have an infection that cleared within the first 1 or 2 days with broad-spectrum antibiotics.  Either way his gastroparesis flare back up.  He was not on his Reglan prior to admission nor during admission due to miscommunication is the inpatient team believe that he did not want to take his Reglan.  However evidently he tells me today that he is trying to limit his Reglan dose whether this really helps.  He is having actually worsening nausea (though no real vomiting) with erythromycin would like to stop this.  He otherwise eating although he has a decreased appetite.  He has followed with Dr. Redmond Pulling his surgery since discharge.  They are planning for hopeful surgery for his gallbladder in January pending cardiology clearance.  Insomnia:  Also with issues with insomnia.  He is previously taking gabapentin.  He is asking on mirtazapine as he is afraid to take this and it helps.  He has trouble falling asleep and also has trouble waking up at nighttime.  He also struggles anxiety once of the medicine can help with anxiety as well.  GERD: Patient has been having ongoing symptoms with GERD.  He was previously on Protonix but now this is no longer helping.  He would like to be switched something else.  He notices that his GERD is worse with fruits.  No actual chest pain.  No shortness of breath.   ROS as above per HPI.   The following portions of the patient's history were reviewed and updated as  appropriate: allergies, current medications, past medical history, family and social history, and problem list. Patient is a nonsmoker.    PMH reviewed.  Past Medical History:  Diagnosis Date  . Acute on chronic systolic and diastolic heart failure, NYHA class 3 ([place]) 09/14/2017  . Acute osteomyelitis of metatarsal bone of left foot ([place]) 11/12/2015  . CAD in native artery 09/14/2017  . Closed fracture of right tibial plateau 11/11/2015  . Depression with anxiety   . Diabetes mellitus    INSULIN DEPENDENT Type II  . Diabetic peripheral neuropathy associated with type 2 diabetes mellitus ([place]) 08/30/2008   Qualifier: Diagnosis of  By: [name] Done FNP, Tori Milks    . Diabetic ulcer of left foot ([place]) 11/14/2015  . Gastroparesis   . GERD (gastroesophageal reflux disease)   . Hypertension   . Left ventricular aneurysm   . Osteomyelitis ([place])   . Osteoporosis 11/12/2015  . Peripheral neuropathy   . Shortness of breath dyspnea    with exertion  . Vitamin D deficiency 11/12/2015   Past Surgical History:  Procedure Laterality Date  . AMPUTATION Left 11/28/2015   Procedure: Left 4th Ray Amputation;  Surgeon: [name], MD;  Location: [facility];  Service: Orthopedics;  Laterality: Left;  . AMPUTATION Left 10/29/2016   Procedure: LEFT 5TH RAY AMPUTATION;  Surgeon: [name], MD;  Location: [place];  Service: Orthopedics;  Laterality: Left;  . ANTERIOR VITRECTOMY Left 04/12/2016   Procedure: ANTERIOR CHAMBER  [place] OUT WITH GAS FLUID EXCHANGE;  Surgeon: [name], MD;  Location: [place];  Service: Ophthalmology;  Laterality: Left;  . CARDIAC CATHETERIZATION    . CATARACT EXTRACTION Left   . CORONARY ARTERY BYPASS GRAFT N/A 11/29/2017    LIMA-LAD, SVG-PDA, SVG-CFX  Procedure: CORONARY ARTERY BYPASS GRAFTING (CABG) times three using the left saphaneous vien. harvested endoscopicly and left internal mammary artery.;  Surgeon: [name], MD;  Location: [facility];  Service: Open Heart Surgery;   Laterality: N/A;  . EYE SURGERY     left eye surgery 04/2016  . IR CHOLANGIOGRAM EXISTING TUBE  10/17/2018  . IR CHOLANGIOGRAM EXISTING TUBE  10/30/2018  . IR PERC CHOLECYSTOSTOMY  09/05/2018  . KNEE SURGERY Left   . LOOP RECORDER INSERTION N/A 06/28/2018   Procedure: LOOP RECORDER INSERTION;  Surgeon: Deboraha Sprang, MD;  Location: [place] [facility];  Service: Cardiovascular;  Laterality: N/A;  . RIGHT/LEFT HEART CATH AND CORONARY ANGIOGRAPHY N/A 10/19/2017   Procedure: RIGHT/LEFT HEART CATH AND CORONARY ANGIOGRAPHY;  Surgeon: [name], [name], MD;  Location: [place] [facility];  Service: Cardiovascular;  Laterality: N/A;  . SHOULDER SURGERY Right   . TEE WITHOUT CARDIOVERSION N/A 11/29/2017   Procedure: TRANSESOPHAGEAL ECHOCARDIOGRAM (TEE);  Surgeon: [name], Collier Salina, MD;  Location: [place];  Service: Open Heart Surgery;  Laterality: N/A;  . TEE WITHOUT CARDIOVERSION N/A 06/28/2018   Procedure: TRANSESOPHAGEAL ECHOCARDIOGRAM (TEE);  Surgeon: [name], MD;  Location: [facility];  Service: Cardiovascular;  Laterality: N/A;  . TOE AMPUTATION Left 11/28/15   4th toe    Medications reviewed. Current Outpatient Medications  Medication Sig Dispense Refill  . aspirin EC 325 MG EC tablet Take 1 tablet (325 mg total) by mouth daily. 30 tablet 0  . Blood Glucose Monitoring Suppl (ONETOUCH VERIO) w/Device KIT 1 application by Does not apply route 4 (four) times daily. 1 kit 0  . clopidogrel (PLAVIX) 75 MG tablet Take 1 tablet (75 mg total) by mouth daily. 90 tablet 1  . erythromycin (E-MYCIN) 250 MG tablet Take 1 tablet (250 mg total) by mouth 3 (three) times daily before meals. 180 tablet 0  . Ferrous Sulfate (IRON) 325 (65 Fe) MG TABS Take 1 tablet (325 mg total) by mouth 2 (two) times daily after a meal. 30 each 0  . furosemide (LASIX) 20 MG tablet Take 1 tablet (20 mg total) by mouth daily. 90 tablet 3  . gabapentin (NEURONTIN) 800 MG tablet TAKE 1 TABLET(800 MG) BY MOUTH THREE TIMES DAILY  (Patient taking differently: Take 2,400 mg by mouth at bedtime. ) 90 tablet 0  . glucose blood (ONETOUCH VERIO) test strip 1 each by Other route as needed for other. Use as instructed 100 each 12  . insulin detemir (LEVEMIR) 100 UNIT/ML injection Inject 0.2 mLs (20 Units total) into the skin at bedtime. Take 1/2 of normal dose tonight 06/27/18 for planned procedure tomorrow. Then resume normal dose (Patient taking differently: Inject 20 Units into the skin at bedtime. Take 20 units at qhs) 30 mL 3  . losartan (COZAAR) 25 MG tablet Take 12.5 mg by mouth daily.   5  . losartan (COZAAR) 25 MG tablet Take 0.5 tablets (12.5 mg total) by mouth daily. 1/2 tablet by mouth daily 15 tablet 4  . metFORMIN (GLUCOPHAGE XR) 750 MG 24 hr tablet TAKE 1 TAB DAILY FOR 1 WEEK THEN INCREASE TO 2 TABS DAILY 180 tablet 3  . metoprolol succinate (TOPROL XL) 25 MG  24 hr tablet Take 0.5 tablets (12.5 mg total) by mouth daily. 30 tablet 5  . metoprolol succinate (TOPROL-XL) 25 MG 24 hr tablet Take 0.5 tablets (12.5 mg total) by mouth daily. 15 tablet 4  . ondansetron (ZOFRAN) 4 MG tablet Take 1 tablet (4 mg total) by mouth every 8 (eight) hours as needed for nausea or vomiting. 20 tablet 0  . ondansetron (ZOFRAN) 4 MG/2ML SOLN injection Inject 2 mLs (4 mg total) into the vein every 6 (six) hours as needed for nausea. 2 mL 0  . ONETOUCH DELICA LANCETS 29J MISC 1 application by Does not apply route 4 (four) times daily. 100 each 12  . pantoprazole (PROTONIX) 40 MG tablet Take 1 tablet (40 mg total) by mouth daily. 30 tablet 2  . rosuvastatin (CRESTOR) 40 MG tablet Take 1 tablet (40 mg total) by mouth daily. 30 tablet 5   No current facility-administered medications for this visit.      Objective:   Physical Exam BP 130/76   Pulse 77   Temp 98 F (36.7 C) (Oral)   Ht '6\' 2"'  (1.88 m)   Wt 214 lb 3.2 oz (97.2 kg)   SpO2 99%   BMI 27.50 kg/m  Gen:  Alert, cooperative patient who appears stated age in no acute distress.   Vital signs reviewed.  THin male, chronically ill appearing.   HEENT: EOMI,  MMM Cardiac:  Regular rate and rhythm  Pulm:  Clear to auscultation bilaterally  Abd:  Soft/nondistended.  Minimal epigastric tenderness.  Moderate RUQ tenderness.  No signs of redness or infection around cholecystotomy tube. Exts: Non edematous BL  LE, warm and well perfused.   No results found for this or any previous visit (from the past 72 hour(s)).

## 2018-11-21 NOTE — Patient Instructions (Signed)
It was good to see you today  Take the Reglan twice a day as needed, up to 4 times a day.  Take the Trazodone about 30 minutes before bed.  This also helps with anxiety.   Stop the Protonix.  Start nexium once a day instead.    I'll let you know the results of your labs.  Best of luck with the surgery next month!

## 2018-11-21 NOTE — Telephone Encounter (Signed)
He can be handled over the phone.  If stable, OK for surgery.

## 2018-11-22 LAB — COMPREHENSIVE METABOLIC PANEL
ALT: 49 IU/L — ABNORMAL HIGH (ref 0–44)
AST: 43 IU/L — AB (ref 0–40)
Albumin/Globulin Ratio: 2.2 (ref 1.2–2.2)
Albumin: 4.4 g/dL (ref 3.5–5.5)
Alkaline Phosphatase: 77 IU/L (ref 39–117)
BILIRUBIN TOTAL: 1.3 mg/dL — AB (ref 0.0–1.2)
BUN/Creatinine Ratio: 9 (ref 9–20)
BUN: 12 mg/dL (ref 6–24)
CO2: 24 mmol/L (ref 20–29)
Calcium: 9.5 mg/dL (ref 8.7–10.2)
Chloride: 94 mmol/L — ABNORMAL LOW (ref 96–106)
Creatinine, Ser: 1.37 mg/dL — ABNORMAL HIGH (ref 0.76–1.27)
GFR calc Af Amer: 70 mL/min/{1.73_m2} (ref >59)
GFR calc non Af Amer: 61 mL/min/{1.73_m2} (ref >59)
Globulin, Total: 2 g/dL (ref 1.5–4.5)
Glucose: 164 mg/dL — ABNORMAL HIGH (ref 65–99)
Potassium: 3.3 mmol/L — ABNORMAL LOW (ref 3.5–5.2)
SODIUM: 135 mmol/L (ref 134–144)
Total Protein: 6.4 g/dL (ref 6.0–8.5)

## 2018-11-22 LAB — HEPATITIS C ANTIBODY: Hep C Virus Ab: <0.1 s/co ratio (ref 0.0–0.9)

## 2018-11-22 LAB — CBC
HEMOGLOBIN: 13.1 g/dL (ref 13.0–17.7)
Hematocrit: 37.6 % (ref 37.5–51.0)
MCH: 29.7 pg (ref 26.6–33.0)
MCHC: 34.8 g/dL (ref 31.5–35.7)
MCV: 85 fL (ref 79–97)
Platelets: 293 10*3/uL (ref 150–450)
RBC: 4.41 x10E6/uL (ref 4.14–5.80)
RDW: 13.1 % (ref 12.3–15.4)
WBC: 7.8 10*3/uL (ref 3.4–10.8)

## 2018-11-22 LAB — HEPATITIS B CORE AB W/REFLEX: Hep B Core Total Ab: NEGATIVE

## 2018-11-22 NOTE — Telephone Encounter (Signed)
   Primary Cardiologist: [facility] Siiffany [place], MD  Chart reviewed as part of pre-operative protocol coverage. Patient was contacted 11/22/2018 in reference to pre-operative risk assessment for pending surgery as outlined below.  Travis [name] was last seen on 09/22/2018 by Dr. Duke Salviaandolph.  Since that day, Travis CabalMichael T [name] has done well from a cardiac standpoint.  He has had no chest discomfort, shortness of breath, orthopnea.  He had some mild edema which over the last few weeks has been much better.   Therefore, based on ACC/AHA guidelines, the patient would be at acceptable risk for the planned procedure without further cardiovascular testing.  His beta-blocker should be continued perioperatively if tolerated to reduce his cardiac risk.  His Plavix is prescribed by neurology, Dr. Pearlean BrownieSethi.  Interruption and Plavix for surgery will have to be approved by Dr. Pearlean BrownieSethi.  I will route this recommendation to the requesting party via Epic fax function and remove from pre-op pool.  Please call with questions.  Travis BonJanine Sheena Donegan, NP 11/22/2018, 1:40 PM

## 2018-11-22 NOTE — Telephone Encounter (Signed)
I attempted call to patient to check on his status, no answer.  Voicemail left on his cell phone.  I also called his sister and explained need to speak with patient.  She will try to get him to call me back.

## 2018-11-22 NOTE — Telephone Encounter (Signed)
Clearance to hold Plavix will have to be done by neurology as they have the ordering provider in setting of stroke.

## 2018-11-24 ENCOUNTER — Encounter: Payer: Self-pay | Admitting: Family Medicine

## 2018-11-24 NOTE — Assessment & Plan Note (Signed)
Starting patient on trazodone today.  FU with PCP in next 2 weeks to assess for improvement.  Should hopefully also help some with anxiety.  Low dose may need to increase.

## 2018-11-24 NOTE — Assessment & Plan Note (Signed)
Initially better with Protonix then but this is no longer helping.  We will switch him today to Prevacid as this is covered by his insurance.

## 2018-11-24 NOTE — Assessment & Plan Note (Signed)
Restarting patient back on his Reglan.  This is written for times a day that he is currently only taking it twice a day.  He should continue to follow with his PCP closely.  No signs of tardive dyskinesia.

## 2018-11-24 NOTE — Assessment & Plan Note (Signed)
Currently doing well.  No drainage from the site although the tube and the bag themselves have been leaking.  Not currently leaking.

## 2018-12-01 ENCOUNTER — Telehealth: Payer: Self-pay | Admitting: Family Medicine

## 2018-12-01 NOTE — Telephone Encounter (Signed)
Called patient to discuss minimally elevated creatinine and LFTs.  Recommend having this rechecked next week.  Also wanted to check and see how he's feeling.

## 2018-12-11 ENCOUNTER — Ambulatory Visit (INDEPENDENT_AMBULATORY_CARE_PROVIDER_SITE_OTHER): Payer: Medicare Other

## 2018-12-11 DIAGNOSIS — I639 Cerebral infarction, unspecified: Secondary | ICD-10-CM | POA: Diagnosis not present

## 2018-12-11 NOTE — Progress Notes (Signed)
[facility] Summary Report / Loop Recorder 

## 2018-12-12 LAB — CUP PACEART REMOTE DEVICE CHECK
Date Time Interrogation Session: 20191229184013
Implantable Pulse Generator Implant Date: 20190717

## 2018-12-21 ENCOUNTER — Ambulatory Visit: Payer: Self-pay | Admitting: General Surgery

## 2018-12-24 LAB — CUP PACEART REMOTE DEVICE CHECK
Date Time Interrogation Session: 20191126170924
Implantable Pulse Generator Implant Date: 20190717

## 2018-12-26 ENCOUNTER — Encounter: Payer: Self-pay | Admitting: Cardiovascular Disease

## 2018-12-26 ENCOUNTER — Ambulatory Visit (INDEPENDENT_AMBULATORY_CARE_PROVIDER_SITE_OTHER): Payer: Medicare Other | Admitting: Cardiovascular Disease

## 2018-12-26 VITALS — BP 120/72 | HR 78 | Ht 74.0 in | Wt 218.8 lb

## 2018-12-26 DIAGNOSIS — I5042 Chronic combined systolic (congestive) and diastolic (congestive) heart failure: Secondary | ICD-10-CM

## 2018-12-26 DIAGNOSIS — I25810 Atherosclerosis of coronary artery bypass graft(s) without angina pectoris: Secondary | ICD-10-CM | POA: Diagnosis not present

## 2018-12-26 DIAGNOSIS — E7800 Pure hypercholesterolemia, unspecified: Secondary | ICD-10-CM | POA: Diagnosis not present

## 2018-12-26 DIAGNOSIS — Z01818 Encounter for other preprocedural examination: Secondary | ICD-10-CM | POA: Diagnosis not present

## 2018-12-26 DIAGNOSIS — I5043 Acute on chronic combined systolic (congestive) and diastolic (congestive) heart failure: Secondary | ICD-10-CM

## 2018-12-26 NOTE — Patient Instructions (Addendum)
Medication Instructions:  Your physician recommends that you continue on your current medications as directed. Please refer to the Current Medication list given to you today.  If you need a refill on your cardiac medications before your next appointment, please call your pharmacy.   Lab work: NONE  Testing/Procedures: NONE  Follow-Up: At [facility], you and your health needs are our priority.  As part of our continuing mission to provide you with exceptional heart care, we have created designated Provider Care Teams.  These Care Teams include your primary Cardiologist (physician) and Advanced Practice Providers (APPs -  Physician Assistants and Nurse Practitioners) who all work together to provide you with the care you need, when you need it. You will need a follow up appointment in 4 months. You may see Tiffany [place], MD or one of the following Advanced Practice Providers on your designated Care Team:   Luke Kilroy, PA-C Krista Kroeger, PA-C . Callie Goodrich, PA-C    

## 2018-12-26 NOTE — Progress Notes (Signed)
Cards clearance -see 11-16-18 tele note epic  LOV cards 12-26-2018  LOV neuro 08-02-18 epic   EKG 11-02-18 epic  ECHO 06-28-18 epic  STRESS TEST 06-23-18 epic   CXR 10-29-18 epic

## 2018-12-26 NOTE — Progress Notes (Signed)
Cardiology Office Note   Date:  12/26/2018   ID:  Travis [name], DOB [birth year], MRN [id number]  PCP:  [place] Bing, DO  Cardiologist:   Skeet Latch, MD  General Surgeon: Dr. [name]   No chief complaint on file.    History of Present Illness: Travis [name] is a 48 y.o. male with chronic systolic and diastolic heart failure, embolic stroke, diabetes, and hyperlipidemia, who presents for follow up.  He was initially seen 09/14/17 for pre-surgical risk assessment prior to cholesystectomy.  Travis [name] was evaluated by Dr. [name] for gallbladder stones and biliary dyskinesia.  At that appointment he reported exertional dyspnea.  It was noted that Travis [name] had a Lexiscan Myoview 07/29/16 that revealed LVEF 26% with infarct in the mid anteroseptal, inferoseptal and apical regions.  There was no reported ischemia.  Hypokinesis was noted in the apical and mild-distal anterior and inferior walls.  Dr. Redmond Pulling recommended laparoscopic cholecystectomy but he was referred for cardiac clearance. Since his stress test Travis [name] developed osteomyelitis of the L 5th toe and underwent general anesthesia 10/2016.  Travis [name] was diagnosed with diabetes in around 2008.  He did not have insurance and his diabetes was not under control for many years. In 2018 he started working hard and his hemoglobin A1c has been reduced from 11% to 5%.   Travis [name] reported feeling very fatigued and had exertional dyspnea.  He was started on aspirin, metoprolol, losartan, and rosuvastatin.  He was referred for a cardiac MRI 09/2017 that revealed LVEF 41% with a moderately dilated left ventricle with an apical aneurysm involving apex, apical inferior, septal, and anterior walls.  There was akinesis of the mid anteroseptal and inferoseptal walls. There was nearly transmural late gadolinium enhancement in the mid-apical anterior, septal and inferior walls with no chance of recovery with revascularization.  The  inferoseptum had good change of recovery with revascularization.  There was also mild MR.  He underwent LHC 10/19/17 which showed 65% OM1, 100% LAD, 50-80% RCA and 70% PDA stenoses.  Revascularization with CABG was recommended.  He underwent 3v CABG (LIMA-->LAD, SVG-->PDA, SVG-->LCX) on 11/29/17.  He followed up with Kerin Ransom 1/19 and was feeling weak.  He was noted to have L pleural effusion vs. LLL collapse and was started on lasix.  He was admitted with stroke 06/2018.  He had TPA and still has slurred speech but has otherwise recovered.  He had a biliary drain placed by IR 08/2018.    Since his last appointment Travis [name] has been doing well.  He has struggled with gastroparesis.  Overall this is been better controlled since he was started on Reglan.  Today he had some nausea that started while driving in the car.  This was associated with emesis.  In general it has been better controlled.  He has not had any chest pain or shortness of breath.  He denies lower extremity edema, orthopnea, or PND.  He is looking forward to having his gallbladder removed on 1/23.  He complains of insomnia.  He has difficulty both falling asleep and staying asleep.  He has tried many medications without any improvement.   Past Medical History:  Diagnosis Date  . Acute on chronic systolic and diastolic heart failure, NYHA class 3 ([place]) 09/14/2017  . Acute osteomyelitis of metatarsal bone of left foot ([place]) 11/12/2015  . CAD in native artery 09/14/2017  . Closed fracture of right tibial plateau 11/11/2015  . Depression  with anxiety   . Diabetes mellitus    INSULIN DEPENDENT Type II  . Diabetic peripheral neuropathy associated with type 2 diabetes mellitus ([place]) 08/30/2008   Qualifier: Diagnosis of  By: [name] Done FNP, Tori Milks    . Diabetic ulcer of left foot ([place]) 11/14/2015  . Gastroparesis   . GERD (gastroesophageal reflux disease)   . Hypertension   . Left ventricular aneurysm   . Osteomyelitis ([place])   . Osteoporosis  11/12/2015  . Peripheral neuropathy   . Shortness of breath dyspnea    with exertion  . Vitamin D deficiency 11/12/2015    Past Surgical History:  Procedure Laterality Date  . AMPUTATION Left 11/28/2015   Procedure: Left 4th Ray Amputation;  Surgeon: [name], MD;  Location: [facility];  Service: Orthopedics;  Laterality: Left;  . AMPUTATION Left 10/29/2016   Procedure: LEFT 5TH RAY AMPUTATION;  Surgeon: [name], MD;  Location: [facility];  Service: Orthopedics;  Laterality: Left;  . ANTERIOR VITRECTOMY Left 04/12/2016   Procedure: ANTERIOR CHAMBER [place] OUT WITH GAS FLUID EXCHANGE;  Surgeon: [name], MD;  Location: [facility];  Service: Ophthalmology;  Laterality: Left;  . CARDIAC CATHETERIZATION    . CATARACT EXTRACTION Left   . CORONARY ARTERY BYPASS GRAFT N/A 11/29/2017    LIMA-LAD, SVG-PDA, SVG-CFX  Procedure: CORONARY ARTERY BYPASS GRAFTING (CABG) times three using the left saphaneous vien. harvested endoscopicly and left internal mammary artery.;  Surgeon: [name], MD;  Location: [facility];  Service: Open Heart Surgery;  Laterality: N/A;  . EYE SURGERY     left eye surgery 04/2016  . IR CHOLANGIOGRAM EXISTING TUBE  10/17/2018  . IR CHOLANGIOGRAM EXISTING TUBE  10/30/2018  . IR PERC CHOLECYSTOSTOMY  09/05/2018  . KNEE SURGERY Left   . LOOP RECORDER INSERTION N/A 06/28/2018   Procedure: LOOP RECORDER INSERTION;  Surgeon: Deboraha Sprang, MD;  Location: [facility];  Service: Cardiovascular;  Laterality: N/A;  . RIGHT/LEFT HEART CATH AND CORONARY ANGIOGRAPHY N/A 10/19/2017   Procedure: RIGHT/LEFT HEART CATH AND CORONARY ANGIOGRAPHY;  Surgeon: [name], [name], MD;  Location: [facility];  Service: Cardiovascular;  Laterality: N/A;  . SHOULDER SURGERY Right   . TEE WITHOUT CARDIOVERSION N/A 11/29/2017   Procedure: TRANSESOPHAGEAL ECHOCARDIOGRAM (TEE);  Surgeon: [name], Collier Salina, MD;  Location: [facility];  Service: Open Heart Surgery;  Laterality: N/A;  . TEE WITHOUT  CARDIOVERSION N/A 06/28/2018   Procedure: TRANSESOPHAGEAL ECHOCARDIOGRAM (TEE);  Surgeon: [name], MD;  Location: [facility];  Service: Cardiovascular;  Laterality: N/A;  . TOE AMPUTATION Left 11/28/15   4th toe     Current Outpatient Medications  Medication Sig Dispense Refill  . aspirin EC 325 MG EC tablet Take 1 tablet (325 mg total) by mouth daily. 30 tablet 0  . Blood Glucose Monitoring Suppl (ONETOUCH VERIO) w/Device KIT 1 application by Does not apply route 4 (four) times daily. 1 kit 0  . clopidogrel (PLAVIX) 75 MG tablet Take 1 tablet (75 mg total) by mouth daily. 90 tablet 1  . Ferrous Sulfate (IRON) 325 (65 Fe) MG TABS Take 1 tablet (325 mg total) by mouth 2 (two) times daily after a meal. 30 each 0  . furosemide (LASIX) 20 MG tablet Take 1 tablet (20 mg total) by mouth daily. 90 tablet 3  . glucose blood (ONETOUCH VERIO) test strip 1 each by Other route as needed for other. Use as instructed 100 each 12  . insulin detemir (LEVEMIR)  100 UNIT/ML injection Inject 0.2 mLs (20 Units total) into the skin at bedtime. Take 1/2 of normal dose tonight 06/27/18 for planned procedure tomorrow. Then resume normal dose (Patient taking differently: Inject 20 Units into the skin at bedtime. Take 20 units at qhs) 30 mL 3  . lansoprazole (PREVACID) 30 MG capsule TAKE ONE CAPSULE BY MOUTH DAILY AT 12 NOON 90 capsule 1  . losartan (COZAAR) 25 MG tablet Take 12.5 mg by mouth daily.   5  . metFORMIN (GLUCOPHAGE XR) 750 MG 24 hr tablet TAKE 1 TAB DAILY FOR 1 WEEK THEN INCREASE TO 2 TABS DAILY 180 tablet 3  . metoCLOPramide (REGLAN) 10 MG tablet Take 1 tablet (10 mg total) by mouth every 6 (six) hours as needed for nausea. 60 tablet 1  . metoprolol succinate (TOPROL-XL) 25 MG 24 hr tablet Take 0.5 tablets (12.5 mg total) by mouth daily. 15 tablet 4  . ondansetron (ZOFRAN) 4 MG tablet Take 1 tablet (4 mg total) by mouth every 8 (eight) hours as needed for nausea or vomiting. 20 tablet 0  . ONETOUCH  DELICA LANCETS 40X MISC 1 application by Does not apply route 4 (four) times daily. 100 each 12  . rosuvastatin (CRESTOR) 40 MG tablet Take 1 tablet (40 mg total) by mouth daily. 30 tablet 5  . traZODone (DESYREL) 50 MG tablet Take 0.5-1 tablets (25-50 mg total) by mouth at bedtime as needed for sleep. 30 tablet 3   No current facility-administered medications for this visit.     Allergies:   Patient has no known allergies.    Social History:  The patient  reports that he has never smoked. He has never used smokeless tobacco. He reports that he does not drink alcohol or use drugs.   Family History:  The patient's family history includes Anxiety disorder in his mother; COPD in his mother; Cancer in his paternal grandmother; Depression in his mother; Diabetes in his maternal grandfather, maternal grandmother, maternal uncle, and sister; Esophageal cancer in his father; Fibromyalgia in his mother and sister; GER disease in his sister; Heart failure in his mother; Hemachromatosis in his paternal grandfather; Liver disease in his paternal grandfather.    ROS:  Please see the history of present illness.   Otherwise, review of systems are positive for none.   All other systems are reviewed and negative.    PHYSICAL EXAM: VS:  BP 120/72   Pulse 78   Ht '6\' 2"'  (1.88 m)   Wt 218 lb 12.8 oz (99.2 kg)   BMI 28.09 kg/m  , BMI Body mass index is 28.09 kg/m. GENERAL:  Chronically ill-appearing HEENT: Pupils equal round and reactive, fundi not visualized, oral mucosa unremarkable NECK:  No jugular venous distention, waveform within normal limits, carotid upstroke brisk and symmetric, no bruits LUNGS:  Clear to auscultation bilaterally HEART:  RRR.  PMI not displaced or sustained,S1 and S2 within normal limits, no S3, no S4, no clicks, no rubs, no murmurs ABD:  Flat, positive bowel sounds normal in frequency in pitch, no bruits, no rebound, no guarding, no midline pulsatile mass, no hepatomegaly, no  splenomegaly EXT:  2 plus pulses throughout, no edema, no cyanosis no clubbing SKIN:  No rashes no nodules NEURO:  Cranial nerves II through XII grossly intact, motor grossly intact throughout PSYCH:  Cognitively intact, oriented to person place and time   EKG:  EKG is not ordered today. The ekg ordered 09/14/17 demonstrates sinus rhythm. Rate 77 bpm. Prior anterior infarct.  Inferior ST elevations <53m.  cannot rule out prior inferior infarct. Unchanged from 05/21/16. 09/22/18: Sinus rhythm.  Rate 67 bpm.  Prior anterolateral infarct.  [place]8/17/17:  Defect 1: There is a medium defect of severe severity present in the mid anteroseptal, mid inferoseptal and apex location.  Findings consistent with prior myocardial infarction. There is no ischemia identified.  This is an intermediate risk study based upon severely reduced ejection fraction consistent with prior infarction.  The left ventricular ejection fraction is severely decreased (<30%).  Nuclear stress EF: 26%.  Cardiac MRI 10/05/17: IMPRESSION: 1. Moderately dilated left ventricle with mild basal septal hypertrophy and moderately decreased systolic function (LVEF = 41%). There is apical aneurysm involving apical inferior, septal, anterior walls and true apex and akinesis of the mid anteroseptal and inferoseptal walls. There is no evidence for a thrombus.  There is almost transmural late gadolinium enhancement in the mid anteroseptal, apical anterior, septal, inferior walls and in the true septum with no chance of recovery if revascularized. Mid inferoseptal wall has good chance of recovery if revascularized.  2. Normal right ventricular size, thickness and systolic function (LVEF ==17%. There are no regional wall motion abnormalities.  3. Mild mitral and trivial tricuspid regurgitation.  4. Trivial pericardial effusion with no signs of tamponade.  LHC/RHC 140/8/14  LV end diastolic pressure is  normal.  LV end diastolic pressure is normal.  Ost 1st Mrg to 1st Mrg lesion is 65% stenosed.  Ost LAD to Prox LAD lesion is 100% stenosed.  Prox RCA lesion is 50% stenosed.  Mid RCA lesion is 60% stenosed.  Dist RCA lesion is 80% stenosed.  Ost RPDA to RPDA lesion is 70% stenosed.   1. Severe 3 vessel obstructive CAD.    - 100% proximal LAD with left to left and right to left collaterals.    - 60-70% very large OM1    - 60% mid RCA, Long 80% distal RCA extending down into the PDA 2. Normal LVEDP 3. Normal right heart pressures and cardiac output.  Plan: patient has complex, multivessel CAD with LV dysfunction. Should consider revascularization with CABG in a longstanding diabetic.    Recent Labs: 03/03/2018: TSH 1.960 11/01/2018: Magnesium 2.0 11/21/2018: ALT 49; BUN 12; Creatinine, Ser 1.37; Hemoglobin 13.1; Platelets 293; Potassium 3.3; Sodium 135    Lipid Panel    Component Value Date/Time   CHOL 141 06/26/2018 0213   CHOL 75 (L) 01/23/2018 0913   TRIG 211 (H) 06/26/2018 0213   HDL 26 (L) 06/26/2018 0213   HDL 32 (L) 01/23/2018 0913   CHOLHDL 5.4 06/26/2018 0213   VLDL 42 (H) 06/26/2018 0213   LDLCALC 73 06/26/2018 0213   LDLCALC 29 01/23/2018 0913      Wt Readings from Last 3 Encounters:  12/26/18 218 lb 12.8 oz (99.2 kg)  11/21/18 214 lb 3.2 oz (97.2 kg)  10/30/18 218 lb 4.1 oz (99 kg)      ASSESSMENT AND PLAN:  # Chronic systolic and diastolic heart failure:  LVEF 35-40%.  He is euvolemic on exam.  Continue metoprolol, losartan and furosemide.  # CAD s/p CABG: # Hyperlipidemia:  Lexiscan Myoview showed evidence of a prior infarct.  He had 3 vessel CAD on cath and underwent CABG 11/2017.  Continue aspirin, clopidogrel, metoprolol and rosuvastatin.  He is doing well and has no angina.   # CVA: Loop recorder in place.  No arrhythmias as of 11/2018.    Current medicines are reviewed at length with  the patient today.  The patient does not have  concerns regarding medicines.  The following changes have been made:  None   Labs/ tests ordered today include:   No orders of the defined types were placed in this encounter.    Disposition:   FU with Brittinee Risk C. Oval Linsey, MD, [facility] in 4 months     Signed, Mykel Mohl C. Oval Linsey, MD, [facility]  12/26/2018 12:12 PM    [place]

## 2018-12-26 NOTE — Patient Instructions (Addendum)
Travis CabalMichael T [name]  12/26/2018   Your procedure is scheduled on: 01-04-2019    [facility]     Report to admitting at 7:30AM    Call this number if you have problems the morning of surgery [phone]     Remember: NO SOLID FOOD AFTER MIDNIGHT THE NIGHT PRIOR TO SURGERY. NOTHING BY MOUTH EXCEPT CLEAR LIQUIDS UNTIL 3 HOURS PRIOR TO SCHEULED SURGERY. PLEASE FINISH ENSURE DRINK PER SURGEON ORDER 3 HOURS PRIOR TO SCHEDULED SURGERY TIME WHICH NEEDS TO BE COMPLETED AT ____6:30AM________. BRUSH YOUR TEETH MORNING OF SURGERY AND RINSE YOUR MOUTH OUT, NO CHEWING GUM CANDY OR MINTS.     CLEAR LIQUID DIET   Foods Allowed                                                                     Foods Excluded  Coffee and tea, regular and decaf                             liquids that you cannot  Plain Jell-O in any flavor                                             see through such as: Fruit ices (not with fruit pulp)                                     milk, soups, orange juice  Iced Popsicles                                    All solid food Carbonated beverages, regular and diet                                    Cranberry, grape and apple juices Sports drinks like Gatorade Lightly seasoned clear broth or consume(fat free) Sugar, honey syrup  Sample Menu Breakfast                                Lunch                                     Supper Cranberry juice                    Beef broth                            Chicken broth Jell-O  Grape juice                           Apple juice Coffee or tea                        Jell-O                                      Popsicle                                                Coffee or tea                        Coffee or tea  _____________________________________________________________________       Take these medicines the morning of surgery with A SIP OF  WATER: none  LEVEMIR- take half dose of Levemir insulin the night before surgery  DO NOT TAKE ANY ORAL DIABETIC MEDICATIONS DAY OF YOUR SURGERY                               You may not have any metal on your body including hair pins and              piercings  Do not wear jewelry, make-up, lotions, powders or perfumes, deodorant                        Men may shave face and neck.   Do not bring valuables to the hospital. [facility] IS NOT             RESPONSIBLE   FOR VALUABLES.  Contacts, dentures or bridgework may not be worn into surgery.      Patients discharged the day of surgery will not be allowed to drive home. IF YOU ARE HAVING SURGERY AND GOING HOME THE SAME DAY, YOU MUST HAVE AN ADULT TO DRIVE YOU HOME AND BE WITH YOU FOR 24 HOURS. YOU MAY GO HOME BY TAXI OR UBER OR ORTHERWISE, BUT AN ADULT MUST ACCOMPANY YOU HOME AND STAY WITH YOU FOR 24 HOURS.  Name and phone number of your driver:  Special Instructions: N/A              Please read over the following fact sheets you were given: _____________________________________________________________________             [facility] - Preparing for Surgery Before surgery, you can play an important role.  Because skin is not sterile, your skin needs to be as free of germs as possible.  You can reduce the number of germs on your skin by washing with CHG (chlorahexidine gluconate) soap before surgery.  CHG is an antiseptic cleaner which kills germs and bonds with the skin to continue killing germs even after washing. Please DO NOT use if you have an allergy to CHG or antibacterial soaps.  If your skin becomes reddened/irritated stop using the CHG and inform your nurse when you arrive at Short Stay. Do not shave (including legs and underarms) for at least 48 hours prior to the first CHG shower.  You may shave  your face/neck. Please follow these instructions carefully:  1.  Shower with CHG Soap the night before surgery and the  morning  of Surgery.  2.  If you choose to wash your hair, wash your hair first as usual with your  normal  shampoo.  3.  After you shampoo, rinse your hair and body thoroughly to remove the  shampoo.                           4.  Use CHG as you would any other liquid soap.  You can apply chg directly  to the skin and wash                       Gently with a scrungie or clean washcloth.  5.  Apply the CHG Soap to your body ONLY FROM THE NECK DOWN.   Do not use on face/ open                           Wound or open sores. Avoid contact with eyes, ears mouth and genitals (private parts).                       Wash face,  Genitals (private parts) with your normal soap.             6.  Wash thoroughly, paying special attention to the area where your surgery  will be performed.  7.  Thoroughly rinse your body with warm water from the neck down.  8.  DO NOT shower/wash with your normal soap after using and rinsing off  the CHG Soap.                9.  Pat yourself dry with a clean towel.            10.  Wear clean pajamas.            11.  Place clean sheets on your bed the night of your first shower and do not  sleep with pets. Day of Surgery : Do not apply any lotions/deodorants the morning of surgery.  Please wear clean clothes to the hospital/surgery center.  FAILURE TO FOLLOW THESE INSTRUCTIONS MAY RESULT IN THE CANCELLATION OF YOUR SURGERY PATIENT SIGNATURE_________________________________  NURSE SIGNATURE__________________________________  ________________________________________________________________________

## 2018-12-28 ENCOUNTER — Encounter (HOSPITAL_COMMUNITY): Payer: Self-pay

## 2018-12-28 ENCOUNTER — Encounter (HOSPITAL_COMMUNITY)
Admission: RE | Admit: 2018-12-28 | Discharge: 2018-12-28 | Disposition: A | Discharge status: Home / Self Care | Payer: Medicare Other | Source: Ambulatory Visit | Attending: General Surgery | Admitting: General Surgery

## 2018-12-28 ENCOUNTER — Other Ambulatory Visit: Payer: Self-pay

## 2018-12-28 DIAGNOSIS — K811 Chronic cholecystitis: Secondary | ICD-10-CM | POA: Insufficient documentation

## 2018-12-28 DIAGNOSIS — Z01812 Encounter for preprocedural laboratory examination: Secondary | ICD-10-CM | POA: Diagnosis present

## 2018-12-28 LAB — CBC WITH DIFFERENTIAL/PLATELET
Abs Immature Granulocytes: 0.02 10*3/uL (ref 0.00–0.07)
Basophils Absolute: 0.1 10*3/uL (ref 0.0–0.1)
Basophils Relative: 1 %
Eosinophils Absolute: 0.3 10*3/uL (ref 0.0–0.5)
Eosinophils Relative: 3 %
HCT: 42.1 % (ref 39.0–52.0)
Hemoglobin: 13.6 g/dL (ref 13.0–17.0)
Immature Granulocytes: 0 %
Lymphocytes Relative: 22 %
Lymphs Abs: 1.7 10*3/uL (ref 0.7–4.0)
MCH: 29.2 pg (ref 26.0–34.0)
MCHC: 32.3 g/dL (ref 30.0–36.0)
MCV: 90.3 fL (ref 80.0–100.0)
Monocytes Absolute: 0.5 10*3/uL (ref 0.1–1.0)
Monocytes Relative: 6 %
Neutro Abs: 5.3 10*3/uL (ref 1.7–7.7)
Neutrophils Relative %: 68 %
Platelets: 296 10*3/uL (ref 150–400)
RBC: 4.66 MIL/uL (ref 4.22–5.81)
RDW: 13.1 % (ref 11.5–15.5)
WBC: 7.8 10*3/uL (ref 4.0–10.5)
nRBC: 0 % (ref 0.0–0.2)

## 2018-12-28 LAB — COMPREHENSIVE METABOLIC PANEL
ALT: 19 U/L (ref 0–44)
AST: 19 U/L (ref 15–41)
Albumin: 4.2 g/dL (ref 3.5–5.0)
Alkaline Phosphatase: 61 U/L (ref 38–126)
Anion gap: 10 (ref 5–15)
BUN: 13 mg/dL (ref 6–20)
CO2: 25 mmol/L (ref 22–32)
Calcium: 9.1 mg/dL (ref 8.9–10.3)
Chloride: 102 mmol/L (ref 98–111)
Creatinine, Ser: 0.95 mg/dL (ref 0.61–1.24)
GFR calc Af Amer: >60 mL/min (ref >60)
GFR calc non Af Amer: >60 mL/min (ref >60)
Glucose, Bld: 122 mg/dL — ABNORMAL HIGH (ref 70–99)
Potassium: 4.6 mmol/L (ref 3.5–5.1)
Sodium: 137 mmol/L (ref 135–145)
TOTAL PROTEIN: 7.1 g/dL (ref 6.5–8.1)
Total Bilirubin: 1 mg/dL (ref 0.3–1.2)

## 2018-12-28 LAB — PROTIME-INR
INR: 0.95
Prothrombin Time: 12.6 s (ref 11.4–15.2)

## 2018-12-28 LAB — HEMOGLOBIN A1C
Hgb A1c MFr Bld: 6.9 % — ABNORMAL HIGH (ref 4.8–5.6)
Mean Plasma Glucose: 151.33 mg/dL

## 2018-12-28 LAB — APTT: aPTT: 33 seconds (ref 24–36)

## 2018-12-28 LAB — ABO/RH: ABO/RH(D): A NEG

## 2018-12-28 LAB — GLUCOSE, CAPILLARY: Glucose-Capillary: 112 mg/dL — ABNORMAL HIGH (ref 70–99)

## 2018-12-28 NOTE — Progress Notes (Signed)
PRE-OP APPT PROGRESS NOTE  Per patient , his PC Dr Durward Parcel Rx Plavix now .  Special needs column in surgery schedule notes " stop Plavix, ok to continue ASA" Patient reports no one had given him instruction for meds to stop before surgery. RN encouraged patent to reach out to his surgeon .  Chart placed in APP basket for f/u

## 2018-12-29 ENCOUNTER — Telehealth: Payer: Self-pay

## 2018-12-29 NOTE — Anesthesia Preprocedure Evaluation (Addendum)
Anesthesia Evaluation  Patient identified by MRN, date of birth, ID band Patient awake    Reviewed: Allergy & Precautions, NPO status , Patient's Chart, lab work & pertinent test results  Airway Mallampati: II  TM Distance: >3 FB Neck ROM: Full    Dental  (+) Teeth Intact, Dental Advisory Given   Pulmonary    breath sounds clear to auscultation       Cardiovascular hypertension, Pt. on medications and Pt. on home beta blockers + CAD and + CABG  + pacemaker  Rhythm:Regular Rate:Normal     Neuro/Psych Anxiety Depression CVA    GI/Hepatic GERD  Medicated,  Endo/Other  diabetes, Type 2, Insulin Dependent, Oral Hypoglycemic Agents  Renal/GU      Musculoskeletal negative musculoskeletal ROS (+)   Abdominal (+)  Abdomen: tender.    Peds  Hematology negative hematology ROS (+)   Anesthesia Other Findings   Reproductive/Obstetrics                           Lab Results  Component Value Date   WBC 7.8 12/28/2018   HGB 13.6 12/28/2018   HCT 42.1 12/28/2018   MCV 90.3 12/28/2018   PLT 296 12/28/2018   Lab Results  Component Value Date   CREATININE 0.95 12/28/2018   BUN 13 12/28/2018   NA 137 12/28/2018   K 4.6 12/28/2018   CL 102 12/28/2018   CO2 25 12/28/2018   Lab Results  Component Value Date   INR 0.95 12/28/2018   INR 1.21 10/30/2018   INR 1.20 10/29/2018   Echo: - Left ventricle: Systolic function was moderately reduced. The   estimated ejection fraction was in the range of 35% to 40%. - Aortic valve: No evidence of vegetation. - Mitral valve: No evidence of vegetation. There was mild   regurgitation. - Left atrium: No evidence of thrombus in the atrial cavity or   appendage. No evidence of thrombus in the appendage. - Right atrium: No evidence of thrombus in the atrial cavity or   appendage. - Atrial septum: Echo contrast study showed no right-to-left atrial   level  shunt, at baseline or with provocation. - Tricuspid valve: No evidence of vegetation. - Pulmonic valve: No evidence of vegetation. - Superior vena cava: The study excluded a thrombus.  Anesthesia Physical Anesthesia Plan  ASA: III  Anesthesia Plan: General   Post-op Pain Management:    Induction: Intravenous  PONV Risk Score and Plan: 3 and Ondansetron, Dexamethasone and Midazolam  Airway Management Planned: Oral ETT  Additional Equipment: None  Intra-op Plan:   Post-operative Plan: Extubation in OR  Informed Consent: I have reviewed the patients History and Physical, chart, labs and discussed the procedure including the risks, benefits and alternatives for the proposed anesthesia with the patient or authorized representative who has indicated his/her understanding and acceptance.       Plan Discussed with: CRNA  Anesthesia Plan Comments: (See PST note 12/28/18, Jodell CiproJessica Zanetto, PA-C)      Anesthesia Quick Evaluation

## 2018-12-29 NOTE — Telephone Encounter (Signed)
Clearance letter fax twice to [facility] at [phone]. LEtter fax twice and confirmed.

## 2018-12-29 NOTE — Progress Notes (Signed)
Anesthesia Chart Review   Case:  [id number] Date/Time:  01/04/19 0915   Procedure:  LAPAROSCOPIC CHOLECYSTECTOMY WITH INTRAOPERATIVE CHOLANGIOGRAM (N/A )   Anesthesia type:  General   Pre-op diagnosis:  CHRONIC CHOLECYSTITIS   Location:  [place] [facility] / [facility]   Surgeon:  [name], MD      DISCUSSION:47 yo never smoker with h/o PVD,GERD, depression, anxiety, CAD (CABG 11/2017), DM II, CHF, HTN, ILR in place scheduled for above surgery on 01/04/19 with Dr. [name].   S/p 3 vessel CAD seen on cath in 2018 and subsequent CABG 11/2017.  Pt last seen by cardiology on 12/26/18, seen by Dr. Skeet Latch . At this visit pt was doing well, denied chest pain, shortness of breath, LE edema, orthopnea, or PND. Dr. Oval Linsey reports LVEF 35-40%, he is euvolemic, ILR in place with no arrhythmias as of 11/2018.  Previously received clearance on 11/22/18 from Daune Perch, NP who states, "He has had no chest discomfort, shortness of breath, orthopnea.  He had some mild edema which over the last few weeks has been much better.  Therefore, based on ACC/AHA guidelines, the patient would be at acceptable risk for the planned procedure without further cardiovascular testing.  His beta-blocker should be continued perioperatively if tolerated to reduce his cardiac risk. His Plavix is prescribed by neurology, Dr. Leonie Man.  Interruption and Plavix for surgery will have to be approved by Dr. Leonie Man." No changes to pt status since this clearance as documented in OV note from Dr. Oval Linsey on 12/26/18.   Per Dr. Leonie Man on 12/18/18 pt is to hold Plavix 5 days prior to procedure.  During PST visit pt reports he had not been given instructions on when to hold Plavix.  Spoke with Dr. Dois Davenport office, nurse states she will make sure pt is aware of instructions from Dr. Leonie Man.   Pt can proceed with planned procedure barring acute status change.  VS: BP (!) 148/81   Pulse 70   Temp 36.7 C (Oral)   Resp 16   SpO2 100%    PROVIDERS: [place] Bing, DO is PCP   LABS: Labs reviewed: Acceptable for surgery. (all labs ordered are listed, but only abnormal results are displayed)  Labs Reviewed  GLUCOSE, CAPILLARY - Abnormal; Notable for the following components:      Result Value   Glucose-Capillary 112 (*)    All other components within normal limits  HEMOGLOBIN A1C - Abnormal; Notable for the following components:   Hgb A1c MFr Bld 6.9 (*)    All other components within normal limits  COMPREHENSIVE METABOLIC PANEL - Abnormal; Notable for the following components:   Glucose, Bld 122 (*)    All other components within normal limits  APTT  CBC WITH DIFFERENTIAL/PLATELET  PROTIME-INR  TYPE AND SCREEN  ABO/RH     IMAGES: Chest Xray 10/29/18 FINDINGS: Heart remains at the upper limits of normal in size status post CABG. Interval placement of a loop recorder. Normal pulmonary vascularity. No focal consolidation, pleural effusion, or pneumothorax. No acute osseous abnormality.  IMPRESSION: No active disease.  EKG: 11/02/18  Rate 61 bpm Normal sinus rhythm  Anterolateral infarct, age undetermined Abnormal ECG No significant change since last tracing  CV: Stress Test 06/23/18  The left ventricular ejection fraction is moderately decreased (30-44%).  Nuclear stress EF: 44%.  There was no ST segment deviation noted during stress.  No T wave inversion was noted during stress.  Defect 1: There is a large defect  of severe severity present in the mid anterior, apical anterior, apical septal, apical inferior, apical lateral and apex location.  Findings consistent with prior myocardial infarction with peri-infarct ischemia.  This is an intermediate risk study.   Intermediate risk nuclear stress test. Extensive and severe apical scar consistent with previous infarction in the distribution of the mid LAD artery.  There is minimal peri-infarct ischemia in the mid anterior wall. There  are no other areas of significant reversible ischemia. Left ventricular systolic function is mild to moderately depressed and has improved compared with a study from 2017.  The perfusion abnormalities are similar. Left ventricular systolic function is very similar to that reported on cardiac MRI from 2018.  Echo 06/28/18 Study Conclusions  - Left ventricle: Systolic function was moderately reduced. The   estimated ejection fraction was in the range of 35% to 40%. - Aortic valve: No evidence of vegetation. - Mitral valve: No evidence of vegetation. There was mild   regurgitation. - Left atrium: No evidence of thrombus in the atrial cavity or   appendage. No evidence of thrombus in the appendage. - Right atrium: No evidence of thrombus in the atrial cavity or   appendage. - Atrial septum: Echo contrast study showed no right-to-left atrial   level shunt, at baseline or with provocation. - Tricuspid valve: No evidence of vegetation. - Pulmonic valve: No evidence of vegetation. - Superior vena cava: The study excluded a thrombus.  Impressions:  - No cardiac source of emboli was indentified.  Cardiac Cath 10/19/2017 (Subsequent CABG 11/29/17) 1. Severe 3 vessel obstructive CAD.    - 100% proximal LAD with left to left and right to left collaterals.    - 60-70% very large OM1    - 60% mid RCA, Long 80% distal RCA extending down into the PDA 2. Normal LVEDP 3. Normal right heart pressures and cardiac output. Past Medical History:  Diagnosis Date  . Acute on chronic systolic and diastolic heart failure, NYHA class 3 ([place]) 09/14/2017  . Acute osteomyelitis of metatarsal bone of left foot ([place]) 11/12/2015  . CAD in native artery 09/14/2017  . Closed fracture of right tibial plateau 11/11/2015  . Depression with anxiety   . Diabetes mellitus    INSULIN DEPENDENT Type II  . Diabetic peripheral neuropathy associated with type 2 diabetes mellitus ([place]) 08/30/2008   Qualifier: Diagnosis of   By: [name] Done FNP, Tori Milks    . Diabetic ulcer of left foot ([place]) 11/14/2015  . Gastroparesis   . GERD (gastroesophageal reflux disease)   . Hypertension   . Left ventricular aneurysm    REPORTS HE IS NOT AWAREW OF THIS   . Osteomyelitis ([place])   . Osteoporosis 11/12/2015  . Peripheral neuropathy   . Shortness of breath dyspnea    with exertion  . Vitamin D deficiency 11/12/2015    Past Surgical History:  Procedure Laterality Date  . AMPUTATION Left 11/28/2015   Procedure: Left 4th Ray Amputation;  Surgeon: [name], MD;  Location: [facility] [facility];  Service: Orthopedics;  Laterality: Left;  . AMPUTATION Left 10/29/2016   Procedure: LEFT 5TH RAY AMPUTATION;  Surgeon: [name], MD;  Location: [facility];  Service: Orthopedics;  Laterality: Left;  . ANTERIOR VITRECTOMY Left 04/12/2016   Procedure: ANTERIOR CHAMBER Orange OUT WITH GAS FLUID EXCHANGE;  Surgeon: [name], MD;  Location: [facility];  Service: Ophthalmology;  Laterality: Left;  . CARDIAC CATHETERIZATION    . CATARACT EXTRACTION Left   . CORONARY ARTERY BYPASS  GRAFT N/A 11/29/2017    LIMA-LAD, SVG-PDA, SVG-CFX  Procedure: CORONARY ARTERY BYPASS GRAFTING (CABG) times three using the left saphaneous vien. harvested endoscopicly and left internal mammary artery.;  Surgeon: [name], MD;  Location: [place];  Service: Open Heart Surgery;  Laterality: N/A;  . EYE SURGERY     left eye surgery 04/2016  . IR CHOLANGIOGRAM EXISTING TUBE  10/17/2018  . IR CHOLANGIOGRAM EXISTING TUBE  10/30/2018  . IR PERC CHOLECYSTOSTOMY  09/05/2018  . KNEE SURGERY Left   . LOOP RECORDER INSERTION N/A 06/28/2018   Procedure: LOOP RECORDER INSERTION;  Surgeon: Deboraha Sprang, MD;  Location: [facility];  Service: Cardiovascular;  Laterality: N/A;  . RIGHT/LEFT HEART CATH AND CORONARY ANGIOGRAPHY N/A 10/19/2017   Procedure: RIGHT/LEFT HEART CATH AND CORONARY ANGIOGRAPHY;  Surgeon: [name], [name], MD;  Location: [facility];  Service:  Cardiovascular;  Laterality: N/A;  . SHOULDER SURGERY Right   . TEE WITHOUT CARDIOVERSION N/A 11/29/2017   Procedure: TRANSESOPHAGEAL ECHOCARDIOGRAM (TEE);  Surgeon: [name], Collier Salina, MD;  Location: [facility];  Service: Open Heart Surgery;  Laterality: N/A;  . TEE WITHOUT CARDIOVERSION N/A 06/28/2018   Procedure: TRANSESOPHAGEAL ECHOCARDIOGRAM (TEE);  Surgeon: [name], MD;  Location: [facility];  Service: Cardiovascular;  Laterality: N/A;  . TOE AMPUTATION Left 11/28/15   4th toe    MEDICATIONS: . aspirin EC 325 MG EC tablet  . Blood Glucose Monitoring Suppl (ONETOUCH VERIO) w/Device KIT  . clopidogrel (PLAVIX) 75 MG tablet  . Ferrous Sulfate (IRON) 325 (65 Fe) MG TABS  . furosemide (LASIX) 20 MG tablet  . glucose blood (ONETOUCH VERIO) test strip  . insulin detemir (LEVEMIR) 100 UNIT/ML injection  . lansoprazole (PREVACID) 30 MG capsule  . losartan (COZAAR) 25 MG tablet  . metFORMIN (GLUCOPHAGE XR) 750 MG 24 hr tablet  . metoCLOPramide (REGLAN) 10 MG tablet  . metoprolol succinate (TOPROL-XL) 25 MG 24 hr tablet  . ondansetron (ZOFRAN) 4 MG tablet  . ONETOUCH DELICA LANCETS 73P MISC  . rosuvastatin (CRESTOR) 40 MG tablet  . traZODone (DESYREL) 50 MG tablet   No current facility-administered medications for this encounter.     Maia [facility] Pre-Surgical Testing  [phone] 12/29/18 4:00 PM

## 2019-01-03 ENCOUNTER — Ambulatory Visit: Payer: Self-pay | Admitting: General Surgery

## 2019-01-03 NOTE — Progress Notes (Signed)
Neuro clearance letter on chart , Shirley Friar, NP 12-28-2018

## 2019-01-03 NOTE — H&P (Signed)
Dorris Singh Documented: 11/16/2018 8:38 AM Location: [facility] Patient #: [id number] DOB: 01/10/1971 Single / Language: Cleophus Molt / Race: White Male   History of Present Illness Randall Hiss M. Adelyne Marchese MD; 11/16/2018 8:59 AM) The patient is a 48 year old male who presents for evaluation of gallbladder disease. He comes in for long-term follow-up regarding his gallbladder issues. I last saw him in the clinic in May. Unfortunately he had multiple strokes in July. Etiology still unknown. He was still symptomatic with postprandial and right upper quadrant pain and nausea. He underwent cholecystostomy tube placement on September 24. He had a follow-up study which showed patent biliary system. He is accompanied by his aunt today. He was admitted to the hospital from November 17 until the 21st with an elevated bilirubin of 5.7 which decreased to 2 on discharge. He went to ER with complaints of nausea vomiting and sweating. He had a CT scan which was unremarkable except for a questionable kidney lesion. They did a cholecystostomy tube interrogation and his bile duct system was clear. No common bile duct stone. His blood cultures were negative. He states that he is still having upper GI issues. Still having poor appetite at times and nausea. He is asking for a new bag with tubing connect to his cholecystostomy drain. He saw neurology in August and they recommended waiting 6 months prior to cholecystectomy. He did see cardiology in October but I don't have official clearance just yet. He denies any amaurosis fugax or TIAs. He still has ongoing slurred speech at times. He denies any chest pain or chest pressure or shortness of breath.  04/2018 He comes in for long-term follow-up. I initially met him late last summer for episodic nausea, vomiting and upper abdominal discomfort. We found that he had an abnormal stress test and referred him to cardiology. He ultimately ended up getting a  CABG in December 2018. He is still waiting on referral to cardiac rehabilitation. He states cities had 2 gallbladder attacks since December 1 of which landed him in the hospital with nausea and vomiting. He states his attacks mainly are nausea and vomiting for several days where it is difficult to eat and drink and he has decreased energy and more weakness. He complains of burning in his upper abdomen. He hasn't been too active since his surgery because he has balance issues. He fell a few weeks ago and hurt his left side. He is on a full strength aspirin. He denies any chest pressure or chest tightness, shortness of breath, orthopnea, paroxysmal nocturnal dyspnea. He does occasionally have some peripheral edema.    08/10/2017: He is referred by Deliah Goody for evaluation of gallstones and biliary dyskinesia. He is accompanied by his wife. He reports multiple episodes of nausea and vomiting along with upper abdominal discomfort. This all occurred about a month ago to 6 weeks ago. He states that he is much better. He ended up in the emergency room twice. He underwent CT imaging, abdominal ultrasound, nuclear medicine scan throughout his workup. He was found to have gallstones and sludge. He was also found to have a gallbladder ejection fraction of around 22%. He did have a remote gastric emptying study that showed some mild gastroparesis. He is a long-standing insulin-dependent diabetic. However his diabetes is under excellent control. He states his last A1c was around 5.9. He does not smoke. He was wife state that he lost a fair amount of weight when he was sick but is gaining weight back. He  denies any chest pain, chest pressure, shortness of breath but does endorse some dyspnea on exertion. When he was having his abdominal symptoms and GI symptoms there is nothing that alleviated his symptoms. It occurred with and without food intake   Problem List/Past Medical Randall Hiss M. Redmond Pulling,  MD; 11/16/2018 9:01 AM) Veatrice Bourbon CARE (Z43.4)  NAUSEA AND VOMITING (R11.2)  SYMPTOMATIC CHOLELITHIASIS (K80.20)  HISTORY OF CVA IN ADULTHOOD (H21.22)  GASTROPARESIS (K31.84)  BILIARY DYSKINESIA (K82.8)  S/P CABG (CORONARY ARTERY BYPASS GRAFT) (Z95.1)   Past Surgical History Randall Hiss M. Redmond Pulling, MD; 11/16/2018 9:01 AM) Cataract Surgery  Left. Foot Surgery  Left. Knee Surgery  Left. Shoulder Surgery  Right.  Diagnostic Studies History Randall Hiss M. Redmond Pulling, MD; 11/16/2018 9:01 AM) Colonoscopy  never  Allergies (Tanisha A. Owens Shark, [place]; 11/16/2018 8:38 AM) No Known Drug Allergies [11/16/2018]: Allergies Reconciled   Medication History (Tanisha A. Owens Shark, [place]; 11/16/2018 8:39 AM) Erythromycin Base (250MG Tablet, Oral) Active. Metoprolol Succinate ER (25MG Tablet ER 24HR, Oral) Active. Losartan Potassium (25MG Tablet, Oral) Active. Aspirin (325MG Tablet, Oral) Active. Ferrous Sulfate (325 (65 Fe)MG Tablet, Oral) Active. Gabapentin (800MG Tablet, Oral) Active. Levemir FlexTouch (100UNIT/ML Soln Pen-inj, Subcutaneous) Active. Ondansetron (8MG Tablet Disint, Oral) Active. Promethazine HCl (25MG Tablet, Oral) Active. RaNITidine HCl (150MG Tablet, Oral) Active. Reglan (10MG Tablet, Oral) Active. metFORMIN HCl ER (750MG Tablet ER 24HR, Oral) Active. Clopidogrel Bisulfate (75MG Tablet, Oral) Active. Ondansetron HCl (4MG Tablet, Oral) Active. Medications Reconciled  Social History Randall Hiss M. Redmond Pulling, MD; 11/16/2018 9:01 AM) No alcohol use  No caffeine use  No drug use  Tobacco use  Never smoker.  Family History Randall Hiss M. Redmond Pulling, MD; 11/16/2018 9:01 AM) Arthritis  Sister. Cancer  Father. Depression  Mother, Sister. Diabetes Mellitus  Sister. Heart Disease  Mother. Heart disease in male family member before age 69  Hypertension  Father. Malignant Neoplasm Of Pancreas  Family Members In General. Migraine Headache  Sister. Respiratory Condition   Mother.  Other Problems Randall Hiss M. 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 Jacquelene Kopecky MD; 11/16/2018 8:59 AM) All other systems negative  Vitals (Tanisha A. Brown RMA; 11/16/2018 8:38 AM) 11/16/2018 8:38 AM Weight: 214.2 lb Height: 74in Body Surface Area: 2.24 m Body Mass Index: 27.5 kg/m  Temp.: 98.58F  Pulse: 95 (Regular)  BP: 128/82 (Sitting, Left Arm, Standard)       Physical Exam Randall Hiss M. Fizza Scales MD; 11/16/2018 8:59 AM) General Mental Status-Alert. General Appearance-Consistent with stated age. Hydration-Well hydrated. Voice-Normal. Note: overweight   Head and Neck Head-normocephalic, atraumatic with no lesions or palpable masses. Trachea-midline. Thyroid Gland Characteristics - normal size and consistency.  Eye Eyeball - Bilateral-Extraocular movements intact. Sclera/Conjunctiva - Bilateral-No scleral icterus.  ENMT Note: ears - normal external ears mouth - lips not dry   Chest and Lung Exam Chest and lung exam reveals -quiet, even and easy respiratory effort with no use of accessory muscles and on auscultation, normal breath sounds, no adventitious sounds and normal vocal resonance. Inspection Chest Wall - Normal. Back - normal.  Breast - Did not examine.  Cardiovascular Cardiovascular examination reveals -normal heart sounds, regular rate and rhythm with no murmurs and normal pedal pulses bilaterally. Note: Old sternotomy incision   Abdomen Inspection Inspection of the abdomen reveals - No Hernias. Note: redundant skin. Skin - Scar - no surgical scars. Palpation/Percussion Palpation and Percussion of the  abdomen reveal - Soft, Non Tender, No Rebound tenderness, No Rigidity (guarding) and No hepatosplenomegaly. Auscultation Auscultation of  the abdomen reveals - Bowel sounds normal. Note: Gb draiin in place. bile in bag. drain site ok   Peripheral Vascular Upper Extremity Palpation - Pulses bilaterally normal.  Neurologic Neurologic evaluation reveals -alert and oriented x 3 with no impairment of recent or remote memory. Mental Status-Normal.  Neuropsychiatric The patient's mood and affect are described as -normal. Judgment and Insight-insight is appropriate concerning matters relevant to self.  Musculoskeletal Normal Exam - Left-Upper Extremity Strength Normal and Lower Extremity Strength Normal. Normal Exam - Right-Upper Extremity Strength Normal and Lower Extremity Strength Normal.  Lymphatic Head & Neck  General Head & Neck Lymphatics: Bilateral - Description - Normal. Axillary - Did not examine. Femoral & Inguinal - Did not examine.    Assessment & Plan Randall Hiss M. Vertis Scheib MD; 11/16/2018 9:01 AM) BILIARY DYSKINESIA (K82.8) Impression: I believe most of the patient's symptoms are consistent with gallbladder disease but not all.  We rediscussed gallbladder disease. The patient was given Neurosurgeon. We discussed non-operative and operative management. We discussed the signs & symptoms of acute cholecystitis  I discussed laparoscopic cholecystectomy with possible IOC in detail. The patient was given educational material as well as diagrams detailing the procedure. We discussed the risks and benefits of a laparoscopic cholecystectomy including, but not limited to bleeding, infection, injury to surrounding structures such as the intestine or liver, bile leak, retained gallstones, need to convert to an open procedure, prolonged diarrhea, blood clots such as DVT, common bile duct injury, anesthesia risks, and possible need for additional procedures. We discussed the typical post-operative recovery course. I explained that the likelihood of improvement of their symptoms is fair to good. We did discuss  the possibility that cholecystectomy may not ameliorate all his symptoms.  The patient has elected to proceed with surgery pending cardiac clearance Current Plans Pt Education - Pamphlet Given - Laparoscopic Gallbladder Surgery: discussed with patient and provided information. I recommended obtaining preoperative cardiac clearance. I am concerned about the health of the patient and the ability to tolerate the operation. Therefore, we will request clearance by cardiology to better assess operative risk & see if a reevaluation, further workup, etc is needed. Also recommendations on how medications such as for anticoagulation and blood pressure should be managed/held/restarted after surgery.   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 Therefore we will plan for surgery in January pending cardiology clearance. He will need to hold his Plavix perioperatively but can continue his aspirin GASTROPARESIS (K31.84) Impression: As I explained again to him and his and it is a possibility that cholecystectomy may not ameliorate all of his GI issues. He states he understands this. I explained why they placed him on erythromycin.  Leighton Ruff. Redmond Pulling, MD, FACS General, Bariatric, & Minimally Invasive Surgery [facility], [place]

## 2019-01-04 ENCOUNTER — Ambulatory Visit (HOSPITAL_COMMUNITY): Payer: Medicare Other | Admitting: Physician Assistant

## 2019-01-04 ENCOUNTER — Encounter (HOSPITAL_COMMUNITY): Payer: Self-pay | Admitting: *Deleted

## 2019-01-04 ENCOUNTER — Ambulatory Visit (HOSPITAL_COMMUNITY): Payer: Medicare Other | Admitting: Certified Registered Nurse Anesthetist

## 2019-01-04 ENCOUNTER — Other Ambulatory Visit: Payer: Self-pay

## 2019-01-04 ENCOUNTER — Observation Stay (HOSPITAL_COMMUNITY)
Admission: RE | Admit: 2019-01-04 | Discharge: 2019-01-05 | Disposition: A | Discharge status: Home / Self Care | Payer: Medicare Other | Source: Other Acute Inpatient Hospital | Attending: General Surgery | Admitting: General Surgery

## 2019-01-04 ENCOUNTER — Encounter (HOSPITAL_COMMUNITY)
Admission: RE | Disposition: A | Discharge status: Home / Self Care | Payer: Self-pay | Source: Other Acute Inpatient Hospital | Attending: General Surgery

## 2019-01-04 DIAGNOSIS — I110 Hypertensive heart disease with heart failure: Secondary | ICD-10-CM | POA: Insufficient documentation

## 2019-01-04 DIAGNOSIS — E1143 Type 2 diabetes mellitus with diabetic autonomic (poly)neuropathy: Secondary | ICD-10-CM | POA: Diagnosis not present

## 2019-01-04 DIAGNOSIS — I10 Essential (primary) hypertension: Secondary | ICD-10-CM | POA: Insufficient documentation

## 2019-01-04 DIAGNOSIS — Z7982 Long term (current) use of aspirin: Secondary | ICD-10-CM | POA: Insufficient documentation

## 2019-01-04 DIAGNOSIS — Z9049 Acquired absence of other specified parts of digestive tract: Secondary | ICD-10-CM

## 2019-01-04 DIAGNOSIS — Z79899 Other long term (current) drug therapy: Secondary | ICD-10-CM | POA: Diagnosis not present

## 2019-01-04 DIAGNOSIS — Z950 Presence of cardiac pacemaker: Secondary | ICD-10-CM | POA: Diagnosis not present

## 2019-01-04 DIAGNOSIS — F418 Other specified anxiety disorders: Secondary | ICD-10-CM | POA: Insufficient documentation

## 2019-01-04 DIAGNOSIS — Z7902 Long term (current) use of antithrombotics/antiplatelets: Secondary | ICD-10-CM | POA: Diagnosis not present

## 2019-01-04 DIAGNOSIS — E1142 Type 2 diabetes mellitus with diabetic polyneuropathy: Secondary | ICD-10-CM | POA: Diagnosis not present

## 2019-01-04 DIAGNOSIS — Z794 Long term (current) use of insulin: Secondary | ICD-10-CM | POA: Diagnosis not present

## 2019-01-04 DIAGNOSIS — K3184 Gastroparesis: Secondary | ICD-10-CM | POA: Diagnosis not present

## 2019-01-04 DIAGNOSIS — K8010 Calculus of gallbladder with chronic cholecystitis without obstruction: Principal | ICD-10-CM | POA: Insufficient documentation

## 2019-01-04 DIAGNOSIS — Z951 Presence of aortocoronary bypass graft: Secondary | ICD-10-CM | POA: Diagnosis not present

## 2019-01-04 DIAGNOSIS — K219 Gastro-esophageal reflux disease without esophagitis: Secondary | ICD-10-CM | POA: Diagnosis not present

## 2019-01-04 DIAGNOSIS — I5042 Chronic combined systolic (congestive) and diastolic (congestive) heart failure: Secondary | ICD-10-CM | POA: Diagnosis not present

## 2019-01-04 DIAGNOSIS — I2510 Atherosclerotic heart disease of native coronary artery without angina pectoris: Secondary | ICD-10-CM | POA: Diagnosis not present

## 2019-01-04 DIAGNOSIS — Z8673 Personal history of transient ischemic attack (TIA), and cerebral infarction without residual deficits: Secondary | ICD-10-CM | POA: Diagnosis not present

## 2019-01-04 DIAGNOSIS — Z89422 Acquired absence of other left toe(s): Secondary | ICD-10-CM | POA: Diagnosis not present

## 2019-01-04 HISTORY — PX: CHOLECYSTECTOMY: SHX55

## 2019-01-04 LAB — TYPE AND SCREEN
ABO/RH(D): A NEG
Antibody Screen: NEGATIVE

## 2019-01-04 LAB — GLUCOSE, CAPILLARY
Glucose-Capillary: 147 mg/dL — ABNORMAL HIGH (ref 70–99)
Glucose-Capillary: 179 mg/dL — ABNORMAL HIGH (ref 70–99)
Glucose-Capillary: 144 mg/dL — ABNORMAL HIGH (ref 70–99)
Glucose-Capillary: 123 mg/dL — ABNORMAL HIGH (ref 70–99)

## 2019-01-04 SURGERY — LAPAROSCOPIC CHOLECYSTECTOMY WITH INTRAOPERATIVE CHOLANGIOGRAM
Anesthesia: General | Site: Abdomen

## 2019-01-04 MED ORDER — ACETAMINOPHEN 325 MG PO TABS: 325.0000 mg | ORAL_TABLET | Freq: Once | ORAL | Status: DC

## 2019-01-04 MED ORDER — INSULIN ASPART 100 UNIT/ML ~~LOC~~ SOLN
0.0000 [IU] | Freq: Three times a day (TID) | SUBCUTANEOUS | Status: DC
  Administered 2019-01-04: 2 [IU] via SUBCUTANEOUS

## 2019-01-04 MED ORDER — SUGAMMADEX SODIUM 200 MG/2ML IV SOLN
INTRAVENOUS | Status: AC
  Filled 2019-01-04: qty 2

## 2019-01-04 MED ORDER — DEXAMETHASONE SODIUM PHOSPHATE 10 MG/ML IJ SOLN
INTRAMUSCULAR | Status: DC | PRN
  Administered 2019-01-04: 8 mg via INTRAVENOUS

## 2019-01-04 MED ORDER — ACETAMINOPHEN 500 MG PO TABS
1000.0000 mg | ORAL_TABLET | Freq: Four times a day (QID) | ORAL | Status: DC
  Administered 2019-01-04 – 2019-01-05 (×3): 1000 mg via ORAL
  Filled 2019-01-04 (×4): qty 2

## 2019-01-04 MED ORDER — 0.9 % SODIUM CHLORIDE (POUR BTL) OPTIME
TOPICAL | Status: DC | PRN
  Administered 2019-01-04: 1000 mL

## 2019-01-04 MED ORDER — PHENYLEPHRINE HCL-NACL 10-0.9 MG/250ML-% IV SOLN
INTRAVENOUS | Status: AC
  Filled 2019-01-04: qty 250

## 2019-01-04 MED ORDER — PROMETHAZINE HCL 25 MG/ML IJ SOLN
INTRAMUSCULAR | Status: AC
  Filled 2019-01-04: qty 1

## 2019-01-04 MED ORDER — GABAPENTIN 300 MG PO CAPS
300.0000 mg | ORAL_CAPSULE | ORAL | Status: AC
  Administered 2019-01-04: 300 mg via ORAL
  Filled 2019-01-04: qty 1

## 2019-01-04 MED ORDER — BUPIVACAINE-EPINEPHRINE (PF) 0.25% -1:200000 IJ SOLN
INTRAMUSCULAR | Status: AC
  Filled 2019-01-04: qty 30

## 2019-01-04 MED ORDER — PHENYLEPHRINE 40 MCG/ML (10ML) SYRINGE FOR IV PUSH (FOR BLOOD PRESSURE SUPPORT)
PREFILLED_SYRINGE | INTRAVENOUS | Status: AC
  Filled 2019-01-04: qty 10

## 2019-01-04 MED ORDER — ROCURONIUM BROMIDE 50 MG/5ML IV SOSY
PREFILLED_SYRINGE | INTRAVENOUS | Status: DC | PRN
  Administered 2019-01-04: 50 mg via INTRAVENOUS

## 2019-01-04 MED ORDER — FENTANYL CITRATE (PF) 250 MCG/5ML IJ SOLN
INTRAMUSCULAR | Status: AC
  Filled 2019-01-04: qty 5

## 2019-01-04 MED ORDER — BUPIVACAINE-EPINEPHRINE 0.25% -1:200000 IJ SOLN
INTRAMUSCULAR | Status: DC | PRN
  Administered 2019-01-04: 30 mL

## 2019-01-04 MED ORDER — ESMOLOL HCL 100 MG/10ML IV SOLN
INTRAVENOUS | Status: AC
  Filled 2019-01-04: qty 10

## 2019-01-04 MED ORDER — ACETAMINOPHEN 500 MG PO TABS
1000.0000 mg | ORAL_TABLET | ORAL | Status: AC
  Administered 2019-01-04: 1000 mg via ORAL
  Filled 2019-01-04: qty 2

## 2019-01-04 MED ORDER — SUCCINYLCHOLINE CHLORIDE 200 MG/10ML IV SOSY
PREFILLED_SYRINGE | INTRAVENOUS | Status: AC
  Filled 2019-01-04: qty 10

## 2019-01-04 MED ORDER — MIDAZOLAM HCL 5 MG/5ML IJ SOLN
INTRAMUSCULAR | Status: DC | PRN
  Administered 2019-01-04: 2 mg via INTRAVENOUS

## 2019-01-04 MED ORDER — ROCURONIUM BROMIDE 100 MG/10ML IV SOLN
INTRAVENOUS | Status: AC
  Filled 2019-01-04: qty 1

## 2019-01-04 MED ORDER — MORPHINE SULFATE (PF) 2 MG/ML IV SOLN
1.0000 mg | INTRAVENOUS | Status: DC | PRN
  Administered 2019-01-04: 2 mg via INTRAVENOUS
  Filled 2019-01-04: qty 1

## 2019-01-04 MED ORDER — FENTANYL CITRATE (PF) 100 MCG/2ML IJ SOLN: 25.0000 ug | INTRAMUSCULAR | Status: DC | PRN

## 2019-01-04 MED ORDER — PROPOFOL 10 MG/ML IV BOLUS
INTRAVENOUS | Status: DC | PRN
  Administered 2019-01-04: 100 mg via INTRAVENOUS

## 2019-01-04 MED ORDER — KETAMINE HCL 10 MG/ML IJ SOLN
INTRAMUSCULAR | Status: DC | PRN
  Administered 2019-01-04: 40 mg via INTRAVENOUS

## 2019-01-04 MED ORDER — LIDOCAINE 2% (20 MG/ML) 5 ML SYRINGE
INTRAMUSCULAR | Status: AC
  Filled 2019-01-04: qty 5

## 2019-01-04 MED ORDER — LACTATED RINGERS IR SOLN
Status: DC | PRN
  Administered 2019-01-04: 1000 mL

## 2019-01-04 MED ORDER — EPHEDRINE SULFATE-NACL 50-0.9 MG/10ML-% IV SOSY
PREFILLED_SYRINGE | INTRAVENOUS | Status: DC | PRN
  Administered 2019-01-04 (×2): 5 mg via INTRAVENOUS

## 2019-01-04 MED ORDER — ONDANSETRON HCL 4 MG/2ML IJ SOLN
INTRAMUSCULAR | Status: DC | PRN
  Administered 2019-01-04: 4 mg via INTRAVENOUS

## 2019-01-04 MED ORDER — ESMOLOL HCL 100 MG/10ML IV SOLN
INTRAVENOUS | Status: DC | PRN
  Administered 2019-01-04 (×2): 10 mg via INTRAVENOUS

## 2019-01-04 MED ORDER — LACTATED RINGERS IV SOLN: INTRAVENOUS | Status: DC

## 2019-01-04 MED ORDER — LACTATED RINGERS IV SOLN
INTRAVENOUS | Status: DC
  Administered 2019-01-04: 08:00:00 via INTRAVENOUS

## 2019-01-04 MED ORDER — FUROSEMIDE 20 MG PO TABS
20.0000 mg | ORAL_TABLET | Freq: Every day | ORAL | Status: DC
  Administered 2019-01-05: 20 mg via ORAL
  Filled 2019-01-04: qty 1

## 2019-01-04 MED ORDER — ONDANSETRON HCL 4 MG/2ML IJ SOLN
4.0000 mg | Freq: Four times a day (QID) | INTRAMUSCULAR | Status: DC | PRN
  Administered 2019-01-04: 4 mg via INTRAVENOUS
  Filled 2019-01-04 (×2): qty 2

## 2019-01-04 MED ORDER — KETOROLAC TROMETHAMINE 30 MG/ML IJ SOLN
INTRAMUSCULAR | Status: AC
  Filled 2019-01-04: qty 1

## 2019-01-04 MED ORDER — OXYCODONE HCL 5 MG PO TABS: 5.0000 mg | ORAL_TABLET | ORAL | Status: DC | PRN

## 2019-01-04 MED ORDER — SODIUM CHLORIDE 0.9 % IV SOLN
2.0000 g | INTRAVENOUS | Status: AC
  Administered 2019-01-04: 2 g via INTRAVENOUS
  Filled 2019-01-04: qty 2

## 2019-01-04 MED ORDER — LIDOCAINE 2% (20 MG/ML) 5 ML SYRINGE
INTRAMUSCULAR | Status: DC | PRN
  Administered 2019-01-04: 100 mg via INTRAVENOUS

## 2019-01-04 MED ORDER — METOPROLOL SUCCINATE ER 25 MG PO TB24
12.5000 mg | ORAL_TABLET | Freq: Every day | ORAL | Status: DC
  Administered 2019-01-04 – 2019-01-05 (×2): 12.5 mg via ORAL
  Filled 2019-01-04 (×2): qty 1

## 2019-01-04 MED ORDER — MIDAZOLAM HCL 2 MG/2ML IJ SOLN
INTRAMUSCULAR | Status: AC
  Filled 2019-01-04: qty 2

## 2019-01-04 MED ORDER — SIMETHICONE 80 MG PO CHEW: 40.0000 mg | CHEWABLE_TABLET | Freq: Four times a day (QID) | ORAL | Status: DC | PRN

## 2019-01-04 MED ORDER — PHENYLEPHRINE 40 MCG/ML (10ML) SYRINGE FOR IV PUSH (FOR BLOOD PRESSURE SUPPORT)
PREFILLED_SYRINGE | INTRAVENOUS | Status: DC | PRN
  Administered 2019-01-04 (×2): 40 ug via INTRAVENOUS

## 2019-01-04 MED ORDER — SODIUM CHLORIDE 0.9 % IV SOLN
INTRAVENOUS | Status: DC
  Administered 2019-01-04: 13:00:00 via INTRAVENOUS

## 2019-01-04 MED ORDER — ACETAMINOPHEN 10 MG/ML IV SOLN: 1000.0000 mg | Freq: Once | INTRAVENOUS | Status: DC | PRN

## 2019-01-04 MED ORDER — SUCCINYLCHOLINE CHLORIDE 200 MG/10ML IV SOSY
PREFILLED_SYRINGE | INTRAVENOUS | Status: DC | PRN
  Administered 2019-01-04: 140 mg via INTRAVENOUS

## 2019-01-04 MED ORDER — CHLORHEXIDINE GLUCONATE CLOTH 2 % EX PADS: 6.0000 | MEDICATED_PAD | Freq: Once | CUTANEOUS | Status: DC

## 2019-01-04 MED ORDER — DIPHENHYDRAMINE HCL 50 MG/ML IJ SOLN: 12.5000 mg | Freq: Four times a day (QID) | INTRAMUSCULAR | Status: DC | PRN

## 2019-01-04 MED ORDER — ENOXAPARIN SODIUM 40 MG/0.4ML ~~LOC~~ SOLN
40.0000 mg | SUBCUTANEOUS | Status: DC
  Administered 2019-01-05: 40 mg via SUBCUTANEOUS
  Filled 2019-01-04: qty 0.4

## 2019-01-04 MED ORDER — EPHEDRINE 5 MG/ML INJ
INTRAVENOUS | Status: AC
  Filled 2019-01-04: qty 10

## 2019-01-04 MED ORDER — LOSARTAN POTASSIUM 25 MG PO TABS
12.5000 mg | ORAL_TABLET | Freq: Every day | ORAL | Status: DC
  Administered 2019-01-04 – 2019-01-05 (×2): 12.5 mg via ORAL
  Filled 2019-01-04 (×2): qty 1

## 2019-01-04 MED ORDER — KETOROLAC TROMETHAMINE 30 MG/ML IJ SOLN
INTRAMUSCULAR | Status: DC | PRN
  Administered 2019-01-04: 30 mg via INTRAVENOUS

## 2019-01-04 MED ORDER — FENTANYL CITRATE (PF) 250 MCG/5ML IJ SOLN
INTRAMUSCULAR | Status: DC | PRN
  Administered 2019-01-04: 50 ug via INTRAVENOUS
  Administered 2019-01-04: 100 ug via INTRAVENOUS
  Administered 2019-01-04: 50 ug via INTRAVENOUS

## 2019-01-04 MED ORDER — PROPOFOL 10 MG/ML IV BOLUS
INTRAVENOUS | Status: AC
  Filled 2019-01-04: qty 20

## 2019-01-04 MED ORDER — DEXAMETHASONE SODIUM PHOSPHATE 10 MG/ML IJ SOLN
INTRAMUSCULAR | Status: AC
  Filled 2019-01-04: qty 1

## 2019-01-04 MED ORDER — ACETAMINOPHEN 160 MG/5ML PO SOLN: 325.0000 mg | Freq: Once | ORAL | Status: DC

## 2019-01-04 MED ORDER — PANTOPRAZOLE SODIUM 40 MG IV SOLR
40.0000 mg | Freq: Every day | INTRAVENOUS | Status: DC
  Administered 2019-01-04: 40 mg via INTRAVENOUS
  Filled 2019-01-04: qty 40

## 2019-01-04 MED ORDER — MEPERIDINE HCL 50 MG/ML IJ SOLN: 6.2500 mg | INTRAMUSCULAR | Status: DC | PRN

## 2019-01-04 MED ORDER — PROMETHAZINE HCL 25 MG/ML IJ SOLN
6.2500 mg | INTRAMUSCULAR | Status: DC | PRN
  Administered 2019-01-04: 12.5 mg via INTRAVENOUS

## 2019-01-04 MED ORDER — ONDANSETRON 4 MG PO TBDP: 4.0000 mg | ORAL_TABLET | Freq: Four times a day (QID) | ORAL | Status: DC | PRN

## 2019-01-04 MED ORDER — HEPARIN SODIUM (PORCINE) 5000 UNIT/ML IJ SOLN
5000.0000 [IU] | Freq: Once | INTRAMUSCULAR | Status: AC
  Administered 2019-01-04: 5000 [IU] via SUBCUTANEOUS
  Filled 2019-01-04: qty 1

## 2019-01-04 MED ORDER — PHENYLEPHRINE HCL 10 MG/ML IJ SOLN
INTRAVENOUS | Status: DC | PRN
  Administered 2019-01-04: 30 ug/min via INTRAVENOUS

## 2019-01-04 MED ORDER — ONDANSETRON HCL 4 MG/2ML IJ SOLN
INTRAMUSCULAR | Status: AC
  Filled 2019-01-04: qty 2

## 2019-01-04 MED ORDER — DIPHENHYDRAMINE HCL 12.5 MG/5ML PO ELIX: 12.5000 mg | ORAL_SOLUTION | Freq: Four times a day (QID) | ORAL | Status: DC | PRN

## 2019-01-04 MED ORDER — GABAPENTIN 100 MG PO CAPS
200.0000 mg | ORAL_CAPSULE | Freq: Two times a day (BID) | ORAL | Status: DC
  Administered 2019-01-04 – 2019-01-05 (×3): 200 mg via ORAL
  Filled 2019-01-04 (×3): qty 2

## 2019-01-04 MED ORDER — LIDOCAINE HCL 2 % IJ SOLN
INTRAMUSCULAR | Status: AC
  Filled 2019-01-04: qty 20

## 2019-01-04 MED ORDER — LIDOCAINE 2% (20 MG/ML) 5 ML SYRINGE
INTRAMUSCULAR | Status: DC | PRN
  Administered 2019-01-04: 1.5 mg/kg/h via INTRAVENOUS

## 2019-01-04 MED ORDER — ASPIRIN EC 325 MG PO TBEC
325.0000 mg | DELAYED_RELEASE_TABLET | Freq: Every day | ORAL | Status: DC
  Administered 2019-01-04: 325 mg via ORAL
  Filled 2019-01-04: qty 1

## 2019-01-04 SURGICAL SUPPLY — 55 items
APPLICATOR ARISTA FLEXITIP XL (MISCELLANEOUS) IMPLANT
APPLIER CLIP 5 13 M/L LIGAMAX5 (MISCELLANEOUS) ×2
APPLIER CLIP ROT 10 11.4 M/L (STAPLE)
BANDAGE ADH SHEER 1  50/CT (GAUZE/BANDAGES/DRESSINGS) ×8 IMPLANT
BENZOIN TINCTURE PRP APPL 2/3 (GAUZE/BANDAGES/DRESSINGS) ×2 IMPLANT
CABLE HIGH FREQUENCY MONO STRZ (ELECTRODE) ×2 IMPLANT
CHLORAPREP W/TINT 26ML (MISCELLANEOUS) ×2 IMPLANT
CLIP APPLIE 5 13 M/L LIGAMAX5 (MISCELLANEOUS) ×1 IMPLANT
CLIP APPLIE ROT 10 11.4 M/L (STAPLE) IMPLANT
CLIP VESOLOCK MED LG 6/CT (CLIP) IMPLANT
COVER MAYO STAND STRL (DRAPES) IMPLANT
COVER SURGICAL LIGHT HANDLE (MISCELLANEOUS) ×2 IMPLANT
COVER WAND RF STERILE (DRAPES) ×2 IMPLANT
DRAPE C-ARM 42X120 X-RAY (DRAPES) IMPLANT
DRSG TEGADERM 2-3/8X2-3/4 SM (GAUZE/BANDAGES/DRESSINGS) ×2 IMPLANT
ELECT PENCIL ROCKER SW 15FT (MISCELLANEOUS) IMPLANT
ELECT REM PT RETURN 15FT ADLT (MISCELLANEOUS) ×2 IMPLANT
ENDOLOOP SUT PDS II  0 18 (SUTURE) ×2
ENDOLOOP SUT PDS II 0 18 (SUTURE) ×2 IMPLANT
GAUZE SPONGE 2X2 8PLY STRL LF (GAUZE/BANDAGES/DRESSINGS) ×1 IMPLANT
GLOVE BIO SURGEON STRL SZ7.5 (GLOVE) ×2 IMPLANT
GLOVE BIOGEL PI IND STRL 7.0 (GLOVE) ×4 IMPLANT
GLOVE BIOGEL PI IND STRL 7.5 (GLOVE) ×2 IMPLANT
GLOVE BIOGEL PI INDICATOR 7.0 (GLOVE) ×4
GLOVE BIOGEL PI INDICATOR 7.5 (GLOVE) ×2
GLOVE INDICATOR 8.0 STRL GRN (GLOVE) ×2 IMPLANT
GLOVE SURG SIGNA 7.5 PF LTX (GLOVE) ×2 IMPLANT
GOWN STRL REUS W/ TWL LRG LVL3 (GOWN DISPOSABLE) ×1 IMPLANT
GOWN STRL REUS W/TWL LRG LVL3 (GOWN DISPOSABLE) ×1
GOWN STRL REUS W/TWL XL LVL3 (GOWN DISPOSABLE) ×8 IMPLANT
GRASPER SUT TROCAR 14GX15 (MISCELLANEOUS) ×2 IMPLANT
HEMOSTAT ARISTA ABSORB 3G PWDR (HEMOSTASIS) IMPLANT
HEMOSTAT SNOW SURGICEL 2X4 (HEMOSTASIS) IMPLANT
KIT BASIN OR (CUSTOM PROCEDURE TRAY) ×2 IMPLANT
L-HOOK LAP DISP 36CM (ELECTROSURGICAL)
LHOOK LAP DISP 36CM (ELECTROSURGICAL) IMPLANT
POUCH RETRIEVAL ECOSAC 10 (ENDOMECHANICALS) ×1 IMPLANT
POUCH RETRIEVAL ECOSAC 10MM (ENDOMECHANICALS) ×1
SCISSORS LAP 5X35 DISP (ENDOMECHANICALS) ×2 IMPLANT
SET CHOLANGIOGRAPH MIX (MISCELLANEOUS) IMPLANT
SET IRRIG TUBING LAPAROSCOPIC (IRRIGATION / IRRIGATOR) ×2 IMPLANT
SET TUBE SMOKE EVAC HIGH FLOW (TUBING) ×2 IMPLANT
SLEEVE XCEL OPT CAN 5 100 (ENDOMECHANICALS) ×4 IMPLANT
SPONGE GAUZE 2X2 STER 10/PKG (GAUZE/BANDAGES/DRESSINGS) ×1
STRIP CLOSURE SKIN 1/2X4 (GAUZE/BANDAGES/DRESSINGS) ×2 IMPLANT
SUT MNCRL AB 4-0 PS2 18 (SUTURE) ×2 IMPLANT
SUT VIC AB 0 UR5 27 (SUTURE) IMPLANT
SUT VICRYL 0 TIES 12 18 (SUTURE) ×2 IMPLANT
SUT VICRYL 0 UR6 27IN ABS (SUTURE) IMPLANT
TOWEL OR 17X26 10 PK STRL BLUE (TOWEL DISPOSABLE) ×2 IMPLANT
TOWEL OR NON WOVEN STRL DISP B (DISPOSABLE) ×2 IMPLANT
TRAY LAPAROSCOPIC (CUSTOM PROCEDURE TRAY) ×2 IMPLANT
TROCAR BLADELESS OPT 5 100 (ENDOMECHANICALS) ×2 IMPLANT
TROCAR XCEL BLUNT TIP 100MML (ENDOMECHANICALS) ×2 IMPLANT
TROCAR XCEL NON-BLD 11X100MML (ENDOMECHANICALS) IMPLANT

## 2019-01-04 NOTE — Anesthesia Procedure Notes (Signed)
Procedure Name: Intubation Date/Time: 01/04/2019 9:25 AM Performed by: Maxwell Caul, CRNA Pre-anesthesia Checklist: Patient identified, Emergency Drugs available, Suction available and Patient being monitored Patient Re-evaluated:Patient Re-evaluated prior to induction Oxygen Delivery Method: Circle system utilized Preoxygenation: Pre-oxygenation with 100% oxygen Induction Type: IV induction, Rapid sequence and Cricoid Pressure applied Laryngoscope Size: Mac and 4 Grade View: Grade I Tube type: Oral Tube size: 7.5 mm Number of attempts: 1 Airway Equipment and Method: Stylet Placement Confirmation: ETT inserted through vocal cords under direct vision,  positive ETCO2 and breath sounds checked- equal and bilateral Secured at: 22 cm Tube secured with: Tape Dental Injury: Teeth and Oropharynx as per pre-operative assessment

## 2019-01-04 NOTE — H&P (Signed)
Travis [name] is an 48 y.o. male.   Chief Complaint: here for surgery HPI: 48 yo WM here for interval cholecystectomy for cholelithiasis after getting perc drain in the fall. Denies medical changes since last seen.  Stopped plavix 5 days ago  Past Medical History:  Diagnosis Date  . Acute on chronic systolic and diastolic heart failure, NYHA class 3 ([place]) 09/14/2017  . Acute osteomyelitis of metatarsal bone of left foot ([place]) 11/12/2015  . CAD in native artery 09/14/2017  . Closed fracture of right tibial plateau 11/11/2015  . Depression with anxiety   . Diabetes mellitus    INSULIN DEPENDENT Type II  . Diabetic peripheral neuropathy associated with type 2 diabetes mellitus ([place]) 08/30/2008   Qualifier: Diagnosis of  By: [name] Done FNP, Tori Milks    . Diabetic ulcer of left foot ([place]) 11/14/2015  . Gastroparesis   . GERD (gastroesophageal reflux disease)   . Hypertension   . Left ventricular aneurysm    REPORTS HE IS NOT AWAREW OF THIS   . Osteomyelitis ([place])   . Osteoporosis 11/12/2015  . Peripheral neuropathy   . Shortness of breath dyspnea    with exertion  . Vitamin D deficiency 11/12/2015    Past Surgical History:  Procedure Laterality Date  . AMPUTATION Left 11/28/2015   Procedure: Left 4th Ray Amputation;  Surgeon: [name], MD;  Location: [place];  Service: Orthopedics;  Laterality: Left;  . AMPUTATION Left 10/29/2016   Procedure: LEFT 5TH RAY AMPUTATION;  Surgeon: [name], MD;  Location: [place];  Service: Orthopedics;  Laterality: Left;  . ANTERIOR VITRECTOMY Left 04/12/2016   Procedure: ANTERIOR CHAMBER [place] OUT WITH GAS FLUID EXCHANGE;  Surgeon: [name], MD;  Location: [place];  Service: Ophthalmology;  Laterality: Left;  . CARDIAC CATHETERIZATION    . CATARACT EXTRACTION Left   . CORONARY ARTERY BYPASS GRAFT N/A 11/29/2017    LIMA-LAD, SVG-PDA, SVG-CFX  Procedure: CORONARY ARTERY BYPASS GRAFTING (CABG) times three using the left saphaneous vien. harvested  endoscopicly and left internal mammary artery.;  Surgeon: [name], MD;  Location: [facility];  Service: Open Heart Surgery;  Laterality: N/A;  . EYE SURGERY     left eye surgery 04/2016  . IR CHOLANGIOGRAM EXISTING TUBE  10/17/2018  . IR CHOLANGIOGRAM EXISTING TUBE  10/30/2018  . IR PERC CHOLECYSTOSTOMY  09/05/2018  . KNEE SURGERY Left   . LOOP RECORDER INSERTION N/A 06/28/2018   Procedure: LOOP RECORDER INSERTION;  Surgeon: Deboraha Sprang, MD;  Location: [facility];  Service: Cardiovascular;  Laterality: N/A;  . RIGHT/LEFT HEART CATH AND CORONARY ANGIOGRAPHY N/A 10/19/2017   Procedure: RIGHT/LEFT HEART CATH AND CORONARY ANGIOGRAPHY;  Surgeon: [name], [name], MD;  Location: [facility];  Service: Cardiovascular;  Laterality: N/A;  . SHOULDER SURGERY Right   . TEE WITHOUT CARDIOVERSION N/A 11/29/2017   Procedure: TRANSESOPHAGEAL ECHOCARDIOGRAM (TEE);  Surgeon: [name], Collier Salina, MD;  Location: [place];  Service: Open Heart Surgery;  Laterality: N/A;  . TEE WITHOUT CARDIOVERSION N/A 06/28/2018   Procedure: TRANSESOPHAGEAL ECHOCARDIOGRAM (TEE);  Surgeon: [name], MD;  Location: [facility];  Service: Cardiovascular;  Laterality: N/A;  . TOE AMPUTATION Left 11/28/15   4th toe    Family History  Problem Relation Age of Onset  . Cancer Paternal Grandmother   . COPD Mother   . Heart failure Mother   . Anxiety disorder Mother   . Depression Mother   . Fibromyalgia Mother   .  Esophageal cancer Father        Smoker, still does  . Diabetes Sister   . Fibromyalgia Sister   . Diabetes Maternal Uncle   . Diabetes Maternal Grandmother   . GER disease Sister   . Liver disease Paternal Grandfather   . Hemachromatosis Paternal Grandfather   . Diabetes Maternal Grandfather    Social History:  reports that he has never smoked. He has never used smokeless tobacco. He reports that he does not drink alcohol or use drugs.  Allergies: No Known Allergies  Medications Prior to  Admission  Medication Sig Dispense Refill  . aspirin EC 325 MG EC tablet Take 1 tablet (325 mg total) by mouth daily. (Patient taking differently: Take 325 mg by mouth at bedtime. ) 30 tablet 0  . clopidogrel (PLAVIX) 75 MG tablet Take 1 tablet (75 mg total) by mouth daily. (Patient taking differently: Take 75 mg by mouth at bedtime. ) 90 tablet 1  . Ferrous Sulfate (IRON) 325 (65 Fe) MG TABS Take 1 tablet (325 mg total) by mouth 2 (two) times daily after a meal. 30 each 0  . furosemide (LASIX) 20 MG tablet Take 1 tablet (20 mg total) by mouth daily. 90 tablet 3  . insulin detemir (LEVEMIR) 100 UNIT/ML injection Inject 0.2 mLs (20 Units total) into the skin at bedtime. Take 1/2 of normal dose tonight 06/27/18 for planned procedure tomorrow. Then resume normal dose (Patient taking differently: Inject 20 Units into the skin at bedtime. ) 30 mL 3  . lansoprazole (PREVACID) 30 MG capsule TAKE ONE CAPSULE BY MOUTH DAILY AT 12 NOON (Patient taking differently: Take 30 mg by mouth daily at 12 noon. ) 90 capsule 1  . losartan (COZAAR) 25 MG tablet Take 12.5 mg by mouth daily.   5  . metFORMIN (GLUCOPHAGE XR) 750 MG 24 hr tablet TAKE 1 TAB DAILY FOR 1 WEEK THEN INCREASE TO 2 TABS DAILY (Patient taking differently: Take 1,500 mg by mouth daily. ) 180 tablet 3  . metoCLOPramide (REGLAN) 10 MG tablet Take 1 tablet (10 mg total) by mouth every 6 (six) hours as needed for nausea. 60 tablet 1  . metoprolol succinate (TOPROL-XL) 25 MG 24 hr tablet Take 0.5 tablets (12.5 mg total) by mouth daily. 15 tablet 4  . rosuvastatin (CRESTOR) 40 MG tablet Take 1 tablet (40 mg total) by mouth daily. 30 tablet 5  . Blood Glucose Monitoring Suppl (ONETOUCH VERIO) w/Device KIT 1 application by Does not apply route 4 (four) times daily. 1 kit 0  . glucose blood (ONETOUCH VERIO) test strip 1 each by Other route as needed for other. Use as instructed 100 each 12  . ondansetron (ZOFRAN) 4 MG tablet Take 1 tablet (4 mg total) by mouth  every 8 (eight) hours as needed for nausea or vomiting. (Patient not taking: Reported on 12/28/2018) 20 tablet 0  . ONETOUCH DELICA LANCETS 21Y MISC 1 application by Does not apply route 4 (four) times daily. 100 each 12  . traZODone (DESYREL) 50 MG tablet Take 0.5-1 tablets (25-50 mg total) by mouth at bedtime as needed for sleep. (Patient not taking: Reported on 12/28/2018) 30 tablet 3    Results for orders placed or performed during the hospital encounter of 01/04/19 (from the past 48 hour(s))  Glucose, capillary     Status: Abnormal   Collection Time: 01/04/19  7:57 AM  Result Value Ref Range   Glucose-Capillary 147 (H) 70 - 99 mg/dL   No results  found.  Review of Systems  Gastrointestinal: Positive for abdominal pain and nausea.    Blood pressure 124/68, pulse 65, temperature 97.8 F (36.6 C), temperature source Oral, resp. rate 16, height 6' 2" (1.88 m), weight 99.2 kg, SpO2 100 %. Physical Exam  Vitals reviewed. Constitutional: He is oriented to person, place, and time. He appears well-developed and well-nourished. No distress.  HENT:  Head: Normocephalic and atraumatic.  Right Ear: External ear normal.  Left Ear: External ear normal.  Eyes: Conjunctivae are normal. No scleral icterus.  Neck: Normal range of motion. Neck supple. No tracheal deviation present. No thyromegaly present.  Cardiovascular: Normal rate and normal heart sounds.  Respiratory: Effort normal and breath sounds normal. No stridor. No respiratory distress. He has no wheezes.  GI: Soft. He exhibits no distension. There is no abdominal tenderness. There is no rebound.  Soft, +drain in GB  Musculoskeletal:        General: No tenderness or edema.  Lymphadenopathy:    He has no cervical adenopathy.  Neurological: He is alert and oriented to person, place, and time. He exhibits normal muscle tone.  Skin: Skin is warm and dry. No rash noted. He is not diaphoretic. No erythema. No pallor.  Psychiatric: He has a  normal mood and affect. His behavior is normal. Judgment and thought content normal.     Assessment/Plan Chronic cholecystitis s/p perc drain DM2 HTN CAD s/p CABG H/o stroke  To OR for lap chole  All questions asked and answered  Leighton Ruff. Redmond Pulling, MD, FACS General, Bariatric, & Minimally Invasive Surgery [facility], [place]   [name], MD 01/04/2019, 8:58 AM

## 2019-01-04 NOTE — Op Note (Signed)
Travis CabalMichael T Fischman [id number] 08/08/1971 01/04/2019  Laparoscopic Cholecystectomy  Procedure Note  Indications: This patient presents with symptomatic gallbladder disease and will undergo laparoscopic cholecystectomy.  Pre-operative Diagnosis: chronic calculous cholecystitis s/p percutaneous cholecystostomy tube placement  Post-operative Diagnosis: Same  Surgeon: Gaynelle AduEric Catelynn Sparger MD FACS  Assistants: Carman Chingoug Blackman MD FACS (an assistant was required due to the chronicity of his symptoms (>3556yr) and need to help identify anatomy  Anesthesia: General endotracheal anesthesia  Procedure Details  The patient was seen again in the Holding Room. The risks, benefits, complications, treatment options, and expected outcomes were discussed with the patient. The possibilities of reaction to medication, pulmonary aspiration, perforation of viscus, bleeding, recurrent infection, finding a normal gallbladder, the need for additional procedures, failure to diagnose a condition, the possible need to convert to an open procedure, and creating a complication requiring transfusion or operation were discussed with the patient. The likelihood of improving the patient's symptoms with return to their baseline status is good.  The patient and/or family concurred with the proposed plan, giving informed consent. The site of surgery properly noted. The patient was taken to Operating Room, identified as Travis [name] and the procedure verified as Laparoscopic Cholecystectomy. A Time Out was held and the above information confirmed. Antibiotic prophylaxis was administered.   Prior to the induction of general anesthesia, antibiotic prophylaxis was administered. General endotracheal anesthesia was then administered and tolerated well. After the induction, the abdomen was prepped with Chloraprep and draped in the sterile fashion. The patient was positioned in the supine position.  Local anesthetic agent was injected into the skin near  the umbilicus and an incision made. We dissected down to the abdominal fascia with blunt dissection.  The fascia was incised vertically and we entered the peritoneal cavity bluntly.  A pursestring suture of 0-Vicryl was placed around the fascial opening.  The Hasson cannula was inserted and secured with the stay suture.  Pneumoperitoneum was then created with CO2 and tolerated well without any adverse changes in the patient's vital signs. An 5-mm port was placed in the subxiphoid position.  Two 5-mm ports were placed in the right upper quadrant. All skin incisions were infiltrated with a local anesthetic agent before making the incision and placing the trocars.   We positioned the patient in reverse Trendelenburg, tilted slightly to the patient's left.  The gallbladder was identified, the fundus grasped and retracted cephalad. Adhesions were lysed bluntly and with the electrocautery where indicated, taking care not to injure any adjacent organs or viscus. The gallbladder drain was visualized coming into the RUQ above the liver going thru the liver into the gallbladder. The gallbladder was decompressed and contracted. The infundibulum was grasped and retracted laterally, exposing the peritoneum overlying the triangle of Calot. This was then divided and exposed in a blunt fashion. A critical view of the cystic duct and cystic artery was obtained.  He had a dilated cystic duct. I ended up mobilizing the gallbladder both laterally and medially with hook cautery to make sure there were no other structures going into the gallbladder. The cystic duct was clearly identified and bluntly dissected circumferentially.   The cystic artery which had been identified & dissected free was ligated with clips and divided as well.  Due to the dilation of the cystic duct it would not accommodate a clip all the way across. So I transected it and placed 2 PDS endoloops around the cystic duct stump.   The gallbladder was dissected  from the liver bed  in retrograde fashion with the electrocautery. The gallbladder was removed along with part of the gallbladder drain and placed in an Ecco sac.  The gallbladder and Ecco sac were then removed through the umbilical port site. The external drain piece was removed. The liver bed was irrigated and inspected. Hemostasis was achieved with the electrocautery. Copious irrigation was utilized and was repeatedly aspirated until clear.  The pursestring suture was used to close the umbilical fascia.    We again inspected the right upper quadrant for hemostasis.  The umbilical closure was inspected and there was no air leak and nothing trapped within the closure. Pneumoperitoneum was released as we removed the trocars.  4-0 Monocryl was used to close the skin.   Benzoin, steri-strips, and clean dressings were applied. The patient was then extubated and brought to the recovery room in stable condition. Instrument, sponge, and needle counts were correct at closure and at the conclusion of the case.   Findings: Chronic Cholecystitis with Cholelithiasis  Estimated Blood Loss: Minimal         Drains: none         Specimens: Gallbladder           Complications: None; patient tolerated the procedure well.         Disposition: PACU - hemodynamically stable.         Condition: stable  Mary SellaEric M. Andrey CampanileWilson, MD, FACS General, Bariatric, & Minimally Invasive Surgery [facility], [place]

## 2019-01-04 NOTE — Anesthesia Postprocedure Evaluation (Signed)
Anesthesia Post Note  Patient: Travis [name]  Procedure(s) Performed: LAPAROSCOPIC CHOLECYSTECTOMY (N/A Abdomen)     Patient location during evaluation: PACU Anesthesia Type: General Level of consciousness: awake and alert Pain management: pain level controlled Vital Signs Assessment: post-procedure vital signs reviewed and stable Respiratory status: spontaneous breathing, nonlabored ventilation, respiratory function stable and patient connected to nasal cannula oxygen Cardiovascular status: blood pressure returned to baseline and stable Postop Assessment: no apparent nausea or vomiting Anesthetic complications: no    Last Vitals:  Vitals:   01/04/19 1233 01/04/19 1340  BP: (!) 157/90 129/74  Pulse: 64 62  Resp: 16 15  Temp: 36.6 C 36.8 C  SpO2: 100% 99%    Last Pain:  Vitals:   01/04/19 1340  TempSrc: Oral  PainSc:                  Shelton Silvas

## 2019-01-04 NOTE — Transfer of Care (Signed)
Immediate Anesthesia Transfer of Care Note  Patient: Travis [name]  Procedure(s) Performed: LAPAROSCOPIC CHOLECYSTECTOMY (N/A Abdomen)  Patient Location: PACU  Anesthesia Type:General  Level of Consciousness: awake, alert , oriented and patient cooperative  Airway & Oxygen Therapy: Patient Spontanous Breathing and Patient connected to face mask oxygen  Post-op Assessment: Report given to RN, Post -op Vital signs reviewed and stable and Patient moving all extremities  Post vital signs: Reviewed and stable  Last Vitals:  Vitals Value Taken Time  BP 140/71 01/04/2019 10:39 AM  Temp    Pulse 66 01/04/2019 10:40 AM  Resp 16 01/04/2019 10:40 AM  SpO2 100 % 01/04/2019 10:40 AM  Vitals shown include unvalidated device data.  Last Pain:  Vitals:   01/04/19 0741  TempSrc: Oral      Patients Stated Pain Goal: 4 (01/04/19 0749)  Complications: No apparent anesthesia complications

## 2019-01-05 ENCOUNTER — Encounter (HOSPITAL_COMMUNITY): Payer: Self-pay | Admitting: General Surgery

## 2019-01-05 DIAGNOSIS — K8010 Calculus of gallbladder with chronic cholecystitis without obstruction: Secondary | ICD-10-CM | POA: Diagnosis not present

## 2019-01-05 LAB — GLUCOSE, CAPILLARY: Glucose-Capillary: 88 mg/dL (ref 70–99)

## 2019-01-05 MED ORDER — OXYCODONE HCL 5 MG PO TABS: 5.0000 mg | ORAL_TABLET | Freq: Four times a day (QID) | ORAL | 0 refills | Status: DC | PRN

## 2019-01-05 NOTE — Discharge Summary (Signed)
Physician Discharge Summary  Travis [name] NKN:[id number] DOB: 09-11-1971 DOA: 01/04/2019  PCP: [place] Bing, DO  Admit date: 01/04/2019 Discharge date: 01/05/2019  Recommendations for Outpatient Follow-up:    Follow-up Information    [name], MD. Schedule an appointment as soon as possible for a visit in 3 weeks.   Specialty:  General Surgery Why:  For wound re-check Contact information: [place] [address] [phone]          Discharge Diagnoses:  1. Chronic calculous cholecystitis with h/o cholecystostomy drain s/p Lap chole 2. Gastroparesis 3. HTN 4. H/o CVA 5. DM2 6. CAD s/p cabg 7. HPL  Surgical Procedure: lap chole 01/04/2019  Discharge Condition: good Disposition: home  Diet recommendation: regular  Filed Weights   01/04/19 0749  Weight: 99.2 kg    History of present illness:  48 yo WM here for interval cholecystectomy for cholelithiasis after getting perc drain in the fall. Denies medical changes since last seen.  Stopped plavix 5 days ago  Hospital Course:  He was kept overnight due to his medical history. He did well. Vitals remained stable. On pod 1 doing well. Pain well controlled. Tolerating liquids and pain controlled. He had ambulated. Stable for dc  Can RESUME PLAVIX TONIGHT  BP 120/68 (BP Location: Right Arm)   Pulse 70   Temp 98.2 F (36.8 C) (Oral)   Resp 16   Ht '6\' 2"'  (1.88 m)   Wt 99.2 kg   SpO2 98%   BMI 28.09 kg/m   Gen: alert, NAD, non-toxic appearing Pupils: equal, no scleral icterus Pulm: Lungs clear to auscultation, symmetric chest rise CV: regular rate and rhythm Abd: soft, min tender, nondistended.  No cellulitis. No incisional hernia Ext: no edema, no calf tenderness Skin: no rash, no jaundice    Discharge Instructions  Discharge Instructions    Call MD for:   Complete by:  As directed    Temperature >101   Call MD for:  hives   Complete by:  As directed    Call MD for:   persistant dizziness or light-headedness   Complete by:  As directed    Call MD for:  persistant nausea and vomiting   Complete by:  As directed    Call MD for:  redness, tenderness, or signs of infection (pain, swelling, redness, odor or green/yellow discharge around incision site)   Complete by:  As directed    Call MD for:  severe uncontrolled pain   Complete by:  As directed    Diet general   Complete by:  As directed    Discharge instructions   Complete by:  As directed    See [facility] discharge instructions   Increase activity slowly   Complete by:  As directed      Allergies as of 01/05/2019   No Known Allergies     Medication List    STOP taking these medications   ondansetron 4 MG tablet Commonly known as:  ZOFRAN   traZODone 50 MG tablet Commonly known as:  DESYREL     TAKE these medications   aspirin 325 MG EC tablet Take 1 tablet (325 mg total) by mouth daily. What changed:  when to take this   clopidogrel 75 MG tablet Commonly known as:  PLAVIX Take 1 tablet (75 mg total) by mouth daily. What changed:  when to take this   furosemide 20 MG tablet Commonly known as:  LASIX Take 1 tablet (20  mg total) by mouth daily.   glucose blood test strip Commonly known as:  ONETOUCH VERIO 1 each by Other route as needed for other. Use as instructed   insulin detemir 100 UNIT/ML injection Commonly known as:  LEVEMIR Inject 0.2 mLs (20 Units total) into the skin at bedtime. Take 1/2 of normal dose tonight 06/27/18 for planned procedure tomorrow. Then resume normal dose What changed:  additional instructions   Iron 325 (65 Fe) MG Tabs Take 1 tablet (325 mg total) by mouth 2 (two) times daily after a meal.   lansoprazole 30 MG capsule Commonly known as:  PREVACID TAKE ONE CAPSULE BY MOUTH DAILY AT 12 NOON What changed:  See the new instructions.   losartan 25 MG tablet Commonly known as:  COZAAR Take 12.5 mg by mouth daily.   metFORMIN 750 MG 24 hr  tablet Commonly known as:  GLUCOPHAGE XR TAKE 1 TAB DAILY FOR 1 WEEK THEN INCREASE TO 2 TABS DAILY What changed:    how much to take  how to take this  when to take this  additional instructions   metoCLOPramide 10 MG tablet Commonly known as:  REGLAN Take 1 tablet (10 mg total) by mouth every 6 (six) hours as needed for nausea.   metoprolol succinate 25 MG 24 hr tablet Commonly known as:  TOPROL-XL Take 0.5 tablets (12.5 mg total) by mouth daily.   ONETOUCH DELICA LANCETS 38H Misc 1 application by Does not apply route 4 (four) times daily.   ONETOUCH VERIO w/Device Kit 1 application by Does not apply route 4 (four) times daily.   oxyCODONE 5 MG immediate release tablet Commonly known as:  Oxy IR/ROXICODONE Take 1 tablet (5 mg total) by mouth every 6 (six) hours as needed for breakthrough pain.   rosuvastatin 40 MG tablet Commonly known as:  CRESTOR Take 1 tablet (40 mg total) by mouth daily.      Follow-up Information    [name], MD. Schedule an appointment as soon as possible for a visit in 3 weeks.   Specialty:  General Surgery Why:  For wound re-check Contact information: [place] [address] [phone]            The results of significant diagnostics from this hospitalization (including imaging, microbiology, ancillary and laboratory) are listed below for reference.    Significant Diagnostic Studies: No results found.  Microbiology: No results found for this or any previous visit (from the past 240 hour(s)).   Labs: Basic Metabolic Panel: No results for input(s): NA, K, CL, CO2, GLUCOSE, BUN, CREATININE, CALCIUM, MG, PHOS in the last 168 hours. Liver Function Tests: No results for input(s): AST, ALT, ALKPHOS, BILITOT, PROT, ALBUMIN in the last 168 hours. No results for input(s): LIPASE, AMYLASE in the last 168 hours. No results for input(s): AMMONIA in the last 168 hours. CBC: No results for input(s): WBC,  NEUTROABS, HGB, HCT, MCV, PLT in the last 168 hours. Cardiac Enzymes: No results for input(s): CKTOTAL, CKMB, CKMBINDEX, TROPONINI in the last 168 hours. BNP: BNP (last 3 results) No results for input(s): BNP in the last 8760 hours.  ProBNP (last 3 results) No results for input(s): PROBNP in the last 8760 hours.  CBG: Recent Labs  Lab 01/04/19 0757 01/04/19 1043 01/04/19 1609 01/04/19 2218 01/05/19 0727  GLUCAP 147* 179* 144* 123* 88    Active Problems:   S/P laparoscopic cholecystectomy   Time coordinating discharge: 15 min  Signed:  Leighton Ruff  Redmond Pulling, [facility], [place] 01/05/2019, 8:11 AM

## 2019-01-05 NOTE — Discharge Instructions (Signed)
RESUME PLAVIX TONIGHT - Friday Jan 24th  [facility] LAPAROSCOPIC SURGERY: POST OP INSTRUCTIONS Always review your discharge instruction sheet given to you by the facility where your surgery was performed. IF YOU HAVE DISABILITY OR FAMILY LEAVE FORMS, YOU MUST BRING THEM TO THE OFFICE FOR PROCESSING.   DO NOT GIVE THEM TO YOUR DOCTOR.  PAIN CONTROL  1. First take acetaminophen (Tylenol) AND/or ibuprofen (Advil) to control your pain after surgery.  Follow directions on package.  Taking acetaminophen (Tylenol) and/or ibuprofen (Advil) regularly after surgery will help to control your pain and lower the amount of prescription pain medication you may need.  You should not take more than 3,000 mg (3 grams) of acetaminophen (Tylenol) in 24 hours.  You should not take ibuprofen (Advil), aleve, motrin, naprosyn or other NSAIDS if you have a history of stomach ulcers or chronic kidney disease.  2. A prescription for pain medication may be given to you upon discharge.  Take your pain medication as prescribed, if you still have uncontrolled pain after taking acetaminophen (Tylenol) or ibuprofen (Advil). 3. Use ice packs to help control pain. 4. If you need a refill on your pain medication, please contact your pharmacy.  They will contact our office to request authorization. Prescriptions will not be filled after 5pm or on week-ends.  HOME MEDICATIONS 5. Take your usually prescribed medications unless otherwise directed.  DIET 6. You should follow a light diet the first few days after arrival home.  Be sure to include lots of fluids daily. Avoid fatty, fried foods.   CONSTIPATION 7. It is common to experience some constipation after surgery and if you are taking pain medication.  Increasing fluid intake and taking a stool softener (such as Colace) will usually help or prevent this problem from occurring.  A mild laxative (Milk of Magnesia or Miralax) should be taken according to  package instructions if there are no bowel movements after 48 hours.  WOUND/INCISION CARE 8. Most patients will experience some swelling and bruising in the area of the incisions.  Ice packs will help.  Swelling and bruising can take several days to resolve.  9. Unless discharge instructions indicate otherwise, follow guidelines below  a. STERI-STRIPS - you may remove your outer bandages 48 hours after surgery, and you may shower at that time.  You have steri-strips (small skin tapes) in place directly over the incision.  These strips should be left on the skin for 7-10 days.   b. DERMABOND/SKIN GLUE - you may shower in 24 hours.  The glue will flake off over the next 2-3 weeks. 10. Any sutures or staples will be removed at the office during your follow-up visit.  ACTIVITIES 11. You may resume regular (light) daily activities beginning the next day--such as daily self-care, walking, climbing stairs--gradually increasing activities as tolerated.  You may have sexual intercourse when it is comfortable.  Refrain from any heavy lifting or straining until approved by your doctor. a. You may drive when you are no longer taking prescription pain medication, you can comfortably wear a seatbelt, and you can safely maneuver your car and apply brakes.  FOLLOW-UP 12. You should see your doctor in the office for a follow-up appointment approximately 2-3 weeks after your surgery.  You should have been given your post-op/follow-up appointment when your surgery was scheduled.  If you did not receive a post-op/follow-up appointment, make sure that you call for this appointment within a day or two after you arrive home  to insure a convenient appointment time.  OTHER INSTRUCTIONS 13.   WHEN TO CALL YOUR DOCTOR: 1. Fever over 101.0 2. Inability to urinate 3. Continued bleeding from incision. 4. Increased pain, redness, or drainage from the incision. 5. Increasing abdominal pain  The clinic staff is available  to answer your questions during regular business hours.  Please dont hesitate to call and ask to speak to one of the nurses for clinical concerns.  If you have a medical emergency, go to the nearest emergency room or call 911.  A surgeon from [facility] is always on call at the hospital. [address], [address], [place], [place]  [id number] ? [address] [address], [place], [place]   [id number] [phone] ? [phone] ? FAX [phone] Web site: [email or web address]     Managing Your Pain After Surgery Without Opioids    Thank you for participating in our program to help patients manage their pain after surgery without opioids. This is part of our effort to provide you with the best care possible, without exposing you or your family to the risk that opioids pose.  What pain can I expect after surgery? You can expect to have some pain after surgery. This is normal. The pain is typically worse the day after surgery, and quickly begins to get better. Many studies have found that many patients are able to manage their pain after surgery with Over-the-Counter (OTC) medications such as Tylenol and Motrin. If you have a condition that does not allow you to take Tylenol or Motrin, notify your surgical team.  How will I manage my pain? The best strategy for controlling your pain after surgery is around the clock pain control with Tylenol (acetaminophen) and Motrin (ibuprofen or Advil). Alternating these medications with each other allows you to maximize your pain control. In addition to Tylenol and Motrin, you can use heating pads or ice packs on your incisions to help reduce your pain.  How will I alternate your regular strength over-the-counter pain medication? You will take a dose of pain medication every three hours. ; Start by taking 650 mg of Tylenol (2 pills of 325 mg) ; 3 hours later take 600 mg of Motrin (3 pills of 200 mg) ; 3 hours after taking the  Motrin take 650 mg of Tylenol ; 3 hours after that take 600 mg of Motrin.   - 1 -  See example - if your first dose of Tylenol is at 12:00 PM   12:00 PM Tylenol 650 mg (2 pills of 325 mg)  3:00 PM Motrin 600 mg (3 pills of 200 mg)  6:00 PM Tylenol 650 mg (2 pills of 325 mg)  9:00 PM Motrin 600 mg (3 pills of 200 mg)  Continue alternating every 3 hours   We recommend that you follow this schedule around-the-clock for at least 3 days after surgery, or until you feel that it is no longer needed. Use the table on the last page of this handout to keep track of the medications you are taking. Important: Do not take more than 3000mg  of Tylenol or 3200mg  of Motrin in a 24-hour period. Do not take ibuprofen/Motrin if you have a history of bleeding stomach ulcers, severe kidney disease, &/or actively taking a blood thinner  What if I still have pain? If you have pain that is not controlled with the over-the-counter pain medications (Tylenol and Motrin or Advil) you might have what we call breakthrough pain. You will receive a  prescription for a small amount of an opioid pain medication such as Oxycodone, Tramadol, or Tylenol with Codeine. Use these opioid pills in the first 24 hours after surgery if you have breakthrough pain. Do not take more than 1 pill every 4-6 hours.  If you still have uncontrolled pain after using all opioid pills, don't hesitate to call our staff using the number provided. We will help make sure you are managing your pain in the best way possible, and if necessary, we can provide a prescription for additional pain medication.   Day 1    Time  Name of Medication Number of pills taken  Amount of Acetaminophen  Pain Level   Comments  AM PM       AM PM       AM PM       AM PM       AM PM       AM PM       AM PM       AM PM       Total Daily amount of Acetaminophen Do not take more than  3,000 mg per day      Day 2    Time  Name of Medication Number of  pills taken  Amount of Acetaminophen  Pain Level   Comments  AM PM       AM PM       AM PM       AM PM       AM PM       AM PM       AM PM       AM PM       Total Daily amount of Acetaminophen Do not take more than  3,000 mg per day      Day 3    Time  Name of Medication Number of pills taken  Amount of Acetaminophen  Pain Level   Comments  AM PM       AM PM       AM PM       AM PM          AM PM       AM PM       AM PM       AM PM       Total Daily amount of Acetaminophen Do not take more than  3,000 mg per day      Day 4    Time  Name of Medication Number of pills taken  Amount of Acetaminophen  Pain Level   Comments  AM PM       AM PM       AM PM       AM PM       AM PM       AM PM       AM PM       AM PM       Total Daily amount of Acetaminophen Do not take more than  3,000 mg per day      Day 5    Time  Name of Medication Number of pills taken  Amount of Acetaminophen  Pain Level   Comments  AM PM       AM PM       AM PM       AM PM       AM PM  AM PM       AM PM       AM PM       Total Daily amount of Acetaminophen Do not take more than  3,000 mg per day       Day 6    Time  Name of Medication Number of pills taken  Amount of Acetaminophen  Pain Level  Comments  AM PM       AM PM       AM PM       AM PM       AM PM       AM PM       AM PM       AM PM       Total Daily amount of Acetaminophen Do not take more than  3,000 mg per day      Day 7    Time  Name of Medication Number of pills taken  Amount of Acetaminophen  Pain Level   Comments  AM PM       AM PM       AM PM       AM PM       AM PM       AM PM       AM PM       AM PM       Total Daily amount of Acetaminophen Do not take more than  3,000 mg per day        For additional information about how and where to safely dispose of unused opioid medications - [employer]  Disclaimer: This document contains  information and/or instructional materials adapted from [place] for the typical patient with your condition. It does not replace medical advice from your health care provider because your experience may differ from that of the typical patient. Talk to your health care provider if you have any questions about this document, your condition or your treatment plan. Adapted from [place]

## 2019-01-08 ENCOUNTER — Ambulatory Visit (INDEPENDENT_AMBULATORY_CARE_PROVIDER_SITE_OTHER): Payer: Medicare Other | Admitting: Family Medicine

## 2019-01-08 ENCOUNTER — Inpatient Hospital Stay (HOSPITAL_COMMUNITY): Admission: RE | Admit: 2019-01-08 | Payer: Medicare Other | Source: Ambulatory Visit

## 2019-01-08 ENCOUNTER — Telehealth: Payer: Self-pay | Admitting: Family Medicine

## 2019-01-08 VITALS — BP 119/75 | HR 71 | Temp 98.4°F | Wt 216.6 lb

## 2019-01-08 DIAGNOSIS — M79602 Pain in left arm: Secondary | ICD-10-CM

## 2019-01-08 NOTE — Patient Instructions (Signed)
Please go right now for an ultrasound of your arm to take a look at the veins there.  I will call with results and we will decide on further plan from there.  Thanks, Latrelle Dodrill, MD

## 2019-01-08 NOTE — Telephone Encounter (Signed)
Attempted again. No answer. Latrelle Dodrill, MD

## 2019-01-08 NOTE — Progress Notes (Signed)
Date of Visit: 01/08/2019   HPI:  Patient presents to discuss pain in left forearm.  Patient recently underwent cholecystectomy on 1/23 and was admitted overnight for observation due to his complex medical history. Was discharged on 1/24. Patient reports a care provider removed his IV at the time of discharge and that it is possible that a piece of the IV broke off and remained in his arm, based on something that care provider said (patient reports she pulled on the IV and said "it would be good if it all came out", then pulled more and got another piece out).  Since discharge he has had pain and tenderness in the area around the site of the IV. Denies any fevers. He is concerned a piece of the IV is still in his arm and that he could get an infection. He is otherwise doing well postoperatively.  ROS: See HPI.  PMFSH: history of type 2 diabetes, allergic rhinitis, ED, GERD, vit D deficiency, osteoporosis, prior diabetic foot ulcer s/p ray amputation, gastroparesis, diastolic & systolic CHF, CAD, major depression, ischemic cardiomyopathy, prior ischemic stroke, hypertension, hyperlipidemia, recent cholecystectomy  PHYSICAL EXAM: BP 119/75   Pulse 71   Temp 98.4 F (36.9 C) (Oral)   Wt 216 lb 9.6 oz (98.2 kg)   SpO2 99%   BMI 27.81 kg/m  Gen: no acute distress, cooperative HEENT: normocephalic, atraumatic Lungs: normal respiratory effort Neuro: alert, grossly nonfocal, speech normal Ext: left forearm with moderate tenderness overlying site of prior IV insertion. +superficial longitudinal palpable cord present with very mild erythema overlying the cord. Tender over this area. Grip 5/5 on left side. 2+ radial pulse.  ASSESSMENT/PLAN:  Patient presents for evaluation of complication of L forearm IV; he is concerned for retained piece of IV. Exam shows tender superficial palpable cord, with concern for superficial thrombophlebitis. Scheduled patient for doppler ultrasound of forearm this  afternoon to further assess. Patient was dissatisfied that we could not do the ultrasound here. U/S was ordered stat in order to get it scheduled for this afternoon, however when we went to give patient the appointment he was in the process of leaving the office saying he needed to go home and eat due to being a diabetic, and also that he was frustrated at already being at our office for one hour. I offered him snacks so that he could attend the ultrasound appointment but he declined. He then asked for ultrasound appointment to be scheduled for tomorrow.  We have rescheduled the ultrasound for tomorrow at 1pm. I will also have him get an xray to assess for foreign body. Pending the results of these tests, may also elect to put him on an antibiotic to treat any residual infection that may be present. Doubt systemic infection or bacteremia given his overall well appearance and lack of fevers. Attempted to call patient after visit with updated appointment info, and to tell him to get xray, but no answer. See phone note for further details.  [place] J. Pollie Meyer, MD [facility]

## 2019-01-08 NOTE — Telephone Encounter (Signed)
Called and left voice mail for patient concerning upcoming Ultrasound and Xray.  If patient calls back please give him information.  Glennie Hawk, CMA

## 2019-01-08 NOTE — Telephone Encounter (Signed)
Attempted to reach patient to let him know about rescheduled appointment for ultrasound. No answer. LVM asking him to call us back.  When he calls back please let him know:  - his ultrasound appointment is now at 1:00pm tomorrow. This will look for signs of blood clots in his arm.  - I also want him to get an xray to check for any signs of retained IV catheter. He does not need an appointment to have this done. [facility] or the first floor of the hospital radiology dept to get this.  - if pain or swelling worsen, or he has fever, he should go to ED or come [facility]   Red team, please try to call him again.  Thanks, Latrelle DodrillBrittany J Connie Lasater, MD

## 2019-01-09 ENCOUNTER — Telehealth: Payer: Self-pay | Admitting: Family Medicine

## 2019-01-09 ENCOUNTER — Telehealth: Payer: Self-pay

## 2019-01-09 ENCOUNTER — Ambulatory Visit (HOSPITAL_COMMUNITY)
Admission: RE | Admit: 2019-01-09 | Discharge: 2019-01-09 | Disposition: A | Discharge status: Home / Self Care | Payer: Medicare Other | Source: Ambulatory Visit | Attending: Family Medicine | Admitting: Family Medicine

## 2019-01-09 DIAGNOSIS — M79602 Pain in left arm: Secondary | ICD-10-CM | POA: Insufficient documentation

## 2019-01-09 NOTE — Telephone Encounter (Signed)
Received call from Vascular Lab that patient has superficial thrombus. Patient also had question about if he should have x-ray (per technician). Confirmed telephone number listed in Epic was correct, informed technician to let the patient know we would call him at listed number.   Attempted to call patient to follow up regarding questions and results at number confirmed during prior call. As superficial thrombus, no indication for anticoagulation. If returns call to nursing this afternoon, recommend heat to area, elevation, warm compresses, and pain control (had recent AKI and AST/ALT). X-ray has been ordered by Dr. Pollie Meyer.   Terisa Starr, MD  Family Medicine Teaching Service

## 2019-01-09 NOTE — Telephone Encounter (Signed)
Attempted once more to contact patient concerning Ultrasound appointment on 01/09/2019 at 1300 hours at [name].    Again there was no answer.  LVM to call back if there was an issue.  Glennie Hawk, CMA

## 2019-01-09 NOTE — Progress Notes (Signed)
Left upper extremity venous duplex completed - preliminary results in Chart review CV proc. Result given verbally to Dr Cloria Spring. [place] Tattiana Fakhouri,RVS 01/09/2019, 1:51 PM

## 2019-01-11 NOTE — Telephone Encounter (Signed)
Attempted myself to reach patient, no answer, left voicemail asking him to call back. Want to see how he is doing and review results with him.  Latrelle Dodrill, MD

## 2019-01-12 ENCOUNTER — Ambulatory Visit (INDEPENDENT_AMBULATORY_CARE_PROVIDER_SITE_OTHER): Payer: Medicare Other

## 2019-01-12 DIAGNOSIS — I639 Cerebral infarction, unspecified: Secondary | ICD-10-CM

## 2019-01-12 LAB — CUP PACEART REMOTE DEVICE CHECK
Date Time Interrogation Session: 20200131183854
Implantable Pulse Generator Implant Date: 20190717

## 2019-01-15 ENCOUNTER — Encounter (HOSPITAL_COMMUNITY): Payer: Medicare Other

## 2019-01-19 NOTE — Progress Notes (Signed)
[facility] Summary Report / Loop Recorder 

## 2019-02-06 ENCOUNTER — Other Ambulatory Visit: Payer: Self-pay | Admitting: Cardiovascular Disease

## 2019-02-14 ENCOUNTER — Encounter: Payer: Self-pay | Admitting: Family Medicine

## 2019-02-14 ENCOUNTER — Ambulatory Visit (INDEPENDENT_AMBULATORY_CARE_PROVIDER_SITE_OTHER): Payer: Medicare Other | Admitting: *Deleted

## 2019-02-14 ENCOUNTER — Ambulatory Visit (INDEPENDENT_AMBULATORY_CARE_PROVIDER_SITE_OTHER): Payer: Medicare Other | Admitting: Family Medicine

## 2019-02-14 ENCOUNTER — Other Ambulatory Visit: Payer: Self-pay

## 2019-02-14 VITALS — BP 122/72 | HR 81 | Temp 97.9°F | Ht 74.0 in | Wt 210.8 lb

## 2019-02-14 DIAGNOSIS — R0683 Snoring: Secondary | ICD-10-CM | POA: Diagnosis not present

## 2019-02-14 DIAGNOSIS — I5043 Acute on chronic combined systolic (congestive) and diastolic (congestive) heart failure: Secondary | ICD-10-CM

## 2019-02-14 DIAGNOSIS — L89892 Pressure ulcer of other site, stage 2: Secondary | ICD-10-CM | POA: Diagnosis not present

## 2019-02-14 DIAGNOSIS — Z794 Long term (current) use of insulin: Secondary | ICD-10-CM

## 2019-02-14 DIAGNOSIS — E1143 Type 2 diabetes mellitus with diabetic autonomic (poly)neuropathy: Secondary | ICD-10-CM

## 2019-02-14 DIAGNOSIS — I639 Cerebral infarction, unspecified: Secondary | ICD-10-CM

## 2019-02-14 DIAGNOSIS — I739 Peripheral vascular disease, unspecified: Secondary | ICD-10-CM

## 2019-02-14 HISTORY — DX: Pressure ulcer of other site, stage 2: L89.892

## 2019-02-14 NOTE — Patient Instructions (Addendum)
Thank you for coming in to see [place] today. Please see below to review our plan for today's visit.  Use a soft gel donut under the wound and avoid hard sole tight footware. Avoiding pressure on the left foot is key. Follow up in 2 weeks. The sleep study office will call you to set up the study.   Insomnia is a common problem (affects 10% of the population). Sleep hygiene is key to helping restore your sleep. Here are some basic sleep hygiene tips:  No electronic devices or TV in bed  Keep bedroom dark with comfortable cool temperature  No caffeine after 2-4pm  No alcohol within 6 hours of bedtime  No nicotine prior to bedtime  Avoid heavy meal and lots of liquid right before bed  Avoid naps or keep them brief (<30 mins)  Avoid lots of physical activity before sleep  Remember bed is for the 2 S's (sleep and sex)  Make a routine (bath, reading a book, etc.) prior to bed  Set a consistent wake time for all 7 days of the week  You can supplement with mindfulness meditation like deep breathing, stretching, low-intensity yoga, relaxing music   Please call the clinic at [phone] if your symptoms worsen or you have any concerns. It was our pleasure to serve you.  Durward Parcel, DO [facility], PGY-3

## 2019-02-14 NOTE — Assessment & Plan Note (Signed)
Prior history of amputation secondary to osteomyelitis.  TBI score 1.34 on left extremity with presence of pressure injury consistent with diabetic foot ulcer.  There is significant occlusion based on right foot consistent with PVD.  No signs of acute limb ischemia. - See plan for pressure injury stage II of left foot - Ambulatory referral to vascular surgery

## 2019-02-14 NOTE — Assessment & Plan Note (Signed)
Acute.  Secondary to peripheral neuropathy secondary to history of poorly controlled diabetes.  No signs of cellulitis or osseous involvement. - Given superficial debridement of callus with instructions to avoid weightbearing and use soft cushion to promote healing - Reviewed return precautions, RTC 2 weeks for follow-up

## 2019-02-14 NOTE — Progress Notes (Signed)
Subjective   Patient ID: MATI STELLWAGEN    DOB: Jan 30, 1971, 48 y.o. male   MRN: [id number]  CC: "Diabetes"  HPI: AZAAN MERKER is a 48 y.o. male who presents to clinic today for the following:  Left foot pressure injury: [place] noticed some blood on the kitchen floor yesterday and checked his lateral left foot with a mirror and discovered a new ulcer at the end of his prior amputation of the fourth and fifth digits.  He has no prior history of ulcers in this region.  He attributes this to his peripheral neuropathy has difficulty with both his feet and his hands.  He denies any purulent discharge, fevers or chills, pain, or erythema.  Patient denies history of chest pain, shortness of breath, lower extremity edema.  Diabetes: History of poor control for years ago in the 11's.  Recent A1c 6.9 in January.  He does require Levemir 20 mg daily along with his metformin extended release which he increased to 1500 mg daily since his last visit with me.  Insomnia: Difficulty for several years with 1-2 hours of sleep at a time during the evening.  Patient says he "has tried everything."  He does report occasional daytime naps.  He is unsure why he is having difficulty sleeping and cannot identify if this is more sleep onset or staying asleep.  He has no prior history of sleep study.  He does report snoring.  ROS: see HPI for pertinent.  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 Family history COPD, heart failure, esophogeal cancer (fatehr), fibromyalgia, DM. Smoking status reviewed. Medications reviewed.  Objective   BP 122/72   Pulse 81   Temp 97.9 F (36.6 C) (Oral)   Ht 6\' 2"  (1.88 m)   Wt 210 lb 12.8 oz (95.6 kg)   SpO2 99%   BMI 27.07  kg/m  Vitals and nursing note reviewed.  General: well nourished, well developed, NAD with non-toxic appearance HEENT: normocephalic, atraumatic, moist mucous membranes Cardiovascular: regular rate and rhythm without murmurs, rubs, or gallops Lungs: clear to auscultation bilaterally with normal work of breathing Abdomen: soft, non-tender, non-distended, normoactive bowel sounds Skin: warm, dry, no rashes, cap refill < 2 seconds, grade 2 pressure injury to left plantar surface of prior fourth and fifth transmetatarsal amputation Extremities: warm and well perfused, normal tone, no edema, 2+ dorsal pedal pulse bilaterally      Assessment & Plan   PVD (peripheral vascular disease) (HCC) Prior history of amputation secondary to osteomyelitis.  TBI score 1.34 on left extremity with presence of pressure injury consistent with diabetic foot ulcer.  There is significant occlusion based on right foot consistent with PVD.  No signs of acute limb ischemia. - See plan for pressure injury stage II of left foot - Ambulatory referral to vascular surgery  Pressure injury of left foot, stage 2 (HCC) Acute.  Secondary to peripheral neuropathy secondary to history of poorly controlled diabetes.  No signs of cellulitis or osseous involvement. 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  Controlled.  Requiring  insulin.  No hypoglycemia.   - Continue metformin XR 1500 mg daily and Levemir 20 mg daily - Next A1c 2 months  Orders Placed This Encounter  Procedures  . Ambulatory referral to Vascular Surgery    Referral Priority:   Routine    Referral Type:   Surgical    Referral Reason:   Specialty Services Required    Requested Specialty:    Vascular Surgery    Number of Visits Requested:   1  . Nocturnal polysomnography (NPSG)    Standing Status:   Future    Standing Expiration Date:   02/14/2020    Order Specific Question:   Where should this test be performed:    Answer:   [facility]  . POCT ABI Screening Pilot No Charge    This Order is for screening for the ABI Pilot.  It is a no charge exam.    No orders of the defined types were placed in this encounter.   Durward Parcel, DO [facility], PGY-3 02/14/2019, 12:23 PM

## 2019-02-14 NOTE — Assessment & Plan Note (Addendum)
Chronic.  Controlled.  Requiring insulin.  No hypoglycemia.   - Continue metformin XR 1500 mg daily and Levemir 20 mg daily - Next A1c 2 months

## 2019-02-15 LAB — CUP PACEART REMOTE DEVICE CHECK
Implantable Pulse Generator Implant Date: 20190717
MDC IDC SESS DTM: 20200304184011

## 2019-02-22 ENCOUNTER — Encounter (INDEPENDENT_AMBULATORY_CARE_PROVIDER_SITE_OTHER): Payer: Self-pay | Admitting: Orthopedic Surgery

## 2019-02-22 ENCOUNTER — Ambulatory Visit (INDEPENDENT_AMBULATORY_CARE_PROVIDER_SITE_OTHER): Payer: Medicare Other | Admitting: Physician Assistant

## 2019-02-22 ENCOUNTER — Other Ambulatory Visit: Payer: Self-pay

## 2019-02-22 ENCOUNTER — Ambulatory Visit (INDEPENDENT_AMBULATORY_CARE_PROVIDER_SITE_OTHER): Payer: Medicare Other

## 2019-02-22 VITALS — Ht 74.0 in | Wt 210.8 lb

## 2019-02-22 DIAGNOSIS — E1142 Type 2 diabetes mellitus with diabetic polyneuropathy: Secondary | ICD-10-CM | POA: Diagnosis not present

## 2019-02-22 DIAGNOSIS — Z89422 Acquired absence of other left toe(s): Secondary | ICD-10-CM | POA: Diagnosis not present

## 2019-02-22 DIAGNOSIS — E785 Hyperlipidemia, unspecified: Secondary | ICD-10-CM

## 2019-02-22 DIAGNOSIS — M19072 Primary osteoarthritis, left ankle and foot: Secondary | ICD-10-CM

## 2019-02-22 DIAGNOSIS — E11319 Type 2 diabetes mellitus with unspecified diabetic retinopathy without macular edema: Secondary | ICD-10-CM

## 2019-02-22 DIAGNOSIS — Z8631 Personal history of diabetic foot ulcer: Secondary | ICD-10-CM

## 2019-02-22 DIAGNOSIS — E11622 Type 2 diabetes mellitus with other skin ulcer: Secondary | ICD-10-CM | POA: Diagnosis not present

## 2019-02-22 DIAGNOSIS — Z794 Long term (current) use of insulin: Secondary | ICD-10-CM

## 2019-02-22 DIAGNOSIS — I5033 Acute on chronic diastolic (congestive) heart failure: Secondary | ICD-10-CM

## 2019-02-22 DIAGNOSIS — I110 Hypertensive heart disease with heart failure: Secondary | ICD-10-CM

## 2019-02-22 MED ORDER — DOXYCYCLINE HYCLATE 100 MG PO CAPS: 100.0000 mg | ORAL_CAPSULE | Freq: Two times a day (BID) | ORAL | 1 refills | Status: DC

## 2019-02-22 NOTE — Progress Notes (Signed)
[facility] Summary Report / Loop Recorder 

## 2019-02-22 NOTE — Progress Notes (Signed)
Office Visit Note   Patient: Travis [name]           Date of Birth: [birth year]           MRN: [id number] Visit Date: 02/22/2019              Requested by: Wendee Beavers, DO [address] [place], [place] [address] PCP: Wendee Beavers, DO  Chief Complaint  Patient presents with  . 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  He previously underwent left fourth and fifth ray amputations several years ago.  He has been offered diabetic foot wear and inserts in the past but does not appear to be utilizing this.  He was evaluated and found to have a ulcer over the lateral left foot. He was recently seen by primary care and referred here for further evaluation of his ulcer.  He has also been referred to vascular surgery for work-up of peripheral vascular disease per the PCP as well for abnormal toe brachial index studies..  This work-up is pending.  Assessment & Plan: Visit Diagnoses:  1. History of diabetic ulcer of foot     Plan: Start doxycycline 100 mg p.o. twice daily.  We will also have him utilize Iodosorb ointment to the ulcerated area daily and cover with dry gauze and Ace wrap to secure.  He will be placed in a postop shoe with a doughnut to offload the ulcerated area.  He was cautioned to elevate and to offload the area and try not to walk on the foot as much as possible.  He was also given information concerning vitamins extra vitamin C zinc, probiotics, and whey protein supplements to facilitate wound healing.  He will follow-up in 2 weeks or sooner should he have difficulties in the interim.  Follow-Up Instructions: Return in about 2 weeks (around 03/08/2019).   Ortho Exam  Patient is alert, oriented, no adenopathy, well-dressed, normal affect, normal respiratory effort. The left foot plantar  ulcer is over the residual metatarsal.  He has severe peripheral neuropathy and has no sensation over the area.  There is no visible bone or tendon but fat layer is exposed.  There are no current signs of periwound cellulitis but he does have some odor with drainage.  There is a biphasic dorsalis pedis and posterior tibialis pulse by Doppler in the office today.  Imaging: Xr Foot Complete Left  Result Date: 02/23/2019 Radiographs of the left foot show the patient to be status post fourth and fifth ray amputations with the proximal third metatarsal still present without evidence of osteomyelitis.  There are significant arthritic changes and claw toe deformity of the residual toes but no evidence of acute fracture or osteomyelitis in any of these toes.  No images are attached to the encounter.  Labs: Lab Results  Component Value Date   HGBA1C 6.9 (H) 12/28/2018   HGBA1C 8.0 (A) 10/23/2018   HGBA1C 6.9 (H) 06/26/2018   ESRSEDRATE 1 11/09/2016   ESRSEDRATE 24 (H) 10/06/2016   CRP 0.6 11/09/2016   CRP 6.3 (H) 10/06/2016   REPTSTATUS 11/03/2018 FINAL 10/29/2018   REPTSTATUS 11/03/2018 FINAL 10/29/2018   CULT  10/29/2018    NO GROWTH 5 DAYS Performed at [facility], [address] [place], [place] [address]  CULT  10/29/2018    NO GROWTH 5 DAYS Performed at [facility], [address] [place], [place] [id number]      Lab Results  Component Value Date   ALBUMIN 4.2 12/28/2018   ALBUMIN 4.4 11/21/2018   ALBUMIN 3.3 (L) 11/02/2018   PREALBUMIN 20.2 11/11/2015    Body mass index is 27.07 kg/m.  Orders:  Orders Placed This Encounter  Procedures  . XR Foot Complete Left   Meds ordered this encounter  Medications  . doxycycline (VIBRAMYCIN) 100 MG capsule    Sig: Take 1 capsule (100 mg total) by mouth 2 (two) times daily.    Dispense:  28 capsule    Refill:  1     Procedures: No procedures performed  Clinical Data: No additional findings.  ROS:  All  other systems negative, except as noted in the HPI. Review of Systems  Objective: Vital Signs: Ht 6\' 2"  (1.88 m)   Wt 210 lb 12.8 oz (95.6 kg)   BMI 27.07 kg/m   Specialty Comments:  No specialty comments available.  PMFS History: Patient Active Problem List   Diagnosis Date Noted  . Pressure injury of left foot, stage 2 (HCC) 02/14/2019  . PVD (peripheral vascular disease) (HCC) 02/14/2019  . Snoring 02/14/2019  . S/P laparoscopic cholecystectomy 01/04/2019  . Intractable vomiting   . Sepsis (HCC) 10/29/2018  . Abnormal finding on imaging 10/29/2018  . Cholecystostomy care (HCC) 09/05/2018  . Essential hypertension 06/27/2018  . Hyperlipidemia 06/27/2018  . Diabetic retinopathy (HCC) 06/27/2018  . Leukocytosis 06/27/2018  . Ischemic stroke (HCC) - mult small B infarcts s/p tPA, unknown embolic source 06/25/2018  . Left arm numbness 03/03/2018  . Strain of right trapezius muscle 03/03/2018  . Ischemic cardiomyopathy 12/16/2017  . Major depressive disorder, recurrent episode, mild (HCC) 09/16/2017  . Acute on chronic systolic and diastolic heart failure, NYHA class 3 (HCC) 09/14/2017  . CAD in native artery 09/14/2017  . S/P BKA (below knee amputation) unilateral, left (HCC) 11/19/2016  . Short Achilles tendon (acquired), left ankle 11/19/2016  . Gastroparesis due to DM (HCC)   . History of diabetic ulcer of foot 11/14/2015  . Vitamin D deficiency 11/12/2015  . Osteoporosis 11/12/2015  . GERD (gastroesophageal reflux disease) 11/03/2015  . Erectile dysfunction 07/11/2009  . Allergic rhinitis 12/11/2008  . 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 Insomnia 08/24/2007  . Controlled type 2 diabetes mellitus with diabetic autonomic neuropathy, with long-term current use of insulin (HCC) 08/09/2007   Past Medical History:  Diagnosis Date  . Acute on chronic systolic and diastolic heart failure, NYHA class 3 (HCC) 09/14/2017   . Acute osteomyelitis of metatarsal bone of left foot (HCC) 11/12/2015  . CAD in native artery 09/14/2017  . Closed fracture of right tibial plateau 11/11/2015  . Depression with anxiety   . Diabetes mellitus    INSULIN DEPENDENT Type II  . Diabetic peripheral neuropathy associated with type 2 diabetes mellitus (HCC) 08/30/2008   Qualifier: Diagnosis of  By: Daphine DeutscherMartin FNP, Zena AmosNykedtra    . Diabetic ulcer of left foot (HCC) 11/14/2015  . Gastroparesis   . GERD (gastroesophageal reflux disease)   . Hypertension   . Left ventricular aneurysm    REPORTS HE IS NOT AWAREW OF THIS   . Osteomyelitis (HCC)   . Osteoporosis 11/12/2015  . Peripheral neuropathy   . Shortness of breath dyspnea    with exertion  . Vitamin D deficiency  11/12/2015    Family History  Problem Relation Age of Onset  . Cancer Paternal Grandmother   . COPD Mother   . Heart failure Mother   . Anxiety disorder Mother   . Depression Mother   . Fibromyalgia Mother   . Esophageal cancer Father        Smoker, still does  . Diabetes Sister   . Fibromyalgia Sister   . Diabetes Maternal Uncle   . Diabetes Maternal Grandmother   . GER disease Sister   . Liver disease Paternal Grandfather   . Hemachromatosis Paternal Grandfather   . Diabetes Maternal Grandfather     Past Surgical History:  Procedure Laterality Date  . AMPUTATION Left 11/28/2015   Procedure: Left 4th Ray Amputation;  Surgeon: Nadara Mustard, MD;  Location: [facility];  Service: Orthopedics;  Laterality: Left;  . AMPUTATION Left 10/29/2016   Procedure: LEFT 5TH RAY AMPUTATION;  Surgeon: Nadara Mustard, MD;  Location: [facility] OR;  Service: Orthopedics;  Laterality: Left;  . ANTERIOR VITRECTOMY Left 04/12/2016   Procedure: ANTERIOR CHAMBER WASH OUT WITH GAS FLUID EXCHANGE;  Surgeon: Carmela Rima, MD;  [facility] OR;  Service: Ophthalmology;  Laterality: Left;  . CARDIAC CATHETERIZATION    . CATARACT EXTRACTION Left   . CHOLECYSTECTOMY N/A 01/04/2019   Procedure:  LAPAROSCOPIC CHOLECYSTECTOMY;  Surgeon: Gaynelle Adu, MD;  Location: [facility] ORS;  Service: General;  Laterality: N/A;  . CORONARY ARTERY BYPASS GRAFT N/A 11/29/2017    LIMA-LAD, SVG-PDA, SVG-CFX  Procedure: CORONARY ARTERY BYPASS GRAFTING (CABG) times three using the left saphaneous vien. harvested endoscopicly and left internal mammary artery.;  Surgeon: Kerin Perna, MD;  Location: [facility] OR;  Service: Open Heart Surgery;  Laterality: N/A;  . EYE SURGERY     left eye surgery 04/2016  . IR CHOLANGIOGRAM EXISTING TUBE  10/17/2018  . IR CHOLANGIOGRAM EXISTING TUBE  10/30/2018  . IR PERC CHOLECYSTOSTOMY  09/05/2018  . KNEE SURGERY Left   . LOOP RECORDER INSERTION N/A 06/28/2018   Procedure: LOOP RECORDER INSERTION;  Surgeon: Duke Salvia, MD;  Location: [facility] INVASIVE CV LAB;  Service: Cardiovascular;  Laterality: N/A;  . RIGHT/LEFT HEART CATH AND CORONARY ANGIOGRAPHY N/A 10/19/2017   Procedure: RIGHT/LEFT HEART CATH AND CORONARY ANGIOGRAPHY;  Surgeon: Swaziland, [name], MD;  Location: [facility] INVASIVE CV LAB;  Service: Cardiovascular;  Laterality: N/A;  . SHOULDER SURGERY Right   . TEE WITHOUT CARDIOVERSION N/A 11/29/2017   Procedure: TRANSESOPHAGEAL ECHOCARDIOGRAM (TEE);  Surgeon: Donata Clay, Theron Arista, MD;  Location: [facility] OR;  Service: Open Heart Surgery;  Laterality: N/A;  . TEE WITHOUT CARDIOVERSION N/A 06/28/2018   Procedure: TRANSESOPHAGEAL ECHOCARDIOGRAM (TEE);  Surgeon: Jake Bathe, MD;  Location: [facility] ENDOSCOPY;  Service: Cardiovascular;  Laterality: N/A;  . TOE AMPUTATION Left 11/28/15   4th toe   Social History   Occupational History  . Occupation: Disabled  Tobacco Use  . Smoking status: Never Smoker  . Smokeless tobacco: Never Used  Substance and Sexual Activity  . Alcohol use: No  . Drug use: No  . Sexual activity: Yes

## 2019-02-23 ENCOUNTER — Encounter (INDEPENDENT_AMBULATORY_CARE_PROVIDER_SITE_OTHER): Payer: Self-pay | Admitting: Physician Assistant

## 2019-02-28 ENCOUNTER — Ambulatory Visit: Payer: Medicare Other | Admitting: Family Medicine

## 2019-03-06 ENCOUNTER — Telehealth (INDEPENDENT_AMBULATORY_CARE_PROVIDER_SITE_OTHER): Payer: Self-pay | Admitting: Orthopedic Surgery

## 2019-03-06 NOTE — Telephone Encounter (Signed)
Patient left a message yesterday stating that he has some questions about the covid-19.  CB#[id number].  Thank you.

## 2019-03-06 NOTE — Telephone Encounter (Signed)
I called and Travis [name]. He advised that his daughter has been to [place] several weeks ago and that when she came back from the trip she was having s/s of COVID-19. He states that no one will test her and can not confirm that she has it but does know that he has been exposed. He is not having any s/s now. Per Dr. Lajoyce Corners the [name] will call and notify once he has arrived in the parking lot and he will come out to see the [name] in his vehicle wearing all PPE.

## 2019-03-08 ENCOUNTER — Ambulatory Visit (INDEPENDENT_AMBULATORY_CARE_PROVIDER_SITE_OTHER): Payer: Medicare Other | Admitting: Orthopedic Surgery

## 2019-03-08 ENCOUNTER — Encounter (HOSPITAL_COMMUNITY): Payer: Self-pay | Admitting: *Deleted

## 2019-03-08 ENCOUNTER — Other Ambulatory Visit: Payer: Self-pay

## 2019-03-08 ENCOUNTER — Ambulatory Visit (INDEPENDENT_AMBULATORY_CARE_PROVIDER_SITE_OTHER): Payer: Self-pay | Admitting: Physician Assistant

## 2019-03-08 ENCOUNTER — Encounter (INDEPENDENT_AMBULATORY_CARE_PROVIDER_SITE_OTHER): Payer: Self-pay | Admitting: Orthopedic Surgery

## 2019-03-08 DIAGNOSIS — L97524 Non-pressure chronic ulcer of other part of left foot with necrosis of bone: Secondary | ICD-10-CM

## 2019-03-08 DIAGNOSIS — E11319 Type 2 diabetes mellitus with unspecified diabetic retinopathy without macular edema: Secondary | ICD-10-CM

## 2019-03-08 DIAGNOSIS — Z794 Long term (current) use of insulin: Secondary | ICD-10-CM

## 2019-03-08 DIAGNOSIS — Z89422 Acquired absence of other left toe(s): Secondary | ICD-10-CM | POA: Diagnosis not present

## 2019-03-08 DIAGNOSIS — I5033 Acute on chronic diastolic (congestive) heart failure: Secondary | ICD-10-CM

## 2019-03-08 DIAGNOSIS — I2510 Atherosclerotic heart disease of native coronary artery without angina pectoris: Secondary | ICD-10-CM

## 2019-03-08 DIAGNOSIS — M86272 Subacute osteomyelitis, left ankle and foot: Secondary | ICD-10-CM | POA: Diagnosis not present

## 2019-03-08 DIAGNOSIS — E1140 Type 2 diabetes mellitus with diabetic neuropathy, unspecified: Secondary | ICD-10-CM

## 2019-03-08 DIAGNOSIS — I110 Hypertensive heart disease with heart failure: Secondary | ICD-10-CM

## 2019-03-08 NOTE — Progress Notes (Signed)
Office Visit Note   Patient: Travis [name]           Date of Birth: 10-20-1971           MRN: [id number] Visit Date: 03/08/2019              Requested by: Wendee Beavers, DO [address] [place], [place] [address] PCP: Wendee Beavers, DO  Chief Complaint  Patient presents with  . Left Foot - Follow-up      HPI: Patient is a 48 year old gentleman who presents in follow-up for ulcer base of the fifth metatarsal left foot.  Patient is status post a partial ray amputation.  Patient denies any fever or chills.    Assessment & Plan: Visit Diagnoses:  1. Subacute osteomyelitis, left ankle and foot (HCC)   2. 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  Recommended proceeding with revision amputation fifth ray left foot.  Risk and benefits were discussed including persistent infection need for additional surgery.  Patient will go ahead and refill his prescription for the antibiotic plan for surgery tomorrow as an outpatient at [facility].  Follow-Up Instructions: Return in about 1 week (around 03/15/2019).   Ortho Exam  Patient is alert, oriented, no adenopathy, well-dressed, normal affect, normal respiratory effort.  Patient has no recent history of fever or chills or viral symptoms. Examination patient is seen at curbside.  Patient has been asymptomatic from the coronavirus and people that he has been associated with did have some symptoms a week ago but no fever or chills and they are feeling better.  Patient has a good pulse he has a worsening ulcer over the base of the fifth metatarsal left foot.  After informed consent a 10 blade knife was used to debride the skin and soft tissue back to bleeding viable granulation tissue.  Silver nitrate was used for hemostasis.  The ulcer now probes down to bone with exposed bone in the ulcerative bed.  There is no purulent drainage no abscess no ascending  cellulitis.  Imaging: No results found.   Labs: Lab Results  Component Value Date   HGBA1C 6.9 (H) 12/28/2018   HGBA1C 8.0 (A) 10/23/2018   HGBA1C 6.9 (H) 06/26/2018   ESRSEDRATE 1 11/09/2016   ESRSEDRATE 24 (H) 10/06/2016   CRP 0.6 11/09/2016   CRP 6.3 (H) 10/06/2016   REPTSTATUS 11/03/2018 FINAL 10/29/2018   REPTSTATUS 11/03/2018 FINAL 10/29/2018   CULT  10/29/2018    NO GROWTH 5 DAYS Performed at [facility], [address], [place], [place] [address]    CULT  10/29/2018    NO GROWTH 5 DAYS Performed at [facility], [address], [place], [place] [address]      Lab Results  Component Value Date   ALBUMIN 4.2 12/28/2018   ALBUMIN 4.4 11/21/2018   ALBUMIN 3.3 (L) 11/02/2018   PREALBUMIN 20.2 11/11/2015    There is no height or weight on file to calculate BMI.  Orders:  No orders of the defined types were placed in this encounter.  No orders of the defined types were placed in this encounter.    Procedures: No procedures performed  Clinical Data: No additional findings.  ROS:  All other systems negative, except as noted in the HPI. Review of Systems  Objective: Vital Signs: There were no vitals taken for this visit.  Specialty Comments:  No specialty comments available.  PMFS History: Patient Active Problem List   Diagnosis Date Noted  . Pressure injury of left foot, stage 2 (HCC) 02/14/2019  . PVD (peripheral vascular disease) (HCC) 02/14/2019  . Snoring 02/14/2019  . S/P laparoscopic cholecystectomy 01/04/2019  . Intractable vomiting   . Sepsis (HCC) 10/29/2018  . Abnormal finding on imaging 10/29/2018  . Cholecystostomy care (HCC) 09/05/2018  . Essential hypertension 06/27/2018  . Hyperlipidemia 06/27/2018  . Diabetic retinopathy (HCC) 06/27/2018  . Leukocytosis 06/27/2018  . Ischemic stroke (HCC) - mult small B infarcts s/p tPA, unknown embolic source 06/25/2018  . Left arm numbness 03/03/2018  . Strain of right  trapezius muscle 03/03/2018  . Ischemic cardiomyopathy 12/16/2017  . Major depressive disorder, recurrent episode, mild (HCC) 09/16/2017  . Acute on chronic systolic and diastolic heart failure, NYHA class 3 (HCC) 09/14/2017  . CAD in native artery 09/14/2017  . S/P BKA (below knee amputation) unilateral, left (HCC) 11/19/2016  . Short Achilles tendon (acquired), left ankle 11/19/2016  . Gastroparesis due to DM (HCC)   . History of diabetic ulcer of foot 11/14/2015  . Vitamin D deficiency 11/12/2015  . Osteoporosis 11/12/2015  . GERD (gastroesophageal reflux disease) 11/03/2015  . Erectile dysfunction 07/11/2009  . Allergic rhinitis 12/11/2008  . 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 Insomnia 08/24/2007  . Controlled type 2 diabetes mellitus with diabetic autonomic neuropathy, with long-term current use of insulin (HCC) 08/09/2007   Past Medical History:  Diagnosis Date  . Acute on chronic systolic and diastolic heart failure, NYHA class 3 (HCC) 09/14/2017  . Acute osteomyelitis of metatarsal bone of left foot (HCC) 11/12/2015  . CAD in native artery 09/14/2017  . Closed fracture of right tibial plateau 11/11/2015  . Depression with anxiety   . Diabetes mellitus    INSULIN DEPENDENT Type II  . Diabetic peripheral neuropathy associated with type 2 diabetes mellitus (HCC) 08/30/2008   Qualifier: Diagnosis of  By: Daphine DeutscherMartin FNP, Zena AmosNykedtra    . Diabetic ulcer of left foot (HCC) 11/14/2015  . Gastroparesis   . GERD (gastroesophageal reflux disease)   . Hypertension   . Left ventricular aneurysm    REPORTS HE IS NOT AWAREW OF THIS   . Osteomyelitis (HCC)   . Osteoporosis 11/12/2015  . Peripheral neuropathy   . Shortness of breath dyspnea    with exertion  . Vitamin D deficiency 11/12/2015    Family History  Problem Relation Age of Onset  . Cancer Paternal Grandmother   . COPD Mother   . Heart failure Mother   . Anxiety disorder  Mother   . Depression Mother   . Fibromyalgia Mother   . Esophageal cancer Father        Smoker, still does  . Diabetes Sister   . Fibromyalgia Sister   . Diabetes Maternal Uncle   . Diabetes Maternal Grandmother   . GER disease Sister   . Liver disease Paternal Grandfather   . Hemachromatosis Paternal Grandfather   . Diabetes Maternal Grandfather     Past Surgical History:  Procedure Laterality Date  . AMPUTATION Left 11/28/2015   Procedure: Left 4th Ray Amputation;  Surgeon: Nadara MustardMarcus Duda V, MD;  [facility];  Service: Orthopedics;  Laterality: Left;  . AMPUTATION Left 10/29/2016   Procedure: LEFT 5TH RAY AMPUTATION;  Surgeon: Nadara MustardMarcus V Duda, MD;  Location: [facility] OR;  Service: Orthopedics;  Laterality: Left;  . ANTERIOR VITRECTOMY Left  04/12/2016   Procedure: ANTERIOR CHAMBER WASH OUT WITH GAS FLUID EXCHANGE;  Surgeon: Carmela Rima, MD;  Location: [facility] OR;  Service: Ophthalmology;  Laterality: Left;  . CARDIAC CATHETERIZATION    . CATARACT EXTRACTION Left   . CHOLECYSTECTOMY N/A 01/04/2019   Procedure: LAPAROSCOPIC CHOLECYSTECTOMY;  Surgeon: Gaynelle Adu, MD;  Location: [facility] ORS;  Service: General;  Laterality: N/A;  . CORONARY ARTERY BYPASS GRAFT N/A 11/29/2017    LIMA-LAD, SVG-PDA, SVG-CFX  Procedure: CORONARY ARTERY BYPASS GRAFTING (CABG) times three using the left saphaneous vien. harvested endoscopicly and left internal mammary artery.;  Surgeon: Kerin Perna, MD;  Location: [facility] OR;  Service: Open Heart Surgery;  Laterality: N/A;  . EYE SURGERY     left eye surgery 04/2016  . IR CHOLANGIOGRAM EXISTING TUBE  10/17/2018  . IR CHOLANGIOGRAM EXISTING TUBE  10/30/2018  . IR PERC CHOLECYSTOSTOMY  09/05/2018  . KNEE SURGERY Left   . LOOP RECORDER INSERTION N/A 06/28/2018   Procedure: LOOP RECORDER INSERTION;  Surgeon: Duke Salvia, MD;  Location: [facility] INVASIVE CV LAB;  Service: Cardiovascular;  Laterality: N/A;  . RIGHT/LEFT HEART CATH AND CORONARY ANGIOGRAPHY N/A 10/19/2017   Procedure:  RIGHT/LEFT HEART CATH AND CORONARY ANGIOGRAPHY;  Surgeon: Swaziland, [name], MD;  Location: [facility] INVASIVE CV LAB;  Service: Cardiovascular;  Laterality: N/A;  . SHOULDER SURGERY Right   . TEE WITHOUT CARDIOVERSION N/A 11/29/2017   Procedure: TRANSESOPHAGEAL ECHOCARDIOGRAM (TEE);  Surgeon: Donata Clay, Theron Arista, MD;  Location: [facility] OR;  Service: Open Heart Surgery;  Laterality: N/A;  . TEE WITHOUT CARDIOVERSION N/A 06/28/2018   Procedure: TRANSESOPHAGEAL ECHOCARDIOGRAM (TEE);  Surgeon: Jake Bathe, MD;  Location: [facility] ENDOSCOPY;  Service: Cardiovascular;  Laterality: N/A;  . TOE AMPUTATION Left 11/28/15   4th toe   Social History   Occupational History  . Occupation: Disabled  Tobacco Use  . Smoking status: Never Smoker  . Smokeless tobacco: Never Used  Substance and Sexual Activity  . Alcohol use: No  . Drug use: No  . Sexual activity: Yes

## 2019-03-08 NOTE — Progress Notes (Signed)
Spoke with patient regarding his pre-op instructions for DOS.  Patient denies SOB, chest pain, fever, congestion, n/v.  Patient has a dry cough for years.  Dry cough is not a new symptom.  Patient instructed as of now to stop taking all vitamins, supplements, Ibuprofen/NSAIDS, aleve, motrin, Goody's, BC powder until after surgery.  May take tylenol if needed.  Dr. Lajoyce Corners did not tell patient to stop aspirin or plavix prior to surgery.  Last dose for both medications was 03/08/2019.  Patient was informed of the Hospital's Visitation Restriction Policy that is now in effect.  Patient's Sister [name] will drop off and pick up when d/c from hospital.  PCP - Abelardo Diesel Cardiologist - Graciela Husbands  Chest x-ray - 10/29/18 EKG - 10/29/18 Stress Test - denies ECHO - 06/28/18 Cardiac Cath - 10/2017  Sleep Study - denies CPAP - n/a  Fasting Blood Sugar - 110-120  . Do not take Metformin the morning of surgery.  . THE NIGHT BEFORE SURGERY, take 10 units of Levemir insulin.     o Check your blood sugar the morning of your surgery when you wake up and every 2 hours until you get to the Short Stay unit.  . If your blood sugar is less than 70 mg/dL, you will need to treat for low blood sugar: o Treat a low blood sugar (less than 70 mg/dL) with  cup of clear juice (cranberry or apple) o Recheck blood sugar in 15 minutes after treatment (to make sure it is greater than 70 mg/dL). If your blood sugar is not greater than 70 mg/dL on recheck, call [phone] for further instructions.  . Report your blood sugar to the short stay nurse when you get to Short Stay.   Coronavirus Screening  Have you or your sister [name] experienced the following symptoms:  Cough yes/no: No for sister [name] but patient has had a dry cough for years.  This is not a new symptom. Fever (>100.8F)  yes/no: No Runny nose yes/no: No Sore throat yes/no: No Difficulty breathing/shortness of breath  yes/no: No  Congestion - no N/V-  no  Have you or your sister [name] traveled in the last 14 days and where? yes/no: No  Patient verbalized understanding of all pre-op instructions for DOS.

## 2019-03-09 ENCOUNTER — Encounter (HOSPITAL_COMMUNITY)
Admission: RE | Disposition: A | Discharge status: Home / Self Care | Payer: Self-pay | Source: Home / Self Care | Attending: Orthopedic Surgery

## 2019-03-09 ENCOUNTER — Telehealth (INDEPENDENT_AMBULATORY_CARE_PROVIDER_SITE_OTHER): Payer: Self-pay

## 2019-03-09 ENCOUNTER — Encounter (HOSPITAL_COMMUNITY): Payer: Self-pay

## 2019-03-09 ENCOUNTER — Other Ambulatory Visit: Payer: Self-pay

## 2019-03-09 ENCOUNTER — Ambulatory Visit (HOSPITAL_COMMUNITY): Payer: Medicare Other | Admitting: Anesthesiology

## 2019-03-09 ENCOUNTER — Ambulatory Visit (HOSPITAL_COMMUNITY)
Admission: RE | Admit: 2019-03-09 | Discharge: 2019-03-09 | Disposition: A | Discharge status: Home / Self Care | Payer: Medicare Other | Attending: Orthopedic Surgery | Admitting: Orthopedic Surgery

## 2019-03-09 DIAGNOSIS — I5032 Chronic diastolic (congestive) heart failure: Secondary | ICD-10-CM | POA: Diagnosis not present

## 2019-03-09 DIAGNOSIS — K3184 Gastroparesis: Secondary | ICD-10-CM | POA: Insufficient documentation

## 2019-03-09 DIAGNOSIS — E1143 Type 2 diabetes mellitus with diabetic autonomic (poly)neuropathy: Secondary | ICD-10-CM | POA: Diagnosis not present

## 2019-03-09 DIAGNOSIS — I2510 Atherosclerotic heart disease of native coronary artery without angina pectoris: Secondary | ICD-10-CM | POA: Insufficient documentation

## 2019-03-09 DIAGNOSIS — E1140 Type 2 diabetes mellitus with diabetic neuropathy, unspecified: Secondary | ICD-10-CM | POA: Insufficient documentation

## 2019-03-09 DIAGNOSIS — I110 Hypertensive heart disease with heart failure: Secondary | ICD-10-CM | POA: Diagnosis not present

## 2019-03-09 DIAGNOSIS — E1142 Type 2 diabetes mellitus with diabetic polyneuropathy: Secondary | ICD-10-CM | POA: Insufficient documentation

## 2019-03-09 DIAGNOSIS — E1151 Type 2 diabetes mellitus with diabetic peripheral angiopathy without gangrene: Secondary | ICD-10-CM | POA: Diagnosis not present

## 2019-03-09 DIAGNOSIS — K219 Gastro-esophageal reflux disease without esophagitis: Secondary | ICD-10-CM | POA: Diagnosis not present

## 2019-03-09 DIAGNOSIS — E1169 Type 2 diabetes mellitus with other specified complication: Secondary | ICD-10-CM | POA: Diagnosis not present

## 2019-03-09 DIAGNOSIS — M86272 Subacute osteomyelitis, left ankle and foot: Secondary | ICD-10-CM | POA: Diagnosis not present

## 2019-03-09 DIAGNOSIS — Z89432 Acquired absence of left foot: Secondary | ICD-10-CM | POA: Insufficient documentation

## 2019-03-09 DIAGNOSIS — Z950 Presence of cardiac pacemaker: Secondary | ICD-10-CM | POA: Insufficient documentation

## 2019-03-09 DIAGNOSIS — Z794 Long term (current) use of insulin: Secondary | ICD-10-CM | POA: Insufficient documentation

## 2019-03-09 DIAGNOSIS — Z951 Presence of aortocoronary bypass graft: Secondary | ICD-10-CM | POA: Insufficient documentation

## 2019-03-09 DIAGNOSIS — M795 Residual foreign body in soft tissue: Secondary | ICD-10-CM

## 2019-03-09 DIAGNOSIS — M869 Osteomyelitis, unspecified: Secondary | ICD-10-CM | POA: Insufficient documentation

## 2019-03-09 DIAGNOSIS — Z8673 Personal history of transient ischemic attack (TIA), and cerebral infarction without residual deficits: Secondary | ICD-10-CM | POA: Diagnosis not present

## 2019-03-09 HISTORY — DX: Peripheral vascular disease, unspecified: I73.9

## 2019-03-09 HISTORY — PX: AMPUTATION: SHX166

## 2019-03-09 HISTORY — DX: Atherosclerotic heart disease of native coronary artery without angina pectoris: I25.10

## 2019-03-09 HISTORY — DX: Other specified postprocedural states: Z98.890

## 2019-03-09 HISTORY — DX: Cerebral infarction, unspecified: I63.9

## 2019-03-09 LAB — BASIC METABOLIC PANEL
Anion gap: 11 (ref 5–15)
BUN: 12 mg/dL (ref 6–20)
CO2: 21 mmol/L — ABNORMAL LOW (ref 22–32)
CREATININE: 0.87 mg/dL (ref 0.61–1.24)
Calcium: 9.2 mg/dL (ref 8.9–10.3)
Chloride: 101 mmol/L (ref 98–111)
GFR calc Af Amer: >60 mL/min (ref >60)
GFR calc non Af Amer: >60 mL/min (ref >60)
Glucose, Bld: 268 mg/dL — ABNORMAL HIGH (ref 70–99)
Potassium: 4.2 mmol/L (ref 3.5–5.1)
SODIUM: 133 mmol/L — AB (ref 135–145)

## 2019-03-09 LAB — CBC
HCT: 40.8 % (ref 39.0–52.0)
Hemoglobin: 13.7 g/dL (ref 13.0–17.0)
MCH: 29.7 pg (ref 26.0–34.0)
MCHC: 33.6 g/dL (ref 30.0–36.0)
MCV: 88.5 fL (ref 80.0–100.0)
Platelets: 275 10*3/uL (ref 150–400)
RBC: 4.61 MIL/uL (ref 4.22–5.81)
RDW: 13.1 % (ref 11.5–15.5)
WBC: 9.7 10*3/uL (ref 4.0–10.5)
nRBC: 0 % (ref 0.0–0.2)

## 2019-03-09 LAB — GLUCOSE, CAPILLARY
GLUCOSE-CAPILLARY: 205 mg/dL — AB (ref 70–99)
Glucose-Capillary: 233 mg/dL — ABNORMAL HIGH (ref 70–99)

## 2019-03-09 SURGERY — AMPUTATION, FOOT, RAY
Anesthesia: Monitor Anesthesia Care | Laterality: Left

## 2019-03-09 MED ORDER — PROPOFOL 10 MG/ML IV BOLUS
INTRAVENOUS | Status: DC | PRN
  Administered 2019-03-09 (×2): 20 mg via INTRAVENOUS

## 2019-03-09 MED ORDER — MIDAZOLAM HCL 2 MG/2ML IJ SOLN
INTRAMUSCULAR | Status: AC
  Filled 2019-03-09: qty 2

## 2019-03-09 MED ORDER — OXYCODONE HCL 5 MG PO TABS: 5.0000 mg | ORAL_TABLET | Freq: Once | ORAL | Status: DC | PRN

## 2019-03-09 MED ORDER — DEXAMETHASONE SODIUM PHOSPHATE 10 MG/ML IJ SOLN
INTRAMUSCULAR | Status: AC
  Filled 2019-03-09: qty 1

## 2019-03-09 MED ORDER — LACTATED RINGERS IV SOLN
INTRAVENOUS | Status: DC
  Administered 2019-03-09: 10:00:00 via INTRAVENOUS

## 2019-03-09 MED ORDER — ONDANSETRON HCL 4 MG/2ML IJ SOLN
INTRAMUSCULAR | Status: DC | PRN
  Administered 2019-03-09: 4 mg via INTRAVENOUS

## 2019-03-09 MED ORDER — PHENYLEPHRINE 40 MCG/ML (10ML) SYRINGE FOR IV PUSH (FOR BLOOD PRESSURE SUPPORT)
PREFILLED_SYRINGE | INTRAVENOUS | Status: DC | PRN
  Administered 2019-03-09 (×2): 80 ug via INTRAVENOUS

## 2019-03-09 MED ORDER — FENTANYL CITRATE (PF) 100 MCG/2ML IJ SOLN: 25.0000 ug | INTRAMUSCULAR | Status: DC | PRN

## 2019-03-09 MED ORDER — FENTANYL CITRATE (PF) 250 MCG/5ML IJ SOLN
INTRAMUSCULAR | Status: AC
  Filled 2019-03-09: qty 5

## 2019-03-09 MED ORDER — ONDANSETRON HCL 4 MG/2ML IJ SOLN
INTRAMUSCULAR | Status: AC
  Filled 2019-03-09: qty 4

## 2019-03-09 MED ORDER — SUCCINYLCHOLINE CHLORIDE 200 MG/10ML IV SOSY
PREFILLED_SYRINGE | INTRAVENOUS | Status: AC
  Filled 2019-03-09: qty 30

## 2019-03-09 MED ORDER — CHLORHEXIDINE GLUCONATE 4 % EX LIQD: 60.0000 mL | Freq: Once | CUTANEOUS | Status: DC

## 2019-03-09 MED ORDER — LIDOCAINE 2% (20 MG/ML) 5 ML SYRINGE
INTRAMUSCULAR | Status: AC
  Filled 2019-03-09: qty 15

## 2019-03-09 MED ORDER — CEFAZOLIN SODIUM-DEXTROSE 2-4 GM/100ML-% IV SOLN
2.0000 g | INTRAVENOUS | Status: AC
  Administered 2019-03-09: 2 g via INTRAVENOUS
  Filled 2019-03-09: qty 100

## 2019-03-09 MED ORDER — MIDAZOLAM HCL 5 MG/5ML IJ SOLN
INTRAMUSCULAR | Status: DC | PRN
  Administered 2019-03-09: 2 mg via INTRAVENOUS

## 2019-03-09 MED ORDER — PROPOFOL 500 MG/50ML IV EMUL
INTRAVENOUS | Status: DC | PRN
  Administered 2019-03-09: 100 ug/kg/min via INTRAVENOUS

## 2019-03-09 MED ORDER — ONDANSETRON HCL 4 MG/2ML IJ SOLN: 4.0000 mg | Freq: Once | INTRAMUSCULAR | Status: DC | PRN

## 2019-03-09 MED ORDER — OXYCODONE-ACETAMINOPHEN 5-325 MG PO TABS: 1.0000 | ORAL_TABLET | ORAL | 0 refills | Status: DC | PRN

## 2019-03-09 MED ORDER — PROPOFOL 1000 MG/100ML IV EMUL
INTRAVENOUS | Status: AC
  Filled 2019-03-09: qty 100

## 2019-03-09 MED ORDER — ROCURONIUM BROMIDE 50 MG/5ML IV SOSY
PREFILLED_SYRINGE | INTRAVENOUS | Status: AC
  Filled 2019-03-09: qty 10

## 2019-03-09 MED ORDER — 0.9 % SODIUM CHLORIDE (POUR BTL) OPTIME
TOPICAL | Status: DC | PRN
  Administered 2019-03-09: 1000 mL

## 2019-03-09 MED ORDER — FENTANYL CITRATE (PF) 100 MCG/2ML IJ SOLN
INTRAMUSCULAR | Status: DC | PRN
  Administered 2019-03-09: 50 ug via INTRAVENOUS

## 2019-03-09 MED ORDER — LIDOCAINE 2% (20 MG/ML) 5 ML SYRINGE
INTRAMUSCULAR | Status: DC | PRN
  Administered 2019-03-09: 60 mg via INTRAVENOUS

## 2019-03-09 MED ORDER — OXYCODONE HCL 5 MG/5ML PO SOLN: 5.0000 mg | Freq: Once | ORAL | Status: DC | PRN

## 2019-03-09 SURGICAL SUPPLY — 30 items

## 2019-03-09 NOTE — Anesthesia Preprocedure Evaluation (Signed)
Anesthesia Evaluation  Patient identified by MRN, date of birth, ID band Patient awake    Reviewed: Allergy & Precautions, NPO status , Patient's Chart, lab work & pertinent test results, reviewed documented beta blocker date and time   Airway Mallampati: II  TM Distance: >3 FB Neck ROM: Full    Dental  (+) Teeth Intact   Pulmonary    Pulmonary exam normal        Cardiovascular hypertension, Pt. on medications and Pt. on home beta blockers + CAD and + CABG  Normal cardiovascular exam+ pacemaker  Rhythm:Regular Rate:Normal     Neuro/Psych Anxiety Depression CVA    GI/Hepatic GERD  Medicated,  Endo/Other  diabetes, Type 2, Insulin Dependent, Oral Hypoglycemic Agents  Renal/GU      Musculoskeletal negative musculoskeletal ROS (+)   Abdominal   Peds  Hematology negative hematology ROS (+)   Anesthesia Other Findings No interval change in overall health since anesthetic in January  Reproductive/Obstetrics                             Lab Results  Component Value Date   WBC 9.7 03/09/2019   HGB 13.7 03/09/2019   HCT 40.8 03/09/2019   MCV 88.5 03/09/2019   PLT 275 03/09/2019   Lab Results  Component Value Date   CREATININE 0.95 12/28/2018   BUN 13 12/28/2018   NA 137 12/28/2018   K 4.6 12/28/2018   CL 102 12/28/2018   CO2 25 12/28/2018   Lab Results  Component Value Date   INR 0.95 12/28/2018   INR 1.21 10/30/2018   INR 1.20 10/29/2018   Echo: - Left ventricle: Systolic function was moderately reduced. The   estimated ejection fraction was in the range of 35% to 40%. - Aortic valve: No evidence of vegetation. - Mitral valve: No evidence of vegetation. There was mild   regurgitation. - Left atrium: No evidence of thrombus in the atrial cavity or   appendage. No evidence of thrombus in the appendage. - Right atrium: No evidence of thrombus in the atrial cavity or    appendage. - Atrial septum: Echo contrast study showed no right-to-left atrial   level shunt, at baseline or with provocation. - Tricuspid valve: No evidence of vegetation. - Pulmonic valve: No evidence of vegetation. - Superior vena cava: The study excluded a thrombus.  Anesthesia Physical  Anesthesia Plan  ASA: III  Anesthesia Plan: MAC   Post-op Pain Management:    Induction: Intravenous  PONV Risk Score and Plan: 2 and Propofol infusion and Treatment may vary due to age or medical condition  Airway Management Planned: Simple Face Mask  Additional Equipment: None  Intra-op Plan:   Post-operative Plan:   Informed Consent: I have reviewed the patients History and Physical, chart, labs and discussed the procedure including the risks, benefits and alternatives for the proposed anesthesia with the patient or authorized representative who has indicated his/her understanding and acceptance.       Plan Discussed with:   Anesthesia Plan Comments: (See PST note 12/28/18, Konrad Felix, PA-C)        Anesthesia Quick Evaluation

## 2019-03-09 NOTE — H&P (Signed)
Travis [name] is an 48 y.o. male.   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  He has been treated conservatively with local debridement and care as well as oral antibiotic therapy but the wound now tracks to the bone.  The patient is admitted for amputation of the residual left fifth ray.  Past Medical History:  Diagnosis Date  . Acute on chronic systolic and diastolic heart failure, NYHA class 3 (HCC) 09/14/2017  . Acute osteomyelitis of metatarsal bone of left foot (HCC) 11/12/2015  . CAD in native artery 09/14/2017  . Closed fracture of right tibial plateau 11/11/2015  . Coronary artery disease   . Depression with anxiety   . Diabetes mellitus    INSULIN DEPENDENT Type II  . Diabetic peripheral neuropathy associated with type 2 diabetes mellitus (HCC) 08/30/2008   Qualifier: Diagnosis of  By: Daphine Deutscher FNP, Zena Amos    . Diabetic ulcer of left foot (HCC) 11/14/2015  . Gastroparesis   . GERD (gastroesophageal reflux disease)   . History of loop recorder 06/2018   Last remote device check was on 02/14/2019  . Hypertension    patient denies  . Left ventricular aneurysm    REPORTS HE IS NOT AWAREW OF THIS   . Osteomyelitis (HCC)   . Osteoporosis 11/12/2015  . Peripheral neuropathy    feet and below bilateral knees  . Peripheral vascular disease (HCC)    PAD in Left leg and some in right leg  . Shortness of breath dyspnea   . Stroke (HCC) 05/2018  . Vitamin D deficiency 11/12/2015    Past Surgical History:  Procedure Laterality Date  . AMPUTATION Left 11/28/2015   Procedure: Left 4th Ray Amputation;  Surgeon: Nadara Mustard, MD;  Location: [facility] OR;  Service: Orthopedics;  Laterality: Left;  . AMPUTATION Left 10/29/2016   Procedure: LEFT 5TH RAY AMPUTATION;  Surgeon: Nadara Mustard, MD;   Location: [facility] OR;  Service: Orthopedics;  Laterality: Left;  . ANTERIOR VITRECTOMY Left 04/12/2016   Procedure: ANTERIOR CHAMBER WASH OUT WITH GAS FLUID EXCHANGE;  Surgeon: Carmela Rima, MD;  Location: [facility] OR;  Service: Ophthalmology;  Laterality: Left;  . CARDIAC CATHETERIZATION  10/2017  . CATARACT EXTRACTION Left   . CHOLECYSTECTOMY N/A 01/04/2019   Procedure: LAPAROSCOPIC CHOLECYSTECTOMY;  Surgeon: Gaynelle Adu, MD;  Location: [facility] ORS;  Service: General;  Laterality: N/A;  . CORONARY ARTERY BYPASS GRAFT N/A 11/29/2017    LIMA-LAD, SVG-PDA, SVG-CFX  Procedure: CORONARY ARTERY BYPASS GRAFTING (CABG) times three using the left saphaneous vien. harvested endoscopicly and left internal mammary artery.;  Surgeon: Kerin Perna, MD;  Location: [facility] OR;  Service: Open Heart Surgery;  Laterality: N/A;  . EYE SURGERY     left eye surgery 04/2016  . IR CHOLANGIOGRAM EXISTING TUBE  10/17/2018  . IR CHOLANGIOGRAM EXISTING TUBE  10/30/2018  . IR PERC CHOLECYSTOSTOMY  09/05/2018  . KNEE SURGERY Left   . LOOP RECORDER INSERTION N/A 06/28/2018   Procedure: LOOP RECORDER INSERTION;  Surgeon: Duke Salvia, MD;  Location: [facility] INVASIVE CV LAB;  Service: Cardiovascular;  Laterality: N/A;  . RIGHT/LEFT HEART CATH AND CORONARY ANGIOGRAPHY N/A 10/19/2017   Procedure: RIGHT/LEFT HEART CATH AND CORONARY ANGIOGRAPHY;  Surgeon: Swaziland, [name], MD;  Location: [facility] INVASIVE CV LAB;  Service: Cardiovascular;  Laterality: N/A;  . SHOULDER SURGERY  Right   . TEE WITHOUT CARDIOVERSION N/A 11/29/2017   Procedure: TRANSESOPHAGEAL ECHOCARDIOGRAM (TEE);  Surgeon: Donata Clay, Theron Arista, MD;  Location: [facility] OR;  Service: Open Heart Surgery;  Laterality: N/A;  . TEE WITHOUT CARDIOVERSION N/A 06/28/2018   Procedure: TRANSESOPHAGEAL ECHOCARDIOGRAM (TEE);  Surgeon: Jake Bathe, MD;  Location: [facility] ENDOSCOPY;  Service: Cardiovascular;  Laterality: N/A;  . TOE AMPUTATION Left 11/28/15   4th toe    Family History  Problem Relation Age of Onset   . Cancer Paternal Grandmother   . COPD Mother   . Heart failure Mother   . Anxiety disorder Mother   . Depression Mother   . Fibromyalgia Mother   . Esophageal cancer Father        Smoker, still does  . Diabetes Sister   . Fibromyalgia Sister   . Diabetes Maternal Uncle   . Diabetes Maternal Grandmother   . GER disease Sister   . Liver disease Paternal Grandfather   . Hemachromatosis Paternal Grandfather   . Diabetes Maternal Grandfather    Social History:  reports that he has never smoked. He has never used smokeless tobacco. He reports that he does not drink alcohol or use drugs.  Allergies: No Known Allergies  No medications prior to admission.    No results found for this or any previous visit (from the past 48 hour(s)). No results found.  ROS  Height 6\' 2"  (1.88 m), weight 96.2 kg. Physical Exam  Constitutional: He is oriented to person, place, and time. He appears well-developed and well-nourished. No distress.  HENT:  Head: Normocephalic and atraumatic.  Neck: No tracheal deviation present. No thyromegaly present.  Cardiovascular: Normal rate.  Respiratory: Effort normal. No stridor.  GI: Soft.  Musculoskeletal:     Comments: Patient has a good pulse he has a worsening ulcer over the base of the fifth metatarsal left foot.  After informed consent a 10 blade knife was used to debride the skin and soft tissue back to bleeding viable granulation tissue.  Silver nitrate was used for hemostasis.  The ulcer now probes down to bone with exposed bone in the ulcerative bed.  There is no purulent drainage no abscess no ascending cellulitis.  Neurological: He is alert and oriented to person, place, and time. No cranial nerve deficit. Coordination normal.  Skin: Skin is warm.  Psychiatric: He has a normal mood and affect. His behavior is normal. Judgment and thought content normal.     Assessment/Plan Left foot fifth ray ulcer/osteomyelitis-plan amputation of residual left  fifth ray.  The procedure and risk and benefits were discussed with the patient including the risk of infection, bleeding, neurovascular injury, and possible need for further surgery were discussed with the patient and the patient wishes to proceed at this time.  [name], PA-C 03/09/2019, 7:13 AM [facility] [phone]

## 2019-03-09 NOTE — Anesthesia Procedure Notes (Signed)
Date/Time: 03/09/2019 12:35 PM Performed by: Laruth Bouchard., CRNA Pre-anesthesia Checklist: Patient identified, Emergency Drugs available, Suction available, Patient being monitored and Timeout performed Patient Re-evaluated:Patient Re-evaluated prior to induction Oxygen Delivery Method: Simple face mask Preoxygenation: Pre-oxygenation with 100% oxygen Induction Type: IV induction Placement Confirmation: positive ETCO2

## 2019-03-09 NOTE — Telephone Encounter (Signed)
Patient's sister [name] called stating that patient had surgery with Dr. Lajoyce Corners today on his left foot and his left foot was bleeding from under the bandage that was placed on his foot.  Talked with Shawn R., Dr. Audrie Lia PA and advised her of message above.  Per Sunday Spillers., patient's sister could take current bandage off and wrap his left foot with some gauze and an ace wrap or patient could come to the office. Advised patient's sister [name] of message above per Sunday Spillers  [name] stated that she would call back if they decided to come.

## 2019-03-09 NOTE — Op Note (Signed)
03/09/2019  1:04 PM  PATIENT:  Travis [name]    PRE-OPERATIVE DIAGNOSIS:  Osteomyelitis, Ulcer Left Foot  POST-OPERATIVE DIAGNOSIS:  Same  PROCEDURE:  LEFT FOOT 5TH RAY AMPUTATION Local tissue rearrangement for wound closure 3 x 7 cm. Removal of deep foreign body. Application of Praveena wound VAC.  SURGEON:  Nadara Mustard, MD  PHYSICIAN ASSISTANT:None ANESTHESIA:   General  PREOPERATIVE INDICATIONS:  Travis [name] is a  48 y.o. male with a diagnosis of Osteomyelitis, Ulcer Left Foot who failed conservative measures and elected for surgical management.    The risks benefits and alternatives were discussed with the patient preoperatively including but not limited to the risks of infection, bleeding, nerve injury, cardiopulmonary complications, the need for revision surgery, among others, and the patient was willing to proceed.  OPERATIVE IMPLANTS: Praveena wound VAC.  @ENCIMAGES @  OPERATIVE FINDINGS: Patient had a 2 cm long piece of wood that was deep within the wound.  There is no abscess at the resection margins.  OPERATIVE PROCEDURE: Patient was brought the operating room underwent a general anesthetic.  After adequate levels anesthesia were obtained patient's left lower extremity was prepped using DuraPrep draped into a sterile field a timeout was called.  Elliptical incision was made around the ulcerative tissue this left a wound that was 7 x 3 cm.  The fifth metatarsal was resected through the base leaving the attachment to the peroneus brevis.  The infected bone was resected in one block of tissue.  Deep within the wound there was a piece of wood that was approximately 3 mm x 2 cm.  This was removed without complications a soft tissue around it was debrided.  The wound was irrigated with normal saline electrocautery was used for hemostasis.  Local tissue rearrangement was used to close the wound that was 3 x 7 cm.  A Praveena wound VAC was applied to set a good suction fit  patient was extubated taken the PACU in stable condition.   DISCHARGE PLANNING:  Antibiotic duration: Preoperative antibiotics  Weightbearing: Touchdown weightbearing on the left  Pain medication: Prescription sent for Percocet  Dressing care/ Wound VAC: Continue wound VAC for 1 week  Ambulatory devices: Walker  Discharge to: Home.  Follow-up: In the office 1 week post operative.

## 2019-03-10 NOTE — Anesthesia Postprocedure Evaluation (Signed)
Anesthesia Post Note  Patient: Travis [name]  Procedure(s) Performed: LEFT FOOT 5TH RAY AMPUTATION (Left )     Patient location during evaluation: PACU Anesthesia Type: MAC Level of consciousness: awake and alert Pain management: pain level controlled Vital Signs Assessment: post-procedure vital signs reviewed and stable Respiratory status: spontaneous breathing, nonlabored ventilation and respiratory function stable Cardiovascular status: blood pressure returned to baseline and stable Postop Assessment: no apparent nausea or vomiting Anesthetic complications: no    Last Vitals:  Vitals:   03/09/19 1323 03/09/19 1354  BP: 108/73 131/74  Pulse: 74 76  Resp: 16 17  Temp:  36.5 C  SpO2: 97% 99%    Last Pain:  Vitals:   03/09/19 1354  TempSrc:   PainSc: 0-No pain   Pain Goal:                   Lidia Collum

## 2019-03-12 ENCOUNTER — Encounter (HOSPITAL_COMMUNITY): Payer: Self-pay | Admitting: Orthopedic Surgery

## 2019-03-12 NOTE — Transfer of Care (Signed)
Immediate Anesthesia Transfer of Care Note  Patient: Travis [name]  Procedure(s) Performed: LEFT FOOT 5TH RAY AMPUTATION (Left )  Patient Location: PACU  Anesthesia Type:MAC  Level of Consciousness: awake, alert  and oriented  Airway & Oxygen Therapy: Patient Spontanous Breathing and Patient connected to face mask oxygen  Post-op Assessment: Report given to RN and Post -op Vital signs reviewed and stable  Post vital signs: Reviewed and stable  Last Vitals:  Vitals Value Taken Time  BP    Temp    Pulse    Resp    SpO2      Last Pain:  Vitals:   03/09/19 1354  TempSrc:   PainSc: 0-No pain         Complications: No apparent anesthesia complications

## 2019-03-15 ENCOUNTER — Telehealth (INDEPENDENT_AMBULATORY_CARE_PROVIDER_SITE_OTHER): Payer: Self-pay | Admitting: Radiology

## 2019-03-15 NOTE — Telephone Encounter (Signed)
Called and left voicemail asking patient to call us back to answer pre screening questions for appointment on 4/3 

## 2019-03-16 ENCOUNTER — Encounter (INDEPENDENT_AMBULATORY_CARE_PROVIDER_SITE_OTHER): Payer: Self-pay | Admitting: Physician Assistant

## 2019-03-16 ENCOUNTER — Other Ambulatory Visit: Payer: Self-pay

## 2019-03-16 ENCOUNTER — Ambulatory Visit (INDEPENDENT_AMBULATORY_CARE_PROVIDER_SITE_OTHER): Payer: Medicare Other | Admitting: Physician Assistant

## 2019-03-16 DIAGNOSIS — I255 Ischemic cardiomyopathy: Secondary | ICD-10-CM

## 2019-03-16 DIAGNOSIS — E1142 Type 2 diabetes mellitus with diabetic polyneuropathy: Secondary | ICD-10-CM

## 2019-03-16 DIAGNOSIS — Z89422 Acquired absence of other left toe(s): Secondary | ICD-10-CM

## 2019-03-16 DIAGNOSIS — Z794 Long term (current) use of insulin: Secondary | ICD-10-CM

## 2019-03-16 NOTE — Progress Notes (Signed)
Office Visit Note   Patient: Travis [name]           Date of Birth: July 31, 1971           MRN: [id number] Visit Date: 03/16/2019              Requested by: Wendee Beavers, DO [address] [place], [place] [address] PCP: Wendee Beavers, DO  Chief Complaint  Patient presents with  . Left Foot - Pain, Routine Post Op      HPI: The patient is a 48 yo gentleman with diabetic insensate neuropathy who is seen for post operative follow up following left foot 5th ray amputation for osteomyelitis on 03/09/2019.  He has had the prevena VAC in place for the past week. He initially had some bloody drainage from underneath the VAC drape, but report this stopped the evening of surgery. He continues on Doxycycline. Ambulates with a walker and post op shoe. He has been non weight bearing and elevating .   Assessment & Plan: Visit Diagnoses:  1. 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 Type 2 diabetes mellitus with diabetic polyneuropathy, with long-term current use of insulin (HCC)   3. Ischemic cardiomyopathy     Plan: Prevena VAC was removed and the area cleaned with saline. They were instructed to clean the area daily in the shower with Dial or antibacterial soap and then apply dry gauze and Ace wrap to secure. Continue to off load and elevated as much as possible. Given written information for protein supplements, probiotic and vitamins.  Follow up in 1 week.   Follow-Up Instructions: Return in about 1 week (around 03/23/2019).   Ortho Exam  Patient is alert, oriented, no adenopathy, well-dressed, normal affect, normal respiratory effort. Prevena VAC was removed and old dried and moist blood was cleaned from the flaps. There is maceration over the inferior border . Sutures are intact and under tension. No drainage following cleaning of the area. No signs of cellulitis of the foot. Palpable pedal pulses.     Imaging: No results found. No images are  attached to the encounter.  Labs: Lab Results  Component Value Date   HGBA1C 6.9 (H) 12/28/2018   HGBA1C 8.0 (A) 10/23/2018   HGBA1C 6.9 (H) 06/26/2018   ESRSEDRATE 1 11/09/2016   ESRSEDRATE 24 (H) 10/06/2016   CRP 0.6 11/09/2016   CRP 6.3 (H) 10/06/2016   REPTSTATUS 11/03/2018 FINAL 10/29/2018   REPTSTATUS 11/03/2018 FINAL 10/29/2018   CULT  10/29/2018    NO GROWTH 5 DAYS Performed at [facility], [address] [place], [place] [address]    CULT  10/29/2018    NO GROWTH 5 DAYS Performed at [facility], [address] [place], [place] [address]      Lab Results  Component Value Date   ALBUMIN 4.2 12/28/2018   ALBUMIN 4.4 11/21/2018   ALBUMIN 3.3 (L) 11/02/2018   PREALBUMIN 20.2 11/11/2015    There is no height or weight on file to calculate BMI.  Orders:  No orders of the defined types were placed in this encounter.  No orders of the defined types were placed in this encounter.    Procedures: No procedures performed  Clinical Data: No additional findings.  ROS:  All other systems negative, except as noted in the HPI. Review of Systems  Objective: Vital Signs: There were no vitals taken for this visit.  Specialty Comments:  No specialty comments available.  PMFS History: Patient Active Problem List   Diagnosis Date Noted  . Foreign body (FB) in soft tissue   . Pressure injury of left foot, stage 2 (HCC) 02/14/2019  . PVD (peripheral vascular disease) (HCC) 02/14/2019  . Snoring 02/14/2019  . S/P laparoscopic cholecystectomy 01/04/2019  . Intractable vomiting   . Sepsis (HCC) 10/29/2018  . Abnormal finding on imaging 10/29/2018  . Cholecystostomy care (HCC) 09/05/2018  . Essential hypertension 06/27/2018  . Hyperlipidemia 06/27/2018  . Diabetic retinopathy (HCC) 06/27/2018  . Leukocytosis 06/27/2018  . Ischemic stroke (HCC) - mult small B infarcts s/p tPA, unknown embolic source 06/25/2018  . Left arm numbness 03/03/2018  .  Strain of right trapezius muscle 03/03/2018  . Ischemic cardiomyopathy 12/16/2017  . Major depressive disorder, recurrent episode, mild (HCC) 09/16/2017  . Acute on chronic systolic and diastolic heart failure, NYHA class 3 (HCC) 09/14/2017  . CAD in native artery 09/14/2017  . S/P BKA (below knee amputation) unilateral, left (HCC) 11/19/2016  . Short Achilles tendon (acquired), left ankle 11/19/2016  . Subacute osteomyelitis of left foot (HCC) 10/20/2016  . Gastroparesis due to DM (HCC)   . History of diabetic ulcer of foot 11/14/2015  . Vitamin D deficiency 11/12/2015  . Osteoporosis 11/12/2015  . GERD (gastroesophageal reflux disease) 11/03/2015  . Erectile dysfunction 07/11/2009  . Allergic rhinitis 12/11/2008  . 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 Insomnia 08/24/2007  . Controlled type 2 diabetes mellitus with diabetic autonomic neuropathy, with long-term current use of insulin (HCC) 08/09/2007   Past Medical History:  Diagnosis Date  . Acute on chronic systolic and diastolic heart failure, NYHA class 3 (HCC) 09/14/2017  . Acute osteomyelitis of metatarsal bone of left foot (HCC) 11/12/2015  . CAD in native artery 09/14/2017  . Closed fracture of right tibial plateau 11/11/2015  . Coronary artery disease   . Depression with anxiety   . Diabetes mellitus    INSULIN DEPENDENT Type II  . Diabetic peripheral neuropathy associated with type 2 diabetes mellitus (HCC) 08/30/2008   Qualifier: Diagnosis of  By: Daphine Deutscher FNP, Zena Amos    . Diabetic ulcer of left foot (HCC) 11/14/2015  . Gastroparesis   . GERD (gastroesophageal reflux disease)   . History of loop recorder 06/2018   Last remote device check was on 02/14/2019  . Hypertension    patient denies  . Left ventricular aneurysm    REPORTS HE IS NOT AWAREW OF THIS   . Osteomyelitis (HCC)   . Osteoporosis 11/12/2015  . Peripheral neuropathy    feet and below bilateral knees   . Peripheral vascular disease (HCC)    PAD in Left leg and some in right leg  . Shortness of breath dyspnea   . Stroke (HCC) 05/2018  . Vitamin D deficiency 11/12/2015    Family History  Problem Relation Age of Onset  . Cancer Paternal Grandmother   . COPD Mother   . Heart failure Mother   . Anxiety disorder Mother   . Depression Mother   . Fibromyalgia Mother   . Esophageal cancer Father        Smoker, still does  . Diabetes Sister   . Fibromyalgia Sister   . Diabetes Maternal Uncle   . Diabetes Maternal Grandmother   . GER disease Sister   . Liver disease Paternal Grandfather   . Hemachromatosis Paternal Grandfather   . Diabetes Maternal Grandfather     Past  Surgical History:  Procedure Laterality Date  . AMPUTATION Left 11/28/2015   Procedure: Left 4th Ray Amputation;  Surgeon: Nadara Mustard, MD;  Location: [facility] OR;  Service: Orthopedics;  Laterality: Left;  . AMPUTATION Left 10/29/2016   Procedure: LEFT 5TH RAY AMPUTATION;  Surgeon: Nadara Mustard, MD;  Location: [facility] OR;  Service: Orthopedics;  Laterality: Left;  . AMPUTATION Left 03/09/2019   Procedure: LEFT FOOT 5TH RAY AMPUTATION;  Surgeon: Nadara Mustard, MD;  Location: [facility] OR;  Service: Orthopedics;  Laterality: Left;  . ANTERIOR VITRECTOMY Left 04/12/2016   Procedure: ANTERIOR CHAMBER WASH OUT WITH GAS FLUID EXCHANGE;  Surgeon: Carmela Rima, MD;  Location: [facility] OR;  Service: Ophthalmology;  Laterality: Left;  . CARDIAC CATHETERIZATION  10/2017  . CATARACT EXTRACTION Left   . CHOLECYSTECTOMY N/A 01/04/2019   Procedure: LAPAROSCOPIC CHOLECYSTECTOMY;  Surgeon: Gaynelle Adu, MD;  Location: [facility] ORS;  Service: General;  Laterality: N/A;  . CORONARY ARTERY BYPASS GRAFT N/A 11/29/2017    LIMA-LAD, SVG-PDA, SVG-CFX  Procedure: CORONARY ARTERY BYPASS GRAFTING (CABG) times three using the left saphaneous vien. harvested endoscopicly and left internal mammary artery.;  Surgeon: Kerin Perna, MD;  Location: [facility] OR;  Service: Open Heart  Surgery;  Laterality: N/A;  . EYE SURGERY     left eye surgery 04/2016  . IR CHOLANGIOGRAM EXISTING TUBE  10/17/2018  . IR CHOLANGIOGRAM EXISTING TUBE  10/30/2018  . IR PERC CHOLECYSTOSTOMY  09/05/2018  . KNEE SURGERY Left   . LOOP RECORDER INSERTION N/A 06/28/2018   Procedure: LOOP RECORDER INSERTION;  Surgeon: Duke Salvia, MD;  Location: [facility] INVASIVE CV LAB;  Service: Cardiovascular;  Laterality: N/A;  . RIGHT/LEFT HEART CATH AND CORONARY ANGIOGRAPHY N/A 10/19/2017   Procedure: RIGHT/LEFT HEART CATH AND CORONARY ANGIOGRAPHY;  Surgeon: Swaziland, [name], MD;  Location: [facility] INVASIVE CV LAB;  Service: Cardiovascular;  Laterality: N/A;  . SHOULDER SURGERY Right   . TEE WITHOUT CARDIOVERSION N/A 11/29/2017   Procedure: TRANSESOPHAGEAL ECHOCARDIOGRAM (TEE);  Surgeon: Donata Clay, Theron Arista, MD;  Location: [facility] OR;  Service: Open Heart Surgery;  Laterality: N/A;  . TEE WITHOUT CARDIOVERSION N/A 06/28/2018   Procedure: TRANSESOPHAGEAL ECHOCARDIOGRAM (TEE);  Surgeon: Jake Bathe, MD;  Location: [facility] ENDOSCOPY;  Service: Cardiovascular;  Laterality: N/A;  . TOE AMPUTATION Left 11/28/15   4th toe   Social History   Occupational History  . Occupation: Disabled  Tobacco Use  . Smoking status: Never Smoker  . Smokeless tobacco: Never Used  Substance and Sexual Activity  . Alcohol use: No  . Drug use: No  . Sexual activity: Yes

## 2019-03-19 ENCOUNTER — Telehealth (INDEPENDENT_AMBULATORY_CARE_PROVIDER_SITE_OTHER): Payer: Self-pay | Admitting: Radiology

## 2019-03-19 ENCOUNTER — Other Ambulatory Visit: Payer: Self-pay

## 2019-03-19 ENCOUNTER — Ambulatory Visit (INDEPENDENT_AMBULATORY_CARE_PROVIDER_SITE_OTHER): Payer: Medicare Other | Admitting: *Deleted

## 2019-03-19 DIAGNOSIS — I639 Cerebral infarction, unspecified: Secondary | ICD-10-CM | POA: Diagnosis not present

## 2019-03-19 LAB — CUP PACEART REMOTE DEVICE CHECK
Date Time Interrogation Session: 20200406171412
Implantable Pulse Generator Implant Date: 20190717

## 2019-03-19 NOTE — Telephone Encounter (Signed)
Pt was called, spoke to [place] about pt in which I reiterated what the other assistant stated and informed them  to try and ambulate more so using his heel and not foot if able, to elevate and if they needed anything else to please call our office.

## 2019-03-19 NOTE — Telephone Encounter (Signed)
Patient's sister called and states that he still has a little bleeding between two stitches and she was not sure if she should be concerned. She states that it is not a lot of blood, it is not oozing, there is no odor. She only notices the blood when she changes the dressings and wipes across with gauze. Travis [name] is not getting up on the foot unless he has to go to the bathroom. She does feel it looks so much better than at last visit.  I explained that a small amount of blood between the stitches would not necessarily be something to worry about, but that if it increased or developed odor or pus we would need to see him sooner. He has a regularly scheduled appointment on Thursday.  Patient's sister, Travis [name], would like for you to call her just to assure that this is ok. Please call [phone]

## 2019-03-21 ENCOUNTER — Telehealth (INDEPENDENT_AMBULATORY_CARE_PROVIDER_SITE_OTHER): Payer: Self-pay

## 2019-03-21 NOTE — Telephone Encounter (Signed)
Called and lm on vm to advise to call the office back to ask prescreen COVID-19 questions prior to tomorrows appt.

## 2019-03-22 ENCOUNTER — Other Ambulatory Visit: Payer: Self-pay

## 2019-03-22 ENCOUNTER — Ambulatory Visit (INDEPENDENT_AMBULATORY_CARE_PROVIDER_SITE_OTHER): Payer: Medicare Other | Admitting: Orthopedic Surgery

## 2019-03-22 ENCOUNTER — Encounter (INDEPENDENT_AMBULATORY_CARE_PROVIDER_SITE_OTHER): Payer: Self-pay | Admitting: Orthopedic Surgery

## 2019-03-22 VITALS — Ht 74.0 in | Wt 212.0 lb

## 2019-03-22 DIAGNOSIS — Z89422 Acquired absence of other left toe(s): Secondary | ICD-10-CM

## 2019-03-22 NOTE — Progress Notes (Signed)
Office Visit Note   Patient: Travis [name]           Date of Birth: 07/21/1971           MRN: [id number] Visit Date: 03/22/2019              Requested by: Wendee BeaversMcMullen, David J, DO [address] [place], [place] [address] PCP: Wendee BeaversMcMullen, David J, DO  Chief Complaint  Patient presents with  . Left Foot - Routine Post Op    03/09/2019 left foot 5th amp      HPI: Patient is a 48 year old gentleman who presents in follow-up approximately 2 weeks status post left foot fifth ray amputation.  Patient presents full weightbearing in a postoperative shoe using a walker.  Assessment & Plan: Visit Diagnoses:  1. 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  Follow-Up Instructions: Return in about 1 week (around 03/29/2019).   Ortho Exam  Patient is alert, oriented, no adenopathy, well-dressed, normal affect, normal respiratory effort. Examination there is slight gaping of the wound.  There is still some maceration with serosanguineous drainage but no ischemic changes no cellulitis no purulence.  Imaging: No results found. No images are attached to the encounter.  Labs: Lab Results  Component Value Date   HGBA1C 6.9 (H) 12/28/2018   HGBA1C 8.0 (A) 10/23/2018   HGBA1C 6.9 (H) 06/26/2018   ESRSEDRATE 1 11/09/2016   ESRSEDRATE 24 (H) 10/06/2016   CRP 0.6 11/09/2016   CRP 6.3 (H) 10/06/2016   REPTSTATUS 11/03/2018 FINAL 10/29/2018   REPTSTATUS 11/03/2018 FINAL 10/29/2018   CULT  10/29/2018    NO GROWTH 5 DAYS Performed at [facility], [address] [place], [place] [address]    CULT  10/29/2018    NO GROWTH 5 DAYS Performed at [facility], [address] [place], [place] [address]      Lab Results  Component Value Date   ALBUMIN 4.2 12/28/2018   ALBUMIN 4.4 11/21/2018   ALBUMIN 3.3 (L) 11/02/2018   PREALBUMIN 20.2 11/11/2015    Body mass index is 27.22 kg/m.  Orders:  No orders of the defined types were placed in this encounter.  No orders of the defined types were placed in this encounter.    Procedures: No procedures performed  Clinical Data: No additional findings.  ROS:  All other systems negative, except as noted in the HPI. Review of Systems  Objective: Vital Signs: Ht 6\' 2"  (1.88 m)   Wt 212 lb (96.2 kg)   BMI 27.22 kg/m   Specialty Comments:  No specialty comments available.  PMFS History: Patient Active Problem List   Diagnosis Date Noted  . Foreign body (FB) in soft tissue   . Pressure injury of left foot, stage 2 (HCC) 02/14/2019  . PVD (peripheral vascular disease) (HCC) 02/14/2019  . Snoring 02/14/2019  . S/P laparoscopic cholecystectomy 01/04/2019  . Intractable vomiting   . Sepsis (HCC) 10/29/2018  . Abnormal finding on imaging 10/29/2018  . Cholecystostomy care (HCC) 09/05/2018  . Essential hypertension 06/27/2018  . Hyperlipidemia 06/27/2018  . Diabetic retinopathy (HCC) 06/27/2018  . Leukocytosis 06/27/2018  . Ischemic stroke (HCC) - mult small B infarcts s/p tPA, unknown embolic source 06/25/2018  . Left arm numbness 03/03/2018  . Strain of right trapezius muscle 03/03/2018  .  Ischemic cardiomyopathy 12/16/2017  . Major depressive disorder, recurrent episode, mild (HCC) 09/16/2017  . Acute on chronic systolic and diastolic heart failure, NYHA class 3 (HCC) 09/14/2017  . CAD in native artery 09/14/2017  . S/P BKA (below knee amputation) unilateral, left (HCC) 11/19/2016  . Short Achilles tendon (acquired), left ankle 11/19/2016  . Subacute osteomyelitis of left foot (HCC) 10/20/2016  . Gastroparesis due to DM (HCC)   . History of diabetic ulcer of foot 11/14/2015  . Vitamin D deficiency 11/12/2015  . Osteoporosis 11/12/2015  . GERD (gastroesophageal reflux disease) 11/03/2015  . Erectile dysfunction 07/11/2009  . Allergic  rhinitis 12/11/2008  . 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 Insomnia 08/24/2007  . Controlled type 2 diabetes mellitus with diabetic autonomic neuropathy, with long-term current use of insulin (HCC) 08/09/2007   Past Medical History:  Diagnosis Date  . Acute on chronic systolic and diastolic heart failure, NYHA class 3 (HCC) 09/14/2017  . Acute osteomyelitis of metatarsal bone of left foot (HCC) 11/12/2015  . CAD in native artery 09/14/2017  . Closed fracture of right tibial plateau 11/11/2015  . Coronary artery disease   . Depression with anxiety   . Diabetes mellitus    INSULIN DEPENDENT Type II  . Diabetic peripheral neuropathy associated with type 2 diabetes mellitus (HCC) 08/30/2008   Qualifier: Diagnosis of  By: Daphine Deutscher FNP, Zena Amos    . Diabetic ulcer of left foot (HCC) 11/14/2015  . Gastroparesis   . GERD (gastroesophageal reflux disease)   . History of loop recorder 06/2018   Last remote device check was on 02/14/2019  . Hypertension    patient denies  . Left ventricular aneurysm    REPORTS HE IS NOT AWAREW OF THIS   . Osteomyelitis (HCC)   . Osteoporosis 11/12/2015  . Peripheral neuropathy    feet and below bilateral knees  . Peripheral vascular disease (HCC)    PAD in Left leg and some in right leg  . Shortness of breath dyspnea   . Stroke (HCC) 05/2018  . Vitamin D deficiency 11/12/2015    Family History  Problem Relation Age of Onset  . Cancer Paternal Grandmother   . COPD Mother   . Heart failure Mother   . Anxiety disorder Mother   . Depression Mother   . Fibromyalgia Mother   . Esophageal cancer Father        Smoker, still does  . Diabetes Sister   . Fibromyalgia Sister   . Diabetes Maternal Uncle   . Diabetes Maternal Grandmother   . GER disease Sister   . Liver disease Paternal Grandfather   . Hemachromatosis Paternal Grandfather   . Diabetes Maternal Grandfather     Past Surgical History:   Procedure Laterality Date  . AMPUTATION Left 11/28/2015   Procedure: Left 4th Ray Amputation;  Surgeon: Nadara Mustard, MD;  Location: [facility] OR;  Service: Orthopedics;  Laterality: Left;  . AMPUTATION Left 10/29/2016   Procedure: LEFT 5TH RAY AMPUTATION;  Surgeon: Nadara Mustard, MD;  Location: [facility] OR;  Service: Orthopedics;  Laterality: Left;  . AMPUTATION Left 03/09/2019   Procedure: LEFT FOOT 5TH RAY AMPUTATION;  Surgeon: Nadara Mustard, MD;  Location: [facility] OR;  Service: Orthopedics;  Laterality: Left;  . ANTERIOR VITRECTOMY Left 04/12/2016   Procedure: ANTERIOR CHAMBER WASH OUT WITH GAS FLUID EXCHANGE;  Surgeon: Carmela Rima, MD;  Location: [facility] OR;  Service: Ophthalmology;  Laterality: Left;  . CARDIAC CATHETERIZATION  10/2017  . CATARACT EXTRACTION Left   . CHOLECYSTECTOMY N/A 01/04/2019   Procedure: LAPAROSCOPIC CHOLECYSTECTOMY;  Surgeon: Gaynelle Adu, MD;  Location: [facility] ORS;  Service: General;  Laterality: N/A;  . CORONARY ARTERY BYPASS GRAFT N/A 11/29/2017    LIMA-LAD, SVG-PDA, SVG-CFX  Procedure: CORONARY ARTERY BYPASS GRAFTING (CABG) times three using the left saphaneous vien. harvested endoscopicly and left internal mammary artery.;  Surgeon: Kerin Perna, MD;  Location: [facility] OR;  Service: Open Heart Surgery;  Laterality: N/A;  . EYE SURGERY     left eye surgery 04/2016  . IR CHOLANGIOGRAM EXISTING TUBE  10/17/2018  . IR CHOLANGIOGRAM EXISTING TUBE  10/30/2018  . IR PERC CHOLECYSTOSTOMY  09/05/2018  . KNEE SURGERY Left   . LOOP RECORDER INSERTION N/A 06/28/2018   Procedure: LOOP RECORDER INSERTION;  Surgeon: Duke Salvia, MD;  Location: [facility] INVASIVE CV LAB;  Service: Cardiovascular;  Laterality: N/A;  . RIGHT/LEFT HEART CATH AND CORONARY ANGIOGRAPHY N/A 10/19/2017   Procedure: RIGHT/LEFT HEART CATH AND CORONARY ANGIOGRAPHY;  Surgeon: Swaziland, [name], MD;  Location: [facility] INVASIVE CV LAB;  Service: Cardiovascular;  Laterality: N/A;  . SHOULDER SURGERY Right   . TEE WITHOUT CARDIOVERSION N/A  11/29/2017   Procedure: TRANSESOPHAGEAL ECHOCARDIOGRAM (TEE);  Surgeon: Donata Clay, Theron Arista, MD;  Location: [facility] OR;  Service: Open Heart Surgery;  Laterality: N/A;  . TEE WITHOUT CARDIOVERSION N/A 06/28/2018   Procedure: TRANSESOPHAGEAL ECHOCARDIOGRAM (TEE);  Surgeon: Jake Bathe, MD;  Location: [facility] ENDOSCOPY;  Service: Cardiovascular;  Laterality: N/A;  . TOE AMPUTATION Left 11/28/15   4th toe   Social History   Occupational History  . Occupation: Disabled  Tobacco Use  . Smoking status: Never Smoker  . Smokeless tobacco: Never Used  Substance and Sexual Activity  . Alcohol use: No  . Drug use: No  . Sexual activity: Yes

## 2019-03-27 ENCOUNTER — Encounter (INDEPENDENT_AMBULATORY_CARE_PROVIDER_SITE_OTHER): Payer: Self-pay | Admitting: Orthopedic Surgery

## 2019-03-28 NOTE — Progress Notes (Signed)
[facility] Summary Report / Loop Recorder 

## 2019-03-29 ENCOUNTER — Ambulatory Visit (INDEPENDENT_AMBULATORY_CARE_PROVIDER_SITE_OTHER): Payer: Medicare Other | Admitting: Orthopedic Surgery

## 2019-04-02 ENCOUNTER — Ambulatory Visit (INDEPENDENT_AMBULATORY_CARE_PROVIDER_SITE_OTHER): Payer: Medicare Other | Admitting: Orthopedic Surgery

## 2019-04-04 ENCOUNTER — Ambulatory Visit (INDEPENDENT_AMBULATORY_CARE_PROVIDER_SITE_OTHER): Payer: Medicare Other | Admitting: Family

## 2019-04-04 ENCOUNTER — Other Ambulatory Visit: Payer: Self-pay | Admitting: Cardiovascular Disease

## 2019-04-04 ENCOUNTER — Encounter (INDEPENDENT_AMBULATORY_CARE_PROVIDER_SITE_OTHER): Payer: Self-pay | Admitting: Family

## 2019-04-04 ENCOUNTER — Other Ambulatory Visit: Payer: Self-pay

## 2019-04-04 DIAGNOSIS — Z89422 Acquired absence of other left toe(s): Secondary | ICD-10-CM

## 2019-04-04 NOTE — Telephone Encounter (Signed)
Losartan 25 mg (taking 1.2 tablet daily) refilled.

## 2019-04-04 NOTE — Progress Notes (Signed)
Office Visit Note   Patient: Travis [name]           Date of Birth: Dec 17, 1970           MRN: [id number] Visit Date: 04/04/2019              Requested by: Wendee Beavers, DO [address] [place], [place] [address] PCP: Wendee Beavers, DO  Chief Complaint  Patient presents with  . Left Foot - Routine Post Op    03/09/2019 5th ray amp      HPI: Patient is a 48 year old gentleman who presents in follow-up approximately 4 weeks status post left foot fifth ray amputation.  Patient presents full weightbearing in a postoperative shoe using a walker.  Assessment & Plan: Visit Diagnoses:  No diagnosis found.  Plan: Discussed the importance of minimizing weightbearing. he will wash the foot daily with soap and water apply a new dry dressing.  Follow-Up Instructions: No follow-ups on file.   Ortho Exam  Patient is alert, oriented, no adenopathy, well-dressed, normal affect, normal respiratory effort. Examination there had been dehiscence of the incision centrally a length of 2 cm, is granulating in. Is 3 mm wide. There is no maceration or serosanguineous drainage but no ischemic changes no cellulitis no purulence.  Imaging: No results found. No images are attached to the encounter.  Labs: Lab Results  Component Value Date   HGBA1C 6.9 (H) 12/28/2018   HGBA1C 8.0 (A) 10/23/2018   HGBA1C 6.9 (H) 06/26/2018   ESRSEDRATE 1 11/09/2016   ESRSEDRATE 24 (H) 10/06/2016   CRP 0.6 11/09/2016   CRP 6.3 (H) 10/06/2016   REPTSTATUS 11/03/2018 FINAL 10/29/2018   REPTSTATUS 11/03/2018 FINAL 10/29/2018   CULT  10/29/2018    NO GROWTH 5 DAYS Performed at [facility], [address] [place], [place] [address]    CULT  10/29/2018    NO GROWTH 5 DAYS Performed at [facility], [address], [place], [place] [address]      Lab Results  Component Value Date   ALBUMIN 4.2 12/28/2018   ALBUMIN 4.4 11/21/2018   ALBUMIN 3.3 (L) 11/02/2018   PREALBUMIN 20.2  11/11/2015    Body mass index is 27.22 kg/m.  Orders:  No orders of the defined types were placed in this encounter.  No orders of the defined types were placed in this encounter.    Procedures: No procedures performed  Clinical Data: No additional findings.  ROS:  All other systems negative, except as noted in the HPI. Review of Systems  Constitutional: Negative for chills and fever.  Cardiovascular: Negative for leg swelling.    Objective: Vital Signs: Ht 6\' 2"  (1.88 m)   Wt 212 lb (96.2 kg)   BMI 27.22 kg/m   Specialty Comments:  No specialty comments available.  PMFS History: Patient Active Problem List   Diagnosis Date Noted  . Foreign body (FB) in soft tissue   . Pressure injury of left foot, stage 2 (HCC) 02/14/2019  . PVD (peripheral vascular disease) (HCC) 02/14/2019  . Snoring 02/14/2019  . S/P laparoscopic cholecystectomy 01/04/2019  . Intractable vomiting   . Sepsis (HCC) 10/29/2018  . Abnormal finding on imaging 10/29/2018  . Cholecystostomy care (HCC) 09/05/2018  . Essential hypertension 06/27/2018  . Hyperlipidemia 06/27/2018  . Diabetic retinopathy (HCC) 06/27/2018  . Leukocytosis 06/27/2018  . Ischemic stroke (HCC) - mult small B infarcts s/p tPA, unknown embolic source 06/25/2018  . Left arm numbness  03/03/2018  . Strain of right trapezius muscle 03/03/2018  . Ischemic cardiomyopathy 12/16/2017  . Major depressive disorder, recurrent episode, mild (HCC) 09/16/2017  . Acute on chronic systolic and diastolic heart failure, NYHA class 3 (HCC) 09/14/2017  . CAD in native artery 09/14/2017  . S/P BKA (below knee amputation) unilateral, left (HCC) 11/19/2016  . Short Achilles tendon (acquired), left ankle 11/19/2016  . Subacute osteomyelitis of left foot (HCC) 10/20/2016  . Gastroparesis due to DM (HCC)   . History of diabetic ulcer of foot 11/14/2015  . Vitamin D deficiency 11/12/2015  . Osteoporosis 11/12/2015  . GERD (gastroesophageal  reflux disease) 11/03/2015  . Erectile dysfunction 07/11/2009  . Allergic rhinitis 12/11/2008  . 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 Insomnia 08/24/2007  . Controlled type 2 diabetes mellitus with diabetic autonomic neuropathy, with long-term current use of insulin (HCC) 08/09/2007   Past Medical History:  Diagnosis Date  . Acute on chronic systolic and diastolic heart failure, NYHA class 3 (HCC) 09/14/2017  . Acute osteomyelitis of metatarsal bone of left foot (HCC) 11/12/2015  . CAD in native artery 09/14/2017  . Closed fracture of right tibial plateau 11/11/2015  . Coronary artery disease   . Depression with anxiety   . Diabetes mellitus    INSULIN DEPENDENT Type II  . Diabetic peripheral neuropathy associated with type 2 diabetes mellitus (HCC) 08/30/2008   Qualifier: Diagnosis of  By: Daphine Deutscher FNP, Zena Amos    . Diabetic ulcer of left foot (HCC) 11/14/2015  . Gastroparesis   . GERD (gastroesophageal reflux disease)   . History of loop recorder 06/2018   Last remote device check was on 02/14/2019  . Hypertension    patient denies  . Left ventricular aneurysm    REPORTS HE IS NOT AWAREW OF THIS   . Osteomyelitis (HCC)   . Osteoporosis 11/12/2015  . Peripheral neuropathy    feet and below bilateral knees  . Peripheral vascular disease (HCC)    PAD in Left leg and some in right leg  . Shortness of breath dyspnea   . Stroke (HCC) 05/2018  . Vitamin D deficiency 11/12/2015    Family History  Problem Relation Age of Onset  . Cancer Paternal Grandmother   . COPD Mother   . Heart failure Mother   . Anxiety disorder Mother   . Depression Mother   . Fibromyalgia Mother   . Esophageal cancer Father        Smoker, still does  . Diabetes Sister   . Fibromyalgia Sister   . Diabetes Maternal Uncle   . Diabetes Maternal Grandmother   . GER disease Sister   . Liver disease Paternal Grandfather   . Hemachromatosis Paternal  Grandfather   . Diabetes Maternal Grandfather     Past Surgical History:  Procedure Laterality Date  . AMPUTATION Left 11/28/2015   Procedure: Left 4th Ray Amputation;  Surgeon: Nadara Mustard, MD;  Location: [facility] OR;  Service: Orthopedics;  Laterality: Left;  . AMPUTATION Left 10/29/2016   Procedure: LEFT 5TH RAY AMPUTATION;  Surgeon: Nadara Mustard, MD;  Location: [facility] OR;  Service: Orthopedics;  Laterality: Left;  . AMPUTATION Left 03/09/2019   Procedure: LEFT FOOT 5TH RAY AMPUTATION;  Surgeon: Nadara Mustard, MD;  Location: [facility] OR;  Service: Orthopedics;  Laterality: Left;  . ANTERIOR VITRECTOMY Left 04/12/2016   Procedure: ANTERIOR CHAMBER WASH OUT WITH GAS FLUID EXCHANGE;  Surgeon: Carmela Rima, MD;  Location: [facility] OR;  Service: Ophthalmology;  Laterality: Left;  . CARDIAC CATHETERIZATION  10/2017  . CATARACT EXTRACTION Left   . CHOLECYSTECTOMY N/A 01/04/2019   Procedure: LAPAROSCOPIC CHOLECYSTECTOMY;  Surgeon: Gaynelle AduWilson, Eric, MD;  Location: [facility] ORS;  Service: General;  Laterality: N/A;  . CORONARY ARTERY BYPASS GRAFT N/A 11/29/2017    LIMA-LAD, SVG-PDA, SVG-CFX  Procedure: CORONARY ARTERY BYPASS GRAFTING (CABG) times three using the left saphaneous vien. harvested endoscopicly and left internal mammary artery.;  Surgeon: Kerin PernaVan Trigt, Peter, MD;  [facility];  Service: Open Heart Surgery;  Laterality: N/A;  . EYE SURGERY     left eye surgery 04/2016  . IR CHOLANGIOGRAM EXISTING TUBE  10/17/2018  . IR CHOLANGIOGRAM EXISTING TUBE  10/30/2018  . IR PERC CHOLECYSTOSTOMY  09/05/2018  . KNEE SURGERY Left   . LOOP RECORDER INSERTION N/A 06/28/2018   Procedure: LOOP RECORDER INSERTION;  Surgeon: Duke SalviaKlein, Steven C, MD;  [facility];  Service: Cardiovascular;  Laterality: N/A;  . RIGHT/LEFT HEART CATH AND CORONARY ANGIOGRAPHY N/A 10/19/2017   Procedure: RIGHT/LEFT HEART CATH AND CORONARY ANGIOGRAPHY;  Surgeon: SwazilandJordan, [name], MD;  [facility];  Service: Cardiovascular;   Laterality: N/A;  . SHOULDER SURGERY Right   . TEE WITHOUT CARDIOVERSION N/A 11/29/2017   Procedure: TRANSESOPHAGEAL ECHOCARDIOGRAM (TEE);  Surgeon: Donata ClayVan Trigt, Theron AristaPeter, MD;  [facility];  Service: Open Heart Surgery;  Laterality: N/A;  . TEE WITHOUT CARDIOVERSION N/A 06/28/2018   Procedure: TRANSESOPHAGEAL ECHOCARDIOGRAM (TEE);  Surgeon: Jake BatheSkains, Mark C, MD;  [facility];  Service: Cardiovascular;  Laterality: N/A;  . TOE AMPUTATION Left 11/28/15   4th toe   Social History   Occupational History  . Occupation: Disabled  Tobacco Use  . Smoking status: Never Smoker  . Smokeless tobacco: Never Used  Substance and Sexual Activity  . Alcohol use: No  . Drug use: No  . Sexual activity: Yes

## 2019-04-04 NOTE — Telephone Encounter (Signed)
Losartan 25 mg refilled. 

## 2019-04-16 ENCOUNTER — Ambulatory Visit (INDEPENDENT_AMBULATORY_CARE_PROVIDER_SITE_OTHER): Payer: Medicare Other | Admitting: Orthopedic Surgery

## 2019-04-16 ENCOUNTER — Other Ambulatory Visit: Payer: Self-pay

## 2019-04-16 ENCOUNTER — Encounter: Payer: Self-pay | Admitting: Orthopedic Surgery

## 2019-04-16 VITALS — Ht 74.0 in | Wt 212.0 lb

## 2019-04-16 DIAGNOSIS — Z794 Long term (current) use of insulin: Secondary | ICD-10-CM

## 2019-04-16 DIAGNOSIS — I255 Ischemic cardiomyopathy: Secondary | ICD-10-CM

## 2019-04-16 DIAGNOSIS — Z89422 Acquired absence of other left toe(s): Secondary | ICD-10-CM

## 2019-04-16 DIAGNOSIS — E1142 Type 2 diabetes mellitus with diabetic polyneuropathy: Secondary | ICD-10-CM

## 2019-04-16 MED ORDER — DOXYCYCLINE HYCLATE 100 MG PO CAPS: 100.0000 mg | ORAL_CAPSULE | Freq: Two times a day (BID) | ORAL | 1 refills | Status: DC

## 2019-04-16 NOTE — Progress Notes (Signed)
Office Visit Note   Patient: Travis [name]           Date of Birth: 10/11/1971           MRN: [id number] Visit Date: 04/16/2019              Requested by: Wendee BeaversMcMullen, David J, DO [address] [place], [place] [address] PCP: Wendee BeaversMcMullen, David J, DO  Chief Complaint  Patient presents with   Left Foot - Routine Post Op    03/09/2019 left foot 5th ray amp; possible infection      HPI: The patient is a 48 yo gentleman who is seen for post operative follow up following left foot 5th ray amputation on 03/09/2019. He comes in today with opening of the incision and drainage. He reports he has been walking on the foot at lot more over the past week as his father was sick in the hospital and passed away and died last Thursday.  He has felt nauseated for the past few days. He was previously on Doxycycline and we are going to resume. He denies any fever or chills.   Assessment & Plan: Visit Diagnoses:  1. 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 Type 2 diabetes mellitus with diabetic polyneuropathy, with long-term current use of insulin (HCC)   3. Ischemic cardiomyopathy     Plan: Discussed the dehiscence of left 5th ray amputation with deep infection and exposed bone discussed with patient and treatment options including possible revision of the left foot vs. transtibial amputation which would allow patient earlier recovery and more sustained function over time.  Patient would like to think over his options overnight and will call office tomorrow to discuss how he would like to proceed. Will resume Doxycycline. He will follow up in the office following surgery. He would like to work with [employer] for any prosthetic needs.   Follow-Up Instructions: Return in about 2 weeks (around 04/30/2019).   Ortho Exam  Patient is alert, oriented, no adenopathy, well-dressed, normal affect, normal respiratory effort. The left lateral foot incision has dehisced and there is foul  drainage and some gas bubbles from the incisional area. There is exposed bone and tendon. There is necrosis over the peri wound . There is mild to moderate edema and erythema over the foot.  He has triphasic pulses by Doppler.   Imaging: No results found.   Labs: Lab Results  Component Value Date   HGBA1C 6.9 (H) 12/28/2018   HGBA1C 8.0 (A) 10/23/2018   HGBA1C 6.9 (H) 06/26/2018   ESRSEDRATE 1 11/09/2016   ESRSEDRATE 24 (H) 10/06/2016   CRP 0.6 11/09/2016   CRP 6.3 (H) 10/06/2016   REPTSTATUS 11/03/2018 FINAL 10/29/2018   REPTSTATUS 11/03/2018 FINAL 10/29/2018   CULT  10/29/2018    NO GROWTH 5 DAYS Performed at [facility], [address] [place], [place] [address]    CULT  10/29/2018    NO GROWTH 5 DAYS Performed at [facility], [address] [place], [place] [address]      Lab Results  Component Value Date   ALBUMIN 4.2 12/28/2018   ALBUMIN 4.4 11/21/2018   ALBUMIN 3.3 (L) 11/02/2018   PREALBUMIN 20.2 11/11/2015    Body mass index is 27.22 kg/m.  Orders:  No orders of the defined types were placed in this encounter.  Meds ordered this encounter  Medications   doxycycline (VIBRAMYCIN) 100 MG capsule  Sig: Take 1 capsule (100 mg total) by mouth 2 (two) times daily.    Dispense:  28 capsule    Refill:  1     Procedures: No procedures performed  Clinical Data: No additional findings.  ROS:  All other systems negative, except as noted in the HPI. Review of Systems  Objective: Vital Signs: Ht  (1.88 m)    Wt 212 lb (96.2 kg)    BMI 27.22 kg/m   Specialty Comments:  No specialty comments available.  PMFS History: Patient Active Problem List   Diagnosis Date Noted   H/O amputation of lesser toe, left (HCC) 04/04/2019   Foreign body (FB) in soft tissue    Pressure injury of left foot, stage 2 (HCC) 02/14/2019   PVD (peripheral vascular disease) (HCC) 02/14/2019   Snoring 02/14/2019   S/P laparoscopic cholecystectomy  01/04/2019   Intractable vomiting    Sepsis (HCC) 10/29/2018   Abnormal finding on imaging 10/29/2018   Cholecystostomy care (HCC) 09/05/2018   Essential hypertension 06/27/2018   Hyperlipidemia 06/27/2018   Diabetic retinopathy (HCC) 06/27/2018   Leukocytosis 06/27/2018   Ischemic stroke (HCC) - mult small B infarcts s/p tPA, unknown embolic source 06/25/2018   Left arm numbness 03/03/2018   Strain of right trapezius muscle 03/03/2018   Ischemic cardiomyopathy 12/16/2017   Major depressive disorder, recurrent episode, mild (HCC) 09/16/2017   Acute on chronic systolic and diastolic heart failure, NYHA class 3 (HCC) 09/14/2017   CAD in native artery 09/14/2017   S/P BKA (below knee amputation) unilateral, left (HCC) 11/19/2016   Short Achilles tendon (acquired), left ankle 11/19/2016   Subacute osteomyelitis of left foot (HCC) 10/20/2016   Gastroparesis due to DM (HCC)    History of diabetic ulcer of foot 11/14/2015   Vitamin D deficiency 11/12/2015   Osteoporosis 11/12/2015   GERD (gastroesophageal reflux disease) 11/03/2015   Erectile dysfunction 07/11/2009   Allergic rhinitis 12/11/2008   Diabetic peripheral neuropathy associated with type 2 diabetes mellitus (HCC) 08/30/2008   Insomnia 08/24/2007   Controlled type 2 diabetes mellitus with diabetic autonomic neuropathy, with long-term current use of insulin (HCC) 08/09/2007   Past Medical History:  Diagnosis Date   Acute on chronic systolic and diastolic heart failure, NYHA class 3 (HCC) 09/14/2017   Acute osteomyelitis of metatarsal bone of left foot (HCC) 11/12/2015   CAD in native artery 09/14/2017   Closed fracture of right tibial plateau 11/11/2015   Coronary artery disease    Depression with anxiety    Diabetes mellitus    INSULIN DEPENDENT Type II   Diabetic peripheral neuropathy associated with type 2 diabetes mellitus (HCC) 08/30/2008   Qualifier: Diagnosis of  By: Daphine Deutscher FNP,  [place]     Diabetic ulcer of left foot (HCC) 11/14/2015   Gastroparesis    GERD (gastroesophageal reflux disease)    History of loop recorder 06/2018   Last remote device check was on 02/14/2019   Hypertension    patient denies   Left ventricular aneurysm    REPORTS HE IS NOT AWAREW OF THIS    Osteomyelitis (HCC)    Osteoporosis 11/12/2015   Peripheral neuropathy    feet and below bilateral knees   Peripheral vascular disease (HCC)    PAD in Left leg and some in right leg   Shortness of breath dyspnea    Stroke (HCC) 05/2018   Vitamin D deficiency 11/12/2015    Family History  Problem Relation Age of Onset   Cancer Paternal Grandmother  COPD Mother    Heart failure Mother    Anxiety disorder Mother    Depression Mother    Fibromyalgia Mother    Esophageal cancer Father        Smoker, still does   Diabetes Sister    Fibromyalgia Sister    Diabetes Maternal Uncle    Diabetes Maternal Grandmother    GER disease Sister    Liver disease Paternal Grandfather    Hemachromatosis Paternal Grandfather    Diabetes Maternal Grandfather     Past Surgical History:  Procedure Laterality Date   AMPUTATION Left 11/28/2015   Procedure: Left 4th Ray Amputation;  Surgeon: Nadara Mustard, MD;  Location: [facility] OR;  Service: Orthopedics;  Laterality: Left;   AMPUTATION Left 10/29/2016   Procedure: LEFT 5TH RAY AMPUTATION;  Surgeon: Nadara Mustard, MD;  Location: [facility] OR;  Service: Orthopedics;  Laterality: Left;   AMPUTATION Left 03/09/2019   Procedure: LEFT FOOT 5TH RAY AMPUTATION;  Surgeon: Nadara Mustard, MD;  Location: [facility] OR;  Service: Orthopedics;  Laterality: Left;   ANTERIOR VITRECTOMY Left 04/12/2016   Procedure: ANTERIOR CHAMBER WASH OUT WITH GAS FLUID EXCHANGE;  Surgeon: Carmela Rima, MD;  Location: [facility] OR;  Service: Ophthalmology;  Laterality: Left;   CARDIAC CATHETERIZATION  10/2017   CATARACT EXTRACTION Left    CHOLECYSTECTOMY N/A 01/04/2019    Procedure: LAPAROSCOPIC CHOLECYSTECTOMY;  Surgeon: Gaynelle Adu, MD;  Location: [facility] ORS;  Service: General;  Laterality: N/A;   CORONARY ARTERY BYPASS GRAFT N/A 11/29/2017    LIMA-LAD, SVG-PDA, SVG-CFX  Procedure: CORONARY ARTERY BYPASS GRAFTING (CABG) times three using the left saphaneous vien. harvested endoscopicly and left internal mammary artery.;  Surgeon: Kerin Perna, MD;  Location: [facility] OR;  Service: Open Heart Surgery;  Laterality: N/A;   EYE SURGERY     left eye surgery 04/2016   IR CHOLANGIOGRAM EXISTING TUBE  10/17/2018   IR CHOLANGIOGRAM EXISTING TUBE  10/30/2018   IR PERC CHOLECYSTOSTOMY  09/05/2018   KNEE SURGERY Left    LOOP RECORDER INSERTION N/A 06/28/2018   Procedure: LOOP RECORDER INSERTION;  Surgeon: Duke Salvia, MD;  Location: [facility] INVASIVE CV LAB;  Service: Cardiovascular;  Laterality: N/A;   RIGHT/LEFT HEART CATH AND CORONARY ANGIOGRAPHY N/A 10/19/2017   Procedure: RIGHT/LEFT HEART CATH AND CORONARY ANGIOGRAPHY;  Surgeon: Swaziland, [name], MD;  [facility] INVASIVE CV LAB;  Service: Cardiovascular;  Laterality: N/A;   SHOULDER SURGERY Right    TEE WITHOUT CARDIOVERSION N/A 11/29/2017   Procedure: TRANSESOPHAGEAL ECHOCARDIOGRAM (TEE);  Surgeon: Donata Clay, Theron Arista, MD;  Location: [facility] OR;  Service: Open Heart Surgery;  Laterality: N/A;   TEE WITHOUT CARDIOVERSION N/A 06/28/2018   Procedure: TRANSESOPHAGEAL ECHOCARDIOGRAM (TEE);  Surgeon: Jake Bathe, MD;  Location: [facility] ENDOSCOPY;  Service: Cardiovascular;  Laterality: N/A;   TOE AMPUTATION Left 11/28/15   4th toe   Social History   Occupational History   Occupation: Disabled  Tobacco Use   Smoking status: Never Smoker   Smokeless tobacco: Never Used  Substance and Sexual Activity   Alcohol use: No   Drug use: No   Sexual activity: Yes

## 2019-04-17 ENCOUNTER — Encounter (HOSPITAL_COMMUNITY): Payer: Self-pay | Admitting: *Deleted

## 2019-04-17 ENCOUNTER — Other Ambulatory Visit: Payer: Self-pay

## 2019-04-17 ENCOUNTER — Ambulatory Visit (INDEPENDENT_AMBULATORY_CARE_PROVIDER_SITE_OTHER): Payer: Self-pay | Admitting: Physician Assistant

## 2019-04-17 NOTE — Anesthesia Preprocedure Evaluation (Addendum)
Anesthesia Evaluation  Patient identified by MRN, date of birth, ID band Patient awake    Reviewed: Allergy & Precautions, H&P , NPO status , Patient's Chart, lab work & pertinent test results  Airway Mallampati: II   Neck ROM: full    Dental   Pulmonary shortness of breath,    breath sounds clear to auscultation       Cardiovascular hypertension, + CAD, + Past MI, + CABG and + Peripheral Vascular Disease   Rhythm:regular Rate:Normal     Neuro/Psych PSYCHIATRIC DISORDERS Anxiety Depression CVA    GI/Hepatic GERD  ,  Endo/Other  diabetes, Type 2, Insulin Dependent  Renal/GU      Musculoskeletal   Abdominal   Peds  Hematology   Anesthesia Other Findings   Reproductive/Obstetrics                            Anesthesia Physical Anesthesia Plan  ASA: III  Anesthesia Plan: General   Post-op Pain Management:    Induction: Intravenous  PONV Risk Score and Plan: 2 and Ondansetron, Midazolam and Treatment may vary due to age or medical condition  Airway Management Planned: LMA  Additional Equipment:   Intra-op Plan:   Post-operative Plan:   Informed Consent: I have reviewed the patients History and Physical, chart, labs and discussed the procedure including the risks, benefits and alternatives for the proposed anesthesia with the patient or authorized representative who has indicated his/her understanding and acceptance.       Plan Discussed with: CRNA, Anesthesiologist and Surgeon  Anesthesia Plan Comments: (See PAT note by Antionette Poles, PA-C )       Anesthesia Quick Evaluation

## 2019-04-17 NOTE — Progress Notes (Signed)
Anesthesia Chart Review: SAME DAY WORKUP   Case:  [id number] Date/Time:  04/18/19 1211   Procedure:  LEFT BELOW KNEE AMPUTATION (Left )   Anesthesia type:  General   Pre-op diagnosis:  Osteomyelitis, Dehiscence Left Foot   [facility] ROOM 10 / [facility]   Surgeon:  [name], MD      DISCUSSION: 48 yo never smoker with h/o PVD,GERD, depression, anxiety, CAD (CABG 11/2017), DM II, Gastroparesis, CHF, HTN, ILR in place, recent cholecystectomy 01/04/19, recent left 5th ray amputation 03/09/19.  S/p CABG x 3 11/2017.  Pt last seen by cardiology on 12/26/18, seen by Dr. Skeet Latch . At that visit pt was doing well, denied chest pain, shortness of breath, LE edema, orthopnea, or PND. Dr. Oval Linsey reported LVEF 35-40%, he is euvolemic, ILR in place with no arrhythmias as of 11/2018.  Previously received clearance on 11/22/18 from Daune Perch, NP for cholecystectomy who at that time stated, "He has had no chest discomfort, shortness of breath, orthopnea. He had some mild edema which over the last few weeks has been much better.  Therefore, based on ACC/AHA guidelines, the patient would be at acceptable risk for the planned procedure without further cardiovascular testing.His beta-blocker should be continued perioperatively if tolerated to reduce his cardiac risk."   He underwent cholecystectomy 2/77/41 without complication. He subsequently underwent left 5th ray amputation 03/09/19 for diabetic ulcer. He has not developed a deep infection with exposed bone at the surgical site and requires BKA.  Anticipate he can proceed as planned barring acute status change.   VS: There were no vitals taken for this visit.  PROVIDERS: Peru Bing, DO is PCP  Skeet Latch, MD is Cardiologist  LABS: Will need DOS labs  IMAGES: Chest Xray 10/29/18 FINDINGS: Heart remains at the upper limits of normal in size status post CABG. Interval placement of a loop recorder. Normal  pulmonary vascularity. No focal consolidation, pleural effusion, or pneumothorax. No acute osseous abnormality.  IMPRESSION: No active disease.  EKG: 11/02/18  Rate 61 bpm Normal sinus rhythm  Anterolateral infarct, age undetermined Abnormal ECG No significant change since last tracing  CV: Stress Test 06/23/18  The left ventricular ejection fraction is moderately decreased (30-44%).  Nuclear stress EF: 44%.  There was no ST segment deviation noted during stress.  No T wave inversion was noted during stress.  Defect 1: There is a large defect of severe severity present in the mid anterior, apical anterior, apical septal, apical inferior, apical lateral and apex location.  Findings consistent with prior myocardial infarction with peri-infarct ischemia.  This is an intermediate risk study.  Intermediate risk nuclear stress test. Extensive and severe apical scar consistent with previous infarction in the distribution of the mid LAD artery. There is minimal peri-infarct ischemia in the mid anterior wall. There are no other areas of significant reversible ischemia. Left ventricular systolic function is mild to moderately depressed and has improved compared with a study from 2017. The perfusion abnormalities are similar. Left ventricular systolic function is very similar to that reported on cardiac MRI from 2018.  Echo 06/28/18 Study Conclusions  - Left ventricle: Systolic function was moderately reduced. The estimated ejection fraction was in the range of 35% to 40%. - Aortic valve: No evidence of vegetation. - Mitral valve: No evidence of vegetation. There was mild regurgitation. - Left atrium: No evidence of thrombus in the atrial cavity or appendage. No evidence of thrombus in the appendage. - Right atrium:  No evidence of thrombus in the atrial cavity or appendage. - Atrial septum: Echo contrast study showed no right-to-left atrial level shunt, at  baseline or with provocation. - Tricuspid valve: No evidence of vegetation. - Pulmonic valve: No evidence of vegetation. - Superior vena cava: The study excluded a thrombus.  Impressions:  - No cardiac source of emboli was indentified.  Cardiac Cath 10/19/2017 (Subsequent CABG 11/29/17) 1. Severe 3 vessel obstructive CAD. - 100% proximal LAD with left to left and right to left collaterals. - 60-70% very large OM1 - 60% mid RCA, Long 80% distal RCA extending down into the PDA 2. Normal LVEDP 3. Normal right heart pressures and cardiac output.  Past Medical History:  Diagnosis Date  . Acute on chronic systolic and diastolic heart failure, NYHA class 3 ([place]) 09/14/2017  . Acute osteomyelitis of metatarsal bone of left foot ([place]) 11/12/2015  . CAD in native artery 09/14/2017  . Closed fracture of right tibial plateau 11/11/2015  . Coronary artery disease   . Depression with anxiety   . Diabetes mellitus    INSULIN DEPENDENT Type II  . Diabetic peripheral neuropathy associated with type 2 diabetes mellitus ([place]) 08/30/2008   Qualifier: Diagnosis of  By: [name] Done FNP, Tori Milks    . Diabetic ulcer of left foot ([place]) 11/14/2015  . Gastroparesis   . GERD (gastroesophageal reflux disease)   . History of loop recorder 06/2018   Last remote device check was on 02/14/2019  . Hypertension    patient denies  . Left ventricular aneurysm    REPORTS HE IS NOT AWAREW OF THIS   . Osteomyelitis ([place])   . Osteoporosis 11/12/2015  . Peripheral neuropathy    feet and below bilateral knees  . Peripheral vascular disease (HCC)    PAD in Left leg and some in right leg  . Shortness of breath dyspnea   . Stroke ([place]) 05/2018  . Vitamin D deficiency 11/12/2015    Past Surgical History:  Procedure Laterality Date  . AMPUTATION Left 11/28/2015   Procedure: Left 4th Ray Amputation;  Surgeon: [name], MD;  Location: [place];  Service: Orthopedics;  Laterality: Left;  . AMPUTATION Left  10/29/2016   Procedure: LEFT 5TH RAY AMPUTATION;  Surgeon: [name], MD;  Location: [place];  Service: Orthopedics;  Laterality: Left;  . AMPUTATION Left 03/09/2019   Procedure: LEFT FOOT 5TH RAY AMPUTATION;  Surgeon: [name], MD;  Location: [place];  Service: Orthopedics;  Laterality: Left;  . ANTERIOR VITRECTOMY Left 04/12/2016   Procedure: ANTERIOR CHAMBER [place] OUT WITH GAS FLUID EXCHANGE;  Surgeon: [name], MD;  Location: [place];  Service: Ophthalmology;  Laterality: Left;  . CARDIAC CATHETERIZATION  10/2017  . CATARACT EXTRACTION Left   . CHOLECYSTECTOMY N/A 01/04/2019   Procedure: LAPAROSCOPIC CHOLECYSTECTOMY;  Surgeon: [name], MD;  Location: [facility] ORS;  Service: General;  Laterality: N/A;  . CORONARY ARTERY BYPASS GRAFT N/A 11/29/2017    LIMA-LAD, SVG-PDA, SVG-CFX  Procedure: CORONARY ARTERY BYPASS GRAFTING (CABG) times three using the left saphaneous vien. harvested endoscopicly and left internal mammary artery.;  Surgeon: [name], MD;  Location: [place];  Service: Open Heart Surgery;  Laterality: N/A;  . EYE SURGERY     left eye surgery 04/2016  . IR CHOLANGIOGRAM EXISTING TUBE  10/17/2018  . IR CHOLANGIOGRAM EXISTING TUBE  10/30/2018  . IR PERC CHOLECYSTOSTOMY  09/05/2018  . KNEE SURGERY Left   . LOOP RECORDER INSERTION N/A 06/28/2018  Procedure: LOOP RECORDER INSERTION;  Surgeon: Deboraha Sprang, MD;  Location: [place] CV LAB;  Service: Cardiovascular;  Laterality: N/A;  . RIGHT/LEFT HEART CATH AND CORONARY ANGIOGRAPHY N/A 10/19/2017   Procedure: RIGHT/LEFT HEART CATH AND CORONARY ANGIOGRAPHY;  Surgeon: [name], [name], MD;  Location: [place] CV LAB;  Service: Cardiovascular;  Laterality: N/A;  . SHOULDER SURGERY Right   . TEE WITHOUT CARDIOVERSION N/A 11/29/2017   Procedure: TRANSESOPHAGEAL ECHOCARDIOGRAM (TEE);  Surgeon: [name], Collier Salina, MD;  Location: [place];  Service: Open Heart Surgery;  Laterality: N/A;  . TEE WITHOUT CARDIOVERSION N/A 06/28/2018    Procedure: TRANSESOPHAGEAL ECHOCARDIOGRAM (TEE);  Surgeon: [name], MD;  Location: [facility] ENDOSCOPY;  Service: Cardiovascular;  Laterality: N/A;  . TOE AMPUTATION Left 11/28/15   4th toe    MEDICATIONS: No current facility-administered medications for this encounter.    Marland Kitchen aspirin EC 325 MG EC tablet  . clopidogrel (PLAVIX) 75 MG tablet  . doxycycline (VIBRAMYCIN) 100 MG capsule  . furosemide (LASIX) 20 MG tablet  . insulin detemir (LEVEMIR) 100 UNIT/ML injection  . lansoprazole (PREVACID) 30 MG capsule  . losartan (COZAAR) 25 MG tablet  . metFORMIN (GLUCOPHAGE XR) 750 MG 24 hr tablet  . metoCLOPramide (REGLAN) 10 MG tablet  . metoprolol succinate (TOPROL-XL) 25 MG 24 hr tablet  . ONETOUCH DELICA LANCETS 83J MISC  . rosuvastatin (CRESTOR) 40 MG tablet  . Blood Glucose Monitoring Suppl (ONETOUCH VERIO) w/Device KIT  . glucose blood (ONETOUCH VERIO) test strip  . oxyCODONE-acetaminophen (PERCOCET/ROXICET) 5-325 MG tablet    Wynonia Musty [facility] Short Stay Center/Anesthesiology Phone [phone] 04/17/2019 1:27 PM

## 2019-04-17 NOTE — Progress Notes (Signed)
SDW-pre-op call completed by pt sister [name] [facility]). [name] denies that pt C/O SOB an chest pain. [name] stated that pt is under the care of Dr. Duke Salvia, Cardiology. [name] stated that pt was not instructed to stop Plavix " he was on it the last surgery." [name] made aware to have pt stop taking  vitamins, fish oil and herbal medications. Do not take any NSAIDs ie: Ibuprofen, Advil, Naproxen (Aleve), Motrin, BC and Goody Powder. [name] made aware to have pt take 10 units of Levemir insulin tonight and no Metformin on DOS. [name] made aware to have pt check his BG every 2 hours prior to arrival to hospital on DOS. Pt made aware to treat a BG < 70 with 4 glucose tabs and wait 15 minutes after intervention to recheck BG, if BG remains < 70, call Short Stay unit to speak with a nurse.  [name] denies that pt and members of the family tested positive for COVID-19.   Coronavirus Screening  [name] denies that pt and family members experienced the following symptoms:  Cough yes/no: No Fever (>100.7F)  yes/no: No Runny nose yes/no: No Sore throat yes/no: No Difficulty breathing/shortness of breath  yes/no: No  Have you or a family member traveled in the last 14 days and where? yes/no: No  [name] reminded that hospital visitation restrictions are in effect and the importance of the restrictions. [name] verbalized understanding of all pre-op instructions.  PA, Anesthesiology, asked to review pt history; see note.

## 2019-04-18 ENCOUNTER — Encounter (HOSPITAL_COMMUNITY)
Admission: RE | Disposition: A | Discharge status: Home Health | Payer: Self-pay | Source: Home / Self Care | Attending: Orthopedic Surgery

## 2019-04-18 ENCOUNTER — Inpatient Hospital Stay (HOSPITAL_COMMUNITY): Payer: Medicare Other | Admitting: Physician Assistant

## 2019-04-18 ENCOUNTER — Encounter (HOSPITAL_COMMUNITY): Payer: Self-pay

## 2019-04-18 ENCOUNTER — Inpatient Hospital Stay (HOSPITAL_COMMUNITY)
Admission: RE | Admit: 2019-04-18 | Discharge: 2019-04-20 | DRG: 617 | Disposition: A | Discharge status: Home Health | Payer: Medicare Other | Attending: Orthopedic Surgery | Admitting: Orthopedic Surgery

## 2019-04-18 ENCOUNTER — Ambulatory Visit: Payer: Self-pay | Admitting: Family

## 2019-04-18 DIAGNOSIS — I110 Hypertensive heart disease with heart failure: Secondary | ICD-10-CM | POA: Diagnosis present

## 2019-04-18 DIAGNOSIS — T8131XS Disruption of external operation (surgical) wound, not elsewhere classified, sequela: Secondary | ICD-10-CM | POA: Diagnosis not present

## 2019-04-18 DIAGNOSIS — Z951 Presence of aortocoronary bypass graft: Secondary | ICD-10-CM | POA: Diagnosis not present

## 2019-04-18 DIAGNOSIS — E1169 Type 2 diabetes mellitus with other specified complication: Secondary | ICD-10-CM | POA: Diagnosis present

## 2019-04-18 DIAGNOSIS — Z800 Family history of malignant neoplasm of digestive organs: Secondary | ICD-10-CM

## 2019-04-18 DIAGNOSIS — M86272 Subacute osteomyelitis, left ankle and foot: Secondary | ICD-10-CM | POA: Diagnosis present

## 2019-04-18 DIAGNOSIS — Z818 Family history of other mental and behavioral disorders: Secondary | ICD-10-CM | POA: Diagnosis not present

## 2019-04-18 DIAGNOSIS — Z8249 Family history of ischemic heart disease and other diseases of the circulatory system: Secondary | ICD-10-CM

## 2019-04-18 DIAGNOSIS — Z8673 Personal history of transient ischemic attack (TIA), and cerebral infarction without residual deficits: Secondary | ICD-10-CM

## 2019-04-18 DIAGNOSIS — Z794 Long term (current) use of insulin: Secondary | ICD-10-CM

## 2019-04-18 DIAGNOSIS — I2510 Atherosclerotic heart disease of native coronary artery without angina pectoris: Secondary | ICD-10-CM | POA: Diagnosis present

## 2019-04-18 DIAGNOSIS — I252 Old myocardial infarction: Secondary | ICD-10-CM

## 2019-04-18 DIAGNOSIS — I5042 Chronic combined systolic (congestive) and diastolic (congestive) heart failure: Secondary | ICD-10-CM | POA: Diagnosis present

## 2019-04-18 DIAGNOSIS — Z825 Family history of asthma and other chronic lower respiratory diseases: Secondary | ICD-10-CM | POA: Diagnosis not present

## 2019-04-18 DIAGNOSIS — E1143 Type 2 diabetes mellitus with diabetic autonomic (poly)neuropathy: Secondary | ICD-10-CM | POA: Diagnosis present

## 2019-04-18 DIAGNOSIS — F418 Other specified anxiety disorders: Secondary | ICD-10-CM | POA: Diagnosis present

## 2019-04-18 DIAGNOSIS — Z833 Family history of diabetes mellitus: Secondary | ICD-10-CM | POA: Diagnosis not present

## 2019-04-18 DIAGNOSIS — K219 Gastro-esophageal reflux disease without esophagitis: Secondary | ICD-10-CM | POA: Diagnosis present

## 2019-04-18 DIAGNOSIS — I255 Ischemic cardiomyopathy: Secondary | ICD-10-CM | POA: Diagnosis present

## 2019-04-18 DIAGNOSIS — E1142 Type 2 diabetes mellitus with diabetic polyneuropathy: Secondary | ICD-10-CM | POA: Diagnosis present

## 2019-04-18 DIAGNOSIS — E1151 Type 2 diabetes mellitus with diabetic peripheral angiopathy without gangrene: Secondary | ICD-10-CM | POA: Diagnosis present

## 2019-04-18 DIAGNOSIS — M810 Age-related osteoporosis without current pathological fracture: Secondary | ICD-10-CM | POA: Diagnosis present

## 2019-04-18 DIAGNOSIS — Z9049 Acquired absence of other specified parts of digestive tract: Secondary | ICD-10-CM

## 2019-04-18 DIAGNOSIS — S88112A Complete traumatic amputation at level between knee and ankle, left lower leg, initial encounter: Secondary | ICD-10-CM | POA: Diagnosis present

## 2019-04-18 HISTORY — PX: AMPUTATION: SHX166

## 2019-04-18 HISTORY — DX: Acute myocardial infarction, unspecified: I21.9

## 2019-04-18 HISTORY — DX: Dehiscence of amputation stump: T87.81

## 2019-04-18 LAB — COMPREHENSIVE METABOLIC PANEL
ALT: 21 U/L (ref 0–44)
AST: 19 U/L (ref 15–41)
Albumin: 3.5 g/dL (ref 3.5–5.0)
Alkaline Phosphatase: 68 U/L (ref 38–126)
Anion gap: 13 (ref 5–15)
BUN: 17 mg/dL (ref 6–20)
CO2: 22 mmol/L (ref 22–32)
Calcium: 9.2 mg/dL (ref 8.9–10.3)
Chloride: 99 mmol/L (ref 98–111)
Creatinine, Ser: 1.08 mg/dL (ref 0.61–1.24)
GFR calc Af Amer: >60 mL/min (ref >60)
GFR calc non Af Amer: >60 mL/min (ref >60)
Glucose, Bld: 121 mg/dL — ABNORMAL HIGH (ref 70–99)
Potassium: 3.7 mmol/L (ref 3.5–5.1)
Sodium: 134 mmol/L — ABNORMAL LOW (ref 135–145)
Total Bilirubin: 1 mg/dL (ref 0.3–1.2)
Total Protein: 6.9 g/dL (ref 6.5–8.1)

## 2019-04-18 LAB — GLUCOSE, CAPILLARY
Glucose-Capillary: 112 mg/dL — ABNORMAL HIGH (ref 70–99)
Glucose-Capillary: 94 mg/dL (ref 70–99)

## 2019-04-18 LAB — CBC
HCT: 39.8 % (ref 39.0–52.0)
Hemoglobin: 13.4 g/dL (ref 13.0–17.0)
MCH: 29 pg (ref 26.0–34.0)
MCHC: 33.7 g/dL (ref 30.0–36.0)
MCV: 86.1 fL (ref 80.0–100.0)
Platelets: 361 10*3/uL (ref 150–400)
RBC: 4.62 MIL/uL (ref 4.22–5.81)
RDW: 12.6 % (ref 11.5–15.5)
WBC: 10.1 10*3/uL (ref 4.0–10.5)
nRBC: 0 % (ref 0.0–0.2)

## 2019-04-18 SURGERY — AMPUTATION BELOW KNEE
Anesthesia: General | Laterality: Left

## 2019-04-18 MED ORDER — ONDANSETRON HCL 4 MG/2ML IJ SOLN: 4.0000 mg | Freq: Four times a day (QID) | INTRAMUSCULAR | Status: DC | PRN

## 2019-04-18 MED ORDER — OXYCODONE HCL 5 MG PO TABS
5.0000 mg | ORAL_TABLET | ORAL | Status: DC | PRN
  Administered 2019-04-18 (×3): 10 mg via ORAL
  Filled 2019-04-18 (×3): qty 2

## 2019-04-18 MED ORDER — LOSARTAN POTASSIUM 25 MG PO TABS
12.5000 mg | ORAL_TABLET | Freq: Every day | ORAL | Status: DC
  Administered 2019-04-18 – 2019-04-20 (×3): 12.5 mg via ORAL
  Filled 2019-04-18 (×3): qty 1

## 2019-04-18 MED ORDER — CEFAZOLIN SODIUM-DEXTROSE 2-4 GM/100ML-% IV SOLN
2.0000 g | INTRAVENOUS | Status: AC
  Administered 2019-04-18: 2 g via INTRAVENOUS
  Filled 2019-04-18: qty 100

## 2019-04-18 MED ORDER — METHOCARBAMOL 1000 MG/10ML IJ SOLN
500.0000 mg | Freq: Four times a day (QID) | INTRAVENOUS | Status: DC | PRN
  Filled 2019-04-18: qty 5

## 2019-04-18 MED ORDER — FENTANYL CITRATE (PF) 100 MCG/2ML IJ SOLN
INTRAMUSCULAR | Status: DC | PRN
  Administered 2019-04-18 (×2): 25 ug via INTRAVENOUS

## 2019-04-18 MED ORDER — HYDROMORPHONE HCL 1 MG/ML IJ SOLN
INTRAMUSCULAR | Status: AC
  Administered 2019-04-18: 1 mg via INTRAVENOUS
  Filled 2019-04-18: qty 1

## 2019-04-18 MED ORDER — ONDANSETRON HCL 4 MG/2ML IJ SOLN
INTRAMUSCULAR | Status: DC | PRN
  Administered 2019-04-18: 4 mg via INTRAVENOUS

## 2019-04-18 MED ORDER — BISACODYL 10 MG RE SUPP: 10.0000 mg | Freq: Every day | RECTAL | Status: DC | PRN

## 2019-04-18 MED ORDER — PROPOFOL 10 MG/ML IV BOLUS
INTRAVENOUS | Status: DC | PRN
  Administered 2019-04-18: 200 mg via INTRAVENOUS
  Administered 2019-04-18 (×2): 50 mg via INTRAVENOUS

## 2019-04-18 MED ORDER — HYDROMORPHONE HCL 1 MG/ML IJ SOLN
0.5000 mg | INTRAMUSCULAR | Status: DC | PRN
  Administered 2019-04-18 – 2019-04-20 (×6): 1 mg via INTRAVENOUS
  Filled 2019-04-18 (×5): qty 1

## 2019-04-18 MED ORDER — MIDAZOLAM HCL 5 MG/5ML IJ SOLN
INTRAMUSCULAR | Status: DC | PRN
  Administered 2019-04-18 (×2): 1 mg via INTRAVENOUS

## 2019-04-18 MED ORDER — ROSUVASTATIN CALCIUM 20 MG PO TABS
40.0000 mg | ORAL_TABLET | Freq: Every evening | ORAL | Status: DC
  Administered 2019-04-18 – 2019-04-19 (×2): 40 mg via ORAL
  Filled 2019-04-18 (×2): qty 2

## 2019-04-18 MED ORDER — POLYETHYLENE GLYCOL 3350 17 G PO PACK: 17.0000 g | PACK | Freq: Every day | ORAL | Status: DC | PRN

## 2019-04-18 MED ORDER — LIDOCAINE 2% (20 MG/ML) 5 ML SYRINGE
INTRAMUSCULAR | Status: DC | PRN
  Administered 2019-04-18: 50 mg via INTRAVENOUS

## 2019-04-18 MED ORDER — CEFAZOLIN SODIUM-DEXTROSE 1-4 GM/50ML-% IV SOLN
1.0000 g | Freq: Four times a day (QID) | INTRAVENOUS | Status: AC
  Administered 2019-04-18 – 2019-04-19 (×3): 1 g via INTRAVENOUS
  Filled 2019-04-18 (×3): qty 50

## 2019-04-18 MED ORDER — CHLORHEXIDINE GLUCONATE 4 % EX LIQD: 60.0000 mL | Freq: Once | CUTANEOUS | Status: DC

## 2019-04-18 MED ORDER — ONDANSETRON HCL 4 MG PO TABS: 4.0000 mg | ORAL_TABLET | Freq: Four times a day (QID) | ORAL | Status: DC | PRN

## 2019-04-18 MED ORDER — MAGNESIUM CITRATE PO SOLN: 1.0000 | Freq: Once | ORAL | Status: DC | PRN

## 2019-04-18 MED ORDER — METOCLOPRAMIDE HCL 5 MG/ML IJ SOLN: 5.0000 mg | Freq: Three times a day (TID) | INTRAMUSCULAR | Status: DC | PRN

## 2019-04-18 MED ORDER — METFORMIN HCL ER 750 MG PO TB24
750.0000 mg | ORAL_TABLET | Freq: Two times a day (BID) | ORAL | Status: DC
  Administered 2019-04-18 – 2019-04-20 (×4): 750 mg via ORAL
  Filled 2019-04-18 (×5): qty 1

## 2019-04-18 MED ORDER — FENTANYL CITRATE (PF) 100 MCG/2ML IJ SOLN
25.0000 ug | INTRAMUSCULAR | Status: DC | PRN
  Administered 2019-04-18 (×2): 50 ug via INTRAVENOUS

## 2019-04-18 MED ORDER — DEXAMETHASONE SODIUM PHOSPHATE 4 MG/ML IJ SOLN
INTRAMUSCULAR | Status: DC | PRN
  Administered 2019-04-18: 5 mg via INTRAVENOUS

## 2019-04-18 MED ORDER — METOCLOPRAMIDE HCL 10 MG PO TABS: 10.0000 mg | ORAL_TABLET | Freq: Four times a day (QID) | ORAL | Status: DC | PRN

## 2019-04-18 MED ORDER — METHOCARBAMOL 500 MG PO TABS
500.0000 mg | ORAL_TABLET | Freq: Four times a day (QID) | ORAL | Status: DC | PRN
  Administered 2019-04-18 (×2): 500 mg via ORAL
  Filled 2019-04-18: qty 1

## 2019-04-18 MED ORDER — OXYCODONE HCL 5 MG PO TABS: 10.0000 mg | ORAL_TABLET | ORAL | Status: DC | PRN

## 2019-04-18 MED ORDER — FENTANYL CITRATE (PF) 250 MCG/5ML IJ SOLN
INTRAMUSCULAR | Status: AC
  Filled 2019-04-18: qty 5

## 2019-04-18 MED ORDER — LACTATED RINGERS IV SOLN
INTRAVENOUS | Status: DC
  Administered 2019-04-18: 11:00:00 via INTRAVENOUS

## 2019-04-18 MED ORDER — FENTANYL CITRATE (PF) 100 MCG/2ML IJ SOLN
INTRAMUSCULAR | Status: AC
  Administered 2019-04-18: 50 ug via INTRAVENOUS
  Filled 2019-04-18: qty 2

## 2019-04-18 MED ORDER — PANTOPRAZOLE SODIUM 20 MG PO TBEC
20.0000 mg | DELAYED_RELEASE_TABLET | Freq: Every day | ORAL | Status: DC
  Administered 2019-04-18 – 2019-04-20 (×3): 20 mg via ORAL
  Filled 2019-04-18 (×2): qty 1

## 2019-04-18 MED ORDER — CLOPIDOGREL BISULFATE 75 MG PO TABS
75.0000 mg | ORAL_TABLET | Freq: Every day | ORAL | Status: DC
  Administered 2019-04-18 – 2019-04-20 (×3): 75 mg via ORAL
  Filled 2019-04-18 (×3): qty 1

## 2019-04-18 MED ORDER — METOCLOPRAMIDE HCL 5 MG PO TABS: 5.0000 mg | ORAL_TABLET | Freq: Three times a day (TID) | ORAL | Status: DC | PRN

## 2019-04-18 MED ORDER — SODIUM CHLORIDE 0.9 % IV SOLN: INTRAVENOUS | Status: DC

## 2019-04-18 MED ORDER — INSULIN ASPART 100 UNIT/ML ~~LOC~~ SOLN
0.0000 [IU] | Freq: Three times a day (TID) | SUBCUTANEOUS | Status: DC
  Administered 2019-04-19 (×2): 2 [IU] via SUBCUTANEOUS

## 2019-04-18 MED ORDER — OXYCODONE HCL 5 MG/5ML PO SOLN: 5.0000 mg | Freq: Once | ORAL | Status: DC | PRN

## 2019-04-18 MED ORDER — INSULIN ASPART 100 UNIT/ML ~~LOC~~ SOLN
4.0000 [IU] | Freq: Three times a day (TID) | SUBCUTANEOUS | Status: DC
  Administered 2019-04-19: 4 [IU] via SUBCUTANEOUS

## 2019-04-18 MED ORDER — MIDAZOLAM HCL 2 MG/2ML IJ SOLN
INTRAMUSCULAR | Status: AC
  Filled 2019-04-18: qty 2

## 2019-04-18 MED ORDER — OXYCODONE HCL 5 MG PO TABS: 5.0000 mg | ORAL_TABLET | Freq: Once | ORAL | Status: DC | PRN

## 2019-04-18 MED ORDER — DOCUSATE SODIUM 100 MG PO CAPS
100.0000 mg | ORAL_CAPSULE | Freq: Two times a day (BID) | ORAL | Status: DC
  Administered 2019-04-18 – 2019-04-20 (×4): 100 mg via ORAL
  Filled 2019-04-18 (×4): qty 1

## 2019-04-18 MED ORDER — OXYCODONE HCL 5 MG PO TABS
ORAL_TABLET | ORAL | Status: AC
  Administered 2019-04-18: 10 mg via ORAL
  Filled 2019-04-18: qty 2

## 2019-04-18 MED ORDER — 0.9 % SODIUM CHLORIDE (POUR BTL) OPTIME
TOPICAL | Status: DC | PRN
  Administered 2019-04-18: 1000 mL

## 2019-04-18 MED ORDER — METOPROLOL SUCCINATE ER 25 MG PO TB24
12.5000 mg | ORAL_TABLET | Freq: Every day | ORAL | Status: DC
  Administered 2019-04-18 – 2019-04-20 (×3): 12.5 mg via ORAL
  Filled 2019-04-18 (×2): qty 1

## 2019-04-18 MED ORDER — ACETAMINOPHEN 325 MG PO TABS
325.0000 mg | ORAL_TABLET | Freq: Four times a day (QID) | ORAL | Status: DC | PRN
  Administered 2019-04-19: 650 mg via ORAL
  Filled 2019-04-18: qty 2

## 2019-04-18 MED ORDER — ONDANSETRON HCL 4 MG/2ML IJ SOLN
4.0000 mg | Freq: Four times a day (QID) | INTRAMUSCULAR | Status: DC | PRN
  Administered 2019-04-18 – 2019-04-19 (×2): 4 mg via INTRAVENOUS
  Filled 2019-04-18 (×2): qty 2

## 2019-04-18 MED ORDER — INSULIN DETEMIR 100 UNIT/ML ~~LOC~~ SOLN
20.0000 [IU] | Freq: Every day | SUBCUTANEOUS | Status: DC
  Administered 2019-04-18 – 2019-04-19 (×2): 20 [IU] via SUBCUTANEOUS
  Filled 2019-04-18 (×3): qty 0.2

## 2019-04-18 MED ORDER — FUROSEMIDE 20 MG PO TABS
20.0000 mg | ORAL_TABLET | Freq: Every day | ORAL | Status: DC
  Administered 2019-04-19 – 2019-04-20 (×2): 20 mg via ORAL
  Filled 2019-04-18 (×2): qty 1

## 2019-04-18 MED ORDER — METHOCARBAMOL 500 MG PO TABS
ORAL_TABLET | ORAL | Status: AC
  Administered 2019-04-18: 500 mg via ORAL
  Filled 2019-04-18: qty 1

## 2019-04-18 SURGICAL SUPPLY — 36 items
BLADE SAW RECIP 87.9 MT (BLADE) ×2 IMPLANT
BLADE SURG 21 STRL SS (BLADE) ×2 IMPLANT
BNDG COHESIVE 6X5 TAN STRL LF (GAUZE/BANDAGES/DRESSINGS) IMPLANT
CANISTER WOUND CARE 500ML ATS (WOUND CARE) ×2 IMPLANT
COVER SURGICAL LIGHT HANDLE (MISCELLANEOUS) ×2 IMPLANT
COVER WAND RF STERILE (DRAPES) IMPLANT
CUFF TOURNIQUET SINGLE 34IN LL (TOURNIQUET CUFF) ×2 IMPLANT
DRAPE INCISE IOBAN 66X45 STRL (DRAPES) ×2 IMPLANT
DRAPE U-SHAPE 47X51 STRL (DRAPES) ×2 IMPLANT
DRESSING PREVENA PLUS CUSTOM (GAUZE/BANDAGES/DRESSINGS) ×1 IMPLANT
DRSG PREVENA PLUS CUSTOM (GAUZE/BANDAGES/DRESSINGS) ×2
DURAPREP 26ML APPLICATOR (WOUND CARE) ×2 IMPLANT
ELECT REM PT RETURN 9FT ADLT (ELECTROSURGICAL) ×2
ELECTRODE REM PT RTRN 9FT ADLT (ELECTROSURGICAL) ×1 IMPLANT
GLOVE BIOGEL PI IND STRL 9 (GLOVE) ×1 IMPLANT
GLOVE BIOGEL PI INDICATOR 9 (GLOVE) ×1
GLOVE SURG ORTHO 9.0 STRL STRW (GLOVE) ×2 IMPLANT
GOWN STRL REUS W/ TWL XL LVL3 (GOWN DISPOSABLE) ×2 IMPLANT
GOWN STRL REUS W/TWL XL LVL3 (GOWN DISPOSABLE) ×2
KIT BASIN OR (CUSTOM PROCEDURE TRAY) ×2 IMPLANT
KIT TURNOVER KIT B (KITS) ×2 IMPLANT
MANIFOLD NEPTUNE II (INSTRUMENTS) ×2 IMPLANT
NS IRRIG 1000ML POUR BTL (IV SOLUTION) ×2 IMPLANT
PACK ORTHO EXTREMITY (CUSTOM PROCEDURE TRAY) ×2 IMPLANT
PAD ARMBOARD 7.5X6 YLW CONV (MISCELLANEOUS) ×2 IMPLANT
PREVENA RESTOR ARTHOFORM 46X30 (CANNISTER) ×2 IMPLANT
SPONGE LAP 18X18 RF (DISPOSABLE) ×2 IMPLANT
STAPLER VISISTAT 35W (STAPLE) IMPLANT
STOCKINETTE IMPERVIOUS LG (DRAPES) ×2 IMPLANT
SUT ETHILON 2 0 PSLX (SUTURE) IMPLANT
SUT SILK 2 0 (SUTURE) ×1
SUT SILK 2-0 18XBRD TIE 12 (SUTURE) ×1 IMPLANT
SUT VIC AB 1 CTX 27 (SUTURE) ×4 IMPLANT
TOWEL OR 17X26 10 PK STRL BLUE (TOWEL DISPOSABLE) ×2 IMPLANT
TUBE CONNECTING 12X1/4 (SUCTIONS) ×2 IMPLANT
YANKAUER SUCT BULB TIP NO VENT (SUCTIONS) ×2 IMPLANT

## 2019-04-18 NOTE — Transfer of Care (Signed)
Immediate Anesthesia Transfer of Care Note  Patient: Travis [name]  Procedure(s) Performed: LEFT BELOW KNEE AMPUTATION (Left )  Patient Location: PACU  Anesthesia Type:General  Level of Consciousness: awake, alert  and oriented  Airway & Oxygen Therapy: Patient Spontanous Breathing and Patient connected to face mask oxygen  Post-op Assessment: Report given to RN and Post -op Vital signs reviewed and stable  Post vital signs: Reviewed and stable  Last Vitals:  Vitals Value Taken Time  BP 109/71 04/18/2019  2:19 PM  Temp    Pulse 74 04/18/2019  2:22 PM  Resp 16 04/18/2019  2:22 PM  SpO2 100 % 04/18/2019  2:22 PM  Vitals shown include unvalidated device data.  Last Pain:  Vitals:   04/18/19 1029  TempSrc:   PainSc: 0-No pain      Patients Stated Pain Goal: 3 (04/18/19 1029)  Complications: No apparent anesthesia complications

## 2019-04-18 NOTE — Op Note (Signed)
   Date of Surgery: 04/18/2019  INDICATIONS: Travis [name] is a 48 y.o.-year-old male who is status post ray amputation with wound dehiscence left foot.  PREOPERATIVE DIAGNOSIS: osteomyelitis and abscess left foot  POSTOPERATIVE DIAGNOSIS: Same.  PROCEDURE: Transtibial amputation Application of Prevena wound VAC  SURGEON: Lajoyce Corners, M.D.  ANESTHESIA:  general  IV FLUIDS AND URINE: See anesthesia.  ESTIMATED BLOOD LOSS: min mL.  COMPLICATIONS: None.  DESCRIPTION OF PROCEDURE: The patient was brought to the operating room and underwent a general anesthetic. After adequate levels of anesthesia were obtained patient's lower extremity was prepped using DuraPrep draped into a sterile field. A timeout was called. The foot was draped out of the sterile field with impervious stockinette. A transverse incision was made 11 cm distal to the tibial tubercle. This curved proximally and a large posterior flap was created. The tibia was transected 1 cm proximal to the skin incision. The fibula was transected just proximal to the tibial incision. The tibia was beveled anteriorly. A large posterior flap was created. The sciatic nerve was pulled cut and allowed to retract. The vascular bundles were suture ligated with 2-0 silk. The deep and superficial fascial layers were closed using #1 Vicryl. The skin was closed using staples and 2-0 nylon. The wound was covered with a Prevena restor and customizable  wound VAC. There was a good suction fit. A prosthetic shrinker will be applied. Patient was extubated taken to the PACU in stable condition.   DISCHARGE PLANNING:  Antibiotic duration:24 hours  Weightbearing: NWB left  Pain medication: opoid pathway  Dressing care/ Wound XAJ:OINOMVEH for 1 week  Discharge to: SNF or CIR  Follow-up: In the office 1 week post operative.  Aldean Baker, MD [facility] 2:29 PM

## 2019-04-18 NOTE — Anesthesia Procedure Notes (Signed)
Procedure Name: LMA Insertion Date/Time: 04/18/2019 1:43 PM Performed by: Fransisca Kaufmann, CRNA Pre-anesthesia Checklist: Patient identified, Emergency Drugs available, Suction available and Patient being monitored Patient Re-evaluated:Patient Re-evaluated prior to induction Oxygen Delivery Method: Circle System Utilized Preoxygenation: Pre-oxygenation with 100% oxygen Induction Type: IV induction Ventilation: Mask ventilation without difficulty LMA: LMA inserted LMA Size: 5.0 Number of attempts: 1 Airway Equipment and Method: Bite block Placement Confirmation: positive ETCO2 Tube secured with: Tape Dental Injury: Teeth and Oropharynx as per pre-operative assessment

## 2019-04-18 NOTE — H&P (Signed)
Travis [name] is an 48 y.o. male.   Chief Complaint: Dehiscence surgical incision with foul-smelling drainage. HPI: The patient is a 48 yo gentleman who is seen for post operative follow up following left foot 5th ray amputation on 03/09/2019. He comes in today with opening of the incision and drainage, with necrotic soft tissue and exposed bone. He reports he has been walking on the foot at lot more over the past week as his father was sick in the hospital and passed away and died last 2023/04/14.  He has felt nauseated for the past few days. He denies any fever or chills.   Past Medical History:  Diagnosis Date  . Acute on chronic systolic and diastolic heart failure, NYHA class 3 (HCC) 09/14/2017  . Acute osteomyelitis of metatarsal bone of left foot (HCC) 11/12/2015  . CAD in native artery 09/14/2017  . Closed fracture of right tibial plateau 11/11/2015  . Coronary artery disease   . Dehiscence of amputation stump (HCC)    left leg  . Depression with anxiety   . Diabetes mellitus    INSULIN DEPENDENT Type II  . Diabetic peripheral neuropathy associated with type 2 diabetes mellitus (HCC) 08/30/2008   Qualifier: Diagnosis of  By: Daphine Deutscher FNP, Zena Amos    . Diabetic ulcer of left foot (HCC) 11/14/2015  . Gastroparesis   . GERD (gastroesophageal reflux disease)   . History of loop recorder 06/2018   Last remote device check was on 02/14/2019  . Hypertension    patient denies  . Left ventricular aneurysm    REPORTS HE IS NOT AWAREW OF THIS   . Myocardial infarction (HCC)   . Osteomyelitis (HCC)   . Osteoporosis 11/12/2015  . Peripheral neuropathy    feet and below bilateral knees  . Peripheral vascular disease (HCC)    PAD in Left leg and some in right leg  . Shortness of breath dyspnea   . Stroke (HCC) 05/2018  . Vitamin D deficiency 11/12/2015    Past Surgical History:  Procedure Laterality Date  . AMPUTATION Left 11/28/2015   Procedure: Left 4th Ray Amputation;  Surgeon: Nadara Mustard, MD;  Location: [facility] OR;  Service: Orthopedics;  Laterality: Left;  . AMPUTATION Left 10/29/2016   Procedure: LEFT 5TH RAY AMPUTATION;  Surgeon: Nadara Mustard, MD;  Location: [facility] OR;  Service: Orthopedics;  Laterality: Left;  . AMPUTATION Left 03/09/2019   Procedure: LEFT FOOT 5TH RAY AMPUTATION;  Surgeon: Nadara Mustard, MD;  Location: [facility] OR;  Service: Orthopedics;  Laterality: Left;  . ANTERIOR VITRECTOMY Left 04/12/2016   Procedure: ANTERIOR CHAMBER WASH OUT WITH GAS FLUID EXCHANGE;  Surgeon: Carmela Rima, MD;  Location: [facility] OR;  Service: Ophthalmology;  Laterality: Left;  . CARDIAC CATHETERIZATION  10/2017  . CATARACT EXTRACTION Left   . CHOLECYSTECTOMY N/A 01/04/2019   Procedure: LAPAROSCOPIC CHOLECYSTECTOMY;  Surgeon: Gaynelle Adu, MD;  Location: [facility] ORS;  Service: General;  Laterality: N/A;  . CORONARY ARTERY BYPASS GRAFT N/A 11/29/2017    LIMA-LAD, SVG-PDA, SVG-CFX  Procedure: CORONARY ARTERY BYPASS GRAFTING (CABG) times three using the left saphaneous vien. harvested endoscopicly and left internal mammary artery.;  Surgeon: Kerin Perna, MD;  Location: [facility] OR;  Service: Open Heart Surgery;  Laterality: N/A;  . EYE SURGERY     left eye surgery 04/2016  . IR CHOLANGIOGRAM EXISTING TUBE  10/17/2018  . IR CHOLANGIOGRAM EXISTING TUBE  10/30/2018  . IR PERC CHOLECYSTOSTOMY  09/05/2018  . KNEE SURGERY Left   .  LOOP RECORDER INSERTION N/A 06/28/2018   Procedure: LOOP RECORDER INSERTION;  Surgeon: Duke SalviaKlein, Steven C, MD;  [facility];  Service: Cardiovascular;  Laterality: N/A;  . RIGHT/LEFT HEART CATH AND CORONARY ANGIOGRAPHY N/A 10/19/2017   Procedure: RIGHT/LEFT HEART CATH AND CORONARY ANGIOGRAPHY;  Surgeon: SwazilandJordan, [name], MD;  [facility];  Service: Cardiovascular;  Laterality: N/A;  . SHOULDER SURGERY Right   . TEE WITHOUT CARDIOVERSION N/A 11/29/2017   Procedure: TRANSESOPHAGEAL ECHOCARDIOGRAM (TEE);  Surgeon: Donata ClayVan Trigt, Theron AristaPeter, MD;  [facility];   Service: Open Heart Surgery;  Laterality: N/A;  . TEE WITHOUT CARDIOVERSION N/A 06/28/2018   Procedure: TRANSESOPHAGEAL ECHOCARDIOGRAM (TEE);  Surgeon: Jake BatheSkains, Mark C, MD;  [facility];  Service: Cardiovascular;  Laterality: N/A;  . TOE AMPUTATION Left 11/28/15   4th toe    Family History  Problem Relation Age of Onset  . Cancer Paternal Grandmother   . COPD Mother   . Heart failure Mother   . Anxiety disorder Mother   . Depression Mother   . Fibromyalgia Mother   . Esophageal cancer Father        Smoker, still does  . Diabetes Sister   . Fibromyalgia Sister   . Diabetes Maternal Uncle   . Diabetes Maternal Grandmother   . GER disease Sister   . Liver disease Paternal Grandfather   . Hemachromatosis Paternal Grandfather   . Diabetes Maternal Grandfather    Social History:  reports that he has never smoked. He has never used smokeless tobacco. He reports that he does not drink alcohol or use drugs.  Allergies: No Known Allergies  No medications prior to admission.    No results found for this or any previous visit (from the past 48 hour(s)). No results found.  Review of Systems  All other systems reviewed and are negative.   There were no vitals taken for this visit. Physical Exam  Patient is alert, oriented, no adenopathy, well-dressed, normal affect, normal respiratory effort. The left lateral foot incision has dehisced and there is foul drainage and some gas bubbles from the incisional area. There is exposed bone and tendon. There is necrosis over the peri wound . There is mild to moderate edema and erythema over the foot.  He has triphasic pulses by Doppler at the ankle.  Patient has fixed clawing of the toes with cavovarus deformity.  Patient does not have a plantigrade foot.  Assessment/Plan 1. 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 Type 2 diabetes mellitus with diabetic polyneuropathy, with long-term current use of  insulin (HCC)   3. Ischemic cardiomyopathy     Plan: Discussed the dehiscence of left 5th ray amputation with deep infection and exposed bone discussed with patient and treatment options including possible revision of the left foot vs. transtibial amputation which would allow patient earlier recovery and more sustained function over time.  Patient and family state they would like to proceed with the transtibial amputation. He will follow up in the office following surgery. He would like to work with [employer] for any prosthetic needs.   Nadara MustardMarcus V Duda, MD 04/18/2019, 6:56 AM

## 2019-04-18 NOTE — Plan of Care (Signed)
  Problem: Clinical Measurements: Goal: Cardiovascular complication will be avoided Outcome: Progressing   Problem: Activity: Goal: Risk for activity intolerance will decrease Outcome: Progressing   Problem: Nutrition: Goal: Adequate nutrition will be maintained Outcome: Progressing   Problem: Coping: Goal: Level of anxiety will decrease Outcome: Progressing   Problem: Elimination: Goal: Will not experience complications related to bowel motility Outcome: Progressing   

## 2019-04-19 ENCOUNTER — Encounter (HOSPITAL_COMMUNITY): Payer: Self-pay | Admitting: Orthopedic Surgery

## 2019-04-19 LAB — GLUCOSE, CAPILLARY
Glucose-Capillary: 136 mg/dL — ABNORMAL HIGH (ref 70–99)
Glucose-Capillary: 129 mg/dL — ABNORMAL HIGH (ref 70–99)
Glucose-Capillary: 147 mg/dL — ABNORMAL HIGH (ref 70–99)
Glucose-Capillary: 117 mg/dL — ABNORMAL HIGH (ref 70–99)
Glucose-Capillary: 128 mg/dL — ABNORMAL HIGH (ref 70–99)

## 2019-04-19 MED ORDER — ENSURE MAX PROTEIN PO LIQD
11.0000 [oz_av] | Freq: Two times a day (BID) | ORAL | Status: DC
  Filled 2019-04-19 (×4): qty 330

## 2019-04-19 MED ORDER — ADULT MULTIVITAMIN W/MINERALS CH
1.0000 | ORAL_TABLET | Freq: Every day | ORAL | Status: DC
  Administered 2019-04-19 – 2019-04-20 (×2): 1 via ORAL
  Filled 2019-04-19 (×2): qty 1

## 2019-04-19 MED ORDER — PROMETHAZINE HCL 25 MG/ML IJ SOLN
12.5000 mg | INTRAMUSCULAR | Status: DC | PRN
  Administered 2019-04-19 (×2): 25 mg via INTRAVENOUS
  Filled 2019-04-19 (×2): qty 1

## 2019-04-19 NOTE — Progress Notes (Signed)
Orthopedic Tech Progress Note Patient Details:  Travis [name] 04/28/1971 [id number]  Patient ID: Travis [name], male   DOB: 1971-12-01, 48 y.o.   MRN: [id number]   Travis [name] 04/19/2019, 8:09 AMCalled [employer] for left stump shrinker and limb guard.

## 2019-04-19 NOTE — Progress Notes (Signed)
Inpatient Rehabilitation Admissions Coordinator  Inpatient rehab consult received. I await therapy evaluations and then will meet with patient.  Ottie Glazier, RN, MSN Rehab Admissions Coordinator [phone] 04/19/2019 1:08 PM

## 2019-04-19 NOTE — Progress Notes (Signed)
OT Cancellation Note  Patient Details Name: Travis [name] MRN: [id number] DOB: 05/29/1971   Cancelled Treatment:    Reason Eval/Treat Not Completed: Fatigue/lethargy limiting ability to participate. Pt declining OT evaluation this afternoon due to fatigue and effects of pain meds (does not feel that he would do well participating).  Will re-attempt OT evaluation later today as time allows.   Cipriano Mile OTR/L 04/19/2019, 2:19 PM

## 2019-04-19 NOTE — Progress Notes (Signed)
Patient ID: Travis [name], male   DOB: July 31, 1971, 48 y.o.   MRN: [id number] Patient is postoperative day 1 left transtibial amputation.  There is no drainage in the wound VAC canister.  Patient has nausea that he had prior to surgery he states this is secondary to his n.p.o. status preoperative.  Patient does take Reglan.  Possible discharge to inpatient rehabilitation.  Phenergan ordered to see if this works better than Zofran.

## 2019-04-19 NOTE — Social Work (Addendum)
CSW acknowledging consult for SNF placement. Will follow for therapy recommendations.  Also of note-upon chart review it appears pt does NOT have a court appointed legal guardian.   Octavio Graves, MSW, [facility] Clinical Social Work [phone]

## 2019-04-19 NOTE — Progress Notes (Signed)
Initial Nutrition Assessment  RD working remotely.  DOCUMENTATION CODES:   Not applicable  INTERVENTION:   -Ensure Max po BID, each supplement provides 150 kcal and 30 grams of protein.  -MVI with minerals daily  NUTRITION DIAGNOSIS:   Increased nutrient needs related to wound healing as evidenced by estimated needs.  GOAL:   Patient will meet greater than or equal to 90% of their needs  MONITOR:   PO intake, Supplement acceptance, Labs, Weight trends, Skin, I & O's  REASON FOR ASSESSMENT:   Other (Comment)    ASSESSMENT:   48 yo gentleman who is seen for post operative follow up following left foot 5th ray amputation on 03/09/2019. He comes in today with opening of the incision and drainage, with necrotic soft tissue and exposed bone. He reports he has been walking on the foot at lot more over the past week as his father was sick in the hospital and passed away and died last Thursday.   Pt admitted with osteomyelitis and abscess of lt foot.   5/6- s/p lt BKA and wound vac placement  Per therapy notes, pt very lethargic this afternoon due to effects of pain medications (unable to participate in therapy evaluation). Pt unable to participate in interview at this time.   Pt with variable PO intake (likely due to lethargy). Noted meal completion 25-75%.   Reviewed wt hx, which reveals wt stability over the 4 months.   Due to increased nutritional needs for wound healing, pt would benefit from addition of oral nutrition supplements.   Lab Results  Component Value Date   HGBA1C 6.9 (H) 12/28/2018   PTA DM medications are 20 units insulin determir daily and 850 mg metformin BID .   Labs reviewed: CBGS: 94-136 (inpatient orders for glycemic control are 0-15 units insulin aspart TID with meals, 4 units insulin aspart TID with meals, 20 units insulin detemir q HS, and 750 mg metformin BID).   NUTRITION - FOCUSED PHYSICAL EXAM:    Most Recent Value  Orbital Region  Unable  to assess  Upper Arm Region  Unable to assess  Thoracic and Lumbar Region  Unable to assess  Buccal Region  Unable to assess  Temple Region  Unable to assess  Clavicle Bone Region  Unable to assess  Clavicle and Acromion Bone Region  Unable to assess  Scapular Bone Region  Unable to assess  Dorsal Hand  Unable to assess  Patellar Region  Unable to assess  Anterior Thigh Region  Unable to assess  Posterior Calf Region  Unable to assess  Edema (RD Assessment)  Unable to assess  Hair  Unable to assess  Eyes  Unable to assess  Mouth  Unable to assess  Skin  Unable to assess  Nails  Unable to assess       Diet Order:   Diet Order            Diet Carb Modified Fluid consistency: Thin; Room service appropriate? Yes  Diet effective now              EDUCATION NEEDS:   No education needs have been identified at this time  Skin:  Skin Assessment: Skin Integrity Issues: Skin Integrity Issues:: Wound VAC Wound Vac: lt BKA site  Last BM:  04/17/19  Height:   Ht Readings from Last 1 Encounters:  04/18/19 6\' 2"  (1.88 m)    Weight:   Wt Readings from Last 1 Encounters:  04/18/19 97.5 kg  Ideal Body Weight:  80.8 kg  BMI:  Body mass index is 27.6 kg/m.  Estimated Nutritional Needs:   Kcal:  2200-2400  Protein:  120-135 grams  Fluid:  > 2.2 L    Travis [name] A. Mayford Knife, RD, LDN, CDCES Registered Dietitian II Certified Diabetes Care and Education Specialist Pager: [phone] After hours Pager: [phone]

## 2019-04-19 NOTE — Plan of Care (Signed)

## 2019-04-19 NOTE — Anesthesia Postprocedure Evaluation (Signed)
Anesthesia Post Note  Patient: Travis [name]  Procedure(s) Performed: LEFT BELOW KNEE AMPUTATION (Left )     Patient location during evaluation: PACU Anesthesia Type: General Level of consciousness: awake and alert Pain management: pain level controlled Vital Signs Assessment: post-procedure vital signs reviewed and stable Respiratory status: spontaneous breathing, nonlabored ventilation, respiratory function stable and patient connected to nasal cannula oxygen Cardiovascular status: blood pressure returned to baseline and stable Postop Assessment: no apparent nausea or vomiting Anesthetic complications: no    Last Vitals:  Vitals:   04/19/19 0015 04/19/19 0421  BP: 129/74 (!) 154/71  Pulse: 76 77  Resp: 18 16  Temp: 36.7 C 36.7 C  SpO2: 99% 100%    Last Pain:  Vitals:   04/19/19 0421  TempSrc: Oral  PainSc:                  Yulia Ulrich S

## 2019-04-19 NOTE — Evaluation (Signed)
Physical Therapy Evaluation Patient Details Name: Travis [name] MRN: [id number] DOB: 07/04/1971 Today's Date: 04/19/2019   History of Present Illness  Pt is a 48 y.o. male admitted 04/18/19 with L foot osteomyelitis (s/p previous 5th ray amputation 03/09/19), now s/p L transtibial amputation 04/18/19. PMH includes CAD, HF, DM, PVD, stroke (06/2018), HTN, osteoporosis.    Clinical Impression  Pt presents with an overall decrease in functional mobility secondary to above. PTA, pt mod indep with quad cane, lives with children. Educ on precautions, positioning, therex, and importance of mobility. Today, pt able to initiate transfer and gait training with RW and minA; pt limited by weakness and decreased activity tolerance. Feel pt would benefit from intensive CIR-level therapies to maximize functional mobility and independence prior to return home, pending pt progressing with acute rehab services. Will follow acutely to address established goals.    Follow Up Recommendations CIR;Supervision for mobility/OOB    Equipment Recommendations  (TBD)    Recommendations for Other Services       Precautions / Restrictions Precautions Precautions: Fall Precaution Comments: LLE wound vac Restrictions Weight Bearing Restrictions: Yes LLE Weight Bearing: Non weight bearing      Mobility  Bed Mobility Overal bed mobility: Modified Independent             General bed mobility comments: HOB elevated, use of bed rail  Transfers Overall transfer level: Needs assistance Equipment used: Rolling walker (2 wheeled) Transfers: Scientist, clinical (histocompatibility and immunogenetics) Transfers;Sit to/from Stand Sit to Stand: Min assist   Squat pivot transfers: Min assist     General transfer comment: MinA to pivot from bed to recliner on RLE, heavy reliance on UE support with c/o R lower leg weakness. Once seated, able to stand from recliner to RW with minA for balance and to steady RW  Ambulation/Gait Ambulation/Gait assistance: Min  assist Gait Distance (Feet): 10 Feet Assistive device: Rolling walker (2 wheeled)   Gait velocity: Decreased   General Gait Details: Able to amb 10' forwards/backwards with RW utilizing hop-to gait pattern on RLE; minA for balance  Stairs            Wheelchair Mobility    Modified Rankin (Stroke Patients Only)       Balance Overall balance assessment: Needs assistance   Sitting balance-Leahy Scale: Fair       Standing balance-Leahy Scale: Poor Standing balance comment: Heavy reliance on UE support                             Pertinent Vitals/Pain Pain Assessment: 0-10 Pain Score: 5  Pain Location: L residual limb Pain Descriptors / Indicators: Guarding;Discomfort Pain Intervention(s): Limited activity within patient's tolerance;Repositioned;RN gave pain meds during session    Home Living Family/patient expects to be discharged to:: Private residence Living Arrangements: Children Available Help at Discharge: Family;Available 24 hours/day Type of Home: House Home Access: Stairs to enter;Ramped entrance     Home Layout: One level Home Equipment: Walker - 2 wheels;Wheelchair - manual;Cane - single point;Toilet riser;Cane - quad      Prior Function Level of Independence: Independent with assistive device(s)         Comments: Recent toe partial foot amputation (02/2019) and has been trying to stay off of leg as much as possible; primarily sedentary. Community ambulation with quad cane. Indep for ADLs; stands for showers     Hand Dominance   Dominant Hand: Right    Extremity/Trunk Assessment   Upper  Extremity Assessment Upper Extremity Assessment: Overall WFL for tasks assessed    Lower Extremity Assessment Lower Extremity Assessment: RLE deficits/detail;LLE deficits/detail RLE Deficits / Details: RLE grossly 4/5 throughout LLE Deficits / Details: L hip flex and knee flex/ext grossly 3/5; has full knee extension although reports posterior  knee tightness with this LLE Sensation: decreased light touch       Communication   Communication: No difficulties  Cognition Arousal/Alertness: Awake/alert Behavior During Therapy: WFL for tasks assessed/performed Overall Cognitive Status: Within Functional Limits for tasks assessed                                        General Comments      Exercises Amputee Exercises Hip ABduction/ADduction: AROM;Left;Seated Knee Flexion: AROM;Left;Seated Knee Extension: AROM;Left;Seated Straight Leg Raises: AROM;Left;Seated Chair Push Up: AROM;Both;Seated   Assessment/Plan    PT Assessment Patient needs continued PT services  PT Problem List Decreased strength;Decreased range of motion;Decreased activity tolerance;Decreased balance;Decreased mobility;Decreased knowledge of use of DME;Decreased knowledge of precautions;Pain       PT Treatment Interventions DME instruction;Gait training;Stair training;Functional mobility training;Therapeutic activities;Therapeutic exercise;Balance training;Patient/family education;Wheelchair mobility training    PT Goals (Current goals can be found in the Care Plan section)  Acute Rehab PT Goals Patient Stated Goal: Future LLE prosthetic use PT Goal Formulation: With patient Time For Goal Achievement: 05/03/19 Potential to Achieve Goals: Good Additional Goals Additional Goal #1: Pt will self-propel manual wheelchair 200' independently.    Frequency Min 3X/week   Barriers to discharge        Co-evaluation               AM-PAC PT "6 Clicks" Mobility  Outcome Measure Help needed turning from your back to your side while in a flat bed without using bedrails?: None Help needed moving from lying on your back to sitting on the side of a flat bed without using bedrails?: None Help needed moving to and from a bed to a chair (including a wheelchair)?: A Little Help needed standing up from a chair using your arms (e.g., wheelchair  or bedside chair)?: A Little Help needed to walk in hospital room?: A Little Help needed climbing 3-5 steps with a railing? : A Lot 6 Click Score: 19    End of Session Equipment Utilized During Treatment: Gait belt Activity Tolerance: Patient tolerated treatment well Patient left: in chair;with call bell/phone within reach Nurse Communication: Mobility status PT Visit Diagnosis: Other abnormalities of gait and mobility (R26.89);Muscle weakness (generalized) (M62.81)    Time: [id number] PT Time Calculation (min) (ACUTE ONLY): 32 min   Charges:   PT Evaluation $PT Eval Moderate Complexity: 1 Mod PT Treatments $Gait Training: 8-22 mins      Ina HomesJaclyn Sharion Grieves, PT, DPT Acute Rehabilitation Services  Pager [phone] Office [phone]  Malachy ChamberJaclyn L Anthonymichael Munday 04/19/2019, 1:18 PM

## 2019-04-19 NOTE — Progress Notes (Signed)
Inpatient Rehabilitation Admissions Coordinator  Inpatient Rehab Consult received. I met with patient at the bedside for rehabilitation assessment.  He has had prn pain meds and does not want to discuss his options for rehab venue at this time. He would like for me to return in the morning when he and I can conference call his sister to discuss any rehab he will need. I will follow up in the morning.  Danne Baxter, RN, MSN Rehab Admissions Coordinator [phone] 04/19/2019 2:18 PM

## 2019-04-20 LAB — GLUCOSE, CAPILLARY
Glucose-Capillary: 82 mg/dL (ref 70–99)
Glucose-Capillary: 106 mg/dL — ABNORMAL HIGH (ref 70–99)

## 2019-04-20 MED ORDER — OXYCODONE-ACETAMINOPHEN 5-325 MG PO TABS: 1.0000 | ORAL_TABLET | ORAL | 0 refills | Status: DC | PRN

## 2019-04-20 MED ORDER — METOCLOPRAMIDE HCL 5 MG PO TABS: 5.0000 mg | ORAL_TABLET | Freq: Four times a day (QID) | ORAL | Status: DC | PRN

## 2019-04-20 NOTE — Progress Notes (Signed)
Pt for discharge going home, discontinued peripheral IV line, will change wound vac to portable wound vac, alert and oriented, OOB to chair, given 1x pain meds, given health teachings, next appointment, due med explained and understood, wound site dry and intact, no output in the wound vac at this time.

## 2019-04-20 NOTE — Progress Notes (Addendum)
Subjective: 2 Days Post-Op Procedure(s) (LRB): LEFT BELOW KNEE AMPUTATION (Left) Patient reports pain as mild and moderate.   Reports very poor po intake since admission due to does not like choices and problems with nausea.   Objective: Vital signs in last 24 hours: Temp:  [98.1 F (36.7 C)-98.5 F (36.9 C)] 98.2 F (36.8 C) (05/08 0453) Pulse Rate:  [71-78] 71 (05/08 0453) Resp:  [16] 16 (05/08 0453) BP: (92-135)/(64-75) 135/75 (05/08 0453) SpO2:  [98 %-99 %] 99 % (05/08 0453)  Intake/Output from previous day: 05/07 0701 - 05/08 0700 In: 600 [P.O.:600] Out: 500 [Urine:500] Intake/Output this shift: No intake/output data recorded.  Recent Labs    04/18/19 1020  HGB 13.4   Recent Labs    04/18/19 1020  WBC 10.1  RBC 4.62  HCT 39.8  PLT 361   Recent Labs    04/18/19 1020  NA 134*  K 3.7  CL 99  CO2 22  BUN 17  CREATININE 1.08  GLUCOSE 121*  CALCIUM 9.2   No results for input(s): LABPT, INR in the last 72 hours.  Left transtibial amputation site with VAC in place and functioning well. No drainage in VAC canister   Assessment/Plan: 2 Days Post-Op Procedure(s) (LRB): LEFT BELOW KNEE AMPUTATION (Left) Continue VAC therapy. Up with PT/OT Possibly to CIR Nutrition consult due to poor po intake and problems with nausea prior to admit due to gastroparesis- Will add reglan back in prn.     [name], PA-C 04/20/2019, 8:14 AM  [facility] [phone]  Adden: patient feels he will do better at home and feels family can assist. Will plan to DC later today with [facility] PT/OT.

## 2019-04-20 NOTE — Progress Notes (Signed)
Inpatient Rehabilitation Admissions Coordinator  I met with patient at bedside to discuss a possible inpt rehab admission. He states he prefers d/c home and has a 48 year old son at home 24/7 unemployed and does not want to remain in hospital, for he can not eat the food. He would like to connect with the prosthetics company prior to d/c home and [facility] will need to be set up. He states he has all the DME needed. I contacted Shawn Rayburn, PA and she is aware of pt preference. We will sign off at this time.  Danne Baxter, RN, MSN Rehab Admissions Coordinator [phone] 04/20/2019 10:20 AM

## 2019-04-20 NOTE — Progress Notes (Signed)
Physical Therapy Treatment Patient Details Name: Travis [name] MRN: [id number] DOB: 1971-10-31 Today's Date: 04/20/2019    History of Present Illness Pt is a 48 y.o. male admitted 04/18/19 with L foot osteomyelitis (s/p previous 5th ray amputation 03/09/19), now s/p L transtibial amputation 04/18/19. PMH includes CAD, HF, DM, PVD, stroke (06/2018), HTN, osteoporosis.    PT Comments    Pt performed HOP to pattern to and from bathroom to perform ADLs.  He is progressing well.  Continues to benefit from CIR but adamant to return home, will need HHPT has all necessary equipment.     Follow Up Recommendations  CIR;Supervision for mobility/OOB     Equipment Recommendations  (TBD)    Recommendations for Other Services       Precautions / Restrictions Precautions Precautions: Fall Precaution Comments: LLE wound vac Restrictions Weight Bearing Restrictions: Yes LLE Weight Bearing: Non weight bearing    Mobility  Bed Mobility Overal bed mobility: Modified Independent             General bed mobility comments: HOB elevated, use of bed rail  Transfers Overall transfer level: Needs assistance Equipment used: Rolling walker (2 wheeled) Transfers: Sit to/from Stand Sit to Stand: Min guard   Squat pivot transfers: Min guard     General transfer comment: Guarding for safety  Ambulation/Gait Ambulation/Gait assistance: Min assist;Min guard Gait Distance (Feet): 10 Feet(x2) Assistive device: Rolling walker (2 wheeled)   Gait velocity: Decreased   General Gait Details:  utilizing hop-to gait pattern on RLE; minA for balance   Stairs             Wheelchair Mobility    Modified Rankin (Stroke Patients Only)       Balance Overall balance assessment: Needs assistance   Sitting balance-Leahy Scale: Fair       Standing balance-Leahy Scale: Poor Standing balance comment: Heavy reliance on UE support                            Cognition  Arousal/Alertness: Awake/alert Behavior During Therapy: WFL for tasks assessed/performed Overall Cognitive Status: Within Functional Limits for tasks assessed                                        Exercises      General Comments        Pertinent Vitals/Pain Pain Assessment: Faces Pain Score: 5  Faces Pain Scale: Hurts a little bit Pain Location: L residual limb Pain Descriptors / Indicators: Guarding;Discomfort Pain Intervention(s): Monitored during session;Ice applied    Home Living                      Prior Function            PT Goals (current goals can now be found in the care plan section) Acute Rehab PT Goals Patient Stated Goal: Future LLE prosthetic use Potential to Achieve Goals: Good Additional Goals Additional Goal #1: Pt will self-propel manual wheelchair 200' independently. Progress towards PT goals: Progressing toward goals    Frequency    Min 3X/week      PT Plan Current plan remains appropriate    Co-evaluation              AM-PAC PT "6 Clicks" Mobility   Outcome Measure  Help needed turning from your back to  your side while in a flat bed without using bedrails?: None Help needed moving from lying on your back to sitting on the side of a flat bed without using bedrails?: None Help needed moving to and from a bed to a chair (including a wheelchair)?: A Little Help needed standing up from a chair using your arms (e.g., wheelchair or bedside chair)?: A Little Help needed to walk in hospital room?: A Little Help needed climbing 3-5 steps with a railing? : A Lot 6 Click Score: 19    End of Session Equipment Utilized During Treatment: Gait belt Activity Tolerance: Patient tolerated treatment well Patient left: in chair;with call bell/phone within reach   PT Visit Diagnosis: Other abnormalities of gait and mobility (R26.89);Muscle weakness (generalized) (M62.81)     Time: 1029-1050 PT Time Calculation (min)  (ACUTE ONLY): 21 min  Charges:  $Gait Training: 8-22 mins                     Joycelyn RuaAimee Shakena Callari, PTA Acute Rehabilitation Services Pager [phone] Office [phone]     Tamas Suen Artis DelayJ Greydon Betke 04/20/2019, 5:14 PM

## 2019-04-20 NOTE — Discharge Summary (Signed)
Discharge Diagnoses:  Active Problems:   Subacute osteomyelitis, left ankle and foot (HCC)   Dehiscence of incision, sequela   Below-knee amputation of left lower extremity ([place])   Surgeries: Procedure(s): LEFT BELOW KNEE AMPUTATION on 04/18/2019    Consultants:   Discharged Condition: Improved  Hospital Course: Travis [name] is an 48 y.o. male who was admitted 04/18/2019 with a chief complaint of osteomyelitis of left foot, with a final diagnosis of Osteomyelitis, Dehiscence Left Foot.  Patient was brought to the operating room on 04/18/2019 and underwent Procedure(s): LEFT BELOW KNEE AMPUTATION.    Patient was given perioperative antibiotics:  Anti-infectives (From admission, onward)   Start     Dose/Rate Route Frequency Ordered Stop   04/18/19 2000  ceFAZolin (ANCEF) IVPB 1 g/50 mL premix     1 g 100 mL/hr over 30 Minutes Intravenous Every 6 hours 04/18/19 1959 04/19/19 1015   04/18/19 1030  ceFAZolin (ANCEF) IVPB 2g/100 mL premix     2 g 200 mL/hr over 30 Minutes Intravenous On call to O.R. 04/18/19 1020 04/18/19 1345    .  Patient was given sequential compression devices, early ambulation, and aspirin for DVT prophylaxis.  Recent vital signs:  Patient Vitals for the past 24 hrs:  BP Temp Temp src Pulse Resp SpO2  04/20/19 0453 135/75 98.2 F (36.8 C) Oral 71 16 99 %  04/19/19 2035 117/68 98.1 F (36.7 C) Oral 78 16 98 %  04/19/19 1310 92/64 98.5 F (36.9 C) Oral 77 16 99 %  .  Recent laboratory studies: No results found.  Discharge Medications:   Allergies as of 04/20/2019   No Known Allergies     Medication List    TAKE these medications   aspirin 325 MG EC tablet Take 1 tablet (325 mg total) by mouth daily. What changed:  when to take this   clopidogrel 75 MG tablet Commonly known as:  PLAVIX Take 1 tablet (75 mg total) by mouth daily. What changed:  when to take this   doxycycline 100 MG capsule Commonly known as:  VIBRAMYCIN Take 1 capsule (100 mg  total) by mouth 2 (two) times daily.   furosemide 20 MG tablet Commonly known as:  Lasix Take 1 tablet (20 mg total) by mouth daily.   glucose blood test strip Commonly known as:  OneTouch Verio 1 each by Other route as needed for other. Use as instructed   insulin detemir 100 UNIT/ML injection Commonly known as:  Levemir Inject 0.2 mLs (20 Units total) into the skin at bedtime. Take 1/2 of normal dose tonight 06/27/18 for planned procedure tomorrow. Then resume normal dose What changed:  additional instructions   lansoprazole 30 MG capsule Commonly known as:  PREVACID TAKE ONE CAPSULE BY MOUTH DAILY AT 12 NOON What changed:  See the new instructions.   losartan 25 MG tablet Commonly known as:  COZAAR TAKE 1/2 TABLET BY MOUTH EVERY DAY   metFORMIN 750 MG 24 hr tablet Commonly known as:  Glucophage XR TAKE 1 TAB DAILY FOR 1 WEEK THEN INCREASE TO 2 TABS DAILY What changed:    how much to take  how to take this  when to take this  additional instructions   metoCLOPramide 10 MG tablet Commonly known as:  Reglan Take 1 tablet (10 mg total) by mouth every 6 (six) hours as needed for nausea.   metoprolol succinate 25 MG 24 hr tablet Commonly known as:  TOPROL-XL Take 0.5 tablets (12.5 mg total) by  mouth daily. What changed:  when to take this   OneTouch Delica Lancets 57M Misc 1 application by Does not apply route 4 (four) times daily.   OneTouch Verio w/Device Kit 1 application by Does not apply route 4 (four) times daily.   oxyCODONE-acetaminophen 5-325 MG tablet Commonly known as:  PERCOCET/ROXICET Take 1 tablet by mouth every 4 (four) hours as needed for moderate pain. What changed:  reasons to take this   rosuvastatin 40 MG tablet Commonly known as:  CRESTOR TAKE 1 TABLET(40 MG) BY MOUTH DAILY What changed:  See the new instructions.       Diagnostic Studies: No results found.  Patient benefited maximally from their hospital stay and there were no  complications.     Disposition: Discharge disposition: 01-Home or Self Care      Discharge Instructions    Call MD / Call 911   Complete by:  As directed    If you experience chest pain or shortness of breath, CALL 911 and be transported to the hospital emergency room.  If you develope a fever above 101 F, pus (white drainage) or increased drainage or redness at the wound, or calf pain, call your surgeon's office.   Constipation Prevention   Complete by:  As directed    Drink plenty of fluids.  Prune juice may be helpful.  You may use a stool softener, such as Colace (over the counter) 100 mg twice a day.  Use MiraLax (over the counter) for constipation as needed.   Diet - low sodium heart healthy   Complete by:  As directed    Increase activity slowly as tolerated   Complete by:  As directed      Follow-up Information    [name], MD In 1 week.   Specialty:  Orthopedic Surgery Contact information: [place] [address] [phone]            Signed: Ulysees Barns 04/20/2019, 12:22 PM  [employer] [phone]

## 2019-04-20 NOTE — TOC Initial Note (Addendum)
Transition of Care [facility]) - Initial/Assessment Note    Patient Details  Name: Travis [name] MRN: [id number] Date of Birth: 04/08/1971  [facility] CM/SW Contact:    Kingsley Plan, RN Phone Number: 04/20/2019, 11:03 AM  Clinical Narrative:                 Patient from home with 48 year old son. He has wheelchair, cane, knee scooter and 3 in 1 and shower chair at home already.   Patient wishes to go home with home health at discharge and not CIR. Spoke to patient and confirmed. Explained message has been sent to [facility] PA with Dr Lajoyce Corners . We would need orders and face to face for home health.   Patient plans to discharge to his home [address], [place], [address], he has a 55 year old son who can provide 24 hour assistance.   Provided Medicare.gov home health list. Patient has never had home health before. NCM asked permission to call patient's sister Francesca Jewett [phone] , patient agreed. Called Ms Doroteo Glassman awaiting call back.   Ms Doroteo Glassman returned call. Above explained. Ms Doroteo Glassman had questions regarding wound care. Explained Prevena VAC. Discussed home health and Medicare.gov list. She would like to speak with Shawn Rayburn PA and than her brother. Shawn aware and will call Ms Doroteo Glassman. Anticipated discharge tomorrow 04/21/19  Shawn called back, discharge is today , she called [name] however, no answer.   NCM called [name] to let her know discharge is today. [name] would like [facility]. She has tried to call her brother but he did not answer. NCM went to patient's room with [name] on phone.  Patient aware discharge today ( he requested DC to be today). [name] will call patient directly , patient states he will answer . [name] will arrange for a family member to pick patient up today.   Referral given to [facility] with [facility] awaiting call back.  Expected Discharge Plan: Home w Home Health Services Barriers to Discharge: Continued Medical Work up   Patient  Goals and CMS Choice Patient states their goals for this hospitalization and ongoing recovery are:: to go home  CMS Medicare.gov Compare Post Acute Care list provided to:: Patient Choice offered to / list presented to : Patient, Sibling  Expected Discharge Plan and Services Expected Discharge Plan: Home w Home Health Services   Discharge Planning Services: CM Consult Post Acute Care Choice: Home Health Living arrangements for the past 2 months: Single Family Home                 DME Arranged: N/A DME Agency: NA                  Prior Living Arrangements/Services Living arrangements for the past 2 months: Single Family Home Lives with:: Adult Children(39 year old son ) Patient language and need for interpreter reviewed:: Yes Do you feel safe going back to the place where you live?: Yes      Need for Family Participation in Patient Care: Yes (Comment) Care giver support system in place?: Yes (comment) Current home services: DME Criminal Activity/Legal Involvement Pertinent to Current Situation/Hospitalization: No - Comment as needed  Activities of Daily Living Home Assistive Devices/Equipment: Cane (specify quad or straight), CBG Meter, Walker (specify type), Wheelchair, Brace (specify type), Shower chair with back, Raised toilet seat with rails ADL Screening (condition at time of admission) Patient's cognitive ability adequate to safely complete daily  activities?: Yes Is the patient deaf or have difficulty hearing?: No Does the patient have difficulty seeing, even when wearing glasses/contacts?: Yes Does the patient have difficulty concentrating, remembering, or making decisions?: No Patient able to express need for assistance with ADLs?: Yes Does the patient have difficulty dressing or bathing?: No Independently performs ADLs?: Yes (appropriate for developmental age) Does the patient have difficulty walking or climbing stairs?: No Weakness of Legs: Both Weakness of  Arms/Hands: None  Permission Sought/Granted Permission sought to share information with : Case Manager, Family Supports Permission granted to share information with : Yes, Verbal Permission Granted  Share Information with NAME: Francesca JewettLaurie Phelps sister [phone]           Emotional Assessment Appearance:: Appears stated age Attitude/Demeanor/Rapport: Engaged Affect (typically observed): Accepting Orientation: : Oriented to Self, Oriented to Place, Oriented to  Time, Oriented to Situation Alcohol / Substance Use: Not Applicable Psych Involvement: No (comment)  Admission diagnosis:  Osteomyelitis, Dehiscence Left Foot Patient Active Problem List   Diagnosis Date Noted  . Below-knee amputation of left lower extremity (HCC) 04/18/2019  . Dehiscence of incision, sequela   . H/O amputation of lesser toe, left (HCC) 04/04/2019  . Foreign body (FB) in soft tissue   . Pressure injury of left foot, stage 2 (HCC) 02/14/2019  . PVD (peripheral vascular disease) (HCC) 02/14/2019  . Snoring 02/14/2019  . S/P laparoscopic cholecystectomy 01/04/2019  . Intractable vomiting   . Sepsis (HCC) 10/29/2018  . Abnormal finding on imaging 10/29/2018  . Cholecystostomy care (HCC) 09/05/2018  . Essential hypertension 06/27/2018  . Hyperlipidemia 06/27/2018  . Diabetic retinopathy (HCC) 06/27/2018  . Leukocytosis 06/27/2018  . Ischemic stroke (HCC) - mult small B infarcts s/p tPA, unknown embolic source 06/25/2018  . Left arm numbness 03/03/2018  . Strain of right trapezius muscle 03/03/2018  . Ischemic cardiomyopathy 12/16/2017  . Major depressive disorder, recurrent episode, mild (HCC) 09/16/2017  . Acute on chronic systolic and diastolic heart failure, NYHA class 3 (HCC) 09/14/2017  . CAD in native artery 09/14/2017  . S/P BKA (below knee amputation) unilateral, left (HCC) 11/19/2016  . Short Achilles tendon (acquired), left ankle 11/19/2016  . Subacute osteomyelitis, left ankle and foot (HCC)  10/20/2016  . Gastroparesis due to DM (HCC)   . History of diabetic ulcer of foot 11/14/2015  . Vitamin D deficiency 11/12/2015  . Osteoporosis 11/12/2015  . GERD (gastroesophageal reflux disease) 11/03/2015  . Erectile dysfunction 07/11/2009  . Allergic rhinitis 12/11/2008  . 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 Insomnia 08/24/2007  . Controlled type 2 diabetes mellitus with diabetic autonomic neuropathy, with long-term current use of insulin (HCC) 08/09/2007   PCP:  Wendee BeaversMcMullen, David J, DO Pharmacy:   [facility] [id number] - [place], [place] - [address] [facility] [address] [place] [id number] Phone: [phone] Fax: [phone]     Social Determinants of Health (SDOH) Interventions    Readmission Risk Interventions No flowsheet data found.

## 2019-04-20 NOTE — Progress Notes (Signed)
Occupational Therapy Evaluation Patient Details Name: Travis [name] MRN: [id number] DOB: 08/25/1971 Today's Date: 04/20/2019    History of Present Illness Pt is a 48 y.o. male admitted 04/18/19 with L foot osteomyelitis (s/p previous 5th ray amputation 03/09/19), now s/p L transtibial amputation 04/18/19. PMH includes CAD, HF, DM, PVD, stroke (06/2018), HTN, osteoporosis.   Clinical Impression   Pt admitted with above diagnosis. PTA, pt is independent with use of cane at home.  He has 24/7 family support at home.  During evaluation, pt requires min guard for functional mobility with RW. Varying levels of supervision-min assist for ADLs. He has necessary DME at home. Recommending 24/7 support for return home. Will continue to follow acutely to maximize safety and independence with ADLs.    Follow Up Recommendations  No OT follow up;Supervision/Assistance - 24 hour    Equipment Recommendations  None recommended by OT    Recommendations for Other Services       Precautions / Restrictions Precautions Precautions: Fall Precaution Comments: LLE wound vac Restrictions Weight Bearing Restrictions: Yes LLE Weight Bearing: Non weight bearing      Mobility Bed Mobility Overal bed mobility: Modified Independent                Transfers Overall transfer level: Needs assistance Equipment used: Rolling walker (2 wheeled) Transfers: Sit to/from Stand Sit to Stand: Min guard         General transfer comment: Guarding for safety    Balance                                           ADL either performed or assessed with clinical judgement   ADL Overall ADL's : Needs assistance/impaired     Grooming: Wash/dry hands;Wash/dry face;Oral care;Set up;Sitting;Supervision/safety Grooming Details (indicate cue type and reason): Sitting on 3n1 at sink Upper Body Bathing: Set up;Supervision/ safety;Sitting   Lower Body Bathing: Minimal assistance;Sit to/from stand    Upper Body Dressing : Supervision/safety;Set up;Sitting   Lower Body Dressing: Minimal assistance;Sit to/from stand   Toilet Transfer: Min guard;Ambulation;RW;BSC Toilet Transfer Details (indicate cue type and reason): Ambulated into bathroom to perform grooming tasks at sink level.         Functional mobility during ADLs: Min guard;Rolling walker General ADL Comments: Therapist educated him on LB dressing techniques. Recommended completing ADL tasks in seated position.      Vision         Perception     Praxis      Pertinent Vitals/Pain Pain Assessment: Faces Faces Pain Scale: Hurts a little bit Pain Location: L residual limb Pain Descriptors / Indicators: Guarding;Discomfort Pain Intervention(s): Limited activity within patient's tolerance;Monitored during session;Repositioned     Hand Dominance Right   Extremity/Trunk Assessment Upper Extremity Assessment Upper Extremity Assessment: Overall WFL for tasks assessed   Lower Extremity Assessment Lower Extremity Assessment: Defer to PT evaluation       Communication Communication Communication: No difficulties   Cognition Arousal/Alertness: Awake/alert Behavior During Therapy: WFL for tasks assessed/performed Overall Cognitive Status: Within Functional Limits for tasks assessed                                     General Comments       Exercises     Shoulder Instructions  Home Living Family/patient expects to be discharged to:: Private residence Living Arrangements: Children;Other relatives Available Help at Discharge: Family;Available 24 hours/day Type of Home: House Home Access: Stairs to enter;Ramped entrance Entrance Stairs-Number of Steps: 4 Entrance Stairs-Rails: Right;Left;Can reach both Home Layout: One level     Bathroom Shower/Tub: Walk-in shower;Tub/shower unit   Bathroom Toilet: Standard Bathroom Accessibility: Yes   Home Equipment: Walker - 2 wheels;Wheelchair -  manual;Cane - single point;Toilet riser;Cane - quad;Shower seat          Prior Functioning/Environment Level of Independence: Independent with assistive device(s)        Comments: Recent toe partial foot amputation (02/2019) and has been trying to stay off of leg as much as possible; primarily sedentary. Community ambulation with quad cane. Indep for ADLs; stands for showers        OT Problem List: Decreased strength;Decreased activity tolerance;Impaired balance (sitting and/or standing);Decreased knowledge of use of DME or AE;Pain      OT Treatment/Interventions: Self-care/ADL training;DME and/or AE instruction;Therapeutic activities;Patient/family education;Balance training    OT Goals(Current goals can be found in the care plan section) Acute Rehab OT Goals Patient Stated Goal: Future LLE prosthetic use OT Goal Formulation: With patient Time For Goal Achievement: 05/04/19 Potential to Achieve Goals: Good  OT Frequency: Min 2X/week   Barriers to D/C:            Co-evaluation              AM-PAC OT "6 Clicks" Daily Activity     Outcome Measure Help from another person eating meals?: None Help from another person taking care of personal grooming?: A Little Help from another person toileting, which includes using toliet, bedpan, or urinal?: A Little Help from another person bathing (including washing, rinsing, drying)?: A Little Help from another person to put on and taking off regular upper body clothing?: None Help from another person to put on and taking off regular lower body clothing?: A Little 6 Click Score: 20   End of Session Equipment Utilized During Treatment: Gait belt;Rolling walker Nurse Communication: Mobility status  Activity Tolerance: Patient tolerated treatment well Patient left: in chair;with call bell/phone within reach  OT Visit Diagnosis: Unsteadiness on feet (R26.81);Pain;Other abnormalities of gait and mobility (R26.89) Pain - Right/Left:  Left Pain - part of body: Leg                Time: [id number] OT Time Calculation (min): 20 min Charges:  OT General Charges $OT Visit: 1 Visit OT Evaluation $OT Eval Moderate Complexity: 1 Mod    Cipriano MileJohnson, Jenna Elizabeth OTR/L Acute Rehabilitation Services [phone] 04/20/2019, 1:06 PM

## 2019-04-20 NOTE — Discharge Instructions (Signed)
Keep Prevena VAC machine plugged into a wall outlet as much as possible to keep machine charged.   Tips For Adding Protein  Patients may be advised to increase the protein in their diet but not necessarily the calories as well. However, note that when adding protein to your diet, you will also be adding extra calories. The following suggestions may help add the extra protein while keeping the calories as low as possible.  Tips  Add extra egg to one or more meals   Increase the portion of milk to drink and change to skim milk if able   Include [place] yogurt or cottage cheese for snack or part of a meal   Increase portion size of protein entre and decrease portion of starch/bread   Mix protein powder, nut butter, almond/nut milk, non-fat dry milk, or Greek yogurt to shakes and smoothies   Use these ingredients also in baked goods or other recipes  Use double the amount of sandwich filling   Add protein foods to all snacks including cheese, nut butters, milk and yogurt Food Tips for Including Protein  Beans  Cook and use dried peas, beans, and tofu in soups or add to casseroles, pastas, and grain dishes that also contain cheese or meat   Mash with cheese and milk   Use tofu to make smoothies  Commercial Protein Supplements  Use nutritional supplements or protein powder sold at pharmacies and grocery stores   Use protein powder in milk drinks and desserts, such as pudding   Mix with ice cream, milk, and fruit or other flavorings for a high-protein milkshake  Cottage Cheese or [employer]  Mix with or use to stuff fruits and vegetables   Add to casseroles, spaghetti, noodles, or egg dishes such as omelets, scrambled eggs, and souffls   Use gelatin, pudding-type desserts, cheesecake, and pancake or waffle batter   Use to stuff crepes, pasta shells, or manicotti   Puree and use as a substitute for sour cream  Eggs, Egg whites, and Egg Yolks  Add chopped, hard-cooked eggs  to salads and dressings, vegetables, casseroles, and creamed meats   Beat eggs into mashed potatoes, vegetable purees, and sauces   Add extra egg whites to quiches, scrambled eggs, custards, puddings, pancake batter, or [place] toast wash/batter   Make a rich custard with egg yolks, double strength milk, and sugar   Add extra hard-cooked yolks to deviled egg filling and sandwich spreads  Hard or Semi-Soft Cheese (Cheddar, Ree Kida, [place])  Melt on sandwiches, bread, muffins, tortillas, hamburgers, hot dogs, other meats or fish, vegetables, eggs, or desserts such as stewed figs or pies   Grate and add to soups, sauces, casseroles, vegetable dishes, potatoes, rice noodles, or meatloaf   Serve as a snack with crackers or bagels  Ice cream, Yogurt, and Frozen Yogurt  Add to milk drinks such as milkshakes   Add to cereals, fruits, gelatin desserts, and pies   Blend or whip with soft or cooked fruits   Sandwich ice cream or frozen yogurt between enriched cake slices, cookies, or graham crackers   Use seasoned yogurt as a dip for fruits, vegetables, or chips   Use yogurt in place of sour cream in casseroles  Meat and Fish  Add chopped, cooked meat or fish to vegetables, salads, casseroles, soups, sauces, and biscuit dough   Use in omelets, souffls, quiches, and sandwich fillings   Add chicken and [place] to stuffing   Wrap in pie crust or biscuit dough  as turnovers   Add to stuffed baked potatoes   Add pureed meat to soups  Milk  Use in beverages and in cooking   Use in preparing foods, such as hot cereal, soups, cocoa, or pudding   Add cream sauces to vegetable and other dishes   Use evaporated milk, evaporated skim milk, or sweetened condensed milk instead of milk or water in recipes.  Nonfat Dry Milk  Add 1/3 cup of nonfat dry milk powdered milk to each cup of regular milk for double strength milk   Add to yogurt and milk drinks, such as pasteurized eggnog and milkshakes     Add to scrambled eggs and mashed potatoes   Use in casseroles, meatloaf, hot cereal, breads, muffins, sauces, cream soups, puddings and custards, and other milk-based desserts  Nuts, Seeds, and Wheat Germ  Add to casseroles, breads, muffins, pancakes, cookies, and waffles   Sprinkle on fruit, cereal, ice cream, yogurt, vegetables, salads, and toast as a crunchy topping   Use in place of breadcrumbs   Blend with parsley or spinach, herbs, and cream for a noodle, pasta, or vegetable sauce.   Roll banana in chopped nuts  Peanut Butter  Spread on sandwiches, toast, muffins, crackers, waffles, pancakes, and fruit slices   Use as a dip for raw vegetables, such as carrots, cauliflower, and celery   Blend with milk drinks, smoothies, and other beverages   Swirl through soft ice cream or yogurt   Spread on a banana then roll in crushed, dry cereal or chopped nuts   Copyright 2020  Academy of [employer]. All rights reserved.

## 2019-04-20 NOTE — Progress Notes (Signed)
Nutrition Follow-up  RD working remotely.  DOCUMENTATION CODES:   Not applicable  INTERVENTION:   -D/c Ensure Max -Continue MVI with minerals daily -RD provided "Tips for Adding Protein" handout from [facility] Nutrition Care Manual; RD attached information to discharge instructions/AVS pt to to access at discharge  NUTRITION DIAGNOSIS:   Increased nutrient needs related to wound healing as evidenced by estimated needs.  Ongoing  GOAL:   Patient will meet greater than or equal to 90% of their needs  Progressing  MONITOR:   PO intake, Supplement acceptance, Labs, Weight trends, Skin, I & O's  REASON FOR ASSESSMENT:   Consult Assessment of nutrition requirement/status  ASSESSMENT:   48 yo gentleman who is seen for post operative follow up following left foot 5th ray amputation on 03/09/2019. He comes in today with opening of the incision and drainage, with necrotic soft tissue and exposed bone. He reports he has been walking on the foot at lot more over the past week as his father was sick in the hospital and passed away and died last 04-24-23.   5/6- s/p lt BKA and wound vac placement  Reviewed I/O's: +100 ml x 24 hours and +250 ml since admission  UOP: 400 ml x 24 hours  RD consulted for food preferences. Per orthopedics notes, pt with poor oral intake as he does not like the food options provided to him here. Noted meal completion 25-100%. RD ordered pt Ensure Max supplements yesterday for increased protein needs, however, pt has been refusing these.   Per RN notes, pt is discharging home today. He has refused CIR admission. Suspect intake will improve at home, where pt will have access to more familiar foods. Since RN has already discharged pt, RD did not speak with pt at this time.   Labs reviewed: CBGS: 82-147 (inpatient orders for glycemic control are 750 mg metformin BID, 0-15 units insulin aspart TID with meals, 4 units insulin aspart TID with meals, and 20 units  insulin detemir q HS).   Diet Order:   Diet Order            Diet - low sodium heart healthy        Diet Carb Modified Fluid consistency: Thin; Room service appropriate? Yes  Diet effective now              EDUCATION NEEDS:   No education needs have been identified at this time  Skin:  Skin Assessment: Skin Integrity Issues: Skin Integrity Issues:: Wound VAC Wound Vac: lt BKA site  Last BM:  04/17/19  Height:   Ht Readings from Last 1 Encounters:  04/18/19 6\' 2"  (1.88 m)    Weight:   Wt Readings from Last 1 Encounters:  04/18/19 97.5 kg    Ideal Body Weight:  80.8 kg  BMI:  Body mass index is 27.6 kg/m.  Estimated Nutritional Needs:   Kcal:  2200-2400  Protein:  120-135 grams  Fluid:  > 2.2 L    Taysom Glymph A. Mayford Knife, RD, LDN, CDCES Registered Dietitian II Certified Diabetes Care and Education Specialist Pager: [phone] After hours Pager: [phone]

## 2019-04-20 NOTE — Progress Notes (Signed)
Orthopedic Tech Progress Note Patient Details:  Travis [name] 1971/09/25 [id number]  Patient ID: [name], male   DOB: 19-Nov-1971, 48 y.o.   MRN: [id number]   [name] 04/20/2019, 8:47 AMCalled [facility] for left stump shrinker and limb protector BKA.

## 2019-04-23 ENCOUNTER — Ambulatory Visit (INDEPENDENT_AMBULATORY_CARE_PROVIDER_SITE_OTHER): Payer: Medicare Other | Admitting: *Deleted

## 2019-04-23 ENCOUNTER — Other Ambulatory Visit: Payer: Self-pay

## 2019-04-23 ENCOUNTER — Telehealth: Payer: Self-pay | Admitting: Orthopedic Surgery

## 2019-04-23 DIAGNOSIS — I639 Cerebral infarction, unspecified: Secondary | ICD-10-CM

## 2019-04-23 DIAGNOSIS — I5042 Chronic combined systolic (congestive) and diastolic (congestive) heart failure: Secondary | ICD-10-CM

## 2019-04-23 LAB — CUP PACEART REMOTE DEVICE CHECK
Date Time Interrogation Session: 20200509201018
Implantable Pulse Generator Implant Date: 20190717

## 2019-04-23 NOTE — Telephone Encounter (Signed)
Amy/[facility]/PT called and stated that she needed orders for PT  2x week for 2 week  Call Amy to advise and she stated can leave a voicemail if not available to answer [phone]

## 2019-04-23 NOTE — Telephone Encounter (Signed)
VO was given for PT for patient

## 2019-04-25 ENCOUNTER — Encounter: Payer: Self-pay | Admitting: *Deleted

## 2019-04-25 ENCOUNTER — Telehealth: Payer: Self-pay | Admitting: Cardiovascular Disease

## 2019-04-25 NOTE — Telephone Encounter (Signed)
LMTCB.  Patient has appt with Dr. Duke Salvia on May 19.  Please ask patient if he would like a virtual visit as that is all we are doing now. If yes, please change appt type,etc.  If he does not want virtual, r/s appt for first available with Duke Salvia or with PA in July./dc

## 2019-04-26 ENCOUNTER — Other Ambulatory Visit: Payer: Self-pay

## 2019-04-26 ENCOUNTER — Encounter: Payer: Self-pay | Admitting: Orthopedic Surgery

## 2019-04-26 ENCOUNTER — Ambulatory Visit (INDEPENDENT_AMBULATORY_CARE_PROVIDER_SITE_OTHER): Payer: Medicare Other | Admitting: Orthopedic Surgery

## 2019-04-26 VITALS — Ht 74.0 in | Wt 215.0 lb

## 2019-04-26 DIAGNOSIS — Z89512 Acquired absence of left leg below knee: Secondary | ICD-10-CM

## 2019-04-26 DIAGNOSIS — I255 Ischemic cardiomyopathy: Secondary | ICD-10-CM

## 2019-04-26 DIAGNOSIS — Z794 Long term (current) use of insulin: Secondary | ICD-10-CM

## 2019-04-26 DIAGNOSIS — E1142 Type 2 diabetes mellitus with diabetic polyneuropathy: Secondary | ICD-10-CM

## 2019-04-27 ENCOUNTER — Telehealth: Payer: Self-pay | Admitting: Orthopedic Surgery

## 2019-04-27 ENCOUNTER — Encounter: Payer: Self-pay | Admitting: Orthopedic Surgery

## 2019-04-27 NOTE — Telephone Encounter (Signed)
Requesting skill nursing evaluation for incision

## 2019-04-27 NOTE — Progress Notes (Signed)
Office Visit Note   Patient: Travis [name]           Date of Birth: October 13, 1971           MRN: [id number] Visit Date: 04/26/2019              Requested by: Wendee Beavers, DO [address] [place], [place] [address] PCP: Wendee Beavers, DO  Chief Complaint  Patient presents with  . Left Leg - Routine Post Op    04/18/19 left BKA      HPI: The patient is a 48 yo gentleman who is seen for post operative follow up following a left transtibial amputation on 04/18/2019. The VAC was removed today.  They are going to [facility] today for shrinker stockings and limb protector right after this visit.   Assessment & Plan: Visit Diagnoses:  1. 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 Type 2 diabetes mellitus with diabetic polyneuropathy, with long-term current use of insulin (HCC)   3. Ischemic cardiomyopathy     Plan: May wash the residual limb with soap and water daily and apply a dry gauze dressing to the area and then Ace wrap and shrinker stocking. Use limb protector once obtained.  Follow up next week.   Follow-Up Instructions: Return in about 1 week (around 05/03/2019).   Ortho Exam  Patient is alert, oriented, no adenopathy, well-dressed, normal affect, normal respiratory effort. The prevena VAC was removed and there is bloody drainage from the lateral incision line> medial. He has mild to moderate edema. Staples intact. No signs of infection or cellulitis. Lacks 5 degrees of extension. Dry dressings applied.   Imaging: No results found. No images are attached to the encounter.  Labs: Lab Results  Component Value Date   HGBA1C 6.9 (H) 12/28/2018   HGBA1C 8.0 (A) 10/23/2018   HGBA1C 6.9 (H) 06/26/2018   ESRSEDRATE 1 11/09/2016   ESRSEDRATE 24 (H) 10/06/2016   CRP 0.6 11/09/2016   CRP 6.3 (H) 10/06/2016   REPTSTATUS 11/03/2018 FINAL 10/29/2018   REPTSTATUS 11/03/2018 FINAL 10/29/2018   CULT  10/29/2018    NO GROWTH 5 DAYS Performed at  [facility], [address], [place], [place] [address]    CULT  10/29/2018    NO GROWTH 5 DAYS Performed at [facility], [address] [place], [place] [address]      Lab Results  Component Value Date   ALBUMIN 3.5 04/18/2019   ALBUMIN 4.2 12/28/2018   ALBUMIN 4.4 11/21/2018   PREALBUMIN 20.2 11/11/2015    Body mass index is 27.6 kg/m.  Orders:  No orders of the defined types were placed in this encounter.  No orders of the defined types were placed in this encounter.    Procedures: No procedures performed  Clinical Data: No additional findings.  ROS:  All other systems negative, except as noted in the HPI. Review of Systems  Objective: Vital Signs: Ht  (1.88 m)   Wt 215 lb (97.5 kg)   BMI 27.60 kg/m   Specialty Comments:  No specialty comments available.  PMFS History: Patient Active Problem List   Diagnosis Date Noted  . Below-knee amputation of left lower extremity (HCC) 04/18/2019  . Dehiscence of incision, sequela   . H/O amputation of lesser toe, left (HCC) 04/04/2019  . Foreign body (FB) in soft tissue   . Pressure injury of left foot, stage 2 (HCC) 02/14/2019  .  PVD (peripheral vascular disease) (HCC) 02/14/2019  . Snoring 02/14/2019  . S/P laparoscopic cholecystectomy 01/04/2019  . Intractable vomiting   . Sepsis (HCC) 10/29/2018  . Abnormal finding on imaging 10/29/2018  . Cholecystostomy care (HCC) 09/05/2018  . Essential hypertension 06/27/2018  . Hyperlipidemia 06/27/2018  . Diabetic retinopathy (HCC) 06/27/2018  . Leukocytosis 06/27/2018  . Ischemic stroke (HCC) - mult small B infarcts s/p tPA, unknown embolic source 06/25/2018  . Left arm numbness 03/03/2018  . Strain of right trapezius muscle 03/03/2018  . Ischemic cardiomyopathy 12/16/2017  . Major depressive disorder, recurrent episode, mild (HCC) 09/16/2017  . Acute on chronic systolic and diastolic heart failure, NYHA class 3 (HCC) 09/14/2017  . CAD in  native artery 09/14/2017  . S/P BKA (below knee amputation) unilateral, left (HCC) 11/19/2016  . Short Achilles tendon (acquired), left ankle 11/19/2016  . Subacute osteomyelitis, left ankle and foot (HCC) 10/20/2016  . Gastroparesis due to DM (HCC)   . History of diabetic ulcer of foot 11/14/2015  . Vitamin D deficiency 11/12/2015  . Osteoporosis 11/12/2015  . GERD (gastroesophageal reflux disease) 11/03/2015  . Erectile dysfunction 07/11/2009  . Allergic rhinitis 12/11/2008  . 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 Insomnia 08/24/2007  . Controlled type 2 diabetes mellitus with diabetic autonomic neuropathy, with long-term current use of insulin (HCC) 08/09/2007   Past Medical History:  Diagnosis Date  . Acute on chronic systolic and diastolic heart failure, NYHA class 3 (HCC) 09/14/2017  . Acute osteomyelitis of metatarsal bone of left foot (HCC) 11/12/2015  . CAD in native artery 09/14/2017  . Closed fracture of right tibial plateau 11/11/2015  . Coronary artery disease   . Dehiscence of amputation stump (HCC)    left leg  . Depression with anxiety   . Diabetes mellitus    INSULIN DEPENDENT Type II  . Diabetic peripheral neuropathy associated with type 2 diabetes mellitus (HCC) 08/30/2008   Qualifier: Diagnosis of  By: Daphine DeutscherMartin FNP, Zena AmosNykedtra    . Diabetic ulcer of left foot (HCC) 11/14/2015  . Gastroparesis   . GERD (gastroesophageal reflux disease)   . History of loop recorder 06/2018   Last remote device check was on 02/14/2019  . Hypertension    patient denies  . Left ventricular aneurysm    REPORTS HE IS NOT AWAREW OF THIS   . Myocardial infarction (HCC)   . Osteomyelitis (HCC)   . Osteoporosis 11/12/2015  . Peripheral neuropathy    feet and below bilateral knees  . Peripheral vascular disease (HCC)    PAD in Left leg and some in right leg  . Shortness of breath dyspnea   . Stroke (HCC) 05/2018  . Vitamin D deficiency  11/12/2015    Family History  Problem Relation Age of Onset  . Cancer Paternal Grandmother   . COPD Mother   . Heart failure Mother   . Anxiety disorder Mother   . Depression Mother   . Fibromyalgia Mother   . Esophageal cancer Father        Smoker, still does  . Diabetes Sister   . Fibromyalgia Sister   . Diabetes Maternal Uncle   . Diabetes Maternal Grandmother   . GER disease Sister   . Liver disease Paternal Grandfather   . Hemachromatosis Paternal Grandfather   . Diabetes Maternal Grandfather     Past Surgical History:  Procedure Laterality Date  . AMPUTATION Left 11/28/2015   Procedure: Left 4th Ray Amputation;  Surgeon: Nadara Mustard, MD;  Location: [facility] OR;  Service: Orthopedics;  Laterality: Left;  . AMPUTATION Left 10/29/2016   Procedure: LEFT 5TH RAY AMPUTATION;  Surgeon: Nadara Mustard, MD;  Location: [facility] OR;  Service: Orthopedics;  Laterality: Left;  . AMPUTATION Left 03/09/2019   Procedure: LEFT FOOT 5TH RAY AMPUTATION;  Surgeon: Nadara Mustard, MD;  Location: [facility] OR;  Service: Orthopedics;  Laterality: Left;  . AMPUTATION Left 04/18/2019   Procedure: LEFT BELOW KNEE AMPUTATION;  Surgeon: Nadara Mustard, MD;  Location: [facility] OR;  Service: Orthopedics;  Laterality: Left;  . ANTERIOR VITRECTOMY Left 04/12/2016   Procedure: ANTERIOR CHAMBER WASH OUT WITH GAS FLUID EXCHANGE;  Surgeon: Carmela Rima, MD;  Location: [facility] OR;  Service: Ophthalmology;  Laterality: Left;  . CARDIAC CATHETERIZATION  10/2017  . CATARACT EXTRACTION Left   . CHOLECYSTECTOMY N/A 01/04/2019   Procedure: LAPAROSCOPIC CHOLECYSTECTOMY;  Surgeon: Gaynelle Adu, MD;  Location: [facility] ORS;  Service: General;  Laterality: N/A;  . CORONARY ARTERY BYPASS GRAFT N/A 11/29/2017    LIMA-LAD, SVG-PDA, SVG-CFX  Procedure: CORONARY ARTERY BYPASS GRAFTING (CABG) times three using the left saphaneous vien. harvested endoscopicly and left internal mammary artery.;  Surgeon: Kerin Perna, MD;  Location: [facility] OR;  Service: Open Heart  Surgery;  Laterality: N/A;  . EYE SURGERY     left eye surgery 04/2016  . IR CHOLANGIOGRAM EXISTING TUBE  10/17/2018  . IR CHOLANGIOGRAM EXISTING TUBE  10/30/2018  . IR PERC CHOLECYSTOSTOMY  09/05/2018  . KNEE SURGERY Left   . LOOP RECORDER INSERTION N/A 06/28/2018   Procedure: LOOP RECORDER INSERTION;  Surgeon: Duke Salvia, MD;  Location: [facility] INVASIVE CV LAB;  Service: Cardiovascular;  Laterality: N/A;  . RIGHT/LEFT HEART CATH AND CORONARY ANGIOGRAPHY N/A 10/19/2017   Procedure: RIGHT/LEFT HEART CATH AND CORONARY ANGIOGRAPHY;  Surgeon: [place]az[place], [name], MD;  Location: [facility] INVASIVE CV LAB;  Service: Cardiovascular;  Laterality: N/A;  . SHOULDER SURGERY Right   . TEE WITHOUT CARDIOVERSION N/A 11/29/2017   Procedure: TRANSESOPHAGEAL ECHOCARDIOGRAM (TEE);  Surgeon: Donata Clay, Theron Arista, MD;  Location: [facility] OR;  Service: Open Heart Surgery;  Laterality: N/A;  . TEE WITHOUT CARDIOVERSION N/A 06/28/2018   Procedure: TRANSESOPHAGEAL ECHOCARDIOGRAM (TEE);  Surgeon: Jake Bathe, MD;  Location: [facility] ENDOSCOPY;  Service: Cardiovascular;  Laterality: N/A;  . TOE AMPUTATION Left 11/28/15   4th toe   Social History   Occupational History  . Occupation: Disabled  Tobacco Use  . Smoking status: Never Smoker  . Smokeless tobacco: Never Used  Substance and Sexual Activity  . Alcohol use: No  . Drug use: No  . Sexual activity: Yes

## 2019-04-30 ENCOUNTER — Other Ambulatory Visit (INDEPENDENT_AMBULATORY_CARE_PROVIDER_SITE_OTHER): Payer: Self-pay

## 2019-04-30 NOTE — Telephone Encounter (Signed)
PT (Amy ) called and was requesting a HHN to help patient's with his BKA dressing changes but I explained that patient insurance will not pay for dry dressing changes for HHn to go out and she states she will call family and let them know. Patient has appt in our office on 05/03/2019.

## 2019-05-01 ENCOUNTER — Ambulatory Visit: Payer: Medicare Other | Admitting: Cardiovascular Disease

## 2019-05-02 NOTE — Progress Notes (Signed)
[facility] Summary Report / Loop Recorder 

## 2019-05-03 ENCOUNTER — Ambulatory Visit: Payer: Medicare Other | Admitting: Orthopedic Surgery

## 2019-05-04 ENCOUNTER — Other Ambulatory Visit: Payer: Self-pay | Admitting: Neurology

## 2019-05-08 ENCOUNTER — Encounter: Payer: Self-pay | Admitting: Orthopedic Surgery

## 2019-05-08 ENCOUNTER — Other Ambulatory Visit: Payer: Self-pay

## 2019-05-08 ENCOUNTER — Ambulatory Visit (INDEPENDENT_AMBULATORY_CARE_PROVIDER_SITE_OTHER): Payer: Medicare Other | Admitting: Orthopedic Surgery

## 2019-05-08 VITALS — Ht 74.0 in | Wt 215.0 lb

## 2019-05-08 DIAGNOSIS — Z89512 Acquired absence of left leg below knee: Secondary | ICD-10-CM

## 2019-05-08 DIAGNOSIS — Z794 Long term (current) use of insulin: Secondary | ICD-10-CM

## 2019-05-08 DIAGNOSIS — E1142 Type 2 diabetes mellitus with diabetic polyneuropathy: Secondary | ICD-10-CM

## 2019-05-08 DIAGNOSIS — I255 Ischemic cardiomyopathy: Secondary | ICD-10-CM

## 2019-05-08 NOTE — Telephone Encounter (Signed)
Thanks Dr. Abelardo Diesel for replying and refilling. Have a good day.

## 2019-05-08 NOTE — Progress Notes (Signed)
Office Visit Note   Patient: Travis [name]           Date of Birth: Nov 02, 1971           MRN: [id number] Visit Date: 05/08/2019              Requested by: Wendee Beavers, DO [address] [place], [place] [address] PCP: Wendee Beavers, DO  Chief Complaint  Patient presents with  . Left Leg - Routine Post Op    04/18/19 left BKA       HPI: The patient is a 48 yo gentleman who is seen for post operative follow up following a left transtibial amputation on 04/18/2019.  He reports he fell about 2 weeks ago when he was getting out of bed during the night, but has not noted any increase in drainage or pain following the fall. He is wearing his shrinker and limb protector.  He is working with [facility].    Assessment & Plan: Visit Diagnoses:  1. 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 Type 2 diabetes mellitus with diabetic polyneuropathy, with long-term current use of insulin (HCC)   3. Ischemic cardiomyopathy     Plan: Continue washing the left transtibial amputation site daily with soap and water and apply Ace wrap and shrinker stocking. Continue limb protector.  Follow up end of next week and may be able to harvest staples at that time.   Follow-Up Instructions: Return in about 10 days (around 05/18/2019).   Ortho Exam  Patient is alert, oriented, no adenopathy, well-dressed, normal affect, normal respiratory effort. The left transtibial amputation incision is intact with staples in place. Very slight heme drainage from lateral incision. No signs of infection or cellulitis. Maturing ecchymosis over the distal residual limb. Full knee extension.  Imaging: No results found.   Labs: Lab Results  Component Value Date   HGBA1C 6.9 (H) 12/28/2018   HGBA1C 8.0 (A) 10/23/2018   HGBA1C 6.9 (H) 06/26/2018   ESRSEDRATE 1 11/09/2016   ESRSEDRATE 24 (H) 10/06/2016   CRP 0.6 11/09/2016   CRP 6.3 (H) 10/06/2016   REPTSTATUS 11/03/2018 FINAL 10/29/2018    REPTSTATUS 11/03/2018 FINAL 10/29/2018   CULT  10/29/2018    NO GROWTH 5 DAYS Performed at [facility] Lab, [address], [place], [place] [address]    CULT  10/29/2018    NO GROWTH 5 DAYS Performed at [facility] Lab, [address] [place], [place] [address]      Lab Results  Component Value Date   ALBUMIN 3.5 04/18/2019   ALBUMIN 4.2 12/28/2018   ALBUMIN 4.4 11/21/2018   PREALBUMIN 20.2 11/11/2015    Body mass index is 27.6 kg/m.  Orders:  No orders of the defined types were placed in this encounter.  No orders of the defined types were placed in this encounter.    Procedures: No procedures performed  Clinical Data: No additional findings.  ROS:  All other systems negative, except as noted in the HPI. Review of Systems  Objective: Vital Signs: Ht 6\' 2"  (1.88 m)   Wt 215 lb (97.5 kg)   BMI 27.60 kg/m   Specialty Comments:  No specialty comments available.  PMFS History: Patient Active Problem List   Diagnosis Date Noted  . Below-knee amputation of left lower extremity (HCC) 04/18/2019  . Dehiscence of incision, sequela   . H/O amputation of lesser toe, left (HCC) 04/04/2019  . Foreign body (  FB) in soft tissue   . Pressure injury of left foot, stage 2 (HCC) 02/14/2019  . PVD (peripheral vascular disease) (HCC) 02/14/2019  . Snoring 02/14/2019  . S/P laparoscopic cholecystectomy 01/04/2019  . Intractable vomiting   . Sepsis (HCC) 10/29/2018  . Abnormal finding on imaging 10/29/2018  . Cholecystostomy care (HCC) 09/05/2018  . Essential hypertension 06/27/2018  . Hyperlipidemia 06/27/2018  . Diabetic retinopathy (HCC) 06/27/2018  . Leukocytosis 06/27/2018  . Ischemic stroke (HCC) - mult small B infarcts s/p tPA, unknown embolic source 06/25/2018  . Left arm numbness 03/03/2018  . Strain of right trapezius muscle 03/03/2018  . Ischemic cardiomyopathy 12/16/2017  . Major depressive disorder, recurrent episode, mild (HCC) 09/16/2017  .  Acute on chronic systolic and diastolic heart failure, NYHA class 3 (HCC) 09/14/2017  . CAD in native artery 09/14/2017  . S/P BKA (below knee amputation) unilateral, left (HCC) 11/19/2016  . Short Achilles tendon (acquired), left ankle 11/19/2016  . Subacute osteomyelitis, left ankle and foot (HCC) 10/20/2016  . Gastroparesis due to DM (HCC)   . History of diabetic ulcer of foot 11/14/2015  . Vitamin D deficiency 11/12/2015  . Osteoporosis 11/12/2015  . GERD (gastroesophageal reflux disease) 11/03/2015  . Erectile dysfunction 07/11/2009  . Allergic rhinitis 12/11/2008  . 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 Insomnia 08/24/2007  . Controlled type 2 diabetes mellitus with diabetic autonomic neuropathy, with long-term current use of insulin (HCC) 08/09/2007   Past Medical History:  Diagnosis Date  . Acute on chronic systolic and diastolic heart failure, NYHA class 3 (HCC) 09/14/2017  . Acute osteomyelitis of metatarsal bone of left foot (HCC) 11/12/2015  . CAD in native artery 09/14/2017  . Closed fracture of right tibial plateau 11/11/2015  . Coronary artery disease   . Dehiscence of amputation stump (HCC)    left leg  . Depression with anxiety   . Diabetes mellitus    INSULIN DEPENDENT Type II  . Diabetic peripheral neuropathy associated with type 2 diabetes mellitus (HCC) 08/30/2008   Qualifier: Diagnosis of  By: Daphine Deutscher FNP, Zena Amos    . Diabetic ulcer of left foot (HCC) 11/14/2015  . Gastroparesis   . GERD (gastroesophageal reflux disease)   . History of loop recorder 06/2018   Last remote device check was on 02/14/2019  . Hypertension    patient denies  . Left ventricular aneurysm    REPORTS HE IS NOT AWAREW OF THIS   . Myocardial infarction (HCC)   . Osteomyelitis (HCC)   . Osteoporosis 11/12/2015  . Peripheral neuropathy    feet and below bilateral knees  . Peripheral vascular disease (HCC)    PAD in Left leg and some  in right leg  . Shortness of breath dyspnea   . Stroke (HCC) 05/2018  . Vitamin D deficiency 11/12/2015    Family History  Problem Relation Age of Onset  . Cancer Paternal Grandmother   . COPD Mother   . Heart failure Mother   . Anxiety disorder Mother   . Depression Mother   . Fibromyalgia Mother   . Esophageal cancer Father        Smoker, still does  . Diabetes Sister   . Fibromyalgia Sister   . Diabetes Maternal Uncle   . Diabetes Maternal Grandmother   . GER disease Sister   . Liver disease Paternal Grandfather   . Hemachromatosis Paternal Grandfather   . Diabetes Maternal Grandfather     Past Surgical  History:  Procedure Laterality Date  . AMPUTATION Left 11/28/2015   Procedure: Left 4th Ray Amputation;  Surgeon: Nadara MustardMarcus Duda V, MD;  [facility];  Service: Orthopedics;  Laterality: Left;  . AMPUTATION Left 10/29/2016   Procedure: LEFT 5TH RAY AMPUTATION;  Surgeon: Nadara MustardMarcus V Duda, MD;  [facility] OR;  Service: Orthopedics;  Laterality: Left;  . AMPUTATION Left 03/09/2019   Procedure: LEFT FOOT 5TH RAY AMPUTATION;  Surgeon: Nadara Mustarduda, Marcus V, MD;  [facility];  Service: Orthopedics;  Laterality: Left;  . AMPUTATION Left 04/18/2019   Procedure: LEFT BELOW KNEE AMPUTATION;  Surgeon: Nadara Mustarduda, Marcus V, MD;  [facility];  Service: Orthopedics;  Laterality: Left;  . ANTERIOR VITRECTOMY Left 04/12/2016   Procedure: ANTERIOR CHAMBER WASH OUT WITH GAS FLUID EXCHANGE;  Surgeon: Carmela RimaNarendra Patel, MD;  [facility];  Service: Ophthalmology;  Laterality: Left;  . CARDIAC CATHETERIZATION  10/2017  . CATARACT EXTRACTION Left   . CHOLECYSTECTOMY N/A 01/04/2019   Procedure: LAPAROSCOPIC CHOLECYSTECTOMY;  Surgeon: Gaynelle AduWilson, Eric, MD;  Location: [facility] ORS;  Service: General;  Laterality: N/A;  . CORONARY ARTERY BYPASS GRAFT N/A 11/29/2017    LIMA-LAD, SVG-PDA, SVG-CFX  Procedure: CORONARY ARTERY BYPASS GRAFTING (CABG) times three using the left saphaneous vien. harvested endoscopicly and left  internal mammary artery.;  Surgeon: Kerin PernaVan Trigt, Peter, MD;  [facility];  Service: Open Heart Surgery;  Laterality: N/A;  . EYE SURGERY     left eye surgery 04/2016  . IR CHOLANGIOGRAM EXISTING TUBE  10/17/2018  . IR CHOLANGIOGRAM EXISTING TUBE  10/30/2018  . IR PERC CHOLECYSTOSTOMY  09/05/2018  . KNEE SURGERY Left   . LOOP RECORDER INSERTION N/A 06/28/2018   Procedure: LOOP RECORDER INSERTION;  Surgeon: Duke SalviaKlein, Steven C, MD;  [facility];  Service: Cardiovascular;  Laterality: N/A;  . RIGHT/LEFT HEART CATH AND CORONARY ANGIOGRAPHY N/A 10/19/2017   Procedure: RIGHT/LEFT HEART CATH AND CORONARY ANGIOGRAPHY;  Surgeon: SwazilandJordan, [name], MD;  [facility];  Service: Cardiovascular;  Laterality: N/A;  . SHOULDER SURGERY Right   . TEE WITHOUT CARDIOVERSION N/A 11/29/2017   Procedure: TRANSESOPHAGEAL ECHOCARDIOGRAM (TEE);  Surgeon: Donata ClayVan Trigt, Theron AristaPeter, MD;  [facility];  Service: Open Heart Surgery;  Laterality: N/A;  . TEE WITHOUT CARDIOVERSION N/A 06/28/2018   Procedure: TRANSESOPHAGEAL ECHOCARDIOGRAM (TEE);  Surgeon: Jake BatheSkains, Mark C, MD;  [facility];  Service: Cardiovascular;  Laterality: N/A;  . TOE AMPUTATION Left 11/28/15   4th toe   Social History   Occupational History  . Occupation: Disabled  Tobacco Use  . Smoking status: Never Smoker  . Smokeless tobacco: Never Used  Substance and Sexual Activity  . Alcohol use: No  . Drug use: No  . Sexual activity: Yes

## 2019-05-09 ENCOUNTER — Ambulatory Visit: Payer: Medicare Other | Admitting: Orthopedic Surgery

## 2019-05-12 ENCOUNTER — Telehealth: Payer: Self-pay | Admitting: Cardiology

## 2019-05-12 ENCOUNTER — Telehealth: Payer: Self-pay | Admitting: Adult Health

## 2019-05-12 NOTE — Telephone Encounter (Signed)
LEft message to call back regarding recent ortho care visit.

## 2019-05-12 NOTE — Telephone Encounter (Signed)
Left message on cell number to have patient call back [phone], call was in regards to recent office visit 5/26 and possible exposure to Covid and getting testing.

## 2019-05-17 ENCOUNTER — Encounter: Payer: Self-pay | Admitting: Orthopedic Surgery

## 2019-05-17 ENCOUNTER — Other Ambulatory Visit: Payer: Self-pay

## 2019-05-17 ENCOUNTER — Ambulatory Visit (INDEPENDENT_AMBULATORY_CARE_PROVIDER_SITE_OTHER): Payer: Medicare Other | Admitting: Orthopedic Surgery

## 2019-05-17 VITALS — Ht 74.0 in | Wt 215.0 lb

## 2019-05-17 DIAGNOSIS — Z89512 Acquired absence of left leg below knee: Secondary | ICD-10-CM

## 2019-05-17 NOTE — Progress Notes (Addendum)
Office Visit Note   Patient: Travis [name]           Date of Birth: July 28, 1971           MRN: [id number] Visit Date: 05/17/2019              Requested by: Wendee Beavers, DO [address] [place], [place] [address] PCP: Wendee Beavers, DO  Chief Complaint  Patient presents with  . Left Leg - Routine Post Op    04/18/2019 left BKA      HPI: Patient is a 48 year old gentleman who presents 3 weeks status post left transtibial amputation.  Patient has been wearing his stump shrinker and limb protector.  Staples are intact.  Patient denies any problems.  Assessment & Plan: Visit Diagnoses:  1. 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  Patient should be able to be fit for his prosthesis in about a week.  Staples harvested today.  ADDEN: The patient requires a light weight wheelchair for mobility as well as safe completion of ADL's following his left transtibial amputation.  Follow-Up Instructions: Return in about 3 weeks (around 06/07/2019).   Ortho Exam  Patient is alert, oriented, no adenopathy, well-dressed, normal affect, normal respiratory effort. Examination patient has full range of motion of the left knee.  The incision is well-healed there is no swelling no signs of infection staples are harvested.  Patient does have some phantom pain and he was given instructions for scar massage manipulation to desensitize his leg.  Imaging: No results found.   Labs: Lab Results  Component Value Date   HGBA1C 6.9 (H) 12/28/2018   HGBA1C 8.0 (A) 10/23/2018   HGBA1C 6.9 (H) 06/26/2018   ESRSEDRATE 1 11/09/2016   ESRSEDRATE 24 (H) 10/06/2016   CRP 0.6 11/09/2016   CRP 6.3 (H) 10/06/2016   REPTSTATUS 11/03/2018 FINAL 10/29/2018   REPTSTATUS 11/03/2018 FINAL 10/29/2018   CULT  10/29/2018    NO GROWTH 5 DAYS Performed at [facility] Lab, [address] [place], [place]  [address]    CULT  10/29/2018    NO GROWTH 5 DAYS Performed at [facility] Lab, [address], [place], [place] [address]      Lab Results  Component Value Date   ALBUMIN 3.5 04/18/2019   ALBUMIN 4.2 12/28/2018   ALBUMIN 4.4 11/21/2018   PREALBUMIN 20.2 11/11/2015    Body mass index is 27.6 kg/m.  Orders:  No orders of the defined types were placed in this encounter.  No orders of the defined types were placed in this encounter.    Procedures: No procedures performed  Clinical Data: No additional findings.  ROS:  All other systems negative, except as noted in the HPI. Review of Systems  Objective: Vital Signs: Ht  (1.88 m)   Wt 215 lb (97.5 kg)   BMI 27.60 kg/m   Specialty Comments:  No specialty comments available.  PMFS History: Patient Active Problem List   Diagnosis Date Noted  . Below-knee amputation of left lower extremity (HCC) 04/18/2019  . Dehiscence of incision, sequela   . H/O amputation of lesser toe, left (HCC) 04/04/2019  . Foreign body (FB) in soft tissue   . Pressure injury of left foot, stage 2 (HCC) 02/14/2019  . PVD (peripheral vascular disease) (HCC) 02/14/2019  . Snoring 02/14/2019  . S/P laparoscopic cholecystectomy 01/04/2019  .  Intractable vomiting   . Sepsis (HCC) 10/29/2018  . Abnormal finding on imaging 10/29/2018  . Cholecystostomy care (HCC) 09/05/2018  . Essential hypertension 06/27/2018  . Hyperlipidemia 06/27/2018  . Diabetic retinopathy (HCC) 06/27/2018  . Leukocytosis 06/27/2018  . Ischemic stroke (HCC) - mult small B infarcts s/p tPA, unknown embolic source 06/25/2018  . Left arm numbness 03/03/2018  . Strain of right trapezius muscle 03/03/2018  . Ischemic cardiomyopathy 12/16/2017  . Major depressive disorder, recurrent episode, mild (HCC) 09/16/2017  . Acute on chronic systolic and diastolic heart failure, NYHA class 3 (HCC) 09/14/2017  . CAD in native artery 09/14/2017  . S/P BKA (below knee  amputation) unilateral, left (HCC) 11/19/2016  . Short Achilles tendon (acquired), left ankle 11/19/2016  . Subacute osteomyelitis, left ankle and foot (HCC) 10/20/2016  . Gastroparesis due to DM (HCC)   . History of diabetic ulcer of foot 11/14/2015  . Vitamin D deficiency 11/12/2015  . Osteoporosis 11/12/2015  . GERD (gastroesophageal reflux disease) 11/03/2015  . Erectile dysfunction 07/11/2009  . Allergic rhinitis 12/11/2008  . 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 Insomnia 08/24/2007  . Controlled type 2 diabetes mellitus with diabetic autonomic neuropathy, with long-term current use of insulin (HCC) 08/09/2007   Past Medical History:  Diagnosis Date  . Acute on chronic systolic and diastolic heart failure, NYHA class 3 (HCC) 09/14/2017  . Acute osteomyelitis of metatarsal bone of left foot (HCC) 11/12/2015  . CAD in native artery 09/14/2017  . Closed fracture of right tibial plateau 11/11/2015  . Coronary artery disease   . Dehiscence of amputation stump (HCC)    left leg  . Depression with anxiety   . Diabetes mellitus    INSULIN DEPENDENT Type II  . Diabetic peripheral neuropathy associated with type 2 diabetes mellitus (HCC) 08/30/2008   Qualifier: Diagnosis of  By: Daphine DeutscherMartin FNP, Zena AmosNykedtra    . Diabetic ulcer of left foot (HCC) 11/14/2015  . Gastroparesis   . GERD (gastroesophageal reflux disease)   . History of loop recorder 06/2018   Last remote device check was on 02/14/2019  . Hypertension    patient denies  . Left ventricular aneurysm    REPORTS HE IS NOT AWAREW OF THIS   . Myocardial infarction (HCC)   . Osteomyelitis (HCC)   . Osteoporosis 11/12/2015  . Peripheral neuropathy    feet and below bilateral knees  . Peripheral vascular disease (HCC)    PAD in Left leg and some in right leg  . Shortness of breath dyspnea   . Stroke (HCC) 05/2018  . Vitamin D deficiency 11/12/2015    Family History  Problem  Relation Age of Onset  . Cancer Paternal Grandmother   . COPD Mother   . Heart failure Mother   . Anxiety disorder Mother   . Depression Mother   . Fibromyalgia Mother   . Esophageal cancer Father        Smoker, still does  . Diabetes Sister   . Fibromyalgia Sister   . Diabetes Maternal Uncle   . Diabetes Maternal Grandmother   . GER disease Sister   . Liver disease Paternal Grandfather   . Hemachromatosis Paternal Grandfather   . Diabetes Maternal Grandfather     Past Surgical History:  Procedure Laterality Date  . AMPUTATION Left 11/28/2015   Procedure: Left 4th Ray Amputation;  Surgeon: Nadara MustardMarcus Cejay Cambre V, MD;  [facility];  Service: Orthopedics;  Laterality: Left;  . AMPUTATION  Left 10/29/2016   Procedure: LEFT 5TH RAY AMPUTATION;  Surgeon: Nadara Mustard, MD;  [facility] OR;  Service: Orthopedics;  Laterality: Left;  . AMPUTATION Left 03/09/2019   Procedure: LEFT FOOT 5TH RAY AMPUTATION;  Surgeon: Nadara Mustard, MD;  Location: [facility] OR;  Service: Orthopedics;  Laterality: Left;  . AMPUTATION Left 04/18/2019   Procedure: LEFT BELOW KNEE AMPUTATION;  Surgeon: Nadara Mustard, MD;  Location[facility] OR;  Service: Orthopedics;  Laterality: Left;  . ANTERIOR VITRECTOMY Left 04/12/2016   Procedure: ANTERIOR CHAMBER WASH OUT WITH GAS FLUID EXCHANGE;  Surgeon: Carmela Rima, MD;  Location: [facility] OR;  Service: Ophthalmology;  Laterality: Left;  . CARDIAC CATHETERIZATION  10/2017  . CATARACT EXTRACTION Left   . CHOLECYSTECTOMY N/A 01/04/2019   Procedure: LAPAROSCOPIC CHOLECYSTECTOMY;  Surgeon: Gaynelle Adu, MD;  Location: [facility] ORS;  Service: General;  Laterality: N/A;  . CORONARY ARTERY BYPASS GRAFT N/A 11/29/2017    LIMA-LAD, SVG-PDA, SVG-CFX  Procedure: CORONARY ARTERY BYPASS GRAFTING (CABG) times three using the left saphaneous vien. harvested endoscopicly and left internal mammary artery.;  Surgeon: Kerin Perna, MD;  [facility] OR;  Service: Open Heart Surgery;  Laterality: N/A;  . EYE  SURGERY     left eye surgery 04/2016  . IR CHOLANGIOGRAM EXISTING TUBE  10/17/2018  . IR CHOLANGIOGRAM EXISTING TUBE  10/30/2018  . IR PERC CHOLECYSTOSTOMY  09/05/2018  . KNEE SURGERY Left   . LOOP RECORDER INSERTION N/A 06/28/2018   Procedure: LOOP RECORDER INSERTION;  Surgeon: Duke Salvia, MD;  Location: [facility] INVASIVE CV LAB;  Service: Cardiovascular;  Laterality: N/A;  . RIGHT/LEFT HEART CATH AND CORONARY ANGIOGRAPHY N/A 10/19/2017   Procedure: RIGHT/LEFT HEART CATH AND CORONARY ANGIOGRAPHY;  Surgeon: Swaziland, [name], MD;  Location: [facility] INVASIVE CV LAB;  Service: Cardiovascular;  Laterality: N/A;  . SHOULDER SURGERY Right   . TEE WITHOUT CARDIOVERSION N/A 11/29/2017   Procedure: TRANSESOPHAGEAL ECHOCARDIOGRAM (TEE);  Surgeon: Donata Clay, Theron Arista, MD;  Location: [facility] OR;  Service: Open Heart Surgery;  Laterality: N/A;  . TEE WITHOUT CARDIOVERSION N/A 06/28/2018   Procedure: TRANSESOPHAGEAL ECHOCARDIOGRAM (TEE);  Surgeon: Jake Bathe, MD;  Location: [facility] ENDOSCOPY;  Service: Cardiovascular;  Laterality: N/A;  . TOE AMPUTATION Left 11/28/15   4th toe   Social History   Occupational History  . Occupation: Disabled  Tobacco Use  . Smoking status: Never Smoker  . Smokeless tobacco: Never Used  Substance and Sexual Activity  . Alcohol use: No  . Drug use: No  . Sexual activity: Yes

## 2019-05-24 ENCOUNTER — Ambulatory Visit (INDEPENDENT_AMBULATORY_CARE_PROVIDER_SITE_OTHER): Payer: Medicare Other | Admitting: *Deleted

## 2019-05-24 DIAGNOSIS — I639 Cerebral infarction, unspecified: Secondary | ICD-10-CM

## 2019-05-25 LAB — CUP PACEART REMOTE DEVICE CHECK
Date Time Interrogation Session: 20200611201001
Implantable Pulse Generator Implant Date: 20190717

## 2019-05-30 NOTE — Progress Notes (Signed)
[facility] Summary Report / Loop Recorder 

## 2019-06-07 ENCOUNTER — Ambulatory Visit: Payer: Medicare Other | Admitting: Orthopedic Surgery

## 2019-06-25 ENCOUNTER — Other Ambulatory Visit: Payer: Self-pay | Admitting: *Deleted

## 2019-06-25 MED ORDER — METOCLOPRAMIDE HCL 10 MG PO TABS: 10.0000 mg | ORAL_TABLET | Freq: Four times a day (QID) | ORAL | 1 refills | Status: DC | PRN

## 2019-06-26 ENCOUNTER — Telehealth: Payer: Self-pay | Admitting: Orthopedic Surgery

## 2019-06-26 ENCOUNTER — Ambulatory Visit (INDEPENDENT_AMBULATORY_CARE_PROVIDER_SITE_OTHER): Payer: Medicare Other | Admitting: *Deleted

## 2019-06-26 DIAGNOSIS — I639 Cerebral infarction, unspecified: Secondary | ICD-10-CM | POA: Diagnosis not present

## 2019-06-26 NOTE — Telephone Encounter (Signed)
Faxed 05/17/2019 ov note to [facility] for support need for Wheelchair [phone]

## 2019-06-27 LAB — CUP PACEART REMOTE DEVICE CHECK
Date Time Interrogation Session: 20200714203642
Implantable Pulse Generator Implant Date: 20190717

## 2019-07-11 NOTE — Progress Notes (Signed)
[facility] Summary Report / Loop Recorder 

## 2019-07-26 ENCOUNTER — Telehealth: Payer: Self-pay | Admitting: Family

## 2019-07-26 NOTE — Telephone Encounter (Signed)
Called patient left message on voicemail to return call to schedule an telephone visit with [facility] 07/31/2019

## 2019-07-30 ENCOUNTER — Ambulatory Visit (INDEPENDENT_AMBULATORY_CARE_PROVIDER_SITE_OTHER): Payer: Medicare Other | Admitting: *Deleted

## 2019-07-30 DIAGNOSIS — I639 Cerebral infarction, unspecified: Secondary | ICD-10-CM | POA: Diagnosis not present

## 2019-07-30 LAB — CUP PACEART REMOTE DEVICE CHECK
Date Time Interrogation Session: 20200816204009
Implantable Pulse Generator Implant Date: 20190717

## 2019-07-31 ENCOUNTER — Other Ambulatory Visit: Payer: Self-pay | Admitting: *Deleted

## 2019-07-31 ENCOUNTER — Telehealth (INDEPENDENT_AMBULATORY_CARE_PROVIDER_SITE_OTHER): Payer: Medicare Other | Admitting: Family

## 2019-07-31 ENCOUNTER — Other Ambulatory Visit: Payer: Self-pay | Admitting: Family Medicine

## 2019-07-31 DIAGNOSIS — E1142 Type 2 diabetes mellitus with diabetic polyneuropathy: Secondary | ICD-10-CM

## 2019-07-31 DIAGNOSIS — Z794 Long term (current) use of insulin: Secondary | ICD-10-CM

## 2019-07-31 MED ORDER — INSULIN DETEMIR 100 UNIT/ML ~~LOC~~ SOLN: 20.0000 [IU] | Freq: Every day | SUBCUTANEOUS | 2 refills | Status: DC

## 2019-07-31 MED ORDER — LANSOPRAZOLE 30 MG PO CPDR: 30.0000 mg | DELAYED_RELEASE_CAPSULE | Freq: Every day | ORAL | 3 refills | Status: DC

## 2019-07-31 NOTE — Progress Notes (Signed)
Office Visit Note   Patient: Travis [name]           Date of Birth: 09/14/1971           MRN: [id number] Visit Date: 08/01/2019              Requested by: Arlyce HarmanLockamy, Timothy, DO [address] [place],  [place] [id number] PCP: Arlyce HarmanLockamy, Timothy, DO  No chief complaint on file.     HPI: Patient is a 48 year old gentleman who presents today for a telephone visit.  The patient is status post a left transtibial amputation.  He has been doing quite well was following with [facility] for his prosthesis fabrication.  He has no concerns of his residual limb no open areas no wounds no erythema no drainage.   The patient does not have any comorbidities that will impact his mobility or ability to function using a prosthetic.  He is a new amputee that has not yet received his first p prosthesis.  He relates to us a strong desire to get into his prosthetic and get back to his activities of daily living.  He is currently in a wheelchair but hopes to progress quickly with out any assistive devices.  He will be a wonderful K3 level ambulator as he will be spending hours walking on uneven terrain in his community he will need to ascend and descend stairs and even walk backwards from time to time.  He will functionally benefit in his day-to-day life from a K3 level prosthetic.   Assessment & Plan: Visit Diagnoses:  No diagnosis found.  Plan: Patient has a relationship with [facility] and will continue for prosthetic fabrication with them.  He may follow-up in the office as needed.  Follow-Up Instructions: No follow-ups on file.   Ortho Exam  Exam deferred as visit over the phone.  Imaging: No results found.   Labs: Lab Results  Component Value Date   HGBA1C 6.9 (H) 12/28/2018   HGBA1C 8.0 (A) 10/23/2018   HGBA1C 6.9 (H) 06/26/2018   ESRSEDRATE 1 11/09/2016   ESRSEDRATE 24 (H) 10/06/2016   CRP 0.6 11/09/2016   CRP 6.3 (H) 10/06/2016   REPTSTATUS 11/03/2018 FINAL 10/29/2018   REPTSTATUS  11/03/2018 FINAL 10/29/2018   CULT  10/29/2018    NO GROWTH 5 DAYS Performed at [facility], [address] [place], [place] [address]    CULT  10/29/2018    NO GROWTH 5 DAYS Performed at [facility], [address] [place], [place] [address]      Lab Results  Component Value Date   ALBUMIN 3.5 04/18/2019   ALBUMIN 4.2 12/28/2018   ALBUMIN 4.4 11/21/2018   PREALBUMIN 20.2 11/11/2015    There is no height or weight on file to calculate BMI.  Orders:  No orders of the defined types were placed in this encounter.  No orders of the defined types were placed in this encounter.    Procedures: No procedures performed  Clinical Data: No additional findings.  ROS:  All other systems negative, except as noted in the HPI. Review of Systems  Constitutional: Negative for chills and fever.  Cardiovascular: Negative for leg swelling.  Skin: Negative for color change and wound.    Objective: Vital Signs: There were no vitals taken for this visit.  Specialty Comments:  No specialty comments available.  PMFS History: Patient Active Problem List   Diagnosis Date Noted  . Below-knee amputation of left lower extremity (HCC)  04/18/2019  . Dehiscence of incision, sequela   . H/O amputation of lesser toe, left ([place]) 04/04/2019  . Foreign body (FB) in soft tissue   . Pressure injury of left foot, stage 2 ([place]) 02/14/2019  . PVD (peripheral vascular disease) ([place]) 02/14/2019  . Snoring 02/14/2019  . S/P laparoscopic cholecystectomy 01/04/2019  . Intractable vomiting   . Sepsis ([place]) 10/29/2018  . Abnormal finding on imaging 10/29/2018  . Cholecystostomy care ([place]) 09/05/2018  . Essential hypertension 06/27/2018  . Hyperlipidemia 06/27/2018  . Diabetic retinopathy ([place]) 06/27/2018  . Leukocytosis 06/27/2018  . Ischemic stroke ([place]) - mult small B infarcts s/p tPA, unknown embolic source 16/09/9603  . Left arm numbness 03/03/2018  . Strain of right trapezius  muscle 03/03/2018  . Ischemic cardiomyopathy 12/16/2017  . Major depressive disorder, recurrent episode, mild ([place]) 09/16/2017  . Acute on chronic systolic and diastolic heart failure, NYHA class 3 ([place]) 09/14/2017  . CAD in native artery 09/14/2017  . S/P BKA (below knee amputation) unilateral, left ([place]) 11/19/2016  . Short Achilles tendon (acquired), left ankle 11/19/2016  . Subacute osteomyelitis, left ankle and foot ([place]) 10/20/2016  . Gastroparesis due to DM ([place])   . History of diabetic ulcer of foot 11/14/2015  . Vitamin D deficiency 11/12/2015  . Osteoporosis 11/12/2015  . GERD (gastroesophageal reflux disease) 11/03/2015  . Erectile dysfunction 07/11/2009  . Allergic rhinitis 12/11/2008  . Diabetic peripheral neuropathy associated with type 2 diabetes mellitus ([place]) 08/30/2008  . Insomnia 08/24/2007  . Controlled type 2 diabetes mellitus with diabetic autonomic neuropathy, with long-term current use of insulin ([place]) 08/09/2007   Past Medical History:  Diagnosis Date  . Acute on chronic systolic and diastolic heart failure, NYHA class 3 ([place]) 09/14/2017  . Acute osteomyelitis of metatarsal bone of left foot ([place]) 11/12/2015  . CAD in native artery 09/14/2017  . Closed fracture of right tibial plateau 11/11/2015  . Coronary artery disease   . Dehiscence of amputation stump (HCC)    left leg  . Depression with anxiety   . Diabetes mellitus    INSULIN DEPENDENT Type II  . Diabetic peripheral neuropathy associated with type 2 diabetes mellitus ([place]) 08/30/2008   Qualifier: Diagnosis of  By: [name] Done FNP, Tori Milks    . Diabetic ulcer of left foot ([place]) 11/14/2015  . Gastroparesis   . GERD (gastroesophageal reflux disease)   . History of loop recorder 06/2018   Last remote device check was on 02/14/2019  . Hypertension    patient denies  . Left ventricular aneurysm    REPORTS HE IS NOT AWAREW OF THIS   . Myocardial infarction ([place])   . Osteomyelitis ([place])   . Osteoporosis  11/12/2015  . Peripheral neuropathy    feet and below bilateral knees  . Peripheral vascular disease (HCC)    PAD in Left leg and some in right leg  . Shortness of breath dyspnea   . Stroke ([place]) 05/2018  . Vitamin D deficiency 11/12/2015    Family History  Problem Relation Age of Onset  . Cancer Paternal Grandmother   . COPD Mother   . Heart failure Mother   . Anxiety disorder Mother   . Depression Mother   . Fibromyalgia Mother   . Esophageal cancer Father        Smoker, still does  . Diabetes Sister   . Fibromyalgia Sister   . Diabetes Maternal Uncle   . Diabetes Maternal Grandmother   . GER disease Sister   .  Liver disease Paternal Grandfather   . Hemachromatosis Paternal Grandfather   . Diabetes Maternal Grandfather     Past Surgical History:  Procedure Laterality Date  . AMPUTATION Left 11/28/2015   Procedure: Left 4th Ray Amputation;  Surgeon: Nadara MustardMarcus Duda V, MD;  [facility];  Service: Orthopedics;  Laterality: Left;  . AMPUTATION Left 10/29/2016   Procedure: LEFT 5TH RAY AMPUTATION;  Surgeon: Nadara MustardMarcus V Duda, MD;  [facility]: [facility] OR;  Service: Orthopedics;  Laterality: Left;  . AMPUTATION Left 03/09/2019   Procedure: LEFT FOOT 5TH RAY AMPUTATION;  Surgeon: Nadara Mustarduda, Marcus V, MD;  [facility];  Service: Orthopedics;  Laterality: Left;  . AMPUTATION Left 04/18/2019   Procedure: LEFT BELOW KNEE AMPUTATION;  Surgeon: Nadara Mustarduda, Marcus V, MD;  [facility];  Service: Orthopedics;  Laterality: Left;  . ANTERIOR VITRECTOMY Left 04/12/2016   Procedure: ANTERIOR CHAMBER WASH OUT WITH GAS FLUID EXCHANGE;  Surgeon: Carmela RimaNarendra Patel, MD;  [facility];  Service: Ophthalmology;  Laterality: Left;  . CARDIAC CATHETERIZATION  10/2017  . CATARACT EXTRACTION Left   . CHOLECYSTECTOMY N/A 01/04/2019   Procedure: LAPAROSCOPIC CHOLECYSTECTOMY;  Surgeon: Gaynelle AduWilson, Eric, MD;  Location: [facility] ORS;  Service: General;  Laterality: N/A;  . CORONARY ARTERY BYPASS GRAFT N/A 11/29/2017    LIMA-LAD,  SVG-PDA, SVG-CFX  Procedure: CORONARY ARTERY BYPASS GRAFTING (CABG) times three using the left saphaneous vien. harvested endoscopicly and left internal mammary artery.;  Surgeon: Kerin PernaVan Trigt, Peter, MD;  [facility];  Service: Open Heart Surgery;  Laterality: N/A;  . EYE SURGERY     left eye surgery 04/2016  . IR CHOLANGIOGRAM EXISTING TUBE  10/17/2018  . IR CHOLANGIOGRAM EXISTING TUBE  10/30/2018  . IR PERC CHOLECYSTOSTOMY  09/05/2018  . KNEE SURGERY Left   . LOOP RECORDER INSERTION N/A 06/28/2018   Procedure: LOOP RECORDER INSERTION;  Surgeon: Duke SalviaKlein, Steven C, MD;  [facility];  Service: Cardiovascular;  Laterality: N/A;  . RIGHT/LEFT HEART CATH AND CORONARY ANGIOGRAPHY N/A 10/19/2017   Procedure: RIGHT/LEFT HEART CATH AND CORONARY ANGIOGRAPHY;  Surgeon: SwazilandJordan, [name], MD;  [facility];  Service: Cardiovascular;  Laterality: N/A;  . SHOULDER SURGERY Right   . TEE WITHOUT CARDIOVERSION N/A 11/29/2017   Procedure: TRANSESOPHAGEAL ECHOCARDIOGRAM (TEE);  Surgeon: Donata ClayVan Trigt, Theron AristaPeter, MD;  [facility];  Service: Open Heart Surgery;  Laterality: N/A;  . TEE WITHOUT CARDIOVERSION N/A 06/28/2018   Procedure: TRANSESOPHAGEAL ECHOCARDIOGRAM (TEE);  Surgeon: Jake BatheSkains, Mark C, MD;  [facility];  Service: Cardiovascular;  Laterality: N/A;  . TOE AMPUTATION Left 11/28/15   4th toe   Social History   Occupational History  . Occupation: Disabled  Tobacco Use  . Smoking status: Never Smoker  . Smokeless tobacco: Never Used  Substance and Sexual Activity  . Alcohol use: No  . Drug use: No  . Sexual activity: Yes

## 2019-08-01 ENCOUNTER — Ambulatory Visit: Payer: Medicare Other | Admitting: Family

## 2019-08-03 NOTE — Telephone Encounter (Signed)
m °

## 2019-08-07 ENCOUNTER — Ambulatory Visit: Payer: Medicare Other | Admitting: Orthopedic Surgery

## 2019-08-07 NOTE — Progress Notes (Signed)
[facility] Summary Report / Loop Recorder 

## 2019-08-21 ENCOUNTER — Ambulatory Visit: Payer: Medicare Other | Admitting: Orthopedic Surgery

## 2019-08-31 ENCOUNTER — Ambulatory Visit (INDEPENDENT_AMBULATORY_CARE_PROVIDER_SITE_OTHER): Payer: Medicare Other | Admitting: *Deleted

## 2019-08-31 DIAGNOSIS — I639 Cerebral infarction, unspecified: Secondary | ICD-10-CM

## 2019-09-01 LAB — CUP PACEART REMOTE DEVICE CHECK
Date Time Interrogation Session: 20200918204021
Implantable Pulse Generator Implant Date: 20190717

## 2019-09-03 ENCOUNTER — Ambulatory Visit (INDEPENDENT_AMBULATORY_CARE_PROVIDER_SITE_OTHER): Payer: Medicare Other | Admitting: Orthopedic Surgery

## 2019-09-03 ENCOUNTER — Encounter: Payer: Self-pay | Admitting: Orthopedic Surgery

## 2019-09-03 VITALS — Ht 74.0 in | Wt 215.0 lb

## 2019-09-03 DIAGNOSIS — Z89512 Acquired absence of left leg below knee: Secondary | ICD-10-CM | POA: Diagnosis not present

## 2019-09-03 DIAGNOSIS — S88112A Complete traumatic amputation at level between knee and ankle, left lower leg, initial encounter: Secondary | ICD-10-CM

## 2019-09-03 NOTE — Progress Notes (Signed)
[facility] Summary Report / Loop Recorder 

## 2019-09-03 NOTE — Progress Notes (Signed)
Office Visit Note   Patient: Travis [name]           Date of Birth: [birth year]           MRN: [id number] Visit Date: 09/03/2019              Requested by: Nuala Alpha, DO [address],  [place] [address] PCP: Nuala Alpha, DO  Chief Complaint  Patient presents with   Left Knee - Follow-up    04/18/2019 Left BKA      HPI: Patient is a 48 year old gentleman who presents 4 months status post left transtibial amputation.  Patient states that his K3 level foot and ankle was denied by insurance.  Patient is currently working with [employer].  Assessment & Plan: Visit Diagnoses:  1. Below-knee amputation of left lower extremity ([place])     Plan: Patient is sent in a prescription to work with Shirlean Mylar for physical therapy.  He will need a K3 foot and ankle for his activities.  Patient works with a varying cadence and has to walk on uneven surfaces has to walk up and down bleachers and has a high demand job level that requires the K3 level prosthesis.  Patient has the desire ability to use the K3 level foot and ankle.  Follow-Up Instructions: Return if symptoms worsen or fail to improve.   Ortho Exam  Patient is alert, oriented, no adenopathy, well-dressed, normal affect, normal respiratory effort. Examination patient has a well consolidated residual limb he is currently using a walker he has full extension of his knee there are no open ulcers.  Imaging: No results found. No images are attached to the encounter.  Labs: Lab Results  Component Value Date   HGBA1C 6.9 (H) 12/28/2018   HGBA1C 8.0 (A) 10/23/2018   HGBA1C 6.9 (H) 06/26/2018   ESRSEDRATE 1 11/09/2016   ESRSEDRATE 24 (H) 10/06/2016   CRP 0.6 11/09/2016   CRP 6.3 (H) 10/06/2016   REPTSTATUS 11/03/2018 FINAL 10/29/2018   REPTSTATUS 11/03/2018 FINAL 10/29/2018   CULT  10/29/2018    NO GROWTH 5 DAYS Performed at [facility], [place] [address], [place], [place] [address]    CULT  10/29/2018    NO  GROWTH 5 DAYS Performed at [facility] [address] [place], [place] [address]      Lab Results  Component Value Date   ALBUMIN 3.5 04/18/2019   ALBUMIN 4.2 12/28/2018   ALBUMIN 4.4 11/21/2018   PREALBUMIN 20.2 11/11/2015    Lab Results  Component Value Date   MG 2.0 11/01/2018   MG 2.1 11/30/2017   MG 2.3 11/30/2017   Lab Results  Component Value Date   VD25OH 17.8 (L) 11/11/2015    Lab Results  Component Value Date   PREALBUMIN 20.2 11/11/2015   CBC EXTENDED Latest Ref Rng & Units 04/18/2019 03/09/2019 12/28/2018  WBC 4.0 - 10.5 K/uL 10.1 9.7 7.8  RBC 4.22 - 5.81 MIL/uL 4.62 4.61 4.66  HGB 13.0 - 17.0 g/dL 13.4 13.7 13.6  HCT 39.0 - 52.0 % 39.8 40.8 42.1  PLT 150 - 400 K/uL 361 275 296  NEUTROABS 1.7 - 7.7 K/uL - - 5.3  LYMPHSABS 0.7 - 4.0 K/uL - - 1.7     Body mass index is 27.6 kg/m.  Orders:  Orders Placed This Encounter  Procedures   Ambulatory referral to Physical Therapy   No orders of the defined types were placed in this encounter.  Procedures: No procedures performed  Clinical Data: No additional findings.  ROS:  All other systems negative, except as noted in the HPI. Review of Systems  Objective: Vital Signs: Ht 6\' 2"  (1.88 m)    Wt 215 lb (97.5 kg)    BMI 27.60 kg/m   Specialty Comments:  No specialty comments available.  PMFS History: Patient Active Problem List   Diagnosis Date Noted   Below-knee amputation of left lower extremity (HCC) 04/18/2019   Dehiscence of incision, sequela    H/O amputation of lesser toe, left (HCC) 04/04/2019   Foreign body (FB) in soft tissue    Pressure injury of left foot, stage 2 (HCC) 02/14/2019   PVD (peripheral vascular disease) (HCC) 02/14/2019   Snoring 02/14/2019   S/P laparoscopic cholecystectomy 01/04/2019   Intractable vomiting    Sepsis (HCC) 10/29/2018   Abnormal finding on imaging 10/29/2018   Cholecystostomy care (HCC) 09/05/2018   Essential hypertension  06/27/2018   Hyperlipidemia 06/27/2018   Diabetic retinopathy (HCC) 06/27/2018   Leukocytosis 06/27/2018   Ischemic stroke (HCC) - mult small B infarcts s/p tPA, unknown embolic source 06/25/2018   Left arm numbness 03/03/2018   Strain of right trapezius muscle 03/03/2018   Ischemic cardiomyopathy 12/16/2017   Major depressive disorder, recurrent episode, mild (HCC) 09/16/2017   Acute on chronic systolic and diastolic heart failure, NYHA class 3 (HCC) 09/14/2017   CAD in native artery 09/14/2017   S/P BKA (below knee amputation) unilateral, left (HCC) 11/19/2016   Short Achilles tendon (acquired), left ankle 11/19/2016   Subacute osteomyelitis, left ankle and foot (HCC) 10/20/2016   Gastroparesis due to DM (HCC)    History of diabetic ulcer of foot 11/14/2015   Vitamin D deficiency 11/12/2015   Osteoporosis 11/12/2015   GERD (gastroesophageal reflux disease) 11/03/2015   Erectile dysfunction 07/11/2009   Allergic rhinitis 12/11/2008   Diabetic peripheral neuropathy associated with type 2 diabetes mellitus (HCC) 08/30/2008   Insomnia 08/24/2007   Controlled type 2 diabetes mellitus with diabetic autonomic neuropathy, with long-term current use of insulin (HCC) 08/09/2007   Past Medical History:  Diagnosis Date   Acute on chronic systolic and diastolic heart failure, NYHA class 3 (HCC) 09/14/2017   Acute osteomyelitis of metatarsal bone of left foot (HCC) 11/12/2015   CAD in native artery 09/14/2017   Closed fracture of right tibial plateau 11/11/2015   Coronary artery disease    Dehiscence of amputation stump (HCC)    left leg   Depression with anxiety    Diabetes mellitus    INSULIN DEPENDENT Type II   Diabetic peripheral neuropathy associated with type 2 diabetes mellitus (HCC) 08/30/2008   Qualifier: Diagnosis of  By: Daphine Deutscher FNP, [place]kedtra     Diabetic ulcer of left foot (HCC) 11/14/2015   Gastroparesis    GERD (gastroesophageal reflux disease)     History of loop recorder 06/2018   Last remote device check was on 02/14/2019   Hypertension    patient denies   Left ventricular aneurysm    REPORTS HE IS NOT AWAREW OF THIS    Myocardial infarction (HCC)    Osteomyelitis (HCC)    Osteoporosis 11/12/2015   Peripheral neuropathy    feet and below bilateral knees   Peripheral vascular disease (HCC)    PAD in Left leg and some in right leg   Shortness of breath dyspnea    Stroke (HCC) 05/2018   Vitamin D deficiency 11/12/2015    Family History  Problem Relation Age of  Onset   Cancer Paternal Grandmother    COPD Mother    Heart failure Mother    Anxiety disorder Mother    Depression Mother    Fibromyalgia Mother    Esophageal cancer Father        Smoker, still does   Diabetes Sister    Fibromyalgia Sister    Diabetes Maternal Uncle    Diabetes Maternal Grandmother    GER disease Sister    Liver disease Paternal Grandfather    Hemachromatosis Paternal Grandfather    Diabetes Maternal Grandfather     Past Surgical History:  Procedure Laterality Date   AMPUTATION Left 11/28/2015   Procedure: Left 4th Ray Amputation;  Surgeon: Nadara MustardMarcus Cassadi Purdie V, MD;  [facility];  Service: Orthopedics;  Laterality: Left;   AMPUTATION Left 10/29/2016   Procedure: LEFT 5TH RAY AMPUTATION;  Surgeon: Nadara MustardMarcus V Antwione Picotte, MD;  [facility];  Service: Orthopedics;  Laterality: Left;   AMPUTATION Left 03/09/2019   Procedure: LEFT FOOT 5TH RAY AMPUTATION;  Surgeon: Nadara Mustarduda, Baldwin Racicot V, MD;  [facility];  Service: Orthopedics;  Laterality: Left;   AMPUTATION Left 04/18/2019   Procedure: LEFT BELOW KNEE AMPUTATION;  Surgeon: Nadara Mustarduda, Paislea Hatton V, MD;  [facility];  Service: Orthopedics;  Laterality: Left;   ANTERIOR VITRECTOMY Left 04/12/2016   Procedure: ANTERIOR CHAMBER WASH OUT WITH GAS FLUID EXCHANGE;  Surgeon: Carmela RimaNarendra Patel, MD;  [facility];  Service: Ophthalmology;  Laterality: Left;   CARDIAC CATHETERIZATION  10/2017    CATARACT EXTRACTION Left    CHOLECYSTECTOMY N/A 01/04/2019   Procedure: LAPAROSCOPIC CHOLECYSTECTOMY;  Surgeon: Gaynelle AduWilson, Eric, MD;  [facility]S;  Service: General;  Laterality: N/A;   CORONARY ARTERY BYPASS GRAFT N/A 11/29/2017    LIMA-LAD, SVG-PDA, SVG-CFX  Procedure: CORONARY ARTERY BYPASS GRAFTING (CABG) times three using the left saphaneous vien. harvested endoscopicly and left internal mammary artery.;  Surgeon: Kerin PernaVan Trigt, Peter, MD;  [facility];  Service: Open Heart Surgery;  Laterality: N/A;   EYE SURGERY     left eye surgery 04/2016   IR CHOLANGIOGRAM EXISTING TUBE  10/17/2018   IR CHOLANGIOGRAM EXISTING TUBE  10/30/2018   IR PERC CHOLECYSTOSTOMY  09/05/2018   KNEE SURGERY Left    LOOP RECORDER INSERTION N/A 06/28/2018   Procedure: LOOP RECORDER INSERTION;  Surgeon: Duke SalviaKlein, Steven C, MD;  [facility];  Service: Cardiovascular;  Laterality: N/A;   RIGHT/LEFT HEART CATH AND CORONARY ANGIOGRAPHY N/A 10/19/2017   Procedure: RIGHT/LEFT HEART CATH AND CORONARY ANGIOGRAPHY;  Surgeon: SwazilandJordan, [name], MD;  [facility];  Service: Cardiovascular;  Laterality: N/A;   SHOULDER SURGERY Right    TEE WITHOUT CARDIOVERSION N/A 11/29/2017   Procedure: TRANSESOPHAGEAL ECHOCARDIOGRAM (TEE);  Surgeon: Donata ClayVan Trigt, Theron AristaPeter, MD;  [facility];  Service: Open Heart Surgery;  Laterality: N/A;   TEE WITHOUT CARDIOVERSION N/A 06/28/2018   Procedure: TRANSESOPHAGEAL ECHOCARDIOGRAM (TEE);  Surgeon: Jake BatheSkains, Mark C, MD;  [facility];  Service: Cardiovascular;  Laterality: N/A;   TOE AMPUTATION Left 11/28/15   4th toe   Social History   Occupational History   Occupation: Disabled  Tobacco Use   Smoking status: Never Smoker   Smokeless tobacco: Never Used  Substance and Sexual Activity   Alcohol use: No   Drug use: No   Sexual activity: Yes

## 2019-09-17 ENCOUNTER — Encounter: Payer: Self-pay | Admitting: Orthopedic Surgery

## 2019-09-17 ENCOUNTER — Ambulatory Visit (INDEPENDENT_AMBULATORY_CARE_PROVIDER_SITE_OTHER): Payer: Medicare Other | Admitting: Orthopedic Surgery

## 2019-09-17 ENCOUNTER — Other Ambulatory Visit: Payer: Self-pay | Admitting: Family Medicine

## 2019-09-17 VITALS — Ht 74.0 in | Wt 215.0 lb

## 2019-09-17 DIAGNOSIS — Z89511 Acquired absence of right leg below knee: Secondary | ICD-10-CM | POA: Diagnosis not present

## 2019-09-17 DIAGNOSIS — S88112A Complete traumatic amputation at level between knee and ankle, left lower leg, initial encounter: Secondary | ICD-10-CM

## 2019-09-17 DIAGNOSIS — L97511 Non-pressure chronic ulcer of other part of right foot limited to breakdown of skin: Secondary | ICD-10-CM | POA: Diagnosis not present

## 2019-09-17 NOTE — Progress Notes (Signed)
Office Visit Note   Patient: Travis [name]           Date of Birth: Jun 11, 1971           MRN: [id number] Visit Date: 09/17/2019              Requested by: Arlyce Harman, DO [address] [place],  [place] [address] PCP: Arlyce Harman, DO  Chief Complaint  Patient presents with  . Left Leg - Follow-up    04/18/2019 left BKA  . Right Foot - Pain      HPI: Patient is a 48 year old gentleman who presents for an acute ulcer PIP joint right little toe.  Patient states that he was doing a lot of housework with his shoes on and noticed a blister this Friday that popped.  He has been using Neosporin and a Band-Aid.  Patient states that he is doing well with his prosthesis on the left.  Assessment & Plan: Visit Diagnoses:  1. Below-knee amputation of left lower extremity (HCC)   2. Toe ulcer, right, limited to breakdown of skin (HCC)     Plan: Patient will continue washing with soap and water apply Band-Aid to protect the ulcer.  Discussed the importance of getting out of his Nike sneakers they are too narrow and do not have another depth.  Recommend a new balance walking sneaker or other extra-depth shoe.  Follow-Up Instructions: Return in about 2 weeks (around 10/01/2019).   Ortho Exam  Patient is alert, oriented, no adenopathy, well-dressed, normal affect, normal respiratory effort. Examination of the right foot patient has grooves over his claw toes that are consistent with pressure from the top of his [employer].  He has pressure areas over all toes but he is developed a full-thickness ulcer over the little toe PIP joint.  This is 10 mm in diameter 0.1 mm deep there is no exposed bone or tendon.  There is no swelling or ascending cellulitis.  With walking patient has a slight amount of foot drop on the right.  Imaging: No results found. No images are attached to the encounter.  Labs: Lab Results  Component Value Date   HGBA1C 6.9 (H) 12/28/2018   HGBA1C 8.0 (A)  10/23/2018   HGBA1C 6.9 (H) 06/26/2018   ESRSEDRATE 1 11/09/2016   ESRSEDRATE 24 (H) 10/06/2016   CRP 0.6 11/09/2016   CRP 6.3 (H) 10/06/2016   REPTSTATUS 11/03/2018 FINAL 10/29/2018   REPTSTATUS 11/03/2018 FINAL 10/29/2018   CULT  10/29/2018    NO GROWTH 5 DAYS Performed at [facility], [address], [place], [place] [address]    CULT  10/29/2018    NO GROWTH 5 DAYS Performed at [facility], [address], [place], [place] [address]      Lab Results  Component Value Date   ALBUMIN 3.5 04/18/2019   ALBUMIN 4.2 12/28/2018   ALBUMIN 4.4 11/21/2018   PREALBUMIN 20.2 11/11/2015    Lab Results  Component Value Date   MG 2.0 11/01/2018   MG 2.1 11/30/2017   MG 2.3 11/30/2017   Lab Results  Component Value Date   VD25OH 17.8 (L) 11/11/2015    Lab Results  Component Value Date   PREALBUMIN 20.2 11/11/2015   CBC EXTENDED Latest Ref Rng & Units 04/18/2019 03/09/2019 12/28/2018  WBC 4.0 - 10.5 K/uL 10.1 9.7 7.8  RBC 4.22 - 5.81 MIL/uL 4.62 4.61 4.66  HGB 13.0 - 17.0 g/dL 51.7 61.6 07.3  HCT 71.0 -  52.0 % 39.8 40.8 42.1  PLT 150 - 400 K/uL 361 275 296  NEUTROABS 1.7 - 7.7 K/uL - - 5.3  LYMPHSABS 0.7 - 4.0 K/uL - - 1.7     Body mass index is 27.6 kg/m.  Orders:  No orders of the defined types were placed in this encounter.  No orders of the defined types were placed in this encounter.    Procedures: No procedures performed  Clinical Data: No additional findings.  ROS:  All other systems negative, except as noted in the HPI. Review of Systems  Objective: Vital Signs: Ht 6\' 2"  (1.88 m)   Wt 215 lb (97.5 kg)   BMI 27.60 kg/m   Specialty Comments:  No specialty comments available.  PMFS History: Patient Active Problem List   Diagnosis Date Noted  . Below-knee amputation of left lower extremity ([place]) 04/18/2019  . Dehiscence of incision, sequela   . H/O amputation of lesser toe, left ([place]) 04/04/2019  . Foreign body (FB) in soft  tissue   . Pressure injury of left foot, stage 2 ([place]) 02/14/2019  . PVD (peripheral vascular disease) ([place]) 02/14/2019  . Snoring 02/14/2019  . S/P laparoscopic cholecystectomy 01/04/2019  . Intractable vomiting   . Sepsis ([place]) 10/29/2018  . Abnormal finding on imaging 10/29/2018  . Cholecystostomy care ([place]) 09/05/2018  . Essential hypertension 06/27/2018  . Hyperlipidemia 06/27/2018  . Diabetic retinopathy ([place]) 06/27/2018  . Leukocytosis 06/27/2018  . Ischemic stroke ([place]) - mult small B infarcts s/p tPA, unknown embolic source 01/60/1093  . Left arm numbness 03/03/2018  . Strain of right trapezius muscle 03/03/2018  . Ischemic cardiomyopathy 12/16/2017  . Major depressive disorder, recurrent episode, mild ([place]) 09/16/2017  . Acute on chronic systolic and diastolic heart failure, NYHA class 3 ([place]) 09/14/2017  . CAD in native artery 09/14/2017  . S/P BKA (below knee amputation) unilateral, left ([place]) 11/19/2016  . Short Achilles tendon (acquired), left ankle 11/19/2016  . Subacute osteomyelitis, left ankle and foot ([place]) 10/20/2016  . Gastroparesis due to DM ([place])   . History of diabetic ulcer of foot 11/14/2015  . Vitamin D deficiency 11/12/2015  . Osteoporosis 11/12/2015  . GERD (gastroesophageal reflux disease) 11/03/2015  . Erectile dysfunction 07/11/2009  . Allergic rhinitis 12/11/2008  . Diabetic peripheral neuropathy associated with type 2 diabetes mellitus ([place]) 08/30/2008  . Insomnia 08/24/2007  . Controlled type 2 diabetes mellitus with diabetic autonomic neuropathy, with long-term current use of insulin ([place]) 08/09/2007   Past Medical History:  Diagnosis Date  . Acute on chronic systolic and diastolic heart failure, NYHA class 3 ([place]) 09/14/2017  . Acute osteomyelitis of metatarsal bone of left foot ([place]) 11/12/2015  . CAD in native artery 09/14/2017  . Closed fracture of right tibial plateau 11/11/2015  . Coronary artery disease   . Dehiscence of amputation  stump (HCC)    left leg  . Depression with anxiety   . Diabetes mellitus    INSULIN DEPENDENT Type II  . Diabetic peripheral neuropathy associated with type 2 diabetes mellitus ([place]) 08/30/2008   Qualifier: Diagnosis of  By: [name] Done FNP, Tori Milks    . Diabetic ulcer of left foot ([place]) 11/14/2015  . Gastroparesis   . GERD (gastroesophageal reflux disease)   . History of loop recorder 06/2018   Last remote device check was on 02/14/2019  . Hypertension    patient denies  . Left ventricular aneurysm    REPORTS HE IS NOT AWAREW OF THIS   . Myocardial infarction (  HCC)   . Osteomyelitis (HCC)   . Osteoporosis 11/12/2015  . Peripheral neuropathy    feet and below bilateral knees  . Peripheral vascular disease (HCC)    PAD in Left leg and some in right leg  . Shortness of breath dyspnea   . Stroke (HCC) 05/2018  . Vitamin D deficiency 11/12/2015    Family History  Problem Relation Age of Onset  . Cancer Paternal Grandmother   . COPD Mother   . Heart failure Mother   . Anxiety disorder Mother   . Depression Mother   . Fibromyalgia Mother   . Esophageal cancer Father        Smoker, still does  . Diabetes Sister   . Fibromyalgia Sister   . Diabetes Maternal Uncle   . Diabetes Maternal Grandmother   . GER disease Sister   . Liver disease Paternal Grandfather   . Hemachromatosis Paternal Grandfather   . Diabetes Maternal Grandfather     Past Surgical History:  Procedure Laterality Date  . AMPUTATION Left 11/28/2015   Procedure: Left 4th Ray Amputation;  Surgeon: Nadara MustardMarcus Duda V, MD;  [facility];  Service: Orthopedics;  Laterality: Left;  . AMPUTATION Left 10/29/2016   Procedure: LEFT 5TH RAY AMPUTATION;  Surgeon: Nadara MustardMarcus V Duda, MD;  Location: [facility] OR;  Service: Orthopedics;  Laterality: Left;  . AMPUTATION Left 03/09/2019   Procedure: LEFT FOOT 5TH RAY AMPUTATION;  Surgeon: Nadara Mustarduda, Marcus V, MD;  [facility];  Service: Orthopedics;  Laterality: Left;  . AMPUTATION Left 04/18/2019    Procedure: LEFT BELOW KNEE AMPUTATION;  Surgeon: Nadara Mustarduda, Marcus V, MD;  [facility];  Service: Orthopedics;  Laterality: Left;  . ANTERIOR VITRECTOMY Left 04/12/2016   Procedure: ANTERIOR CHAMBER WASH OUT WITH GAS FLUID EXCHANGE;  Surgeon: Carmela RimaNarendra Patel, MD;  [facility];  Service: Ophthalmology;  Laterality: Left;  . CARDIAC CATHETERIZATION  10/2017  . CATARACT EXTRACTION Left   . CHOLECYSTECTOMY N/A 01/04/2019   Procedure: LAPAROSCOPIC CHOLECYSTECTOMY;  Surgeon: Gaynelle AduWilson, Eric, MD;  Location: [facility] ORS;  Service: General;  Laterality: N/A;  . CORONARY ARTERY BYPASS GRAFT N/A 11/29/2017    LIMA-LAD, SVG-PDA, SVG-CFX  Procedure: CORONARY ARTERY BYPASS GRAFTING (CABG) times three using the left saphaneous vien. harvested endoscopicly and left internal mammary artery.;  Surgeon: Kerin PernaVan Trigt, Peter, MD;  [facility];  Service: Open Heart Surgery;  Laterality: N/A;  . EYE SURGERY     left eye surgery 04/2016  . IR CHOLANGIOGRAM EXISTING TUBE  10/17/2018  . IR CHOLANGIOGRAM EXISTING TUBE  10/30/2018  . IR PERC CHOLECYSTOSTOMY  09/05/2018  . KNEE SURGERY Left   . LOOP RECORDER INSERTION N/A 06/28/2018   Procedure: LOOP RECORDER INSERTION;  Surgeon: Duke SalviaKlein, Steven C, MD;  [facility];  Service: Cardiovascular;  Laterality: N/A;  . RIGHT/LEFT HEART CATH AND CORONARY ANGIOGRAPHY N/A 10/19/2017   Procedure: RIGHT/LEFT HEART CATH AND CORONARY ANGIOGRAPHY;  Surgeon: SwazilandJordan, [name], MD;  [facility];  Service: Cardiovascular;  Laterality: N/A;  . SHOULDER SURGERY Right   . TEE WITHOUT CARDIOVERSION N/A 11/29/2017   Procedure: TRANSESOPHAGEAL ECHOCARDIOGRAM (TEE);  Surgeon: Donata ClayVan Trigt, Theron AristaPeter, MD;  [facility];  Service: Open Heart Surgery;  Laterality: N/A;  . TEE WITHOUT CARDIOVERSION N/A 06/28/2018   Procedure: TRANSESOPHAGEAL ECHOCARDIOGRAM (TEE);  Surgeon: Jake BatheSkains, Mark C, MD;  [facility];  Service: Cardiovascular;  Laterality: N/A;  . TOE AMPUTATION Left  11/28/15   4th toe   Social  History   Occupational History  . Occupation: Disabled  Tobacco Use  . Smoking status: Never Smoker  . Smokeless tobacco: Never Used  Substance and Sexual Activity  . Alcohol use: No  . Drug use: No  . Sexual activity: Yes

## 2019-09-26 ENCOUNTER — Other Ambulatory Visit: Payer: Self-pay | Admitting: Cardiovascular Disease

## 2019-10-01 ENCOUNTER — Ambulatory Visit: Payer: Medicare Other | Admitting: Orthopedic Surgery

## 2019-10-03 ENCOUNTER — Ambulatory Visit (INDEPENDENT_AMBULATORY_CARE_PROVIDER_SITE_OTHER): Payer: Medicare Other | Admitting: *Deleted

## 2019-10-03 DIAGNOSIS — I255 Ischemic cardiomyopathy: Secondary | ICD-10-CM

## 2019-10-04 LAB — CUP PACEART REMOTE DEVICE CHECK
Date Time Interrogation Session: 20201021203838
Implantable Pulse Generator Implant Date: 20190717

## 2019-10-15 ENCOUNTER — Ambulatory Visit: Payer: Medicare Other | Admitting: Orthopedic Surgery

## 2019-10-16 NOTE — Progress Notes (Signed)
[facility] Summary Report / Loop Recorder 

## 2019-10-17 ENCOUNTER — Other Ambulatory Visit: Payer: Self-pay

## 2019-10-17 MED ORDER — ROSUVASTATIN CALCIUM 40 MG PO TABS: ORAL_TABLET | ORAL | 1 refills | Status: DC

## 2019-10-28 ENCOUNTER — Other Ambulatory Visit: Payer: Self-pay | Admitting: Cardiovascular Disease

## 2019-11-05 ENCOUNTER — Ambulatory Visit (INDEPENDENT_AMBULATORY_CARE_PROVIDER_SITE_OTHER): Payer: Medicare Other | Admitting: *Deleted

## 2019-11-05 DIAGNOSIS — I639 Cerebral infarction, unspecified: Secondary | ICD-10-CM

## 2019-11-06 LAB — CUP PACEART REMOTE DEVICE CHECK
Date Time Interrogation Session: 20201124071639
Implantable Pulse Generator Implant Date: 20190717

## 2019-11-20 ENCOUNTER — Encounter: Payer: Medicare Other | Admitting: Physical Therapy

## 2019-11-27 ENCOUNTER — Ambulatory Visit (INDEPENDENT_AMBULATORY_CARE_PROVIDER_SITE_OTHER): Payer: Medicare Other | Admitting: Physical Therapy

## 2019-11-27 ENCOUNTER — Other Ambulatory Visit: Payer: Self-pay

## 2019-11-27 ENCOUNTER — Encounter: Payer: Self-pay | Admitting: Physical Therapy

## 2019-11-27 DIAGNOSIS — R293 Abnormal posture: Secondary | ICD-10-CM

## 2019-11-27 DIAGNOSIS — R2681 Unsteadiness on feet: Secondary | ICD-10-CM | POA: Diagnosis not present

## 2019-11-27 DIAGNOSIS — R2689 Other abnormalities of gait and mobility: Secondary | ICD-10-CM | POA: Diagnosis not present

## 2019-11-27 DIAGNOSIS — M25662 Stiffness of left knee, not elsewhere classified: Secondary | ICD-10-CM | POA: Diagnosis not present

## 2019-11-27 NOTE — Therapy (Signed)
[facility] [address] [place], [place], [address] Phone: [phone]   Fax:  [phone]  Physical Therapy Treatment  Patient Details  Name: Travis [name] MRN: [id number] Date of Birth: 1971/09/02 Referring Provider (PT): Aldean Baker, MD   Encounter Date: 11/27/2019  PT End of Session - 11/27/19 1526    Visit Number  1    Number of Visits  13    Date for PT Re-Evaluation  02/28/20    Authorization Type  [employer] Medicare    PT Start Time  1403    PT Stop Time  1445    PT Time Calculation (min)  42 min    Equipment Utilized During Treatment  Gait belt    Activity Tolerance  Patient tolerated treatment well    Behavior During Therapy  [facility] for tasks assessed/performed       Past Medical History:  Diagnosis Date  . Acute on chronic systolic and diastolic heart failure, NYHA class 3 (HCC) 09/14/2017  . Acute osteomyelitis of metatarsal bone of left foot (HCC) 11/12/2015  . CAD in native artery 09/14/2017  . Closed fracture of right tibial plateau 11/11/2015  . Coronary artery disease   . Dehiscence of amputation stump (HCC)    left leg  . Depression with anxiety   . Diabetes mellitus    INSULIN DEPENDENT Type II  . Diabetic peripheral neuropathy associated with type 2 diabetes mellitus (HCC) 08/30/2008   Qualifier: Diagnosis of  By: Daphine Deutscher FNP, Zena Amos    . Diabetic ulcer of left foot (HCC) 11/14/2015  . Gastroparesis   . GERD (gastroesophageal reflux disease)   . History of loop recorder 06/2018   Last remote device check was on 02/14/2019  . Hypertension    patient denies  . Left ventricular aneurysm    REPORTS HE IS NOT AWAREW OF THIS   . Myocardial infarction (HCC)   . Osteomyelitis (HCC)   . Osteoporosis 11/12/2015  . Peripheral neuropathy    feet and below bilateral knees  . Peripheral vascular disease (HCC)    PAD in Left leg and some in right leg  . Shortness of breath dyspnea   . Stroke (HCC) 05/2018  . Vitamin D  deficiency 11/12/2015    Past Surgical History:  Procedure Laterality Date  . AMPUTATION Left 11/28/2015   Procedure: Left 4th Ray Amputation;  Surgeon: Nadara Mustard, MD;  Location: [facility] OR;  Service: Orthopedics;  Laterality: Left;  . AMPUTATION Left 10/29/2016   Procedure: LEFT 5TH RAY AMPUTATION;  Surgeon: Nadara Mustard, MD;  Location: [facility] OR;  Service: Orthopedics;  Laterality: Left;  . AMPUTATION Left 03/09/2019   Procedure: LEFT FOOT 5TH RAY AMPUTATION;  Surgeon: Nadara Mustard, MD;  Location: [facility] OR;  Service: Orthopedics;  Laterality: Left;  . AMPUTATION Left 04/18/2019   Procedure: LEFT BELOW KNEE AMPUTATION;  Surgeon: Nadara Mustard, MD;  Location: [facility] OR;  Service: Orthopedics;  Laterality: Left;  . ANTERIOR VITRECTOMY Left 04/12/2016   Procedure: ANTERIOR CHAMBER WASH OUT WITH GAS FLUID EXCHANGE;  Surgeon: Carmela Rima, MD;  Location: [facility] OR;  Service: Ophthalmology;  Laterality: Left;  . CARDIAC CATHETERIZATION  10/2017  . CATARACT EXTRACTION Left   . CHOLECYSTECTOMY N/A 01/04/2019   Procedure: LAPAROSCOPIC CHOLECYSTECTOMY;  Surgeon: Gaynelle Adu, MD;  Location: [facility] ORS;  Service: General;  Laterality: N/A;  . CORONARY ARTERY BYPASS GRAFT N/A 11/29/2017    LIMA-LAD, SVG-PDA, SVG-CFX  Procedure: CORONARY ARTERY BYPASS GRAFTING (CABG) times three using the  left saphaneous vien. harvested endoscopicly and left internal mammary artery.;  Surgeon: Kerin PernaVan Trigt, Peter, MD;  [facility];  Service: Open Heart Surgery;  Laterality: N/A;  . EYE SURGERY     left eye surgery 04/2016  . IR CHOLANGIOGRAM EXISTING TUBE  10/17/2018  . IR CHOLANGIOGRAM EXISTING TUBE  10/30/2018  . IR PERC CHOLECYSTOSTOMY  09/05/2018  . KNEE SURGERY Left   . LOOP RECORDER INSERTION N/A 06/28/2018   Procedure: LOOP RECORDER INSERTION;  Surgeon: Duke SalviaKlein, Steven C, MD;  [facility];  Service: Cardiovascular;  Laterality: N/A;  . RIGHT/LEFT HEART CATH AND CORONARY ANGIOGRAPHY N/A 10/19/2017   Procedure:  RIGHT/LEFT HEART CATH AND CORONARY ANGIOGRAPHY;  Surgeon: SwazilandJordan, [name], MD;  [facility];  Service: Cardiovascular;  Laterality: N/A;  . SHOULDER SURGERY Right   . TEE WITHOUT CARDIOVERSION N/A 11/29/2017   Procedure: TRANSESOPHAGEAL ECHOCARDIOGRAM (TEE);  Surgeon: Donata ClayVan Trigt, Theron AristaPeter, MD;  [facility];  Service: Open Heart Surgery;  Laterality: N/A;  . TEE WITHOUT CARDIOVERSION N/A 06/28/2018   Procedure: TRANSESOPHAGEAL ECHOCARDIOGRAM (TEE);  Surgeon: Jake BatheSkains, Mark C, MD;  [facility];  Service: Cardiovascular;  Laterality: N/A;  . TOE AMPUTATION Left 11/28/15   4th toe    There were no vitals filed for this visit.  Subjective Assessment - 11/27/19 1409    Subjective  This 48yo male was referred by Aldean BakerMarcus Duda, MD on 09/03/2019 with Left Below-knee Amputation. He underwent left Transtibial Amputation on 04/18/2019 due osteomyelitis & dehiscence of partial foot amputation 03/09/2019. He developed blister on right little toe noted 09/17/2019 MD visit. Patient states walking better and improvement in balance since amputation. His prosthetist is Thayer OhmChris from [facility]. [facility] pt received initial prosthesis in June 2020, had a socket revision on 08/21/2019, and was trying to get a multi-axial foot replacement that was denied by insurance at the time. He states wearing the prosthesis daily for most wake hours. Pt ambulated without AD in community but reports taking SPC outside when home for security in case he feels unsteady on the unever terrain, however does not utilize it often. He reports that he had a dec in activity d/t 10+ year hx of neuropathy, however, since the amputation and receiving the prosthesis his walking/activity level has improved and he has better balance.    Pertinent History  L TTA, CAD, MI, CAGB 11/29/17, ischemic cardiomyopathy, chronic CHF, right tibial plateau fx 2016, DM2, peripheral neuropathy, HTN, osteoporosis, PVD, stroke,    Limitations   Walking    Patient Stated Goals  to get outside, be able to do yard work and [employer]ow lawn, walking outside home (unever terrain in yard), be more active, carry heavy items    Currently in Pain?  No/denies         [facility] PT Assessment - 11/27/19 1409      Assessment   Medical Diagnosis  Left Transtibial Amputation    Referring Provider (PT)  Aldean BakerMarcus Duda, MD    Onset Date/Surgical Date  09/03/19   MD referral     Precautions   Precautions  Fall      Restrictions   Weight Bearing Restrictions  No      Balance Screen   Has the patient fallen in the past 6 months  No    Has the patient had a decrease in activity level because of a fear of falling?   No    Is the patient reluctant to leave their home  because of a fear of falling?   No      Home Environment   Living Environment  Private residence    Living Arrangements  Children   22 & 40 yo son and daughter, 2 small dogs, 2 pit bulls   Available Help at Discharge  Family    Type of Home  House    Home Access  Ramped entrance    Home Layout  One level    Home Equipment  [place] - single point;Wheelchair - manual;Bedside commode;Walker - 2 wheels    Additional Comments  Unable to get into shower with built in tub d/t step needed to get into tub      Prior Function   Level of Independence  Independent    Vocation  On disability    Leisure  outdoor activities, walking outside       [employer]   Overall Cognitive Status  Within Functional Limits for tasks assessed      Observation/Other Assessments   Observations  atrophy of RLE, atrophy of L hypothenar eminence & thenar eminence, noted flexion contractures of L hand    Skin Integrity  RLE: dec hair growth below knee, signs of neuropathy & impaired circulation      ROM / Strength   AROM / PROM / Strength  PROM;Strength;AROM      AROM   Overall AROM   Deficits    Overall AROM Comments  Able to achieve R terminal knee extension, lacks full range DF on R actively       PROM    Overall PROM   Deficits    Overall PROM Comments  --    Right Ankle Dorsiflexion  -10   -10* with knee extended, 12* with knee flexed     Strength   Overall Strength Comments  --    Right Knee Extension  5/5    Right Ankle Dorsiflexion  3-/5      Transfers   Transfers  Sit to Stand;Stand to Sit    Sit to Stand  5: Supervision;With upper extremity assist;With armrests;From chair/3-in-1;Other (comment)   requires external support to stabilize upon arising   Stand to Sit  5: Supervision;With upper extremity assist;With armrests;To chair/3-in-1;Other (comment)   uses back of legs against chair to control descent     Ambulation/Gait   Ambulation/Gait  Yes    Ambulation/Gait Assistance  5: Supervision    Ambulation Distance (Feet)  100 Feet    Assistive device  Prosthesis;None   TTA prosthesis   Gait Pattern  Step-through pattern;Decreased step length - right;Decreased stance time - left;Decreased stride length;Decreased hip/knee flexion - right;Decreased hip/knee flexion - left;Decreased dorsiflexion - right;Decreased weight shift to left;Narrow base of support;Decreased arm swing - left;Right hip hike;Antalgic;Lateral hip instability;Lateral trunk lean to left   L hip drop in stance indicating need for socks   Ambulation Surface  Level;Indoor    Gait velocity  3.08 ft/sec    Stairs  Yes    Stairs Assistance  4: Min assist    Stairs Assistance Details (indicate cue type and reason)  PT provided minA for safety especially when pt descending steps.     Stair Management Technique  One rail Right;Alternating pattern    Number of Stairs  3    Height of Stairs  6      Balance   Balance Assessed  Yes      Dynamic Standing Balance   Dynamic Standing - Balance Support  During functional activity  Dynamic Standing - Level of Assistance  4: Min assist;5: Stand by assistance    Dynamic Standing - Balance Activities  Other/ Comments    Dynamic Standing - Comments  reaching to knee level to  adjust prosthetic sleeve: minA without RLE posteriorly touching chair for stabilizatoin and SBA with RLE touching chair.  Required intermittent UE touch on external support - only able to perform dynamic activity for up to 15 seconds      Functional Gait  Assessment   Gait assessed   Yes    Gait Level Surface  Walks 20 ft in less than 7 sec but greater than 5.5 sec, uses assistive device, slower speed, mild gait deviations, or deviates 6-10 in outside of the 12 in walkway width.    Change in Gait Speed  Able to change speed, demonstrates mild gait deviations, deviates 6-10 in outside of the 12 in walkway width, or no gait deviations, unable to achieve a major change in velocity, or uses a change in velocity, or uses an assistive device.    Gait with Horizontal Head Turns  Performs head turns smoothly with slight change in gait velocity (eg, minor disruption to smooth gait path), deviates 6-10 in outside 12 in walkway width, or uses an assistive device.    Gait with Vertical Head Turns  Performs task with slight change in gait velocity (eg, minor disruption to smooth gait path), deviates 6 - 10 in outside 12 in walkway width or uses assistive device    Gait and Pivot Turn  Pivot turns safely in greater than 3 sec and stops with no loss of balance, or pivot turns safely within 3 sec and stops with mild imbalance, requires small steps to catch balance.    Step Over Obstacle  Is able to step over one shoe box (4.5 in total height) but must slow down and adjust steps to clear box safely. May require verbal cueing.    Gait with Narrow Base of Support  Ambulates less than 4 steps heel to toe or cannot perform without assistance.    Gait with Eyes Closed  Walks 20 ft, slow speed, abnormal gait pattern, evidence for imbalance, deviates 10-15 in outside 12 in walkway width. Requires more than 9 sec to ambulate 20 ft.    Ambulating Backwards  Cannot walk 20 ft without assistance, severe gait deviations or  imbalance, deviates greater than 15 in outside 12 in walkway width or will not attempt task.   minA required for safety/stability   Steps  Cannot do safely.   alternating but minA for safety   Total Score  12    FGA comment:  12/30 indicates fall risk      Prosthetics Assessment - 11/27/19 1424      Prosthetics   Prosthetic Care Independent with  --    Prosthetic Care Dependent with  Skin check;Residual limb care;Prosthetic cleaning;Ply sock cleaning;Correct ply sock adjustment;Proper wear schedule/adjustment;Proper weight-bearing schedule/adjustment;Care of non-amputated limb    Prosthetic Care Comments   PT educated pt on need of sock ply since residual limb has shrunk since receiving prosthesis. PT discussed importance of changing and cleaning liner daily to prevent breakdown of the integrity of the product or risk limb infection. Pt educated pt on weight shifting while in standing prior to fully donning liner to get a better fit and dec potential for pistoning.     Donning prosthesis   Supervision    Doffing prosthesis   Modified independent (Device/Increase time)  Current prosthetic wear tolerance (days/week)   most wake hours    Current prosthetic wear tolerance (#hours/day)   daily    Current prosthetic weight-bearing tolerance (hours/day)   Patient tolerated 5 minutes of standing & gait activities without limb pain. He does report LE fatigue and intermittent claudication pain in RLE if does too much.     Edema  none    Residual limb condition   dec hair growth below knee, dec coloration distally & slightly colder, some redness distal thigh, dec scar mobility distal tibia, scar on tibial tuberosity that was from a fall > 6 months ago (not a concern)    Prosthesis Description  dynamic response foot, suction sleeve with gel liner interface, total contact socket design    K code/activity level with prosthetic use   K3 full community with variable cadence                           PT Education - 11/27/19 1522    Education Details  PT role; POC; SPT educated pt on importance of daily foot/skin/shoe check to prevent wounds d/t dec sensation from neuropathy; PT educated pt on fall risk if not wearing prosthesis d/t uneven weight distribution. PT demonstrated tendon gliding exercise for R hand.    Person(s) Educated  Patient    Methods  Explanation;Demonstration;Verbal cues    Comprehension  Verbalized understanding;Returned demonstration;Verbal cues required       PT Short Term Goals - 11/27/19 1529      PT SHORT TERM GOAL #1   Title  Patient will verbalize independence with proper prosthetic cleaning. (All STGs due 12/27/2019)    Time  4    Period  Weeks    Status  New    Target Date  12/27/19      PT SHORT TERM GOAL #2   Title  Patient able to ambulate with head turns to scan environment without balance issues.    Time  4    Period  Weeks    Status  New    Target Date  12/27/19      PT SHORT TERM GOAL #3   Title  Patient will negotiate stairs with alternating pattern, with UE support, with supervision only.    Time  4    Period  Weeks    Status  New    Target Date  12/27/19      PT SHORT TERM GOAL #4   Title  Patient ambulates 500' including uneven terrain with prosthesis only with supervision.    Time  4    Period  Weeks    Status  New    Target Date  12/27/19        PT Long Term Goals - 11/27/19 1536      PT LONG TERM GOAL #1   Title  Patient demonstrates and verbalizes independence with all aspects of prosthetic care to enable safe utilization of prosthesis. (All LTGs due 02/21/20)    Time  12    Period  Weeks    Status  New    Target Date  02/21/20      PT LONG TERM GOAL #2   Title  Patient improves FGA score >/= 20/30 for increased safety with gait for community ambulation.    Time  12    Period  Weeks    Status  New    Target Date  02/21/20      PT LONG  TERM GOAL #3   Title  Patient will  negotiate stairs with alternating pattern with single rail & step-to without rail modified independent to enable community access.    Time  12    Period  Weeks    Status  New    Target Date  02/21/20      PT LONG TERM GOAL #4   Title  Patient will ambulate >/= 1000' with prosthesis & cane or less negotiating obstacles, ramps, curbs and uneven terrain for increased functional independence for community ambulation    Time  12    Period  Weeks    Status  New    Target Date  02/21/20            Plan - 11/27/19 1543    Clinical Impression Statement  Patient is a 48 yo male presenting to OPPT for a left transtibial amputation from 04/18/2019. His PMH is significant for: L TTA, CAD, MI, CAGB 11/29/17, ischemic cardiomyopathy, chronic CHF, right tibial plateau fx 2016, DM2, peripheral neuropathy, HTN, osteoporosis, PVD, stroke. PT examination revealed deficits in DF ROM limitation d/t weakness and possible some muscle tightness, balance, and gait abnormality. FGA = 12/30 indicating fall risk. He demonstrated L hip drop in stance d/t residual limb shrinkage indicating a need to start using prosthetic socks when donning prosthesis for correct fit and to reduce potential for pistoning. Patient can benefit from skilled therapy services to address the above impairments and progress towards functional goals.    Personal Factors and Comorbidities  Comorbidity 3+;Fitness;Time since onset of injury/illness/exacerbation    Comorbidities  L TTA, CAD, MI, CAGB 11/29/17, ischemic cardiomyopathy, chronic CHF, right tibial plateau fx 2016, DM2, peripheral neuropathy, HTN, osteoporosis, PVD, stroke,    Examination-Activity Limitations  Stairs;Stand;Squat;Lift;Carry;Transfers    Examination-Participation Restrictions  Community Activity;Yard Work    Conservation officer, historic buildings  Evolving/Moderate complexity    Clinical Decision Making  Moderate    Rehab Potential  Good    PT Frequency  1x / week   d/t pt  preference because of co-pay   PT Duration  12 weeks    PT Treatment/Interventions  ADLs/Self Care Home Management;Gait training;Stair training;Functional mobility training;Therapeutic activities;Therapeutic exercise;Balance training;Neuromuscular re-education;Patient/family education;Prosthetic Training;DME Instruction;Vestibular    PT Next Visit Plan  HEP for balance, check use of socks/assess how many needed, stair/ramp/curb negotiation, higher level balance    Consulted and Agree with Plan of Care  Patient       Patient will benefit from skilled therapeutic intervention in order to improve the following deficits and impairments:  Abnormal gait, Decreased endurance, Decreased activity tolerance, Decreased balance, Decreased strength, Decreased range of motion, Impaired sensation, Decreased knowledge of use of DME, Decreased mobility, Impaired flexibility, Postural dysfunction, Prosthetic Dependency  Visit Diagnosis: Stiffness of left knee, not elsewhere classified  Unsteadiness on feet  Other abnormalities of gait and mobility  Abnormal posture     Problem List Patient Active Problem List   Diagnosis Date Noted  . Below-knee amputation of left lower extremity (HCC) 04/18/2019  . Dehiscence of incision, sequela   . H/O amputation of lesser toe, left (HCC) 04/04/2019  . Foreign body (FB) in soft tissue   . Pressure injury of left foot, stage 2 (HCC) 02/14/2019  . PVD (peripheral vascular disease) (HCC) 02/14/2019  . Snoring 02/14/2019  . S/P laparoscopic cholecystectomy 01/04/2019  . Intractable vomiting   . Sepsis (HCC) 10/29/2018  . Abnormal finding on imaging 10/29/2018  . Cholecystostomy care (HCC) 09/05/2018  .  Essential hypertension 06/27/2018  . Hyperlipidemia 06/27/2018  . Diabetic retinopathy ([place]) 06/27/2018  . Leukocytosis 06/27/2018  . Ischemic stroke ([place]) - mult small B infarcts s/p tPA, unknown embolic source 11/57/2620  . Left arm numbness 03/03/2018  .  Strain of right trapezius muscle 03/03/2018  . Ischemic cardiomyopathy 12/16/2017  . Major depressive disorder, recurrent episode, mild ([place]) 09/16/2017  . Acute on chronic systolic and diastolic heart failure, NYHA class 3 ([place]) 09/14/2017  . CAD in native artery 09/14/2017  . S/P BKA (below knee amputation) unilateral, left ([place]) 11/19/2016  . Short Achilles tendon (acquired), left ankle 11/19/2016  . Subacute osteomyelitis, left ankle and foot ([place]) 10/20/2016  . Gastroparesis due to DM ([place])   . History of diabetic ulcer of foot 11/14/2015  . Vitamin D deficiency 11/12/2015  . Osteoporosis 11/12/2015  . GERD (gastroesophageal reflux disease) 11/03/2015  . Erectile dysfunction 07/11/2009  . Allergic rhinitis 12/11/2008  . Diabetic peripheral neuropathy associated with type 2 diabetes mellitus ([place]) 08/30/2008  . Insomnia 08/24/2007  . Controlled type 2 diabetes mellitus with diabetic autonomic neuropathy, with long-term current use of insulin ([place]) 08/09/2007    Juliann Pulse SPT 11/27/2019, 3:51 PM  During this evaluation / treatment session, the Physical Therapist/Physical Therapist Assistant was present and participating in the session. This entire session was performed under direct supervision and direction of a licensed therapist/therapist assistant . I have personally read, edited and approve of the note as written.  Jamey Reas PT, DPT 11/27/2019, 9:54 PM  [facility] [address] [place], [place], [address] Phone: [phone]   Fax:  [phone]  Name: Travis [name] MRN: [id number] Date of Birth: 1970/12/18

## 2019-12-02 NOTE — Addendum Note (Signed)
Addended by: Patsey Berthold on: 12/02/2019 08:03 PM   Modules accepted: Level of Service

## 2019-12-04 ENCOUNTER — Other Ambulatory Visit: Payer: Self-pay

## 2019-12-04 MED ORDER — INSULIN PEN NEEDLE 31G X 5 MM MISC: 12 refills | Status: AC

## 2019-12-06 ENCOUNTER — Ambulatory Visit (INDEPENDENT_AMBULATORY_CARE_PROVIDER_SITE_OTHER): Payer: Medicare Other | Admitting: *Deleted

## 2019-12-06 DIAGNOSIS — I639 Cerebral infarction, unspecified: Secondary | ICD-10-CM | POA: Diagnosis not present

## 2019-12-06 LAB — CUP PACEART REMOTE DEVICE CHECK
Date Time Interrogation Session: 20201223230822
Implantable Pulse Generator Implant Date: 20190717

## 2019-12-09 NOTE — Progress Notes (Signed)
ILR remote 

## 2019-12-18 ENCOUNTER — Encounter: Payer: Medicare Other | Admitting: Physical Therapy

## 2019-12-25 ENCOUNTER — Encounter: Payer: Medicare Other | Admitting: Physical Therapy

## 2020-01-01 ENCOUNTER — Encounter: Payer: Medicare Other | Admitting: Physical Therapy

## 2020-01-07 ENCOUNTER — Ambulatory Visit (INDEPENDENT_AMBULATORY_CARE_PROVIDER_SITE_OTHER): Payer: Medicare Other | Admitting: *Deleted

## 2020-01-07 DIAGNOSIS — I639 Cerebral infarction, unspecified: Secondary | ICD-10-CM | POA: Diagnosis not present

## 2020-01-07 LAB — CUP PACEART REMOTE DEVICE CHECK
Date Time Interrogation Session: 20210124233407
Implantable Pulse Generator Implant Date: 20190717

## 2020-01-08 ENCOUNTER — Encounter: Payer: Medicare Other | Admitting: Physical Therapy

## 2020-01-10 ENCOUNTER — Encounter: Payer: Medicare Other | Admitting: Physical Therapy

## 2020-01-15 ENCOUNTER — Encounter: Payer: Medicare Other | Admitting: Physical Therapy

## 2020-01-22 ENCOUNTER — Encounter: Payer: Medicare Other | Admitting: Physical Therapy

## 2020-01-28 ENCOUNTER — Encounter: Payer: Medicare Other | Admitting: Physical Therapy

## 2020-01-29 ENCOUNTER — Encounter: Payer: Medicare Other | Admitting: Physical Therapy

## 2020-01-30 ENCOUNTER — Encounter: Payer: Self-pay | Admitting: Physical Therapy

## 2020-01-30 ENCOUNTER — Other Ambulatory Visit: Payer: Self-pay

## 2020-01-30 ENCOUNTER — Ambulatory Visit (INDEPENDENT_AMBULATORY_CARE_PROVIDER_SITE_OTHER): Payer: Medicare Other | Admitting: Physical Therapy

## 2020-01-30 DIAGNOSIS — R293 Abnormal posture: Secondary | ICD-10-CM

## 2020-01-30 DIAGNOSIS — R2681 Unsteadiness on feet: Secondary | ICD-10-CM

## 2020-01-30 DIAGNOSIS — M6281 Muscle weakness (generalized): Secondary | ICD-10-CM | POA: Diagnosis not present

## 2020-01-30 DIAGNOSIS — R2689 Other abnormalities of gait and mobility: Secondary | ICD-10-CM | POA: Diagnosis not present

## 2020-01-30 NOTE — Therapy (Signed)
[facility] [address] [place], [place], [address] Phone: [phone]   Fax:  [phone]  Physical Therapy Treatment  Patient Details  Name: Travis [name] MRN: [id number] Date of Birth: 1971-10-27 Referring Provider (PT): Aldean Baker, MD   Encounter Date: 01/30/2020  PT End of Session - 01/30/20 1151    Visit Number  2    Number of Visits  13    Date for PT Re-Evaluation  02/28/20    Authorization Type  [employer] Medicare    PT Start Time  1015    PT Stop Time  1100    PT Time Calculation (min)  45 min    Equipment Utilized During Treatment  Gait belt    Activity Tolerance  Patient tolerated treatment well    Behavior During Therapy  [facility] for tasks assessed/performed       Past Medical History:  Diagnosis Date  . Acute on chronic systolic and diastolic heart failure, NYHA class 3 (HCC) 09/14/2017  . Acute osteomyelitis of metatarsal bone of left foot (HCC) 11/12/2015  . CAD in native artery 09/14/2017  . Closed fracture of right tibial plateau 11/11/2015  . Coronary artery disease   . Dehiscence of amputation stump (HCC)    left leg  . Depression with anxiety   . Diabetes mellitus    INSULIN DEPENDENT Type II  . Diabetic peripheral neuropathy associated with type 2 diabetes mellitus (HCC) 08/30/2008   Qualifier: Diagnosis of  By: Daphine Deutscher FNP, Zena Amos    . Diabetic ulcer of left foot (HCC) 11/14/2015  . Gastroparesis   . GERD (gastroesophageal reflux disease)   . History of loop recorder 06/2018   Last remote device check was on 02/14/2019  . Hypertension    patient denies  . Left ventricular aneurysm    REPORTS HE IS NOT AWAREW OF THIS   . Myocardial infarction (HCC)   . Osteomyelitis (HCC)   . Osteoporosis 11/12/2015  . Peripheral neuropathy    feet and below bilateral knees  . Peripheral vascular disease (HCC)    PAD in Left leg and some in right leg  . Shortness of breath dyspnea   . Stroke (HCC) 05/2018  . Vitamin D  deficiency 11/12/2015    Past Surgical History:  Procedure Laterality Date  . AMPUTATION Left 11/28/2015   Procedure: Left 4th Ray Amputation;  Surgeon: Nadara Mustard, MD;  Location: [facility] OR;  Service: Orthopedics;  Laterality: Left;  . AMPUTATION Left 10/29/2016   Procedure: LEFT 5TH RAY AMPUTATION;  Surgeon: Nadara Mustard, MD;  Location: [facility] OR;  Service: Orthopedics;  Laterality: Left;  . AMPUTATION Left 03/09/2019   Procedure: LEFT FOOT 5TH RAY AMPUTATION;  Surgeon: Nadara Mustard, MD;  Location: [facility] OR;  Service: Orthopedics;  Laterality: Left;  . AMPUTATION Left 04/18/2019   Procedure: LEFT BELOW KNEE AMPUTATION;  Surgeon: Nadara Mustard, MD;  Location: [facility] OR;  Service: Orthopedics;  Laterality: Left;  . ANTERIOR VITRECTOMY Left 04/12/2016   Procedure: ANTERIOR CHAMBER WASH OUT WITH GAS FLUID EXCHANGE;  Surgeon: Carmela Rima, MD;  Location: [facility] OR;  Service: Ophthalmology;  Laterality: Left;  . CARDIAC CATHETERIZATION  10/2017  . CATARACT EXTRACTION Left   . CHOLECYSTECTOMY N/A 01/04/2019   Procedure: LAPAROSCOPIC CHOLECYSTECTOMY;  Surgeon: Gaynelle Adu, MD;  Location: [facility] ORS;  Service: General;  Laterality: N/A;  . CORONARY ARTERY BYPASS GRAFT N/A 11/29/2017    LIMA-LAD, SVG-PDA, SVG-CFX  Procedure: CORONARY ARTERY BYPASS GRAFTING (CABG) times three using the  left saphaneous vien. harvested endoscopicly and left internal mammary artery.;  Surgeon: Kerin Perna, MD;  Location: [facility] OR;  Service: Open Heart Surgery;  Laterality: N/A;  . EYE SURGERY     left eye surgery 04/2016  . IR CHOLANGIOGRAM EXISTING TUBE  10/17/2018  . IR CHOLANGIOGRAM EXISTING TUBE  10/30/2018  . IR PERC CHOLECYSTOSTOMY  09/05/2018  . KNEE SURGERY Left   . LOOP RECORDER INSERTION N/A 06/28/2018   Procedure: LOOP RECORDER INSERTION;  Surgeon: Duke Salvia, MD;  Location: [facility] INVASIVE CV LAB;  Service: Cardiovascular;  Laterality: N/A;  . RIGHT/LEFT HEART CATH AND CORONARY ANGIOGRAPHY N/A 10/19/2017   Procedure:  RIGHT/LEFT HEART CATH AND CORONARY ANGIOGRAPHY;  Surgeon: Swaziland, [name], MD;  Location: [facility] INVASIVE CV LAB;  Service: Cardiovascular;  Laterality: N/A;  . SHOULDER SURGERY Right   . TEE WITHOUT CARDIOVERSION N/A 11/29/2017   Procedure: TRANSESOPHAGEAL ECHOCARDIOGRAM (TEE);  Surgeon: Donata Clay, Theron Arista, MD;  Location: [facility] OR;  Service: Open Heart Surgery;  Laterality: N/A;  . TEE WITHOUT CARDIOVERSION N/A 06/28/2018   Procedure: TRANSESOPHAGEAL ECHOCARDIOGRAM (TEE);  Surgeon: Jake Bathe, MD;  Location: [facility] ENDOSCOPY;  Service: Cardiovascular;  Laterality: N/A;  . TOE AMPUTATION Left 11/28/15   4th toe    There were no vitals filed for this visit.  Subjective Assessment - 01/30/20 1015    Subjective  He is wearing prosthesis most of awake hours without issues. He fell when tripped over couch but denies injuries.    Pertinent History  L TTA, CAD, MI, CAGB 11/29/17, ischemic cardiomyopathy, chronic CHF, right tibial plateau fx 2016, DM2, peripheral neuropathy, HTN, osteoporosis, PVD, stroke,    Limitations  Walking    Patient Stated Goals  to get outside, be able to do yard work and [employer], walking outside home (unever terrain in yard), be more active, carry heavy items    Currently in Pain?  No/denies                       [facility] Adult PT Treatment/Exercise - 01/30/20 1015      Neuro Re-ed    Neuro Re-ed Details   HEP at sink to work on balance, proprioception and center of gravity (pelvis) over base of support (Feet).  See pt instructions.                Knee/Hip Exercises: Aerobic   Stationary Bike  Pt reports has someone who is going to give him a recumbent bike and wants to learn to use it.  PT demo & instructed in modifications for cycling with TTA prosthesis and how to determine seat adjustment.  PT rode for 3 minutes while PT educated.  PT recommended starting with sets work 5 min resit 3 sets and work time. Pt verbalized understanding.       Prosthetics    Prosthetic Care Comments   PT educated on sweat management & signs of sweating.  PT educated on adjusting ply socks and increased from 0-ply to 2-ply.      Current prosthetic wear tolerance (days/week)   daily    Current prosthetic wear tolerance (#hours/day)   most of awake hours.     Current prosthetic weight-bearing tolerance (hours/day)   Standing for 5 minutes with no limb pain but back pain / fatigue limiting standing tolerance.     Edema  none    Residual limb condition   dec hair growth below knee, dec coloration distally & slightly  colder, some redness distal thigh, dec scar mobility distal tibia, scar on tibial tuberosity that was from a fall > 6 months ago (not a concern)    Education Provided  Skin check;Residual limb care;Correct ply sock adjustment;Proper Donning;Proper wear schedule/adjustment;Other (comment)   see prosthetic care comments.    Person(s) Educated  Patient    Education Method  Explanation;Demonstration;Tactile cues;Verbal cues    Education Method  Verbalized understanding;Returned demonstration;Tactile cues required;Verbal cues required;Needs further instruction    Donning Prosthesis  Supervision               PT Short Term Goals - 11/27/19 1529      PT SHORT TERM GOAL #1   Title  Patient will verbalize independence with proper prosthetic cleaning. (All STGs due 12/27/2019)    Time  4    Period  Weeks    Status  New    Target Date  12/27/19      PT SHORT TERM GOAL #2   Title  Patient able to ambulate with head turns to scan environment without balance issues.    Time  4    Period  Weeks    Status  New    Target Date  12/27/19      PT SHORT TERM GOAL #3   Title  Patient will negotiate stairs with alternating pattern, with UE support, with supervision only.    Time  4    Period  Weeks    Status  New    Target Date  12/27/19      PT SHORT TERM GOAL #4   Title  Patient ambulates 500' including uneven terrain with prosthesis only with  supervision.    Time  4    Period  Weeks    Status  New    Target Date  12/27/19        PT Long Term Goals - 11/27/19 1536      PT LONG TERM GOAL #1   Title  Patient demonstrates and verbalizes independence with all aspects of prosthetic care to enable safe utilization of prosthesis. (All LTGs due 02/21/20)    Time  12    Period  Weeks    Status  New    Target Date  02/21/20      PT LONG TERM GOAL #2   Title  Patient improves FGA score >/= 20/30 for increased safety with gait for community ambulation.    Time  12    Period  Weeks    Status  New    Target Date  02/21/20      PT LONG TERM GOAL #3   Title  Patient will negotiate stairs with alternating pattern with single rail & step-to without rail modified independent to enable community access.    Time  12    Period  Weeks    Status  New    Target Date  02/21/20      PT LONG TERM GOAL #4   Title  Patient will ambulate >/= 1000' with prosthesis & cane or less negotiating obstacles, ramps, curbs and uneven terrain for increased functional independence for community ambulation    Time  12    Period  Weeks    Status  New    Target Date  02/21/20            Plan - 01/30/20 1528    Clinical Impression Statement  PT educated patient on sweat management and adjusting ply socks to decrease risk of  skin issues with prosthesis wear.  PT also educated on HEP at sink for blance & proprioception and recumbent bike use.    Personal Factors and Comorbidities  Comorbidity 3+;Fitness;Time since onset of injury/illness/exacerbation    Comorbidities  L TTA, CAD, MI, CAGB 11/29/17, ischemic cardiomyopathy, chronic CHF, right tibial plateau fx 2016, DM2, peripheral neuropathy, HTN, osteoporosis, PVD, stroke,    Examination-Activity Limitations  Stairs;Stand;Squat;Lift;Carry;Transfers    Examination-Participation Restrictions  Community Activity;Yard Work    [employer]  Evolving/Moderate complexity    Rehab  Potential  Good    PT Frequency  1x / week   d/t pt preference because of co-pay   PT Duration  12 weeks    PT Treatment/Interventions  ADLs/Self Care Home Management;Gait training;Stair training;Functional mobility training;Therapeutic activities;Therapeutic exercise;Balance training;Neuromuscular re-education;Patient/family education;Prosthetic Training;DME Instruction;Vestibular    PT Next Visit Plan  work towards [place], instruct in ramps, curbs & stairs,  prosthetic care,    Consulted and Agree with Plan of Care  Patient       Patient will benefit from skilled therapeutic intervention in order to improve the following deficits and impairments:  Abnormal gait, Decreased endurance, Decreased activity tolerance, Decreased balance, Decreased strength, Decreased range of motion, Impaired sensation, Decreased knowledge of use of DME, Decreased mobility, Impaired flexibility, Postural dysfunction, Prosthetic Dependency  Visit Diagnosis: Other abnormalities of gait and mobility  Unsteadiness on feet  Abnormal posture  Muscle weakness     Problem List Patient Active Problem List   Diagnosis Date Noted  . Below-knee amputation of left lower extremity ([place]) 04/18/2019  . Dehiscence of incision, sequela   . H/O amputation of lesser toe, left ([place]) 04/04/2019  . Foreign body (FB) in soft tissue   . Pressure injury of left foot, stage 2 ([place]) 02/14/2019  . PVD (peripheral vascular disease) ([place]) 02/14/2019  . Snoring 02/14/2019  . S/P laparoscopic cholecystectomy 01/04/2019  . Intractable vomiting   . Sepsis ([place]) 10/29/2018  . Abnormal finding on imaging 10/29/2018  . Cholecystostomy care ([place]) 09/05/2018  . Essential hypertension 06/27/2018  . Hyperlipidemia 06/27/2018  . Diabetic retinopathy ([place]) 06/27/2018  . Leukocytosis 06/27/2018  . Ischemic stroke ([place]) - mult small B infarcts s/p tPA, unknown embolic source 97/35/3299  . Left arm numbness 03/03/2018  . Strain of right  trapezius muscle 03/03/2018  . Ischemic cardiomyopathy 12/16/2017  . Major depressive disorder, recurrent episode, mild ([place]) 09/16/2017  . Acute on chronic systolic and diastolic heart failure, NYHA class 3 ([place]) 09/14/2017  . CAD in native artery 09/14/2017  . S/P BKA (below knee amputation) unilateral, left ([place]) 11/19/2016  . Short Achilles tendon (acquired), left ankle 11/19/2016  . Subacute osteomyelitis, left ankle and foot ([place]) 10/20/2016  . Gastroparesis due to DM ([place])   . History of diabetic ulcer of foot 11/14/2015  . Vitamin D deficiency 11/12/2015  . Osteoporosis 11/12/2015  . GERD (gastroesophageal reflux disease) 11/03/2015  . Erectile dysfunction 07/11/2009  . Allergic rhinitis 12/11/2008  . Diabetic peripheral neuropathy associated with type 2 diabetes mellitus ([place]) 08/30/2008  . Insomnia 08/24/2007  . Controlled type 2 diabetes mellitus with diabetic autonomic neuropathy, with long-term current use of insulin ([place]) 08/09/2007    Jamey Reas PT, DPT 01/30/2020, 3:30 PM  [facility] [address] [place], [place], [address] Phone: [phone]   Fax:  [phone]  Name: WHALEN TROMPETER MRN: [id number] Date of Birth: 1971-03-18

## 2020-01-30 NOTE — Patient Instructions (Signed)
Sweating increases with an amputation. Your body is trying to regulate your temperature & without an extremity, you sweat more easily to cool off. Also prosthetic material like liners do not breath and add hot layers which causes even more sweating. With time your body typically will accommodate to prosthesis and your sweat level will come closer to level with amputation but not pre-amputation level.   You need to pat your limb & liner dry when you notice sweating. If you leave sweat trapped inside your liner, then it can result in a blister.   Signs of sweating in your liner: 1. You are sweating elsewhere on your body or you notice sweat running / dripping.  2. Take note of how high your liner comes up on your limb when you first put your liner on your limb. If you notice that your liner has slipped down, then you probably have sweat inside your liner. A good time to check for liner slippage is when toileting.  3. You feel air bubbles inside your liner. When you liner slips, then air is allowed in bottom. As you put weight on prosthesis, the air is burp or pushed out. 4. You feel something crawling or moving inside your liner. When sweat runs inside the closed system of liner, it often feels like a bug or something crawling inside your liner.  If any of above symptoms are noted, you need to remove your prosthesis & liner to pat your limb & liner dry. This is permanent need as leaving sweat or water trapped can result in a blister or wound.    Hanger Socks: 1-ply is yellow color at top, 3-ply is green at top, 5-ply is navy blue at top How many ply you need depends on your limb size.  You should have even pressure on your limb when standing & walking.  Guidance points: 1. How ease it goes on? Should be some resistance. Too few it goes on too easily. Too many it takes a lot of work to get it on. 2. How high liner is on your thigh when you pull it up.  3. After standing or walking, check knee cap.  Bottom should be just under the front lip.  Too few bottom of knee cap sits on indention. Too many bottom is above front lip. 4. Have your feet beside each other & hips over feet. Place hands on your waist. Pelvis Should be level. Too few prosthetic side will be low. Too many prosthetic side will be high.    Get ply socks correct before you leave the house. Take extra socks with you. Take one 3-ply and two 1-ply with you. This is in addition to what you are wearing.    Do each exercise 1-2  times per day Do each exercise 10 repetitions Hold each exercise for 2 seconds to feel your location  AT [place].  Try to find this position when standing still for activities.   USE TAPE ON FLOOR TO MARK THE MIDLINE POSITION. You also should try to feel with your limb pressure in socket.  You are trying to feel with limb what you used to feel with the bottom of your foot.  5. Side to Side Shift: Moving your hips only (not shoulders): move weight onto your left leg, HOLD/FEEL.  Move back to equal weight on each leg, HOLD/FEEL. Move weight onto your right leg, HOLD/FEEL. Move back to equal weight  on each leg, HOLD/FEEL. Repeat.  Start with both hands on sink, progress to right hand only, then no hands.  6. Front to Back Shift: Moving your hips only (not shoulders): move your weight forward onto your toes, HOLD/FEEL. Move your weight back to equal Flat Foot on both legs, HOLD/FEEL. Move your weight back onto your heels, HOLD/FEEL. Move your weight back to equal on both legs, HOLD/FEEL. Repeat.   tart with both hands on sink, progress to right hand only, then no hands. 7. Moving Cones / Cups: With equal weight on each leg: Hold on with one hand the first time, then progress to no hand supports. Move cups from one side of sink to the other. Place cups ~2" out of your reach, progress to 10" beyond reach.  Place one hand in middle of sink and reach  with other hand. Do both arms.  Then hover one hand and move cups with other hand.  8. Overhead/Upward Reaching: alternated reaching up to top cabinets or ceiling if no cabinets present. Keep equal weight on each leg. Start with one hand support on counter while other hand reaches and progress to no hand support with reaching.  ace one hand in middle of sink and reach with other hand. Do both arms.  Then hover one hand and move cups with other hand.  5.   Looking Over Shoulders: With equal weight on each leg: alternate turning to look over your shoulders with one hand support on counter as needed.  Start with head motions only to look in front of shoulder, then even with shoulder and progress to looking behind you. To look to side, move head /eyes, then shoulder on side looking pulls back, shift more weight to side looking and pull hip back. ace one hand in middle of sink and let go with other hand so your shoulder can pull back. Switch hands to look other way.   Then hover one hand and move cups with other hand.  6.  Stepping with leg that is not amputated:  Move items under cabinet out of your way. Shift your hips/pelvis so weight on prosthesis. SLOWLY step other leg so front of foot is in cabinet. Then step back to floor.

## 2020-02-04 ENCOUNTER — Encounter: Payer: Medicare Other | Admitting: Physical Therapy

## 2020-02-06 ENCOUNTER — Encounter: Payer: Medicare Other | Admitting: Physical Therapy

## 2020-02-07 ENCOUNTER — Ambulatory Visit (INDEPENDENT_AMBULATORY_CARE_PROVIDER_SITE_OTHER): Payer: Medicare Other | Admitting: *Deleted

## 2020-02-07 DIAGNOSIS — I639 Cerebral infarction, unspecified: Secondary | ICD-10-CM | POA: Diagnosis not present

## 2020-02-07 LAB — CUP PACEART REMOTE DEVICE CHECK
Date Time Interrogation Session: 20210225000817
Implantable Pulse Generator Implant Date: 20190717

## 2020-02-07 NOTE — Progress Notes (Signed)
ILR Remote 

## 2020-02-11 ENCOUNTER — Encounter: Payer: Medicare Other | Admitting: Physical Therapy

## 2020-02-11 ENCOUNTER — Encounter: Payer: Self-pay | Admitting: Physical Therapy

## 2020-02-11 NOTE — Therapy (Signed)
[facility] [address] [place], [place], [address] Phone: [phone]   Fax:  [phone]  Patient Details  Name: Travis [name] MRN: [id number] Date of Birth: [birth year] Referring Provider:  No ref. provider found  Encounter Date: 02/11/2020  PHYSICAL THERAPY DISCHARGE SUMMARY  Visits from Start of Care: 2  Current functional level related to goals / functional outcomes Limited progress as patient only attended one PT session 2 months after PT evaluation. He requested discharge via phone call due to PT co-pay too expensive.    Remaining deficits: See PT evaluation on 11/27/2019 & Treatment on 01/30/2020.    Education / Equipment: Prosthetic Care Comments  Plan: Patient agrees to discharge.  Patient goals were not met. Patient is being discharged due to financial reasons.  ?????         Jamey Reas, PT, DPT 02/11/2020, 8:23 AM  [facility] [address] [place], [place], [address] Phone: [phone]   Fax:  [phone]

## 2020-02-12 ENCOUNTER — Encounter: Payer: Medicare Other | Admitting: Physical Therapy

## 2020-02-18 ENCOUNTER — Encounter: Payer: Medicare Other | Admitting: Physical Therapy

## 2020-02-19 ENCOUNTER — Encounter: Payer: Medicare Other | Admitting: Physical Therapy

## 2020-03-09 LAB — CUP PACEART REMOTE DEVICE CHECK
Date Time Interrogation Session: 20210328023747
Implantable Pulse Generator Implant Date: 20190717

## 2020-03-10 ENCOUNTER — Ambulatory Visit (INDEPENDENT_AMBULATORY_CARE_PROVIDER_SITE_OTHER): Payer: Medicare Other | Admitting: *Deleted

## 2020-03-10 DIAGNOSIS — I255 Ischemic cardiomyopathy: Secondary | ICD-10-CM

## 2020-03-10 NOTE — Progress Notes (Signed)
ILR Remote 

## 2020-03-12 ENCOUNTER — Other Ambulatory Visit: Payer: Self-pay

## 2020-03-12 NOTE — Telephone Encounter (Signed)
Pt's aunt, Drinda Butts LVM x2 regarding receiving refill on Plavix.   Pharmacy did not have updated provider information. Information given and submitted rx refill request electronically.   To PCP  Veronda Prude, RN

## 2020-03-13 ENCOUNTER — Other Ambulatory Visit: Payer: Self-pay | Admitting: *Deleted

## 2020-03-13 MED ORDER — CLOPIDOGREL BISULFATE 75 MG PO TABS: ORAL_TABLET | ORAL | 1 refills | Status: DC

## 2020-03-13 NOTE — Telephone Encounter (Signed)
Pt aunt is calling back to check the status of her nephews Plavix. He is out of medication as of today. Can we make sure that it went through electronically. I seen were it was sent but not received as of yet. jw

## 2020-03-19 ENCOUNTER — Other Ambulatory Visit: Payer: Self-pay

## 2020-03-21 ENCOUNTER — Other Ambulatory Visit: Payer: Self-pay | Admitting: *Deleted

## 2020-03-22 MED ORDER — METOCLOPRAMIDE HCL 10 MG PO TABS: 10.0000 mg | ORAL_TABLET | Freq: Four times a day (QID) | ORAL | 1 refills | Status: DC | PRN

## 2020-04-01 ENCOUNTER — Ambulatory Visit: Payer: Medicare Other | Admitting: Orthopedic Surgery

## 2020-04-04 ENCOUNTER — Encounter: Payer: Self-pay | Admitting: Physician Assistant

## 2020-04-04 ENCOUNTER — Ambulatory Visit (INDEPENDENT_AMBULATORY_CARE_PROVIDER_SITE_OTHER): Payer: Medicare Other | Admitting: Physician Assistant

## 2020-04-04 ENCOUNTER — Other Ambulatory Visit: Payer: Self-pay

## 2020-04-04 DIAGNOSIS — M86272 Subacute osteomyelitis, left ankle and foot: Secondary | ICD-10-CM

## 2020-04-04 NOTE — Progress Notes (Signed)
Office Visit Note   Patient: Travis [name]           Date of Birth: 11-08-1971           MRN: [id number] Visit Date: 04/04/2020              Requested by: Arlyce Harman, DO [address] [place],  [place] [address] PCP: Arlyce Harman, DO  Chief Complaint  Patient presents with  . Left Knee - Pain, Follow-up      HPI: This is a pleasant gentleman who presents today requesting a new foot be made for his left below-knee amputation prosthetic he currently has a wound foot and it has completely broken.  It is unsafe with ambulation.  He is also been complaining of some right forefoot pain.  It is a electrical pain that shoots up his leg  Assessment & Plan: Visit Diagnoses: No diagnosis found.  Plan: New prescription was written for a shoe carbon.  The wooden shoe has broken down in a short period of time and considering his level of activity as a K3 ambulator he does need something more sturdy.  With regards to his right foot I think this is more compensatory as he has had to alter his gait so he does not trip because of his broken shoe on the left.  I do not see any signs of an infective process.  He also has a very tight heel cord that we discussed stretching and could be the result of some of his issues.  If he notices any changes he should follow-up immediately  Follow-Up Instructions: No follow-ups on file.   Ortho Exam  Patient is alert, oriented, no adenopathy, well-dressed, normal affect, normal respiratory effort. Left below-knee amputation prosthetic.  Foot piece is completely broken and poses a tripping hazard with ambulation.  Amputation stump is healed without any issues  Right foot: No swelling no ulcers no tenderness to palpation he does have a very tight heel cord and comes to only a neutral position there is no fluctuance or cellulitis  Imaging: No results found. No images are attached to the encounter.  Labs: Lab Results  Component Value Date    HGBA1C 6.9 (H) 12/28/2018   HGBA1C 8.0 (A) 10/23/2018   HGBA1C 6.9 (H) 06/26/2018   ESRSEDRATE 1 11/09/2016   ESRSEDRATE 24 (H) 10/06/2016   CRP 0.6 11/09/2016   CRP 6.3 (H) 10/06/2016   REPTSTATUS 11/03/2018 FINAL 10/29/2018   REPTSTATUS 11/03/2018 FINAL 10/29/2018   CULT  10/29/2018    NO GROWTH 5 DAYS Performed at [facility], [address], [place], [place] [address]    CULT  10/29/2018    NO GROWTH 5 DAYS Performed at [facility], [address], [place], [place] [address]      Lab Results  Component Value Date   ALBUMIN 3.5 04/18/2019   ALBUMIN 4.2 12/28/2018   ALBUMIN 4.4 11/21/2018   PREALBUMIN 20.2 11/11/2015    Lab Results  Component Value Date   MG 2.0 11/01/2018   MG 2.1 11/30/2017   MG 2.3 11/30/2017   Lab Results  Component Value Date   VD25OH 17.8 (L) 11/11/2015    Lab Results  Component Value Date   PREALBUMIN 20.2 11/11/2015   CBC EXTENDED Latest Ref Rng & Units 04/18/2019 03/09/2019 12/28/2018  WBC 4.0 - 10.5 K/uL 10.1 9.7 7.8  RBC 4.22 - 5.81 MIL/uL 4.62 4.61 4.66  HGB 13.0 - 17.0 g/dL 39.0 13.7  13.6  HCT 39.0 - 52.0 % 39.8 40.8 42.1  PLT 150 - 400 K/uL 361 275 296  NEUTROABS 1.7 - 7.7 K/uL - - 5.3  LYMPHSABS 0.7 - 4.0 K/uL - - 1.7     There is no height or weight on file to calculate BMI.  Orders:  No orders of the defined types were placed in this encounter.  No orders of the defined types were placed in this encounter.    Procedures: No procedures performed  Clinical Data: No additional findings.  ROS:  All other systems negative, except as noted in the HPI. Review of Systems  Objective: Vital Signs: There were no vitals taken for this visit.  Specialty Comments:  No specialty comments available.  PMFS History: Patient Active Problem List   Diagnosis Date Noted  . Below-knee amputation of left lower extremity (HCC) 04/18/2019  . Dehiscence of incision, sequela   . H/O amputation of lesser toe, left  (HCC) 04/04/2019  . Foreign body (FB) in soft tissue   . Pressure injury of left foot, stage 2 (HCC) 02/14/2019  . PVD (peripheral vascular disease) (HCC) 02/14/2019  . Snoring 02/14/2019  . S/P laparoscopic cholecystectomy 01/04/2019  . Intractable vomiting   . Sepsis (HCC) 10/29/2018  . Abnormal finding on imaging 10/29/2018  . Cholecystostomy care (HCC) 09/05/2018  . Essential hypertension 06/27/2018  . Hyperlipidemia 06/27/2018  . Diabetic retinopathy (HCC) 06/27/2018  . Leukocytosis 06/27/2018  . Ischemic stroke (HCC) - mult small B infarcts s/p tPA, unknown embolic source 06/25/2018  . Left arm numbness 03/03/2018  . Strain of right trapezius muscle 03/03/2018  . Ischemic cardiomyopathy 12/16/2017  . Major depressive disorder, recurrent episode, mild (HCC) 09/16/2017  . Acute on chronic systolic and diastolic heart failure, NYHA class 3 (HCC) 09/14/2017  . CAD in native artery 09/14/2017  . S/P BKA (below knee amputation) unilateral, left (HCC) 11/19/2016  . Short Achilles tendon (acquired), left ankle 11/19/2016  . Subacute osteomyelitis, left ankle and foot (HCC) 10/20/2016  . Gastroparesis due to DM (HCC)   . History of diabetic ulcer of foot 11/14/2015  . Vitamin D deficiency 11/12/2015  . Osteoporosis 11/12/2015  . GERD (gastroesophageal reflux disease) 11/03/2015  . Erectile dysfunction 07/11/2009  . Allergic rhinitis 12/11/2008  . 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 Insomnia 08/24/2007  . Controlled type 2 diabetes mellitus with diabetic autonomic neuropathy, with long-term current use of insulin (HCC) 08/09/2007   Past Medical History:  Diagnosis Date  . Acute on chronic systolic and diastolic heart failure, NYHA class 3 (HCC) 09/14/2017  . Acute osteomyelitis of metatarsal bone of left foot (HCC) 11/12/2015  . CAD in native artery 09/14/2017  . Closed fracture of right tibial plateau 11/11/2015  . Coronary  artery disease   . Dehiscence of amputation stump (HCC)    left leg  . Depression with anxiety   . Diabetes mellitus    INSULIN DEPENDENT Type II  . Diabetic peripheral neuropathy associated with type 2 diabetes mellitus (HCC) 08/30/2008   Qualifier: Diagnosis of  By: Daphine Deutscher FNP, Zena Amos    . Diabetic ulcer of left foot (HCC) 11/14/2015  . Gastroparesis   . GERD (gastroesophageal reflux disease)   . History of loop recorder 06/2018   Last remote device check was on 02/14/2019  . Hypertension    patient denies  . Left ventricular aneurysm    REPORTS HE IS NOT AWAREW OF THIS   . Myocardial infarction (  HCC)   . Osteomyelitis (HCC)   . Osteoporosis 11/12/2015  . Peripheral neuropathy    feet and below bilateral knees  . Peripheral vascular disease (HCC)    PAD in Left leg and some in right leg  . Shortness of breath dyspnea   . Stroke (HCC) 05/2018  . Vitamin D deficiency 11/12/2015    Family History  Problem Relation Age of Onset  . Cancer Paternal Grandmother   . COPD Mother   . Heart failure Mother   . Anxiety disorder Mother   . Depression Mother   . Fibromyalgia Mother   . Esophageal cancer Father        Smoker, still does  . Diabetes Sister   . Fibromyalgia Sister   . Diabetes Maternal Uncle   . Diabetes Maternal Grandmother   . GER disease Sister   . Liver disease Paternal Grandfather   . Hemachromatosis Paternal Grandfather   . Diabetes Maternal Grandfather     Past Surgical History:  Procedure Laterality Date  . AMPUTATION Left 11/28/2015   Procedure: Left 4th Ray Amputation;  Surgeon: Nadara Mustard, MD;  Location: [facility] OR;  Service: Orthopedics;  Laterality: Left;  . AMPUTATION Left 10/29/2016   Procedure: LEFT 5TH RAY AMPUTATION;  Surgeon: Nadara Mustard, MD;  Location: [facility] OR;  Service: Orthopedics;  Laterality: Left;  . AMPUTATION Left 03/09/2019   Procedure: LEFT FOOT 5TH RAY AMPUTATION;  Surgeon: Nadara Mustard, MD;  Location: [facility] OR;  Service: Orthopedics;   Laterality: Left;  . AMPUTATION Left 04/18/2019   Procedure: LEFT BELOW KNEE AMPUTATION;  Surgeon: Nadara Mustard, MD;  Location: [facility] OR;  Service: Orthopedics;  Laterality: Left;  . ANTERIOR VITRECTOMY Left 04/12/2016   Procedure: ANTERIOR CHAMBER WASH OUT WITH GAS FLUID EXCHANGE;  Surgeon: Carmela Rima, MD;  Location: [facility] OR;  Service: Ophthalmology;  Laterality: Left;  . CARDIAC CATHETERIZATION  10/2017  . CATARACT EXTRACTION Left   . CHOLECYSTECTOMY N/A 01/04/2019   Procedure: LAPAROSCOPIC CHOLECYSTECTOMY;  Surgeon: Gaynelle Adu, MD;  Location: [facility] ORS;  Service: General;  Laterality: N/A;  . CORONARY ARTERY BYPASS GRAFT N/A 11/29/2017    LIMA-LAD, SVG-PDA, SVG-CFX  Procedure: CORONARY ARTERY BYPASS GRAFTING (CABG) times three using the left saphaneous vien. harvested endoscopicly and left internal mammary artery.;  Surgeon: Kerin Perna, MD;  Location: [facility] OR;  Service: Open Heart Surgery;  Laterality: N/A;  . EYE SURGERY     left eye surgery 04/2016  . IR CHOLANGIOGRAM EXISTING TUBE  10/17/2018  . IR CHOLANGIOGRAM EXISTING TUBE  10/30/2018  . IR PERC CHOLECYSTOSTOMY  09/05/2018  . KNEE SURGERY Left   . LOOP RECORDER INSERTION N/A 06/28/2018   Procedure: LOOP RECORDER INSERTION;  Surgeon: Duke Salvia, MD;  Location: [facility] INVASIVE CV LAB;  Service: Cardiovascular;  Laterality: N/A;  . RIGHT/LEFT HEART CATH AND CORONARY ANGIOGRAPHY N/A 10/19/2017   Procedure: RIGHT/LEFT HEART CATH AND CORONARY ANGIOGRAPHY;  Surgeon: Swaz[place], [name], MD;  Location: [facility] INVASIVE CV LAB;  Service: Cardiovascular;  Laterality: N/A;  . SHOULDER SURGERY Right   . TEE WITHOUT CARDIOVERSION N/A 11/29/2017   Procedure: TRANSESOPHAGEAL ECHOCARDIOGRAM (TEE);  Surgeon: Donata Clay, Theron Arista, MD;  Location: [facility] OR;  Service: Open Heart Surgery;  Laterality: N/A;  . TEE WITHOUT CARDIOVERSION N/A 06/28/2018   Procedure: TRANSESOPHAGEAL ECHOCARDIOGRAM (TEE);  Surgeon: Jake Bathe, MD;  Location: [facility] ENDOSCOPY;  Service:  Cardiovascular;  Laterality: N/A;  . TOE AMPUTATION Left 11/28/15   4th toe   Social  History   Occupational History  . Occupation: Disabled  Tobacco Use  . Smoking status: Never Smoker  . Smokeless tobacco: Never Used  Substance and Sexual Activity  . Alcohol use: No  . Drug use: No  . Sexual activity: Yes

## 2020-04-10 LAB — CUP PACEART REMOTE DEVICE CHECK
Date Time Interrogation Session: 20210428233221
Implantable Pulse Generator Implant Date: 20190717

## 2020-04-14 ENCOUNTER — Ambulatory Visit (INDEPENDENT_AMBULATORY_CARE_PROVIDER_SITE_OTHER): Payer: Medicare Other | Admitting: *Deleted

## 2020-04-14 DIAGNOSIS — I5043 Acute on chronic combined systolic (congestive) and diastolic (congestive) heart failure: Secondary | ICD-10-CM

## 2020-04-15 ENCOUNTER — Ambulatory Visit: Payer: Medicare Other | Admitting: Orthopedic Surgery

## 2020-04-15 NOTE — Progress Notes (Signed)
[facility] Summary Report / Loop Recorder 

## 2020-04-25 ENCOUNTER — Other Ambulatory Visit: Payer: Self-pay

## 2020-04-28 MED ORDER — METFORMIN HCL ER 750 MG PO TB24: 750.0000 mg | ORAL_TABLET | Freq: Two times a day (BID) | ORAL | 2 refills | Status: DC

## 2020-04-30 ENCOUNTER — Other Ambulatory Visit: Payer: Self-pay | Admitting: Cardiovascular Disease

## 2020-05-13 LAB — CUP PACEART REMOTE DEVICE CHECK
Date Time Interrogation Session: 20210529233442
Implantable Pulse Generator Implant Date: 20190717

## 2020-06-04 ENCOUNTER — Encounter: Payer: Self-pay | Admitting: Physician Assistant

## 2020-06-04 ENCOUNTER — Ambulatory Visit (INDEPENDENT_AMBULATORY_CARE_PROVIDER_SITE_OTHER): Payer: Medicare Other | Admitting: Physician Assistant

## 2020-06-04 ENCOUNTER — Ambulatory Visit: Payer: Self-pay

## 2020-06-04 ENCOUNTER — Other Ambulatory Visit: Payer: Self-pay

## 2020-06-04 VITALS — Ht 74.0 in | Wt 215.0 lb

## 2020-06-04 DIAGNOSIS — M25561 Pain in right knee: Secondary | ICD-10-CM | POA: Diagnosis not present

## 2020-06-04 DIAGNOSIS — G8929 Other chronic pain: Secondary | ICD-10-CM | POA: Diagnosis not present

## 2020-06-04 NOTE — Progress Notes (Signed)
Office Visit Note   Patient: Travis [name]           Date of Birth: 12-04-1971           MRN: [id number] Visit Date: 06/04/2020              Requested by: Arlyce Harman, DO [address] [place],  [place] [address] PCP: Arlyce Harman, DO  Chief Complaint  Patient presents with  . Right Leg - Pain      HPI: This is a pleasant 49 year old gentleman who is status post left below-knee amputation.  He was seen fairly recently because the foot upon his prosthetic had broken and a prescription was written for a new one.  He presents today because he has shooting pain that runs up and down his lower leg.  It does not extend above the knee and it is intermittent.  Sometimes it goes from his great toe upward and sometimes from his knee downwards.  He has a remote history of a tibial plateau fracture on the right which was never fixed.  He does say that this all began when he had to alter his gait while his foot was broken it seemed to go away for a while once he received his new prosthetic but it is now returned  Assessment & Plan: Visit Diagnoses:  1. 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  He is going to for the next 3 weeks aggressively try to stretch his Achilles this could be the cause of his issues.  I do not see any infective process.  He will follow-up in 3 weeks for reevaluation  Follow-Up Instructions: No follow-ups on file.   Ortho Exam  Patient is alert, oriented, no adenopathy, well-dressed, normal affect, normal respiratory effort. Right lower extremity he has no swelling no ecchymosis no cellulitis skin is in good condition he does not have any tenderness to palpation from his knee down.  No straight leg raise.  He comes just to a neutral position with flexion of his ankle good distal pulses compartments are soft and nontender  Imaging: No results found. No images are attached to the encounter.  Labs: Lab  Results  Component Value Date   HGBA1C 6.9 (H) 12/28/2018   HGBA1C 8.0 (A) 10/23/2018   HGBA1C 6.9 (H) 06/26/2018   ESRSEDRATE 1 11/09/2016   ESRSEDRATE 24 (H) 10/06/2016   CRP 0.6 11/09/2016   CRP 6.3 (H) 10/06/2016   REPTSTATUS 11/03/2018 FINAL 10/29/2018   REPTSTATUS 11/03/2018 FINAL 10/29/2018   CULT  10/29/2018    NO GROWTH 5 DAYS Performed at [facility], [address], [place], [place] [address]    CULT  10/29/2018    NO GROWTH 5 DAYS Performed at [facility], [address] [place], [place] [address]      Lab Results  Component Value Date   ALBUMIN 3.5 04/18/2019   ALBUMIN 4.2 12/28/2018   ALBUMIN 4.4 11/21/2018   PREALBUMIN 20.2 11/11/2015    Lab Results  Component Value Date   MG 2.0 11/01/2018   MG 2.1 11/30/2017   MG 2.3 11/30/2017   Lab Results  Component Value Date   VD25OH 17.8 (L) 11/11/2015    Lab Results  Component Value Date   PREALBUMIN 20.2 11/11/2015   CBC EXTENDED Latest Ref Rng & Units 04/18/2019 03/09/2019 12/28/2018  WBC 4.0 - 10.5 K/uL 10.1 9.7 7.8  RBC 4.22 -  5.81 MIL/uL 4.62 4.61 4.66  HGB 13.0 - 17.0 g/dL 38.2 50.5 39.7  HCT 39 - 52 % 39.8 40.8 42.1  PLT 150 - 400 K/uL 361 275 296  NEUTROABS 1.7 - 7.7 K/uL - - 5.3  LYMPHSABS 0.7 - 4.0 K/uL - - 1.7     Body mass index is 27.6 kg/m.  Orders:  Orders Placed This Encounter  Procedures  . XR Knee 1-2 Views Right   No orders of the defined types were placed in this encounter.    Procedures: No procedures performed  Clinical Data: No additional findings.  ROS:  All other systems negative, except as noted in the HPI. Review of Systems  Objective: Vital Signs: Ht 6\' 2"  (1.88 m)   Wt 215 lb (97.5 kg)   BMI 27.60 kg/m   Specialty Comments:  No specialty comments available.  PMFS History: Patient Active Problem List   Diagnosis Date Noted  . Below-knee amputation of left lower extremity (HCC) 04/18/2019  . Dehiscence of incision, sequela   . H/O  amputation of lesser toe, left (HCC) 04/04/2019  . Foreign body (FB) in soft tissue   . Pressure injury of left foot, stage 2 (HCC) 02/14/2019  . PVD (peripheral vascular disease) (HCC) 02/14/2019  . Snoring 02/14/2019  . S/P laparoscopic cholecystectomy 01/04/2019  . Intractable vomiting   . Sepsis (HCC) 10/29/2018  . Abnormal finding on imaging 10/29/2018  . Cholecystostomy care (HCC) 09/05/2018  . Essential hypertension 06/27/2018  . Hyperlipidemia 06/27/2018  . Diabetic retinopathy (HCC) 06/27/2018  . Leukocytosis 06/27/2018  . Ischemic stroke (HCC) - mult small B infarcts s/p tPA, unknown embolic source 06/25/2018  . Left arm numbness 03/03/2018  . Strain of right trapezius muscle 03/03/2018  . Ischemic cardiomyopathy 12/16/2017  . Major depressive disorder, recurrent episode, mild (HCC) 09/16/2017  . Acute on chronic systolic and diastolic heart failure, NYHA class 3 (HCC) 09/14/2017  . CAD in native artery 09/14/2017  . S/P BKA (below knee amputation) unilateral, left (HCC) 11/19/2016  . Short Achilles tendon (acquired), left ankle 11/19/2016  . Subacute osteomyelitis, left ankle and foot (HCC) 10/20/2016  . Gastroparesis due to DM (HCC)   . History of diabetic ulcer of foot 11/14/2015  . Vitamin D deficiency 11/12/2015  . Osteoporosis 11/12/2015  . GERD (gastroesophageal reflux disease) 11/03/2015  . Erectile dysfunction 07/11/2009  . Allergic rhinitis 12/11/2008  . 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 Insomnia 08/24/2007  . Controlled type 2 diabetes mellitus with diabetic autonomic neuropathy, with long-term current use of insulin (HCC) 08/09/2007   Past Medical History:  Diagnosis Date  . Acute on chronic systolic and diastolic heart failure, NYHA class 3 (HCC) 09/14/2017  . Acute osteomyelitis of metatarsal bone of left foot (HCC) 11/12/2015  . CAD in native artery 09/14/2017  . Closed fracture of right tibial  plateau 11/11/2015  . Coronary artery disease   . Dehiscence of amputation stump (HCC)    left leg  . Depression with anxiety   . Diabetes mellitus    INSULIN DEPENDENT Type II  . 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 Diabetic ulcer of left foot (HCC) 11/14/2015  . Gastroparesis   . GERD (gastroesophageal reflux disease)   . History of loop recorder 06/2018   Last remote device check was on 02/14/2019  . Hypertension    patient  denies  . Left ventricular aneurysm    REPORTS HE IS NOT AWAREW OF THIS   . Myocardial infarction ([place])   . Osteomyelitis ([place])   . Osteoporosis 11/12/2015  . Peripheral neuropathy    feet and below bilateral knees  . Peripheral vascular disease (HCC)    PAD in Left leg and some in right leg  . Shortness of breath dyspnea   . Stroke ([place]) 05/2018  . Vitamin D deficiency 11/12/2015    Family History  Problem Relation Age of Onset  . Cancer Paternal Grandmother   . COPD Mother   . Heart failure Mother   . Anxiety disorder Mother   . Depression Mother   . Fibromyalgia Mother   . Esophageal cancer Father        Smoker, still does  . Diabetes Sister   . Fibromyalgia Sister   . Diabetes Maternal Uncle   . Diabetes Maternal Grandmother   . GER disease Sister   . Liver disease Paternal Grandfather   . Hemachromatosis Paternal Grandfather   . Diabetes Maternal Grandfather     Past Surgical History:  Procedure Laterality Date  . AMPUTATION Left 11/28/2015   Procedure: Left 4th Ray Amputation;  Surgeon: [name], MD;  Location: [place];  Service: Orthopedics;  Laterality: Left;  . AMPUTATION Left 10/29/2016   Procedure: LEFT 5TH RAY AMPUTATION;  Surgeon: [name], MD;  Location: [place];  Service: Orthopedics;  Laterality: Left;  . AMPUTATION Left 03/09/2019   Procedure: LEFT FOOT 5TH RAY AMPUTATION;  Surgeon: [name], MD;  Location:  [place];  Service: Orthopedics;  Laterality: Left;  . AMPUTATION Left 04/18/2019   Procedure: LEFT BELOW KNEE AMPUTATION;  Surgeon: [name], MD;  Location: [place];  Service: Orthopedics;  Laterality: Left;  . ANTERIOR VITRECTOMY Left 04/12/2016   Procedure: ANTERIOR CHAMBER [place] OUT WITH GAS FLUID EXCHANGE;  Surgeon: [name], MD;  Location: [place];  Service: Ophthalmology;  Laterality: Left;  . CARDIAC CATHETERIZATION  10/2017  . CATARACT EXTRACTION Left   . CHOLECYSTECTOMY N/A 01/04/2019   Procedure: LAPAROSCOPIC CHOLECYSTECTOMY;  Surgeon: [name], MD;  Location: [facility]S;  Service: General;  Laterality: N/A;  . CORONARY ARTERY BYPASS GRAFT N/A 11/29/2017    LIMA-LAD, SVG-PDA, SVG-CFX  Procedure: CORONARY ARTERY BYPASS GRAFTING (CABG) times three using the left saphaneous vien. harvested endoscopicly and left internal mammary artery.;  Surgeon: [name], MD;  Location: [place];  Service: Open Heart Surgery;  Laterality: N/A;  . EYE SURGERY     left eye surgery 04/2016  . IR CHOLANGIOGRAM EXISTING TUBE  10/17/2018  . IR CHOLANGIOGRAM EXISTING TUBE  10/30/2018  . IR PERC CHOLECYSTOSTOMY  09/05/2018  . KNEE SURGERY Left   . LOOP RECORDER INSERTION N/A 06/28/2018   Procedure: LOOP RECORDER INSERTION;  Surgeon: Deboraha Sprang, MD;  Location: [place] CV LAB;  Service: Cardiovascular;  Laterality: N/A;  . RIGHT/LEFT HEART CATH AND CORONARY ANGIOGRAPHY N/A 10/19/2017   Procedure: RIGHT/LEFT HEART CATH AND CORONARY ANGIOGRAPHY;  Surgeon: [name], [name], MD;  Location: [place] CV LAB;  Service: Cardiovascular;  Laterality: N/A;  . SHOULDER SURGERY Right   . TEE WITHOUT CARDIOVERSION N/A 11/29/2017   Procedure: TRANSESOPHAGEAL ECHOCARDIOGRAM (TEE);  Surgeon: [name], Collier Salina, MD;  Location: [place];  Service: Open Heart Surgery;  Laterality: N/A;  . TEE WITHOUT CARDIOVERSION N/A 06/28/2018   Procedure: TRANSESOPHAGEAL ECHOCARDIOGRAM (TEE);  Surgeon: [name], MD;  Location: [facility]  ENDOSCOPY;  Service: Cardiovascular;  Laterality: N/A;  . TOE AMPUTATION Left 11/28/15   4th toe   Social History   Occupational History  . Occupation: Disabled  Tobacco Use  . Smoking status: Never Smoker  . Smokeless tobacco: Never Used  Vaping Use  . Vaping Use: Never used  Substance and Sexual Activity  . Alcohol use: No  . Drug use: No  . Sexual activity: Yes

## 2020-06-13 ENCOUNTER — Ambulatory Visit (INDEPENDENT_AMBULATORY_CARE_PROVIDER_SITE_OTHER): Payer: Medicare Other | Admitting: *Deleted

## 2020-06-13 DIAGNOSIS — I639 Cerebral infarction, unspecified: Secondary | ICD-10-CM | POA: Diagnosis not present

## 2020-06-13 LAB — CUP PACEART REMOTE DEVICE CHECK
Date Time Interrogation Session: 20210701230358
Implantable Pulse Generator Implant Date: 20190717

## 2020-06-17 NOTE — Progress Notes (Signed)
[facility] Summary Report / Loop Recorder 

## 2020-06-20 ENCOUNTER — Ambulatory Visit: Payer: Medicare Other | Admitting: Physician Assistant

## 2020-06-26 ENCOUNTER — Ambulatory Visit: Payer: Medicare Other | Admitting: Orthopedic Surgery

## 2020-07-19 LAB — CUP PACEART REMOTE DEVICE CHECK
Date Time Interrogation Session: 20210803231616
Implantable Pulse Generator Implant Date: 20190717

## 2020-07-21 ENCOUNTER — Ambulatory Visit (INDEPENDENT_AMBULATORY_CARE_PROVIDER_SITE_OTHER): Payer: Medicare Other | Admitting: *Deleted

## 2020-07-21 DIAGNOSIS — I639 Cerebral infarction, unspecified: Secondary | ICD-10-CM | POA: Diagnosis not present

## 2020-07-22 ENCOUNTER — Other Ambulatory Visit: Payer: Self-pay

## 2020-07-22 ENCOUNTER — Encounter: Payer: Self-pay | Admitting: Physician Assistant

## 2020-07-22 ENCOUNTER — Ambulatory Visit (INDEPENDENT_AMBULATORY_CARE_PROVIDER_SITE_OTHER): Payer: Medicare Other | Admitting: Physician Assistant

## 2020-07-22 VITALS — BP 120/70 | HR 76 | Temp 98.3°F | Resp 16 | Ht 72.0 in | Wt 244.0 lb

## 2020-07-22 DIAGNOSIS — K219 Gastro-esophageal reflux disease without esophagitis: Secondary | ICD-10-CM

## 2020-07-22 DIAGNOSIS — E1143 Type 2 diabetes mellitus with diabetic autonomic (poly)neuropathy: Secondary | ICD-10-CM | POA: Diagnosis not present

## 2020-07-22 DIAGNOSIS — N289 Disorder of kidney and ureter, unspecified: Secondary | ICD-10-CM

## 2020-07-22 DIAGNOSIS — I255 Ischemic cardiomyopathy: Secondary | ICD-10-CM

## 2020-07-22 DIAGNOSIS — Z794 Long term (current) use of insulin: Secondary | ICD-10-CM | POA: Diagnosis not present

## 2020-07-22 DIAGNOSIS — R5383 Other fatigue: Secondary | ICD-10-CM | POA: Diagnosis not present

## 2020-07-22 DIAGNOSIS — K3184 Gastroparesis: Secondary | ICD-10-CM

## 2020-07-22 DIAGNOSIS — E1142 Type 2 diabetes mellitus with diabetic polyneuropathy: Secondary | ICD-10-CM | POA: Diagnosis not present

## 2020-07-22 MED ORDER — PANTOPRAZOLE SODIUM 40 MG PO TBEC
40.0000 mg | DELAYED_RELEASE_TABLET | Freq: Every day | ORAL | 3 refills | Status: DC
Start: 2020-07-22 — End: 2024-01-02

## 2020-07-22 NOTE — Progress Notes (Signed)
Patient presents to clinic today with his sister to establish care.  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 Patient has not seen a regular PCP in the past year. Was previously seen by the [facility] Medicine Service at [facility]. Patient is followed by specialists including Ophthalmology Posey Pronto), Cardiology Oval Linsey), Orthopedics Sharol Given) and Gastroenterology Oletta Lamas).   Hypertension, CAD, CHF, history of MI and CVA -- As previously mentioned is followed by Cardiology Oval Linsey). Is currently on a regimen that includes Toprol XL 12.5 mg daily, Crestor 40 mg daily, Plavix 75 mg daily and Lasix 20 mg daily. Endorses taking medications as directed and tolerating well overall. Patient denies chest pain, palpitations, lightheadedness, dizziness, vision changes or frequent headaches.  BP Readings from Last 3 Encounters:  07/22/20 120/70  04/20/19 107/63  03/09/19 131/74   Diabetes Mellitus II, with Diabetic Retinopathy and Neuropathy -- Patient currently on a regimen of Metformin XR 750 mg BID, LEvemir 20 units daily. Endorses taking medications as directed, in need of refills. Is overdue for repeat labs including A1C. Is followed by Ophthalmology, Dr. Posey Pronto with UTD eye examinations. Denies any foot concerns today.  GERD -- Also with diabetic gastroparesis. These are currently trying to be controlled with dietary changes, prevacid and reglan. Notes gastroparesis seems to be controlled overall but having breakthrough heartburn despite current PPI. Notes this has been going on for about 2 months. Notes so significant he has hesitancy about eating. Is trying to keep well-hydrated. Has follow-up scheduled with GI.  Patient also notes history of a L renal lesion, noted incidentally on a CT scan a few years ago. Was told he needed a follow-up CT or MRI in 6 months but never  had completed. Records are available in the EMR and have been reviewed.    Past Medical History:  Diagnosis Date  . Acute on chronic systolic and diastolic heart failure, NYHA class 3 ([place]) 09/14/2017  . Acute osteomyelitis of metatarsal bone of left foot ([place]) 11/12/2015  . CAD in native artery 09/14/2017  . Closed fracture of right tibial plateau 11/11/2015  . Coronary artery disease   . Dehiscence of amputation stump (HCC)    left leg  . Depression with anxiety   . Diabetes mellitus    INSULIN DEPENDENT Type II  . Diabetic peripheral neuropathy associated with type 2 diabetes mellitus ([place]) 08/30/2008   Qualifier: Diagnosis of  By: [name] Done FNP, Tori Milks    . Diabetic ulcer of left foot ([place]) 11/14/2015  . Gastroparesis   . GERD (gastroesophageal reflux disease)   . History of loop recorder 06/2018   Last remote device check was on 02/14/2019  . Hypertension    patient denies  . Left ventricular aneurysm    REPORTS HE IS NOT AWAREW OF THIS   . Myocardial infarction ([place])   . Osteomyelitis ([place])   . Osteoporosis 11/12/2015  . Peripheral neuropathy    feet and below bilateral knees  . Peripheral vascular disease (HCC)    PAD in Left leg and some in right leg  . Shortness of breath dyspnea   . Stroke ([place]) 05/2018  . Vitamin D deficiency 11/12/2015    Past Surgical History:  Procedure Laterality Date  . AMPUTATION Left 11/28/2015   Procedure: Left 4th Ray Amputation;  Surgeon: [name], MD;  Location: [place];  Service: Orthopedics;  Laterality: Left;  .  AMPUTATION Left 10/29/2016   Procedure: LEFT 5TH RAY AMPUTATION;  Surgeon: [name], MD;  Location: [place];  Service: Orthopedics;  Laterality: Left;  . AMPUTATION Left 03/09/2019   Procedure: LEFT FOOT 5TH RAY AMPUTATION;  Surgeon: [name], MD;  Location: [place];  Service: Orthopedics;  Laterality: Left;  . AMPUTATION Left 04/18/2019   Procedure: LEFT BELOW KNEE AMPUTATION;  Surgeon: [name], MD;  Location: [place];  Service: Orthopedics;  Laterality: Left;  . ANTERIOR VITRECTOMY Left 04/12/2016   Procedure: ANTERIOR CHAMBER [place] OUT WITH GAS FLUID EXCHANGE;  Surgeon: [name], MD;  Location: [place];  Service: Ophthalmology;  Laterality: Left;  . CARDIAC CATHETERIZATION  10/2017  . CATARACT EXTRACTION Left   . CHOLECYSTECTOMY N/A 01/04/2019   Procedure: LAPAROSCOPIC CHOLECYSTECTOMY;  Surgeon: [name], MD;  Location: [facility];  Service: General;  Laterality: N/A;  . CORONARY ARTERY BYPASS GRAFT N/A 11/29/2017    LIMA-LAD, SVG-PDA, SVG-CFX  Procedure: CORONARY ARTERY BYPASS GRAFTING (CABG) times three using the left saphaneous vien. harvested endoscopicly and left internal mammary artery.;  Surgeon: [name], MD;  Location: [place];  Service: Open Heart Surgery;  Laterality: N/A;  . EYE SURGERY     left eye surgery 04/2016  . IR CHOLANGIOGRAM EXISTING TUBE  10/17/2018  . IR CHOLANGIOGRAM EXISTING TUBE  10/30/2018  . IR PERC CHOLECYSTOSTOMY  09/05/2018  . KNEE SURGERY Left   . LOOP RECORDER INSERTION N/A 06/28/2018   Procedure: LOOP RECORDER INSERTION;  Surgeon: Deboraha Sprang, MD;  Location: [place] [facility];  Service: Cardiovascular;  Laterality: N/A;  . RIGHT/LEFT HEART CATH AND CORONARY ANGIOGRAPHY N/A 10/19/2017   Procedure: RIGHT/LEFT HEART CATH AND CORONARY ANGIOGRAPHY;  Surgeon: [name], [name], MD;  Location: [place] [facility];  Service: Cardiovascular;  Laterality: N/A;  . SHOULDER SURGERY Right   . TEE WITHOUT CARDIOVERSION N/A 11/29/2017   Procedure: TRANSESOPHAGEAL ECHOCARDIOGRAM (TEE);  Surgeon: [name], Collier Salina, MD;  Location: [place];  Service: Open Heart Surgery;  Laterality: N/A;  . TEE WITHOUT CARDIOVERSION N/A 06/28/2018   Procedure: TRANSESOPHAGEAL ECHOCARDIOGRAM (TEE);  Surgeon: [name], MD;  [facility]: [facility];  Service: Cardiovascular;  Laterality: N/A;  . TOE AMPUTATION Left 11/28/15   4th toe    Current Outpatient Medications on File Prior to Visit   Medication Sig Dispense Refill  . aspirin EC 325 MG EC tablet Take 1 tablet (325 mg total) by mouth daily. (Patient taking differently: Take 325 mg by mouth at bedtime. ) 30 tablet 0  . Blood Glucose Monitoring Suppl (ONETOUCH VERIO) w/Device KIT 1 application by Does not apply route 4 (four) times daily. 1 kit 0  . clopidogrel (PLAVIX) 75 MG tablet TAKE 1 TABLET(75 MG) BY MOUTH DAILY 90 tablet 1  . furosemide (LASIX) 20 MG tablet TAKE 1 TABLET(20 MG) BY MOUTH DAILY 90 tablet 0  . glucose blood (ONETOUCH VERIO) test strip 1 each by Other route as needed for other. Use as instructed 100 each 12  . insulin detemir (LEVEMIR) 100 UNIT/ML injection Inject 0.2 mLs (20 Units total) into the skin at bedtime. 10 mL 2  . Insulin Pen Needle 31G X 5 MM MISC Use as directed to inject insulin. Dx Code:e11.43 30 each 12  . lansoprazole (PREVACID) 30 MG capsule Take 1 capsule (30 mg total) by mouth daily at 12 noon. 90 capsule 3  . metFORMIN (GLUCOPHAGE XR) 750 MG 24 hr tablet Take 1 tablet (750 mg  total) by mouth 2 (two) times daily. 60 tablet 2  . metoCLOPramide (REGLAN) 10 MG tablet Take 1 tablet (10 mg total) by mouth every 6 (six) hours as needed for nausea. 60 tablet 1  . metoprolol succinate (TOPROL-XL) 25 MG 24 hr tablet Take 0.5 tablets (12.5 mg total) by mouth daily. 15 tablet 0  . ONETOUCH DELICA LANCETS 27X MISC 1 application by Does not apply route 4 (four) times daily. 100 each 12  . rosuvastatin (CRESTOR) 40 MG tablet TAKE 1 TABLET(40 MG) BY MOUTH DAILY 90 tablet 1   No current facility-administered medications on file prior to visit.    No Known Allergies  Family History  Problem Relation Age of Onset  . Cancer Paternal Grandmother   . COPD Mother   . Heart failure Mother   . Anxiety disorder Mother   . Depression Mother   . Fibromyalgia Mother   . Esophageal cancer Father        Smoker, still does  . Diabetes Sister   . Fibromyalgia Sister   . Diabetes Maternal Uncle   . Diabetes  Maternal Grandmother   . GER disease Sister   . Liver disease Paternal Grandfather   . Hemachromatosis Paternal Grandfather   . Diabetes Maternal Grandfather     Social History   Socioeconomic History  . Marital status: Divorced    Spouse name: Not on file  . Number of children: 3  . Years of education: HS  . Highest education level: Not on file  Occupational History  . Occupation: Disabled  Tobacco Use  . Smoking status: Never Smoker  . Smokeless tobacco: Never Used  Vaping Use  . Vaping Use: Never used  Substance and Sexual Activity  . Alcohol use: No  . Drug use: No  . Sexual activity: Yes  Other Topics Concern  . Not on file  Social History Narrative   Lives at home with his children.   Right-handed.   No caffeine use.   Social Determinants of Health   Financial Resource Strain:   . Difficulty of Paying Living Expenses:   Food Insecurity:   . Worried About Charity fundraiser in the Last Year:   . Arboriculturist in the Last Year:   Transportation Needs:   . Film/video editor (Medical):   Marland Kitchen Lack of Transportation (Non-Medical):   Physical Activity:   . Days of Exercise per Week:   . Minutes of Exercise per Session:   Stress:   . Feeling of Stress :   Social Connections:   . Frequency of Communication with Friends and Family:   . Frequency of Social Gatherings with Friends and Family:   . Attends Religious Services:   . Active Member of Clubs or Organizations:   . Attends Archivist Meetings:   Marland Kitchen Marital Status:   Intimate Partner Violence:   . Fear of Current or Ex-Partner:   . Emotionally Abused:   Marland Kitchen Physically Abused:   . Sexually Abused:    ROS Pertinent ROS are listed in the HPI.  Resp 16   Ht 6' (1.829 m)   Wt 244 lb (110.7 kg)   BMI 33.09 kg/m   Physical Exam Vitals reviewed.  Constitutional:      Appearance: Normal appearance.  HENT:     Head: Normocephalic and atraumatic.     Mouth/Throat:     Mouth: Mucous  membranes are moist.  Eyes:     Conjunctiva/sclera: Conjunctivae normal.  Pupils: Pupils are equal, round, and reactive to light.  Cardiovascular:     Rate and Rhythm: Normal rate and regular rhythm.     Pulses: Normal pulses.     Heart sounds: Normal heart sounds.  Pulmonary:     Effort: Pulmonary effort is normal.     Breath sounds: Normal breath sounds.  Abdominal:     General: There is no distension.     Palpations: Abdomen is soft. There is no mass.     Tenderness: There is abdominal tenderness (LUQ).     Hernia: No hernia is present.  Neurological:     General: No focal deficit present.     Mental Status: He is alert and oriented to person, place, and time.  Psychiatric:        Mood and Affect: Mood normal.     Recent Results (from the past 2160 hour(s))  CUP PACEART REMOTE DEVICE CHECK     Status: None   Collection Time: 05/10/20 11:34 PM  Result Value Ref Range   Date Time Interrogation Session [id number]    Pulse Generator Manufacturer MERM    Pulse Gen Model LNQ11 Reveal LINQ    Pulse Gen Serial Number [id number]    Clinic Name [facility]    Implantable Pulse Generator Type ICM/ILR    Implantable Pulse Generator Implant Date 60630160   CUP PACEART REMOTE DEVICE CHECK     Status: None   Collection Time: 06/12/20 11:03 PM  Result Value Ref Range   Date Time Interrogation Session [id number]    Pulse Generator Manufacturer MERM    Pulse Gen Model FUX32 Reveal LINQ    Pulse Gen Serial Number [id number]    Clinic Name [facility]    Implantable Pulse Generator Type ICM/ILR    Implantable Pulse Generator Implant Date [id number]   CUP PACEART REMOTE DEVICE CHECK     Status: None   Collection Time: 07/15/20 11:16 PM  Result Value Ref Range   Date Time Interrogation Session [id number]    Pulse Generator Manufacturer MERM    Pulse Gen Model PXT06 Reveal LINQ    Pulse Gen Serial Number [id number]    Clinic Name [facility]    Implantable Pulse  Generator Type ICM/ILR    Implantable Pulse Generator Implant Date [id number]    Eval Rhythm SR     Assessment/Plan: 1. 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  Eye exam up-to-date. Records requested. Pneumonia vaccines up-to-date. Foot exam up-to-date. Will repeat fasting labs as noted below. Will alter regimen accordingly. Will plan on follow-up every 3 months for now.  - Comprehensive metabolic panel - Hemoglobin A1c - Urine Microalbumin w/creat. ratio - Urinalysis, Routine w reflex microscopic  2. Diabetic peripheral neuropathy associated with type 2 diabetes mellitus (HCC) Stable. No changtes today.  3. Ischemic cardiomyopathy Followed by Cardiology. Continue management per specialist.  4. Fatigue, unspecified type Mentioned at end of visit, in the setting of multiple chronic conditions. Will check lab panel today - CBC with Differential/Platelet - Comprehensive metabolic panel - Hemoglobin A1c - Vitamin D (25 hydroxy) - TSH - B12  5. Gastroesophageal reflux disease without esophagitis Stop Prevacid. Start Protonix. OTC medications reviewed. Start GERD diet. GI follow-up already scheduled.   6. Gastroparesis due to DM (HCC) Stable. Continue management per GI.   7. Renal lesion Noted on CT 10/29/2018. Quite overdue for recommended follow-up. Will proceed with CT w w/o contrast.  -  CT RENAL ABD W/WO; Future    This visit occurred during the SARS-CoV-2 public health emergency.  Safety protocols were in place, including screening questions prior to the visit, additional usage of staff PPE, and extensive cleaning of exam room while observing appropriate contact time as indicated for disinfecting solutions.     Leeanne Rio, PA-C

## 2020-07-22 NOTE — Progress Notes (Signed)
[facility] Summary Report / Loop Recorder 

## 2020-07-22 NOTE — Patient Instructions (Signed)
Please go to the lab today for blood work.  I will call you with your results. We will alter treatment regimen(s) if indicated by your results.   Please continue chronic medication for now with the following exception: stop the prevacid and start the protonix daily as directed. I was you to call your Gastroenterologist as well to schedule a follow-up.  Keep hydrated. As reflux symptoms improve you should have a better time with foods and medication.    It was very nice meeting you today. Welcome to [employer]!   Food Choices for Gastroesophageal Reflux Disease, Adult When you have gastroesophageal reflux disease (GERD), the foods you eat and your eating habits are very important. Choosing the right foods can help ease your discomfort. Think about working with a nutrition specialist (dietitian) to help you make good choices. What are tips for following this plan?  Meals  Choose healthy foods that are low in fat, such as fruits, vegetables, whole grains, low-fat dairy products, and lean meat, fish, and poultry.  Eat small meals often instead of 3 large meals a day. Eat your meals slowly, and in a place where you are relaxed. Avoid bending over or lying down until 2-3 hours after eating.  Avoid eating meals 2-3 hours before bed.  Avoid drinking a lot of liquid with meals.  Cook foods using methods other than frying. Bake, grill, or broil food instead.  Avoid or limit: ? Chocolate. ? Peppermint or spearmint. ? Alcohol. ? Pepper. ? Black and decaffeinated coffee. ? Black and decaffeinated tea. ? Bubbly (carbonated) soft drinks. ? Caffeinated energy drinks and soft drinks.  Limit high-fat foods such as: ? Fatty meat or fried foods. ? Whole milk, cream, butter, or ice cream. ? Nuts and nut butters. ? Pastries, donuts, and sweets made with butter or shortening.  Avoid foods that cause symptoms. These foods may be different for everyone. Common foods that cause symptoms  include: ? Tomatoes. ? Oranges, lemons, and limes. ? Peppers. ? Spicy food. ? Onions and garlic. ? Vinegar. Lifestyle  Maintain a healthy weight. Ask your doctor what weight is healthy for you. If you need to lose weight, work with your doctor to do so safely.  Exercise for at least 30 minutes for 5 or more days each week, or as told by your doctor.  Wear loose-fitting clothes.  Do not smoke. If you need help quitting, ask your doctor.  Sleep with the head of your bed higher than your feet. Use a wedge under the mattress or blocks under the bed frame to raise the head of the bed. Summary  When you have gastroesophageal reflux disease (GERD), food and lifestyle choices are very important in easing your symptoms.  Eat small meals often instead of 3 large meals a day. Eat your meals slowly, and in a place where you are relaxed.  Limit high-fat foods such as fatty meat or fried foods.  Avoid bending over or lying down until 2-3 hours after eating.  Avoid peppermint and spearmint, caffeine, alcohol, and chocolate. This information is not intended to replace advice given to you by your health care provider. Make sure you discuss any questions you have with your health care provider. Document Revised: 03/22/2019 Document Reviewed: 01/04/2017 Elsevier Patient Education  2020 [employer].

## 2020-07-23 LAB — MICROALBUMIN / CREATININE URINE RATIO
Creatinine,U: 48.7 mg/dL
Microalb Creat Ratio: 4 mg/g (ref 0.0–30.0)
Microalb, Ur: 2 mg/dL — ABNORMAL HIGH (ref 0.0–1.9)

## 2020-07-23 LAB — CBC WITH DIFFERENTIAL/PLATELET
Basophils Absolute: 0.1 10*3/uL (ref 0.0–0.1)
Basophils Relative: 0.9 % (ref 0.0–3.0)
Eosinophils Absolute: 0.3 10*3/uL (ref 0.0–0.7)
Eosinophils Relative: 3.3 % (ref 0.0–5.0)
HCT: 40.8 % (ref 39.0–52.0)
Hemoglobin: 13.9 g/dL (ref 13.0–17.0)
Lymphocytes Relative: 21.3 % (ref 12.0–46.0)
Lymphs Abs: 1.9 10*3/uL (ref 0.7–4.0)
MCHC: 34.1 g/dL (ref 30.0–36.0)
MCV: 88.6 fl (ref 78.0–100.0)
Monocytes Absolute: 0.5 10*3/uL (ref 0.1–1.0)
Monocytes Relative: 5.7 % (ref 3.0–12.0)
Neutro Abs: 6.3 10*3/uL (ref 1.4–7.7)
Neutrophils Relative %: 68.8 % (ref 43.0–77.0)
Platelets: 260 10*3/uL (ref 150.0–400.0)
RBC: 4.6 Mil/uL (ref 4.22–5.81)
RDW: 13.5 % (ref 11.5–15.5)
WBC: 9.1 10*3/uL (ref 4.0–10.5)

## 2020-07-23 LAB — HEMOGLOBIN A1C: Hgb A1c MFr Bld: 9.8 % — ABNORMAL HIGH (ref 4.6–6.5)

## 2020-07-23 LAB — URINALYSIS, ROUTINE W REFLEX MICROSCOPIC
Bilirubin Urine: NEGATIVE
Ketones, ur: NEGATIVE
Leukocytes,Ua: NEGATIVE
Nitrite: NEGATIVE
RBC / HPF: NONE SEEN (ref 0–?)
Specific Gravity, Urine: 1.015 (ref 1.000–1.030)
Total Protein, Urine: NEGATIVE
Urine Glucose: >=1000 — AB
Urobilinogen, UA: 0.2 (ref 0.0–1.0)
WBC, UA: NONE SEEN (ref 0–?)
pH: 5.5 (ref 5.0–8.0)

## 2020-07-23 LAB — COMPREHENSIVE METABOLIC PANEL
ALT: 17 U/L (ref 0–53)
AST: 15 U/L (ref 0–37)
Albumin: 4.2 g/dL (ref 3.5–5.2)
Alkaline Phosphatase: 60 U/L (ref 39–117)
BUN: 15 mg/dL (ref 6–23)
CO2: 22 mEq/L (ref 19–32)
Calcium: 9.4 mg/dL (ref 8.4–10.5)
Chloride: 98 mEq/L (ref 96–112)
Creatinine, Ser: 1.06 mg/dL (ref 0.40–1.50)
GFR: 74.22 mL/min (ref >60.00)
Glucose, Bld: 381 mg/dL — ABNORMAL HIGH (ref 70–99)
Potassium: 4.8 mEq/L (ref 3.5–5.1)
Sodium: 133 mEq/L — ABNORMAL LOW (ref 135–145)
Total Bilirubin: 0.8 mg/dL (ref 0.2–1.2)
Total Protein: 6.7 g/dL (ref 6.0–8.3)

## 2020-07-23 LAB — VITAMIN D 25 HYDROXY (VIT D DEFICIENCY, FRACTURES): VITD: 7.85 ng/mL — ABNORMAL LOW (ref 30.00–100.00)

## 2020-07-23 LAB — TSH: TSH: 2.63 u[IU]/mL (ref 0.35–4.50)

## 2020-07-23 LAB — VITAMIN B12: Vitamin B-12: 123 pg/mL — ABNORMAL LOW (ref 211–911)

## 2020-07-25 ENCOUNTER — Other Ambulatory Visit: Payer: Self-pay

## 2020-07-25 ENCOUNTER — Other Ambulatory Visit: Payer: Self-pay | Admitting: General Practice

## 2020-07-25 ENCOUNTER — Encounter: Payer: Self-pay | Admitting: Physician Assistant

## 2020-07-25 DIAGNOSIS — Z794 Long term (current) use of insulin: Secondary | ICD-10-CM

## 2020-07-25 DIAGNOSIS — E1142 Type 2 diabetes mellitus with diabetic polyneuropathy: Secondary | ICD-10-CM

## 2020-07-25 MED ORDER — RAMIPRIL 2.5 MG PO CAPS
2.5000 mg | ORAL_CAPSULE | Freq: Every day | ORAL | 3 refills | Status: DC
Start: 2020-07-25 — End: 2021-02-02

## 2020-07-25 MED ORDER — METFORMIN HCL ER 750 MG PO TB24: 750.0000 mg | ORAL_TABLET | Freq: Two times a day (BID) | ORAL | 2 refills | Status: DC

## 2020-07-25 MED ORDER — VITAMIN D (ERGOCALCIFEROL) 1.25 MG (50000 UNIT) PO CAPS
50000.0000 [IU] | ORAL_CAPSULE | ORAL | 0 refills | Status: DC
Start: 2020-07-25 — End: 2024-01-02

## 2020-07-25 MED ORDER — CLOPIDOGREL BISULFATE 75 MG PO TABS: ORAL_TABLET | ORAL | 1 refills | Status: DC

## 2020-07-25 MED ORDER — INSULIN DETEMIR 100 UNIT/ML ~~LOC~~ SOLN: 20.0000 [IU] | Freq: Every day | SUBCUTANEOUS | 2 refills | Status: DC

## 2020-07-28 ENCOUNTER — Other Ambulatory Visit: Payer: Self-pay | Admitting: Cardiovascular Disease

## 2020-07-28 MED ORDER — METOPROLOL SUCCINATE ER 25 MG PO TB24: 12.5000 mg | ORAL_TABLET | Freq: Every day | ORAL | 0 refills | Status: DC

## 2020-07-30 ENCOUNTER — Other Ambulatory Visit: Payer: Self-pay | Admitting: General Practice

## 2020-07-31 ENCOUNTER — Telehealth: Payer: Self-pay | Admitting: Physician Assistant

## 2020-07-31 ENCOUNTER — Other Ambulatory Visit: Payer: Self-pay

## 2020-07-31 DIAGNOSIS — E1142 Type 2 diabetes mellitus with diabetic polyneuropathy: Secondary | ICD-10-CM

## 2020-07-31 MED ORDER — INSULIN DETEMIR 100 UNIT/ML ~~LOC~~ SOLN: 20.0000 [IU] | Freq: Every day | SUBCUTANEOUS | 2 refills | Status: DC

## 2020-07-31 NOTE — Telephone Encounter (Signed)
Patient states pharmacy does not have prescription. Called pharmacy twice and not able to get through. Refill of Levemir sent to patient's [facility] pharmacy in [place].

## 2020-07-31 NOTE — Telephone Encounter (Signed)
Pt called in stating that needs the Levermir pens sent into [facility] [place]. Pt can be reached at the home #

## 2020-07-31 NOTE — Telephone Encounter (Signed)
Medications were sent in on the 13th including Levemir. Not sure why he was not able to pick up. Ok to resend.

## 2020-07-31 NOTE — Telephone Encounter (Signed)
Patient is aware 

## 2020-07-31 NOTE — Telephone Encounter (Signed)
Pt called back following up on prescription. Pt states he needs it tonight.

## 2020-08-01 ENCOUNTER — Other Ambulatory Visit: Payer: Self-pay

## 2020-08-01 MED ORDER — INSULIN DETEMIR 100 UNIT/ML FLEXPEN: 20.0000 [IU] | PEN_INJECTOR | Freq: Every day | SUBCUTANEOUS | 11 refills | Status: DC

## 2020-08-01 NOTE — Telephone Encounter (Signed)
Levemir pen script sent to pharmacy

## 2020-08-01 NOTE — Telephone Encounter (Signed)
Pt can't the Levemir vails it has to the pens, please advise if this can be changed and sent in for the pt.

## 2020-08-04 DIAGNOSIS — K219 Gastro-esophageal reflux disease without esophagitis: Secondary | ICD-10-CM | POA: Insufficient documentation

## 2020-08-04 DIAGNOSIS — R5383 Other fatigue: Secondary | ICD-10-CM | POA: Insufficient documentation

## 2020-08-06 ENCOUNTER — Ambulatory Visit: Payer: Medicare Other | Admitting: Physician Assistant

## 2020-08-20 LAB — CUP PACEART REMOTE DEVICE CHECK
Date Time Interrogation Session: 20210905233516
Implantable Pulse Generator Implant Date: 20190717

## 2020-08-25 ENCOUNTER — Ambulatory Visit (INDEPENDENT_AMBULATORY_CARE_PROVIDER_SITE_OTHER): Payer: Medicare Other | Admitting: *Deleted

## 2020-08-25 ENCOUNTER — Other Ambulatory Visit: Payer: Self-pay | Admitting: Family Medicine

## 2020-08-25 DIAGNOSIS — I639 Cerebral infarction, unspecified: Secondary | ICD-10-CM

## 2020-08-26 ENCOUNTER — Ambulatory Visit: Payer: Medicare Other | Admitting: Physician Assistant

## 2020-08-27 NOTE — Progress Notes (Signed)
[facility] Summary Report / Loop Recorder 

## 2020-08-28 ENCOUNTER — Encounter: Payer: Self-pay | Admitting: Physician Assistant

## 2020-09-04 ENCOUNTER — Other Ambulatory Visit: Payer: Self-pay

## 2020-09-04 NOTE — Telephone Encounter (Signed)
Metoclopramide lFD 03/22/20 #60 with 1 refill LOV 07/22/20 NOV none

## 2020-09-10 ENCOUNTER — Ambulatory Visit
Admission: RE | Admit: 2020-09-10 | Discharge: 2020-09-10 | Disposition: A | Discharge status: Home / Self Care | Payer: Medicare Other | Source: Ambulatory Visit | Attending: Physician Assistant | Admitting: Physician Assistant

## 2020-09-10 ENCOUNTER — Other Ambulatory Visit: Payer: Self-pay

## 2020-09-10 DIAGNOSIS — N289 Disorder of kidney and ureter, unspecified: Secondary | ICD-10-CM

## 2020-09-10 MED ORDER — IOPAMIDOL (ISOVUE-300) INJECTION 61%
100.0000 mL | Freq: Once | INTRAVENOUS | Status: AC | PRN
  Administered 2020-09-10: 100 mL via INTRAVENOUS

## 2020-09-17 ENCOUNTER — Other Ambulatory Visit: Payer: Self-pay | Admitting: Emergency Medicine

## 2020-09-17 DIAGNOSIS — K3184 Gastroparesis: Secondary | ICD-10-CM

## 2020-09-17 DIAGNOSIS — E1143 Type 2 diabetes mellitus with diabetic autonomic (poly)neuropathy: Secondary | ICD-10-CM

## 2020-09-17 MED ORDER — METOCLOPRAMIDE HCL 10 MG PO TABS
10.0000 mg | ORAL_TABLET | Freq: Three times a day (TID) | ORAL | 0 refills | Status: AC | PRN
Start: 2020-09-17 — End: ?

## 2020-09-19 ENCOUNTER — Ambulatory Visit: Payer: Medicare Other | Admitting: Physician Assistant

## 2020-09-20 LAB — CUP PACEART REMOTE DEVICE CHECK
Date Time Interrogation Session: 20211008233800
Implantable Pulse Generator Implant Date: 20190717

## 2020-09-24 ENCOUNTER — Other Ambulatory Visit: Payer: Self-pay

## 2020-09-24 ENCOUNTER — Encounter: Payer: Self-pay | Admitting: Endocrinology

## 2020-09-24 ENCOUNTER — Ambulatory Visit (INDEPENDENT_AMBULATORY_CARE_PROVIDER_SITE_OTHER): Payer: Medicare Other | Admitting: Endocrinology

## 2020-09-24 VITALS — BP 124/78 | HR 74 | Ht 72.0 in | Wt 235.0 lb

## 2020-09-24 DIAGNOSIS — Z794 Long term (current) use of insulin: Secondary | ICD-10-CM | POA: Diagnosis not present

## 2020-09-24 DIAGNOSIS — E1143 Type 2 diabetes mellitus with diabetic autonomic (poly)neuropathy: Secondary | ICD-10-CM

## 2020-09-24 LAB — POCT GLYCOSYLATED HEMOGLOBIN (HGB A1C): Hemoglobin A1C: 9 % — AB (ref 4.0–5.6)

## 2020-09-24 MED ORDER — INSULIN DETEMIR 100 UNIT/ML FLEXPEN
25.0000 [IU] | PEN_INJECTOR | SUBCUTANEOUS | 11 refills | Status: DC
Start: 2020-09-24 — End: 2020-11-12

## 2020-09-24 NOTE — Progress Notes (Signed)
Subjective:    Patient ID: Travis [name], male    DOB: January 18, 1971, 49 y.o.   MRN: [id number]  HPI pt is referred by Raiford Noble, PA, for diabetes.  Pt states DM was dx'ed in 2001; he is unaware of any chronic complications  DR, CAD, PN, GP, DN, and Left BKA; he has been on insulin 2015; pt says his diet and exercise are not good; he has never had pancreatitis, pancreatic surgery, or severe hypoglycemia.  He had DKA in 2021.  He has not recently checked cbg, but he has a meter and strips.  He wants to continue metformin, as he feels it is helping his A1c.  He cannot use syringe and vial, due to PN of the hands.     Past Medical History:  Diagnosis Date  . Acute on chronic systolic and diastolic heart failure, NYHA class 3 ([place]) 09/14/2017  . Acute osteomyelitis of metatarsal bone of left foot ([place]) 11/12/2015  . CAD in native artery 09/14/2017  . Closed fracture of right tibial plateau 11/11/2015  . Coronary artery disease   . Dehiscence of amputation stump (H[facility])    left leg  . Depression with anxiety   . Diabetes mellitus    INSULIN DEPENDENT Type II  . Diabetic peripheral neuropathy associated with type 2 diabetes mellitus ([place]) 08/30/2008   Qualifier: Diagnosis of  By: [name] Done FNP, Tori Milks    . Diabetic ulcer of left foot ([place]) 11/14/2015  . Gastroparesis   . GERD (gastroesophageal reflux disease)   . History of loop recorder 06/2018   Last remote device check was on 02/14/2019  . Hypertension    patient denies  . Left ventricular aneurysm    REPORTS HE IS NOT AWAREW OF THIS   . Myocardial infarction ([place])   . Osteomyelitis ([place])   . Osteoporosis 11/12/2015  . Peripheral neuropathy    feet and below bilateral knees  . Peripheral vascular disease (H[facility])    PAD in Left leg and some in right leg  . Shortness of breath dyspnea   . Stroke ([place]) 05/2018  . Vitamin D deficiency 11/12/2015    Past Surgical History:  Procedure Laterality Date  . AMPUTATION Left 11/28/2015    Procedure: Left 4th Ray Amputation;  Surgeon: [name], MD;  Location: [facility];  Service: Orthopedics;  Laterality: Left;  . AMPUTATION Left 10/29/2016   Procedure: LEFT 5TH RAY AMPUTATION;  Surgeon: [name], MD;  Location: [facility];  Service: Orthopedics;  Laterality: Left;  . AMPUTATION Left 03/09/2019   Procedure: LEFT FOOT 5TH RAY AMPUTATION;  Surgeon: [name], MD;  Location: [facility];  Service: Orthopedics;  Laterality: Left;  . AMPUTATION Left 04/18/2019   Procedure: LEFT BELOW KNEE AMPUTATION;  Surgeon: [name], MD;  Location: [facility];  Service: Orthopedics;  Laterality: Left;  . ANTERIOR VITRECTOMY Left 04/12/2016   Procedure: ANTERIOR CHAMBER [place] OUT WITH GAS FLUID EXCHANGE;  Surgeon: [name], MD;  Location: [facility];  Service: Ophthalmology;  Laterality: Left;  . CARDIAC CATHETERIZATION  10/2017  . CATARACT EXTRACTION Left   . CHOLECYSTECTOMY N/A 01/04/2019   Procedure: LAPAROSCOPIC CHOLECYSTECTOMY;  Surgeon: [name], MD;  Location: [facility];  Service: General;  Laterality: N/A;  . CORONARY ARTERY BYPASS GRAFT N/A 11/29/2017    LIMA-LAD, SVG-PDA, SVG-CFX  Procedure: CORONARY ARTERY BYPASS GRAFTING (CABG) times three using the left saphaneous vien. harvested endoscopicly and left internal mammary artery.;  Surgeon: [name], MD;  Location: [facility];  Service: Open Heart Surgery;  Laterality: N/A;  . EYE SURGERY     left eye surgery 04/2016  . IR CHOLANGIOGRAM EXISTING TUBE  10/17/2018  . IR CHOLANGIOGRAM EXISTING TUBE  10/30/2018  . IR PERC CHOLECYSTOSTOMY  09/05/2018  . KNEE SURGERY Left   . LOOP RECORDER INSERTION N/A 06/28/2018   Procedure: LOOP RECORDER INSERTION;  Surgeon: Deboraha Sprang, MD;  Location: [facility];  Service: Cardiovascular;  Laterality: N/A;  . RIGHT/LEFT HEART CATH AND CORONARY ANGIOGRAPHY N/A 10/19/2017   Procedure: RIGHT/LEFT HEART CATH AND CORONARY ANGIOGRAPHY;  Surgeon: [name], [name], MD;  Location: [facility];  Service:  Cardiovascular;  Laterality: N/A;  . SHOULDER SURGERY Right   . TEE WITHOUT CARDIOVERSION N/A 11/29/2017   Procedure: TRANSESOPHAGEAL ECHOCARDIOGRAM (TEE);  Surgeon: [name], Collier Salina, MD;  Location: [facility];  Service: Open Heart Surgery;  Laterality: N/A;  . TEE WITHOUT CARDIOVERSION N/A 06/28/2018   Procedure: TRANSESOPHAGEAL ECHOCARDIOGRAM (TEE);  Surgeon: [name], MD;  Location: [facility];  Service: Cardiovascular;  Laterality: N/A;  . TOE AMPUTATION Left 11/28/15   4th toe    Social History   Socioeconomic History  . Marital status: Divorced    Spouse name: Not on file  . Number of children: 3  . Years of education: HS  . Highest education level: Not on file  Occupational History  . Occupation: Disabled  Tobacco Use  . Smoking status: Never Smoker  . Smokeless tobacco: Never Used  Vaping Use  . Vaping Use: Never used  Substance and Sexual Activity  . Alcohol use: No  . Drug use: No  . Sexual activity: Yes  Other Topics Concern  . Not on file  Social History Narrative   Lives at home with his children.   Right-handed.   No caffeine use.   Social Determinants of Health   Financial Resource Strain:   . Difficulty of Paying Living Expenses: Not on file  Food Insecurity:   . Worried About Charity fundraiser in the Last Year: Not on file  . Ran Out of Food in the Last Year: Not on file  Transportation Needs:   . Lack of Transportation (Medical): Not on file  . Lack of Transportation (Non-Medical): Not on file  Physical Activity:   . Days of Exercise per Week: Not on file  . Minutes of Exercise per Session: Not on file  Stress:   . Feeling of Stress : Not on file  Social Connections:   . Frequency of Communication with Friends and Family: Not on file  . Frequency of Social Gatherings with Friends and Family: Not on file  . Attends Religious Services: Not on file  . Active Member of Clubs or Organizations: Not on file  . Attends Archivist  Meetings: Not on file  . Marital Status: Not on file  Intimate Partner Violence:   . Fear of Current or Ex-Partner: Not on file  . Emotionally Abused: Not on file  . Physically Abused: Not on file  . Sexually Abused: Not on file    Current Outpatient Medications on File Prior to Visit  Medication Sig Dispense Refill  . aspirin EC 325 MG EC tablet Take 1 tablet (325 mg total) by mouth daily. (Patient taking differently: Take 325 mg by mouth at bedtime. ) 30 tablet 0  . Blood Glucose Monitoring Suppl (ONETOUCH VERIO) w/Device KIT 1 application by Does not apply route 4 (four) times  daily. 1 kit 0  . clopidogrel (PLAVIX) 75 MG tablet TAKE 1 TABLET(75 MG) BY MOUTH DAILY 90 tablet 1  . furosemide (LASIX) 20 MG tablet TAKE 1 TABLET(20 MG) BY MOUTH DAILY 90 tablet 0  . glucose blood (ONETOUCH VERIO) test strip 1 each by Other route as needed for other. Use as instructed 100 each 12  . Insulin Pen Needle 31G X 5 MM MISC Use as directed to inject insulin. Dx Code:e11.43 30 each 12  . metFORMIN (GLUCOPHAGE XR) 750 MG 24 hr tablet Take 1 tablet (750 mg total) by mouth 2 (two) times daily. 60 tablet 2  . metoCLOPramide (REGLAN) 10 MG tablet Take 1 tablet (10 mg total) by mouth every 8 (eight) hours as needed for nausea. 30 tablet 0  . metoprolol succinate (TOPROL-XL) 25 MG 24 hr tablet TAKE 1/2 TABLET BY MOUTH EVERY DAY 45 tablet 1  . ONETOUCH DELICA LANCETS 08M MISC 1 application by Does not apply route 4 (four) times daily. 100 each 12  . pantoprazole (PROTONIX) 40 MG tablet Take 1 tablet (40 mg total) by mouth daily. 30 tablet 3  . ramipril (ALTACE) 2.5 MG capsule Take 1 capsule (2.5 mg total) by mouth daily. 30 capsule 3  . rosuvastatin (CRESTOR) 40 MG tablet TAKE 1 TABLET(40 MG) BY MOUTH DAILY 90 tablet 1  . Vitamin D, Ergocalciferol, (DRISDOL) 1.25 MG (50000 UNIT) CAPS capsule Take 1 capsule (50,000 Units total) by mouth every 7 (seven) days. 12 capsule 0   No current facility-administered  medications on file prior to visit.    No Known Allergies  Family History  Problem Relation Age of Onset  . Cancer Paternal Grandmother   . COPD Mother   . Heart failure Mother   . Anxiety disorder Mother   . Depression Mother   . Fibromyalgia Mother   . Esophageal cancer Father        Smoker, still does  . Diabetes Sister   . Fibromyalgia Sister   . Diabetes Maternal Uncle   . Diabetes Maternal Grandmother   . GER disease Sister   . Liver disease Paternal Grandfather   . Hemachromatosis Paternal Grandfather   . Diabetes Maternal Grandfather     BP 124/78   Pulse 74   Ht 6' (1.829 m)   Wt 235 lb (106.6 kg)   SpO2 99%   BMI 31.87 kg/m     Review of Systems denies weight loss, chest pain, sob, urinary frequency, hypoglycemia, and memory loss.  No change in chronic depression.  He has fatigue and chronic nausea.      Objective:   Physical Exam VITAL SIGNS:  See vs page GENERAL: no distress Pulses: right dorsalis pedis intact.   MSK: no deformity of the right foot. Left BKA CV: no right leg leg edema Skin:  no ulcer on the feet.  normal color and temp on the right foot Neuro: sensation is absent to touch on the right foot    Lab Results  Component Value Date   CREATININE 1.06 07/22/2020   BUN 15 07/22/2020   NA 133 (L) 07/22/2020   K 4.8 07/22/2020   CL 98 07/22/2020   CO2 22 07/22/2020    A1c=9.0%  I have reviewed outside records, and summarized: Pt was noted to have elevated A1c, and referred here.  PN and GERD were also addressed.  Main symptom was fatigue      Assessment & Plan:  Type 1 DM, new to me: uncontrolled  Patient  Instructions  good diet and exercise significantly improve the control of your diabetes.  please let me know if you wish to be referred to a dietician.  high blood sugar is very risky to your health.  you should see an eye doctor and dentist every year.  It is very important to get all recommended vaccinations.  Controlling  your blood pressure and cholesterol drastically reduces the damage diabetes does to your body.  Those who smoke should quit.  Please discuss these with your doctor.  check your blood sugar twice a day.  vary the time of day when you check, between before the 3 meals, and at bedtime.  also check if you have symptoms of your blood sugar being too high or too low.  please keep a record of the readings and bring it to your next appointment here (or you can bring the meter itself).  You can write it on any piece of paper.  please call us sooner if your blood sugar goes below 70, or if you have a lot of readings over 200.  For now, please: Increase the Levemir to 25 units each morning, and: Please continue the same metformin. On this type of insulin schedule, you should eat meals on a regular schedule.  If a meal is missed or significantly delayed, your blood sugar could go low.   Please come back for a follow-up appointment in 6 weeks.

## 2020-09-24 NOTE — Patient Instructions (Addendum)
good diet and exercise significantly improve the control of your diabetes.  please let me know if you wish to be referred to a dietician.  high blood sugar is very risky to your health.  you should see an eye doctor and dentist every year.  It is very important to get all recommended vaccinations.  Controlling your blood pressure and cholesterol drastically reduces the damage diabetes does to your body.  Those who smoke should quit.  Please discuss these with your doctor.  check your blood sugar twice a day.  vary the time of day when you check, between before the 3 meals, and at bedtime.  also check if you have symptoms of your blood sugar being too high or too low.  please keep a record of the readings and bring it to your next appointment here (or you can bring the meter itself).  You can write it on any piece of paper.  please call us sooner if your blood sugar goes below 70, or if you have a lot of readings over 200.  For now, please: Increase the Levemir to 25 units each morning, and: Please continue the same metformin. On this type of insulin schedule, you should eat meals on a regular schedule.  If a meal is missed or significantly delayed, your blood sugar could go low.   Please come back for a follow-up appointment in 6 weeks.

## 2020-09-26 ENCOUNTER — Other Ambulatory Visit: Payer: Self-pay | Admitting: Cardiovascular Disease

## 2020-09-27 ENCOUNTER — Encounter (HOSPITAL_COMMUNITY): Payer: Self-pay

## 2020-09-27 ENCOUNTER — Emergency Department (HOSPITAL_COMMUNITY)
Admission: EM | Admit: 2020-09-27 | Discharge: 2020-09-27 | Disposition: A | Discharge status: Home / Self Care | Payer: Medicare Other | Attending: Emergency Medicine | Admitting: Emergency Medicine

## 2020-09-27 ENCOUNTER — Other Ambulatory Visit: Payer: Self-pay

## 2020-09-27 ENCOUNTER — Emergency Department (HOSPITAL_COMMUNITY): Payer: Medicare Other

## 2020-09-27 DIAGNOSIS — I5043 Acute on chronic combined systolic (congestive) and diastolic (congestive) heart failure: Secondary | ICD-10-CM | POA: Insufficient documentation

## 2020-09-27 DIAGNOSIS — R1032 Left lower quadrant pain: Secondary | ICD-10-CM | POA: Diagnosis present

## 2020-09-27 DIAGNOSIS — I2510 Atherosclerotic heart disease of native coronary artery without angina pectoris: Secondary | ICD-10-CM | POA: Diagnosis not present

## 2020-09-27 DIAGNOSIS — N201 Calculus of ureter: Secondary | ICD-10-CM | POA: Insufficient documentation

## 2020-09-27 DIAGNOSIS — Z79899 Other long term (current) drug therapy: Secondary | ICD-10-CM | POA: Insufficient documentation

## 2020-09-27 DIAGNOSIS — Z7982 Long term (current) use of aspirin: Secondary | ICD-10-CM | POA: Insufficient documentation

## 2020-09-27 DIAGNOSIS — Z794 Long term (current) use of insulin: Secondary | ICD-10-CM | POA: Diagnosis not present

## 2020-09-27 DIAGNOSIS — Z7984 Long term (current) use of oral hypoglycemic drugs: Secondary | ICD-10-CM | POA: Diagnosis not present

## 2020-09-27 DIAGNOSIS — I110 Hypertensive heart disease with heart failure: Secondary | ICD-10-CM | POA: Diagnosis not present

## 2020-09-27 DIAGNOSIS — E1140 Type 2 diabetes mellitus with diabetic neuropathy, unspecified: Secondary | ICD-10-CM | POA: Diagnosis not present

## 2020-09-27 DIAGNOSIS — Z951 Presence of aortocoronary bypass graft: Secondary | ICD-10-CM | POA: Diagnosis not present

## 2020-09-27 LAB — URINALYSIS, ROUTINE W REFLEX MICROSCOPIC
Bacteria, UA: NONE SEEN
Bilirubin Urine: NEGATIVE
Glucose, UA: >=500 mg/dL — AB
Ketones, ur: NEGATIVE mg/dL
Leukocytes,Ua: NEGATIVE
Nitrite: NEGATIVE
Protein, ur: 100 mg/dL — AB
RBC / HPF: >50 RBC/hpf — ABNORMAL HIGH (ref 0–5)
Specific Gravity, Urine: 1.022 (ref 1.005–1.030)
pH: 5 (ref 5.0–8.0)

## 2020-09-27 LAB — CBC WITH DIFFERENTIAL/PLATELET
Abs Immature Granulocytes: 0.08 10*3/uL — ABNORMAL HIGH (ref 0.00–0.07)
Basophils Absolute: 0.1 10*3/uL (ref 0.0–0.1)
Basophils Relative: 1 %
Eosinophils Absolute: 0.3 10*3/uL (ref 0.0–0.5)
Eosinophils Relative: 3 %
HCT: 37.5 % — ABNORMAL LOW (ref 39.0–52.0)
Hemoglobin: 13 g/dL (ref 13.0–17.0)
Immature Granulocytes: 1 %
Lymphocytes Relative: 11 %
Lymphs Abs: 1.5 10*3/uL (ref 0.7–4.0)
MCH: 30.6 pg (ref 26.0–34.0)
MCHC: 34.7 g/dL (ref 30.0–36.0)
MCV: 88.2 fL (ref 80.0–100.0)
Monocytes Absolute: 0.6 10*3/uL (ref 0.1–1.0)
Monocytes Relative: 5 %
Neutro Abs: 10.2 10*3/uL — ABNORMAL HIGH (ref 1.7–7.7)
Neutrophils Relative %: 79 %
Platelets: 258 10*3/uL (ref 150–400)
RBC: 4.25 MIL/uL (ref 4.22–5.81)
RDW: 12.9 % (ref 11.5–15.5)
WBC: 12.7 10*3/uL — ABNORMAL HIGH (ref 4.0–10.5)
nRBC: 0 % (ref 0.0–0.2)

## 2020-09-27 LAB — COMPREHENSIVE METABOLIC PANEL
ALT: 15 U/L (ref 0–44)
AST: 18 U/L (ref 15–41)
Albumin: 3.9 g/dL (ref 3.5–5.0)
Alkaline Phosphatase: 48 U/L (ref 38–126)
Anion gap: 11 (ref 5–15)
BUN: 21 mg/dL — ABNORMAL HIGH (ref 6–20)
CO2: 21 mmol/L — ABNORMAL LOW (ref 22–32)
Calcium: 9.1 mg/dL (ref 8.9–10.3)
Chloride: 100 mmol/L (ref 98–111)
Creatinine, Ser: 1.63 mg/dL — ABNORMAL HIGH (ref 0.61–1.24)
GFR, Estimated: 49 mL/min — ABNORMAL LOW (ref >60)
Glucose, Bld: 301 mg/dL — ABNORMAL HIGH (ref 70–99)
Potassium: 4.2 mmol/L (ref 3.5–5.1)
Sodium: 132 mmol/L — ABNORMAL LOW (ref 135–145)
Total Bilirubin: 0.8 mg/dL (ref 0.3–1.2)
Total Protein: 6.9 g/dL (ref 6.5–8.1)

## 2020-09-27 LAB — LIPASE, BLOOD: Lipase: 27 U/L (ref 11–51)

## 2020-09-27 MED ORDER — HYDROCODONE-ACETAMINOPHEN 5-325 MG PO TABS: 1.0000 | ORAL_TABLET | Freq: Four times a day (QID) | ORAL | 0 refills | Status: DC | PRN

## 2020-09-27 MED ORDER — MORPHINE SULFATE (PF) 4 MG/ML IV SOLN
4.0000 mg | Freq: Once | INTRAVENOUS | Status: AC
  Administered 2020-09-27: 4 mg via INTRAVENOUS
  Filled 2020-09-27: qty 1

## 2020-09-27 MED ORDER — IBUPROFEN 400 MG PO TABS: 400.0000 mg | ORAL_TABLET | Freq: Four times a day (QID) | ORAL | 0 refills | Status: DC

## 2020-09-27 MED ORDER — KETOROLAC TROMETHAMINE 30 MG/ML IJ SOLN
30.0000 mg | Freq: Once | INTRAMUSCULAR | Status: AC
  Administered 2020-09-27: 30 mg via INTRAVENOUS
  Filled 2020-09-27: qty 1

## 2020-09-27 MED ORDER — ONDANSETRON 4 MG PO TBDP: 4.0000 mg | ORAL_TABLET | Freq: Three times a day (TID) | ORAL | 0 refills | Status: DC | PRN

## 2020-09-27 MED ORDER — TAMSULOSIN HCL 0.4 MG PO CAPS: 0.4000 mg | ORAL_CAPSULE | Freq: Every day | ORAL | 0 refills | Status: AC

## 2020-09-27 MED ORDER — ONDANSETRON HCL 4 MG/2ML IJ SOLN
4.0000 mg | Freq: Once | INTRAMUSCULAR | Status: AC
  Administered 2020-09-27: 4 mg via INTRAVENOUS
  Filled 2020-09-27: qty 2

## 2020-09-27 NOTE — ED Triage Notes (Signed)
He c/o left flank pain radiating to left groin since yesterday evening. He is in no distress.

## 2020-09-27 NOTE — Discharge Instructions (Signed)
Please follow-up with your primary care provider regarding today's encounter.  You have been diagnosed with a obstructing kidney stone, near your bladder.  It should pass soon, but I have prescribed tamsulosin which you can take to help facilitate passage of stone.  Please read the attachment.  I would like for you to take ibuprofen for your pain symptoms.  I have also prescribed Flexeril which she can take for additional relief.  Continue with your Reglan at home for gastroparesis.  I have also prescribed you Zofran ODT which is a dissolvable antinausea medication.    If you develop any fevers or chills or have any new or worsening symptoms, please do not hesitate to return to the ED.  Otherwise, you can follow-up with your primary care provider.  Given your mild worsening in your renal function, you will need to have repeat labs drawn by her primary care provider to ensure correction of your mild acute kidney injury, likely in the setting of your obstructing stone.

## 2020-09-27 NOTE — ED Provider Notes (Signed)
[facility] DEPT Provider Note   CSN: [id number] Arrival date & time: 09/27/20  2229     History Chief Complaint  Patient presents with  . Flank Pain    Travis [name] is a 49 y.o. male with PMH of CAD, MI on Plavix, CHF, HTN, IDDM, left-sided BKA, and CVA who presents to the ED via EMS with complaints of 8 out of 10 left-sided flank/abdominal pain since last night.  On my examination, patient tells me that he received a CT renal with and without contrast last month for further evaluation of proteinuria and it had chronic perinephric stranding but no other acute or emergent pathology.  No evidence of stone.  However, he states that he had acute onset pain beginning at 10 PM last evening which has since intensified.  He also endorses "difficulty urinating" and feeling as though he needs to "push" x2 days.  He feels as though his left-sided flank discomfort radiates into LLQ and groin.  He denies any fevers or chills, chest pain or difficulty breathing, recent illness or infection, diminished appetite, nausea or vomiting, melena or hematochezia, change in bowel habits, or obvious hematuria.  Patient received fentanyl via EMS which helped alleviate his discomfort, but it has since returned.  Denies any history of substance use disorder.  HPI     Past Medical History:  Diagnosis Date  . Acute on chronic systolic and diastolic heart failure, NYHA class 3 ([place]) 09/14/2017  . Acute osteomyelitis of metatarsal bone of left foot ([place]) 11/12/2015  . CAD in native artery 09/14/2017  . Closed fracture of right tibial plateau 11/11/2015  . Coronary artery disease   . Dehiscence of amputation stump (HCC)    left leg  . Depression with anxiety   . Diabetes mellitus    INSULIN DEPENDENT Type II  . Diabetic peripheral neuropathy associated with type 2 diabetes mellitus ([place]) 08/30/2008   Qualifier: Diagnosis of  By: [name] Done FNP, Tori Milks    . Diabetic ulcer of left  foot ([place]) 11/14/2015  . Gastroparesis   . GERD (gastroesophageal reflux disease)   . History of loop recorder 06/2018   Last remote device check was on 02/14/2019  . Hypertension    patient denies  . Left ventricular aneurysm    REPORTS HE IS NOT AWAREW OF THIS   . Myocardial infarction ([place])   . Osteomyelitis ([place])   . Osteoporosis 11/12/2015  . Peripheral neuropathy    feet and below bilateral knees  . Peripheral vascular disease (HCC)    PAD in Left leg and some in right leg  . Shortness of breath dyspnea   . Stroke ([place]) 05/2018  . Vitamin D deficiency 11/12/2015    Patient Active Problem List   Diagnosis Date Noted  . Fatigue 08/04/2020  . Gastroesophageal reflux disease without esophagitis 08/04/2020  . Below-knee amputation of left lower extremity ([place]) 04/18/2019  . Dehiscence of incision, sequela   . H/O amputation of lesser toe, left ([place]) 04/04/2019  . Foreign body (FB) in soft tissue   . Pressure injury of left foot, stage 2 ([place]) 02/14/2019  . PVD (peripheral vascular disease) ([place]) 02/14/2019  . Snoring 02/14/2019  . S/P laparoscopic cholecystectomy 01/04/2019  . Intractable vomiting   . Sepsis ([place]) 10/29/2018  . Abnormal finding on imaging 10/29/2018  . Cholecystostomy care ([place]) 09/05/2018  . Essential hypertension 06/27/2018  . Hyperlipidemia 06/27/2018  . Diabetic retinopathy ([place]) 06/27/2018  . Leukocytosis 06/27/2018  . Ischemic stroke ([place]) -  mult small B infarcts s/p tPA, unknown embolic source 62/70/3500  . Left arm numbness 03/03/2018  . Strain of right trapezius muscle 03/03/2018  . Ischemic cardiomyopathy 12/16/2017  . Major depressive disorder, recurrent episode, mild ([place]) 09/16/2017  . Acute on chronic systolic and diastolic heart failure, NYHA class 3 ([place]) 09/14/2017  . CAD in native artery 09/14/2017  . S/P BKA (below knee amputation) unilateral, left ([place]) 11/19/2016  . Short Achilles tendon (acquired), left ankle 11/19/2016  .  Subacute osteomyelitis, left ankle and foot ([place]) 10/20/2016  . Gastroparesis due to DM ([place])   . History of diabetic ulcer of foot 11/14/2015  . Vitamin D deficiency 11/12/2015  . Osteoporosis 11/12/2015  . GERD (gastroesophageal reflux disease) 11/03/2015  . Erectile dysfunction 07/11/2009  . Allergic rhinitis 12/11/2008  . Diabetic peripheral neuropathy associated with type 2 diabetes mellitus ([place]) 08/30/2008  . Insomnia 08/24/2007  . Controlled type 2 diabetes mellitus with diabetic autonomic neuropathy, with long-term current use of insulin ([place]) 08/09/2007    Past Surgical History:  Procedure Laterality Date  . AMPUTATION Left 11/28/2015   Procedure: Left 4th Ray Amputation;  Surgeon: [name], MD;  Location: [facility];  Service: Orthopedics;  Laterality: Left;  . AMPUTATION Left 10/29/2016   Procedure: LEFT 5TH RAY AMPUTATION;  Surgeon: [name], MD;  Location: [place] [facility];  Service: Orthopedics;  Laterality: Left;  . AMPUTATION Left 03/09/2019   Procedure: LEFT FOOT 5TH RAY AMPUTATION;  Surgeon: [name], MD;  Location: [facility];  Service: Orthopedics;  Laterality: Left;  . AMPUTATION Left 04/18/2019   Procedure: LEFT BELOW KNEE AMPUTATION;  Surgeon: [name], MD;  Location: [employer];  Service: Orthopedics;  Laterality: Left;  . ANTERIOR VITRECTOMY Left 04/12/2016   Procedure: ANTERIOR CHAMBER [place] OUT WITH GAS FLUID EXCHANGE;  Surgeon: [name], MD;  Location: [facility];  Service: Ophthalmology;  Laterality: Left;  . CARDIAC CATHETERIZATION  10/2017  . CATARACT EXTRACTION Left   . CHOLECYSTECTOMY N/A 01/04/2019   Procedure: LAPAROSCOPIC CHOLECYSTECTOMY;  Surgeon: [name], MD;  Location: [facility];  Service: General;  Laterality: N/A;  . CORONARY ARTERY BYPASS GRAFT N/A 11/29/2017    LIMA-LAD, SVG-PDA, SVG-CFX  Procedure: CORONARY ARTERY BYPASS GRAFTING (CABG) times three using the left saphaneous vien. harvested endoscopicly and left internal mammary artery.;  Surgeon: [name], MD;  Location: [facility];  Service: Open Heart Surgery;  Laterality: N/A;  . EYE SURGERY     left eye surgery 04/2016  . IR CHOLANGIOGRAM EXISTING TUBE  10/17/2018  . IR CHOLANGIOGRAM EXISTING TUBE  10/30/2018  . IR PERC CHOLECYSTOSTOMY  09/05/2018  . KNEE SURGERY Left   . LOOP RECORDER INSERTION N/A 06/28/2018   Procedure: LOOP RECORDER INSERTION;  Surgeon: Deboraha Sprang, MD;  Location: [facility];  Service: Cardiovascular;  Laterality: N/A;  . RIGHT/LEFT HEART CATH AND CORONARY ANGIOGRAPHY N/A 10/19/2017   Procedure: RIGHT/LEFT HEART CATH AND CORONARY ANGIOGRAPHY;  Surgeon: [name], [name], MD;  Location: [facility];  Service: Cardiovascular;  Laterality: N/A;  . SHOULDER SURGERY Right   . TEE WITHOUT CARDIOVERSION N/A 11/29/2017   Procedure: TRANSESOPHAGEAL ECHOCARDIOGRAM (TEE);  Surgeon: [name], Collier Salina, MD;  Location: [facility];  Service: Open Heart Surgery;  Laterality: N/A;  . TEE WITHOUT CARDIOVERSION N/A 06/28/2018   Procedure: TRANSESOPHAGEAL ECHOCARDIOGRAM (TEE);  Surgeon: [name], MD;  Location: [facility];  Service: Cardiovascular;  Laterality: N/A;  . TOE AMPUTATION Left 11/28/15  4th toe       Family History  Problem Relation Age of Onset  . Cancer Paternal Grandmother   . COPD Mother   . Heart failure Mother   . Anxiety disorder Mother   . Depression Mother   . Fibromyalgia Mother   . Esophageal cancer Father        Smoker, still does  . Diabetes Sister   . Fibromyalgia Sister   . Diabetes Maternal Uncle   . Diabetes Maternal Grandmother   . GER disease Sister   . Liver disease Paternal Grandfather   . Hemachromatosis Paternal Grandfather   . Diabetes Maternal Grandfather     Social History   Tobacco Use  . Smoking status: Never Smoker  . Smokeless tobacco: Never Used  Vaping Use  . Vaping Use: Never used  Substance Use Topics  . Alcohol use: No  . Drug use: No    Home Medications Prior to Admission medications    Medication Sig Start Date End Date Taking? Authorizing Provider  aspirin EC 325 MG EC tablet Take 1 tablet (325 mg total) by mouth daily. Patient taking differently: Take 325 mg by mouth at bedtime.  12/04/17   Barrett, Erin R, PA-C  Blood Glucose Monitoring Suppl (ONETOUCH VERIO) w/Device KIT 1 application by Does not apply route 4 (four) times daily. 09/13/18   Harriet Butte, DO  clopidogrel (PLAVIX) 75 MG tablet TAKE 1 TABLET(75 MG) BY MOUTH DAILY 07/25/20   Brunetta Jeans, PA-C  furosemide (LASIX) 20 MG tablet TAKE 1 TABLET(20 MG) BY MOUTH DAILY 10/29/19   Skeet Latch, MD  glucose blood [facility] VERIO) test strip 1 each by Other route as needed for other. Use as instructed 09/18/18   Harriet Butte, DO  HYDROcodone-acetaminophen (NORCO/VICODIN) 5-325 MG tablet Take 1 tablet by mouth every 6 (six) hours as needed for up to 8 doses for severe pain. 09/27/20   Corena Herter, PA-C  ibuprofen (ADVIL) 400 MG tablet Take 1 tablet (400 mg total) by mouth 4 (four) times daily. 09/27/20   Corena Herter, PA-C  insulin detemir (LEVEMIR) 100 UNIT/ML FlexPen Inject 25 Units into the skin every morning. 09/24/20   Renato Shin, MD  Insulin Pen Needle 31G X 5 MM MISC Use as directed to inject insulin. Dx Code:e11.43 12/04/19   Dickie La, MD  metFORMIN (GLUCOPHAGE XR) 750 MG 24 hr tablet Take 1 tablet (750 mg total) by mouth 2 (two) times daily. 07/25/20   Brunetta Jeans, PA-C  metoCLOPramide (REGLAN) 10 MG tablet Take 1 tablet (10 mg total) by mouth every 8 (eight) hours as needed for nausea. 09/17/20   Brunetta Jeans, PA-C  metoprolol succinate (TOPROL-XL) 25 MG 24 hr tablet TAKE 1/2 TABLET BY MOUTH EVERY DAY 07/28/20   Skeet Latch, MD  ondansetron (ZOFRAN ODT) 4 MG disintegrating tablet Take 1 tablet (4 mg total) by mouth every 8 (eight) hours as needed for nausea or vomiting. 09/27/20   Nyoka Cowden, Rowe Clack, PA-C  ONETOUCH DELICA LANCETS 59D MISC 1 application by Does not apply route  4 (four) times daily. 09/18/18   Harriet Butte, DO  pantoprazole (PROTONIX) 40 MG tablet Take 1 tablet (40 mg total) by mouth daily. 07/22/20   Brunetta Jeans, PA-C  ramipril (ALTACE) 2.5 MG capsule Take 1 capsule (2.5 mg total) by mouth daily. 07/25/20   Brunetta Jeans, PA-C  rosuvastatin (CRESTOR) 40 MG tablet TAKE 1 TABLET(40 MG) BY MOUTH DAILY 10/17/19   Skeet Latch,  MD  tamsulosin (FLOMAX) 0.4 MG CAPS capsule Take 1 capsule (0.4 mg total) by mouth daily for 14 days. 09/27/20 10/11/20  Corena Herter, PA-C  Vitamin D, Ergocalciferol, (DRISDOL) 1.25 MG (50000 UNIT) CAPS capsule Take 1 capsule (50,000 Units total) by mouth every 7 (seven) days. 07/25/20   Brunetta Jeans, PA-C    Allergies    Patient has no known allergies.  Review of Systems   Review of Systems  All other systems reviewed and are negative.   Physical Exam Updated Vital Signs BP (!) 143/75   Pulse 71   Temp 98.2 F (36.8 C) (Oral)   Resp 16   SpO2 98%   Physical Exam Vitals and nursing note reviewed. Exam conducted with a chaperone present.  HENT:     Head: Normocephalic and atraumatic.  Eyes:     General: No scleral icterus.    Conjunctiva/sclera: Conjunctivae normal.  Cardiovascular:     Rate and Rhythm: Normal rate and regular rhythm.     Pulses: Normal pulses.     Heart sounds: Normal heart sounds.  Pulmonary:     Effort: Pulmonary effort is normal. No respiratory distress.     Breath sounds: Normal breath sounds.  Abdominal:     Comments: Soft, nondistended.  TTP in LLQ.  No significant left-sided CVAT.  No guarding.  No rigidity.  Musculoskeletal:     Right lower leg: No edema.     Left lower leg: No edema.  Skin:    General: Skin is dry.     Capillary Refill: Capillary refill takes less than 2 seconds.  Neurological:     Mental Status: He is alert and oriented to person, place, and time.     GCS: GCS eye subscore is 4. GCS verbal subscore is 5. GCS motor subscore is 6.   Psychiatric:        Mood and Affect: Mood normal.        Behavior: Behavior normal.        Thought Content: Thought content normal.     ED Results / Procedures / Treatments   Labs (all labs ordered are listed, but only abnormal results are displayed) Labs Reviewed  URINALYSIS, ROUTINE W REFLEX MICROSCOPIC - Abnormal; Notable for the following components:      Result Value   APPearance HAZY (*)    Glucose, UA >=500 (*)    Hgb urine dipstick LARGE (*)    Protein, ur 100 (*)    RBC / HPF >50 (*)    All other components within normal limits  CBC WITH DIFFERENTIAL/PLATELET - Abnormal; Notable for the following components:   WBC 12.7 (*)    HCT 37.5 (*)    Neutro Abs 10.2 (*)    Abs Immature Granulocytes 0.08 (*)    All other components within normal limits  COMPREHENSIVE METABOLIC PANEL - Abnormal; Notable for the following components:   Sodium 132 (*)    CO2 21 (*)    Glucose, Bld 301 (*)    BUN 21 (*)    Creatinine, Ser 1.63 (*)    GFR, Estimated 49 (*)    All other components within normal limits  LIPASE, BLOOD    EKG None  Radiology CT ABDOMEN PELVIS WO CONTRAST  Result Date: 09/27/2020 CLINICAL DATA:  Left-sided flank pain radiating to the groin. Evaluate for nephrolithiasis. EXAM: CT ABDOMEN AND PELVIS WITHOUT CONTRAST TECHNIQUE: Multidetector CT imaging of the abdomen and pelvis was performed following the standard protocol without  IV contrast. COMPARISON:  09/10/2020; 10/29/2018 FINDINGS: The lack of intravenous contrast limits the ability to evaluate solid abdominal organs. Lower chest: Limited visualization of the lower thorax demonstrates minimal bibasilar subsegmental atelectasis. Punctate right lower lobe granuloma. No pleural effusion. Normal heart size. Coronary artery calcifications. Post median sternotomy. Hepatobiliary: Normal hepatic contour. Post cholecystectomy. No ascites. Pancreas: Normal noncontrast appearance of the pancreas. Spleen: Normal noncontrast  appearance of the spleen. There is a small punctate splenule within the anterior aspect of the left upper abdomen. Adrenals/Urinary Tract: There is a punctate (4 mm) stone adjacent to the left UVJ with associated mild residual upstream ureterectasis and pelvicaliectasis. No additional left-sided renal stones are identified. There is a punctate (2 mm) nonobstructing stone within the mid aspect the right kidney (coronal image 87, series 5). No evidence of right-sided urinary obstruction. Moderate amount of grossly symmetric bilateral perinephric stranding. Normal noncontrast appearance the bilateral adrenal glands. Normal noncontrast appearance of the urinary bladder given degree of distention. Stomach/Bowel: Large colonic stool burden without evidence of enteric obstruction. Normal appearance of the terminal ileum and the appendix. No discrete areas of bowel wall thickening on this noncontrast examination. No pneumoperitoneum, pneumatosis or portal venous gas. Vascular/Lymphatic: Scattered atherosclerotic plaque within normal caliber abdominal aorta. No bulky retroperitoneal, mesenteric, pelvic or inguinal lymphadenopathy on this noncontrast examination. Reproductive: Normal appearance the prostate gland. No free fluid the pelvic cul-de-sac. Other: Mild diffuse body wall anasarca. Musculoskeletal: No acute or aggressive osseous abnormalities. Mild (approximately 25%) compression deformity involving the superior endplate of the L3 vertebral body is unchanged since the 10/2018 examination. IMPRESSION: 1. Punctate (4 mm) stone adjacent to the left UVJ with associated mild residual upstream ureterectasis and pelvicaliectasis. 2. No additional left-sided renal stones are identified 3. Punctate (approximately 2 mm) nonobstructing right-sided renal stone. No evidence of right-sided urinary obstruction. 4. Large colonic stool burden without evidence of enteric obstruction. 5. Aortic atherosclerosis (ICD10-I70.0).  Electronically Signed   By: Sandi Mariscal M.D.   On: 09/27/2020 09:26    Procedures Procedures (including critical care time)  Medications Ordered in ED Medications  morphine 4 MG/ML injection 4 mg (4 mg Intravenous Given 09/27/20 0749)  ondansetron (ZOFRAN) injection 4 mg (4 mg Intravenous Given 09/27/20 0940)  ketorolac (TORADOL) 30 MG/ML injection 30 mg (30 mg Intravenous Given 09/27/20 1106)    ED Course  I have reviewed the triage vital signs and the nursing notes.  Pertinent labs & imaging results that were available during my care of the patient were reviewed by me and considered in my medical decision making (see chart for details).    MDM Rules/Calculators/A&P                          Patient is presented to ED for acute onset left-sided flank pain that radiates into LLQ/groin.  Denies any history of obstructing ureterolithiasis.  He had a CT last month which did not reveal any obvious kidney stones.    Given his significant pain in the context of numerous comorbidities, will obtain comprehensive work-up including CT imaging for further evaluation of his acute onset significant left-sided flank/LLQ pain.  Labs Lipase: Within normal months. CBC with differential: Mild leukocytosis 12.7. CMP: Worsening creatinine to 1.63, increased from 1.06 and labs obtained last month.  He also has associated decrease in GFR to 49 for normal limits.  Hyponatremia of 132 is consistent with baseline. UA: No evidence to suggest infection.  Imaging CT abdomen and pelvis  without contrast is personally reviewed and demonstrates punctate 4 mm stone adjacent to the left UVJ with associated mild residual upstream ureterectasis.  There is a punctate nonobstructing right-sided nephrolithiasis, but no left-sided kidney stones noted.  No other acute or emergent pathology.  After patient returned from CT imaging, he became nauseated and had multiple episodes of nonbloody emesis.  Administered 4 mg IV  Zofran for relief.  He suspects its related to pain versus his gastroparesis.   On subsequent evaluation, patient feels as though his pain is much better controlled.  He states that he cannot take oral pain medication at home however due to his gastroparesis.  He states that they are typically ineffective.  However, he would prefer to try to manage his symptoms at home and would rather not be admitted for pain control.  Given that his pain is well controlled, feel as though it is reasonable for discharge home with pain medication and antiemetics.  We will also prescribe him tamsulosin to help facilitate passage of stone.  No evidence of infected stone.  If he is unable to control his pain symptoms, he can return to the hospital and be admitted for IV pain control.  Final Clinical Impression(s) / ED Diagnoses Final diagnoses:  Ureterolithiasis    Rx / DC Orders ED Discharge Orders         Ordered    tamsulosin (FLOMAX) 0.4 MG CAPS capsule  Daily        09/27/20 1133    HYDROcodone-acetaminophen (NORCO/VICODIN) 5-325 MG tablet  Every 6 hours PRN        09/27/20 1133    ondansetron (ZOFRAN ODT) 4 MG disintegrating tablet  Every 8 hours PRN        09/27/20 1133    ibuprofen (ADVIL) 400 MG tablet  4 times daily        09/27/20 1133           Reita Chard 09/27/20 1135    Blanchie Dessert, MD 09/27/20 2000

## 2020-09-29 ENCOUNTER — Ambulatory Visit (INDEPENDENT_AMBULATORY_CARE_PROVIDER_SITE_OTHER): Payer: Medicare Other

## 2020-09-29 DIAGNOSIS — I255 Ischemic cardiomyopathy: Secondary | ICD-10-CM

## 2020-10-03 NOTE — Progress Notes (Signed)
[facility] Summary Report / Loop Recorder 

## 2020-10-24 ENCOUNTER — Ambulatory Visit: Payer: Medicare Other | Admitting: Physician Assistant

## 2020-10-30 ENCOUNTER — Telehealth: Payer: Self-pay | Admitting: Emergency Medicine

## 2020-10-30 NOTE — Telephone Encounter (Signed)
Called patient and left a message on VM advising patient that we received 2 refill request from [facility] for Losartan 25 mg.  This medication is not on patient current medication list. He should be taking Metoprolol XL 25 mg 0.5 mg tab daily which is prescribed by his Cardiologist. He is due for an follow up appointment. Sent denies faxed to the pharmacy as well.

## 2020-11-03 ENCOUNTER — Ambulatory Visit (INDEPENDENT_AMBULATORY_CARE_PROVIDER_SITE_OTHER): Payer: Medicare Other

## 2020-11-03 DIAGNOSIS — I639 Cerebral infarction, unspecified: Secondary | ICD-10-CM | POA: Diagnosis not present

## 2020-11-03 LAB — CUP PACEART REMOTE DEVICE CHECK
Date Time Interrogation Session: 20211121233441
Implantable Pulse Generator Implant Date: 20190717

## 2020-11-05 NOTE — Progress Notes (Signed)
[facility] Summary Report / Loop Recorder 

## 2020-11-10 ENCOUNTER — Other Ambulatory Visit: Payer: Self-pay | Admitting: Cardiovascular Disease

## 2020-11-11 ENCOUNTER — Other Ambulatory Visit: Payer: Self-pay | Admitting: Physician Assistant

## 2020-11-11 DIAGNOSIS — R1310 Dysphagia, unspecified: Secondary | ICD-10-CM

## 2020-11-12 ENCOUNTER — Other Ambulatory Visit: Payer: Self-pay

## 2020-11-12 ENCOUNTER — Ambulatory Visit (INDEPENDENT_AMBULATORY_CARE_PROVIDER_SITE_OTHER): Payer: Medicare Other | Admitting: Endocrinology

## 2020-11-12 ENCOUNTER — Encounter: Payer: Self-pay | Admitting: Endocrinology

## 2020-11-12 VITALS — BP 124/76 | HR 78 | Ht 74.0 in | Wt 230.0 lb

## 2020-11-12 DIAGNOSIS — Z794 Long term (current) use of insulin: Secondary | ICD-10-CM | POA: Diagnosis not present

## 2020-11-12 DIAGNOSIS — E1143 Type 2 diabetes mellitus with diabetic autonomic (poly)neuropathy: Secondary | ICD-10-CM | POA: Diagnosis not present

## 2020-11-12 LAB — GLUCOSE, POCT (MANUAL RESULT ENTRY): POC Glucose: 179 mg/dl — AB (ref 70–99)

## 2020-11-12 LAB — POCT GLYCOSYLATED HEMOGLOBIN (HGB A1C): Hemoglobin A1C: 8.5 % — AB (ref 4.0–5.6)

## 2020-11-12 MED ORDER — INSULIN DETEMIR 100 UNIT/ML FLEXPEN
28.0000 [IU] | PEN_INJECTOR | SUBCUTANEOUS | 11 refills | Status: DC
Start: 2020-11-12 — End: 2020-12-08

## 2020-11-12 MED ORDER — METFORMIN HCL ER 750 MG PO TB24
750.0000 mg | ORAL_TABLET | Freq: Every day | ORAL | 2 refills | Status: DC
Start: 2020-11-12 — End: 2021-02-11

## 2020-11-12 NOTE — Patient Instructions (Addendum)
check your blood sugar twice a day.  vary the time of day when you check, between before the 3 meals, and at bedtime.  also check if you have symptoms of your blood sugar being too high or too low.  please keep a record of the readings and bring it to your next appointment here (or you can bring the meter itself).  You can write it on any piece of paper.  please call us sooner if your blood sugar goes below 70, or if you have a lot of readings over 200.  For now, please: Increase the Levemir to 28 units each morning, and:  Reduce the metformin to 1 pill per day. On this type of insulin schedule, you should eat meals on a regular schedule.  If a meal is missed or significantly delayed, your blood sugar could go low.   Please come back for a follow-up appointment in 2 months.

## 2020-11-12 NOTE — Progress Notes (Signed)
Subjective:    Patient ID: Travis [name], male    DOB: [birth year], 48 y.o.   MRN: [id number]  HPI Pt returns for f/u of diabetes mellitus: DM type: 1 Dx'ed: 6226 Complications: DR, CAD, PN, GP, DN, and Left BKA Therapy: insulin since 2015 DKA: 2017 Severe hypoglycemia: never Pancreatitis: never Pancreatic imaging: normal on 2021 CT SDOH:  Other:  He cannot use syringe and vial, due to PN of the hands; he is on QD insulin, at least for now. Interval history: no cbg record, but states cbg's vary from 127-200. There is no trend throughout the day. pt states he feels well in general. Past Medical History:  Diagnosis Date  . Acute on chronic systolic and diastolic heart failure, NYHA class 3 ([name]) 09/14/2017  . Acute osteomyelitis of metatarsal bone of left foot ([name]) 11/12/2015  . CAD in native artery 09/14/2017  . Closed fracture of right tibial plateau 11/11/2015  . Coronary artery disease   . Dehiscence of amputation stump (HCC)    left leg  . Depression with anxiety   . Diabetes mellitus    INSULIN DEPENDENT Type II  . Diabetic peripheral neuropathy associated with type 2 diabetes mellitus ([name]) 08/30/2008   Qualifier: Diagnosis of  By: [name] Done FNP, Tori Milks    . Diabetic ulcer of left foot ([name]) 11/14/2015  . Gastroparesis   . GERD (gastroesophageal reflux disease)   . History of loop recorder 06/2018   Last remote device check was on 02/14/2019  . Hypertension    patient denies  . Left ventricular aneurysm    REPORTS HE IS NOT AWAREW OF THIS   . Myocardial infarction ([name])   . Osteomyelitis ([name])   . Osteoporosis 11/12/2015  . Peripheral neuropathy    feet and below bilateral knees  . Peripheral vascular disease (HCC)    PAD in Left leg and some in right leg  . Shortness of breath dyspnea   . Stroke ([name]) 05/2018  . Vitamin D deficiency 11/12/2015    Past Surgical History:  Procedure Laterality Date  . AMPUTATION Left 11/28/2015   Procedure: Left 4th Ray  Amputation;  Surgeon: [name], MD;  Location: [place];  Service: Orthopedics;  Laterality: Left;  . AMPUTATION Left 10/29/2016   Procedure: LEFT 5TH RAY AMPUTATION;  Surgeon: [name], MD;  Location: [place];  Service: Orthopedics;  Laterality: Left;  . AMPUTATION Left 03/09/2019   Procedure: LEFT FOOT 5TH RAY AMPUTATION;  Surgeon: [name], MD;  Location: [place];  Service: Orthopedics;  Laterality: Left;  . AMPUTATION Left 04/18/2019   Procedure: LEFT BELOW KNEE AMPUTATION;  Surgeon: [name], MD;  Location: [place];  Service: Orthopedics;  Laterality: Left;  . ANTERIOR VITRECTOMY Left 04/12/2016   Procedure: ANTERIOR CHAMBER [place] OUT WITH GAS FLUID EXCHANGE;  Surgeon: [name], MD;  Location: [place];  Service: Ophthalmology;  Laterality: Left;  . CARDIAC CATHETERIZATION  10/2017  . CATARACT EXTRACTION Left   . CHOLECYSTECTOMY N/A 01/04/2019   Procedure: LAPAROSCOPIC CHOLECYSTECTOMY;  Surgeon: [name], MD;  [facility];  Service: General;  Laterality: N/A;  . CORONARY ARTERY BYPASS GRAFT N/A 11/29/2017    LIMA-LAD, SVG-PDA, SVG-CFX  Procedure: CORONARY ARTERY BYPASS GRAFTING (CABG) times three using the left saphaneous vien. harvested endoscopicly and left internal mammary artery.;  Surgeon: [name], MD;  Location: [place];  Service: Open Heart Surgery;  Laterality: N/A;  . EYE SURGERY     left eye  surgery 04/2016  . IR CHOLANGIOGRAM EXISTING TUBE  10/17/2018  . IR CHOLANGIOGRAM EXISTING TUBE  10/30/2018  . IR PERC CHOLECYSTOSTOMY  09/05/2018  . KNEE SURGERY Left   . LOOP RECORDER INSERTION N/A 06/28/2018   Procedure: LOOP RECORDER INSERTION;  Surgeon: Deboraha Sprang, MD;  Location: [place] [facility];  Service: Cardiovascular;  Laterality: N/A;  . RIGHT/LEFT HEART CATH AND CORONARY ANGIOGRAPHY N/A 10/19/2017   Procedure: RIGHT/LEFT HEART CATH AND CORONARY ANGIOGRAPHY;  Surgeon: [name], [name], MD;  Location: [place] [facility];  Service: Cardiovascular;   Laterality: N/A;  . SHOULDER SURGERY Right   . TEE WITHOUT CARDIOVERSION N/A 11/29/2017   Procedure: TRANSESOPHAGEAL ECHOCARDIOGRAM (TEE);  Surgeon: [name], Collier Salina, MD;  Location: [place];  Service: Open Heart Surgery;  Laterality: N/A;  . TEE WITHOUT CARDIOVERSION N/A 06/28/2018   Procedure: TRANSESOPHAGEAL ECHOCARDIOGRAM (TEE);  Surgeon: [name], MD;  Location: [facility];  Service: Cardiovascular;  Laterality: N/A;  . TOE AMPUTATION Left 11/28/15   4th toe    Social History   Socioeconomic History  . Marital status: Divorced    Spouse name: Not on file  . Number of children: 3  . Years of education: HS  . Highest education level: Not on file  Occupational History  . Occupation: Disabled  Tobacco Use  . Smoking status: Never Smoker  . Smokeless tobacco: Never Used  Vaping Use  . Vaping Use: Never used  Substance and Sexual Activity  . Alcohol use: No  . Drug use: No  . Sexual activity: Yes  Other Topics Concern  . Not on file  Social History Narrative   Lives at home with his children.   Right-handed.   No caffeine use.   Social Determinants of Health   Financial Resource Strain:   . Difficulty of Paying Living Expenses: Not on file  Food Insecurity:   . Worried About Charity fundraiser in the Last Year: Not on file  . Ran Out of Food in the Last Year: Not on file  Transportation Needs:   . Lack of Transportation (Medical): Not on file  . Lack of Transportation (Non-Medical): Not on file  Physical Activity:   . Days of Exercise per Week: Not on file  . Minutes of Exercise per Session: Not on file  Stress:   . Feeling of Stress : Not on file  Social Connections:   . Frequency of Communication with Friends and Family: Not on file  . Frequency of Social Gatherings with Friends and Family: Not on file  . Attends Religious Services: Not on file  . Active Member of Clubs or Organizations: Not on file  . Attends Archivist Meetings: Not on file   . Marital Status: Not on file  Intimate Partner Violence:   . Fear of Current or Ex-Partner: Not on file  . Emotionally Abused: Not on file  . Physically Abused: Not on file  . Sexually Abused: Not on file    Current Outpatient Medications on File Prior to Visit  Medication Sig Dispense Refill  . aspirin EC 325 MG EC tablet Take 1 tablet (325 mg total) by mouth daily. (Patient taking differently: Take 325 mg by mouth at bedtime. ) 30 tablet 0  . Blood Glucose Monitoring Suppl (ONETOUCH VERIO) w/Device KIT 1 application by Does not apply route 4 (four) times daily. 1 kit 0  . clopidogrel (PLAVIX) 75 MG tablet TAKE 1 TABLET(75 MG) BY MOUTH DAILY 90 tablet 1  .  furosemide (LASIX) 20 MG tablet TAKE 1 TABLET(20 MG) BY MOUTH DAILY 90 tablet 0  . glucose blood (ONETOUCH VERIO) test strip 1 each by Other route as needed for other. Use as instructed 100 each 12  . ibuprofen (ADVIL) 400 MG tablet Take 1 tablet (400 mg total) by mouth 4 (four) times daily. 30 tablet 0  . Insulin Pen Needle 31G X 5 MM MISC Use as directed to inject insulin. Dx Code:e11.43 30 each 12  . metoCLOPramide (REGLAN) 10 MG tablet Take 1 tablet (10 mg total) by mouth every 8 (eight) hours as needed for nausea. 30 tablet 0  . metoprolol succinate (TOPROL-XL) 25 MG 24 hr tablet TAKE 1/2 TABLET BY MOUTH EVERY DAY 45 tablet 1  . ondansetron (ZOFRAN ODT) 4 MG disintegrating tablet Take 1 tablet (4 mg total) by mouth every 8 (eight) hours as needed for nausea or vomiting. 20 tablet 0  . ONETOUCH DELICA LANCETS 07P MISC 1 application by Does not apply route 4 (four) times daily. 100 each 12  . pantoprazole (PROTONIX) 40 MG tablet Take 1 tablet (40 mg total) by mouth daily. 30 tablet 3  . ramipril (ALTACE) 2.5 MG capsule Take 1 capsule (2.5 mg total) by mouth daily. 30 capsule 3  . rosuvastatin (CRESTOR) 40 MG tablet TAKE 1 TABLET(40 MG) BY MOUTH DAILY 90 tablet 1  . Vitamin D, Ergocalciferol, (DRISDOL) 1.25 MG (50000 UNIT) CAPS  capsule Take 1 capsule (50,000 Units total) by mouth every 7 (seven) days. 12 capsule 0   No current facility-administered medications on file prior to visit.    No Known Allergies  Family History  Problem Relation Age of Onset  . Cancer Paternal Grandmother   . COPD Mother   . Heart failure Mother   . Anxiety disorder Mother   . Depression Mother   . Fibromyalgia Mother   . Esophageal cancer Father        Smoker, still does  . Diabetes Sister   . Fibromyalgia Sister   . Diabetes Maternal Uncle   . Diabetes Maternal Grandmother   . GER disease Sister   . Liver disease Paternal Grandfather   . Hemachromatosis Paternal Grandfather   . Diabetes Maternal Grandfather     BP 124/76   Pulse 78   Ht '6\' 2"'  (1.88 m)   Wt 230 lb (104.3 kg)   SpO2 99%   BMI 29.53 kg/m   Review of Systems He denies hypoglycemia    Objective:   Physical Exam VITAL SIGNS:  See vs page GENERAL: no distress Pulses: right dorsalis pedis intact.   MSK: no deformity of the right foot. Left BKA CV: no right leg leg edema Skin:  no ulcer on the feet.  normal color and temp on the right foot.  Neuro: sensation is absent to touch on the right foot.    Lab Results  Component Value Date   HGBA1C 8.5 (A) 11/12/2020   Lab Results  Component Value Date   CREATININE 1.63 (H) 09/27/2020   BUN 21 (H) 09/27/2020   NA 132 (L) 09/27/2020   K 4.2 09/27/2020   CL 100 09/27/2020   CO2 21 (L) 09/27/2020       Assessment & Plan:  Type 1 DM: uncontrolled CRI: we should phase out metformin  Patient Instructions  check your blood sugar twice a day.  vary the time of day when you check, between before the 3 meals, and at bedtime.  also check if you have symptoms  of your blood sugar being too high or too low.  please keep a record of the readings and bring it to your next appointment here (or you can bring the meter itself).  You can write it on any piece of paper.  please call us sooner if your blood sugar  goes below 70, or if you have a lot of readings over 200.  For now, please: Increase the Levemir to 28 units each morning, and:  Reduce the metformin to 1 pill per day. On this type of insulin schedule, you should eat meals on a regular schedule.  If a meal is missed or significantly delayed, your blood sugar could go low.   Please come back for a follow-up appointment in 2 months.

## 2020-11-13 ENCOUNTER — Other Ambulatory Visit: Payer: Medicare Other

## 2020-11-14 ENCOUNTER — Other Ambulatory Visit: Payer: Medicare Other

## 2020-12-02 ENCOUNTER — Ambulatory Visit
Admission: RE | Admit: 2020-12-02 | Discharge: 2020-12-02 | Disposition: A | Discharge status: Home / Self Care | Payer: Medicare Other | Source: Ambulatory Visit | Attending: Physician Assistant | Admitting: Physician Assistant

## 2020-12-02 DIAGNOSIS — R1310 Dysphagia, unspecified: Secondary | ICD-10-CM

## 2020-12-07 LAB — CUP PACEART REMOTE DEVICE CHECK
Date Time Interrogation Session: 20211224233719
Implantable Pulse Generator Implant Date: 20190717

## 2020-12-08 ENCOUNTER — Ambulatory Visit (INDEPENDENT_AMBULATORY_CARE_PROVIDER_SITE_OTHER): Payer: Medicare Other

## 2020-12-08 ENCOUNTER — Telehealth: Payer: Self-pay | Admitting: Endocrinology

## 2020-12-08 ENCOUNTER — Other Ambulatory Visit: Payer: Self-pay | Admitting: *Deleted

## 2020-12-08 DIAGNOSIS — I255 Ischemic cardiomyopathy: Secondary | ICD-10-CM

## 2020-12-08 MED ORDER — INSULIN DETEMIR 100 UNIT/ML FLEXPEN: 28.0000 [IU] | PEN_INJECTOR | SUBCUTANEOUS | 11 refills | Status: DC

## 2020-12-08 NOTE — Telephone Encounter (Signed)
Dr. Everardo All had it on "no print", rx was redone and sent to patients pharmacy

## 2020-12-08 NOTE — Telephone Encounter (Signed)
Patient guardian [name] called to advise that the RX for Levemir sent by Dr Everardo All on 11/12/20 was not received by [facility].  Please note patient is out of Levemir, please resend - call back [phone]

## 2020-12-22 NOTE — Progress Notes (Signed)
[facility] Summary Report / Loop Recorder 

## 2021-01-10 LAB — CUP PACEART REMOTE DEVICE CHECK
Date Time Interrogation Session: 20220126234223
Implantable Pulse Generator Implant Date: 20190717

## 2021-01-12 ENCOUNTER — Ambulatory Visit (INDEPENDENT_AMBULATORY_CARE_PROVIDER_SITE_OTHER): Payer: Medicare Other

## 2021-01-12 DIAGNOSIS — I639 Cerebral infarction, unspecified: Secondary | ICD-10-CM

## 2021-01-15 ENCOUNTER — Ambulatory Visit: Payer: Medicare Other | Admitting: Endocrinology

## 2021-01-21 NOTE — Progress Notes (Signed)
[facility] Summary Report / Loop Recorder 

## 2021-01-30 ENCOUNTER — Other Ambulatory Visit: Payer: Self-pay | Admitting: Physician Assistant

## 2021-02-02 ENCOUNTER — Other Ambulatory Visit: Payer: Self-pay | Admitting: Family

## 2021-02-11 ENCOUNTER — Ambulatory Visit (INDEPENDENT_AMBULATORY_CARE_PROVIDER_SITE_OTHER): Payer: Medicare Other | Admitting: Endocrinology

## 2021-02-11 ENCOUNTER — Other Ambulatory Visit: Payer: Self-pay

## 2021-02-11 VITALS — BP 150/84 | HR 84 | Ht 74.0 in | Wt 234.2 lb

## 2021-02-11 DIAGNOSIS — E1143 Type 2 diabetes mellitus with diabetic autonomic (poly)neuropathy: Secondary | ICD-10-CM | POA: Diagnosis not present

## 2021-02-11 DIAGNOSIS — Z794 Long term (current) use of insulin: Secondary | ICD-10-CM

## 2021-02-11 LAB — POCT GLYCOSYLATED HEMOGLOBIN (HGB A1C): Hemoglobin A1C: 9.7 % — AB (ref 4.0–5.6)

## 2021-02-11 MED ORDER — INSULIN DETEMIR 100 UNIT/ML FLEXPEN
35.0000 [IU] | PEN_INJECTOR | SUBCUTANEOUS | 3 refills | Status: DC
Start: 2021-02-11 — End: 2022-02-11

## 2021-02-11 NOTE — Progress Notes (Signed)
Subjective:    Patient ID: Travis [name], male    DOB: October 05, 1971, 50 y.o.   MRN: [id number]  HPI Pt returns for f/u of diabetes mellitus:  DM type: 1 Dx'ed: 9924 Complications: DR, CAD, PN, stage 3b CRI, GP, DN, and Left BKA.   Therapy: insulin since 2015.   DKA: 2017 Severe hypoglycemia: never Pancreatitis: never Pancreatic imaging: normal on 2021 CT SDOH:  Other:  He cannot take multiple daily injections, and he also cannot use syringe and vial--both due to PN of the hands. Interval history: he has not recently checked cbg, due to PN of the hands There is no trend throughout the day. pt states he feels well in general.  He stopped metformin, due to n/d.   Past Medical History:  Diagnosis Date  . Acute on chronic systolic and diastolic heart failure, NYHA class 3 ([place]) 09/14/2017  . Acute osteomyelitis of metatarsal bone of left foot ([place]) 11/12/2015  . CAD in native artery 09/14/2017  . Closed fracture of right tibial plateau 11/11/2015  . Coronary artery disease   . Dehiscence of amputation stump (HCC)    left leg  . Depression with anxiety   . Diabetes mellitus    INSULIN DEPENDENT Type II  . Diabetic peripheral neuropathy associated with type 2 diabetes mellitus ([place]) 08/30/2008   Qualifier: Diagnosis of  By: [name] Done FNP, Tori Milks    . Diabetic ulcer of left foot ([place]) 11/14/2015  . Gastroparesis   . GERD (gastroesophageal reflux disease)   . History of loop recorder 06/2018   Last remote device check was on 02/14/2019  . Hypertension    patient denies  . Left ventricular aneurysm    REPORTS HE IS NOT AWAREW OF THIS   . Myocardial infarction ([place])   . Osteomyelitis ([place])   . Osteoporosis 11/12/2015  . Peripheral neuropathy    feet and below bilateral knees  . Peripheral vascular disease (HCC)    PAD in Left leg and some in right leg  . Shortness of breath dyspnea   . Stroke ([place]) 05/2018  . Vitamin D deficiency 11/12/2015    Past Surgical History:  Procedure  Laterality Date  . AMPUTATION Left 11/28/2015   Procedure: Left 4th Ray Amputation;  Surgeon: [name], MD;  Location: [place];  Service: Orthopedics;  Laterality: Left;  . AMPUTATION Left 10/29/2016   Procedure: LEFT 5TH RAY AMPUTATION;  Surgeon: [name], MD;  Location: [place];  Service: Orthopedics;  Laterality: Left;  . AMPUTATION Left 03/09/2019   Procedure: LEFT FOOT 5TH RAY AMPUTATION;  Surgeon: [name], MD;  Location: [place];  Service: Orthopedics;  Laterality: Left;  . AMPUTATION Left 04/18/2019   Procedure: LEFT BELOW KNEE AMPUTATION;  Surgeon: [name], MD;  Location: [place];  Service: Orthopedics;  Laterality: Left;  . ANTERIOR VITRECTOMY Left 04/12/2016   Procedure: ANTERIOR CHAMBER [place] OUT WITH GAS FLUID EXCHANGE;  Surgeon: [name], MD;  Location: [place];  Service: Ophthalmology;  Laterality: Left;  . CARDIAC CATHETERIZATION  10/2017  . CATARACT EXTRACTION Left   . CHOLECYSTECTOMY N/A 01/04/2019   Procedure: LAPAROSCOPIC CHOLECYSTECTOMY;  Surgeon: [name], MD;  [facility];  Service: General;  Laterality: N/A;  . CORONARY ARTERY BYPASS GRAFT N/A 11/29/2017    LIMA-LAD, SVG-PDA, SVG-CFX  Procedure: CORONARY ARTERY BYPASS GRAFTING (CABG) times three using the left saphaneous vien. harvested endoscopicly and left internal mammary artery.;  Surgeon: [name], MD;  [facility]: [facility];  Service: Open Heart Surgery;  Laterality: N/A;  . EYE SURGERY     left eye surgery 04/2016  . IR CHOLANGIOGRAM EXISTING TUBE  10/17/2018  . IR CHOLANGIOGRAM EXISTING TUBE  10/30/2018  . IR PERC CHOLECYSTOSTOMY  09/05/2018  . KNEE SURGERY Left   . LOOP RECORDER INSERTION N/A 06/28/2018   Procedure: LOOP RECORDER INSERTION;  Surgeon: Deboraha Sprang, MD;  Location: [place] [facility];  Service: Cardiovascular;  Laterality: N/A;  . RIGHT/LEFT HEART CATH AND CORONARY ANGIOGRAPHY N/A 10/19/2017   Procedure: RIGHT/LEFT HEART CATH AND CORONARY ANGIOGRAPHY;  Surgeon: [name],  [name], MD;  Location: [place] [facility];  Service: Cardiovascular;  Laterality: N/A;  . SHOULDER SURGERY Right   . TEE WITHOUT CARDIOVERSION N/A 11/29/2017   Procedure: TRANSESOPHAGEAL ECHOCARDIOGRAM (TEE);  Surgeon: [name], Collier Salina, MD;  Location: [place];  Service: Open Heart Surgery;  Laterality: N/A;  . TEE WITHOUT CARDIOVERSION N/A 06/28/2018   Procedure: TRANSESOPHAGEAL ECHOCARDIOGRAM (TEE);  Surgeon: [name], MD;  Location: [facility];  Service: Cardiovascular;  Laterality: N/A;  . TOE AMPUTATION Left 11/28/15   4th toe    Social History   Socioeconomic History  . Marital status: Divorced    Spouse name: Not on file  . Number of children: 3  . Years of education: HS  . Highest education level: Not on file  Occupational History  . Occupation: Disabled  Tobacco Use  . Smoking status: Never Smoker  . Smokeless tobacco: Never Used  Vaping Use  . Vaping Use: Never used  Substance and Sexual Activity  . Alcohol use: No  . Drug use: No  . Sexual activity: Yes  Other Topics Concern  . Not on file  Social History Narrative   Lives at home with his children.   Right-handed.   No caffeine use.   Social Determinants of Health   Financial Resource Strain: Not on file  Food Insecurity: Not on file  Transportation Needs: Not on file  Physical Activity: Not on file  Stress: Not on file  Social Connections: Not on file  Intimate Partner Violence: Not on file    Current Outpatient Medications on File Prior to Visit  Medication Sig Dispense Refill  . aspirin EC 325 MG EC tablet Take 1 tablet (325 mg total) by mouth daily. (Patient taking differently: Take 325 mg by mouth at bedtime.) 30 tablet 0  . Blood Glucose Monitoring Suppl (ONETOUCH VERIO) w/Device KIT 1 application by Does not apply route 4 (four) times daily. 1 kit 0  . clopidogrel (PLAVIX) 75 MG tablet TAKE 1 TABLET(75 MG) BY MOUTH DAILY 90 tablet 1  . furosemide (LASIX) 20 MG tablet TAKE 1 TABLET(20 MG) BY  MOUTH DAILY 90 tablet 0  . glucose blood (ONETOUCH VERIO) test strip 1 each by Other route as needed for other. Use as instructed 100 each 12  . ibuprofen (ADVIL) 400 MG tablet Take 1 tablet (400 mg total) by mouth 4 (four) times daily. 30 tablet 0  . Insulin Pen Needle 31G X 5 MM MISC Use as directed to inject insulin. Dx Code:e11.43 30 each 12  . metoCLOPramide (REGLAN) 10 MG tablet Take 1 tablet (10 mg total) by mouth every 8 (eight) hours as needed for nausea. 30 tablet 0  . metoprolol succinate (TOPROL-XL) 25 MG 24 hr tablet TAKE 1/2 TABLET BY MOUTH EVERY DAY 45 tablet 1  . ondansetron (ZOFRAN ODT) 4 MG disintegrating tablet Take 1 tablet (4 mg total) by  mouth every 8 (eight) hours as needed for nausea or vomiting. 20 tablet 0  . ONETOUCH DELICA LANCETS 81E MISC 1 application by Does not apply route 4 (four) times daily. 100 each 12  . pantoprazole (PROTONIX) 40 MG tablet Take 1 tablet (40 mg total) by mouth daily. 30 tablet 3  . ramipril (ALTACE) 2.5 MG capsule TAKE 1 CAPSULE(2.5 MG) BY MOUTH DAILY 90 capsule 1  . rosuvastatin (CRESTOR) 40 MG tablet TAKE 1 TABLET(40 MG) BY MOUTH DAILY 90 tablet 1  . Vitamin D, Ergocalciferol, (DRISDOL) 1.25 MG (50000 UNIT) CAPS capsule Take 1 capsule (50,000 Units total) by mouth every 7 (seven) days. 12 capsule 0   No current facility-administered medications on file prior to visit.    No Known Allergies  Family History  Problem Relation Age of Onset  . Cancer Paternal Grandmother   . COPD Mother   . Heart failure Mother   . Anxiety disorder Mother   . Depression Mother   . Fibromyalgia Mother   . Esophageal cancer Father        Smoker, still does  . Diabetes Sister   . Fibromyalgia Sister   . Diabetes Maternal Uncle   . Diabetes Maternal Grandmother   . GER disease Sister   . Liver disease Paternal Grandfather   . Hemachromatosis Paternal Grandfather   . Diabetes Maternal Grandfather     BP (!) 150/84 (BP Location: Right Arm, Patient  Position: Sitting, Cuff Size: Large)   Pulse 84   Ht 6' 2" (1.88 m)   Wt 234 lb 3.2 oz (106.2 kg)   SpO2 98%   BMI 30.07 kg/m    Review of Systems He denies hypoglycemia.    Objective:   Physical Exam VITAL SIGNS:  See vs page GENERAL: no distress Pulses: right dorsalis pedis intact.   MSK: no deformity of the right foot. Left BKA CV: no right leg leg edema Skin:  no ulcer on the feet.  normal color and temp on the right foot.  Neuro: sensation is absent to touch on the right foot.    A1c=9.7%  Lab Results  Component Value Date   CREATININE 1.63 (H) 09/27/2020   BUN 21 (H) 09/27/2020   NA 132 (L) 09/27/2020   K 4.2 09/27/2020   CL 100 09/27/2020   CO2 21 (L) 09/27/2020      Assessment & Plan:  HTN: is noted today Type 1 DM, with stage 3b CRI: uncontrolled.  N/d, due to metformin.  Pt is advised to stay off it.   Patient Instructions  Your blood pressure is high today.  Please see your primary care provider soon, to have it rechecked check your blood sugar twice a day.  vary the time of day when you check, between before the 3 meals, and at bedtime.  also check if you have symptoms of your blood sugar being too high or too low.  please keep a record of the readings and bring it to your next appointment here (or you can bring the meter itself).  You can write it on any piece of paper.  please call us sooner if your blood sugar goes below 70, or if you have a lot of readings over 200.  please increase the Levemir to 35 units each morning.   On this type of insulin schedule, you should eat meals on a regular schedule.  If a meal is missed or significantly delayed, your blood sugar could go low.   Please come  back for a follow-up appointment in 2 months.

## 2021-02-11 NOTE — Patient Instructions (Addendum)
Your blood pressure is high today.  Please see your primary care provider soon, to have it rechecked check your blood sugar twice a day.  vary the time of day when you check, between before the 3 meals, and at bedtime.  also check if you have symptoms of your blood sugar being too high or too low.  please keep a record of the readings and bring it to your next appointment here (or you can bring the meter itself).  You can write it on any piece of paper.  please call us sooner if your blood sugar goes below 70, or if you have a lot of readings over 200.  please increase the Levemir to 35 units each morning.   On this type of insulin schedule, you should eat meals on a regular schedule.  If a meal is missed or significantly delayed, your blood sugar could go low.   Please come back for a follow-up appointment in 2 months.

## 2021-02-16 ENCOUNTER — Ambulatory Visit (INDEPENDENT_AMBULATORY_CARE_PROVIDER_SITE_OTHER): Payer: Medicare Other

## 2021-02-16 DIAGNOSIS — I639 Cerebral infarction, unspecified: Secondary | ICD-10-CM | POA: Diagnosis not present

## 2021-02-18 LAB — CUP PACEART REMOTE DEVICE CHECK
Date Time Interrogation Session: 20220228235324
Implantable Pulse Generator Implant Date: 20190717

## 2021-02-23 ENCOUNTER — Ambulatory Visit: Payer: Medicare Other | Admitting: Orthopedic Surgery

## 2021-02-25 NOTE — Progress Notes (Signed)
[facility] Summary Report / Loop Recorder 

## 2021-02-26 ENCOUNTER — Encounter: Payer: Self-pay | Admitting: Orthopedic Surgery

## 2021-02-26 ENCOUNTER — Other Ambulatory Visit: Payer: Self-pay

## 2021-02-26 ENCOUNTER — Ambulatory Visit (INDEPENDENT_AMBULATORY_CARE_PROVIDER_SITE_OTHER): Payer: Medicare Other | Admitting: Physician Assistant

## 2021-02-26 DIAGNOSIS — Z89512 Acquired absence of left leg below knee: Secondary | ICD-10-CM

## 2021-02-26 DIAGNOSIS — S88112A Complete traumatic amputation at level between knee and ankle, left lower leg, initial encounter: Secondary | ICD-10-CM

## 2021-02-26 NOTE — Progress Notes (Signed)
Office Visit Note   Patient: Travis [name]           Date of Birth: 08-13-1971           MRN: [id number] Visit Date: 02/26/2021              Requested by: Waldon Merl, PA-C [address] [place],  [place] [address] PCP: Waldon Merl, PA-C  Chief Complaint  Patient presents with  . Right Achilles Tendon - Pain      HPI: Patient presents today with his wife.  He has a history of a left below-knee amputation.  His prosthetic is no longer fitting as he has had a loss in volume.  At one time he was wearing 7 socks but they tried to modify his prosthetic and he was able to wear 3.  Despite this the prosthetic is still too big and is beginning to abrade on his leg.  He also feels unstable  Assessment & Plan: Visit Diagnoses: No diagnosis found.  Plan: Prescription was provided for new prosthetic follow-up as needed  Follow-Up Instructions: No follow-ups on file.   Ortho Exam  Patient is alert, oriented, no adenopathy, well-dressed, normal affect, normal respiratory effort. Left below-knee amputation stump has healed well.  No signs of infection well apposed wound edges.  Significant loss in volume on the inner thigh he does have 1 very small area of contact pressure from the prosthetic rubbing in this area Patient is an existing left transtibial  amputee.  Patient's current comorbidities are not expected to impact the ability to function with the prescribed prosthesis. Patient verbally communicates a strong desire to use a prosthesis. Patient currently requires mobility aids to ambulate without a prosthesis.  Expects not to use mobility aids with a new prosthesis.  Patient is a K3 level ambulator that spends a lot of time walking around on uneven terrain over obstacles, up and down stairs, and ambulates with a variable cadence.    Imaging: No results found. No images are attached to the encounter.  Labs: Lab Results  Component Value Date   HGBA1C 9.7 (A)  02/11/2021   HGBA1C 8.5 (A) 11/12/2020   HGBA1C 9.0 (A) 09/24/2020   ESRSEDRATE 1 11/09/2016   ESRSEDRATE 24 (H) 10/06/2016   CRP 0.6 11/09/2016   CRP 6.3 (H) 10/06/2016   REPTSTATUS 11/03/2018 FINAL 10/29/2018   REPTSTATUS 11/03/2018 FINAL 10/29/2018   CULT  10/29/2018    NO GROWTH 5 DAYS Performed at [facility], [address] [place], [place] [address]    CULT  10/29/2018    NO GROWTH 5 DAYS Performed at [facility], [address] [place], [place] [address]      Lab Results  Component Value Date   ALBUMIN 3.9 09/27/2020   ALBUMIN 4.2 07/22/2020   ALBUMIN 3.5 04/18/2019   PREALBUMIN 20.2 11/11/2015    Lab Results  Component Value Date   MG 2.0 11/01/2018   MG 2.1 11/30/2017   MG 2.3 11/30/2017   Lab Results  Component Value Date   VD25OH 7.85 (L) 07/22/2020   VD25OH 17.8 (L) 11/11/2015    Lab Results  Component Value Date   PREALBUMIN 20.2 11/11/2015   CBC EXTENDED Latest Ref Rng & Units 09/27/2020 07/22/2020 04/18/2019  WBC 4.0 - 10.5 K/uL 12.7(H) 9.1 10.1  RBC 4.22 - 5.81 MIL/uL 4.25 4.60 4.62  HGB 13.0 - 17.0 g/dL 21.3 08.6 57.8  HCT 46.9 - 52.0 %  37.5(L) 40.8 39.8  PLT 150 - 400 K/uL 258 260.0 361  NEUTROABS 1.7 - 7.7 K/uL 10.2(H) 6.3 -  LYMPHSABS 0.7 - 4.0 K/uL 1.5 1.9 -     There is no height or weight on file to calculate BMI.  Orders:  No orders of the defined types were placed in this encounter.  No orders of the defined types were placed in this encounter.    Procedures: No procedures performed  Clinical Data: No additional findings.  ROS:  All other systems negative, except as noted in the HPI. Review of Systems  Objective: Vital Signs: There were no vitals taken for this visit.  Specialty Comments:  No specialty comments available.  PMFS History: Patient Active Problem List   Diagnosis Date Noted  . Fatigue 08/04/2020  . Gastroesophageal reflux disease without esophagitis 08/04/2020  . Below-knee  amputation of left lower extremity (HCC) 04/18/2019  . Dehiscence of incision, sequela   . H/O amputation of lesser toe, left (HCC) 04/04/2019  . Foreign body (FB) in soft tissue   . Pressure injury of left foot, stage 2 (HCC) 02/14/2019  . PVD (peripheral vascular disease) (HCC) 02/14/2019  . Snoring 02/14/2019  . S/P laparoscopic cholecystectomy 01/04/2019  . Intractable vomiting   . Sepsis (HCC) 10/29/2018  . Abnormal finding on imaging 10/29/2018  . Cholecystostomy care (HCC) 09/05/2018  . Essential hypertension 06/27/2018  . Hyperlipidemia 06/27/2018  . Diabetic retinopathy (HCC) 06/27/2018  . Leukocytosis 06/27/2018  . Ischemic stroke (HCC) - mult small B infarcts s/p tPA, unknown embolic source 06/25/2018  . Left arm numbness 03/03/2018  . Strain of right trapezius muscle 03/03/2018  . Ischemic cardiomyopathy 12/16/2017  . Major depressive disorder, recurrent episode, mild (HCC) 09/16/2017  . Acute on chronic systolic and diastolic heart failure, NYHA class 3 (HCC) 09/14/2017  . CAD in native artery 09/14/2017  . S/P BKA (below knee amputation) unilateral, left (HCC) 11/19/2016  . Short Achilles tendon (acquired), left ankle 11/19/2016  . Subacute osteomyelitis, left ankle and foot (HCC) 10/20/2016  . Gastroparesis due to DM (HCC)   . History of diabetic ulcer of foot 11/14/2015  . Vitamin D deficiency 11/12/2015  . Osteoporosis 11/12/2015  . GERD (gastroesophageal reflux disease) 11/03/2015  . Erectile dysfunction 07/11/2009  . Allergic rhinitis 12/11/2008  . 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 Insomnia 08/24/2007  . Controlled type 2 diabetes mellitus with diabetic autonomic neuropathy, with long-term current use of insulin (HCC) 08/09/2007   Past Medical History:  Diagnosis Date  . Acute on chronic systolic and diastolic heart failure, NYHA class 3 (HCC) 09/14/2017  . Acute osteomyelitis of metatarsal bone of  left foot (HCC) 11/12/2015  . CAD in native artery 09/14/2017  . Closed fracture of right tibial plateau 11/11/2015  . Coronary artery disease   . Dehiscence of amputation stump (HCC)    left leg  . Depression with anxiety   . Diabetes mellitus    INSULIN DEPENDENT Type II  . Diabetic peripheral neuropathy associated with type 2 diabetes mellitus (HCC) 08/30/2008   Qualifier: Diagnosis of  By: Daphine DeutscherMartin FNP, Zena AmosNykedtra    . Diabetic ulcer of left foot (HCC) 11/14/2015  . Gastroparesis   . GERD (gastroesophageal reflux disease)   . History of loop recorder 06/2018   Last remote device check was on 02/14/2019  . Hypertension    patient denies  . Left ventricular aneurysm    REPORTS HE IS NOT AWAREW OF THIS   .  Myocardial infarction (HCC)   . Osteomyelitis (HCC)   . Osteoporosis 11/12/2015  . Peripheral neuropathy    feet and below bilateral knees  . Peripheral vascular disease (HCC)    PAD in Left leg and some in right leg  . Shortness of breath dyspnea   . Stroke (HCC) 05/2018  . Vitamin D deficiency 11/12/2015    Family History  Problem Relation Age of Onset  . Cancer Paternal Grandmother   . COPD Mother   . Heart failure Mother   . Anxiety disorder Mother   . Depression Mother   . Fibromyalgia Mother   . Esophageal cancer Father        Smoker, still does  . Diabetes Sister   . Fibromyalgia Sister   . Diabetes Maternal Uncle   . Diabetes Maternal Grandmother   . GER disease Sister   . Liver disease Paternal Grandfather   . Hemachromatosis Paternal Grandfather   . Diabetes Maternal Grandfather     Past Surgical History:  Procedure Laterality Date  . AMPUTATION Left 11/28/2015   Procedure: Left 4th Ray Amputation;  Surgeon: Nadara Mustard, MD;  Location: [facility] OR;  Service: Orthopedics;  Laterality: Left;  . AMPUTATION Left 10/29/2016   Procedure: LEFT 5TH RAY AMPUTATION;  Surgeon: Nadara Mustard, MD;  Location: [facility] OR;  Service: Orthopedics;  Laterality: Left;  . AMPUTATION  Left 03/09/2019   Procedure: LEFT FOOT 5TH RAY AMPUTATION;  Surgeon: Nadara Mustard, MD;  Location: [facility] OR;  Service: Orthopedics;  Laterality: Left;  . AMPUTATION Left 04/18/2019   Procedure: LEFT BELOW KNEE AMPUTATION;  Surgeon: Nadara Mustard, MD;  Location: [facility] OR;  Service: Orthopedics;  Laterality: Left;  . ANTERIOR VITRECTOMY Left 04/12/2016   Procedure: ANTERIOR CHAMBER WASH OUT WITH GAS FLUID EXCHANGE;  Surgeon: Carmela Rima, MD;  Location: [facility] OR;  Service: Ophthalmology;  Laterality: Left;  . CARDIAC CATHETERIZATION  10/2017  . CATARACT EXTRACTION Left   . CHOLECYSTECTOMY N/A 01/04/2019   Procedure: LAPAROSCOPIC CHOLECYSTECTOMY;  Surgeon: Gaynelle Adu, MD;  Location: [facility] ORS;  Service: General;  Laterality: N/A;  . CORONARY ARTERY BYPASS GRAFT N/A 11/29/2017    LIMA-LAD, SVG-PDA, SVG-CFX  Procedure: CORONARY ARTERY BYPASS GRAFTING (CABG) times three using the left saphaneous vien. harvested endoscopicly and left internal mammary artery.;  Surgeon: Kerin Perna, MD;  Location: [facility] OR;  Service: Open Heart Surgery;  Laterality: N/A;  . EYE SURGERY     left eye surgery 04/2016  . IR CHOLANGIOGRAM EXISTING TUBE  10/17/2018  . IR CHOLANGIOGRAM EXISTING TUBE  10/30/2018  . IR PERC CHOLECYSTOSTOMY  09/05/2018  . KNEE SURGERY Left   . LOOP RECORDER INSERTION N/A 06/28/2018   Procedure: LOOP RECORDER INSERTION;  Surgeon: Duke Salvia, MD;  Location: [facility] INVASIVE CV LAB;  Service: Cardiovascular;  Laterality: N/A;  . RIGHT/LEFT HEART CATH AND CORONARY ANGIOGRAPHY N/A 10/19/2017   Procedure: RIGHT/LEFT HEART CATH AND CORONARY ANGIOGRAPHY;  Surgeon: Swaziland, [name], MD;  Location: [facility] INVASIVE CV LAB;  Service: Cardiovascular;  Laterality: N/A;  . SHOULDER SURGERY Right   . TEE WITHOUT CARDIOVERSION N/A 11/29/2017   Procedure: TRANSESOPHAGEAL ECHOCARDIOGRAM (TEE);  Surgeon: Donata Clay, Theron Arista, MD;  Location: [facility] OR;  Service: Open Heart Surgery;  Laterality: N/A;  . TEE WITHOUT CARDIOVERSION N/A  06/28/2018   Procedure: TRANSESOPHAGEAL ECHOCARDIOGRAM (TEE);  Surgeon: Jake Bathe, MD;  Location: [facility] ENDOSCOPY;  Service: Cardiovascular;  Laterality: N/A;  . TOE AMPUTATION Left 11/28/15   4th toe  Social History   Occupational History  . Occupation: Disabled  Tobacco Use  . Smoking status: Never Smoker  . Smokeless tobacco: Never Used  Vaping Use  . Vaping Use: Never used  Substance and Sexual Activity  . Alcohol use: No  . Drug use: No  . Sexual activity: Yes

## 2021-03-21 LAB — CUP PACEART REMOTE DEVICE CHECK
Date Time Interrogation Session: 20220403013112
Implantable Pulse Generator Implant Date: 20190717

## 2021-03-23 ENCOUNTER — Ambulatory Visit (INDEPENDENT_AMBULATORY_CARE_PROVIDER_SITE_OTHER): Payer: Medicare Other

## 2021-03-23 DIAGNOSIS — I639 Cerebral infarction, unspecified: Secondary | ICD-10-CM | POA: Diagnosis not present

## 2021-04-07 NOTE — Progress Notes (Signed)
[facility] Summary Report / Loop Recorder 

## 2021-04-15 ENCOUNTER — Ambulatory Visit: Payer: Medicare Other | Admitting: Endocrinology

## 2021-04-20 ENCOUNTER — Telehealth: Payer: Self-pay

## 2021-04-20 NOTE — Telephone Encounter (Signed)
error 

## 2021-04-27 ENCOUNTER — Ambulatory Visit (INDEPENDENT_AMBULATORY_CARE_PROVIDER_SITE_OTHER): Payer: Medicare Other

## 2021-04-27 DIAGNOSIS — I639 Cerebral infarction, unspecified: Secondary | ICD-10-CM

## 2021-04-28 LAB — CUP PACEART REMOTE DEVICE CHECK
Date Time Interrogation Session: 20220514231128
Implantable Pulse Generator Implant Date: 20190717

## 2021-05-20 NOTE — Progress Notes (Signed)
[facility] Summary Report / Loop Recorder 

## 2021-06-01 ENCOUNTER — Ambulatory Visit (INDEPENDENT_AMBULATORY_CARE_PROVIDER_SITE_OTHER): Payer: Medicare Other

## 2021-06-01 DIAGNOSIS — I255 Ischemic cardiomyopathy: Secondary | ICD-10-CM

## 2021-06-02 LAB — CUP PACEART REMOTE DEVICE CHECK
Date Time Interrogation Session: 20220616232151
Implantable Pulse Generator Implant Date: 20190717

## 2021-06-22 NOTE — Progress Notes (Signed)
[facility] Summary Report / Loop Recorder 

## 2021-07-02 LAB — CUP PACEART REMOTE DEVICE CHECK
Date Time Interrogation Session: 20220719233246
Implantable Pulse Generator Implant Date: 20190717

## 2021-07-06 ENCOUNTER — Ambulatory Visit (INDEPENDENT_AMBULATORY_CARE_PROVIDER_SITE_OTHER): Payer: Medicare Other

## 2021-07-06 DIAGNOSIS — I255 Ischemic cardiomyopathy: Secondary | ICD-10-CM | POA: Diagnosis not present

## 2021-07-31 NOTE — Progress Notes (Signed)
[facility] Summary Report / Loop Recorder 

## 2021-08-10 ENCOUNTER — Ambulatory Visit (INDEPENDENT_AMBULATORY_CARE_PROVIDER_SITE_OTHER): Payer: Medicare Other

## 2021-08-10 DIAGNOSIS — I255 Ischemic cardiomyopathy: Secondary | ICD-10-CM | POA: Diagnosis not present

## 2021-08-11 LAB — CUP PACEART REMOTE DEVICE CHECK
Date Time Interrogation Session: 20220822000805
Implantable Pulse Generator Implant Date: 20190717

## 2021-08-21 NOTE — Progress Notes (Signed)
[facility] Summary Report / Loop Recorder 

## 2021-09-14 ENCOUNTER — Ambulatory Visit (INDEPENDENT_AMBULATORY_CARE_PROVIDER_SITE_OTHER): Payer: Medicare Other

## 2021-09-14 DIAGNOSIS — I255 Ischemic cardiomyopathy: Secondary | ICD-10-CM | POA: Diagnosis not present

## 2021-09-14 LAB — CUP PACEART REMOTE DEVICE CHECK
Date Time Interrogation Session: 20220924000711
Implantable Pulse Generator Implant Date: 20190717

## 2021-09-18 ENCOUNTER — Ambulatory Visit: Payer: Medicare Other | Admitting: Endocrinology

## 2021-09-22 ENCOUNTER — Ambulatory Visit: Payer: Medicare Other | Admitting: Endocrinology

## 2021-09-22 NOTE — Progress Notes (Signed)
[facility] Summary Report / Loop Recorder 

## 2021-10-06 ENCOUNTER — Telehealth: Payer: Self-pay

## 2021-10-06 NOTE — Telephone Encounter (Signed)
LINQ alert received. Device has reached RRT 10/24 Route to triage LR  Successful telephone encounter to patient's guardian/sister Lanice Schwab to discuss linq at RRT. Discussed options for removal vs leaving in place. Sister will discuss with patient and undate device clinic at [phone]. Instructed sister to unplug remote monitor. Address confirmed for return kit. Remote monitoring appointments in Epic discontinued, patient marked inactive in paceart and discontinued in [facility]. Will await call back for removal appt scheduling.

## 2021-10-26 ENCOUNTER — Other Ambulatory Visit: Payer: Self-pay | Admitting: Family

## 2021-12-11 ENCOUNTER — Ambulatory Visit: Payer: Medicare Other | Admitting: Family

## 2021-12-11 ENCOUNTER — Telehealth: Payer: Self-pay | Admitting: Family

## 2021-12-11 NOTE — Telephone Encounter (Signed)
Called and left pt a message to reschedule appt. PA Denny Peon will not be in office. Please reschedule for 12/15/21 and let Lauren F know to open slot

## 2021-12-16 ENCOUNTER — Ambulatory Visit (INDEPENDENT_AMBULATORY_CARE_PROVIDER_SITE_OTHER): Payer: Medicare Other | Admitting: Family

## 2021-12-16 ENCOUNTER — Encounter: Payer: Self-pay | Admitting: Family

## 2021-12-16 ENCOUNTER — Ambulatory Visit: Payer: Self-pay

## 2021-12-16 DIAGNOSIS — M25531 Pain in right wrist: Secondary | ICD-10-CM

## 2021-12-16 NOTE — Progress Notes (Signed)
Office Visit Note   Patient: Travis [name]           Date of Birth: 10-20-1971           MRN: [id number] Visit Date: 12/16/2021              Requested by: Brunetta Jeans, PA-C [place],  [place] [address] PCP: Brunetta Jeans, [place]  Chief Complaint  Patient presents with   Left Leg - Follow-up    Hx of left BKA       HPI: The patient is a 51 year old gentleman who presents today with concerns of injuries to his right wrist.  He states that in the beginning of November he was bit by his dog his dog also jumped and landed on his wrist more than 1 time he does have significant issues with neuropathy the wounds from the dog bite have since healed did not think the dog jumping on his wrist cause any significant injury he subsequently fell in the bathtub and caught himself and what sounds like a fall on outstretched hand type of injury had significant pain after this this injury was 1 month ago.  Unfortunately in the subsequent weeks he and his family had COVID he delayed presenting for evaluation  He has tried using a wrist brace from [facility] this has been quite painful for him to wear  Assessment & Plan: Visit Diagnoses:  1. Pain in right wrist     Plan: Wrist fracture with significant impaction reviewed films with Dr. Erlinda Hong.  We will send him for an urgent CT scan he will follow-up with Dr. Erlinda Hong to discuss his surgical options.  Placed in a Velcro wrist splint today.  Follow-Up Instructions: Return ct review with xu.   Right Hand Exam   Tenderness  The patient is experiencing tenderness in the radial area, dorsal area and palmar area.  Muscle Strength  Grip: 2/5   Other  Erythema: absent Scars: present  Comments:  Significant stiffness with flexion extension pronation and supination. healed scars from what appeared to be an animal bite.  There is no open wound today.     Patient is alert, oriented, no adenopathy, well-dressed, normal  affect, normal respiratory effort.   Imaging: No results found. No images are attached to the encounter.  Labs: Lab Results  Component Value Date   HGBA1C 9.7 (A) 02/11/2021   HGBA1C 8.5 (A) 11/12/2020   HGBA1C 9.0 (A) 09/24/2020   ESRSEDRATE 1 11/09/2016   ESRSEDRATE 24 (H) 10/06/2016   CRP 0.6 11/09/2016   CRP 6.3 (H) 10/06/2016   REPTSTATUS 11/03/2018 FINAL 10/29/2018   REPTSTATUS 11/03/2018 FINAL 10/29/2018   CULT  10/29/2018    NO GROWTH 5 DAYS Performed at [facility], [place] [address] [place], [place] [address]    CULT  10/29/2018    NO GROWTH 5 DAYS Performed at [place] [address] [place], [place] [address]      Lab Results  Component Value Date   ALBUMIN 3.9 09/27/2020   ALBUMIN 4.2 07/22/2020   ALBUMIN 3.5 04/18/2019   PREALBUMIN 20.2 11/11/2015    Lab Results  Component Value Date   MG 2.0 11/01/2018   MG 2.1 11/30/2017   MG 2.3 11/30/2017   Lab Results  Component Value Date   VD25OH 7.85 (L) 07/22/2020   VD25OH 17.8 (L) 11/11/2015    Lab Results  Component Value Date   PREALBUMIN 20.2  11/11/2015   CBC EXTENDED Latest Ref Rng & Units 09/27/2020 07/22/2020 04/18/2019  WBC 4.0 - 10.5 K/uL 12.7(H) 9.1 10.1  RBC 4.22 - 5.81 MIL/uL 4.25 4.60 4.62  HGB 13.0 - 17.0 g/dL 13.0 13.9 13.4  HCT 39.0 - 52.0 % 37.5(L) 40.8 39.8  PLT 150 - 400 K/uL 258 260.0 361  NEUTROABS 1.7 - 7.7 K/uL 10.2(H) 6.3 -  LYMPHSABS 0.7 - 4.0 K/uL 1.5 1.9 -     There is no height or weight on file to calculate BMI.  Orders:  Orders Placed This Encounter  Procedures   XR Wrist Complete Right   CT WRIST RIGHT WO CONTRAST   No orders of the defined types were placed in this encounter.    Procedures: No procedures performed  Clinical Data: No additional findings.  ROS:  All other systems negative, except as noted in the HPI. Review of Systems  Constitutional:  Negative for chills and fever.  Musculoskeletal:  Positive for arthralgias and  joint swelling.  Neurological:  Positive for numbness. Negative for weakness.   Objective: Vital Signs: There were no vitals taken for this visit.  Specialty Comments:  No specialty comments available.  PMFS History: Patient Active Problem List   Diagnosis Date Noted   Fatigue 08/04/2020   Gastroesophageal reflux disease without esophagitis 08/04/2020   Below-knee amputation of left lower extremity ([place]) 04/18/2019   Dehiscence of incision, sequela    H/O amputation of lesser toe, left ([place]) 04/04/2019   Foreign body (FB) in soft tissue    Pressure injury of left foot, stage 2 ([place]) 02/14/2019   PVD (peripheral vascular disease) ([place]) 02/14/2019   Snoring 02/14/2019   S/P laparoscopic cholecystectomy 01/04/2019   Intractable vomiting    Sepsis ([place]) 10/29/2018   Abnormal finding on imaging 10/29/2018   Cholecystostomy care ([place]) 09/05/2018   Essential hypertension 06/27/2018   Hyperlipidemia 06/27/2018   Diabetic retinopathy ([place]) 06/27/2018   Leukocytosis 06/27/2018   Ischemic stroke ([place]) - mult small B infarcts s/p tPA, unknown embolic source 0000000   Left arm numbness 03/03/2018   Strain of right trapezius muscle 03/03/2018   Ischemic cardiomyopathy 12/16/2017   Major depressive disorder, recurrent episode, mild ([place]) 09/16/2017   Acute on chronic systolic and diastolic heart failure, NYHA class 3 ([place]) 09/14/2017   CAD in native artery 09/14/2017   S/P BKA (below knee amputation) unilateral, left ([place]) 11/19/2016   Short Achilles tendon (acquired), left ankle 11/19/2016   Subacute osteomyelitis, left ankle and foot ([place]) 10/20/2016   Gastroparesis due to DM ([place])    History of diabetic ulcer of foot 11/14/2015   Vitamin D deficiency 11/12/2015   Osteoporosis 11/12/2015   GERD (gastroesophageal reflux disease) 11/03/2015   Erectile dysfunction 07/11/2009   Allergic rhinitis 12/11/2008   Diabetic peripheral neuropathy associated with type 2 diabetes mellitus  ([place]) 08/30/2008   Insomnia 08/24/2007   Controlled type 2 diabetes mellitus with diabetic autonomic neuropathy, with long-term current use of insulin ([place]) 08/09/2007   Past Medical History:  Diagnosis Date   Acute on chronic systolic and diastolic heart failure, NYHA class 3 ([place]) 09/14/2017   Acute osteomyelitis of metatarsal bone of left foot ([place]) 11/12/2015   CAD in native artery 09/14/2017   Closed fracture of right tibial plateau 11/11/2015   Coronary artery disease    Dehiscence of amputation stump ([place])    left leg   Depression with anxiety    Diabetes mellitus    INSULIN DEPENDENT Type II  Diabetic peripheral neuropathy associated with type 2 diabetes mellitus ([place]) 08/30/2008   Qualifier: Diagnosis of  By: [name] Done FNP, Nykedtra     Diabetic ulcer of left foot ([place]) 11/14/2015   Gastroparesis    GERD (gastroesophageal reflux disease)    History of loop recorder 06/2018   Last remote device check was on 02/14/2019   Hypertension    patient denies   Left ventricular aneurysm    REPORTS HE IS NOT AWAREW OF THIS    Myocardial infarction (HCC)    Osteomyelitis ([place])    Osteoporosis 11/12/2015   Peripheral neuropathy    feet and below bilateral knees   Peripheral vascular disease (HCC)    PAD in Left leg and some in right leg   Shortness of breath dyspnea    Stroke ([place]) 05/2018   Vitamin D deficiency 11/12/2015    Family History  Problem Relation Age of Onset   Cancer Paternal Grandmother    COPD Mother    Heart failure Mother    Anxiety disorder Mother    Depression Mother    Fibromyalgia Mother    Esophageal cancer Father        Smoker, still does   Diabetes Sister    Fibromyalgia Sister    Diabetes Maternal Uncle    Diabetes Maternal Grandmother    GER disease Sister    Liver disease Paternal Grandfather    Hemachromatosis Paternal Grandfather    Diabetes Maternal Grandfather     Past Surgical History:  Procedure Laterality Date   AMPUTATION Left  11/28/2015   Procedure: Left 4th Ray Amputation;  Surgeon: [name], MD;  Location: [place];  Service: Orthopedics;  Laterality: Left;   AMPUTATION Left 10/29/2016   Procedure: LEFT 5TH RAY AMPUTATION;  Surgeon: [name], MD;  Location: [place];  Service: Orthopedics;  Laterality: Left;   AMPUTATION Left 03/09/2019   Procedure: LEFT FOOT 5TH RAY AMPUTATION;  Surgeon: [name], MD;  Location: [place];  Service: Orthopedics;  Laterality: Left;   AMPUTATION Left 04/18/2019   Procedure: LEFT BELOW KNEE AMPUTATION;  Surgeon: [name], MD;  Location: [place];  Service: Orthopedics;  Laterality: Left;   ANTERIOR VITRECTOMY Left 04/12/2016   Procedure: ANTERIOR CHAMBER [place] OUT WITH GAS FLUID EXCHANGE;  Surgeon: [name], MD;  Location: [place];  Service: Ophthalmology;  Laterality: Left;   CARDIAC CATHETERIZATION  10/2017   CATARACT EXTRACTION Left    CHOLECYSTECTOMY N/A 01/04/2019   Procedure: LAPAROSCOPIC CHOLECYSTECTOMY;  Surgeon: [name], MD;  Location: [facility] ORS;  Service: General;  Laterality: N/A;   CORONARY ARTERY BYPASS GRAFT N/A 11/29/2017    LIMA-LAD, SVG-PDA, SVG-CFX  Procedure: CORONARY ARTERY BYPASS GRAFTING (CABG) times three using the left saphaneous vien. harvested endoscopicly and left internal mammary artery.;  Surgeon: [name], MD;  Location: [place];  Service: Open Heart Surgery;  Laterality: N/A;   EYE SURGERY     left eye surgery 04/2016   IR CHOLANGIOGRAM EXISTING TUBE  10/17/2018   IR CHOLANGIOGRAM EXISTING TUBE  10/30/2018   IR PERC CHOLECYSTOSTOMY  09/05/2018   KNEE SURGERY Left    LOOP RECORDER INSERTION N/A 06/28/2018   Procedure: LOOP RECORDER INSERTION;  Surgeon: Deboraha Sprang, MD;  Location: [place] CV LAB;  Service: Cardiovascular;  Laterality: N/A;   RIGHT/LEFT HEART CATH AND CORONARY ANGIOGRAPHY N/A 10/19/2017   Procedure: RIGHT/LEFT HEART CATH AND CORONARY ANGIOGRAPHY;  Surgeon: [name], [name], MD;  Location: [place] CV  LAB;  Service:  Cardiovascular;  Laterality: N/A;   SHOULDER SURGERY Right    TEE WITHOUT CARDIOVERSION N/A 11/29/2017   Procedure: TRANSESOPHAGEAL ECHOCARDIOGRAM (TEE);  Surgeon: [name], Collier Salina, MD;  Location: [place];  Service: Open Heart Surgery;  Laterality: N/A;   TEE WITHOUT CARDIOVERSION N/A 06/28/2018   Procedure: TRANSESOPHAGEAL ECHOCARDIOGRAM (TEE);  Surgeon: [name], MD;  Location: [facility] ENDOSCOPY;  Service: Cardiovascular;  Laterality: N/A;   TOE AMPUTATION Left 11/28/15   4th toe   Social History   Occupational History   Occupation: Disabled  Tobacco Use   Smoking status: Never   Smokeless tobacco: Never  Vaping Use   Vaping Use: Never used  Substance and Sexual Activity   Alcohol use: No   Drug use: No   Sexual activity: Yes

## 2021-12-17 ENCOUNTER — Ambulatory Visit
Admission: RE | Admit: 2021-12-17 | Discharge: 2021-12-17 | Disposition: A | Discharge status: Home / Self Care | Payer: Medicare Other | Source: Ambulatory Visit | Attending: Family | Admitting: Family

## 2021-12-17 DIAGNOSIS — M25531 Pain in right wrist: Secondary | ICD-10-CM

## 2021-12-18 ENCOUNTER — Telehealth: Payer: Self-pay | Admitting: Orthopaedic Surgery

## 2021-12-18 NOTE — Telephone Encounter (Signed)
Called pt 1X and left vm to call and set appt with Dr. Roda Shutters for CT Scan review after 12/17/21

## 2021-12-21 ENCOUNTER — Telehealth: Payer: Self-pay | Admitting: Orthopaedic Surgery

## 2021-12-21 NOTE — Telephone Encounter (Signed)
Called pt 2X and left vm for pt to call and set an appt for MRI review which pt had 12/17/21

## 2021-12-22 ENCOUNTER — Ambulatory Visit: Payer: Medicare Other | Admitting: Family

## 2021-12-22 ENCOUNTER — Telehealth: Payer: Self-pay | Admitting: Family

## 2021-12-22 NOTE — Telephone Encounter (Signed)
Pt called about his CT scan results    CB [phone]

## 2021-12-22 NOTE — Telephone Encounter (Signed)
Please advise 

## 2021-12-22 NOTE — Telephone Encounter (Signed)
Can you offer follow up appt with xu

## 2022-01-12 ENCOUNTER — Other Ambulatory Visit: Payer: Self-pay

## 2022-01-12 ENCOUNTER — Encounter: Payer: Self-pay | Admitting: Orthopaedic Surgery

## 2022-01-12 ENCOUNTER — Ambulatory Visit (INDEPENDENT_AMBULATORY_CARE_PROVIDER_SITE_OTHER): Payer: Medicare Other | Admitting: Orthopaedic Surgery

## 2022-01-12 DIAGNOSIS — M25531 Pain in right wrist: Secondary | ICD-10-CM

## 2022-01-12 NOTE — Progress Notes (Signed)
Office Visit Note   Patient: Travis [name]           Date of Birth: [birth year]           MRN: [id number] Visit Date: 01/12/2022              Requested by: No referring provider defined for this encounter. PCP: Pcp, No   Assessment & Plan: Visit Diagnoses:  1. Pain in right wrist     Plan: Mr. Brueggemann follows up today for CT scan of the right wrist.  He has a referral from hearing for chronic right wrist pain.  Examination of the right wrist shows healed traumatic scars.  He has slight volar translation of the hand in relation to the forearm.  He has intrinsic muscle atrophy of the hand.  Decreased range of motion of the wrist secondary to pain.  The CT scan shows diffuse erosive arthropathy with volar subluxation of the radial carpal joint.  This appears to be chronic and degenerative and consistent with Charcot arthropathy.  These findings were reviewed with Mr. Hegewald and his wife in detail and treatment options were discussed.  They would like to have a formal consultation with Dr. Tempie Donning to explore surgical options.  He will make an appointment to see Dr. Tempie Donning in the near future.  We will see him back as needed.  Follow-Up Instructions: No follow-ups on file.   Orders:  No orders of the defined types were placed in this encounter.  No orders of the defined types were placed in this encounter.     Procedures: No procedures performed   Clinical Data: No additional findings.   Subjective: Chief Complaint  Patient presents with   Right Wrist - Follow-up    CT review    HPI  Review of Systems   Objective: Vital Signs: There were no vitals taken for this visit.  Physical Exam  Ortho Exam  Specialty Comments:  No specialty comments available.  Imaging: No results found.   PMFS History: Patient Active Problem List   Diagnosis Date Noted   Fatigue 08/04/2020   Gastroesophageal reflux disease without esophagitis 08/04/2020   Below-knee  amputation of left lower extremity ([place]) 04/18/2019   Dehiscence of incision, sequela    H/O amputation of lesser toe, left ([place]) 04/04/2019   Foreign body (FB) in soft tissue    Pressure injury of left foot, stage 2 ([place]) 02/14/2019   PVD (peripheral vascular disease) ([place]) 02/14/2019   Snoring 02/14/2019   S/P laparoscopic cholecystectomy 01/04/2019   Intractable vomiting    Sepsis ([place]) 10/29/2018   Abnormal finding on imaging 10/29/2018   Cholecystostomy care ([place]) 09/05/2018   Essential hypertension 06/27/2018   Hyperlipidemia 06/27/2018   Diabetic retinopathy ([place]) 06/27/2018   Leukocytosis 06/27/2018   Ischemic stroke ([place]) - mult small B infarcts s/p tPA, unknown embolic source 0000000   Left arm numbness 03/03/2018   Strain of right trapezius muscle 03/03/2018   Ischemic cardiomyopathy 12/16/2017   Major depressive disorder, recurrent episode, mild ([place]) 09/16/2017   Acute on chronic systolic and diastolic heart failure, NYHA class 3 ([place]) 09/14/2017   CAD in native artery 09/14/2017   S/P BKA (below knee amputation) unilateral, left ([place]) 11/19/2016   Short Achilles tendon (acquired), left ankle 11/19/2016   Subacute osteomyelitis, left ankle and foot ([place]) 10/20/2016   Gastroparesis due to DM ([place])    History of diabetic ulcer of foot 11/14/2015   Vitamin D deficiency 11/12/2015   Osteoporosis  11/12/2015   GERD (gastroesophageal reflux disease) 11/03/2015   Erectile dysfunction 07/11/2009   Allergic rhinitis 12/11/2008   Diabetic peripheral neuropathy associated with type 2 diabetes mellitus ([place]) 08/30/2008   Insomnia 08/24/2007   Controlled type 2 diabetes mellitus with diabetic autonomic neuropathy, with long-term current use of insulin ([place]) 08/09/2007   Past Medical History:  Diagnosis Date   Acute on chronic systolic and diastolic heart failure, NYHA class 3 ([place]) 09/14/2017   Acute osteomyelitis of metatarsal bone of left foot ([place]) 11/12/2015   CAD in  native artery 09/14/2017   Closed fracture of right tibial plateau 11/11/2015   Coronary artery disease    Dehiscence of amputation stump (HCC)    left leg   Depression with anxiety    Diabetes mellitus    INSULIN DEPENDENT Type II   Diabetic peripheral neuropathy associated with type 2 diabetes mellitus ([place]) 08/30/2008   Qualifier: Diagnosis of  By: [name] Done FNP, Nykedtra     Diabetic ulcer of left foot ([place]) 11/14/2015   Gastroparesis    GERD (gastroesophageal reflux disease)    History of loop recorder 06/2018   Last remote device check was on 02/14/2019   Hypertension    patient denies   Left ventricular aneurysm    REPORTS HE IS NOT AWAREW OF THIS    Myocardial infarction (HCC)    Osteomyelitis ([place])    Osteoporosis 11/12/2015   Peripheral neuropathy    feet and below bilateral knees   Peripheral vascular disease (HCC)    PAD in Left leg and some in right leg   Shortness of breath dyspnea    Stroke ([place]) 05/2018   Vitamin D deficiency 11/12/2015    Family History  Problem Relation Age of Onset   Cancer Paternal Grandmother    COPD Mother    Heart failure Mother    Anxiety disorder Mother    Depression Mother    Fibromyalgia Mother    Esophageal cancer Father        Smoker, still does   Diabetes Sister    Fibromyalgia Sister    Diabetes Maternal Uncle    Diabetes Maternal Grandmother    GER disease Sister    Liver disease Paternal Grandfather    Hemachromatosis Paternal Grandfather    Diabetes Maternal Grandfather     Past Surgical History:  Procedure Laterality Date   AMPUTATION Left 11/28/2015   Procedure: Left 4th Ray Amputation;  Surgeon: [name], MD;  Location: [facility];  Service: Orthopedics;  Laterality: Left;   AMPUTATION Left 10/29/2016   Procedure: LEFT 5TH RAY AMPUTATION;  Surgeon: [name], MD;  Location: [facility];  Service: Orthopedics;  Laterality: Left;   AMPUTATION Left 03/09/2019   Procedure: LEFT FOOT 5TH RAY AMPUTATION;  Surgeon: [name], MD;  Location: [facility];  Service: Orthopedics;  Laterality: Left;   AMPUTATION Left 04/18/2019   Procedure: LEFT BELOW KNEE AMPUTATION;  Surgeon: [name], MD;  Location: [facility] [facility];  Service: Orthopedics;  Laterality: Left;   ANTERIOR VITRECTOMY Left 04/12/2016   Procedure: ANTERIOR CHAMBER [place] OUT WITH GAS FLUID EXCHANGE;  Surgeon: [name], MD;  Location: [facility];  Service: Ophthalmology;  Laterality: Left;   CARDIAC CATHETERIZATION  10/2017   CATARACT EXTRACTION Left    CHOLECYSTECTOMY N/A 01/04/2019   Procedure: LAPAROSCOPIC CHOLECYSTECTOMY;  Surgeon: [name], MD;  [facility];  Service: General;  Laterality: N/A;   CORONARY ARTERY BYPASS GRAFT N/A 11/29/2017    LIMA-LAD,  SVG-PDA, SVG-CFX  Procedure: CORONARY ARTERY BYPASS GRAFTING (CABG) times three using the left saphaneous vien. harvested endoscopicly and left internal mammary artery.;  Surgeon: [name], MD;  Location: [facility];  Service: Open Heart Surgery;  Laterality: N/A;   EYE SURGERY     left eye surgery 04/2016   IR CHOLANGIOGRAM EXISTING TUBE  10/17/2018   IR CHOLANGIOGRAM EXISTING TUBE  10/30/2018   IR PERC CHOLECYSTOSTOMY  09/05/2018   KNEE SURGERY Left    LOOP RECORDER INSERTION N/A 06/28/2018   Procedure: LOOP RECORDER INSERTION;  Surgeon: Deboraha Sprang, MD;  Location: [facility];  Service: Cardiovascular;  Laterality: N/A;   RIGHT/LEFT HEART CATH AND CORONARY ANGIOGRAPHY N/A 10/19/2017   Procedure: RIGHT/LEFT HEART CATH AND CORONARY ANGIOGRAPHY;  Surgeon: [name], [name], MD;  Location: [facility];  Service: Cardiovascular;  Laterality: N/A;   SHOULDER SURGERY Right    TEE WITHOUT CARDIOVERSION N/A 11/29/2017   Procedure: TRANSESOPHAGEAL ECHOCARDIOGRAM (TEE);  Surgeon: [name], Collier Salina, MD;  Location: [facility];  Service: Open Heart Surgery;  Laterality: N/A;   TEE WITHOUT CARDIOVERSION N/A 06/28/2018   Procedure: TRANSESOPHAGEAL ECHOCARDIOGRAM (TEE);  Surgeon: [name], MD;   Location: [facility];  Service: Cardiovascular;  Laterality: N/A;   TOE AMPUTATION Left 11/28/15   4th toe   Social History   Occupational History   Occupation: Disabled  Tobacco Use   Smoking status: Never   Smokeless tobacco: Never  Vaping Use   Vaping Use: Never used  Substance and Sexual Activity   Alcohol use: No   Drug use: No   Sexual activity: Yes

## 2022-01-19 ENCOUNTER — Ambulatory Visit: Payer: Medicare Other | Admitting: Orthopedic Surgery

## 2022-01-22 ENCOUNTER — Ambulatory Visit: Payer: Medicare Other | Admitting: Orthopedic Surgery

## 2022-01-28 ENCOUNTER — Other Ambulatory Visit: Payer: Self-pay

## 2022-01-28 ENCOUNTER — Encounter: Payer: Self-pay | Admitting: Orthopedic Surgery

## 2022-01-28 ENCOUNTER — Ambulatory Visit (INDEPENDENT_AMBULATORY_CARE_PROVIDER_SITE_OTHER): Payer: Medicare Other | Admitting: Orthopedic Surgery

## 2022-01-28 VITALS — BP 115/70 | HR 68

## 2022-01-28 DIAGNOSIS — M19031 Primary osteoarthritis, right wrist: Secondary | ICD-10-CM | POA: Insufficient documentation

## 2022-01-28 DIAGNOSIS — M19131 Post-traumatic osteoarthritis, right wrist: Secondary | ICD-10-CM

## 2022-01-28 DIAGNOSIS — S52501A Unspecified fracture of the lower end of right radius, initial encounter for closed fracture: Secondary | ICD-10-CM | POA: Insufficient documentation

## 2022-01-28 DIAGNOSIS — S52571A Other intraarticular fracture of lower end of right radius, initial encounter for closed fracture: Secondary | ICD-10-CM

## 2022-01-28 DIAGNOSIS — E1142 Type 2 diabetes mellitus with diabetic polyneuropathy: Secondary | ICD-10-CM | POA: Diagnosis not present
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
## 2022-02-08 ENCOUNTER — Telehealth: Payer: Self-pay | Admitting: Orthopedic Surgery

## 2022-02-08 ENCOUNTER — Telehealth: Payer: Self-pay

## 2022-02-08 NOTE — Telephone Encounter (Signed)
Please contact pt to schedule a F/U to discuss medication change per Everardo All  Thank you   PK/CMA

## 2022-02-08 NOTE — Telephone Encounter (Signed)
Please reach out to patients sister Francesca Jewett in regards to his custom wrist splint!

## 2022-02-11 ENCOUNTER — Other Ambulatory Visit: Payer: Self-pay

## 2022-02-11 ENCOUNTER — Ambulatory Visit (INDEPENDENT_AMBULATORY_CARE_PROVIDER_SITE_OTHER): Payer: Medicare Other | Admitting: Endocrinology

## 2022-02-11 VITALS — BP 114/64 | HR 78 | Ht 74.0 in | Wt 250.8 lb

## 2022-02-11 DIAGNOSIS — E1143 Type 2 diabetes mellitus with diabetic autonomic (poly)neuropathy: Secondary | ICD-10-CM | POA: Diagnosis not present

## 2022-02-11 DIAGNOSIS — Z794 Long term (current) use of insulin: Secondary | ICD-10-CM | POA: Diagnosis not present

## 2022-02-11 LAB — POCT GLYCOSYLATED HEMOGLOBIN (HGB A1C): Hemoglobin A1C: 11.6 % — AB (ref 4.0–5.6)

## 2022-02-11 MED ORDER — FREESTYLE LIBRE 2 SENSOR MISC: 1.0000 | 3 refills | Status: DC

## 2022-02-11 MED ORDER — LANTUS SOLOSTAR 100 UNIT/ML ~~LOC~~ SOPN: 70.0000 [IU] | PEN_INJECTOR | SUBCUTANEOUS | 3 refills | Status: DC

## 2022-02-11 NOTE — Patient Instructions (Addendum)
check your blood sugar twice a day.  vary the time of day when you check, between before the 3 meals, and at bedtime.  also check if you have symptoms of your blood sugar being too high or too low.  please keep a record of the readings and bring it to your next appointment here (or you can bring the meter itself).  You can write it on any piece of paper.  please call us sooner if your blood sugar goes below 70, or if you have a lot of readings over 200.   ?please increase the Lantus to 70 units each morning.   ?On this type of insulin schedule, you should eat meals on a regular schedule.  If a meal is missed or significantly delayed, your blood sugar could go low.   ?I have sent a prescription to a mail order medical supplier, for the continuous glucose monitor.   ?Please come back for a follow-up appointment in 1 month.   ?

## 2022-02-11 NOTE — Progress Notes (Signed)
° °Subjective:  ° ° Patient ID: Travis [name], male    DOB: 09/04/1971, 51 y.o.   MRN: [id number] ° °HPI °Pt returns for f/u of diabetes mellitus:  °DM type: 1 °Dx'ed: 2021 °Complications: DR, CAD, PN, stage 3b CRI, GP, DN, and Left BKA.   °Therapy: insulin since 2015.   °DKA: 2017 °Severe hypoglycemia: never °Pancreatitis: never °Pancreatic imaging: normal on 2021 CT °SDOH: rx is limited by Noncompliance with f/u.   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  He is unable to check cbg.  He is here with wife.  Pt cannot do cbg's, due to PPN.   °Past Medical History:  °Diagnosis Date  ° Acute on chronic systolic and diastolic heart failure, NYHA class 3 (HCC) 09/14/2017  ° Acute osteomyelitis of metatarsal bone of left foot (HCC) 11/12/2015  ° CAD in native artery 09/14/2017  ° Closed fracture of right tibial plateau 11/11/2015  ° Coronary artery disease   ° Dehiscence of amputation stump (HCC)   ° left leg  ° Depression with anxiety   ° Diabetes mellitus   ° INSULIN DEPENDENT Type II  ° Diabetic peripheral neuropathy associated with type 2 diabetes mellitus (HCC) 08/30/2008  ° Qualifier: Diagnosis of  By: Martin FNP, [place]    ° Diabetic ulcer of left foot (HCC) 11/14/2015  ° Gastroparesis   ° GERD (gastroesophageal reflux disease)   ° History of loop recorder 06/2018  ° Last remote device check was on 02/14/2019  ° Hypertension   ° patient denies  ° Left ventricular aneurysm   ° REPORTS HE IS NOT AWAREW OF THIS   ° Myocardial infarction (HCC)   ° Osteomyelitis (HCC)   ° Osteoporosis 11/12/2015  ° Peripheral neuropathy   ° feet and below bilateral knees  ° Peripheral vascular disease (HCC)   ° PAD in Left leg and some in right leg  ° Shortness of breath dyspnea   ° Stroke (HCC) 05/2018  ° Vitamin D deficiency 11/12/2015   ° ° °Past Surgical History:  °Procedure Laterality Date  ° AMPUTATION Left 11/28/2015  ° Procedure: Left 4th Ray Amputation;  Surgeon: Marcus Duda V, MD;  [facility];  Service: Orthopedics;  Laterality: Left;  ° AMPUTATION Left 10/29/2016  ° Procedure: LEFT 5TH RAY AMPUTATION;  Surgeon: Marcus V Duda, MD;  [facility];  Service: Orthopedics;  Laterality: Left;  ° AMPUTATION Left 03/09/2019  ° Procedure: LEFT FOOT 5TH RAY AMPUTATION;  Surgeon: Duda, Marcus V, MD;  [facility];  Service: Orthopedics;  Laterality: Left;  ° AMPUTATION Left 04/18/2019  ° Procedure: LEFT BELOW KNEE AMPUTATION;  Surgeon: Duda, Marcus V, MD;  [facility];  Service: Orthopedics;  Laterality: Left;  ° ANTERIOR VITRECTOMY Left 04/12/2016  ° Procedure: ANTERIOR CHAMBER WASH OUT WITH GAS FLUID EXCHANGE;  Surgeon: Narendra Patel, MD;  [facility];  Service: Ophthalmology;  Laterality: Left;  ° CARDIAC CATHETERIZATION  10/2017  ° CATARACT EXTRACTION Left   ° CHOLECYSTECTOMY N/A 01/04/2019  ° Procedure: LAPAROSCOPIC CHOLECYSTECTOMY;  Surgeon: Wilson, Eric, MD;  [facility];  Service: General;  Laterality: N/A;  ° CORONARY ARTERY BYPASS GRAFT N/A 11/29/2017  °  LIMA-LAD, SVG-PDA, SVG-CFX  Procedure: CORONARY ARTERY BYPASS GRAFTING (CABG) times three using the left saphaneous vien.   harvested endoscopicly and left internal mammary artery.;  Surgeon: Van Trigt, Peter, MD;  [facility];  Service: Open Heart Surgery;  Laterality: N/A;  ° EYE SURGERY    ° left eye surgery 04/2016  ° IR CHOLANGIOGRAM EXISTING TUBE  10/17/2018  ° IR CHOLANGIOGRAM EXISTING TUBE  10/30/2018  ° IR PERC CHOLECYSTOSTOMY  09/05/2018  ° KNEE SURGERY Left   ° LOOP RECORDER INSERTION N/A 06/28/2018  ° Procedure: LOOP RECORDER INSERTION;  Surgeon: Klein, Steven C, MD;  [facility];  Service: Cardiovascular;  Laterality: N/A;  ° RIGHT/LEFT HEART CATH AND CORONARY ANGIOGRAPHY N/A 10/19/2017  ° Procedure: RIGHT/LEFT HEART CATH AND CORONARY  ANGIOGRAPHY;  Surgeon: Jordan, [name], MD;  [facility];  Service: Cardiovascular;  Laterality: N/A;  ° SHOULDER SURGERY Right   ° TEE WITHOUT CARDIOVERSION N/A 11/29/2017  ° Procedure: TRANSESOPHAGEAL ECHOCARDIOGRAM (TEE);  Surgeon: Van Trigt, Peter, MD;  [facility];  Service: Open Heart Surgery;  Laterality: N/A;  ° TEE WITHOUT CARDIOVERSION N/A 06/28/2018  ° Procedure: TRANSESOPHAGEAL ECHOCARDIOGRAM (TEE);  Surgeon: Skains, Mark C, MD;  [facility];  Service: Cardiovascular;  Laterality: N/A;  ° TOE AMPUTATION Left 11/28/15  ° 4th toe  ° ° °Social History  ° °Socioeconomic History  ° Marital status: Divorced  °  Spouse name: Not on file  ° Number of children: 3  ° Years of education: HS  ° Highest education level: Not on file  °Occupational History  ° Occupation: Disabled  °Tobacco Use  ° Smoking status: Never  ° Smokeless tobacco: Never  °Vaping Use  ° Vaping Use: Never used  °Substance and Sexual Activity  ° Alcohol use: No  ° Drug use: No  ° Sexual activity: Yes  °Other Topics Concern  ° Not on file  °Social History Narrative  ° Lives at home with his children.  ° Right-handed.  ° No caffeine use.  ° °Social Determinants of Health  ° °Financial Resource Strain: Not on file  °Food Insecurity: Not on file  °Transportation Needs: Not on file  °Physical Activity: Not on file  °Stress: Not on file  °Social Connections: Not on file  °Intimate Partner Violence: Not on file  ° ° °Current Outpatient Medications on File Prior to Visit  °Medication Sig Dispense Refill  ° aspirin EC 325 MG EC tablet Take 1 tablet (325 mg total) by mouth daily. (Patient taking differently: Take 325 mg by mouth at bedtime.) 30 tablet 0  ° Blood Glucose Monitoring Suppl (ONETOUCH VERIO) w/Device KIT 1 application by Does not apply route 4 (four) times daily. 1 kit 0  ° clopidogrel (PLAVIX) 75 MG tablet TAKE 1 TABLET(75 MG) BY MOUTH DAILY 90 tablet 1  ° furosemide (LASIX) 20 MG tablet TAKE 1 TABLET(20 MG) BY  MOUTH DAILY 90 tablet 0  ° glucose blood (ONETOUCH VERIO) test strip 1 each by Other route as needed for other. Use as instructed 100 each 12  ° ibuprofen (ADVIL) 400 MG tablet Take 1 tablet (400 mg total) by mouth 4 (four) times daily. 30 tablet 0  ° Insulin Pen Needle 31G X 5 MM MISC Use as directed to inject insulin. Dx Code:e11.43 30 each 12  ° metoCLOPramide (REGLAN) 10 MG tablet Take 1 tablet (10 mg total) by mouth every 8 (eight) hours as needed for nausea. 30 tablet 0  ° metoprolol succinate (TOPROL-XL) 25 MG 24 hr tablet TAKE 1/2 TABLET BY MOUTH EVERY DAY 45 tablet 1  °   ondansetron (ZOFRAN ODT) 4 MG disintegrating tablet Take 1 tablet (4 mg total) by mouth every 8 (eight) hours as needed for nausea or vomiting. 20 tablet 0   ONETOUCH DELICA LANCETS 76O MISC 1 application by Does not apply route 4 (four) times daily. 100 each 12   pantoprazole (PROTONIX) 40 MG tablet Take 1 tablet (40 mg total) by mouth daily. 30 tablet 3   ramipril (ALTACE) 2.5 MG capsule TAKE 1 CAPSULE(2.5 MG) BY MOUTH DAILY 90 capsule 1   rosuvastatin (CRESTOR) 40 MG tablet TAKE 1 TABLET(40 MG) BY MOUTH DAILY 90 tablet 1   Vitamin D, Ergocalciferol, (DRISDOL) 1.25 MG (50000 UNIT) CAPS capsule Take 1 capsule (50,000 Units total) by mouth every 7 (seven) days. 12 capsule 0   No current facility-administered medications on file prior to visit.    No Known Allergies  Family History  Problem Relation Age of Onset   Cancer Paternal Grandmother    COPD Mother    Heart failure Mother    Anxiety disorder Mother    Depression Mother    Fibromyalgia Mother    Esophageal cancer Father        Smoker, still does   Diabetes Sister    Fibromyalgia Sister    Diabetes Maternal Uncle    Diabetes Maternal Grandmother    GER disease Sister    Liver disease Paternal Grandfather    Hemachromatosis Paternal Grandfather    Diabetes Maternal Grandfather     BP 114/64    Pulse 78    Ht 6' 2" (1.88 m)    Wt 250 lb 12.8 oz (113.8 kg)     SpO2 98%    BMI 32.20 kg/m   Review of Systems     Objective:   Physical Exam    A1c=11.6%    Assessment & Plan:  Type 1 DM: uncontrolled SDOH: We will need to take this complex situation in stages.   Patient Instructions  check your blood sugar twice a day.  vary the time of day when you check, between before the 3 meals, and at bedtime.  also check if you have symptoms of your blood sugar being too high or too low.  please keep a record of the readings and bring it to your next appointment here (or you can bring the meter itself).  You can write it on any piece of paper.  please call us sooner if your blood sugar goes below 70, or if you have a lot of readings over 200.   please increase the Lantus to 70 units each morning.   On this type of insulin schedule, you should eat meals on a regular schedule.  If a meal is missed or significantly delayed, your blood sugar could go low.   I have sent a prescription to a mail order medical supplier, for the continuous glucose monitor.   Please come back for a follow-up appointment in 1 month.

## 2022-02-12 ENCOUNTER — Other Ambulatory Visit (HOSPITAL_COMMUNITY): Payer: Self-pay

## 2022-02-17 ENCOUNTER — Encounter: Payer: Medicare Other | Admitting: Rehabilitative and Restorative Service Providers"

## 2022-03-02 ENCOUNTER — Telehealth: Payer: Self-pay | Admitting: Pharmacy Technician

## 2022-03-02 ENCOUNTER — Other Ambulatory Visit (HOSPITAL_COMMUNITY): Payer: Self-pay

## 2022-03-02 NOTE — Telephone Encounter (Signed)
Patient Advocate Encounter ? ?Received notification from [facility] that prior authorization for FREESTYLE LIBRE 2 SENSORS are required. ?  ?PA NOT NEEDED ?MUST SUBMIT THROUGH PART D NOT MEDICAID ?Status is pending ?  ?[facility] will continue to follow ? ?Travis [name], CPhT ?Patient Advocate ?[facility] ?Phone: [phone] ?Fax:  [phone] ? ?

## 2022-03-11 ENCOUNTER — Ambulatory Visit (INDEPENDENT_AMBULATORY_CARE_PROVIDER_SITE_OTHER): Payer: Medicare Other | Admitting: Endocrinology

## 2022-03-11 ENCOUNTER — Encounter: Payer: Self-pay | Admitting: Endocrinology

## 2022-03-11 VITALS — BP 120/78 | HR 68 | Ht 74.0 in | Wt 255.2 lb

## 2022-03-11 DIAGNOSIS — E1143 Type 2 diabetes mellitus with diabetic autonomic (poly)neuropathy: Secondary | ICD-10-CM

## 2022-03-11 DIAGNOSIS — Z794 Long term (current) use of insulin: Secondary | ICD-10-CM | POA: Diagnosis not present

## 2022-03-11 MED ORDER — LANTUS SOLOSTAR 100 UNIT/ML ~~LOC~~ SOPN: 80.0000 [IU] | PEN_INJECTOR | SUBCUTANEOUS | 3 refills | Status: DC

## 2022-03-11 MED ORDER — FREESTYLE LIBRE 2 SENSOR MISC: 1.0000 | 3 refills | Status: DC

## 2022-03-11 NOTE — Progress Notes (Signed)
? ?Subjective:  ? ? Patient ID: Travis [name], male    DOB: [birth year], 51 y.o.   MRN: [id number] ? ?HPI ?Pt returns for f/u of diabetes mellitus:  ?DM type: 1 ?Dx'ed: 2021 ?Complications: DR, CAD, PN, stage 3b CRI, GP, DN, and Left BKA.   ?Therapy: insulin since 2015.   ?DKA: 2017 ?Severe hypoglycemia: never ?Pancreatitis: never ?Pancreatic imaging: normal on 2021 CT ?SDOH: rx is limited by Noncompliance with f/u.   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 He is here with sister.  Pt says he has not heard back about the CGM.   ?Past Medical History:  ?Diagnosis Date  ? Acute on chronic systolic and diastolic heart failure, NYHA class 3 ([place]) 09/14/2017  ? Acute osteomyelitis of metatarsal bone of left foot ([place]) 11/12/2015  ? CAD in native artery 09/14/2017  ? Closed fracture of right tibial plateau 11/11/2015  ? Coronary artery disease   ? Dehiscence of amputation stump (HCC)   ? left leg  ? Depression with anxiety   ? Diabetes mellitus   ? INSULIN DEPENDENT Type II  ? Diabetic peripheral neuropathy associated with type 2 diabetes mellitus ([place]) 08/30/2008  ? Qualifier: Diagnosis of  By: [name] Done FNP, Tori Milks    ? Diabetic ulcer of left foot ([place]) 11/14/2015  ? Gastroparesis   ? GERD (gastroesophageal reflux disease)   ? History of loop recorder 06/2018  ? Last remote device check was on 02/14/2019  ? Hypertension   ? patient denies  ? Left ventricular aneurysm   ? REPORTS HE IS NOT AWAREW OF THIS   ? Myocardial infarction [facility])   ? Osteomyelitis ([place])   ? Osteoporosis 11/12/2015  ? Peripheral neuropathy   ? feet and below bilateral knees  ? Peripheral vascular disease ([place])   ? PAD in Left leg and some in right leg  ? Shortness of breath dyspnea   ? Stroke [facility]) 05/2018  ? Vitamin D deficiency 11/12/2015  ? ? ?Past Surgical  History:  ?Procedure Laterality Date  ? AMPUTATION Left 11/28/2015  ? Procedure: Left 4th Ray Amputation;  Surgeon: [name], MD;  Location: [place];  Service: Orthopedics;  Laterality: Left;  ? AMPUTATION Left 10/29/2016  ? Procedure: LEFT 5TH RAY AMPUTATION;  Surgeon: [name], MD;  Location: [place];  Service: Orthopedics;  Laterality: Left;  ? AMPUTATION Left 03/09/2019  ? Procedure: LEFT FOOT 5TH RAY AMPUTATION;  Surgeon: [name], MD;  Location: [place];  Service: Orthopedics;  Laterality: Left;  ? AMPUTATION Left 04/18/2019  ? Procedure: LEFT BELOW KNEE AMPUTATION;  Surgeon: [name], MD;  Location: [place];  Service: Orthopedics;  Laterality: Left;  ? ANTERIOR VITRECTOMY Left 04/12/2016  ? Procedure: ANTERIOR CHAMBER [place] OUT WITH GAS FLUID EXCHANGE;  Surgeon: [name], MD;  Location: [place];  Service: Ophthalmology;  Laterality: Left;  ? CARDIAC CATHETERIZATION  10/2017  ? CATARACT EXTRACTION Left   ? CHOLECYSTECTOMY N/A 01/04/2019  ? Procedure: LAPAROSCOPIC CHOLECYSTECTOMY;  Surgeon: [name], MD;  [facility];  Service: General;  Laterality: N/A;  ? CORONARY ARTERY BYPASS GRAFT N/A 11/29/2017  ?  LIMA-LAD, SVG-PDA, SVG-CFX  Procedure: CORONARY ARTERY BYPASS GRAFTING (CABG) times three using the left saphaneous vien. harvested endoscopicly and left internal  mammary artery.;  Surgeon: [name], MD;  Location: [place];  Service: Open Heart Surgery;  Laterality: N/A;  ? EYE SURGERY    ? left eye surgery 04/2016  ? IR CHOLANGIOGRAM EXISTING TUBE  10/17/2018  ? IR CHOLANGIOGRAM EXISTING TUBE  10/30/2018  ? IR PERC CHOLECYSTOSTOMY  09/05/2018  ? KNEE SURGERY Left   ? LOOP RECORDER INSERTION N/A 06/28/2018  ? Procedure: LOOP RECORDER INSERTION;  Surgeon: Deboraha Sprang, MD;  Location: [place] [facility];  Service: Cardiovascular;  Laterality: N/A;  ? RIGHT/LEFT HEART CATH AND CORONARY ANGIOGRAPHY N/A 10/19/2017  ? Procedure: RIGHT/LEFT HEART CATH AND CORONARY ANGIOGRAPHY;  Surgeon: [name],  [name], MD;  Location: [place] [facility];  Service: Cardiovascular;  Laterality: N/A;  ? SHOULDER SURGERY Right   ? TEE WITHOUT CARDIOVERSION N/A 11/29/2017  ? Procedure: TRANSESOPHAGEAL ECHOCARDIOGRAM (TEE);  Surgeon: [name], Collier Salina, MD;  Location: [place];  Service: Open Heart Surgery;  Laterality: N/A;  ? TEE WITHOUT CARDIOVERSION N/A 06/28/2018  ? Procedure: TRANSESOPHAGEAL ECHOCARDIOGRAM (TEE);  Surgeon: [name], MD;  Location[facility];  Service: Cardiovascular;  Laterality: N/A;  ? TOE AMPUTATION Left 11/28/15  ? 4th toe  ? ? ?Social History  ? ?Socioeconomic History  ? Marital status: Divorced  ?  Spouse name: Not on file  ? Number of children: 3  ? Years of education: HS  ? Highest education level: Not on file  ?Occupational History  ? Occupation: Disabled  ?Tobacco Use  ? Smoking status: Never  ? Smokeless tobacco: Never  ?Vaping Use  ? Vaping Use: Never used  ?Substance and Sexual Activity  ? Alcohol use: No  ? Drug use: No  ? Sexual activity: Yes  ?Other Topics Concern  ? Not on file  ?Social History Narrative  ? Lives at home with his children.  ? Right-handed.  ? No caffeine use.  ? ?Social Determinants of Health  ? ?Financial Resource Strain: Not on file  ?Food Insecurity: Not on file  ?Transportation Needs: Not on file  ?Physical Activity: Not on file  ?Stress: Not on file  ?Social Connections: Not on file  ?Intimate Partner Violence: Not on file  ? ? ?Current Outpatient Medications on File Prior to Visit  ?Medication Sig Dispense Refill  ? aspirin EC 325 MG EC tablet Take 1 tablet (325 mg total) by mouth daily. (Patient taking differently: Take 325 mg by mouth at bedtime.) 30 tablet 0  ? Blood Glucose Monitoring Suppl (ONETOUCH VERIO) w/Device KIT 1 application by Does not apply route 4 (four) times daily. 1 kit 0  ? clopidogrel (PLAVIX) 75 MG tablet TAKE 1 TABLET(75 MG) BY MOUTH DAILY 90 tablet 1  ? furosemide (LASIX) 20 MG tablet TAKE 1 TABLET(20 MG) BY MOUTH DAILY 90 tablet 0  ?  glucose blood (ONETOUCH VERIO) test strip 1 each by Other route as needed for other. Use as instructed 100 each 12  ? ibuprofen (ADVIL) 400 MG tablet Take 1 tablet (400 mg total) by mouth 4 (four) times daily. 30 tablet 0  ? Insulin Pen Needle 31G X 5 MM MISC Use as directed to inject insulin. Dx Code:e11.43 30 each 12  ? metoCLOPramide (REGLAN) 10 MG tablet Take 1 tablet (10 mg total) by mouth every 8 (eight) hours as needed for nausea. 30 tablet 0  ? metoprolol succinate (TOPROL-XL) 25 MG 24 hr tablet TAKE 1/2 TABLET BY MOUTH EVERY DAY 45 tablet 1  ? ondansetron (ZOFRAN ODT) 4 MG  disintegrating tablet Take 1 tablet (4 mg total) by mouth every 8 (eight) hours as needed for nausea or vomiting. 20 tablet 0  ? ONETOUCH DELICA LANCETS 13Y MISC 1 application by Does not apply route 4 (four) times daily. 100 each 12  ? pantoprazole (PROTONIX) 40 MG tablet Take 1 tablet (40 mg total) by mouth daily. 30 tablet 3  ? ramipril (ALTACE) 2.5 MG capsule TAKE 1 CAPSULE(2.5 MG) BY MOUTH DAILY 90 capsule 1  ? rosuvastatin (CRESTOR) 40 MG tablet TAKE 1 TABLET(40 MG) BY MOUTH DAILY 90 tablet 1  ? Vitamin D, Ergocalciferol, (DRISDOL) 1.25 MG (50000 UNIT) CAPS capsule Take 1 capsule (50,000 Units total) by mouth every 7 (seven) days. 12 capsule 0  ? ?No current facility-administered medications on file prior to visit.  ? ? ?No Known Allergies ? ?Family History  ?Problem Relation Age of Onset  ? Cancer Paternal Grandmother   ? COPD Mother   ? Heart failure Mother   ? Anxiety disorder Mother   ? Depression Mother   ? Fibromyalgia Mother   ? Esophageal cancer Father   ?     Smoker, still does  ? Diabetes Sister   ? Fibromyalgia Sister   ? Diabetes Maternal Uncle   ? Diabetes Maternal Grandmother   ? GER disease Sister   ? Liver disease Paternal Grandfather   ? Hemachromatosis Paternal Grandfather   ? Diabetes Maternal Grandfather   ? ? ?BP 120/78 (BP Location: Right Arm, Patient Position: Sitting, Cuff Size: Normal)   Pulse 68   Ht 6'  2" (1.88 m)   Wt 255 lb 3.2 oz (115.8 kg)   SpO2 99%   BMI 32.77 kg/m?  ? ? ?Review of Systems ? ?   ?Objective:  ? Physical Exam ? ? ? ?A1c=11.0% ?   ?Assessment & Plan:  ?Type 1 DM: uncontrolled.   ? ?[place]

## 2022-03-11 NOTE — Patient Instructions (Addendum)
check your blood sugar twice a day.  vary the time of day when you check, between before the 3 meals, and at bedtime.  also check if you have symptoms of your blood sugar being too high or too low.  please keep a record of the readings and bring it to your next appointment here (or you can bring the meter itself).  You can write it on any piece of paper.  please call us sooner if your blood sugar goes below 70, or if you have a lot of readings over 200.   ?please increase the Lantus to 80 units each morning.   ?On this type of insulin schedule, you should eat meals on a regular schedule.  If a meal is missed or significantly delayed, your blood sugar could go low.   ?I have sent a prescription to a different mail order medical supplier, for the continuous glucose monitor.   ?Please come back for a follow-up appointment in 2-3 months.   ?

## 2022-03-18 ENCOUNTER — Encounter: Payer: Medicare Other | Admitting: Rehabilitative and Restorative Service Providers"

## 2022-03-18 NOTE — Therapy (Incomplete)
?OUTPATIENT OCCUPATIONAL THERAPY ORTHO EVALUATION ? ?Patient Name: Travis [name] ?MRN: [id number] ?DOB:September 13, 1971, 51 y.o., male ?Today's Date: 03/18/2022 ? ?PCP: Jettie Pagan, NP ?REFERRING PROVIDER: Marlyne Beards, MD ? ? ? ?Past Medical History:  ?Diagnosis Date  ? Acute on chronic systolic and diastolic heart failure, NYHA class 3 (HCC) 09/14/2017  ? Acute osteomyelitis of metatarsal bone of left foot (HCC) 11/12/2015  ? CAD in native artery 09/14/2017  ? Closed fracture of right tibial plateau 11/11/2015  ? Coronary artery disease   ? Dehiscence of amputation stump (HCC)   ? left leg  ? Depression with anxiety   ? Diabetes mellitus   ? INSULIN DEPENDENT Type II  ? Diabetic peripheral neuropathy associated with type 2 diabetes mellitus (HCC) 08/30/2008  ? Qualifier: Diagnosis of  By: Daphine Deutscher FNP, Zena Amos    ? Diabetic ulcer of left foot (HCC) 11/14/2015  ? Gastroparesis   ? GERD (gastroesophageal reflux disease)   ? History of loop recorder 06/2018  ? Last remote device check was on 02/14/2019  ? Hypertension   ? patient denies  ? Left ventricular aneurysm   ? REPORTS HE IS NOT AWAREW OF THIS   ? Myocardial infarction [facility])   ? Osteomyelitis (HCC)   ? Osteoporosis 11/12/2015  ? Peripheral neuropathy   ? feet and below bilateral knees  ? Peripheral vascular disease (HCC)   ? PAD in Left leg and some in right leg  ? Shortness of breath dyspnea   ? Stroke [facility]) 05/2018  ? Vitamin D deficiency 11/12/2015  ? ?Past Surgical History:  ?Procedure Laterality Date  ? AMPUTATION Left 11/28/2015  ? Procedure: Left 4th Ray Amputation;  Surgeon: Nadara Mustard, MD;  Location: [facility] OR;  Service: Orthopedics;  Laterality: Left;  ? AMPUTATION Left 10/29/2016  ? Procedure: LEFT 5TH RAY AMPUTATION;  Surgeon: Nadara Mustard, MD;  Location: [facility] OR;  Service: Orthopedics;  Laterality: Left;  ? AMPUTATION Left 03/09/2019  ? Procedure: LEFT FOOT 5TH RAY AMPUTATION;  Surgeon: Nadara Mustard, MD;  Location: [facility] OR;  Service: Orthopedics;   Laterality: Left;  ? AMPUTATION Left 04/18/2019  ? Procedure: LEFT BELOW KNEE AMPUTATION;  Surgeon: Nadara Mustard, MD;  Location: [facility] OR;  Service: Orthopedics;  Laterality: Left;  ? ANTERIOR VITRECTOMY Left 04/12/2016  ? Procedure: ANTERIOR CHAMBER WASH OUT WITH GAS FLUID EXCHANGE;  Surgeon: Carmela Rima, MD;  Location: [facility] OR;  Service: Ophthalmology;  Laterality: Left;  ? CARDIAC CATHETERIZATION  10/2017  ? CATARACT EXTRACTION Left   ? CHOLECYSTECTOMY N/A 01/04/2019  ? Procedure: LAPAROSCOPIC CHOLECYSTECTOMY;  Surgeon: Gaynelle Adu, MD;  Location: [facility] ORS;  Service: General;  Laterality: N/A;  ? CORONARY ARTERY BYPASS GRAFT N/A 11/29/2017  ?  LIMA-LAD, SVG-PDA, SVG-CFX  Procedure: CORONARY ARTERY BYPASS GRAFTING (CABG) times three using the left saphaneous vien. harvested endoscopicly and left internal mammary artery.;  Surgeon: Kerin Perna, MD;  Location: [facility] OR;  Service: Open Heart Surgery;  Laterality: N/A;  ? EYE SURGERY    ? left eye surgery 04/2016  ? IR CHOLANGIOGRAM EXISTING TUBE  10/17/2018  ? IR CHOLANGIOGRAM EXISTING TUBE  10/30/2018  ? IR PERC CHOLECYSTOSTOMY  09/05/2018  ? KNEE SURGERY Left   ? LOOP RECORDER INSERTION N/A 06/28/2018  ? Procedure: LOOP RECORDER INSERTION;  Surgeon: Duke Salvia, MD;  Location: [facility] INVASIVE CV LAB;  Service: Cardiovascular;  Laterality: N/A;  ? RIGHT/LEFT HEART CATH AND CORONARY ANGIOGRAPHY N/A 10/19/2017  ? Procedure: RIGHT/LEFT HEART CATH AND  CORONARY ANGIOGRAPHY;  Surgeon: Swaziland, [name], MD;  Location: [facility] INVASIVE CV LAB;  Service: Cardiovascular;  Laterality: N/A;  ? SHOULDER SURGERY Right   ? TEE WITHOUT CARDIOVERSION N/A 11/29/2017  ? Procedure: TRANSESOPHAGEAL ECHOCARDIOGRAM (TEE);  Surgeon: Donata Clay, Theron Arista, MD;  Location: [facility] OR;  Service: Open Heart Surgery;  Laterality: N/A;  ? TEE WITHOUT CARDIOVERSION N/A 06/28/2018  ? Procedure: TRANSESOPHAGEAL ECHOCARDIOGRAM (TEE);  Surgeon: Jake Bathe, MD;  Location: [facility] ENDOSCOPY;  Service: Cardiovascular;  Laterality:  N/A;  ? TOE AMPUTATION Left 11/28/15  ? 4th toe  ? ?Patient Active Problem List  ? Diagnosis Date Noted  ? Degenerative arthritis of right wrist 01/28/2022  ? Distal radius fracture, right 01/28/2022  ? Fatigue 08/04/2020  ? Gastroesophageal reflux disease without esophagitis 08/04/2020  ? Below-knee amputation of left lower extremity (HCC) 04/18/2019  ? Dehiscence of incision, sequela   ? H/O amputation of lesser toe, left (HCC) 04/04/2019  ? Foreign body (FB) in soft tissue   ? Pressure injury of left foot, stage 2 (HCC) 02/14/2019  ? PVD (peripheral vascular disease) (HCC) 02/14/2019  ? Snoring 02/14/2019  ? S/P laparoscopic cholecystectomy 01/04/2019  ? Intractable vomiting   ? Sepsis (HCC) 10/29/2018  ? Abnormal finding on imaging 10/29/2018  ? Cholecystostomy care [facility]) 09/05/2018  ? Essential hypertension 06/27/2018  ? Hyperlipidemia 06/27/2018  ? Diabetic retinopathy (HCC) 06/27/2018  ? Leukocytosis 06/27/2018  ? 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 Left arm numbness 03/03/2018  ? Strain of right trapezius muscle 03/03/2018  ? Ischemic cardiomyopathy 12/16/2017  ? Major depressive disorder, recurrent episode, mild (HCC) 09/16/2017  ? Acute on chronic systolic and diastolic heart failure, NYHA class 3 (HCC) 09/14/2017  ? CAD in native artery 09/14/2017  ? S/P BKA (below knee amputation) unilateral, left (HCC) 11/19/2016  ? Short Achilles tendon (acquired), left ankle 11/19/2016  ? Subacute osteomyelitis, left ankle and foot (HCC) 10/20/2016  ? Gastroparesis due to DM [facility])   ? History of diabetic ulcer of foot 11/14/2015  ? Vitamin D deficiency 11/12/2015  ? Osteoporosis 11/12/2015  ? GERD (gastroesophageal reflux disease) 11/03/2015  ? Erectile dysfunction 07/11/2009  ? Allergic rhinitis 12/11/2008  ? Diabetic peripheral neuropathy associated with type 2 diabetes mellitus (HCC) 08/30/2008  ? Insomnia 08/24/2007  ? Diabetes (HCC) 08/09/2007  ? ? ?ONSET  DATE: *** ? 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 ? ?THERAPY DIAG:  ?No diagnosis found. ? ?SUBJECTIVE:  ? ?SUBJECTIVE STATEMENT: ?*** ?Pt accompanied by: {accompnied:27141} ? ?PERTINENT HISTORY: Per MD: "Right wrist fracture with arthritis.  Needs supportive thermoplast splint.  No protective sensation secondary to DM." ? ?PRECAUTIONS: {Therapy precautions:24002} ? ?WEIGHT BEARING RESTRICTIONS {Yes ***/No:24003} ? ?PAIN:  ?Are you having pain? {OPRCPAIN:27236} ? ?FALLS: Has patient fallen in last 6 months? {fallsyesno:27318} ? ?LIVING ENVIRONMENT: ?Lives with: {OPRC lives with:25569::"lives with their family"} ?Lives in: {Lives in:25570} ?Stairs: {opstairs:27293} ?Has following equipment at home: {Assistive devices:23999} ? ?PLOF: {PLOF:24004} ? ?PATIENT GOALS *** ? ?OBJECTIVE:  ? ?HAND DOMINANCE: {MISC; OT HAND DOMINANCE:[id number]} ? ?ADLs: ?Overall ADLs: *** ?Transfers/ambulation related to ADLs: ?Eating: *** ?Grooming: *** ?UB Dressing: *** ?LB Dressing: *** ?Toileting: *** ?Bathing: *** ?Tub Shower transfers: *** ?Equipment: {equipment:25573} ? ?FUNCTIONAL OUTCOME MEASURES: ?{OTFUNCTIONALMEASURES:27238} ? ?UE ROM    ? ?{AROM/PROM:27142} ROM Right ?03/18/2022 Left ?03/18/2022  ?Shoulder  flexion    ?Shoulder abduction    ?Shoulder adduction    ?Shoulder extension    ?Shoulder internal rotation    ?Shoulder external rotation    ?Elbow flexion    ?Elbow extension    ?Wrist flexion    ?Wrist extension    ?Wrist ulnar deviation    ?Wrist radial deviation    ?Wrist pronation    ?Wrist supination    ?(Blank rows = not tested) ? ?{AROM/PROM:27142} ROM Right ?03/18/2022 Left ?03/18/2022  ?Thumb MCP (0-60)    ?Thumb IP (0-80)    ?Thumb Radial abd/add (0-55)     ?Thumb Palmar abd/add (0-45)     ?Thumb Opposition to Small Finger     ?Index MCP (0-90)     ?Index PIP  (0-100)     ?Index DIP (0-70)      ?Long MCP (0-90)      ?Long PIP (0-100)      ?Long DIP (0-70)      ?Ring MCP (0-90)      ?Ring PIP (0-100)      ?Ring DIP (0-70)      ?Little MCP (0-90)      ?Little PIP (0-

## 2022-03-29 ENCOUNTER — Encounter: Payer: Medicare Other | Admitting: Rehabilitative and Restorative Service Providers"

## 2022-03-29 NOTE — Therapy (Incomplete)
?OUTPATIENT OCCUPATIONAL THERAPY ORTHO EVALUATION ? ?Patient Name: Travis [name] ?MRN: [id number] ?DOB:09/08/1971, 51 y.o., male ?Today's Date: 03/29/2022 ? ?PCP: [name], [name], NP ?REFERRING PROVIDER: Marlyne BeardsBenfield, Charlie, MD ? ? ? ?Past Medical History:  ?Diagnosis Date  ? Acute on chronic systolic and diastolic heart failure, NYHA class 3 (HCC) 09/14/2017  ? Acute osteomyelitis of metatarsal bone of left foot (HCC) 11/12/2015  ? CAD in native artery 09/14/2017  ? Closed fracture of right tibial plateau 11/11/2015  ? Coronary artery disease   ? Dehiscence of amputation stump (HCC)   ? left leg  ? Depression with anxiety   ? Diabetes mellitus   ? INSULIN DEPENDENT Type II  ? Diabetic peripheral neuropathy associated with type 2 diabetes mellitus (HCC) 08/30/2008  ? Qualifier: Diagnosis of  By: Daphine DeutscherMartin FNP, Zena AmosNykedtra    ? Diabetic ulcer of left foot (HCC) 11/14/2015  ? Gastroparesis   ? GERD (gastroesophageal reflux disease)   ? History of loop recorder 06/2018  ? Last remote device check was on 02/14/2019  ? Hypertension   ? patient denies  ? Left ventricular aneurysm   ? REPORTS HE IS NOT AWAREW OF THIS   ? Myocardial infarction [facility](HCC)   ? Osteomyelitis (HCC)   ? Osteoporosis 11/12/2015  ? Peripheral neuropathy   ? feet and below bilateral knees  ? Peripheral vascular disease (HCC)   ? PAD in Left leg and some in right leg  ? Shortness of breath dyspnea   ? Stroke [facility](HCC) 05/2018  ? Vitamin D deficiency 11/12/2015  ? ?Past Surgical History:  ?Procedure Laterality Date  ? AMPUTATION Left 11/28/2015  ? Procedure: Left 4th Ray Amputation;  Surgeon: Nadara MustardMarcus Duda V, MD;  [facility];  Service: Orthopedics;  Laterality: Left;  ? AMPUTATION Left 10/29/2016  ? Procedure: LEFT 5TH RAY AMPUTATION;  Surgeon: Nadara MustardMarcus V Duda, MD;  Location: [facility] OR;  Service: Orthopedics;  Laterality: Left;  ? AMPUTATION Left 03/09/2019  ? Procedure: LEFT FOOT 5TH RAY AMPUTATION;  Surgeon: Nadara Mustarduda, Marcus V, MD;  [facility];  Service: Orthopedics;   Laterality: Left;  ? AMPUTATION Left 04/18/2019  ? Procedure: LEFT BELOW KNEE AMPUTATION;  Surgeon: Nadara Mustarduda, Marcus V, MD;  [facility];  Service: Orthopedics;  Laterality: Left;  ? ANTERIOR VITRECTOMY Left 04/12/2016  ? Procedure: ANTERIOR CHAMBER WASH OUT WITH GAS FLUID EXCHANGE;  Surgeon: Carmela RimaNarendra Patel, MD;  [facility];  Service: Ophthalmology;  Laterality: Left;  ? CARDIAC CATHETERIZATION  10/2017  ? CATARACT EXTRACTION Left   ? CHOLECYSTECTOMY N/A 01/04/2019  ? Procedure: LAPAROSCOPIC CHOLECYSTECTOMY;  Surgeon: Gaynelle AduWilson, Eric, MD;  Location: [facility] ORS;  Service: General;  Laterality: N/A;  ? CORONARY ARTERY BYPASS GRAFT N/A 11/29/2017  ?  LIMA-LAD, SVG-PDA, SVG-CFX  Procedure: CORONARY ARTERY BYPASS GRAFTING (CABG) times three using the left saphaneous vien. harvested endoscopicly and left internal mammary artery.;  Surgeon: Kerin PernaVan Trigt, Peter, MD;  [facility];  Service: Open Heart Surgery;  Laterality: N/A;  ? EYE SURGERY    ? left eye surgery 04/2016  ? IR CHOLANGIOGRAM EXISTING TUBE  10/17/2018  ? IR CHOLANGIOGRAM EXISTING TUBE  10/30/2018  ? IR PERC CHOLECYSTOSTOMY  09/05/2018  ? KNEE SURGERY Left   ? LOOP RECORDER INSERTION N/A 06/28/2018  ? Procedure: LOOP RECORDER INSERTION;  Surgeon: Duke SalviaKlein, Steven C, MD;  [facility];  Service: Cardiovascular;  Laterality: N/A;  ? RIGHT/LEFT HEART CATH AND CORONARY ANGIOGRAPHY N/A 10/19/2017  ? Procedure: RIGHT/LEFT HEART CATH AND  CORONARY ANGIOGRAPHY;  Surgeon: Swaziland, [name], MD;  Location: [facility] INVASIVE CV LAB;  Service: Cardiovascular;  Laterality: N/A;  ? SHOULDER SURGERY Right   ? TEE WITHOUT CARDIOVERSION N/A 11/29/2017  ? Procedure: TRANSESOPHAGEAL ECHOCARDIOGRAM (TEE);  Surgeon: Donata Clay, Theron Arista, MD;  Location: [facility] OR;  Service: Open Heart Surgery;  Laterality: N/A;  ? TEE WITHOUT CARDIOVERSION N/A 06/28/2018  ? Procedure: TRANSESOPHAGEAL ECHOCARDIOGRAM (TEE);  Surgeon: Jake Bathe, MD;  Location: [facility] ENDOSCOPY;  Service: Cardiovascular;  Laterality:  N/A;  ? TOE AMPUTATION Left 11/28/15  ? 4th toe  ? ?Patient Active Problem List  ? Diagnosis Date Noted  ? Degenerative arthritis of right wrist 01/28/2022  ? Distal radius fracture, right 01/28/2022  ? Fatigue 08/04/2020  ? Gastroesophageal reflux disease without esophagitis 08/04/2020  ? Below-knee amputation of left lower extremity (HCC) 04/18/2019  ? Dehiscence of incision, sequela   ? H/O amputation of lesser toe, left (HCC) 04/04/2019  ? Foreign body (FB) in soft tissue   ? Pressure injury of left foot, stage 2 (HCC) 02/14/2019  ? PVD (peripheral vascular disease) (HCC) 02/14/2019  ? Snoring 02/14/2019  ? S/P laparoscopic cholecystectomy 01/04/2019  ? Intractable vomiting   ? Sepsis (HCC) 10/29/2018  ? Abnormal finding on imaging 10/29/2018  ? Cholecystostomy care [facility]) 09/05/2018  ? Essential hypertension 06/27/2018  ? Hyperlipidemia 06/27/2018  ? Diabetic retinopathy (HCC) 06/27/2018  ? Leukocytosis 06/27/2018  ? 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 Left arm numbness 03/03/2018  ? Strain of right trapezius muscle 03/03/2018  ? Ischemic cardiomyopathy 12/16/2017  ? Major depressive disorder, recurrent episode, mild (HCC) 09/16/2017  ? Acute on chronic systolic and diastolic heart failure, NYHA class 3 (HCC) 09/14/2017  ? CAD in native artery 09/14/2017  ? S/P BKA (below knee amputation) unilateral, left (HCC) 11/19/2016  ? Short Achilles tendon (acquired), left ankle 11/19/2016  ? Subacute osteomyelitis, left ankle and foot (HCC) 10/20/2016  ? Gastroparesis due to DM [facility])   ? History of diabetic ulcer of foot 11/14/2015  ? Vitamin D deficiency 11/12/2015  ? Osteoporosis 11/12/2015  ? GERD (gastroesophageal reflux disease) 11/03/2015  ? Erectile dysfunction 07/11/2009  ? Allergic rhinitis 12/11/2008  ? Diabetic peripheral neuropathy associated with type 2 diabetes mellitus (HCC) 08/30/2008  ? Insomnia 08/24/2007  ? Diabetes (HCC) 08/09/2007  ? ? ?ONSET  DATE: *** ? 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 ? ?THERAPY DIAG:  ?No diagnosis found. ? ?SUBJECTIVE:  ? ?SUBJECTIVE STATEMENT: ?*** ?Pt accompanied by: {accompnied:27141} ? ?PERTINENT HISTORY: Per MD: "Right wrist fracture with arthritis.  Needs supportive thermoplast splint.  No protective sensation secondary to DM." ? ?PRECAUTIONS: {Therapy precautions:24002} ? ?WEIGHT BEARING RESTRICTIONS {Yes ***/No:24003} ? ?PAIN:  ?Are you having pain? {OPRCPAIN:27236} ? ?FALLS: Has patient fallen in last 6 months? {fallsyesno:27318} ? ?LIVING ENVIRONMENT: ?Lives with: {OPRC lives with:25569::"lives with their family"} ?Lives in: {Lives in:25570} ?Stairs: {opstairs:27293} ?Has following equipment at home: {Assistive devices:23999} ? ?PLOF: {PLOF:24004} ? ?PATIENT GOALS *** ? ?OBJECTIVE:  ? ?HAND DOMINANCE: {MISC; OT HAND DOMINANCE:[id number]} ? ?ADLs: ?Overall ADLs: *** ?Transfers/ambulation related to ADLs: ?Eating: *** ?Grooming: *** ?UB Dressing: *** ?LB Dressing: *** ?Toileting: *** ?Bathing: *** ?Tub Shower transfers: *** ?Equipment: {equipment:25573} ? ?FUNCTIONAL OUTCOME MEASURES: ?Quick Dash: *** ? ?UE ROM    ? ?{AROM/PROM:27142} ROM Right ?03/29/2022 Left ?03/29/2022  ?  Shoulder flexion    ?Shoulder abduction    ?Shoulder adduction    ?Shoulder extension    ?Shoulder internal rotation    ?Shoulder external rotation    ?Elbow flexion    ?Elbow extension    ?Wrist flexion    ?Wrist extension    ?Wrist ulnar deviation    ?Wrist radial deviation    ?Wrist pronation    ?Wrist supination    ?(Blank rows = not tested) ? ?{AROM/PROM:27142} ROM Right ?03/29/2022 Left ?03/29/2022  ?Thumb MCP (0-60)    ?Thumb IP (0-80)    ?Thumb Radial abd/add (0-55)     ?Thumb Palmar abd/add (0-45)     ?Thumb Opposition to Small Finger     ?Index MCP (0-90)     ?Index PIP (0-100)      ?Index DIP (0-70)      ?Long MCP (0-90)      ?Long PIP (0-100)      ?Long DIP (0-70)      ?Ring MCP (0-90)      ?Ring PIP (0-100)      ?Ring DIP (0-70)      ?Little MCP (0-90)      ?Little PIP (0-100)

## 2022-04-06 ENCOUNTER — Encounter: Payer: Self-pay | Admitting: Endocrinology

## 2022-04-06 DIAGNOSIS — E1143 Type 2 diabetes mellitus with diabetic autonomic (poly)neuropathy: Secondary | ICD-10-CM

## 2022-04-06 MED ORDER — FREESTYLE LIBRE 2 SENSOR MISC: 1.0000 | 3 refills | Status: DC

## 2022-04-20 ENCOUNTER — Ambulatory Visit (INDEPENDENT_AMBULATORY_CARE_PROVIDER_SITE_OTHER): Payer: Medicare Other | Admitting: Family

## 2022-04-20 ENCOUNTER — Ambulatory Visit (INDEPENDENT_AMBULATORY_CARE_PROVIDER_SITE_OTHER): Payer: Medicare Other

## 2022-04-20 ENCOUNTER — Encounter: Payer: Self-pay | Admitting: Family

## 2022-04-20 DIAGNOSIS — M5416 Radiculopathy, lumbar region: Secondary | ICD-10-CM | POA: Diagnosis not present

## 2022-04-20 MED ORDER — PREDNISONE 50 MG PO TABS: ORAL_TABLET | ORAL | 0 refills | Status: DC

## 2022-04-20 NOTE — Progress Notes (Signed)
? ?Office Visit Note ?  ?Patient: Travis [name]           ?Date of Birth: 10/27/1971           ?MRN: [id number] ?Visit Date: 04/20/2022 ?             ?Requested by: [name], [name], NP ?[phone] [facility] [address] ?[place],  [place] [id number] ?PCP: [name], [name], NP ? ?Chief Complaint  ?Patient presents with  ? Left Leg - Pain  ? ? ? ? ?HPI: ?The patient is a 51 year old gentleman who presents to complaining of leg pain this has been gradually worsening over weeks he has shooting pain from his thigh down to the end of his stump he occasionally feels numbness and tingling in his foot and toes which are surgically absent.  He is status post below-knee amputation on the left.  Some days he cannot even wear his prosthesis or bear weight due to pain and sensitivity.  In the morning things feel best but he has gradually worsening pain with weightbearing over the course of the day difficulty sleeping due to pain ? ?Assessment & Plan: ?Visit Diagnoses:  ?1. Lumbar back pain with radiculopathy affecting left lower extremity   ?2. Radiculopathy, lumbar region   ? ? ?Plan: Trial prednisone burst.  If we cannot get any relief with this we will proceed with epidural steroid injection.  Discussed option of possible MRI in the future of his lumbar spine. ? ?Follow-Up Instructions: Return in about 4 weeks (around 05/18/2022), or if symptoms worsen or fail to improve.  ? ?Left Hip Exam  ?Left hip exam is normal. ? ? ?Back Exam  ? ?Tenderness  ?The patient is experiencing no tenderness.  ? ?Tests  ?Straight leg raise right: negative ?Straight leg raise left: positive ? ? ? ? ?Patient is alert, oriented, no adenopathy, well-dressed, normal affect, normal respiratory effort. ? ? ?Imaging: ?No results found. ?No images are attached to the encounter. ? ?Labs: ?Lab Results  ?Component Value Date  ? HGBA1C 11.6 (A) 02/11/2022  ? HGBA1C 9.7 (A) 02/11/2021  ? HGBA1C 8.5 (A) 11/12/2020  ? ESRSEDRATE 1 11/09/2016  ? ESRSEDRATE 24 (H)  10/06/2016  ? CRP 0.6 11/09/2016  ? CRP 6.3 (H) 10/06/2016  ? REPTSTATUS 11/03/2018 FINAL 10/29/2018  ? REPTSTATUS 11/03/2018 FINAL 10/29/2018  ? CULT  10/29/2018  ?  NO GROWTH 5 DAYS ?Performed at [facility], [address] [place], [place] [address] ?  ? CULT  10/29/2018  ?  NO GROWTH 5 DAYS ?Performed at [facility], [address] [place], [place] [address] ?  ? ? ? ?Lab Results  ?Component Value Date  ? ALBUMIN 3.9 09/27/2020  ? ALBUMIN 4.2 07/22/2020  ? ALBUMIN 3.5 04/18/2019  ? PREALBUMIN 20.2 11/11/2015  ? ? ?Lab Results  ?Component Value Date  ? MG 2.0 11/01/2018  ? MG 2.1 11/30/2017  ? MG 2.3 11/30/2017  ? ?Lab Results  ?Component Value Date  ? VD25OH 7.85 (L) 07/22/2020  ? VD25OH 17.8 (L) 11/11/2015  ? ? ?Lab Results  ?Component Value Date  ? PREALBUMIN 20.2 11/11/2015  ? ? ?  Latest Ref Rng & Units 09/27/2020  ?  7:52 AM 07/22/2020  ?  2:52 PM 04/18/2019  ? 10:20 AM  ?CBC EXTENDED  ?WBC 4.0 - 10.5 K/uL 12.7   9.1   10.1    ?RBC 4.22 - 5.81 MIL/uL 4.25   4.60   4.62    ?  Hemoglobin 13.0 - 17.0 g/dL 63.8   75.6   43.3    ?HCT 39.0 - 52.0 % 37.5   40.8   39.8    ?Platelets 150 - 400 K/uL 258   260.0   361    ?NEUT# 1.7 - 7.7 K/uL 10.2   6.3     ?Lymph# 0.7 - 4.0 K/uL 1.5   1.9     ? ? ? ?There is no height or weight on file to calculate BMI. ? ?Orders:  ?Orders Placed This Encounter  ?Procedures  ? XR Lumbar Spine 2-3 Views  ? ?Meds ordered this encounter  ?Medications  ? predniSONE (DELTASONE) 50 MG tablet  ?  Sig: Take once daily by mouth for 5 days.  ?  Dispense:  5 tablet  ?  Refill:  0  ? ? ? Procedures: ?No procedures performed ? ?Clinical Data: ?No additional findings. ? ?ROS: ? ?All other systems negative, except as noted in the HPI. ?Review of Systems ? ?Objective: ?Vital Signs: There were no vitals taken for this visit. ? ?Specialty Comments:  ?No specialty comments available. ? ?PMFS History: ?Patient Active Problem List  ? Diagnosis Date Noted  ? Degenerative arthritis of right  wrist 01/28/2022  ? Distal radius fracture, right 01/28/2022  ? Fatigue 08/04/2020  ? Gastroesophageal reflux disease without esophagitis 08/04/2020  ? Below-knee amputation of left lower extremity (HCC) 04/18/2019  ? Dehiscence of incision, sequela   ? H/O amputation of lesser toe, left (HCC) 04/04/2019  ? Foreign body (FB) in soft tissue   ? Pressure injury of left foot, stage 2 (HCC) 02/14/2019  ? PVD (peripheral vascular disease) (HCC) 02/14/2019  ? Snoring 02/14/2019  ? S/P laparoscopic cholecystectomy 01/04/2019  ? Intractable vomiting   ? Sepsis (HCC) 10/29/2018  ? Abnormal finding on imaging 10/29/2018  ? Cholecystostomy care [facility]) 09/05/2018  ? Essential hypertension 06/27/2018  ? Hyperlipidemia 06/27/2018  ? Diabetic retinopathy (HCC) 06/27/2018  ? Leukocytosis 06/27/2018  ? 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 Left arm numbness 03/03/2018  ? Strain of right trapezius muscle 03/03/2018  ? Ischemic cardiomyopathy 12/16/2017  ? Major depressive disorder, recurrent episode, mild (HCC) 09/16/2017  ? Acute on chronic systolic and diastolic heart failure, NYHA class 3 (HCC) 09/14/2017  ? CAD in native artery 09/14/2017  ? S/P BKA (below knee amputation) unilateral, left (HCC) 11/19/2016  ? Short Achilles tendon (acquired), left ankle 11/19/2016  ? Subacute osteomyelitis, left ankle and foot (HCC) 10/20/2016  ? Gastroparesis due to DM [facility])   ? History of diabetic ulcer of foot 11/14/2015  ? Vitamin D deficiency 11/12/2015  ? Osteoporosis 11/12/2015  ? GERD (gastroesophageal reflux disease) 11/03/2015  ? Erectile dysfunction 07/11/2009  ? Allergic rhinitis 12/11/2008  ? Diabetic peripheral neuropathy associated with type 2 diabetes mellitus (HCC) 08/30/2008  ? Insomnia 08/24/2007  ? Diabetes (HCC) 08/09/2007  ? ?Past Medical History:  ?Diagnosis Date  ? Acute on chronic systolic and diastolic heart failure, NYHA class 3 (HCC) 09/14/2017  ? Acute osteomyelitis  of metatarsal bone of left foot (HCC) 11/12/2015  ? CAD in native artery 09/14/2017  ? Closed fracture of right tibial plateau 11/11/2015  ? Coronary artery disease   ? Dehiscence of amputation stump (HCC)   ? left leg  ? Depression with anxiety   ? Diabetes mellitus   ? INSULIN DEPENDENT Type II  ? Diabetic peripheral neuropathy associated with type 2 diabetes mellitus (HCC)  08/30/2008  ? Qualifier: Diagnosis of  By: Daphine Deutscher FNP, Zena Amos    ? Diabetic ulcer of left foot (HCC) 11/14/2015  ? Gastroparesis   ? GERD (gastroesophageal reflux disease)   ? History of loop recorder 06/2018  ? Last remote device check was on 02/14/2019  ? Hypertension   ? patient denies  ? Left ventricular aneurysm   ? REPORTS HE IS NOT AWAREW OF THIS   ? Myocardial infarction [facility])   ? Osteomyelitis (HCC)   ? Osteoporosis 11/12/2015  ? Peripheral neuropathy   ? feet and below bilateral knees  ? Peripheral vascular disease (HCC)   ? PAD in Left leg and some in right leg  ? Shortness of breath dyspnea   ? Stroke [facility]) 05/2018  ? Vitamin D deficiency 11/12/2015  ?  ?Family History  ?Problem Relation Age of Onset  ? Cancer Paternal Grandmother   ? COPD Mother   ? Heart failure Mother   ? Anxiety disorder Mother   ? Depression Mother   ? Fibromyalgia Mother   ? Esophageal cancer Father   ?     Smoker, still does  ? Diabetes Sister   ? Fibromyalgia Sister   ? Diabetes Maternal Uncle   ? Diabetes Maternal Grandmother   ? GER disease Sister   ? Liver disease Paternal Grandfather   ? Hemachromatosis Paternal Grandfather   ? Diabetes Maternal Grandfather   ?  ?Past Surgical History:  ?Procedure Laterality Date  ? AMPUTATION Left 11/28/2015  ? Procedure: Left 4th Ray Amputation;  Surgeon: Nadara Mustard, MD;  Location: [facility] OR;  Service: Orthopedics;  Laterality: Left;  ? AMPUTATION Left 10/29/2016  ? Procedure: LEFT 5TH RAY AMPUTATION;  Surgeon: Nadara Mustard, MD;  Location: [facility] OR;  Service: Orthopedics;  Laterality: Left;  ? AMPUTATION Left 03/09/2019  ?  Procedure: LEFT FOOT 5TH RAY AMPUTATION;  Surgeon: Nadara Mustard, MD;  Location: [facility] OR;  Service: Orthopedics;  Laterality: Left;  ? AMPUTATION Left 04/18/2019  ? Procedure: LEFT BELOW KNEE AMPUTATION;  Surgeon: Lajoyce Corners

## 2022-05-12 ENCOUNTER — Ambulatory Visit: Payer: Medicare Other | Admitting: Family

## 2022-06-23 ENCOUNTER — Encounter: Payer: Self-pay | Admitting: *Deleted

## 2023-06-06 ENCOUNTER — Telehealth: Payer: Self-pay

## 2023-06-06 DIAGNOSIS — Z794 Long term (current) use of insulin: Secondary | ICD-10-CM

## 2023-06-06 MED ORDER — LANTUS SOLOSTAR 100 UNIT/ML ~~LOC~~ SOPN: 80.0000 [IU] | PEN_INJECTOR | SUBCUTANEOUS | 0 refills | Status: DC

## 2023-06-06 NOTE — Telephone Encounter (Signed)
Rafan Sanders Ann Jerl Munyan, CMA  ?

## 2024-01-02 ENCOUNTER — Emergency Department (HOSPITAL_COMMUNITY): Payer: 59

## 2024-01-02 ENCOUNTER — Encounter (HOSPITAL_COMMUNITY): Payer: Self-pay | Admitting: Internal Medicine

## 2024-01-02 ENCOUNTER — Other Ambulatory Visit: Payer: Self-pay

## 2024-01-02 ENCOUNTER — Inpatient Hospital Stay (HOSPITAL_COMMUNITY)
Admission: EM | Admit: 2024-01-02 | Discharge: 2024-01-04 | DRG: 065 | Disposition: A | Discharge status: Home / Self Care | Payer: 59 | Attending: Internal Medicine | Admitting: Internal Medicine

## 2024-01-02 DIAGNOSIS — Z833 Family history of diabetes mellitus: Secondary | ICD-10-CM

## 2024-01-02 DIAGNOSIS — E1165 Type 2 diabetes mellitus with hyperglycemia: Secondary | ICD-10-CM | POA: Diagnosis present

## 2024-01-02 DIAGNOSIS — Z8616 Personal history of COVID-19: Secondary | ICD-10-CM

## 2024-01-02 DIAGNOSIS — I63411 Cerebral infarction due to embolism of right middle cerebral artery: Principal | ICD-10-CM | POA: Diagnosis present

## 2024-01-02 DIAGNOSIS — K222 Esophageal obstruction: Secondary | ICD-10-CM | POA: Diagnosis present

## 2024-01-02 DIAGNOSIS — Z91119 Patient's noncompliance with dietary regimen due to unspecified reason: Secondary | ICD-10-CM

## 2024-01-02 DIAGNOSIS — Z8489 Family history of other specified conditions: Secondary | ICD-10-CM

## 2024-01-02 DIAGNOSIS — E1142 Type 2 diabetes mellitus with diabetic polyneuropathy: Secondary | ICD-10-CM | POA: Diagnosis not present

## 2024-01-02 DIAGNOSIS — D72829 Elevated white blood cell count, unspecified: Secondary | ICD-10-CM | POA: Diagnosis present

## 2024-01-02 DIAGNOSIS — Z825 Family history of asthma and other chronic lower respiratory diseases: Secondary | ICD-10-CM

## 2024-01-02 DIAGNOSIS — H53462 Homonymous bilateral field defects, left side: Secondary | ICD-10-CM | POA: Diagnosis present

## 2024-01-02 DIAGNOSIS — Z95818 Presence of other cardiac implants and grafts: Secondary | ICD-10-CM

## 2024-01-02 DIAGNOSIS — M810 Age-related osteoporosis without current pathological fracture: Secondary | ICD-10-CM | POA: Diagnosis present

## 2024-01-02 DIAGNOSIS — Z7902 Long term (current) use of antithrombotics/antiplatelets: Secondary | ICD-10-CM

## 2024-01-02 DIAGNOSIS — R471 Dysarthria and anarthria: Secondary | ICD-10-CM | POA: Diagnosis present

## 2024-01-02 DIAGNOSIS — Z951 Presence of aortocoronary bypass graft: Secondary | ICD-10-CM

## 2024-01-02 DIAGNOSIS — Z91148 Patient's other noncompliance with medication regimen for other reason: Secondary | ICD-10-CM

## 2024-01-02 DIAGNOSIS — I2510 Atherosclerotic heart disease of native coronary artery without angina pectoris: Secondary | ICD-10-CM | POA: Diagnosis present

## 2024-01-02 DIAGNOSIS — E11319 Type 2 diabetes mellitus with unspecified diabetic retinopathy without macular edema: Secondary | ICD-10-CM | POA: Diagnosis present

## 2024-01-02 DIAGNOSIS — R29810 Facial weakness: Secondary | ICD-10-CM | POA: Diagnosis present

## 2024-01-02 DIAGNOSIS — Z7982 Long term (current) use of aspirin: Secondary | ICD-10-CM

## 2024-01-02 DIAGNOSIS — E1169 Type 2 diabetes mellitus with other specified complication: Secondary | ICD-10-CM | POA: Diagnosis not present

## 2024-01-02 DIAGNOSIS — Z818 Family history of other mental and behavioral disorders: Secondary | ICD-10-CM

## 2024-01-02 DIAGNOSIS — K219 Gastro-esophageal reflux disease without esophagitis: Secondary | ICD-10-CM | POA: Diagnosis present

## 2024-01-02 DIAGNOSIS — I6389 Other cerebral infarction: Secondary | ICD-10-CM | POA: Diagnosis not present

## 2024-01-02 DIAGNOSIS — I639 Cerebral infarction, unspecified: Principal | ICD-10-CM | POA: Diagnosis present

## 2024-01-02 DIAGNOSIS — E1151 Type 2 diabetes mellitus with diabetic peripheral angiopathy without gangrene: Secondary | ICD-10-CM | POA: Diagnosis present

## 2024-01-02 DIAGNOSIS — I5042 Chronic combined systolic (congestive) and diastolic (congestive) heart failure: Secondary | ICD-10-CM | POA: Diagnosis present

## 2024-01-02 DIAGNOSIS — Z66 Do not resuscitate: Secondary | ICD-10-CM | POA: Diagnosis present

## 2024-01-02 DIAGNOSIS — J449 Chronic obstructive pulmonary disease, unspecified: Secondary | ICD-10-CM | POA: Diagnosis present

## 2024-01-02 DIAGNOSIS — Z7901 Long term (current) use of anticoagulants: Secondary | ICD-10-CM

## 2024-01-02 DIAGNOSIS — Z79899 Other long term (current) drug therapy: Secondary | ICD-10-CM

## 2024-01-02 DIAGNOSIS — Z809 Family history of malignant neoplasm, unspecified: Secondary | ICD-10-CM

## 2024-01-02 DIAGNOSIS — I10 Essential (primary) hypertension: Secondary | ICD-10-CM | POA: Diagnosis not present

## 2024-01-02 DIAGNOSIS — R29703 NIHSS score 3: Secondary | ICD-10-CM | POA: Diagnosis present

## 2024-01-02 DIAGNOSIS — Z8269 Family history of other diseases of the musculoskeletal system and connective tissue: Secondary | ICD-10-CM

## 2024-01-02 DIAGNOSIS — Z5986 Financial insecurity: Secondary | ICD-10-CM

## 2024-01-02 DIAGNOSIS — I513 Intracardiac thrombosis, not elsewhere classified: Secondary | ICD-10-CM | POA: Diagnosis present

## 2024-01-02 DIAGNOSIS — G8194 Hemiplegia, unspecified affecting left nondominant side: Secondary | ICD-10-CM | POA: Diagnosis present

## 2024-01-02 DIAGNOSIS — Z800 Family history of malignant neoplasm of digestive organs: Secondary | ICD-10-CM

## 2024-01-02 DIAGNOSIS — Z8673 Personal history of transient ischemic attack (TIA), and cerebral infarction without residual deficits: Secondary | ICD-10-CM

## 2024-01-02 DIAGNOSIS — Z6835 Body mass index (BMI) 35.0-35.9, adult: Secondary | ICD-10-CM

## 2024-01-02 DIAGNOSIS — I252 Old myocardial infarction: Secondary | ICD-10-CM

## 2024-01-02 DIAGNOSIS — E119 Type 2 diabetes mellitus without complications: Secondary | ICD-10-CM

## 2024-01-02 DIAGNOSIS — I739 Peripheral vascular disease, unspecified: Secondary | ICD-10-CM | POA: Diagnosis present

## 2024-01-02 DIAGNOSIS — I110 Hypertensive heart disease with heart failure: Secondary | ICD-10-CM | POA: Diagnosis present

## 2024-01-02 DIAGNOSIS — Z89512 Acquired absence of left leg below knee: Secondary | ICD-10-CM

## 2024-01-02 DIAGNOSIS — F330 Major depressive disorder, recurrent, mild: Secondary | ICD-10-CM | POA: Diagnosis present

## 2024-01-02 DIAGNOSIS — E669 Obesity, unspecified: Secondary | ICD-10-CM | POA: Diagnosis present

## 2024-01-02 DIAGNOSIS — K3184 Gastroparesis: Secondary | ICD-10-CM | POA: Diagnosis present

## 2024-01-02 DIAGNOSIS — Z8249 Family history of ischemic heart disease and other diseases of the circulatory system: Secondary | ICD-10-CM

## 2024-01-02 DIAGNOSIS — E785 Hyperlipidemia, unspecified: Secondary | ICD-10-CM | POA: Diagnosis present

## 2024-01-02 DIAGNOSIS — E1143 Type 2 diabetes mellitus with diabetic autonomic (poly)neuropathy: Secondary | ICD-10-CM | POA: Diagnosis present

## 2024-01-02 DIAGNOSIS — Z794 Long term (current) use of insulin: Secondary | ICD-10-CM

## 2024-01-02 LAB — DIFFERENTIAL
Abs Immature Granulocytes: 0.05 10*3/uL (ref 0.00–0.07)
Basophils Absolute: 0.1 10*3/uL (ref 0.0–0.1)
Basophils Relative: 1 %
Eosinophils Absolute: 0.3 10*3/uL (ref 0.0–0.5)
Eosinophils Relative: 3 %
Immature Granulocytes: 1 %
Lymphocytes Relative: 16 %
Lymphs Abs: 1.8 10*3/uL (ref 0.7–4.0)
Monocytes Absolute: 0.7 10*3/uL (ref 0.1–1.0)
Monocytes Relative: 6 %
Neutro Abs: 8.3 10*3/uL — ABNORMAL HIGH (ref 1.7–7.7)
Neutrophils Relative %: 73 %

## 2024-01-02 LAB — CBC
HCT: 43.1 % (ref 39.0–52.0)
Hemoglobin: 14.6 g/dL (ref 13.0–17.0)
MCH: 30.2 pg (ref 26.0–34.0)
MCHC: 33.9 g/dL (ref 30.0–36.0)
MCV: 89 fL (ref 80.0–100.0)
Platelets: 264 10*3/uL (ref 150–400)
RBC: 4.84 MIL/uL (ref 4.22–5.81)
RDW: 13.3 % (ref 11.5–15.5)
WBC: 11.1 10*3/uL — ABNORMAL HIGH (ref 4.0–10.5)
nRBC: 0 % (ref 0.0–0.2)

## 2024-01-02 LAB — COMPREHENSIVE METABOLIC PANEL
ALT: 9 U/L (ref 0–44)
AST: 21 U/L (ref 15–41)
Albumin: 3.5 g/dL (ref 3.5–5.0)
Alkaline Phosphatase: 57 U/L (ref 38–126)
Anion gap: 10 (ref 5–15)
BUN: 13 mg/dL (ref 6–20)
CO2: 22 mmol/L (ref 22–32)
Calcium: 8.8 mg/dL — ABNORMAL LOW (ref 8.9–10.3)
Chloride: 99 mmol/L (ref 98–111)
Creatinine, Ser: 0.9 mg/dL (ref 0.61–1.24)
GFR, Estimated: >60 mL/min (ref >60)
Glucose, Bld: 227 mg/dL — ABNORMAL HIGH (ref 70–99)
Potassium: 4.4 mmol/L (ref 3.5–5.1)
Sodium: 131 mmol/L — ABNORMAL LOW (ref 135–145)
Total Bilirubin: 1.6 mg/dL — ABNORMAL HIGH (ref 0.0–1.2)
Total Protein: 6.5 g/dL (ref 6.5–8.1)

## 2024-01-02 LAB — I-STAT CHEM 8, ED
BUN: 16 mg/dL (ref 6–20)
Calcium, Ion: 1.07 mmol/L — ABNORMAL LOW (ref 1.15–1.40)
Chloride: 102 mmol/L (ref 98–111)
Creatinine, Ser: 0.9 mg/dL (ref 0.61–1.24)
Glucose, Bld: 233 mg/dL — ABNORMAL HIGH (ref 70–99)
HCT: 44 % (ref 39.0–52.0)
Hemoglobin: 15 g/dL (ref 13.0–17.0)
Potassium: 4.4 mmol/L (ref 3.5–5.1)
Sodium: 135 mmol/L (ref 135–145)
TCO2: 26 mmol/L (ref 22–32)

## 2024-01-02 LAB — ECHOCARDIOGRAM COMPLETE
AR max vel: 3.3 cm2
AV Area VTI: 3.08 cm2
AV Area mean vel: 2.96 cm2
AV Mean grad: 3 mm[Hg]
AV Peak grad: 4.2 mm[Hg]
Ao pk vel: 1.03 m/s
Area-P 1/2: 4.6 cm2
Height: 74 in
MV VTI: 4.46 cm2
S' Lateral: 3.9 cm
Single Plane A4C EF: 45.2 %
Weight: 4409.2 [oz_av]

## 2024-01-02 LAB — HEMOGLOBIN A1C
Hgb A1c MFr Bld: 7.7 % — ABNORMAL HIGH (ref 4.8–5.6)
Mean Plasma Glucose: 174.29 mg/dL

## 2024-01-02 LAB — CBG MONITORING, ED: Glucose-Capillary: 229 mg/dL — ABNORMAL HIGH (ref 70–99)

## 2024-01-02 LAB — APTT: aPTT: 32 s (ref 24–36)

## 2024-01-02 LAB — ETHANOL: Alcohol, Ethyl (B): <10 mg/dL (ref <10)

## 2024-01-02 LAB — PROTIME-INR
INR: 1.1 (ref 0.8–1.2)
Prothrombin Time: 14.5 s (ref 11.4–15.2)

## 2024-01-02 MED ORDER — INSULIN ASPART 100 UNIT/ML IJ SOLN
0.0000 [IU] | INTRAMUSCULAR | Status: DC
  Administered 2024-01-03 (×2): 3 [IU] via SUBCUTANEOUS
  Administered 2024-01-03 (×2): 2 [IU] via SUBCUTANEOUS

## 2024-01-02 MED ORDER — SENNOSIDES-DOCUSATE SODIUM 8.6-50 MG PO TABS: 1.0000 | ORAL_TABLET | Freq: Every evening | ORAL | Status: DC | PRN

## 2024-01-02 MED ORDER — PANTOPRAZOLE SODIUM 40 MG PO TBEC
40.0000 mg | DELAYED_RELEASE_TABLET | Freq: Every day | ORAL | Status: DC
Start: 2024-01-03 — End: 2024-01-04
  Administered 2024-01-03 – 2024-01-04 (×2): 40 mg via ORAL
  Filled 2024-01-02 (×2): qty 1

## 2024-01-02 MED ORDER — ROSUVASTATIN CALCIUM 20 MG PO TABS
40.0000 mg | ORAL_TABLET | Freq: Every day | ORAL | Status: DC
  Administered 2024-01-02 – 2024-01-04 (×3): 40 mg via ORAL
  Filled 2024-01-02 (×3): qty 2

## 2024-01-02 MED ORDER — ASPIRIN 81 MG PO TBEC
81.0000 mg | DELAYED_RELEASE_TABLET | Freq: Every day | ORAL | Status: DC
  Administered 2024-01-02 – 2024-01-03 (×2): 81 mg via ORAL
  Filled 2024-01-02 (×2): qty 1

## 2024-01-02 MED ORDER — METOCLOPRAMIDE HCL 10 MG PO TABS: 10.0000 mg | ORAL_TABLET | Freq: Three times a day (TID) | ORAL | Status: DC | PRN

## 2024-01-02 MED ORDER — ACETAMINOPHEN 650 MG RE SUPP: 650.0000 mg | RECTAL | Status: DC | PRN

## 2024-01-02 MED ORDER — ENOXAPARIN SODIUM 40 MG/0.4ML IJ SOSY
40.0000 mg | PREFILLED_SYRINGE | Freq: Every day | INTRAMUSCULAR | Status: DC
  Administered 2024-01-02 – 2024-01-03 (×2): 40 mg via SUBCUTANEOUS
  Filled 2024-01-02 (×2): qty 0.4

## 2024-01-02 MED ORDER — ONDANSETRON HCL 4 MG/2ML IJ SOLN
4.0000 mg | Freq: Four times a day (QID) | INTRAMUSCULAR | Status: DC | PRN
  Administered 2024-01-02 – 2024-01-04 (×3): 4 mg via INTRAVENOUS
  Filled 2024-01-02 (×4): qty 2

## 2024-01-02 MED ORDER — CLOPIDOGREL BISULFATE 75 MG PO TABS
75.0000 mg | ORAL_TABLET | Freq: Every day | ORAL | Status: DC
  Administered 2024-01-03: 75 mg via ORAL
  Filled 2024-01-02: qty 1

## 2024-01-02 MED ORDER — IOHEXOL 350 MG/ML SOLN
75.0000 mL | Freq: Once | INTRAVENOUS | Status: AC | PRN
  Administered 2024-01-02: 75 mL via INTRAVENOUS

## 2024-01-02 MED ORDER — ACETAMINOPHEN 325 MG PO TABS: 650.0000 mg | ORAL_TABLET | ORAL | Status: DC | PRN

## 2024-01-02 MED ORDER — SODIUM CHLORIDE 0.9% FLUSH
3.0000 mL | Freq: Once | INTRAVENOUS | Status: AC
  Administered 2024-01-02: 3 mL via INTRAVENOUS

## 2024-01-02 MED ORDER — CLOPIDOGREL BISULFATE 300 MG PO TABS
300.0000 mg | ORAL_TABLET | Freq: Once | ORAL | Status: AC
  Administered 2024-01-02: 300 mg via ORAL
  Filled 2024-01-02: qty 1

## 2024-01-02 MED ORDER — STROKE: EARLY STAGES OF RECOVERY BOOK
Freq: Once | Status: AC
  Filled 2024-01-02: qty 1

## 2024-01-02 MED ORDER — ACETAMINOPHEN 160 MG/5ML PO SOLN: 650.0000 mg | ORAL | Status: DC | PRN

## 2024-01-02 MED ORDER — PERFLUTREN LIPID MICROSPHERE
1.0000 mL | INTRAVENOUS | Status: AC | PRN
  Administered 2024-01-02: 3 mL via INTRAVENOUS

## 2024-01-02 NOTE — H&P (Signed)
History and Physical   Travis [name]:[id number] DOB: 03-02-1971 DOA: 01/02/2024  PCP: Jettie Pagan, NP   Patient coming from: Home  Chief Complaint: Code stroke, left-sided deficits  HPI: Travis [name] is a 53 y.o. male with medical history significant of hypertension, hyperlipidemia, CVA, GERD, diabetes, gastroparesis, neuropathy, PAD, CHF, BKA, depression presenting with left-sided deficits as a code stroke.  Patient went to bed on the left or midnight last night and woke up around 5 AM with dysarthria, left-sided weakness, left-sided facial droop.  Presented to the ED following this.  Deficits has persisted in the ED.  Denies fevers, chills, chest pain, shortness of breath, abdominal pain, constipation, diarrhea, nausea, vomiting.  Has history of prior CVA that was believed to be embolic but no source identified in 2019.  Has residual left-sided upper quadrantanopia.  Has not been taking his aspirin, Plavix, atorvastatin in the setting of it upsetting his stomach.  ED Course: Vital signs in the ED notable for blood pressure in the 130s to 190 systolic.  Lab workup included CMP with sodium 131 which improved to 132/133 considering glucose of 227, calcium 8.8, T. bili 1.6.  CBC with mild leukocytosis to 11.1.  PT, PTT, INR within normal limits.  Ethanol level negative.  A1c 7.7.  Lipid panel pending.  CT head showed chronic infarcts but no acute abnormality.  CTA of the head and neck showed occluded right M2 with distal reconstitution, short segment occlusion at right vertebral origin, and intracranial atherosclerosis including moderate right V4 and severe left P3 stenosis.  MRI brain performed in confirmed acute right MCA territory infarcts without hemorrhage or mass effect.  Echocardiogram already been performed which showed suspected apical LV thrombus.  Neurology consulted and are following.  Review of Systems: As per HPI otherwise all other systems reviewed and are  negative.  Past Medical History:  Diagnosis Date   Acute on chronic systolic and diastolic heart failure, NYHA class 3 (HCC) 09/14/2017   Acute osteomyelitis of metatarsal bone of left foot (HCC) 11/12/2015   CAD in native artery 09/14/2017   Closed fracture of right tibial plateau 11/11/2015   Coronary artery disease    Dehiscence of amputation stump (HCC)    left leg   Depression with anxiety    Diabetes mellitus    INSULIN DEPENDENT Type II   Diabetic peripheral neuropathy associated with type 2 diabetes mellitus (HCC) 08/30/2008   Qualifier: Diagnosis of  By: Daphine Deutscher FNP, [place]     Diabetic ulcer of left foot (HCC) 11/14/2015   Gastroparesis    GERD (gastroesophageal reflux disease)    History of loop recorder 06/2018   Last remote device check was on 02/14/2019   Hypertension    patient denies   Left ventricular aneurysm    REPORTS HE IS NOT AWAREW OF THIS    Myocardial infarction (HCC)    Osteomyelitis (HCC)    Osteoporosis 11/12/2015   Peripheral neuropathy    feet and below bilateral knees   Peripheral vascular disease (HCC)    PAD in Left leg and some in right leg   Pressure injury of left foot, stage 2 (HCC) 02/14/2019   Sepsis (HCC) 10/29/2018   Shortness of breath dyspnea    Stroke (HCC) 05/2018   Subacute osteomyelitis, left ankle and foot (HCC) 10/20/2016   Vitamin D deficiency 11/12/2015    Past Surgical History:  Procedure Laterality Date   AMPUTATION Left 11/28/2015   Procedure: Left 4th Ray Amputation;  Surgeon:  Nadara Mustard, MD;  Location: [facility] OR;  Service: Orthopedics;  Laterality: Left;   AMPUTATION Left 10/29/2016   Procedure: LEFT 5TH RAY AMPUTATION;  Surgeon: Nadara Mustard, MD;  Location: [facility] OR;  Service: Orthopedics;  Laterality: Left;   AMPUTATION Left 03/09/2019   Procedure: LEFT FOOT 5TH RAY AMPUTATION;  Surgeon: Nadara Mustard, MD;  Location: [facility] OR;  Service: Orthopedics;  Laterality: Left;   AMPUTATION Left 04/18/2019   Procedure: LEFT BELOW  KNEE AMPUTATION;  Surgeon: Nadara Mustard, MD;  Location: [facility] OR;  Service: Orthopedics;  Laterality: Left;   ANTERIOR VITRECTOMY Left 04/12/2016   Procedure: ANTERIOR CHAMBER WASH OUT WITH GAS FLUID EXCHANGE;  Surgeon: Carmela Rima, MD;  Location: [facility] OR;  Service: Ophthalmology;  Laterality: Left;   CARDIAC CATHETERIZATION  10/2017   CATARACT EXTRACTION Left    CHOLECYSTECTOMY N/A 01/04/2019   Procedure: LAPAROSCOPIC CHOLECYSTECTOMY;  Surgeon: Gaynelle Adu, MD;  Location: [facility] ORS;  Service: General;  Laterality: N/A;   CORONARY ARTERY BYPASS GRAFT N/A 11/29/2017    LIMA-LAD, SVG-PDA, SVG-CFX  Procedure: CORONARY ARTERY BYPASS GRAFTING (CABG) times three using the left saphaneous vien. harvested endoscopicly and left internal mammary artery.;  Surgeon: Kerin Perna, MD;  Location: [facility] OR;  Service: Open Heart Surgery;  Laterality: N/A;   EYE SURGERY     left eye surgery 04/2016   IR CHOLANGIOGRAM EXISTING TUBE  10/17/2018   IR CHOLANGIOGRAM EXISTING TUBE  10/30/2018   IR PERC CHOLECYSTOSTOMY  09/05/2018   KNEE SURGERY Left    LOOP RECORDER INSERTION N/A 06/28/2018   Procedure: LOOP RECORDER INSERTION;  Surgeon: Duke Salvia, MD;  Location: [facility] INVASIVE CV LAB;  Service: Cardiovascular;  Laterality: N/A;   RIGHT/LEFT HEART CATH AND CORONARY ANGIOGRAPHY N/A 10/19/2017   Procedure: RIGHT/LEFT HEART CATH AND CORONARY ANGIOGRAPHY;  Surgeon: Swaziland, [name], MD;  Location: [facility] INVASIVE CV LAB;  Service: Cardiovascular;  Laterality: N/A;   SHOULDER SURGERY Right    TEE WITHOUT CARDIOVERSION N/A 11/29/2017   Procedure: TRANSESOPHAGEAL ECHOCARDIOGRAM (TEE);  Surgeon: Donata Clay, Theron Arista, MD;  Location: [facility] OR;  Service: Open Heart Surgery;  Laterality: N/A;   TEE WITHOUT CARDIOVERSION N/A 06/28/2018   Procedure: TRANSESOPHAGEAL ECHOCARDIOGRAM (TEE);  Surgeon: Jake Bathe, MD;  Location: [facility] ENDOSCOPY;  Service: Cardiovascular;  Laterality: N/A;   TOE AMPUTATION Left 11/28/15   4th toe    Social History   reports that he has never smoked. He has never used smokeless tobacco. He reports that he does not drink alcohol and does not use drugs.  No Known Allergies  Family History  Problem Relation Age of Onset   Cancer Paternal Grandmother    COPD Mother    Heart failure Mother    Anxiety disorder Mother    Depression Mother    Fibromyalgia Mother    Esophageal cancer Father        Smoker, still does   Diabetes Sister    Fibromyalgia Sister    Diabetes Maternal Uncle    Diabetes Maternal Grandmother    GER disease Sister    Liver disease Paternal Grandfather    Hemachromatosis Paternal Grandfather    Diabetes Maternal Grandfather   Reviewed on admission  Prior to Admission medications   Medication Sig Start Date End Date Taking? Authorizing Provider  aspirin EC 325 MG EC tablet Take 1 tablet (325 mg total) by mouth daily. Patient taking differently: Take 325 mg by mouth at bedtime. 12/04/17  Yes Barrett, Rae Roam, PA-C  clopidogrel (PLAVIX) 75 MG tablet TAKE 1 TABLET(75 MG) BY MOUTH DAILY 07/25/20  Yes Waldon Merl, PA-C  furosemide (LASIX) 20 MG tablet TAKE 1 TABLET(20 MG) BY MOUTH DAILY 10/29/19  Yes Chilton Si, MD  insulin glargine (LANTUS SOLOSTAR) 100 UNIT/ML Solostar Pen Inject 80 Units into the skin every morning. And pen needles 1/day 06/06/23  Yes Motwani, Komal, MD  insulin lispro (HUMALOG) 100 UNIT/ML KwikPen Inject 0-12 Units into the skin 3 (three) times daily. Inject 0-12 units per sliding scale under the skin with meals. 11/03/23  Yes [provider]  metoCLOPramide (REGLAN) 10 MG tablet Take 1 tablet (10 mg total) by mouth every 8 (eight) hours as needed for nausea. Patient taking differently: Take 10 mg by mouth daily. 09/17/20  Yes Waldon Merl, PA-C  metoprolol succinate (TOPROL-XL) 25 MG 24 hr tablet TAKE 1/2 TABLET BY MOUTH EVERY DAY 07/28/20  Yes Chilton Si, MD  omeprazole (PRILOSEC) 40 MG capsule Take 40 mg by mouth daily.   Yes [provider]  ondansetron (ZOFRAN ODT) 4 MG disintegrating tablet Take 1 tablet (4 mg total) by mouth every 8 (eight) hours as needed for nausea or vomiting. 09/27/20  Yes Lorelee New, PA-C  ramipril (ALTACE) 2.5 MG capsule TAKE 1 CAPSULE(2.5 MG) BY MOUTH DAILY 10/26/21  Yes Worthy Rancher B, FNP  rosuvastatin (CRESTOR) 40 MG tablet TAKE 1 TABLET(40 MG) BY MOUTH DAILY 11/10/20  Yes Chilton Si, MD  Blood Glucose Monitoring Suppl (ONETOUCH VERIO) w/Device KIT 1 application by Does not apply route 4 (four) times daily. 09/13/18   Durward Parcel, DO  Continuous Blood Gluc Sensor (FREESTYLE LIBRE 2 SENSOR) MISC 1 Device by Does not apply route every 14 (fourteen) days. 04/06/22   Romero Belling, MD  glucose blood [facility] VERIO) test strip 1 each by Other route as needed for other. Use as instructed 09/18/18   Durward Parcel, DO  ibuprofen (ADVIL) 400 MG tablet Take 1 tablet (400 mg total) by mouth 4 (four) times daily. Patient taking differently: Take 400 mg by mouth every 6 (six) hours as needed for moderate pain (pain score 4-6). 09/27/20   Lorelee New, PA-C  Insulin Pen Needle 31G X 5 MM MISC Use as directed to inject insulin. Dx Code:e11.43 12/04/19   Nestor Ramp, MD  [facility] DELICA LANCETS 33G MISC 1 application by Does not apply route 4 (four) times daily. 09/18/18   Durward Parcel, DO    Physical Exam: Vitals:   01/02/24 0845 01/02/24 0920 01/02/24 1015 01/02/24 1210  BP: (!) 153/73 (!) 159/74 (!) 160/82 139/66  Pulse:  79 75 77  Resp: 18 16 13 16   Temp:    98.2 F (36.8 C)  TempSrc:      SpO2:  96% 100% 97%  Weight:      Height:        Physical Exam Constitutional:      General: He is not in acute distress.    Appearance: Normal appearance.  HENT:     Head: Normocephalic and atraumatic.     Mouth/Throat:     Mouth: Mucous membranes are moist.     Pharynx: Oropharynx is clear.  Eyes:     Extraocular Movements: Extraocular movements intact.     Pupils:  Pupils are equal, round, and reactive to light.  Cardiovascular:     Rate and Rhythm: Normal rate and regular rhythm.     Pulses: Normal pulses.     Heart sounds: Normal heart  sounds.  Pulmonary:     Effort: Pulmonary effort is normal. No respiratory distress.     Breath sounds: Normal breath sounds.  Abdominal:     General: Bowel sounds are normal. There is no distension.     Palpations: Abdomen is soft.     Tenderness: There is no abdominal tenderness.  Musculoskeletal:        General: No swelling or deformity.  Skin:    General: Skin is warm and dry.  Neurological:     Comments: Mental Status: Patient is awake, alert, oriented No signs of aphasia or neglect Cranial Nerves: II: Pupils equal, round, and reactive to light.   III,IV, VI: EOMI without ptosis or diploplia.  V: Facial sensation is symmetric to light touch. VII: Facial movement is decreased on left. VIII: hearing is intact to voice X: Uvula elevates symmetrically XI: Shoulder shrug is decreased on left. XII: tongue is midline without atrophy or fasciculations.  Motor: Good effort thorughout, at Least 4/5 LUE, 5/5 RUE, 5/5 bilateral lower extremitiy (status post left BKA) Sensory: Sensation is grossly intact bilateral UEs & LEs (beyond chronic lower extremity neuropathy.) Cerebellar: Finger-Nose intact bilalat    Labs on Admission: I have personally reviewed following labs and imaging studies  CBC: Recent Labs  Lab 01/02/24 0738 01/02/24 0742  WBC 11.1*  --   NEUTROABS 8.3*  --   HGB 14.6 15.0  HCT 43.1 44.0  MCV 89.0  --   PLT 264  --     Basic Metabolic Panel: Recent Labs  Lab 01/02/24 0738 01/02/24 0742  NA 131* 135  K 4.4 4.4  CL 99 102  CO2 22  --   GLUCOSE 227* 233*  BUN 13 16  CREATININE 0.90 0.90  CALCIUM 8.8*  --     GFR: Estimated Creatinine Clearance: 134.9 mL/min (by C-G formula based on SCr of 0.9 mg/dL).  Liver Function Tests: Recent Labs  Lab 01/02/24 0738  AST 21  ALT  9  ALKPHOS 57  BILITOT 1.6*  PROT 6.5  ALBUMIN 3.5    Urine analysis:    Component Value Date/Time   COLORURINE YELLOW 09/27/2020 1003   APPEARANCEUR HAZY (A) 09/27/2020 1003   LABSPEC 1.022 09/27/2020 1003   PHURINE 5.0 09/27/2020 1003   GLUCOSEU >=500 (A) 09/27/2020 1003   GLUCOSEU >=1000 (A) 07/22/2020 1452   HGBUR LARGE (A) 09/27/2020 1003   HGBUR negative 09/17/2009 0834   BILIRUBINUR NEGATIVE 09/27/2020 1003   KETONESUR NEGATIVE 09/27/2020 1003   PROTEINUR 100 (A) 09/27/2020 1003   UROBILINOGEN 0.2 07/22/2020 1452   NITRITE NEGATIVE 09/27/2020 1003   LEUKOCYTESUR NEGATIVE 09/27/2020 1003    Radiological Exams on Admission: ECHOCARDIOGRAM COMPLETE Result Date: 01/02/2024    ECHOCARDIOGRAM REPORT   Patient Name:   DWANYE TIPPENS Date of Exam: 01/02/2024 Medical Rec #:  [id number]        Height:       74.0 in Accession #:    [id number]       Weight:       275.6 lb Date of Birth:  07-03-1971         BSA:          2.491 m Patient Age:    52 years         BP:           163/70 mmHg Patient Gender: M                HR:  80 bpm. Exam Location:  Inpatient Procedure: 2D Echo, Cardiac Doppler, Color Doppler and Intracardiac            Opacification Agent Indications:    Stroke  History:        Patient has prior history of Echocardiogram examinations, most                 recent 06/25/2018. CAD; Risk Factors:Diabetes and Hypertension.  Sonographer:    Vern Claude Referring Phys: [id number] DEVON SHAFER  Sonographer Comments: Technically difficult study due to poor echo windows and suboptimal subcostal window. IMPRESSIONS  1. Possible small LV apical thrombus (clip 78-79). Image quality is suboptimal. In setting of stroke, consider cardiac MRI to further assess.  2. Left ventricular ejection fraction, by estimation, is 45 to 50%. The left ventricle has mildly decreased function. Left ventricular endocardial border not optimally defined to evaluate regional wall motion. There is mild left  ventricular hypertrophy.  Indeterminate diastolic filling due to E-A fusion. There is abnormal septal motion.  3. Right ventricular systolic function is normal. The right ventricular size is normal. Tricuspid regurgitation signal is inadequate for assessing PA pressure.  4. The mitral valve is grossly normal. Trivial mitral valve regurgitation.  5. The aortic valve was not well visualized. Aortic valve regurgitation is not visualized. No aortic stenosis is present.  6. The inferior vena cava is normal in size with <50% respiratory variability, suggesting right atrial pressure of 8 mmHg. FINDINGS  Left Ventricle: Left ventricular ejection fraction, by estimation, is 45 to 50%. The left ventricle has mildly decreased function. Left ventricular endocardial border not optimally defined to evaluate regional wall motion. The left ventricular internal cavity size was normal in size. There is mild left ventricular hypertrophy. Abnormal septal motion. Indeterminate diastolic filling due to E-A fusion. Right Ventricle: The right ventricular size is normal. Right vetricular wall thickness was not well visualized. Right ventricular systolic function is normal. Tricuspid regurgitation signal is inadequate for assessing PA pressure. Left Atrium: Left atrial size was normal in size. Right Atrium: Right atrial size was normal in size. Pericardium: There is no evidence of pericardial effusion. Mitral Valve: The mitral valve is grossly normal. Trivial mitral valve regurgitation. MV peak gradient, 2.8 mmHg. The mean mitral valve gradient is 2.0 mmHg. Tricuspid Valve: The tricuspid valve is not well visualized. Tricuspid valve regurgitation is trivial. Aortic Valve: The aortic valve was not well visualized. Aortic valve regurgitation is not visualized. No aortic stenosis is present. Aortic valve mean gradient measures 3.0 mmHg. Aortic valve peak gradient measures 4.2 mmHg. Aortic valve area, by VTI measures 3.08 cm. Pulmonic Valve:  The pulmonic valve was not well visualized. Pulmonic valve regurgitation is trivial. Aorta: The aortic root is normal in size and structure. Venous: The inferior vena cava was not well visualized. The inferior vena cava is normal in size with less than 50% respiratory variability, suggesting right atrial pressure of 8 mmHg. IAS/Shunts: The interatrial septum was not well visualized.  LEFT VENTRICLE PLAX 2D LVIDd:         5.10 cm      Diastology LVIDs:         3.90 cm      LV e' medial:    6.64 cm/s LV PW:         1.10 cm      LV E/e' medial:  13.2 LV IVS:        0.80 cm      LV e' lateral:  8.70 cm/s LVOT diam:     2.20 cm      LV E/e' lateral: 10.1 LV SV:         75 LV SV Index:   30 LVOT Area:     3.80 cm  LV Volumes (MOD) LV vol d, MOD A4C: 199.0 ml LV vol s, MOD A4C: 109.0 ml LV SV MOD A4C:     199.0 ml RIGHT VENTRICLE            IVC RV Basal diam:  3.60 cm    IVC diam: 1.50 cm RV S prime:     6.74 cm/s TAPSE (M-mode): 1.7 cm LEFT ATRIUM             Index        RIGHT ATRIUM           Index LA diam:        4.00 cm 1.61 cm/m   RA Area:     16.50 cm LA Vol (A2C):   74.3 ml 29.83 ml/m  RA Volume:   41.40 ml  16.62 ml/m LA Vol (A4C):   73.1 ml 29.35 ml/m LA Biplane Vol: 75.7 ml 30.39 ml/m  AORTIC VALVE                    PULMONIC VALVE AV Area (Vmax):    3.30 cm     PV Vmax:       0.86 m/s AV Area (Vmean):   2.96 cm     PV Peak grad:  3.0 mmHg AV Area (VTI):     3.08 cm AV Vmax:           103.00 cm/s AV Vmean:          82.200 cm/s AV VTI:            0.242 m AV Peak Grad:      4.2 mmHg AV Mean Grad:      3.0 mmHg LVOT Vmax:         89.40 cm/s LVOT Vmean:        64.100 cm/s LVOT VTI:          0.196 m LVOT/AV VTI ratio: 0.81  AORTA Ao Root diam: 3.10 cm Ao Asc diam:  3.00 cm MITRAL VALVE MV Area (PHT): 4.60 cm    SHUNTS MV Area VTI:   4.46 cm    Systemic VTI:  0.20 m MV Peak grad:  2.8 mmHg    Systemic Diam: 2.20 cm MV Mean grad:  2.0 mmHg MV Vmax:       0.83 m/s MV Vmean:      65.1 cm/s MV Decel Time: 165  msec MV E velocity: 87.50 cm/s MV A velocity: 78.60 cm/s MV E/A ratio:  1.11 Weston Brass MD Electronically signed by Weston Brass MD Signature Date/Time: 01/02/2024/11:57:47 AM    Final    MR BRAIN WO CONTRAST Result Date: 01/02/2024 CLINICAL DATA:  Stroke, follow up EXAM: MRI HEAD WITHOUT CONTRAST TECHNIQUE: Multiplanar, multiecho pulse sequences of the brain and surrounding structures were obtained without intravenous contrast. COMPARISON:  Head CT 01/02/2024 FINDINGS: Brain: Acute infarcts in the right MCA territory involving the body of the caudate on the right, right insular cortex, right frontal lobe. No hemorrhage. No hydrocephalus. No extra-axial fluid collection. Chronic left thalamic and right occipital no mass effect. No mass lesion. Vascular: Normal flow voids. Skull and upper cervical spine: Normal marrow signal. Sinuses/Orbits: Trace left mastoid effusion. No middle ear  effusion. Paranasal sinuses are clear. Left lens replacement. Orbits are otherwise unremarkable. Other: None. IMPRESSION: Acute infarcts in the right MCA territory involving the body of the caudate on the right, right insular cortex, and right frontal lobe. No hemorrhage or mass effect. Electronically Signed   By: Lorenza Cambridge M.D.   On: 01/02/2024 10:16   CT ANGIO HEAD NECK W WO CM (CODE STROKE) Result Date: 01/02/2024 CLINICAL DATA:  Neuro deficit, acute, stroke suspected. Slurred speech. EXAM: CT ANGIOGRAPHY HEAD AND NECK WITH AND WITHOUT CONTRAST TECHNIQUE: Multidetector CT imaging of the head and neck was performed using the standard protocol during bolus administration of intravenous contrast. Multiplanar CT image reconstructions and MIPs were obtained to evaluate the vascular anatomy. Carotid stenosis measurements (when applicable) are obtained utilizing NASCET criteria, using the distal internal carotid diameter as the denominator. RADIATION DOSE REDUCTION: This exam was performed according to the departmental  dose-optimization program which includes automated exposure control, adjustment of the mA and/or kV according to patient size and/or use of iterative reconstruction technique. CONTRAST:  75mL OMNIPAQUE IOHEXOL 350 MG/ML SOLN COMPARISON:  CTA head and neck 06/25/2018 FINDINGS: CTA NECK FINDINGS Aortic arch: Normal variant aortic arch branching pattern with common origin of the brachiocephalic and left common carotid arteries. Widely patent arch vessel origins. Right carotid system: Patent without evidence of stenosis or dissection. Left carotid system: Patent without evidence of stenosis or dissection. Vertebral arteries: New short segment occlusion of the right vertebral artery at its origin over an approximately 1 cm long segment with the vessel being patent throughout the remainder of the neck. Patent left vertebral artery with suboptimal assessment of the proximal V1 segment but no evidence of a significant stenosis more distally. Skeleton: Mild cervical spondylosis. Other neck: No evidence of cervical lymphadenopathy or mass. Upper chest: Clear lung apices. Review of the MIP images confirms the above findings CTA HEAD FINDINGS Anterior circulation: The internal carotid arteries are patent from skull base to carotid termini with mild atherosclerosis not resulting in a significant stenosis. ACAs and MCAs are patent proximally without evidence of a significant A1 or M1 stenosis. There is occlusion of a mid right M2 branch vessel with distal reconstitution. No aneurysm is identified. Posterior circulation: The intracranial vertebral arteries are patent to the basilar with moderate multifocal narrowing of the right V4 segment. Patent PICA and SCA origins are visualized bilaterally. The basilar artery is widely patent. There are small right and large left posterior communicating arteries. The PCAs are patent with branch vessel irregularity, including a severe proximal left P3 stenosis with diminished opacification  distally. No aneurysm is identified. Venous sinuses: Patent. Anatomic variants: None. Review of the MIP images confirms the above findings These results were communicated to Dr. Iver Nestle at 8:42 am on 01/02/2024 by text page via the [facility] messaging system. IMPRESSION: 1. Occlusion of a mid right M2 branch vessel with distal reconstitution. 2. Short segment occlusion of the right vertebral artery at its origin. 3. Intracranial atherosclerosis including moderate right V4 and severe left P3 stenoses. Electronically Signed   By: Sebastian Ache M.D.   On: 01/02/2024 08:53   CT HEAD CODE STROKE WO CONTRAST Result Date: 01/02/2024 CLINICAL DATA:  Code stroke.  Slurred speech EXAM: CT HEAD WITHOUT CONTRAST TECHNIQUE: Contiguous axial images were obtained from the base of the skull through the vertex without intravenous contrast. RADIATION DOSE REDUCTION: This exam was performed according to the departmental dose-optimization program which includes automated exposure control, adjustment of the mA and/or kV  according to patient size and/or use of iterative reconstruction technique. COMPARISON:  06/26/2018 brain MRI FINDINGS: Brain: No evidence of acute infarction, hemorrhage, hydrocephalus, extra-axial collection or mass lesion/mass effect. 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 Vascular: No hyperdense vessel. Premature atheromatous calcification. Skull: Normal. Negative for fracture or focal lesion. Sinuses/Orbits: No acute finding Other: Prelim sent to neurology in epic CT. ASPECTS ([place] Stroke Program Early CT Score) - Ganglionic level infarction (caudate, lentiform nuclei, internal capsule, insula, M1-M3 cortex): 7 - Supraganglionic infarction (M4-M6 cortex): 3 Total score (0-10 with 10 being normal): 10 IMPRESSION: Chronic infarcts in the known from 2019. No acute or interval finding. Electronically Signed   By: Tiburcio Pea M.D.   On: 01/02/2024  07:55   EKG: Independently reviewed.  Sinus rhythm at 81 bpm.  Nonspecific T wave changes.  Assessment/Plan Principal Problem:   Acute CVA (cerebrovascular accident) (HCC) Active Problems:   Diabetes (HCC)   Diabetic peripheral neuropathy associated with type 2 diabetes mellitus (HCC)   GERD (gastroesophageal reflux disease)   Gastroparesis due to DM (HCC)   S/P BKA (below knee amputation) unilateral, left (HCC)   Major depressive disorder, recurrent episode, mild (HCC)   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, unknown embolic source   Essential hypertension   Hyperlipidemia   PVD (peripheral vascular disease) (HCC)   Acute CVA > Presenting with left-sided deficits including left sided weakness and left facial droop as well as dysarthria. > History of prior embolic stroke in 2019 with residual left upper field visual deficits. > Has been not been taking aspirin, Plavix, atorvastatin recently due to it making his stomach feel bad > Workup in the ED thus far has confirmed acute right MCA territory infarcts on MRI.  CTA head and neck with right M2 occlusion, right vertebral artery occlusion at origin, intracranial atherosclerosis with right V4 and left P3 stenosis. > Echocardiogram has already been performed and is concerning for possible small apical thrombus of the left ventricle.  Will need TEE to confirm. > Neurology consulted and are following. - Appreciate neurology recommendations and assistance - Will consult cardiology for TEE to confirm thrombus - Allow for permissive HTN (systolic < 220 and diastolic < 120)  - If thrombus is confirmed we will need to start heparin - Daily aspirin and Plavix  - Continue home statin - A1C  - Lipid panel  - Tele monitoring  - SLP eval - PT/OT  Hypertension - Permissive hypertension as above  Hyperlipidemia - Continue home rosuvastatin  GERD - Continue home PPI  Diabetes Neuropathy Gastroparesis > 50U qAM 40U qPM at home  with SSI - 30 units twice daily long-acting insulin -SSI - Continue home Reglan  PAD - Continue aspirin, Plavix, rosuvastatin  Chronic systolic CHF > Echo this morning showed EF 45.50%, indeterminate diastolic function, normal RV function.  And above-mentioned suspected small LV apical thrombus. - Plan is for TEE to confirm thrombus as above. - Holding antihypertensives including Lasix for now  DVT prophylaxis: Lovenox for now, will be starting heparin if LV thrombus. Code Status:   DNR/DNI Family Communication:  Updated at bedside  Disposition Plan:   Patient is from:  Home  Anticipated DC to:  Pending clinical course  Anticipated DC date:  2 to 5 days  Anticipated DC barriers: None  Consults called:  Neurology, cardiology for TEE Admission status:  Observation, telemetry  Severity of Illness: The appropriate  patient status for this patient is OBSERVATION. Observation status is judged to be reasonable and necessary in order to provide the required intensity of service to ensure the patient's safety. The patient's presenting symptoms, physical exam findings, and initial radiographic and laboratory data in the context of their medical condition is felt to place them at decreased risk for further clinical deterioration. Furthermore, it is anticipated that the patient will be medically stable for discharge from the hospital within 2 midnights of admission.    Synetta Fail MD [facility]  How to contact the [facility] Attending or Consulting provider 7A - 7P or covering provider during after hours 7P -7A, for this patient?   Check the care team in [facility] and look for a) attending/consulting [facility] provider listed and b) the [facility] team listed Log into [email or web address] and use [place]'s universal password to access. If you do not have the password, please contact the hospital operator. Locate the [facility] provider you are looking for under [facility] and page to a number that you can  be directly reached. If you still have difficulty reaching the provider, please page the [facility] (Director on Call) for the Hospitalists listed on amion for assistance.  01/02/2024, 12:40 PM

## 2024-01-02 NOTE — ED Provider Notes (Signed)
[place] EMERGENCY DEPARTMENT AT [facility] Provider Note   CSN: [id number] Arrival date & time: 01/02/24  4696  An emergency department physician performed an initial assessment on this suspected stroke patient at 66.  History  Chief Complaint  Patient presents with   Dysarthria   Weakness    Travis [name] is a 53 y.o. male.  HPI 53 year old male history of heart failure, prior stroke, COPD, diabetes presenting for code stroke.  Patient went to bed around 1230 last night at baseline.  Woke up around 5 AM with dysarthria, left-sided weakness, left-sided facial droop.  On arrival here he has dysarthria, left-sided facial droop, left arm drift.  Denies any headache, vision changes.  No chest pain or shortness of breath.  No recent fevers or chills or other illness.  Is not had any trauma.     Home Medications Prior to Admission medications   Medication Sig Start Date End Date Taking? Authorizing Provider  aspirin EC 325 MG EC tablet Take 1 tablet (325 mg total) by mouth daily. Patient taking differently: Take 325 mg by mouth at bedtime. 12/04/17  Yes Barrett, Erin R, PA-C  clopidogrel (PLAVIX) 75 MG tablet TAKE 1 TABLET(75 MG) BY MOUTH DAILY 07/25/20  Yes Waldon Merl, PA-C  furosemide (LASIX) 20 MG tablet TAKE 1 TABLET(20 MG) BY MOUTH DAILY 10/29/19  Yes Chilton Si, MD  insulin glargine (LANTUS SOLOSTAR) 100 UNIT/ML Solostar Pen Inject 80 Units into the skin every morning. And pen needles 1/day 06/06/23  Yes Motwani, Komal, MD  insulin lispro (HUMALOG) 100 UNIT/ML KwikPen Inject 0-12 Units into the skin 3 (three) times daily. Inject 0-12 units per sliding scale under the skin with meals. 11/03/23  Yes [provider]  metoCLOPramide (REGLAN) 10 MG tablet Take 1 tablet (10 mg total) by mouth every 8 (eight) hours as needed for nausea. Patient taking differently: Take 10 mg by mouth daily. 09/17/20  Yes Waldon Merl, PA-C  metoprolol succinate  (TOPROL-XL) 25 MG 24 hr tablet TAKE 1/2 TABLET BY MOUTH EVERY DAY 07/28/20  Yes Chilton Si, MD  omeprazole (PRILOSEC) 40 MG capsule Take 40 mg by mouth daily.   Yes [provider]  ondansetron (ZOFRAN ODT) 4 MG disintegrating tablet Take 1 tablet (4 mg total) by mouth every 8 (eight) hours as needed for nausea or vomiting. 09/27/20  Yes Lorelee New, PA-C  ramipril (ALTACE) 2.5 MG capsule TAKE 1 CAPSULE(2.5 MG) BY MOUTH DAILY 10/26/21  Yes Worthy Rancher B, FNP  rosuvastatin (CRESTOR) 40 MG tablet TAKE 1 TABLET(40 MG) BY MOUTH DAILY 11/10/20  Yes Chilton Si, MD  Blood Glucose Monitoring Suppl (ONETOUCH VERIO) w/Device KIT 1 application by Does not apply route 4 (four) times daily. 09/13/18   Durward Parcel, DO  Continuous Blood Gluc Sensor (FREESTYLE LIBRE 2 SENSOR) MISC 1 Device by Does not apply route every 14 (fourteen) days. 04/06/22   Romero Belling, MD  glucose blood [facility] VERIO) test strip 1 each by Other route as needed for other. Use as instructed 09/18/18   Durward Parcel, DO  ibuprofen (ADVIL) 400 MG tablet Take 1 tablet (400 mg total) by mouth 4 (four) times daily. Patient taking differently: Take 400 mg by mouth every 6 (six) hours as needed for moderate pain (pain score 4-6). 09/27/20   Lorelee New, PA-C  Insulin Pen Needle 31G X 5 MM MISC Use as directed to inject insulin. Dx Code:e11.43 12/04/19   Nestor Ramp, MD  [facility]  LANCETS 33G MISC 1 application by Does not apply route 4 (four) times daily. 09/18/18   Durward Parcel, DO      Allergies    Patient has no known allergies.    Review of Systems   Review of Systems Review of systems completed and notable as per HPI.  ROS otherwise negative.   Physical Exam Updated Vital Signs BP (!) 131/57   Pulse 72   Temp 98.2 F (36.8 C)   Resp 16   Ht 6\' 2"  (1.88 m)   Wt 125 kg   SpO2 97%   BMI 35.38 kg/m  Physical Exam Vitals and nursing note reviewed.  Constitutional:      General:  He is not in acute distress.    Appearance: He is well-developed.  HENT:     Head: Normocephalic and atraumatic.  Eyes:     Extraocular Movements: Extraocular movements intact.     Conjunctiva/sclera: Conjunctivae normal.     Pupils: Pupils are equal, round, and reactive to light.  Cardiovascular:     Rate and Rhythm: Normal rate and regular rhythm.     Heart sounds: No murmur heard. Pulmonary:     Effort: Pulmonary effort is normal. No respiratory distress.     Breath sounds: Normal breath sounds.  Abdominal:     Palpations: Abdomen is soft.     Tenderness: There is no abdominal tenderness.  Musculoskeletal:        General: No swelling.     Cervical back: Neck supple.  Skin:    General: Skin is warm and dry.     Capillary Refill: Capillary refill takes less than 2 seconds.  Neurological:     Mental Status: He is alert and oriented to person, place, and time.     Comments: Awake and alert.  Appropriately oriented.  Mild dysarthria.  He has left-sided drift in his upper extremity.  No drift in the lower extremity.  Full-strength in the right upper extremity.  Normal sensation throughout.  Left-sided facial droop.  No visual field deficits.  No neglect.  Psychiatric:        Mood and Affect: Mood normal.     ED Results / Procedures / Treatments   Labs (all labs ordered are listed, but only abnormal results are displayed) Labs Reviewed  CBC - Abnormal; Notable for the following components:      Result Value   WBC 11.1 (*)    All other components within normal limits  DIFFERENTIAL - Abnormal; Notable for the following components:   Neutro Abs 8.3 (*)    All other components within normal limits  COMPREHENSIVE METABOLIC PANEL - Abnormal; Notable for the following components:   Sodium 131 (*)    Glucose, Bld 227 (*)    Calcium 8.8 (*)    Total Bilirubin 1.6 (*)    All other components within normal limits  HEMOGLOBIN A1C - Abnormal; Notable for the following components:    Hgb A1c MFr Bld 7.7 (*)    All other components within normal limits  CBG MONITORING, ED - Abnormal; Notable for the following components:   Glucose-Capillary 229 (*)    All other components within normal limits  I-STAT CHEM 8, ED - Abnormal; Notable for the following components:   Glucose, Bld 233 (*)    Calcium, Ion 1.07 (*)    All other components within normal limits  PROTIME-INR  APTT  ETHANOL  HIV ANTIBODY (ROUTINE TESTING W REFLEX)  CBG MONITORING, ED  EKG EKG Interpretation Date/Time:  Monday January 02 2024 08:22:09 EST Ventricular Rate:  81 PR Interval:  169 QRS Duration:  110 QT Interval:  399 QTC Calculation: 464 R Axis:   107  Text Interpretation: Right and left arm electrode reversal, interpretation assumes no reversal Sinus rhythm Right axis deviation Consider anterior infarct Abnormal T, consider ischemia, lateral leads Confirmed by Fulton Reek [phone]) on 01/02/2024 8:32:24 AM  Radiology ECHOCARDIOGRAM COMPLETE Result Date: 01/02/2024    ECHOCARDIOGRAM REPORT   Patient Name:   Travis [name] Date of Exam: 01/02/2024 Medical Rec #:  [id number]        Height:       74.0 in Accession #:    [id number]       Weight:       275.6 lb Date of Birth:  1971/06/14         BSA:          2.491 m Patient Age:    52 years         BP:           163/70 mmHg Patient Gender: M                HR:           80 bpm. Exam Location:  Inpatient Procedure: 2D Echo, Cardiac Doppler, Color Doppler and Intracardiac            Opacification Agent Indications:    Stroke  History:        Patient has prior history of Echocardiogram examinations, most                 recent 06/25/2018. CAD; Risk Factors:Diabetes and Hypertension.  Sonographer:    Vern Claude Referring Phys: [id number] DEVON SHAFER  Sonographer Comments: Technically difficult study due to poor echo windows and suboptimal subcostal window. IMPRESSIONS  1. Possible small LV apical thrombus (clip 78-79). Image quality is suboptimal. In  setting of stroke, consider cardiac MRI to further assess.  2. Left ventricular ejection fraction, by estimation, is 45 to 50%. The left ventricle has mildly decreased function. Left ventricular endocardial border not optimally defined to evaluate regional wall motion. There is mild left ventricular hypertrophy.  Indeterminate diastolic filling due to E-A fusion. There is abnormal septal motion.  3. Right ventricular systolic function is normal. The right ventricular size is normal. Tricuspid regurgitation signal is inadequate for assessing PA pressure.  4. The mitral valve is grossly normal. Trivial mitral valve regurgitation.  5. The aortic valve was not well visualized. Aortic valve regurgitation is not visualized. No aortic stenosis is present.  6. The inferior vena cava is normal in size with <50% respiratory variability, suggesting right atrial pressure of 8 mmHg. FINDINGS  Left Ventricle: Left ventricular ejection fraction, by estimation, is 45 to 50%. The left ventricle has mildly decreased function. Left ventricular endocardial border not optimally defined to evaluate regional wall motion. The left ventricular internal cavity size was normal in size. There is mild left ventricular hypertrophy. Abnormal septal motion. Indeterminate diastolic filling due to E-A fusion. Right Ventricle: The right ventricular size is normal. Right vetricular wall thickness was not well visualized. Right ventricular systolic function is normal. Tricuspid regurgitation signal is inadequate for assessing PA pressure. Left Atrium: Left atrial size was normal in size. Right Atrium: Right atrial size was normal in size. Pericardium: There is no evidence of pericardial effusion. Mitral Valve: The mitral valve is grossly normal. Trivial mitral valve regurgitation. MV  peak gradient, 2.8 mmHg. The mean mitral valve gradient is 2.0 mmHg. Tricuspid Valve: The tricuspid valve is not well visualized. Tricuspid valve regurgitation is trivial.  Aortic Valve: The aortic valve was not well visualized. Aortic valve regurgitation is not visualized. No aortic stenosis is present. Aortic valve mean gradient measures 3.0 mmHg. Aortic valve peak gradient measures 4.2 mmHg. Aortic valve area, by VTI measures 3.08 cm. Pulmonic Valve: The pulmonic valve was not well visualized. Pulmonic valve regurgitation is trivial. Aorta: The aortic root is normal in size and structure. Venous: The inferior vena cava was not well visualized. The inferior vena cava is normal in size with less than 50% respiratory variability, suggesting right atrial pressure of 8 mmHg. IAS/Shunts: The interatrial septum was not well visualized.  LEFT VENTRICLE PLAX 2D LVIDd:         5.10 cm      Diastology LVIDs:         3.90 cm      LV e' medial:    6.64 cm/s LV PW:         1.10 cm      LV E/e' medial:  13.2 LV IVS:        0.80 cm      LV e' lateral:   8.70 cm/s LVOT diam:     2.20 cm      LV E/e' lateral: 10.1 LV SV:         75 LV SV Index:   30 LVOT Area:     3.80 cm  LV Volumes (MOD) LV vol d, MOD A4C: 199.0 ml LV vol s, MOD A4C: 109.0 ml LV SV MOD A4C:     199.0 ml RIGHT VENTRICLE            IVC RV Basal diam:  3.60 cm    IVC diam: 1.50 cm RV S prime:     6.74 cm/s TAPSE (M-mode): 1.7 cm LEFT ATRIUM             Index        RIGHT ATRIUM           Index LA diam:        4.00 cm 1.61 cm/m   RA Area:     16.50 cm LA Vol (A2C):   74.3 ml 29.83 ml/m  RA Volume:   41.40 ml  16.62 ml/m LA Vol (A4C):   73.1 ml 29.35 ml/m LA Biplane Vol: 75.7 ml 30.39 ml/m  AORTIC VALVE                    PULMONIC VALVE AV Area (Vmax):    3.30 cm     PV Vmax:       0.86 m/s AV Area (Vmean):   2.96 cm     PV Peak grad:  3.0 mmHg AV Area (VTI):     3.08 cm AV Vmax:           103.00 cm/s AV Vmean:          82.200 cm/s AV VTI:            0.242 m AV Peak Grad:      4.2 mmHg AV Mean Grad:      3.0 mmHg LVOT Vmax:         89.40 cm/s LVOT Vmean:        64.100 cm/s LVOT VTI:          0.196 m LVOT/AV VTI ratio: 0.81  AORTA Ao Root diam: 3.10 cm Ao Asc diam:  3.00 cm MITRAL VALVE MV Area (PHT): 4.60 cm    SHUNTS MV Area VTI:   4.46 cm    Systemic VTI:  0.20 m MV Peak grad:  2.8 mmHg    Systemic Diam: 2.20 cm MV Mean grad:  2.0 mmHg MV Vmax:       0.83 m/s MV Vmean:      65.1 cm/s MV Decel Time: 165 msec MV E velocity: 87.50 cm/s MV A velocity: 78.60 cm/s MV E/A ratio:  1.11 Weston Brass MD Electronically signed by Weston Brass MD Signature Date/Time: 01/02/2024/11:57:47 AM    Final    MR BRAIN WO CONTRAST Result Date: 01/02/2024 CLINICAL DATA:  Stroke, follow up EXAM: MRI HEAD WITHOUT CONTRAST TECHNIQUE: Multiplanar, multiecho pulse sequences of the brain and surrounding structures were obtained without intravenous contrast. COMPARISON:  Head CT 01/02/2024 FINDINGS: Brain: Acute infarcts in the right MCA territory involving the body of the caudate on the right, right insular cortex, right frontal lobe. No hemorrhage. No hydrocephalus. No extra-axial fluid collection. Chronic left thalamic and right occipital no mass effect. No mass lesion. Vascular: Normal flow voids. Skull and upper cervical spine: Normal marrow signal. Sinuses/Orbits: Trace left mastoid effusion. No middle ear effusion. Paranasal sinuses are clear. Left lens replacement. Orbits are otherwise unremarkable. Other: None. IMPRESSION: Acute infarcts in the right MCA territory involving the body of the caudate on the right, right insular cortex, and right frontal lobe. No hemorrhage or mass effect. Electronically Signed   By: Lorenza Cambridge M.D.   On: 01/02/2024 10:16   CT ANGIO HEAD NECK W WO CM (CODE STROKE) Result Date: 01/02/2024 CLINICAL DATA:  Neuro deficit, acute, stroke suspected. Slurred speech. EXAM: CT ANGIOGRAPHY HEAD AND NECK WITH AND WITHOUT CONTRAST TECHNIQUE: Multidetector CT imaging of the head and neck was performed using the standard protocol during bolus administration of intravenous contrast. Multiplanar CT image reconstructions and  MIPs were obtained to evaluate the vascular anatomy. Carotid stenosis measurements (when applicable) are obtained utilizing NASCET criteria, using the distal internal carotid diameter as the denominator. RADIATION DOSE REDUCTION: This exam was performed according to the departmental dose-optimization program which includes automated exposure control, adjustment of the mA and/or kV according to patient size and/or use of iterative reconstruction technique. CONTRAST:  75mL OMNIPAQUE IOHEXOL 350 MG/ML SOLN COMPARISON:  CTA head and neck 06/25/2018 FINDINGS: CTA NECK FINDINGS Aortic arch: Normal variant aortic arch branching pattern with common origin of the brachiocephalic and left common carotid arteries. Widely patent arch vessel origins. Right carotid system: Patent without evidence of stenosis or dissection. Left carotid system: Patent without evidence of stenosis or dissection. Vertebral arteries: New short segment occlusion of the right vertebral artery at its origin over an approximately 1 cm long segment with the vessel being patent throughout the remainder of the neck. Patent left vertebral artery with suboptimal assessment of the proximal V1 segment but no evidence of a significant stenosis more distally. Skeleton: Mild cervical spondylosis. Other neck: No evidence of cervical lymphadenopathy or mass. Upper chest: Clear lung apices. Review of the MIP images confirms the above findings CTA HEAD FINDINGS Anterior circulation: The internal carotid arteries are patent from skull base to carotid termini with mild atherosclerosis not resulting in a significant stenosis. ACAs and MCAs are patent proximally without evidence of a significant A1 or M1 stenosis. There is occlusion of a mid right M2 branch vessel with distal reconstitution. No aneurysm is identified. Posterior  circulation: The intracranial vertebral arteries are patent to the basilar with moderate multifocal narrowing of the right V4 segment. Patent PICA  and SCA origins are visualized bilaterally. The basilar artery is widely patent. There are small right and large left posterior communicating arteries. The PCAs are patent with branch vessel irregularity, including a severe proximal left P3 stenosis with diminished opacification distally. No aneurysm is identified. Venous sinuses: Patent. Anatomic variants: None. Review of the MIP images confirms the above findings These results were communicated to Dr. Iver Nestle at 8:42 am on 01/02/2024 by text page via the [facility] messaging system. IMPRESSION: 1. Occlusion of a mid right M2 branch vessel with distal reconstitution. 2. Short segment occlusion of the right vertebral artery at its origin. 3. Intracranial atherosclerosis including moderate right V4 and severe left P3 stenoses. Electronically Signed   By: Sebastian Ache M.D.   On: 01/02/2024 08:53   CT HEAD CODE STROKE WO CONTRAST Result Date: 01/02/2024 CLINICAL DATA:  Code stroke.  Slurred speech EXAM: CT HEAD WITHOUT CONTRAST TECHNIQUE: Contiguous axial images were obtained from the base of the skull through the vertex without intravenous contrast. RADIATION DOSE REDUCTION: This exam was performed according to the departmental dose-optimization program which includes automated exposure control, adjustment of the mA and/or kV according to patient size and/or use of iterative reconstruction technique. COMPARISON:  06/26/2018 brain MRI FINDINGS: Brain: No evidence of acute infarction, hemorrhage, hydrocephalus, extra-axial collection or mass lesion/mass effect. 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 Vascular: No hyperdense vessel. Premature atheromatous calcification. Skull: Normal. Negative for fracture or focal lesion. Sinuses/Orbits: No acute finding Other: Prelim sent to neurology in epic CT. ASPECTS ([place] Stroke Program Early CT Score) - Ganglionic level infarction (caudate, lentiform nuclei,  internal capsule, insula, M1-M3 cortex): 7 - Supraganglionic infarction (M4-M6 cortex): 3 Total score (0-10 with 10 being normal): 10 IMPRESSION: Chronic infarcts in the known from 2019. No acute or interval finding. Electronically Signed   By: Tiburcio Pea M.D.   On: 01/02/2024 07:55    Procedures Procedures    Medications Ordered in ED Medications   stroke: early stages of recovery book (has no administration in time range)  aspirin EC tablet 81 mg (81 mg Oral Given 01/02/24 0900)  perflutren lipid microspheres (DEFINITY) IV suspension (3 mLs Intravenous Given 01/02/24 0916)  rosuvastatin (CRESTOR) tablet 40 mg (has no administration in time range)  metoCLOPramide (REGLAN) tablet 10 mg (has no administration in time range)  pantoprazole (PROTONIX) EC tablet 40 mg (has no administration in time range)  clopidogrel (PLAVIX) tablet 75 mg (has no administration in time range)  acetaminophen (TYLENOL) tablet 650 mg (has no administration in time range)    Or  acetaminophen (TYLENOL) 160 MG/5ML solution 650 mg (has no administration in time range)    Or  acetaminophen (TYLENOL) suppository 650 mg (has no administration in time range)  senna-docusate (Senokot-S) tablet 1 tablet (has no administration in time range)  enoxaparin (LOVENOX) injection 40 mg (has no administration in time range)  sodium chloride flush (NS) 0.9 % injection 3 mL (3 mLs Intravenous Given 01/02/24 0821)  iohexol (OMNIPAQUE) 350 MG/ML injection 75 mL (75 mLs Intravenous Contrast Given 01/02/24 0820)  clopidogrel (PLAVIX) tablet 300 mg (300 mg Oral Given 01/02/24 0900)    ED Course/ Medical Decision Making/ A&P  Medical Decision Making Amount and/or Complexity of Data Reviewed Labs: ordered. Radiology: ordered.  Risk Decision regarding hospitalization.   Medical Decision Making:   Travis [name] is a 53 y.o. male who presented to the ED today with concern for stroke.  Last  known well was last night, and woke up this morning with facial droop, dysarthria, left arm weakness.  History of prior stroke as well.  Patient evaluated neurology at bedside on arrival.  CT head is unremarkable CTA does show M2 occlusion.  Neurology recommends MRI, no plan for thrombectomy at this time.   Patient placed on continuous vitals and telemetry monitoring while in ED which was reviewed periodically.  Reviewed and confirmed nursing documentation for past medical history, family history, social history.  Reassessment and Plan:   MRI with acute infarct.  Discussed with neurology, they recommended medicine admission for acute 2-hour neurochecks.  Discussed with hospitalist and admitted.  Patient's presentation is most consistent with acute complicated illness / injury requiring diagnostic workup.           Final Clinical Impression(s) / ED Diagnoses Final diagnoses:  Cerebrovascular accident (CVA), unspecified mechanism (HCC)    Rx / DC Orders ED Discharge Orders     None         Laurence Spates, MD 01/02/24 1257

## 2024-01-02 NOTE — Consult Note (Addendum)
NEUROLOGY CONSULT NOTE   Date of service: January 02, 2024 Patient Name: Travis [name] MRN:  [id number] DOB:  [birth year] Chief Complaint: "slurred speech" Requesting Provider: Laurence Spates, MD  History of Present Illness  Travis [name] is a 53 y.o. male  has a past medical history of Acute on chronic systolic and diastolic heart failure, NYHA class 3 (HCC) (09/14/2017), Acute osteomyelitis of metatarsal bone of left foot (HCC) (11/12/2015), CAD in native artery (09/14/2017), Closed fracture of right tibial plateau (11/11/2015), Coronary artery disease, Dehiscence of amputation stump (HCC), Depression with anxiety, Diabetes mellitus, Diabetic peripheral neuropathy associated with type 2 diabetes mellitus (HCC) (08/30/2008), Diabetic ulcer of left foot (HCC) (11/14/2015), Gastroparesis, GERD (gastroesophageal reflux disease), History of loop recorder (06/2018), Hypertension, Left ventricular aneurysm, Myocardial infarction [facility]), Osteomyelitis (HCC), Osteoporosis (11/12/2015), Peripheral neuropathy, Peripheral vascular disease (HCC), Shortness of breath dyspnea, Stroke (HCC) (05/2018), and Vitamin D deficiency (11/12/2015).   Presenting with left upper extremity droop, facial droop and dysarthria. He went to bed at 0030 in his usual state of health. At 0515 he woke up with slurred speech. He texted his sister at 0600 asking "are you awake" and then texted her at 0700 "I think I am having a stroke". He did have a stroke in 2019 (scattered bilateral infarcts) with documented left upper quadrantanopia as a residual. He is currently supposed to be on ASA and Plavix. He did also have a loop recorder placed at this time. Last loop recorder report was in 09/14/2021 with no documented afib. TEE at that time EF 35-40% with no embolic source identified.  From home where he lives alone. Spoke with sister who states that he manages his finances and medications independently. He states he is only compliant with his  insulin, has not taken ASA or plavix in a year as he feels he generally feels better off of these medications. On arrival BP 198/101, glucose 229.   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; this has been previously noted but he is able to count fingers in all visual fields  LKW: 0030 Modified rankin score: 3-Moderate disability-requires help but walks WITHOUT assistance IV Thrombolysis: Outside of the window EVT: No , no LVO  NIHSS components Score: Comment  1a Level of Conscious 0[x]  1[]  2[]  3[]      1b LOC Questions 0[x]  1[]  2[]       1c LOC Commands 0[x]  1[]  2[]       2 Best Gaze 0[x]  1[]  2[]       3 Visual 0[x]  1[]  2[]  3[]      4 Facial Palsy 0[]  1[x]  2[]  3[]      5a Motor Arm - left 0[]  1[x]  2[]  3[]  4[]  UN[]    5b Motor Arm - Right 0[x]  1[]  2[]  3[]  4[]  UN[]    6a Motor Leg - Left 0[x]  1[]  2[]  3[]  4[]  UN[]    6b Motor Leg - Right 0[x]  1[]  2[]  3[]  4[]  UN[]    7 Limb Ataxia 0[x]  1[]  2[]  3[]  UN[]     8 Sensory 0[x]  1[]  2[]  UN[]      9 Best Language 0[x]  1[]  2[]  3[]      10 Dysarthria 0[]  1[x]  2[]  UN[]      11 Extinct. and Inattention 0[x]  1[]  2[]       TOTAL: 3      ROS  Comprehensive ROS performed and pertinent positives documented in HPI   Past History   Past Medical History:  Diagnosis  Date   Acute on chronic systolic and diastolic heart failure, NYHA class 3 (HCC) 09/14/2017   Acute osteomyelitis of metatarsal bone of left foot (HCC) 11/12/2015   CAD in native artery 09/14/2017   Closed fracture of right tibial plateau 11/11/2015   Coronary artery disease    Dehiscence of amputation stump (HCC)    left leg   Depression with anxiety    Diabetes mellitus    INSULIN DEPENDENT Type II   Diabetic peripheral neuropathy associated with type 2 diabetes mellitus (HCC) 08/30/2008   Qualifier: Diagnosis of  By: Daphine Deutscher FNP, [place]     Diabetic ulcer of left foot (HCC) 11/14/2015   Gastroparesis     GERD (gastroesophageal reflux disease)    History of loop recorder 06/2018   Last remote device check was on 02/14/2019   Hypertension    patient denies   Left ventricular aneurysm    REPORTS HE IS NOT AWAREW OF THIS    Myocardial infarction (HCC)    Osteomyelitis (HCC)    Osteoporosis 11/12/2015   Peripheral neuropathy    feet and below bilateral knees   Peripheral vascular disease (HCC)    PAD in Left leg and some in right leg   Shortness of breath dyspnea    Stroke (HCC) 05/2018   Vitamin D deficiency 11/12/2015    Past Surgical History:  Procedure Laterality Date   AMPUTATION Left 11/28/2015   Procedure: Left 4th Ray Amputation;  Surgeon: Nadara Mustard, MD;  Location: [facility] OR;  Service: Orthopedics;  Laterality: Left;   AMPUTATION Left 10/29/2016   Procedure: LEFT 5TH RAY AMPUTATION;  Surgeon: Nadara Mustard, MD;  Location: [facility] OR;  Service: Orthopedics;  Laterality: Left;   AMPUTATION Left 03/09/2019   Procedure: LEFT FOOT 5TH RAY AMPUTATION;  Surgeon: Nadara Mustard, MD;  Location: [facility] OR;  Service: Orthopedics;  Laterality: Left;   AMPUTATION Left 04/18/2019   Procedure: LEFT BELOW KNEE AMPUTATION;  Surgeon: Nadara Mustard, MD;  Location: [facility] OR;  Service: Orthopedics;  Laterality: Left;   ANTERIOR VITRECTOMY Left 04/12/2016   Procedure: ANTERIOR CHAMBER WASH OUT WITH GAS FLUID EXCHANGE;  Surgeon: Carmela Rima, MD;  Location: [facility] OR;  Service: Ophthalmology;  Laterality: Left;   CARDIAC CATHETERIZATION  10/2017   CATARACT EXTRACTION Left    CHOLECYSTECTOMY N/A 01/04/2019   Procedure: LAPAROSCOPIC CHOLECYSTECTOMY;  Surgeon: Gaynelle Adu, MD;  Location: [facility] ORS;  Service: General;  Laterality: N/A;   CORONARY ARTERY BYPASS GRAFT N/A 11/29/2017    LIMA-LAD, SVG-PDA, SVG-CFX  Procedure: CORONARY ARTERY BYPASS GRAFTING (CABG) times three using the left saphaneous vien. harvested endoscopicly and left internal mammary artery.;  Surgeon: Kerin Perna, MD;  Location: [facility] OR;  Service: Open  Heart Surgery;  Laterality: N/A;   EYE SURGERY     left eye surgery 04/2016   IR CHOLANGIOGRAM EXISTING TUBE  10/17/2018   IR CHOLANGIOGRAM EXISTING TUBE  10/30/2018   IR PERC CHOLECYSTOSTOMY  09/05/2018   KNEE SURGERY Left    LOOP RECORDER INSERTION N/A 06/28/2018   Procedure: LOOP RECORDER INSERTION;  Surgeon: Duke Salvia, MD;  Location: [facility] INVASIVE CV LAB;  Service: Cardiovascular;  Laterality: N/A;   RIGHT/LEFT HEART CATH AND CORONARY ANGIOGRAPHY N/A 10/19/2017   Procedure: RIGHT/LEFT HEART CATH AND CORONARY ANGIOGRAPHY;  Surgeon: Swaziland, [name], MD;  Location: [facility] INVASIVE CV LAB;  Service: Cardiovascular;  Laterality: N/A;   SHOULDER SURGERY Right    TEE WITHOUT CARDIOVERSION N/A 11/29/2017   Procedure: TRANSESOPHAGEAL  ECHOCARDIOGRAM (TEE);  Surgeon: Donata Clay, Theron Arista, MD;  Location: [facility] OR;  Service: Open Heart Surgery;  Laterality: N/A;   TEE WITHOUT CARDIOVERSION N/A 06/28/2018   Procedure: TRANSESOPHAGEAL ECHOCARDIOGRAM (TEE);  Surgeon: Jake Bathe, MD;  Location: [facility] ENDOSCOPY;  Service: Cardiovascular;  Laterality: N/A;   TOE AMPUTATION Left 11/28/15   4th toe    Family History: Family History  Problem Relation Age of Onset   Cancer Paternal Grandmother    COPD Mother    Heart failure Mother    Anxiety disorder Mother    Depression Mother    Fibromyalgia Mother    Esophageal cancer Father        Smoker, still does   Diabetes Sister    Fibromyalgia Sister    Diabetes Maternal Uncle    Diabetes Maternal Grandmother    GER disease Sister    Liver disease Paternal Grandfather    Hemachromatosis Paternal Grandfather    Diabetes Maternal Grandfather     Social History  reports that he has never smoked. He has never used smokeless tobacco. He reports that he does not drink alcohol and does not use drugs.  No Known Allergies  Medications   Current Facility-Administered Medications:    sodium chloride flush (NS) 0.9 % injection 3 mL, 3 mL, Intravenous, Once, Laurence Spates, MD  Current Outpatient Medications:    aspirin EC 325 MG EC tablet, Take 1 tablet (325 mg total) by mouth daily. (Patient taking differently: Take 325 mg by mouth at bedtime.), Disp: 30 tablet, Rfl: 0   Blood Glucose Monitoring Suppl (ONETOUCH VERIO) w/Device KIT, 1 application by Does not apply route 4 (four) times daily., Disp: 1 kit, Rfl: 0   clopidogrel (PLAVIX) 75 MG tablet, TAKE 1 TABLET(75 MG) BY MOUTH DAILY, Disp: 90 tablet, Rfl: 1   Continuous Blood Gluc Sensor (FREESTYLE LIBRE 2 SENSOR) MISC, 1 Device by Does not apply route every 14 (fourteen) days., Disp: 6 each, Rfl: 3   furosemide (LASIX) 20 MG tablet, TAKE 1 TABLET(20 MG) BY MOUTH DAILY, Disp: 90 tablet, Rfl: 0   glucose blood (ONETOUCH VERIO) test strip, 1 each by Other route as needed for other. Use as instructed, Disp: 100 each, Rfl: 12   ibuprofen (ADVIL) 400 MG tablet, Take 1 tablet (400 mg total) by mouth 4 (four) times daily., Disp: 30 tablet, Rfl: 0   insulin glargine (LANTUS SOLOSTAR) 100 UNIT/ML Solostar Pen, Inject 80 Units into the skin every morning. And pen needles 1/day, Disp: 30 mL, Rfl: 0   Insulin Pen Needle 31G X 5 MM MISC, Use as directed to inject insulin. Dx Code:e11.43, Disp: 30 each, Rfl: 12   metoCLOPramide (REGLAN) 10 MG tablet, Take 1 tablet (10 mg total) by mouth every 8 (eight) hours as needed for nausea., Disp: 30 tablet, Rfl: 0   metoprolol succinate (TOPROL-XL) 25 MG 24 hr tablet, TAKE 1/2 TABLET BY MOUTH EVERY DAY, Disp: 45 tablet, Rfl: 1   ondansetron (ZOFRAN ODT) 4 MG disintegrating tablet, Take 1 tablet (4 mg total) by mouth every 8 (eight) hours as needed for nausea or vomiting., Disp: 20 tablet, Rfl: 0   ONETOUCH DELICA LANCETS 33G MISC, 1 application by Does not apply route 4 (four) times daily., Disp: 100 each, Rfl: 12   pantoprazole (PROTONIX) 40 MG tablet, Take 1 tablet (40 mg total) by mouth daily., Disp: 30 tablet, Rfl: 3   predniSONE (DELTASONE) 50 MG tablet, Take once daily by  mouth for 5 days., Disp:  5 tablet, Rfl: 0   ramipril (ALTACE) 2.5 MG capsule, TAKE 1 CAPSULE(2.5 MG) BY MOUTH DAILY, Disp: 90 capsule, Rfl: 1   rosuvastatin (CRESTOR) 40 MG tablet, TAKE 1 TABLET(40 MG) BY MOUTH DAILY, Disp: 90 tablet, Rfl: 1   Vitamin D, Ergocalciferol, (DRISDOL) 1.25 MG (50000 UNIT) CAPS capsule, Take 1 capsule (50,000 Units total) by mouth every 7 (seven) days., Disp: 12 capsule, Rfl: 0  Vitals  There were no vitals filed for this visit.  There is no height or weight on file to calculate BMI.  Physical Exam   Constitutional: Appears well-developed and well-nourished.  Psych: Affect appropriate to situation.  Eyes: No scleral injection.  HENT: No OP obstruction.  Head: Normocephalic.  Cardiovascular: Normal rate and regular rhythm.  Respiratory: Effort normal, non-labored breathing.  GI: Soft.  No distension. There is no tenderness.  Skin: WDI.   Neurologic Examination   Neuro: Mental Status: Patient is awake, alert, oriented to person, place, month, year, and situation. Speech is mildly dysarthric  No signs of aphasia or neglect, slowed speech at times Cranial Nerves: II: Visual Fields are full to confrontation although at least once on double simultaneous stimuli he missed the left upper quadrant stimulus; longstanding vision issues complicate examination. Pupils are equal, round, and reactive to light.   III,IV, VI: EOMI without ptosis or diploplia.  Saccadic pursuits V: Facial sensation is symmetric to temperature VII: Facial movement is notable for left facial droop VIII: Hearing is intact to voice X: Palate elevates symmetrically XI: Shoulder shrug is symmetric. XII: Tongue protrudes midline without atrophy or fasciculations.  Motor: Tone is normal. Bulk is normal.  RUE 5/5 LUE 4/5 with drift RLE 5/5 LLE 4+/5 hip flexion with BKA Sensory: Sensation is symmetric to light touch and temperature in the arms and legs Cerebellar: FNF intact  bilaterally Gait: Not personally tested but noted to ambulate independently and at his baseline with EMS, confirmed by patient; prosthetic was removed to obtain weight as part of code stroke protocol so gait was not retested  Labs/Imaging/Neurodiagnostic studies   CBC: No results for input(s): "WBC", "NEUTROABS", "HGB", "HCT", "MCV", "PLT" in the last 168 hours. Basic Metabolic Panel:  Lab Results  Component Value Date   NA 132 (L) 09/27/2020   K 4.2 09/27/2020   CO2 21 (L) 09/27/2020   GLUCOSE 301 (H) 09/27/2020   BUN 21 (H) 09/27/2020   CREATININE 1.63 (H) 09/27/2020   CALCIUM 9.1 09/27/2020   GFRNONAA 49 (L) 09/27/2020   GFRAA >60 04/18/2019   Lipid Panel:  Lab Results  Component Value Date   LDLCALC 73 06/26/2018   HgbA1c:  Lab Results  Component Value Date   HGBA1C 11.6 (A) 02/11/2022   Urine Drug Screen:     Component Value Date/Time   LABOPIA NONE DETECTED 06/25/2018 0730   COCAINSCRNUR NONE DETECTED 06/25/2018 0730   LABBENZ NONE DETECTED 06/25/2018 0730   AMPHETMU NONE DETECTED 06/25/2018 0730   THCU POSITIVE (A) 06/25/2018 0730   LABBARB (A) 06/25/2018 0730    Result not available. Reagent lot number recalled by manufacturer.    Alcohol Level     Component Value Date/Time   ETH <10 06/25/2018 0404   INR  Lab Results  Component Value Date   INR 0.95 12/28/2018   APTT  Lab Results  Component Value Date   APTT 33 12/28/2018   AED levels: No results found for: "PHENYTOIN", "ZONISAMIDE", "LAMOTRIGINE", "LEVETIRACETA"  CT Head without contrast(Personally reviewed): Chronic infarcts in the known  from 2019. No acute or interval finding.  CT angio Head and Neck with contrast(Personally reviewed): Occlusion of a mid right M2 branch vessel with distal reconstitution. Short segment occlusion of the right vertebral artery at its origin. Intracranial atherosclerosis including moderate right V4 and severe left P3 stenoses.  MRI Brain(Personally  reviewed): Acute infarcts in the right MCA territory involving the body of the caudate on the right, right insular cortex, and right frontal lobe.  ASSESSMENT   Travis [name] is a 53 y.o. male  has a past medical history of Acute on chronic systolic and diastolic heart failure, NYHA class 3 (HCC) (09/14/2017), Acute osteomyelitis of metatarsal bone of left foot (HCC) (11/12/2015), CAD in native artery (09/14/2017), Closed fracture of right tibial plateau (11/11/2015), Coronary artery disease, Dehiscence of amputation stump (HCC), Depression with anxiety, Diabetes mellitus, Diabetic peripheral neuropathy associated with type 2 diabetes mellitus (HCC) (08/30/2008), Diabetic ulcer of left foot (HCC) (11/14/2015), Gastroparesis, GERD (gastroesophageal reflux disease), History of loop recorder (06/2018), Hypertension, Left ventricular aneurysm, Myocardial infarction [facility]), Osteomyelitis (HCC), Osteoporosis (11/12/2015), Peripheral neuropathy, Peripheral vascular disease (HCC), Shortness of breath dyspnea, Stroke (HCC) (05/2018), and Vitamin D deficiency (11/12/2015).   Presenting with dysarthria, left upper extremity drift, and facial droop. Admit to hospitalist service for a stroke work up. CTA does show an occlusion with reconstitution at R M2 --however his symptoms are mild and nondisabling on examination, confirmed with sister and patient, therefore risks of thrombectomy are felt to outweigh benefit at this time. Plan for q 1 hour neuro exams until 1200 to ensure clinical stability in the short-term and then q 2 hour neuro exams per standard protocol. Load with Plavix 300mg  and then DAPT with ASA 81mg  and Plavix 75mg  with duration to be determined by stroke follow up team.   RECOMMENDATIONS  - HgbA1c, fasting lipid panel - MRI of the brain without contrast - q1hr neuro checks in the ED for 4 hours (1200) then q2 hour neuro checks  - Echocardiogram - Load with Plavix 300mg   - Prophylactic  therapy-Antiplatelet med: Aspirin 81mg  and Plavix 75mg  daily, duration to be determined pending full workup - Risk factor modification, medication adherence going forward discussed with patient and sister - Telemetry monitoring - PT consult, OT consult, Speech consult - Stroke team to follow  ______________________________________________________________________   Patient seen and examined by NP/APP with MD. MD to update note as needed.   Elmer Picker, DNP, FNP-BC [facility] Pager: [phone]  Attending Neurologist's note:  I personally saw this patient, gathering history, performing a full neurologic examination, reviewing relevant labs, personally reviewing relevant imaging including head CT, CTA head and neck, and formulated the assessment and plan, adding the note above for completeness and clarity to accurately reflect my thoughts  Brooke Dare MD-PhD [facility] [phone] Available 7 AM to 7 PM, outside these hours please contact Neurologist on call listed on AMION   CRITICAL CARE Performed by: Gordy Councilman   Total critical care time: 45 minutes  Critical care time was exclusive of separately billable procedures and treating other patients.  Critical care was necessary to treat or prevent imminent or life-threatening deterioration, emergent evaluation for consideration of thrombolytic or thrombectomy  Critical care was time spent personally by me on the following activities: development of treatment plan with patient and/or surrogate as well as nursing, discussions with consultants, evaluation of patient's response to treatment, examination of patient, obtaining history from patient or surrogate, ordering and performing treatments and interventions, ordering and  review of laboratory studies, ordering and review of radiographic studies, pulse oximetry and re-evaluation of patient's condition.

## 2024-01-02 NOTE — ED Notes (Signed)
Patient to MRI with Penni Bombard, RN

## 2024-01-02 NOTE — ED Triage Notes (Signed)
Delayed entry due to direct patient care. Patient presents as a code stroke via EMS with dysarthria and generalized weakness noted at approximately 0530 today. Patient's LKW was 0030 today. Patient assessed by neurology at the bridge upon arrival and transported to CT2 with primary RN and stroke RN at bedside. Patient is AAOx4.

## 2024-01-02 NOTE — Progress Notes (Signed)
   [facility] has been requested to perform a transesophageal echocardiogram on Travis [name] for evaluation of stroke.    Absolute Contraindications: Esophageal Stricture  The patient has: No other conditions that may impact this procedure.  After careful review of history and examination, the risks and benefits of transesophageal echocardiogram have been explained including risks of esophageal damage, perforation (1:10,000 risk), bleeding, pharyngeal hematoma as well as other potential complications associated with conscious sedation including aspiration, arrhythmia, respiratory failure and death. Alternatives to treatment were discussed, questions were answered. Patient is willing to proceed.   Patient underwent an esophogram, barium swallow, barium tablet study on 12/02/2020 that showed: Smooth distal esophageal stricture, preventing the passage of a 13 mm barium tablet. This is likely peptic and may contribute to the patient's dysphagia. No mucosal ulceration identified. Mild esophageal dysmotility with increased tertiary contractions and mild distal esophageal spasm.   Patient currently denies any dysphagia but states that he never had his stricture dilated because he ended up getting COVID at the time and then never returned to have it treated.  After speaking with MD, patient will need repeat esophogram prior to undergoing TEE. Discussed with patient and family present that patient is going to need esophogram and potential treatment of the stricture prior to undergoing TEE. Patient is agreeable to this plan and is willing to proceed with a TEE when safe to do so.  Signed, Olena Leatherwood, PA-C  01/02/2024 3:38 PM

## 2024-01-02 NOTE — Code Documentation (Signed)
Stroke Response Nurse Documentation Code Documentation  Travis [name] is a 53 y.o. male arriving to [facility]  via [facility] on 1/20 with past medical hx of CVA, DM, hld, htn. On No antithrombotic. Code stroke was activated by EMS.   Patient from home where he was LKW at 0030 today and now complaining of dysarthria and generalized weakness, with which he woke up this morning, after going to bed normal at 0030.   Stroke team at the bedside on patient arrival. Labs drawn and patient cleared for CT by Dr. Earlene Plater. Patient to CT with team. NIHSS 3, see documentation for details and code stroke times. Patient with left facial droop, left arm weakness, and dysarthria  on exam. The following imaging was completed:  CT Head and CTA. Patient is not a candidate for IV Thrombolytic due to being out of the treatment window. Patient could be a candidate for IR if worsening symptoms occur due to M2 occlusion seen on CTA.   Care Plan: q1 NIHSS and vitals until 1200 per neurology and then q2.   Bedside handoff with ED RN Madelynn.    Pearlie Oyster  Stroke Response RN

## 2024-01-02 NOTE — Progress Notes (Signed)
ECHO Report notable for: 1. Possible small LV apical thrombus (clip [id number]). Image quality is  suboptimal. In setting of stroke, consider cardiac MRI to further assess.   Appreciate primary team reaching out to cardiology for further evaluation  Would recommend TEE to confirm -- given this is not a definite LV thrombus will hold off on anticoag at this time especially given patient was loaded with Plavix already (which would increase bleeding risk) and started on Aspirin.  However if LV thrombus is confirmed would recommend pharmacy consult for low-goal no bolus neuro heparin protocol -- risk of ICH would be outweighed by benefit of preventing further stroke if LV thrombus is confirmed Indication  Bolus (units/kg)  Infusion rate (units/kg/hr)  Goal heparin level  Goal aPTT   CVA/TIA  None  12-14  0.3-0.5  66-85     Brooke Dare MD-PhD [facility] [phone] Discussed with Dr. Alinda Money via secure chat

## 2024-01-02 NOTE — ED Notes (Addendum)
Per Elmer Picker, NP, patient to have q1 hour neuro checks until 1200 and then q2 hour neuro checks.

## 2024-01-02 NOTE — Progress Notes (Signed)
SLP Cancellation Note  Patient Details Name: Travis [name] MRN: [id number] DOB: 09-Nov-1971   Cancelled treatment:       Reason Eval/Treat Not Completed: Discussed with MD, who reports no concerns with pt's swallowing. Pt passed the [place] swallow screen and is having no difficulty on a regular diet. SLP will f/u as time permits to complete a cognitive-linguistic evaluation.    Gwynneth Aliment, M.A., CF-SLP Speech Language Pathology, Acute Rehabilitation Services  Secure Chat preferred [phone]  01/02/2024, 4:41 PM

## 2024-01-02 NOTE — Progress Notes (Signed)
  Echocardiogram 2D Echocardiogram has been performed.  Ocie Doyne RDCS 01/02/2024, 9:16 AM

## 2024-01-03 ENCOUNTER — Inpatient Hospital Stay (HOSPITAL_BASED_OUTPATIENT_CLINIC_OR_DEPARTMENT_OTHER): Payer: 59

## 2024-01-03 ENCOUNTER — Encounter (HOSPITAL_COMMUNITY)
Admission: EM | Disposition: A | Discharge status: Home / Self Care | Payer: Self-pay | Source: Home / Self Care | Attending: Emergency Medicine

## 2024-01-03 ENCOUNTER — Observation Stay (HOSPITAL_COMMUNITY): Payer: 59

## 2024-01-03 DIAGNOSIS — I255 Ischemic cardiomyopathy: Secondary | ICD-10-CM

## 2024-01-03 DIAGNOSIS — E11319 Type 2 diabetes mellitus with unspecified diabetic retinopathy without macular edema: Secondary | ICD-10-CM | POA: Diagnosis present

## 2024-01-03 DIAGNOSIS — G8194 Hemiplegia, unspecified affecting left nondominant side: Secondary | ICD-10-CM | POA: Diagnosis present

## 2024-01-03 DIAGNOSIS — E1165 Type 2 diabetes mellitus with hyperglycemia: Secondary | ICD-10-CM | POA: Diagnosis present

## 2024-01-03 DIAGNOSIS — Z66 Do not resuscitate: Secondary | ICD-10-CM | POA: Diagnosis present

## 2024-01-03 DIAGNOSIS — I5042 Chronic combined systolic (congestive) and diastolic (congestive) heart failure: Secondary | ICD-10-CM | POA: Diagnosis present

## 2024-01-03 DIAGNOSIS — H53462 Homonymous bilateral field defects, left side: Secondary | ICD-10-CM | POA: Diagnosis present

## 2024-01-03 DIAGNOSIS — R29810 Facial weakness: Secondary | ICD-10-CM | POA: Diagnosis present

## 2024-01-03 DIAGNOSIS — Z89512 Acquired absence of left leg below knee: Secondary | ICD-10-CM | POA: Diagnosis not present

## 2024-01-03 DIAGNOSIS — E669 Obesity, unspecified: Secondary | ICD-10-CM | POA: Diagnosis present

## 2024-01-03 DIAGNOSIS — E1151 Type 2 diabetes mellitus with diabetic peripheral angiopathy without gangrene: Secondary | ICD-10-CM | POA: Diagnosis present

## 2024-01-03 DIAGNOSIS — K219 Gastro-esophageal reflux disease without esophagitis: Secondary | ICD-10-CM | POA: Diagnosis present

## 2024-01-03 DIAGNOSIS — J449 Chronic obstructive pulmonary disease, unspecified: Secondary | ICD-10-CM | POA: Diagnosis present

## 2024-01-03 DIAGNOSIS — K222 Esophageal obstruction: Secondary | ICD-10-CM | POA: Diagnosis present

## 2024-01-03 DIAGNOSIS — E1143 Type 2 diabetes mellitus with diabetic autonomic (poly)neuropathy: Secondary | ICD-10-CM | POA: Diagnosis present

## 2024-01-03 DIAGNOSIS — I513 Intracardiac thrombosis, not elsewhere classified: Secondary | ICD-10-CM

## 2024-01-03 DIAGNOSIS — D72829 Elevated white blood cell count, unspecified: Secondary | ICD-10-CM | POA: Diagnosis present

## 2024-01-03 DIAGNOSIS — Z8616 Personal history of COVID-19: Secondary | ICD-10-CM | POA: Diagnosis not present

## 2024-01-03 DIAGNOSIS — Z951 Presence of aortocoronary bypass graft: Secondary | ICD-10-CM

## 2024-01-03 DIAGNOSIS — I639 Cerebral infarction, unspecified: Secondary | ICD-10-CM | POA: Diagnosis present

## 2024-01-03 DIAGNOSIS — Z794 Long term (current) use of insulin: Secondary | ICD-10-CM | POA: Diagnosis not present

## 2024-01-03 DIAGNOSIS — I63411 Cerebral infarction due to embolism of right middle cerebral artery: Secondary | ICD-10-CM | POA: Diagnosis present

## 2024-01-03 DIAGNOSIS — I110 Hypertensive heart disease with heart failure: Secondary | ICD-10-CM | POA: Diagnosis present

## 2024-01-03 DIAGNOSIS — E785 Hyperlipidemia, unspecified: Secondary | ICD-10-CM | POA: Diagnosis present

## 2024-01-03 DIAGNOSIS — I2510 Atherosclerotic heart disease of native coronary artery without angina pectoris: Secondary | ICD-10-CM | POA: Diagnosis present

## 2024-01-03 DIAGNOSIS — K3184 Gastroparesis: Secondary | ICD-10-CM | POA: Diagnosis present

## 2024-01-03 DIAGNOSIS — E1142 Type 2 diabetes mellitus with diabetic polyneuropathy: Secondary | ICD-10-CM | POA: Diagnosis present

## 2024-01-03 LAB — HIV ANTIBODY (ROUTINE TESTING W REFLEX): HIV Screen 4th Generation wRfx: NONREACTIVE

## 2024-01-03 LAB — COMPREHENSIVE METABOLIC PANEL
ALT: 11 U/L (ref 0–44)
AST: 16 U/L (ref 15–41)
Albumin: 3.7 g/dL (ref 3.5–5.0)
Alkaline Phosphatase: 63 U/L (ref 38–126)
Anion gap: 8 (ref 5–15)
BUN: 8 mg/dL (ref 6–20)
CO2: 26 mmol/L (ref 22–32)
Calcium: 8.8 mg/dL — ABNORMAL LOW (ref 8.9–10.3)
Chloride: 101 mmol/L (ref 98–111)
Creatinine, Ser: 1.13 mg/dL (ref 0.61–1.24)
GFR, Estimated: >60 mL/min (ref >60)
Glucose, Bld: 213 mg/dL — ABNORMAL HIGH (ref 70–99)
Potassium: 3.8 mmol/L (ref 3.5–5.1)
Sodium: 135 mmol/L (ref 135–145)
Total Bilirubin: 1.7 mg/dL — ABNORMAL HIGH (ref 0.0–1.2)
Total Protein: 7.1 g/dL (ref 6.5–8.1)

## 2024-01-03 LAB — CBC
HCT: 45 % (ref 39.0–52.0)
Hemoglobin: 15.2 g/dL (ref 13.0–17.0)
MCH: 29.9 pg (ref 26.0–34.0)
MCHC: 33.8 g/dL (ref 30.0–36.0)
MCV: 88.4 fL (ref 80.0–100.0)
Platelets: 328 10*3/uL (ref 150–400)
RBC: 5.09 MIL/uL (ref 4.22–5.81)
RDW: 13.2 % (ref 11.5–15.5)
WBC: 12.5 10*3/uL — ABNORMAL HIGH (ref 4.0–10.5)
nRBC: 0 % (ref 0.0–0.2)

## 2024-01-03 LAB — GLUCOSE, CAPILLARY
Glucose-Capillary: 164 mg/dL — ABNORMAL HIGH (ref 70–99)
Glucose-Capillary: 192 mg/dL — ABNORMAL HIGH (ref 70–99)
Glucose-Capillary: 202 mg/dL — ABNORMAL HIGH (ref 70–99)
Glucose-Capillary: 220 mg/dL — ABNORMAL HIGH (ref 70–99)
Glucose-Capillary: 224 mg/dL — ABNORMAL HIGH (ref 70–99)
Glucose-Capillary: 247 mg/dL — ABNORMAL HIGH (ref 70–99)
Glucose-Capillary: 254 mg/dL — ABNORMAL HIGH (ref 70–99)

## 2024-01-03 LAB — LIPID PANEL
Cholesterol: 162 mg/dL (ref 0–200)
HDL: 25 mg/dL — ABNORMAL LOW (ref >40)
LDL Cholesterol: 105 mg/dL — ABNORMAL HIGH (ref 0–99)
Total CHOL/HDL Ratio: 6.5 {ratio}
Triglycerides: 162 mg/dL — ABNORMAL HIGH (ref <150)
VLDL: 32 mg/dL (ref 0–40)

## 2024-01-03 LAB — HEMOGLOBIN A1C
Hgb A1c MFr Bld: 7.5 % — ABNORMAL HIGH (ref 4.8–5.6)
Mean Plasma Glucose: 168.55 mg/dL

## 2024-01-03 SURGERY — TRANSESOPHAGEAL ECHOCARDIOGRAM (TEE) (CATHLAB)
Anesthesia: Monitor Anesthesia Care

## 2024-01-03 MED ORDER — INSULIN ASPART 100 UNIT/ML IJ SOLN
0.0000 [IU] | Freq: Every day | INTRAMUSCULAR | Status: DC
  Administered 2024-01-03: 2 [IU] via SUBCUTANEOUS

## 2024-01-03 MED ORDER — METOCLOPRAMIDE HCL 10 MG PO TABS
10.0000 mg | ORAL_TABLET | Freq: Three times a day (TID) | ORAL | Status: DC
  Administered 2024-01-03 – 2024-01-04 (×2): 10 mg via ORAL
  Filled 2024-01-03 (×2): qty 1

## 2024-01-03 MED ORDER — METOPROLOL TARTRATE 50 MG PO TABS
100.0000 mg | ORAL_TABLET | Freq: Once | ORAL | Status: AC
  Administered 2024-01-03: 100 mg via ORAL
  Filled 2024-01-03: qty 2

## 2024-01-03 MED ORDER — INSULIN GLARGINE-YFGN 100 UNIT/ML ~~LOC~~ SOLN
20.0000 [IU] | Freq: Every day | SUBCUTANEOUS | Status: DC
  Administered 2024-01-03 – 2024-01-04 (×2): 20 [IU] via SUBCUTANEOUS
  Filled 2024-01-03 (×2): qty 0.2

## 2024-01-03 MED ORDER — APIXABAN 5 MG PO TABS
5.0000 mg | ORAL_TABLET | Freq: Two times a day (BID) | ORAL | Status: DC
  Administered 2024-01-03 – 2024-01-04 (×2): 5 mg via ORAL
  Filled 2024-01-03 (×2): qty 1

## 2024-01-03 MED ORDER — METOCLOPRAMIDE HCL 5 MG/ML IJ SOLN
5.0000 mg | Freq: Once | INTRAMUSCULAR | Status: AC
  Administered 2024-01-03: 5 mg via INTRAVENOUS
  Filled 2024-01-03: qty 2

## 2024-01-03 MED ORDER — ASPIRIN 81 MG PO TBEC
81.0000 mg | DELAYED_RELEASE_TABLET | Freq: Every day | ORAL | Status: DC
  Administered 2024-01-04: 81 mg via ORAL
  Filled 2024-01-03: qty 1

## 2024-01-03 MED ORDER — IOHEXOL 350 MG/ML SOLN
95.0000 mL | Freq: Once | INTRAVENOUS | Status: AC | PRN
  Administered 2024-01-03: 95 mL via INTRAVENOUS

## 2024-01-03 MED ORDER — CARVEDILOL 3.125 MG PO TABS
3.1250 mg | ORAL_TABLET | Freq: Two times a day (BID) | ORAL | Status: DC
Start: 2024-01-03 — End: 2024-01-04
  Administered 2024-01-03 – 2024-01-04 (×2): 3.125 mg via ORAL
  Filled 2024-01-03 (×2): qty 1

## 2024-01-03 MED ORDER — BISMUTH SUBSALICYLATE 262 MG/15ML PO SUSP
30.0000 mL | Freq: Once | ORAL | Status: AC
  Administered 2024-01-03: 30 mL via ORAL
  Filled 2024-01-03: qty 236

## 2024-01-03 MED ORDER — INSULIN ASPART 100 UNIT/ML IJ SOLN
0.0000 [IU] | Freq: Three times a day (TID) | INTRAMUSCULAR | Status: DC
  Administered 2024-01-03: 8 [IU] via SUBCUTANEOUS
  Administered 2024-01-04: 3 [IU] via SUBCUTANEOUS

## 2024-01-03 NOTE — Progress Notes (Signed)
Patient refused to do MRI informed Dr. Loney Loh.

## 2024-01-03 NOTE — Progress Notes (Signed)
STROKE TEAM PROGRESS NOTE   SUBJECTIVE (INTERVAL HISTORY) No family is at the bedside.  Overall his condition is gradually improving.  Patient stated that his speech much better, his left arm and extremity weakness much improved.  Cardiac CTA showed an LV apical thrombus and patent bypass grafts.  Cardiology Dr. Eden Emms on board, will start anticoagulation.   OBJECTIVE Temp:  [97.5 F (36.4 C)-98.7 F (37.1 C)] 97.6 F (36.4 C) (01/21 1626) Pulse Rate:  [72-82] 72 (01/21 1626) Cardiac Rhythm: Normal sinus rhythm (01/20 1900) Resp:  [16-18] 17 (01/21 1626) BP: (141-157)/(63-89) 152/89 (01/21 1626) SpO2:  [96 %-99 %] 96 % (01/21 1626)  Recent Labs  Lab 01/03/24 0010 01/03/24 0502 01/03/24 0809 01/03/24 1125 01/03/24 1628  GLUCAP 220* 192* 164* 202* 254*   Recent Labs  Lab 01/02/24 0738 01/02/24 0742 01/03/24 0433  NA 131* 135 135  K 4.4 4.4 3.8  CL 99 102 101  CO2 22  --  26  GLUCOSE 227* 233* 213*  BUN 13 16 8   CREATININE 0.90 0.90 1.13  CALCIUM 8.8*  --  8.8*   Recent Labs  Lab 01/02/24 0738 01/03/24 0433  AST 21 16  ALT 9 11  ALKPHOS 57 63  BILITOT 1.6* 1.7*  PROT 6.5 7.1  ALBUMIN 3.5 3.7   Recent Labs  Lab 01/02/24 0738 01/02/24 0742 01/03/24 0433  WBC 11.1*  --  12.5*  NEUTROABS 8.3*  --   --   HGB 14.6 15.0 15.2  HCT 43.1 44.0 45.0  MCV 89.0  --  88.4  PLT 264  --  328   No results for input(s): "CKTOTAL", "CKMB", "CKMBINDEX", "TROPONINI" in the last 168 hours. Recent Labs    01/02/24 0738  LABPROT 14.5  INR 1.1   No results for input(s): "COLORURINE", "LABSPEC", "PHURINE", "GLUCOSEU", "HGBUR", "BILIRUBINUR", "KETONESUR", "PROTEINUR", "UROBILINOGEN", "NITRITE", "LEUKOCYTESUR" in the last 72 hours.  Invalid input(s): "APPERANCEUR"     Component Value Date/Time   CHOL 162 01/03/2024 0433   CHOL 75 (L) 01/23/2018 0913   TRIG 162 (H) 01/03/2024 0433   HDL 25 (L) 01/03/2024 0433   HDL 32 (L) 01/23/2018 0913   CHOLHDL 6.5 01/03/2024 0433    VLDL 32 01/03/2024 0433   LDLCALC 105 (H) 01/03/2024 0433   LDLCALC 29 01/23/2018 0913   Lab Results  Component Value Date   HGBA1C 7.7 (H) 01/02/2024      Component Value Date/Time   LABOPIA NONE DETECTED 06/25/2018 0730   COCAINSCRNUR NONE DETECTED 06/25/2018 0730   LABBENZ NONE DETECTED 06/25/2018 0730   AMPHETMU NONE DETECTED 06/25/2018 0730   THCU POSITIVE (A) 06/25/2018 0730   LABBARB (A) 06/25/2018 0730    Result not available. Reagent lot number recalled by manufacturer.    Recent Labs  Lab 01/02/24 0738  ETH <10    I have personally reviewed the radiological images below and agree with the radiology interpretations.  CT CORONARY MORPH W/CTA COR W/SCORE W/CA W/CM &/OR WO/CM Result Date: 01/03/2024 CLINICAL DATA:  Evaluate for LV thrombus EXAM: Cardiac TAVR CT TECHNIQUE: The patient was scanned on a [employer]. A 120 kV retrospective scan was triggered in the descending thoracic aorta at 111 HU's. Gantry rotation speed was 250 msecs and collimation was .6 mm. 100mg  Lopressor was given. The 3D data set was reconstructed in 5% intervals of the R-R cycle. Systolic and diastolic phases were analyzed on a dedicated work station using MPR, MIP and VRT modes. The patient received 100 cc  of contrast. MEDICATIONS: MEDICATIONS 100mg  lopressor FINDINGS: Left Ventricle: Moderately dilated. LV apical thrombus measures 21mm x 15mm Left Atrium: Mild enlargement. No thrombus seen in LAA Pulmonary Veins: Normal configuration Right Ventricle: Mild dilatation Right Atrium: Mild enlargement Cardiac valves: No calcifications Thoracic aorta: Normal size Pulmonary Arteries: Dilated main PA measuring 32mm Systemic Veins: Normal drainage Pericardium: Normal thickness Coronary arteries: S/p CABG with patent LIMA to LAD, SVG to RPDA and SVG to LCX IMPRESSION: 1. 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 S/p CABG with patent LIMA to LAD, SVG to RPDA, and SVG to LCX 3. Dilated main pulmonary artery  measures 32mm Electronically Signed   By: Epifanio Lesches M.D.   On: 01/03/2024 17:00   DG ESOPHAGUS W SINGLE CM (SOL OR THIN BA) Result Date: 01/03/2024 CLINICAL DATA:  Patient admitted with CVA, concern for apical thrombus on TTE - unable to undergo TEE due to previously reported esophageal stricture on esophagram in 2021 which per patient has not been treated. Request for esophagram to evaluate previous stricture. Patient denies any difficulty swallowing, his only complaint is reflux. EXAM: ESOPHAGUS/BARIUM SWALLOW/TABLET STUDY TECHNIQUE: Single contrast examination was performed using thin liquid barium. This exam was performed by Lynnette Caffey, PA-C, and was supervised and interpreted by Marliss Coots, MD. FLUOROSCOPY: Radiation Exposure Index (as provided by the fluoroscopic device): 91.00 mGy Kerma COMPARISON:  Esophagram 12/02/20. FINDINGS: Swallowing: Appears normal. No vestibular penetration or aspiration seen on problem focused exam. Pharynx: Unremarkable. Esophagus: Normal appearance. Smooth, uniform narrowing at the gastroesophageal junction which appears to be more distal and not as significant or well defined as the stricture seen on previous esophagram. This may be due to the inability of the patient to tolerate double contrast exam today. Esophageal motility: At least moderate dysmotility with tertiary contractions seen in the lower esophagus. Motility exam is limited due to patient inability to lay prone. Hiatal Hernia: None. Gastroesophageal reflux: None visualized on limited exam. Ingested 13 mm barium tablet: Became stuck just proximal to the at the gastroesophageal junction for approximately 3 minutes before passing into the stomach after additional sips of water and thin barium. Other: Patient unable to stand due to BKA and weakness - entire exam performed in supine AP and LPO with exam table tilted to 30 degrees. He is unable to tolerate gas crystals due to stomach pain per his  report. IMPRESSION: Single contrast esophagram significant for: 1. Smooth, uniform narrowing at the gastroesophageal junction. This does not appear to directly correlate to previously seen stricture on 2021 esophagram, however this may be due to difference in exam technique as patient is unable to tolerate double contrast exam today. Consider EGD for further evaluation. 2. 13 mm tablet became stuck at just proximal to the gastroesophageal junction for approximately 3 minutes before passing into the stomach. This area roughly correlates to previously seen stricture. 3.  At least moderate esophageal dysmotility Electronically Signed   By: Marliss Coots M.D.   On: 01/03/2024 10:19   ECHOCARDIOGRAM COMPLETE Result Date: 01/02/2024    ECHOCARDIOGRAM REPORT   Patient Name:   Travis [name] Date of Exam: 01/02/2024 Medical Rec #:  [id number]        Height:       74.0 in Accession #:    [id number]       Weight:       275.6 lb Date of Birth:  03/07/1971         BSA:  2.491 m Patient Age:    52 years         BP:           163/70 mmHg Patient Gender: M                HR:           80 bpm. Exam Location:  Inpatient Procedure: 2D Echo, Cardiac Doppler, Color Doppler and Intracardiac            Opacification Agent Indications:    Stroke  History:        Patient has prior history of Echocardiogram examinations, most                 recent 06/25/2018. CAD; Risk Factors:Diabetes and Hypertension.  Sonographer:    Vern Claude Referring Phys: [id number] DEVON SHAFER  Sonographer Comments: Technically difficult study due to poor echo windows and suboptimal subcostal window. IMPRESSIONS  1. Possible small LV apical thrombus (clip 78-79). Image quality is suboptimal. In setting of stroke, consider cardiac MRI to further assess.  2. Left ventricular ejection fraction, by estimation, is 45 to 50%. The left ventricle has mildly decreased function. Left ventricular endocardial border not optimally defined to evaluate regional wall  motion. There is mild left ventricular hypertrophy.  Indeterminate diastolic filling due to E-A fusion. There is abnormal septal motion.  3. Right ventricular systolic function is normal. The right ventricular size is normal. Tricuspid regurgitation signal is inadequate for assessing PA pressure.  4. The mitral valve is grossly normal. Trivial mitral valve regurgitation.  5. The aortic valve was not well visualized. Aortic valve regurgitation is not visualized. No aortic stenosis is present.  6. The inferior vena cava is normal in size with <50% respiratory variability, suggesting right atrial pressure of 8 mmHg. FINDINGS  Left Ventricle: Left ventricular ejection fraction, by estimation, is 45 to 50%. The left ventricle has mildly decreased function. Left ventricular endocardial border not optimally defined to evaluate regional wall motion. The left ventricular internal cavity size was normal in size. There is mild left ventricular hypertrophy. Abnormal septal motion. Indeterminate diastolic filling due to E-A fusion. Right Ventricle: The right ventricular size is normal. Right vetricular wall thickness was not well visualized. Right ventricular systolic function is normal. Tricuspid regurgitation signal is inadequate for assessing PA pressure. Left Atrium: Left atrial size was normal in size. Right Atrium: Right atrial size was normal in size. Pericardium: There is no evidence of pericardial effusion. Mitral Valve: The mitral valve is grossly normal. Trivial mitral valve regurgitation. MV peak gradient, 2.8 mmHg. The mean mitral valve gradient is 2.0 mmHg. Tricuspid Valve: The tricuspid valve is not well visualized. Tricuspid valve regurgitation is trivial. Aortic Valve: The aortic valve was not well visualized. Aortic valve regurgitation is not visualized. No aortic stenosis is present. Aortic valve mean gradient measures 3.0 mmHg. Aortic valve peak gradient measures 4.2 mmHg. Aortic valve area, by VTI measures  3.08 cm. Pulmonic Valve: The pulmonic valve was not well visualized. Pulmonic valve regurgitation is trivial. Aorta: The aortic root is normal in size and structure. Venous: The inferior vena cava was not well visualized. The inferior vena cava is normal in size with less than 50% respiratory variability, suggesting right atrial pressure of 8 mmHg. IAS/Shunts: The interatrial septum was not well visualized.  LEFT VENTRICLE PLAX 2D LVIDd:         5.10 cm      Diastology LVIDs:  3.90 cm      LV e' medial:    6.64 cm/s LV PW:         1.10 cm      LV E/e' medial:  13.2 LV IVS:        0.80 cm      LV e' lateral:   8.70 cm/s LVOT diam:     2.20 cm      LV E/e' lateral: 10.1 LV SV:         75 LV SV Index:   30 LVOT Area:     3.80 cm  LV Volumes (MOD) LV vol d, MOD A4C: 199.0 ml LV vol s, MOD A4C: 109.0 ml LV SV MOD A4C:     199.0 ml RIGHT VENTRICLE            IVC RV Basal diam:  3.60 cm    IVC diam: 1.50 cm RV S prime:     6.74 cm/s TAPSE (M-mode): 1.7 cm LEFT ATRIUM             Index        RIGHT ATRIUM           Index LA diam:        4.00 cm 1.61 cm/m   RA Area:     16.50 cm LA Vol (A2C):   74.3 ml 29.83 ml/m  RA Volume:   41.40 ml  16.62 ml/m LA Vol (A4C):   73.1 ml 29.35 ml/m LA Biplane Vol: 75.7 ml 30.39 ml/m  AORTIC VALVE                    PULMONIC VALVE AV Area (Vmax):    3.30 cm     PV Vmax:       0.86 m/s AV Area (Vmean):   2.96 cm     PV Peak grad:  3.0 mmHg AV Area (VTI):     3.08 cm AV Vmax:           103.00 cm/s AV Vmean:          82.200 cm/s AV VTI:            0.242 m AV Peak Grad:      4.2 mmHg AV Mean Grad:      3.0 mmHg LVOT Vmax:         89.40 cm/s LVOT Vmean:        64.100 cm/s LVOT VTI:          0.196 m LVOT/AV VTI ratio: 0.81  AORTA Ao Root diam: 3.10 cm Ao Asc diam:  3.00 cm MITRAL VALVE MV Area (PHT): 4.60 cm    SHUNTS MV Area VTI:   4.46 cm    Systemic VTI:  0.20 m MV Peak grad:  2.8 mmHg    Systemic Diam: 2.20 cm MV Mean grad:  2.0 mmHg MV Vmax:       0.83 m/s MV Vmean:       65.1 cm/s MV Decel Time: 165 msec MV E velocity: 87.50 cm/s MV A velocity: 78.60 cm/s MV E/A ratio:  1.11 Weston Brass MD Electronically signed by Weston Brass MD Signature Date/Time: 01/02/2024/11:57:47 AM    Final    MR BRAIN WO CONTRAST Result Date: 01/02/2024 CLINICAL DATA:  Stroke, follow up EXAM: MRI HEAD WITHOUT CONTRAST TECHNIQUE: Multiplanar, multiecho pulse sequences of the brain and surrounding structures were obtained without intravenous contrast. COMPARISON:  Head CT 01/02/2024 FINDINGS: Brain: Acute infarcts in the  right MCA territory involving the body of the caudate on the right, right insular cortex, right frontal lobe. No hemorrhage. No hydrocephalus. No extra-axial fluid collection. Chronic left thalamic and right occipital no mass effect. No mass lesion. Vascular: Normal flow voids. Skull and upper cervical spine: Normal marrow signal. Sinuses/Orbits: Trace left mastoid effusion. No middle ear effusion. Paranasal sinuses are clear. Left lens replacement. Orbits are otherwise unremarkable. Other: None. IMPRESSION: Acute infarcts in the right MCA territory involving the body of the caudate on the right, right insular cortex, and right frontal lobe. No hemorrhage or mass effect. Electronically Signed   By: Lorenza Cambridge M.D.   On: 01/02/2024 10:16   CT ANGIO HEAD NECK W WO CM (CODE STROKE) Result Date: 01/02/2024 CLINICAL DATA:  Neuro deficit, acute, stroke suspected. Slurred speech. EXAM: CT ANGIOGRAPHY HEAD AND NECK WITH AND WITHOUT CONTRAST TECHNIQUE: Multidetector CT imaging of the head and neck was performed using the standard protocol during bolus administration of intravenous contrast. Multiplanar CT image reconstructions and MIPs were obtained to evaluate the vascular anatomy. Carotid stenosis measurements (when applicable) are obtained utilizing NASCET criteria, using the distal internal carotid diameter as the denominator. RADIATION DOSE REDUCTION: This exam was performed  according to the departmental dose-optimization program which includes automated exposure control, adjustment of the mA and/or kV according to patient size and/or use of iterative reconstruction technique. CONTRAST:  75mL OMNIPAQUE IOHEXOL 350 MG/ML SOLN COMPARISON:  CTA head and neck 06/25/2018 FINDINGS: CTA NECK FINDINGS Aortic arch: Normal variant aortic arch branching pattern with common origin of the brachiocephalic and left common carotid arteries. Widely patent arch vessel origins. Right carotid system: Patent without evidence of stenosis or dissection. Left carotid system: Patent without evidence of stenosis or dissection. Vertebral arteries: New short segment occlusion of the right vertebral artery at its origin over an approximately 1 cm long segment with the vessel being patent throughout the remainder of the neck. Patent left vertebral artery with suboptimal assessment of the proximal V1 segment but no evidence of a significant stenosis more distally. Skeleton: Mild cervical spondylosis. Other neck: No evidence of cervical lymphadenopathy or mass. Upper chest: Clear lung apices. Review of the MIP images confirms the above findings CTA HEAD FINDINGS Anterior circulation: The internal carotid arteries are patent from skull base to carotid termini with mild atherosclerosis not resulting in a significant stenosis. ACAs and MCAs are patent proximally without evidence of a significant A1 or M1 stenosis. There is occlusion of a mid right M2 branch vessel with distal reconstitution. No aneurysm is identified. Posterior circulation: The intracranial vertebral arteries are patent to the basilar with moderate multifocal narrowing of the right V4 segment. Patent PICA and SCA origins are visualized bilaterally. The basilar artery is widely patent. There are small right and large left posterior communicating arteries. The PCAs are patent with branch vessel irregularity, including a severe proximal left P3 stenosis  with diminished opacification distally. No aneurysm is identified. Venous sinuses: Patent. Anatomic variants: None. Review of the MIP images confirms the above findings These results were communicated to Dr. Iver Nestle at 8:42 am on 01/02/2024 by text page via the [facility] messaging system. IMPRESSION: 1. Occlusion of a mid right M2 branch vessel with distal reconstitution. 2. Short segment occlusion of the right vertebral artery at its origin. 3. Intracranial atherosclerosis including moderate right V4 and severe left P3 stenoses. Electronically Signed   By: Sebastian Ache M.D.   On: 01/02/2024 08:53   CT HEAD CODE STROKE WO CONTRAST  Result Date: 01/02/2024 CLINICAL DATA:  Code stroke.  Slurred speech EXAM: CT HEAD WITHOUT CONTRAST TECHNIQUE: Contiguous axial images were obtained from the base of the skull through the vertex without intravenous contrast. RADIATION DOSE REDUCTION: This exam was performed according to the departmental dose-optimization program which includes automated exposure control, adjustment of the mA and/or kV according to patient size and/or use of iterative reconstruction technique. COMPARISON:  06/26/2018 brain MRI FINDINGS: Brain: No evidence of acute infarction, hemorrhage, hydrocephalus, extra-axial collection or mass lesion/mass effect. 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 Vascular: No hyperdense vessel. Premature atheromatous calcification. Skull: Normal. Negative for fracture or focal lesion. Sinuses/Orbits: No acute finding Other: Prelim sent to neurology in epic CT. ASPECTS ([place] Stroke Program Early CT Score) - Ganglionic level infarction (caudate, lentiform nuclei, internal capsule, insula, M1-M3 cortex): 7 - Supraganglionic infarction (M4-M6 cortex): 3 Total score (0-10 with 10 being normal): 10 IMPRESSION: Chronic infarcts in the known from 2019. No acute or interval finding. Electronically Signed   By:  Tiburcio Pea M.D.   On: 01/02/2024 07:55     PHYSICAL EXAM  Temp:  [97.5 F (36.4 C)-98.7 F (37.1 C)] 97.6 F (36.4 C) (01/21 1626) Pulse Rate:  [72-82] 72 (01/21 1626) Resp:  [16-18] 17 (01/21 1626) BP: (141-157)/(63-89) 152/89 (01/21 1626) SpO2:  [96 %-99 %] 96 % (01/21 1626)  General - Well nourished, well developed, in no apparent distress.  Ophthalmologic - fundi not visualized due to noncooperation.  Cardiovascular - Regular rhythm and rate.  Mental Status -  Level of arousal and orientation to time, place, and person were intact. Language including expression, naming, repetition, comprehension was assessed and found intact.  Mild dysarthria Fund of Knowledge was assessed and was intact.  Cranial Nerves II - XII - II - Visual field intact OU. III, IV, VI - Extraocular movements intact. V - Facial sensation intact bilaterally. VII - Facial movement intact bilaterally. VIII - Hearing & vestibular intact bilaterally. X - Palate elevates symmetrically.  Mild dysarthria XI - Chin turning & shoulder shrug intact bilaterally. XII - Tongue protrusion intact.  Motor Strength - The patient's strength was normal in all extremities and pronator drift was absent except decreased left hand finger grip and left lower extremity BKA.  Bulk was normal and fasciculations were absent.   Motor Tone - Muscle tone was assessed at the neck and appendages and was normal.  Reflexes - The patient's reflexes were symmetrical in all extremities and he had no pathological reflexes.  Sensory - Light touch, temperature/pinprick were assessed and were symmetrical.    Coordination - The patient had normal movements in the hands with no ataxia or dysmetria.  Tremor was absent.  Gait and Station - deferred.   ASSESSMENT/PLAN Mr. IGNAC ECKSTEIN is a 53 y.o. male with history of CHF, CAD/MI status post CABG in 11/2017, PVD status post left BKA, history of LV aneurysm, stroke in 06/2018 admitted  for left upper extremity weakness, left facial droop, slurred speech and elevated BP and glucose. No TNK given due to outside window.    Stroke:  right MCA territory acute and subacute infarct embolic likely secondary to LV thrombus CT no acute abnormality CT head and neck right M2 mid branch occlusion with distal reconstitution.  Right VA origin short segment occlusion, right V4 and left P3 moderate to severe stenosis. MRI right MCA acute and subacute infarcts 2D Echo EF 45 to 50%,  possible small LV thrombus Cardiac CTA showed LV thrombus and patent bypass grafts LDL 105 HgbA1c 7.7 UDS pending Eliquis for VTE prophylaxis No antithrombotic prior to admission, now on aspirin 81 mg daily and Eliquis (apixaban) daily.  Continue on discharge. Patient counseled to be compliant with his antithrombotic medications Ongoing aggressive stroke risk factor management Therapy recommendations: Home health PT Disposition: Pending  CHF CAD/MI status post CABG LV thrombus History of LV aneurysm Status post CABG in 11/2017 Current cardiac CTA showed patent bypass grafts EF 45 to 50%. 2D echo possible LV small thrombus which was confirmed by cardiac CTA Cardiology Dr. Eden Emms on board, now on Eliquis for anticoagulation.  Repeat 2D echo in 3 months to evaluate LV thrombus and decide duration of anticoagulation Continue aspirin 81.  History of stroke 06/2018 admitted for multiple small bilateral infarcts.  Status post tPA.  MRI also showed old right CR infarct.  CT head and neck right P1/P2 occlusion.  EF 35 to 40%.  Negative for DVT.  LDL 73, A1c 6.9.  TEE negative, no PFO.  Discharged on DAPT and Crestor 40.  Loop recorder placed.  No A-fib found  Diabetes HgbA1c 7.7 goal < 7.0 Uncontrolled, but improved from before Currently on insulin CBG monitoring SSI DM education and close PCP follow up  Hypertension Stable Avoid low BP Long term BP goal normotensive  Hyperlipidemia Home meds: Crestor  40, questionable compliance LDL 105, goal < 70 Now on Crestor 40 Continue statin at discharge  Other Stroke Risk Factors PVD status post left BKA Obesity, Body mass index is 35.38 kg/m.   Other Active Problems Mild leukocytosis, WBC 12.5  Hospital day # 0  Neurology will sign off. Please call with questions. Pt will follow up with stroke clinic NP at [facility] in about 4 weeks. Thanks for the consult.   Marvel Plan, MD PhD Stroke Neurology 01/03/2024 5:41 PM    To contact Stroke Continuity provider, please refer to [email or web address]. After hours, contact General Neurology

## 2024-01-03 NOTE — Plan of Care (Signed)
  Problem: Education: Goal: Knowledge of disease or condition will improve Outcome: Progressing   Problem: Coping: Goal: Will verbalize positive feelings about self Outcome: Progressing   Problem: Self-Care: Goal: Ability to participate in self-care as condition permits will improve Outcome: Progressing   Problem: Nutrition: Goal: Risk of aspiration will decrease Outcome: Progressing   Problem: Clinical Measurements: Goal: Ability to maintain clinical measurements within normal limits will improve Outcome: Progressing   Problem: Coping: Goal: Level of anxiety will decrease Outcome: Progressing   Problem: Safety: Goal: Ability to remain free from injury will improve Outcome: Progressing

## 2024-01-03 NOTE — Consult Note (Signed)
CARDIOLOGY CONSULT NOTE       Patient ID: Travis [name] MRN: [id number] DOB/AGE: 1971-03-05 53 y.o.  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 history of HTN, HLD, CVA, Esophageal stricture, GERD, DM PAD, CHF BKA and left sided stroke symptoms this am. Dysarthria, left facial droop and left sided weakness. ? Prior embolic CVA 2019 source not identified Was supposed to be on ASA/Plavix and statin but not taking CT head chronic infarcts nothing aute MRI with acute right MCA territory infarct with no hemorrhage or mass effect. CTA with M2 right occlusion and intracranial atherosclerosis Placed back on ASA/Plavix. Had old ILR that has not shown any PAF.  Reviewed echo from 01/02/24 EF 45-50% ? LV apical thrombus With definity septum/apex hypokinetic and appears to be a small mobile thrombus at apex. Initially asked to do TEE. However he has prior stricture and esophagram today showed dysmotility with uniform narrowing of GE junction and inability of 13 mm pill to pass. He is overweight and claustrophic Barely made the 10 minute MRI scan of head and indicated to me he could not tolerate a cardiac MRI to further assess.   He has not had SSCP. No acute cardiac event Cath 10/19/17 with occluded proximal LAD with right to left collaterals  and 3 vessel dx so don't think WMA is acute and EF at that time was 35-40% CABG done with LIMA to LAD, SVG to PDA and SVG to LCX 11/29/17  ROS All other systems reviewed and negative except as noted above  Past Medical History:  Diagnosis Date   Acute on chronic systolic and diastolic heart failure, NYHA class 3 (HCC) 09/14/2017   Acute osteomyelitis of metatarsal bone of left foot (HCC) 11/12/2015   CAD in native artery 09/14/2017   Closed fracture of right tibial plateau 11/11/2015   Coronary artery disease    Dehiscence of amputation stump (HCC)    left leg   Depression with anxiety    Diabetes mellitus    INSULIN DEPENDENT Type II   Diabetic peripheral neuropathy associated with type 2 diabetes mellitus (HCC) 08/30/2008   Qualifier: Diagnosis of  By: Daphine Deutscher FNP, [place]     Diabetic ulcer of left foot (HCC) 11/14/2015   Gastroparesis    GERD (gastroesophageal reflux disease)    History of loop recorder 06/2018   Last remote device check was on 02/14/2019   Hypertension    patient denies   Left ventricular aneurysm    REPORTS HE IS NOT AWAREW OF THIS    Myocardial infarction (HCC)    Osteomyelitis (HCC)    Osteoporosis 11/12/2015   Peripheral neuropathy    feet and below bilateral knees   Peripheral vascular disease (HCC)    PAD in Left leg and some in right leg   Pressure injury of left foot, stage 2 (HCC) 02/14/2019   Sepsis (HCC)  10/29/2018   Shortness of breath dyspnea    Stroke (HCC) 05/2018   Subacute osteomyelitis, left ankle and foot (HCC) 10/20/2016   Vitamin D deficiency 11/12/2015    Family History  Problem Relation Age of Onset   Cancer Paternal Grandmother    COPD Mother    Heart failure Mother    Anxiety disorder Mother    Depression Mother    Fibromyalgia Mother    Esophageal cancer Father        Smoker, still does   Diabetes Sister    Fibromyalgia Sister    Diabetes Maternal Uncle    Diabetes Maternal Grandmother    GER disease Sister     Liver disease Paternal Grandfather    Hemachromatosis Paternal Grandfather    Diabetes Maternal Grandfather     Social History   Socioeconomic History   Marital status: Divorced    Spouse name: Not on file   Number of children: 3   Years of education: HS   Highest education level: Not on file  Occupational History   Occupation: Disabled  Tobacco Use   Smoking status: Never   Smokeless tobacco: Never  Vaping Use   Vaping status: Never Used  Substance and Sexual Activity   Alcohol use: No   Drug use: No   Sexual activity: Yes  Other Topics Concern   Not on file  Social History Narrative   Lives at home with his children.   Right-handed.   No caffeine use.   Social Drivers of Corporate investment banker Strain: High Risk (06/13/2023)   Received from [facility]   Overall Financial Resource Strain (CARDIA)    Difficulty of Paying Living Expenses: Very hard  Food Insecurity: No Food Insecurity (01/02/2024)   Hunger Vital Sign    Worried About Running Out of Food in the Last Year: Never true    Ran Out of Food in the Last Year: Never true  Transportation Needs: No Transportation Needs (01/02/2024)   PRAPARE - Administrator, Civil Service (Medical): No    Lack of Transportation (Non-Medical): No  Physical Activity: Unknown (06/13/2023)   Received from [facility]   Exercise Vital Sign    Days of Exercise per Week: 0 days    Minutes of Exercise per Session: Not on file  Stress: Stress Concern Present (06/13/2023)   Received from [facility] - Occupational Stress Questionnaire    Feeling of Stress : Very much  Social Connections: Socially Isolated (06/13/2023)   Received from [facility]   Social Network    How would you rate your social network (family, work, friends)?: Little participation, lonely and socially isolated  Intimate Partner Violence: Not At Risk (01/02/2024)   Humiliation, Afraid, Rape, and Kick  questionnaire    Fear of Current or Ex-Partner: No    Emotionally Abused: No    Physically Abused: No    Sexually Abused: No    Past Surgical History:  Procedure Laterality Date   AMPUTATION Left 11/28/2015   Procedure: Left 4th Ray Amputation;  Surgeon: Nadara Mustard, MD;  Location[facility];  Service: Orthopedics;  Laterality: Left;   AMPUTATION Left 10/29/2016   Procedure: LEFT 5TH RAY AMPUTATION;  Surgeon: Nadara Mustard, MD;  Location: [facility] OR;  Service: Orthopedics;  Laterality: Left;   AMPUTATION Left 03/09/2019   Procedure: LEFT FOOT 5TH RAY AMPUTATION;  Surgeon: Nadara Mustard, MD;  Location: [facility] OR;  Service:  Orthopedics;  Laterality: Left;   AMPUTATION Left 04/18/2019   Procedure: LEFT BELOW KNEE AMPUTATION;  Surgeon: Nadara Mustard, MD;  Location: [facility] OR;  Service: Orthopedics;  Laterality: Left;   ANTERIOR VITRECTOMY Left 04/12/2016   Procedure: ANTERIOR CHAMBER WASH OUT WITH GAS FLUID EXCHANGE;  Surgeon: Carmela Rima, MD;  Location: [facility] OR;  Service: Ophthalmology;  Laterality: Left;   CARDIAC CATHETERIZATION  10/2017   CATARACT EXTRACTION Left    CHOLECYSTECTOMY N/A 01/04/2019   Procedure: LAPAROSCOPIC CHOLECYSTECTOMY;  Surgeon: Gaynelle Adu, MD;  Location: [facility] ORS;  Service: General;  Laterality: N/A;   CORONARY ARTERY BYPASS GRAFT N/A 11/29/2017    LIMA-LAD, SVG-PDA, SVG-CFX  Procedure: CORONARY ARTERY BYPASS GRAFTING (CABG) times three using the left saphaneous vien. harvested endoscopicly and left internal mammary artery.;  Surgeon: Kerin Perna, MD;  Location: [facility] OR;  Service: Open Heart Surgery;  Laterality: N/A;   EYE SURGERY     left eye surgery 04/2016   IR CHOLANGIOGRAM EXISTING TUBE  10/17/2018   IR CHOLANGIOGRAM EXISTING TUBE  10/30/2018   IR PERC CHOLECYSTOSTOMY  09/05/2018   KNEE SURGERY Left    LOOP RECORDER INSERTION N/A 06/28/2018   Procedure: LOOP RECORDER INSERTION;  Surgeon: Duke Salvia, MD;  Location: [facility] INVASIVE CV LAB;  Service: Cardiovascular;  Laterality:  N/A;   RIGHT/LEFT HEART CATH AND CORONARY ANGIOGRAPHY N/A 10/19/2017   Procedure: RIGHT/LEFT HEART CATH AND CORONARY ANGIOGRAPHY;  Surgeon: [place], Roland Prine M, MD;  Location: [facility] INVASIVE CV LAB;  Service: Cardiovascular;  Laterality: N/A;   SHOULDER SURGERY Right    TEE WITHOUT CARDIOVERSION N/A 11/29/2017   Procedure: TRANSESOPHAGEAL ECHOCARDIOGRAM (TEE);  Surgeon: Donata Clay, Theron Arista, MD;  Location: [facility] OR;  Service: Open Heart Surgery;  Laterality: N/A;   TEE WITHOUT CARDIOVERSION N/A 06/28/2018   Procedure: TRANSESOPHAGEAL ECHOCARDIOGRAM (TEE);  Surgeon: Jake Bathe, MD;  Location: [facility] ENDOSCOPY;  Service: Cardiovascular;  Laterality: N/A;   TOE AMPUTATION Left 11/28/15   4th toe      Current Facility-Administered Medications:    acetaminophen (TYLENOL) tablet 650 mg, 650 mg, Oral, Q4H PRN **OR** acetaminophen (TYLENOL) 160 MG/5ML solution 650 mg, 650 mg, Per Tube, Q4H PRN **OR** acetaminophen (TYLENOL) suppository 650 mg, 650 mg, Rectal, Q4H PRN, Synetta Fail, MD   aspirin EC tablet 81 mg, 81 mg, Oral, Daily, Synetta Fail, MD, 81 mg at 01/03/24 1109   clopidogrel (PLAVIX) tablet 75 mg, 75 mg, Oral, Daily, Synetta Fail, MD, 75 mg at 01/03/24 1108   enoxaparin (LOVENOX) injection 40 mg, 40 mg, Subcutaneous, Daily, Synetta Fail, MD, 40 mg at 01/02/24 1350   insulin aspart (novoLOG) injection 0-9 Units, 0-9 Units, Subcutaneous, Q4H, John Giovanni, MD, 2 Units at 01/03/24 0850   metoprolol tartrate (LOPRESSOR) tablet 100 mg, 100 mg, Oral, Once, Wendall Stade, MD   ondansetron [facility]) injection 4 mg, 4 mg, Intravenous, Q6H PRN, Synetta Fail, MD, 4 mg at 01/02/24 2127   pantoprazole (PROTONIX) EC tablet 40 mg, 40 mg, Oral, Daily, Synetta Fail, MD, 40 mg at 01/03/24 1108   rosuvastatin (CRESTOR) tablet 40 mg, 40 mg, Oral, Daily, Synetta Fail, MD, 40 mg at 01/03/24 1109   senna-docusate (Senokot-S) tablet 1 tablet, 1 tablet, Oral, QHS PRN, Synetta Fail, MD  aspirin EC  81 mg Oral Daily   clopidogrel  75 mg Oral Daily   enoxaparin (LOVENOX) injection  40 mg Subcutaneous Daily   insulin aspart  0-9 Units Subcutaneous Q4H  metoprolol tartrate  100 mg Oral Once   pantoprazole  40 mg Oral Daily   rosuvastatin  40 mg Oral Daily     Physical Exam: Blood pressure (!) 157/80, pulse 82, temperature 97.7 F (36.5 C), temperature source Oral, resp. rate 16, height 6\' 2"  (1.88 m), weight 125 kg, SpO2 96%.    Overweight male Dysarthria with mild left facial droop and LUE weakness Left BKA No murmur prior sternotomy Prior ILR insertion Abdomen benign  Labs:   Lab Results  Component Value Date   WBC 12.5 (H) 01/03/2024   HGB 15.2 01/03/2024   HCT 45.0 01/03/2024   MCV 88.4 01/03/2024   PLT 328 01/03/2024    Recent Labs  Lab 01/03/24 0433  NA 135  K 3.8  CL 101  CO2 26  BUN 8  CREATININE 1.13  CALCIUM 8.8*  PROT 7.1  BILITOT 1.7*  ALKPHOS 63  ALT 11  AST 16  GLUCOSE 213*   Lab Results  Component Value Date   CKTOTAL 90 10/06/2016   TROPONINI 0.06 (HH) 12/18/2017    Lab Results  Component Value Date   CHOL 162 01/03/2024   CHOL 141 06/26/2018   CHOL 75 (L) 01/23/2018   Lab Results  Component Value Date   HDL 25 (L) 01/03/2024   HDL 26 (L) 06/26/2018   HDL 32 (L) 01/23/2018   Lab Results  Component Value Date   LDLCALC 105 (H) 01/03/2024   LDLCALC 73 06/26/2018   LDLCALC 29 01/23/2018   Lab Results  Component Value Date   TRIG 162 (H) 01/03/2024   TRIG 211 (H) 06/26/2018   TRIG 68 01/23/2018   Lab Results  Component Value Date   CHOLHDL 6.5 01/03/2024   CHOLHDL 5.4 06/26/2018   CHOLHDL 2.3 01/23/2018   No results found for: "LDLDIRECT"    Radiology: DG ESOPHAGUS W SINGLE CM (SOL OR THIN BA) Result Date: 01/03/2024 CLINICAL DATA:  Patient admitted with CVA, concern for apical thrombus on TTE - unable to undergo TEE due to previously reported esophageal stricture on esophagram in 2021  which per patient has not been treated. Request for esophagram to evaluate previous stricture. Patient denies any difficulty swallowing, his only complaint is reflux. EXAM: ESOPHAGUS/BARIUM SWALLOW/TABLET STUDY TECHNIQUE: Single contrast examination was performed using thin liquid barium. This exam was performed by Lynnette Caffey, PA-C, and was supervised and interpreted by Marliss Coots, MD. FLUOROSCOPY: Radiation Exposure Index (as provided by the fluoroscopic device): 91.00 mGy Kerma COMPARISON:  Esophagram 12/02/20. FINDINGS: Swallowing: Appears normal. No vestibular penetration or aspiration seen on problem focused exam. Pharynx: Unremarkable. Esophagus: Normal appearance. Smooth, uniform narrowing at the gastroesophageal junction which appears to be more distal and not as significant or well defined as the stricture seen on previous esophagram. This may be due to the inability of the patient to tolerate double contrast exam today. Esophageal motility: At least moderate dysmotility with tertiary contractions seen in the lower esophagus. Motility exam is limited due to patient inability to lay prone. Hiatal Hernia: None. Gastroesophageal reflux: None visualized on limited exam. Ingested 13 mm barium tablet: Became stuck just proximal to the at the gastroesophageal junction for approximately 3 minutes before passing into the stomach after additional sips of water and thin barium. Other: Patient unable to stand due to BKA and weakness - entire exam performed in supine AP and LPO with exam table tilted to 30 degrees. He is unable to tolerate gas crystals due to stomach pain per his report.  IMPRESSION: Single contrast esophagram significant for: 1. Smooth, uniform narrowing at the gastroesophageal junction. This does not appear to directly correlate to previously seen stricture on 2021 esophagram, however this may be due to difference in exam technique as patient is unable to tolerate double contrast exam today.  Consider EGD for further evaluation. 2. 13 mm tablet became stuck at just proximal to the gastroesophageal junction for approximately 3 minutes before passing into the stomach. This area roughly correlates to previously seen stricture. 3.  At least moderate esophageal dysmotility Electronically Signed   By: Marliss Coots M.D.   On: 01/03/2024 10:19   ECHOCARDIOGRAM COMPLETE Result Date: 01/02/2024    ECHOCARDIOGRAM REPORT   Patient Name:   Travis [name] Date of Exam: 01/02/2024 Medical Rec #:  [id number]        Height:       74.0 in Accession #:    [id number]       Weight:       275.6 lb Date of Birth:  06-19-1971         BSA:          2.491 m Patient Age:    52 years         BP:           163/70 mmHg Patient Gender: M                HR:           80 bpm. Exam Location:  Inpatient Procedure: 2D Echo, Cardiac Doppler, Color Doppler and Intracardiac            Opacification Agent Indications:    Stroke  History:        Patient has prior history of Echocardiogram examinations, most                 recent 06/25/2018. CAD; Risk Factors:Diabetes and Hypertension.  Sonographer:    Vern Claude Referring Phys: [id number] DEVON SHAFER  Sonographer Comments: Technically difficult study due to poor echo windows and suboptimal subcostal window. IMPRESSIONS  1. Possible small LV apical thrombus (clip 78-79). Image quality is suboptimal. In setting of stroke, consider cardiac MRI to further assess.  2. Left ventricular ejection fraction, by estimation, is 45 to 50%. The left ventricle has mildly decreased function. Left ventricular endocardial border not optimally defined to evaluate regional wall motion. There is mild left ventricular hypertrophy.  Indeterminate diastolic filling due to E-A fusion. There is abnormal septal motion.  3. Right ventricular systolic function is normal. The right ventricular size is normal. Tricuspid regurgitation signal is inadequate for assessing PA pressure.  4. The mitral valve is grossly normal.  Trivial mitral valve regurgitation.  5. The aortic valve was not well visualized. Aortic valve regurgitation is not visualized. No aortic stenosis is present.  6. The inferior vena cava is normal in size with <50% respiratory variability, suggesting right atrial pressure of 8 mmHg. FINDINGS  Left Ventricle: Left ventricular ejection fraction, by estimation, is 45 to 50%. The left ventricle has mildly decreased function. Left ventricular endocardial border not optimally defined to evaluate regional wall motion. The left ventricular internal cavity size was normal in size. There is mild left ventricular hypertrophy. Abnormal septal motion. Indeterminate diastolic filling due to E-A fusion. Right Ventricle: The right ventricular size is normal. Right vetricular wall thickness was not well visualized. Right ventricular systolic function is normal. Tricuspid regurgitation signal is inadequate for assessing PA pressure. Left Atrium: Left atrial size  was normal in size. Right Atrium: Right atrial size was normal in size. Pericardium: There is no evidence of pericardial effusion. Mitral Valve: The mitral valve is grossly normal. Trivial mitral valve regurgitation. MV peak gradient, 2.8 mmHg. The mean mitral valve gradient is 2.0 mmHg. Tricuspid Valve: The tricuspid valve is not well visualized. Tricuspid valve regurgitation is trivial. Aortic Valve: The aortic valve was not well visualized. Aortic valve regurgitation is not visualized. No aortic stenosis is present. Aortic valve mean gradient measures 3.0 mmHg. Aortic valve peak gradient measures 4.2 mmHg. Aortic valve area, by VTI measures 3.08 cm. Pulmonic Valve: The pulmonic valve was not well visualized. Pulmonic valve regurgitation is trivial. Aorta: The aortic root is normal in size and structure. Venous: The inferior vena cava was not well visualized. The inferior vena cava is normal in size with less than 50% respiratory variability, suggesting right atrial  pressure of 8 mmHg. IAS/Shunts: The interatrial septum was not well visualized.  LEFT VENTRICLE PLAX 2D LVIDd:         5.10 cm      Diastology LVIDs:         3.90 cm      LV e' medial:    6.64 cm/s LV PW:         1.10 cm      LV E/e' medial:  13.2 LV IVS:        0.80 cm      LV e' lateral:   8.70 cm/s LVOT diam:     2.20 cm      LV E/e' lateral: 10.1 LV SV:         75 LV SV Index:   30 LVOT Area:     3.80 cm  LV Volumes (MOD) LV vol d, MOD A4C: 199.0 ml LV vol s, MOD A4C: 109.0 ml LV SV MOD A4C:     199.0 ml RIGHT VENTRICLE            IVC RV Basal diam:  3.60 cm    IVC diam: 1.50 cm RV S prime:     6.74 cm/s TAPSE (M-mode): 1.7 cm LEFT ATRIUM             Index        RIGHT ATRIUM           Index LA diam:        4.00 cm 1.61 cm/m   RA Area:     16.50 cm LA Vol (A2C):   74.3 ml 29.83 ml/m  RA Volume:   41.40 ml  16.62 ml/m LA Vol (A4C):   73.1 ml 29.35 ml/m LA Biplane Vol: 75.7 ml 30.39 ml/m  AORTIC VALVE                    PULMONIC VALVE AV Area (Vmax):    3.30 cm     PV Vmax:       0.86 m/s AV Area (Vmean):   2.96 cm     PV Peak grad:  3.0 mmHg AV Area (VTI):     3.08 cm AV Vmax:           103.00 cm/s AV Vmean:          82.200 cm/s AV VTI:            0.242 m AV Peak Grad:      4.2 mmHg AV Mean Grad:      3.0 mmHg LVOT Vmax:  89.40 cm/s LVOT Vmean:        64.100 cm/s LVOT VTI:          0.196 m LVOT/AV VTI ratio: 0.81  AORTA Ao Root diam: 3.10 cm Ao Asc diam:  3.00 cm MITRAL VALVE MV Area (PHT): 4.60 cm    SHUNTS MV Area VTI:   4.46 cm    Systemic VTI:  0.20 m MV Peak grad:  2.8 mmHg    Systemic Diam: 2.20 cm MV Mean grad:  2.0 mmHg MV Vmax:       0.83 m/s MV Vmean:      65.1 cm/s MV Decel Time: 165 msec MV E velocity: 87.50 cm/s MV A velocity: 78.60 cm/s MV E/A ratio:  1.11 Weston Brass MD Electronically signed by Weston Brass MD Signature Date/Time: 01/02/2024/11:57:47 AM    Final    MR BRAIN WO CONTRAST Result Date: 01/02/2024 CLINICAL DATA:  Stroke, follow up EXAM: MRI HEAD WITHOUT  CONTRAST TECHNIQUE: Multiplanar, multiecho pulse sequences of the brain and surrounding structures were obtained without intravenous contrast. COMPARISON:  Head CT 01/02/2024 FINDINGS: Brain: Acute infarcts in the right MCA territory involving the body of the caudate on the right, right insular cortex, right frontal lobe. No hemorrhage. No hydrocephalus. No extra-axial fluid collection. Chronic left thalamic and right occipital no mass effect. No mass lesion. Vascular: Normal flow voids. Skull and upper cervical spine: Normal marrow signal. Sinuses/Orbits: Trace left mastoid effusion. No middle ear effusion. Paranasal sinuses are clear. Left lens replacement. Orbits are otherwise unremarkable. Other: None. IMPRESSION: Acute infarcts in the right MCA territory involving the body of the caudate on the right, right insular cortex, and right frontal lobe. No hemorrhage or mass effect. Electronically Signed   By: Lorenza Cambridge M.D.   On: 01/02/2024 10:16   CT ANGIO HEAD NECK W WO CM (CODE STROKE) Result Date: 01/02/2024 CLINICAL DATA:  Neuro deficit, acute, stroke suspected. Slurred speech. EXAM: CT ANGIOGRAPHY HEAD AND NECK WITH AND WITHOUT CONTRAST TECHNIQUE: Multidetector CT imaging of the head and neck was performed using the standard protocol during bolus administration of intravenous contrast. Multiplanar CT image reconstructions and MIPs were obtained to evaluate the vascular anatomy. Carotid stenosis measurements (when applicable) are obtained utilizing NASCET criteria, using the distal internal carotid diameter as the denominator. RADIATION DOSE REDUCTION: This exam was performed according to the departmental dose-optimization program which includes automated exposure control, adjustment of the mA and/or kV according to patient size and/or use of iterative reconstruction technique. CONTRAST:  75mL OMNIPAQUE IOHEXOL 350 MG/ML SOLN COMPARISON:  CTA head and neck 06/25/2018 FINDINGS: CTA NECK FINDINGS Aortic  arch: Normal variant aortic arch branching pattern with common origin of the brachiocephalic and left common carotid arteries. Widely patent arch vessel origins. Right carotid system: Patent without evidence of stenosis or dissection. Left carotid system: Patent without evidence of stenosis or dissection. Vertebral arteries: New short segment occlusion of the right vertebral artery at its origin over an approximately 1 cm long segment with the vessel being patent throughout the remainder of the neck. Patent left vertebral artery with suboptimal assessment of the proximal V1 segment but no evidence of a significant stenosis more distally. Skeleton: Mild cervical spondylosis. Other neck: No evidence of cervical lymphadenopathy or mass. Upper chest: Clear lung apices. Review of the MIP images confirms the above findings CTA HEAD FINDINGS Anterior circulation: The internal carotid arteries are patent from skull base to carotid termini with mild atherosclerosis not resulting in a significant stenosis. ACAs  and MCAs are patent proximally without evidence of a significant A1 or M1 stenosis. There is occlusion of a mid right M2 branch vessel with distal reconstitution. No aneurysm is identified. Posterior circulation: The intracranial vertebral arteries are patent to the basilar with moderate multifocal narrowing of the right V4 segment. Patent PICA and SCA origins are visualized bilaterally. The basilar artery is widely patent. There are small right and large left posterior communicating arteries. The PCAs are patent with branch vessel irregularity, including a severe proximal left P3 stenosis with diminished opacification distally. No aneurysm is identified. Venous sinuses: Patent. Anatomic variants: None. Review of the MIP images confirms the above findings These results were communicated to Dr. Iver Nestle at 8:42 am on 01/02/2024 by text page via the [facility] messaging system. IMPRESSION: 1. Occlusion of a mid right M2 branch  vessel with distal reconstitution. 2. Short segment occlusion of the right vertebral artery at its origin. 3. Intracranial atherosclerosis including moderate right V4 and severe left P3 stenoses. Electronically Signed   By: Sebastian Ache M.D.   On: 01/02/2024 08:53   CT HEAD CODE STROKE WO CONTRAST Result Date: 01/02/2024 CLINICAL DATA:  Code stroke.  Slurred speech EXAM: CT HEAD WITHOUT CONTRAST TECHNIQUE: Contiguous axial images were obtained from the base of the skull through the vertex without intravenous contrast. RADIATION DOSE REDUCTION: This exam was performed according to the departmental dose-optimization program which includes automated exposure control, adjustment of the mA and/or kV according to patient size and/or use of iterative reconstruction technique. COMPARISON:  06/26/2018 brain MRI FINDINGS: Brain: No evidence of acute infarction, hemorrhage, hydrocephalus, extra-axial collection or mass lesion/mass effect. 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 Vascular: No hyperdense vessel. Premature atheromatous calcification. Skull: Normal. Negative for fracture or focal lesion. Sinuses/Orbits: No acute finding Other: Prelim sent to neurology in epic CT. ASPECTS (Alberta Stroke Program Early CT Score) - Ganglionic level infarction (caudate, lentiform nuclei, internal capsule, insula, M1-M3 cortex): 7 - Supraganglionic infarction (M4-M6 cortex): 3 Total score (0-10 with 10 being normal): 10 IMPRESSION: Chronic infarcts in the known from 2019. No acute or interval finding. Electronically Signed   By: Tiburcio Pea M.D.   On: 01/02/2024 07:55    EKG: SR LVH old anterior MI ? Limb lead reversal   ASSESSMENT AND PLAN:   TIA/CVA:  on ASA/Plavix prior stroke 2019 ILR with no PAF. 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 Claustrophobic and unable to do prolonged cardiac MRI. Favor cardiac CTA using TAVR protocol to r/o apical thrombus, assess grafts and LAA as well will try to arrange for today/tomorrow Cr is fine and no contrasc allergy. Permissive HTN, ASA/Plavix and statin resumed. If he has apical thrombus would favor Plavix monoRx with eliquis for 3 months  CAD/CABG:  no chest pain or acute ECG changes. Known prior anterior MI involving septum and apex. Initiate GDMT after issues with thrombus sorted  Ischemic DCM:  EF 45-50% start low dose coreg and ARB after CTA GI:  may benefit from EGD at some point and GI evaluation as he stopped his ASA/Plavix/Statin due to stomach upset and esophagram shows likely stricture and dysmotility On protonix and zofran  Signed: Charlton Haws 01/03/2024, 12:20 PM

## 2024-01-03 NOTE — Plan of Care (Signed)
  Problem: Education: Goal: Knowledge of disease or condition will improve 01/03/2024 0330 by Avie Arenas, RN Outcome: Progressing 01/03/2024 0329 by Avie Arenas, RN Outcome: Progressing   Problem: Coping: Goal: Will verbalize positive feelings about self 01/03/2024 0330 by Avie Arenas, RN Outcome: Progressing 01/03/2024 0329 by Avie Arenas, RN Outcome: Progressing   Problem: Self-Care: Goal: Ability to participate in self-care as condition permits will improve 01/03/2024 0330 by Avie Arenas, RN Outcome: Progressing 01/03/2024 0329 by Avie Arenas, RN Outcome: Progressing   Problem: Nutrition: Goal: Risk of aspiration will decrease 01/03/2024 0330 by Avie Arenas, RN Outcome: Progressing 01/03/2024 0329 by Avie Arenas, RN Outcome: Progressing   Problem: Clinical Measurements: Goal: Ability to maintain clinical measurements within normal limits will improve 01/03/2024 0330 by Avie Arenas, RN Outcome: Progressing 01/03/2024 0329 by Avie Arenas, RN Outcome: Progressing   Problem: Coping: Goal: Level of anxiety will decrease 01/03/2024 0330 by Avie Arenas, RN Outcome: Progressing 01/03/2024 0329 by Avie Arenas, RN Outcome: Progressing   Problem: Safety: Goal: Ability to remain free from injury will improve Outcome: Progressing

## 2024-01-03 NOTE — Evaluation (Signed)
Physical Therapy Evaluation Patient Details Name: Travis [name] MRN: [id number] DOB: September 25, 1971 Today's Date: 01/03/2024  History of Present Illness  53 yo male presenting to ED 1/20 as code stroke after experiencing L sided weakness, facial droop and dysarthria. BP in 130s to 190s systolic, MRI brain performed in confirmed acute right MCA territory infarcts without hemorrhage or mass effect. Echo shows apical LV thrombus.  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 Pt reports independence with mobility in home, uses a cane for short distances in the community and is independent with ADLs and iADLs. Pt reports he feels his L sided weakness is resolved in his arm and leg but that he is still having some slurred speech. Pt has historic peripheral neuropathy and impaired sensation below his L knee. Strength and balance do seem to be less than he describes his baseline. Pt able to don R prosthetic and take short distance ambulation in hallway. Pt benefits from RW for improved balance at this time. PT recommending HHPT at discharge. PT will continue to follow acutely.          If plan is discharge home, recommend the following: A little help with walking and/or transfers;A little help with bathing/dressing/bathroom;Assistance with cooking/housework;Assist for transportation;Help with stairs or ramp for entrance   Can travel by private vehicle    Yes    Equipment Recommendations None recommended by PT     Functional Status Assessment Patient has had a recent decline in their functional status and demonstrates the ability to make significant improvements in function in a reasonable and predictable amount of time.      Precautions / Restrictions Precautions Precautions: Fall Restrictions Weight Bearing Restrictions Per Provider Order: No      Mobility  Bed Mobility Overal bed mobility: Modified Independent             General bed mobility comments: HoB slightly elevated and use of bed rail to pull to EoB    Transfers Overall transfer level: Needs assistance Equipment used: Rolling walker (2 wheels) Transfers: Sit to/from Stand Sit to Stand: Contact guard assist           General transfer comment: good power up and self steady, denies dizziness    Ambulation/Gait Ambulation/Gait assistance: Contact guard assist Gait Distance (Feet): 100 Feet Assistive device: Rolling walker (2 wheels) Gait Pattern/deviations: Decreased dorsiflexion - right, Decreased dorsiflexion - left, Step-through pattern Gait velocity: slowed Gait velocity interpretation: <1.31 ft/sec, indicative of household ambulator   General Gait Details: utilized RW for safety, pt ambulates with increased lateral sway which helps to compensate for decreased L foot dorsiflexion        Balance Overall balance assessment: Needs assistance Sitting-balance support: Feet supported, No upper extremity supported Sitting balance-Leahy Scale: Good     Standing balance support: Bilateral upper extremity supported, During functional activity, Single extremity supported, Reliant on assistive device for balance Standing balance-Leahy Scale: Fair Standing balance comment: benefits from UE support                             Pertinent Vitals/Pain Pain Assessment Pain Assessment: No/denies pain    Home Living Family/patient expects to be discharged to:: Private residence Living Arrangements: Children  Available Help at Discharge: Family;Available 24 hours/day Type of Home: House Home Access: Ramped entrance       Home Layout: One level Home Equipment: Agricultural consultant (2 wheels);Cane - single point;Shower  seat;Hand held shower head;Wheelchair - manual      Prior Function Prior Level of Function : Independent/Modified Independent             Mobility Comments: independent with household distance ambulation, uses cane outside and for very limited community distances ADLs Comments: independent in ADLs, and iADLs, does not drive     Extremity/Trunk Assessment   Upper Extremity Assessment Upper Extremity Assessment: Defer to OT evaluation    Lower Extremity Assessment Lower Extremity Assessment: RLE deficits/detail;LLE deficits/detail RLE Deficits / Details: ROM WFL, hip flex 4/5, knee ext 4/5 knee flex 3+/5, dorsiflex 2/5, plantarflex 3+/5 RLE Sensation: history of peripheral neuropathy (below knee) RLE Coordination: decreased fine motor LLE Deficits / Details: L BKA, hip flex 4/5, knee ext 4/5, knee flex 3+/5 LLE Sensation: history of peripheral neuropathy LLE Coordination: decreased fine motor;decreased gross motor    Cervical / Trunk Assessment Cervical / Trunk Assessment: Kyphotic  Communication   Communication Communication: No apparent difficulties (pt reports he does not feel his speech is back to its normal)  Cognition Arousal: Alert Behavior During Therapy: WFL for tasks assessed/performed Overall Cognitive Status: Within Functional Limits for tasks assessed                                          General Comments General comments (skin integrity, edema, etc.): VSS on RA        Assessment/Plan    PT Assessment Patient needs continued PT services  PT Problem List Decreased activity tolerance;Decreased balance;Decreased mobility;Impaired sensation;Decreased coordination;Decreased range of motion       PT Treatment Interventions DME instruction;Gait training;Functional mobility training;Therapeutic activities;Therapeutic exercise;Balance training;Cognitive remediation;Patient/family education    PT Goals (Current goals can be found in the  Care Plan section)  Acute Rehab PT Goals PT Goal Formulation: With patient/family Time For Goal Achievement: 01/17/24 Potential to Achieve Goals: Fair    Frequency Min 1X/week        AM-PAC PT "6 Clicks" Mobility  Outcome Measure Help needed turning from your back to your side while in a flat bed without using bedrails?: None Help needed moving from lying on your back to sitting on the side of a flat bed without using bedrails?: None Help needed moving to and from a bed to a chair (including a wheelchair)?: None Help needed standing up from a chair using your arms (e.g., wheelchair or bedside chair)?: None Help needed to walk in hospital room?: A Little Help needed climbing 3-5 steps with a railing? : A Lot 6 Click Score: 21    End of Session Equipment Utilized During Treatment: Gait belt Activity Tolerance: Patient tolerated treatment well Patient left: in chair;with chair alarm set;with family/visitor present Nurse Communication: Mobility status PT Visit Diagnosis: Muscle weakness (generalized) (M62.81);Unsteadiness on feet (R26.81);Other abnormalities of gait and mobility (R26.89);Difficulty in walking, not elsewhere classified (R26.2);Other symptoms and signs involving the nervous system (R29.898)    Time: 4098-1191 PT Time Calculation (min) (ACUTE ONLY): 19 min   Charges:   PT Evaluation $PT Eval Moderate Complexity: 1 Mod   PT General Charges $$ ACUTE PT VISIT: 1 Visit         Elon Alas  Fleet PT, DPT Acute Rehabilitation Services Please use secure chat or  Call Office [phone]   Elon Alas [facility] 01/03/2024, 11:05 AM

## 2024-01-03 NOTE — Progress Notes (Addendum)
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  EF actually improved since time around CABG in 2019.   Discussed with neurology Dr. Roda Shutters and family  Will d/c plavix Rx Aspirin 81 mg enteric coated for CAD/grafts Neuro indicates plavix not needed for embolic stroke Start eliquis 5 mg bid Re image in 3 months with Echo/definity or more likely cardiac CTA as it was much easier to visualize on that study than echo and he cannot tolerate MRI No need for heparin if using DOAC  Will start low dose coreg. Would start low dose entresto for decreased EF as an outpatient in 2 weeks or so as to not risk hypotension during acute CVA   Will arrange outpatient f/u with Dr Marguarite Arbour MD [facility]

## 2024-01-03 NOTE — Progress Notes (Addendum)
PROGRESS NOTE    Travis [name]  ZOX:[id number] DOB: 08-24-1971 DOA: 01/02/2024 PCP: Jettie Pagan, NP   Brief Narrative:  HPI: Travis [name] is a 53 y.o. male with medical history significant of hypertension, hyperlipidemia, CVA, GERD, diabetes, gastroparesis, neuropathy, PAD, CHF, BKA, depression presenting with left-sided deficits as a code stroke.   Patient went to bed on the left or midnight last night and woke up around 5 AM with dysarthria, left-sided weakness, left-sided facial droop.  Presented to the ED following this.  Deficits has persisted in the ED.   Denies fevers, chills, chest pain, shortness of breath, abdominal pain, constipation, diarrhea, nausea, vomiting.   Has history of prior CVA that was believed to be embolic but no source identified in 2019.  Has residual left-sided upper quadrantanopia.  Has not been taking his aspirin, Plavix, atorvastatin in the setting of it upsetting his stomach.   ED Course: Vital signs in the ED notable for blood pressure in the 130s to 190 systolic.  Lab workup included CMP with sodium 131 which improved to 132/133 considering glucose of 227, calcium 8.8, T. bili 1.6.  CBC with mild leukocytosis to 11.1.  PT, PTT, INR within normal limits.  Ethanol level negative.  A1c 7.7.  Lipid panel pending.  CT head showed chronic infarcts but no acute abnormality.  CTA of the head and neck showed occluded right M2 with distal reconstitution, short segment occlusion at right vertebral origin, and intracranial atherosclerosis including moderate right V4 and severe left P3 stenosis.  MRI brain performed in confirmed acute right MCA territory infarcts without hemorrhage or mass effect.  Echocardiogram already been performed which showed suspected apical LV thrombus.  Neurology consulted and are following.  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 > History of prior embolic stroke in 2019 with residual left upper field visual deficits. > Has been not been taking aspirin, Plavix, atorvastatin recently due to it making his stomach feel bad > Workup in the ED thus far has confirmed acute right MCA territory infarcts on MRI.  CTA head and neck with right M2 occlusion, right vertebral artery occlusion at origin, intracranial atherosclerosis with right V4 and left P3 stenosis. > Echocardiogram has already been performed and is concerning for possible small apical thrombus of the left ventricle.  Neurology recommended TEE, cardiology consulted however previous history of esophageal stricture so patient underwent esophagogram which does show some narrowing of the lower part of the esophagus.  Cardiology is following, they are not planning to do TEE, they recommended cardiac MRI but patient claustrophobic so they have now recommended and ordered cardiac CTA using TAVR protocol to rule out apical thrombus, assess grafts and LAA.  Patient is speech to me appears to be fluent however patient's sister was at the bedside says that the speech is not clear yet.  Patient has no other residual symptoms at the moment.  Neurology following as well.  Patient has been started on aspirin and Plavix.  Per cardiology, if he is to be followed up with a pedicle thrombus then they would recommend Plavix and then  along with Eliquis for 3 months.  Addendum/update 5:10 PM: Per Dr. Cyndie Chime from cardiology, CT shows no LAA thormbus and LV apical thrombus 1.7 cm x 1.1 cm. CABG Grafts are patent, he recommended Rx with eliquis 5 mg bid for 3 months and then rescan in 3 months, if thrombus is gone then stop Eliquis but if thrombus present and continue treatment.  Neurology recommended continuing aspirin and discontinuing Plavix.  Aspirin will be indefinitely.  Orders placed.   Hypertension -PTA on Lasix, Toprol-XL, ramipril.  Allowing permissive hypertension and holding all antihypertensives.   Hyperlipidemia -LDL 105, continue home rosuvastatin   GERD - Continue home PPI   Diabetes melitis type II, neuropathy, diabetic gastroparesis > Appears to be related Lantus 80 units in the morning along with 3 times daily Premeal Humalog and Reglan.  Patient was kept n.p.o. from midnight so he did not receive any long-acting insulin yesterday.  He is hyperglycemic however he started on regular diet, I will switch him to diabetic diet, start him on sliding scale insulin as well as Semglee 20 units.  Resume Reglan as well.   PAD - Continue aspirin, Plavix, rosuvastatin   Chronic systolic CHF > Echo this admission showed EF 45.50%, indeterminate diastolic function, normal RV function.  And above-mentioned suspected small LV apical thrombus. Plan as above.  Management per cardiology.  DVT prophylaxis: enoxaparin (LOVENOX) injection 40 mg Start: 01/02/24 1400   Code Status: Full Code  Family Communication: Sister present at bedside.  Plan of care discussed with patient in length and he/she verbalized understanding and agreed with it.  Status is: Observation The patient will require care spanning > 2 midnights and should be moved to inpatient because: Needs more workup.   Estimated body mass index is 35.38 kg/m as calculated from the following:   Height as of this encounter: 6\' 2"  (1.88 m).   Weight as of this  encounter: 125 kg.    Nutritional Assessment: Body mass index is 35.38 kg/m.Marland Kitchen Seen by dietician.  I agree with the assessment and plan as outlined below: Nutrition Status:        . Skin Assessment: I have examined the patient's skin and I agree with the wound assessment as performed by the wound care RN as outlined below:    Consultants:  Neurology and cardiology  Procedures:  None  Antimicrobials:  Anti-infectives (From admission, onward)    None         Subjective: Patient seen and examined.  He had no complaints.  Sister at the bedside believes that although his speech is better than yesterday but not back to baseline however to me, he is fluent.  He himself did not have any concerns about his speech.  Objective: Vitals:   01/03/24 0000 01/03/24 0400 01/03/24 0807 01/03/24 1127  BP: (!) 152/71 (!) 144/63 (!) 141/75 (!) 157/80  Pulse: 76 73 79 82  Resp: 18 16    Temp: (!) 97.5 F (36.4 C) 98.6 F (37 C) 98.1 F (36.7 C) 97.7 F (36.5 C)  TempSrc: Oral Oral Oral Oral  SpO2: 99% 99% 99% 96%  Weight:      Height:        Intake/Output Summary (Last 24 hours) at 01/03/2024  1356 Last data filed at 01/02/2024 2036 Gross per 24 hour  Intake 100 ml  Output --  Net 100 ml   Filed Weights   01/02/24 0700  Weight: 125 kg    Examination:  General exam: Appears calm and comfortable  Respiratory system: Clear to auscultation. Respiratory effort normal. Cardiovascular system: S1 & S2 heard, RRR. No JVD, murmurs, rubs, gallops or clicks. No pedal edema. Gastrointestinal system: Abdomen is nondistended, soft and nontender. No organomegaly or masses felt. Normal bowel sounds heard. Central nervous system: Alert and oriented. No focal neurological deficits. Extremities: Left BKA. Skin: No rashes, lesions or ulcers   Data Reviewed: I have personally reviewed following labs and imaging studies  CBC: Recent Labs  Lab 01/02/24 0738 01/02/24 0742 01/03/24 0433   WBC 11.1*  --  12.5*  NEUTROABS 8.3*  --   --   HGB 14.6 15.0 15.2  HCT 43.1 44.0 45.0  MCV 89.0  --  88.4  PLT 264  --  328   Basic Metabolic Panel: Recent Labs  Lab 01/02/24 0738 01/02/24 0742 01/03/24 0433  NA 131* 135 135  K 4.4 4.4 3.8  CL 99 102 101  CO2 22  --  26  GLUCOSE 227* 233* 213*  BUN 13 16 8   CREATININE 0.90 0.90 1.13  CALCIUM 8.8*  --  8.8*   GFR: Estimated Creatinine Clearance: 107.4 mL/min (by C-G formula based on SCr of 1.13 mg/dL). Liver Function Tests: Recent Labs  Lab 01/02/24 0738 01/03/24 0433  AST 21 16  ALT 9 11  ALKPHOS 57 63  BILITOT 1.6* 1.7*  PROT 6.5 7.1  ALBUMIN 3.5 3.7   No results for input(s): "LIPASE", "AMYLASE" in the last 168 hours. No results for input(s): "AMMONIA" in the last 168 hours. Coagulation Profile: Recent Labs  Lab 01/02/24 0738  INR 1.1   Cardiac Enzymes: No results for input(s): "CKTOTAL", "CKMB", "CKMBINDEX", "TROPONINI" in the last 168 hours. BNP (last 3 results) No results for input(s): "PROBNP" in the last 8760 hours. HbA1C: Recent Labs    01/02/24 0738  HGBA1C 7.7*   CBG: Recent Labs  Lab 01/02/24 0735 01/03/24 0010 01/03/24 0502 01/03/24 0809 01/03/24 1125  GLUCAP 229* 220* 192* 164* 202*   Lipid Profile: Recent Labs    01/03/24 0433  CHOL 162  HDL 25*  LDLCALC 105*  TRIG 162*  CHOLHDL 6.5   Thyroid Function Tests: No results for input(s): "TSH", "T4TOTAL", "FREET4", "T3FREE", "THYROIDAB" in the last 72 hours. Anemia Panel: No results for input(s): "VITAMINB12", "FOLATE", "FERRITIN", "TIBC", "IRON", "RETICCTPCT" in the last 72 hours. Sepsis Labs: No results for input(s): "PROCALCITON", "LATICACIDVEN" in the last 168 hours.  No results found for this or any previous visit (from the past 240 hours).   Radiology Studies: DG ESOPHAGUS W SINGLE CM (SOL OR THIN BA) Result Date: 01/03/2024 CLINICAL DATA:  Patient admitted with CVA, concern for apical thrombus on TTE - unable to  undergo TEE due to previously reported esophageal stricture on esophagram in 2021 which per patient has not been treated. Request for esophagram to evaluate previous stricture. Patient denies any difficulty swallowing, his only complaint is reflux. EXAM: ESOPHAGUS/BARIUM SWALLOW/TABLET STUDY TECHNIQUE: Single contrast examination was performed using thin liquid barium. This exam was performed by Lynnette Caffey, PA-C, and was supervised and interpreted by Marliss Coots, MD. FLUOROSCOPY: Radiation Exposure Index (as provided by the fluoroscopic device): 91.00 mGy Kerma COMPARISON:  Esophagram 12/02/20. FINDINGS: Swallowing: Appears normal. No vestibular penetration or aspiration  seen on problem focused exam. Pharynx: Unremarkable. Esophagus: Normal appearance. Smooth, uniform narrowing at the gastroesophageal junction which appears to be more distal and not as significant or well defined as the stricture seen on previous esophagram. This may be due to the inability of the patient to tolerate double contrast exam today. Esophageal motility: At least moderate dysmotility with tertiary contractions seen in the lower esophagus. Motility exam is limited due to patient inability to lay prone. Hiatal Hernia: None. Gastroesophageal reflux: None visualized on limited exam. Ingested 13 mm barium tablet: Became stuck just proximal to the at the gastroesophageal junction for approximately 3 minutes before passing into the stomach after additional sips of water and thin barium. Other: Patient unable to stand due to BKA and weakness - entire exam performed in supine AP and LPO with exam table tilted to 30 degrees. He is unable to tolerate gas crystals due to stomach pain per his report. IMPRESSION: Single contrast esophagram significant for: 1. Smooth, uniform narrowing at the gastroesophageal junction. This does not appear to directly correlate to previously seen stricture on 2021 esophagram, however this may be due to  difference in exam technique as patient is unable to tolerate double contrast exam today. Consider EGD for further evaluation. 2. 13 mm tablet became stuck at just proximal to the gastroesophageal junction for approximately 3 minutes before passing into the stomach. This area roughly correlates to previously seen stricture. 3.  At least moderate esophageal dysmotility Electronically Signed   By: Marliss Coots M.D.   On: 01/03/2024 10:19   ECHOCARDIOGRAM COMPLETE Result Date: 01/02/2024    ECHOCARDIOGRAM REPORT   Patient Name:   JAQUIS DUPREE Date of Exam: 01/02/2024 Medical Rec #:  [id number]        Height:       74.0 in Accession #:    [id number]       Weight:       275.6 lb Date of Birth:  05/27/1971         BSA:          2.491 m Patient Age:    52 years         BP:           163/70 mmHg Patient Gender: M                HR:           80 bpm. Exam Location:  Inpatient Procedure: 2D Echo, Cardiac Doppler, Color Doppler and Intracardiac            Opacification Agent Indications:    Stroke  History:        Patient has prior history of Echocardiogram examinations, most                 recent 06/25/2018. CAD; Risk Factors:Diabetes and Hypertension.  Sonographer:    Vern Claude Referring Phys: [id number] DEVON SHAFER  Sonographer Comments: Technically difficult study due to poor echo windows and suboptimal subcostal window. IMPRESSIONS  1. Possible small LV apical thrombus (clip 78-79). Image quality is suboptimal. In setting of stroke, consider cardiac MRI to further assess.  2. Left ventricular ejection fraction, by estimation, is 45 to 50%. The left ventricle has mildly decreased function. Left ventricular endocardial border not optimally defined to evaluate regional wall motion. There is mild left ventricular hypertrophy.  Indeterminate diastolic filling due to E-A fusion. There is abnormal septal motion.  3. Right ventricular systolic function is normal. The right ventricular size  is normal. Tricuspid regurgitation  signal is inadequate for assessing PA pressure.  4. The mitral valve is grossly normal. Trivial mitral valve regurgitation.  5. The aortic valve was not well visualized. Aortic valve regurgitation is not visualized. No aortic stenosis is present.  6. The inferior vena cava is normal in size with <50% respiratory variability, suggesting right atrial pressure of 8 mmHg. FINDINGS  Left Ventricle: Left ventricular ejection fraction, by estimation, is 45 to 50%. The left ventricle has mildly decreased function. Left ventricular endocardial border not optimally defined to evaluate regional wall motion. The left ventricular internal cavity size was normal in size. There is mild left ventricular hypertrophy. Abnormal septal motion. Indeterminate diastolic filling due to E-A fusion. Right Ventricle: The right ventricular size is normal. Right vetricular wall thickness was not well visualized. Right ventricular systolic function is normal. Tricuspid regurgitation signal is inadequate for assessing PA pressure. Left Atrium: Left atrial size was normal in size. Right Atrium: Right atrial size was normal in size. Pericardium: There is no evidence of pericardial effusion. Mitral Valve: The mitral valve is grossly normal. Trivial mitral valve regurgitation. MV peak gradient, 2.8 mmHg. The mean mitral valve gradient is 2.0 mmHg. Tricuspid Valve: The tricuspid valve is not well visualized. Tricuspid valve regurgitation is trivial. Aortic Valve: The aortic valve was not well visualized. Aortic valve regurgitation is not visualized. No aortic stenosis is present. Aortic valve mean gradient measures 3.0 mmHg. Aortic valve peak gradient measures 4.2 mmHg. Aortic valve area, by VTI measures 3.08 cm. Pulmonic Valve: The pulmonic valve was not well visualized. Pulmonic valve regurgitation is trivial. Aorta: The aortic root is normal in size and structure. Venous: The inferior vena cava was not well visualized. The inferior vena cava is  normal in size with less than 50% respiratory variability, suggesting right atrial pressure of 8 mmHg. IAS/Shunts: The interatrial septum was not well visualized.  LEFT VENTRICLE PLAX 2D LVIDd:         5.10 cm      Diastology LVIDs:         3.90 cm      LV e' medial:    6.64 cm/s LV PW:         1.10 cm      LV E/e' medial:  13.2 LV IVS:        0.80 cm      LV e' lateral:   8.70 cm/s LVOT diam:     2.20 cm      LV E/e' lateral: 10.1 LV SV:         75 LV SV Index:   30 LVOT Area:     3.80 cm  LV Volumes (MOD) LV vol d, MOD A4C: 199.0 ml LV vol s, MOD A4C: 109.0 ml LV SV MOD A4C:     199.0 ml RIGHT VENTRICLE            IVC RV Basal diam:  3.60 cm    IVC diam: 1.50 cm RV S prime:     6.74 cm/s TAPSE (M-mode): 1.7 cm LEFT ATRIUM             Index        RIGHT ATRIUM           Index LA diam:        4.00 cm 1.61 cm/m   RA Area:     16.50 cm LA Vol (A2C):   74.3 ml 29.83 ml/m  RA Volume:   41.40 ml  16.62 ml/m LA Vol (A4C):   73.1 ml 29.35 ml/m LA Biplane Vol: 75.7 ml 30.39 ml/m  AORTIC VALVE                    PULMONIC VALVE AV Area (Vmax):    3.30 cm     PV Vmax:       0.86 m/s AV Area (Vmean):   2.96 cm     PV Peak grad:  3.0 mmHg AV Area (VTI):     3.08 cm AV Vmax:           103.00 cm/s AV Vmean:          82.200 cm/s AV VTI:            0.242 m AV Peak Grad:      4.2 mmHg AV Mean Grad:      3.0 mmHg LVOT Vmax:         89.40 cm/s LVOT Vmean:        64.100 cm/s LVOT VTI:          0.196 m LVOT/AV VTI ratio: 0.81  AORTA Ao Root diam: 3.10 cm Ao Asc diam:  3.00 cm MITRAL VALVE MV Area (PHT): 4.60 cm    SHUNTS MV Area VTI:   4.46 cm    Systemic VTI:  0.20 m MV Peak grad:  2.8 mmHg    Systemic Diam: 2.20 cm MV Mean grad:  2.0 mmHg MV Vmax:       0.83 m/s MV Vmean:      65.1 cm/s MV Decel Time: 165 msec MV E velocity: 87.50 cm/s MV A velocity: 78.60 cm/s MV E/A ratio:  1.11 Weston Brass MD Electronically signed by Weston Brass MD Signature Date/Time: 01/02/2024/11:57:47 AM    Final    MR BRAIN WO  CONTRAST Result Date: 01/02/2024 CLINICAL DATA:  Stroke, follow up EXAM: MRI HEAD WITHOUT CONTRAST TECHNIQUE: Multiplanar, multiecho pulse sequences of the brain and surrounding structures were obtained without intravenous contrast. COMPARISON:  Head CT 01/02/2024 FINDINGS: Brain: Acute infarcts in the right MCA territory involving the body of the caudate on the right, right insular cortex, right frontal lobe. No hemorrhage. No hydrocephalus. No extra-axial fluid collection. Chronic left thalamic and right occipital no mass effect. No mass lesion. Vascular: Normal flow voids. Skull and upper cervical spine: Normal marrow signal. Sinuses/Orbits: Trace left mastoid effusion. No middle ear effusion. Paranasal sinuses are clear. Left lens replacement. Orbits are otherwise unremarkable. Other: None. IMPRESSION: Acute infarcts in the right MCA territory involving the body of the caudate on the right, right insular cortex, and right frontal lobe. No hemorrhage or mass effect. Electronically Signed   By: Lorenza Cambridge M.D.   On: 01/02/2024 10:16   CT ANGIO HEAD NECK W WO CM (CODE STROKE) Result Date: 01/02/2024 CLINICAL DATA:  Neuro deficit, acute, stroke suspected. Slurred speech. EXAM: CT ANGIOGRAPHY HEAD AND NECK WITH AND WITHOUT CONTRAST TECHNIQUE: Multidetector CT imaging of the head and neck was performed using the standard protocol during bolus administration of intravenous contrast. Multiplanar CT image reconstructions and MIPs were obtained to evaluate the vascular anatomy. Carotid stenosis measurements (when applicable) are obtained utilizing NASCET criteria, using the distal internal carotid diameter as the denominator. RADIATION DOSE REDUCTION: This exam was performed according to the departmental dose-optimization program which includes automated exposure control, adjustment of the mA and/or kV according to patient size and/or use of iterative reconstruction technique. CONTRAST:  75mL OMNIPAQUE IOHEXOL 350  MG/ML SOLN  COMPARISON:  CTA head and neck 06/25/2018 FINDINGS: CTA NECK FINDINGS Aortic arch: Normal variant aortic arch branching pattern with common origin of the brachiocephalic and left common carotid arteries. Widely patent arch vessel origins. Right carotid system: Patent without evidence of stenosis or dissection. Left carotid system: Patent without evidence of stenosis or dissection. Vertebral arteries: New short segment occlusion of the right vertebral artery at its origin over an approximately 1 cm long segment with the vessel being patent throughout the remainder of the neck. Patent left vertebral artery with suboptimal assessment of the proximal V1 segment but no evidence of a significant stenosis more distally. Skeleton: Mild cervical spondylosis. Other neck: No evidence of cervical lymphadenopathy or mass. Upper chest: Clear lung apices. Review of the MIP images confirms the above findings CTA HEAD FINDINGS Anterior circulation: The internal carotid arteries are patent from skull base to carotid termini with mild atherosclerosis not resulting in a significant stenosis. ACAs and MCAs are patent proximally without evidence of a significant A1 or M1 stenosis. There is occlusion of a mid right M2 branch vessel with distal reconstitution. No aneurysm is identified. Posterior circulation: The intracranial vertebral arteries are patent to the basilar with moderate multifocal narrowing of the right V4 segment. Patent PICA and SCA origins are visualized bilaterally. The basilar artery is widely patent. There are small right and large left posterior communicating arteries. The PCAs are patent with branch vessel irregularity, including a severe proximal left P3 stenosis with diminished opacification distally. No aneurysm is identified. Venous sinuses: Patent. Anatomic variants: None. Review of the MIP images confirms the above findings These results were communicated to Dr. Iver Nestle at 8:42 am on 01/02/2024 by text  page via the [facility] messaging system. IMPRESSION: 1. Occlusion of a mid right M2 branch vessel with distal reconstitution. 2. Short segment occlusion of the right vertebral artery at its origin. 3. Intracranial atherosclerosis including moderate right V4 and severe left P3 stenoses. Electronically Signed   By: Sebastian Ache M.D.   On: 01/02/2024 08:53   CT HEAD CODE STROKE WO CONTRAST Result Date: 01/02/2024 CLINICAL DATA:  Code stroke.  Slurred speech EXAM: CT HEAD WITHOUT CONTRAST TECHNIQUE: Contiguous axial images were obtained from the base of the skull through the vertex without intravenous contrast. RADIATION DOSE REDUCTION: This exam was performed according to the departmental dose-optimization program which includes automated exposure control, adjustment of the mA and/or kV according to patient size and/or use of iterative reconstruction technique. COMPARISON:  06/26/2018 brain MRI FINDINGS: Brain: No evidence of acute infarction, hemorrhage, hydrocephalus, extra-axial collection or mass lesion/mass effect. 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 Vascular: No hyperdense vessel. Premature atheromatous calcification. Skull: Normal. Negative for fracture or focal lesion. Sinuses/Orbits: No acute finding Other: Prelim sent to neurology in epic CT. ASPECTS ([place] Stroke Program Early CT Score) - Ganglionic level infarction (caudate, lentiform nuclei, internal capsule, insula, M1-M3 cortex): 7 - Supraganglionic infarction (M4-M6 cortex): 3 Total score (0-10 with 10 being normal): 10 IMPRESSION: Chronic infarcts in the known from 2019. No acute or interval finding. Electronically Signed   By: Tiburcio Pea M.D.   On: 01/02/2024 07:55    Scheduled Meds:  aspirin EC  81 mg Oral Daily   clopidogrel  75 mg Oral Daily   enoxaparin (LOVENOX) injection  40 mg Subcutaneous Daily   insulin aspart  0-15 Units Subcutaneous TID WC   insulin  aspart  0-5 Units Subcutaneous QHS  pantoprazole  40 mg Oral Daily   rosuvastatin  40 mg Oral Daily   Continuous Infusions:   LOS: 0 days   Hughie Closs, MD [facility]  01/03/2024, 1:56 PM   *Please note that this is a verbal dictation therefore any spelling or grammatical errors are due to the "[employer]" system interpretation.  Please page via Amion and do not message via secure chat for urgent patient care matters. Secure chat can be used for non urgent patient care matters.  How to contact the [facility] Attending or Consulting provider 7A - 7P or covering provider during after hours 7P -7A, for this patient?  Check the care team in [facility] and look for a) attending/consulting [facility] provider listed and b) the [facility] team listed. Page or secure chat [phone]. Log into [email or web address] and use [facility]'s universal password to access. If you do not have the password, please contact the hospital operator. Locate the [facility] provider you are looking for under [facility] and page to a number that you can be directly reached. If you still have difficulty reaching the provider, please page the [facility] (Director on Call) for the Hospitalists listed on amion for assistance.

## 2024-01-03 NOTE — TOC Transition Note (Signed)
Transition of Care [facility]) - Discharge Note   Patient Details  Name: Travis [name] MRN: [id number] Date of Birth: 12-13-1971  Transition of Care [facility]) CM/SW Contact:  Kermit Balo, RN Phone Number: 01/03/2024, 11:55 AM   Clinical Narrative:     Patient is from home with his son, daughter and daughters boyfriend. Pt states some one is with him all the time. DME at home: walker/ cane/ wheelchair/ shower seat/ prosthesis Pt manages his own medications and does admit to non-compliance. Pt states he is going to do much better after d/c.  Pts family provides needed transportation.  Home health therapy recommended. Pt prefers outpatient therapy. Referral sent to [facility]. Pt will call to schedule the first appointment. Pt has transportation home.  Final next level of care: OP Rehab Barriers to Discharge: No Barriers Identified   Patient Goals and CMS Choice     Choice offered to / list presented to : Patient      Discharge Placement                       Discharge Plan and Services Additional resources added to the After Visit Summary for                                       Social Drivers of Health (SDOH) Interventions SDOH Screenings   Food Insecurity: No Food Insecurity (01/02/2024)  Housing: Low Risk  (01/02/2024)  Transportation Needs: No Transportation Needs (01/02/2024)  Utilities: Not At Risk (01/02/2024)  Depression (PHQ2-9): Low Risk  (02/14/2019)  Financial Resource Strain: High Risk (06/13/2023)   Received from [facility]  Physical Activity: Unknown (06/13/2023)   Received from [facility]  Social Connections: Socially Isolated (06/13/2023)   Received from [facility]  Stress: Stress Concern Present (06/13/2023)   Received from [facility]  Tobacco Use: Low Risk  (01/02/2024)     Readmission Risk Interventions     No data to display

## 2024-01-03 NOTE — Progress Notes (Signed)
OT Cancellation Note  Patient Details Name: JEMERY UMSTEAD MRN: [id number] DOB: 08/01/1971   Cancelled Treatment:    Reason Eval/Treat Not Completed: Patient at procedure or test/ unavailable (cardiac MRI - OT evaluation to f/u when able)  Donia Pounds 01/03/2024, 9:54 AM

## 2024-01-03 NOTE — Evaluation (Signed)
Speech Language Pathology Evaluation Patient Details Name: Travis [name] MRN: [id number] DOB: [birth year] Today's Date: 01/03/2024 Time: 2841-3244 SLP Time Calculation (min) (ACUTE ONLY): 10 min  Problem List:  Patient Active Problem List   Diagnosis Date Noted   Acute ischemic stroke (HCC) 01/03/2024   Acute CVA (cerebrovascular accident) (HCC) 01/02/2024   Degenerative arthritis of right wrist 01/28/2022   Distal radius fracture, right 01/28/2022   H/O amputation of lesser toe, left (HCC) 04/04/2019   Foreign body (FB) in soft tissue    PVD (peripheral vascular disease) (HCC) 02/14/2019   Snoring 02/14/2019   S/P laparoscopic cholecystectomy 01/04/2019   Cholecystostomy care (HCC) 09/05/2018   Essential hypertension 06/27/2018   Hyperlipidemia 06/27/2018   Diabetic retinopathy (HCC) 06/27/2018   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, unknown embolic source 06/25/2018   Left arm numbness 03/03/2018   Strain of right trapezius muscle 03/03/2018   Ischemic cardiomyopathy 12/16/2017   Major depressive disorder, recurrent episode, mild (HCC) 09/16/2017   Acute on chronic systolic and diastolic heart failure, NYHA class 3 (HCC) 09/14/2017   S/P BKA (below knee amputation) unilateral, left (HCC) 11/19/2016   Short Achilles tendon (acquired), left ankle 11/19/2016   Gastroparesis due to DM (HCC)    History of diabetic ulcer of foot 11/14/2015   Vitamin D deficiency 11/12/2015   Osteoporosis 11/12/2015   GERD (gastroesophageal reflux disease) 11/03/2015   Erectile dysfunction 07/11/2009   Allergic rhinitis 12/11/2008   Diabetic peripheral neuropathy associated with type 2 diabetes mellitus (HCC) 08/30/2008   Insomnia 08/24/2007   Diabetes (HCC) 08/09/2007   Past Medical History:  Past Medical History:  Diagnosis Date   Acute on chronic systolic and diastolic heart failure, NYHA class 3 (HCC) 09/14/2017   Acute osteomyelitis of metatarsal bone of left foot (HCC)  11/12/2015   CAD in native artery 09/14/2017   Closed fracture of right tibial plateau 11/11/2015   Coronary artery disease    Dehiscence of amputation stump (HCC)    left leg   Depression with anxiety    Diabetes mellitus    INSULIN DEPENDENT Type II   Diabetic peripheral neuropathy associated with type 2 diabetes mellitus (HCC) 08/30/2008   Qualifier: Diagnosis of  By: Daphine Deutscher FNP, [place]     Diabetic ulcer of left foot (HCC) 11/14/2015   Gastroparesis    GERD (gastroesophageal reflux disease)    History of loop recorder 06/2018   Last remote device check was on 02/14/2019   Hypertension    patient denies   Left ventricular aneurysm    REPORTS HE IS NOT AWAREW OF THIS    Myocardial infarction (HCC)    Osteomyelitis (HCC)    Osteoporosis 11/12/2015   Peripheral neuropathy    feet and below bilateral knees   Peripheral vascular disease (HCC)    PAD in Left leg and some in right leg   Pressure injury of left foot, stage 2 (HCC) 02/14/2019   Sepsis (HCC) 10/29/2018   Shortness of breath dyspnea    Stroke (HCC) 05/2018   Subacute osteomyelitis, left ankle and foot (HCC) 10/20/2016   Vitamin D deficiency 11/12/2015   Past Surgical History:  Past Surgical History:  Procedure Laterality Date   AMPUTATION Left 11/28/2015   Procedure: Left 4th Ray Amputation;  Surgeon: Nadara Mustard, MD;  Location: [facility] OR;  Service: Orthopedics;  Laterality: Left;   AMPUTATION Left 10/29/2016   Procedure: LEFT 5TH RAY AMPUTATION;  Surgeon: Nadara Mustard, MD;  Location:  [facility] OR;  Service: Orthopedics;  Laterality: Left;   AMPUTATION Left 03/09/2019   Procedure: LEFT FOOT 5TH RAY AMPUTATION;  Surgeon: Nadara Mustard, MD;  Location: [facility] OR;  Service: Orthopedics;  Laterality: Left;   AMPUTATION Left 04/18/2019   Procedure: LEFT BELOW KNEE AMPUTATION;  Surgeon: Nadara Mustard, MD;  Location: [facility] OR;  Service: Orthopedics;  Laterality: Left;   ANTERIOR VITRECTOMY Left 04/12/2016   Procedure: ANTERIOR CHAMBER  WASH OUT WITH GAS FLUID EXCHANGE;  Surgeon: Carmela Rima, MD;  Location: [facility] OR;  Service: Ophthalmology;  Laterality: Left;   CARDIAC CATHETERIZATION  10/2017   CATARACT EXTRACTION Left    CHOLECYSTECTOMY N/A 01/04/2019   Procedure: LAPAROSCOPIC CHOLECYSTECTOMY;  Surgeon: Gaynelle Adu, MD;  Location: [facility] ORS;  Service: General;  Laterality: N/A;   CORONARY ARTERY BYPASS GRAFT N/A 11/29/2017    LIMA-LAD, SVG-PDA, SVG-CFX  Procedure: CORONARY ARTERY BYPASS GRAFTING (CABG) times three using the left saphaneous vien. harvested endoscopicly and left internal mammary artery.;  Surgeon: Kerin Perna, MD;  Location: [facility] OR;  Service: Open Heart Surgery;  Laterality: N/A;   EYE SURGERY     left eye surgery 04/2016   IR CHOLANGIOGRAM EXISTING TUBE  10/17/2018   IR CHOLANGIOGRAM EXISTING TUBE  10/30/2018   IR PERC CHOLECYSTOSTOMY  09/05/2018   KNEE SURGERY Left    LOOP RECORDER INSERTION N/A 06/28/2018   Procedure: LOOP RECORDER INSERTION;  Surgeon: Duke Salvia, MD;  Location: [facility] INVASIVE CV LAB;  Service: Cardiovascular;  Laterality: N/A;   RIGHT/LEFT HEART CATH AND CORONARY ANGIOGRAPHY N/A 10/19/2017   Procedure: RIGHT/LEFT HEART CATH AND CORONARY ANGIOGRAPHY;  Surgeon: Swaziland, [name], MD;  Location: [facility] INVASIVE CV LAB;  Service: Cardiovascular;  Laterality: N/A;   SHOULDER SURGERY Right    TEE WITHOUT CARDIOVERSION N/A 11/29/2017   Procedure: TRANSESOPHAGEAL ECHOCARDIOGRAM (TEE);  Surgeon: Donata Clay, Theron Arista, MD;  Location: [facility] OR;  Service: Open Heart Surgery;  Laterality: N/A;   TEE WITHOUT CARDIOVERSION N/A 06/28/2018   Procedure: TRANSESOPHAGEAL ECHOCARDIOGRAM (TEE);  Surgeon: Jake Bathe, MD;  Location: [facility] ENDOSCOPY;  Service: Cardiovascular;  Laterality: N/A;   TOE AMPUTATION Left 11/28/15   4th toe   HPI:  Patient is a 53 y.o. male with PMH: HTN, HLD, CVA, depression, GERD, DM, gastroparesis, neuropathy, PAD, CHF. he presented to the hospital on 01/02/24 with left sided deficits. CT head  showed chronic infarcts but no acute abnormality. MRI brain showed acute right MCA territory infarcts without hemorrhage or mass effect.   Assessment / Plan / Recommendation Clinical Impression  Patient is presenting at or very near baseline for cognition and speech intelligibility. He was oriented x4, able to recall and recount recent medical interventions, testing, therapeutic interventions. He recallled 3 of 4 words after 5 minute delay without cues. Family who was present denied any significant changes in his cognition. Patient and family both report that his speech is not as clear as it was previously but his daughter reports it is significantly better than previous date. SLP observed a mild dysarthria which did not significantly impact his overall speech intelligibility and would not be highly perceptible to an untrained listener. SLP not recommending skilled intervention at this time, recommending patient focus on not speaking at too fast a rate and for him to monitor his speech in the coming days, weeks. If he feels his speech is not improving, he can seek out SLP evaluation from his PCP.    SLP Assessment  SLP Recommendation/Assessment: Patient does not  need any further Speech Lanaguage Pathology Services SLP Visit Diagnosis: Dysarthria and anarthria (R47.1);Cognitive communication deficit (R41.841)    Recommendations for follow up therapy are one component of a multi-disciplinary discharge planning process, led by the attending physician.  Recommendations may be updated based on patient status, additional functional criteria and insurance authorization.    Follow Up Recommendations  No SLP follow up    Assistance Recommended at Discharge  Set up Supervision/Assistance  Functional Status Assessment Patient has had a recent decline in their functional status and demonstrates the ability to make significant improvements in function in a reasonable and predictable amount of time.  Frequency  and Duration           SLP Evaluation Cognition  Overall Cognitive Status: History of cognitive impairments - at baseline Arousal/Alertness: Awake/alert Orientation Level: Oriented X4 Year: 2025 Month: January Day of Week: Correct Attention: Sustained Sustained Attention: Appears intact Memory: Appears intact Awareness: Appears intact Problem Solving: Appears intact       Comprehension  Auditory Comprehension Overall Auditory Comprehension: Appears within functional limits for tasks assessed    Expression Expression Primary Mode of Expression: Verbal Verbal Expression Overall Verbal Expression: Appears within functional limits for tasks assessed   Oral / Motor  Oral Motor/Sensory Function Overall Oral Motor/Sensory Function: Mild impairment Motor Speech Overall Motor Speech: Impaired Respiration: Within functional limits Phonation: Normal Resonance: Within functional limits Articulation: Impaired Level of Impairment: Phrase Intelligibility: Intelligibility reduced Word: 75-100% accurate Phrase: 75-100% accurate Sentence: 75-100% accurate Conversation: 75-100% accurate Interfering Components: Premorbid status Effective Techniques: Slow rate           Angela Nevin, MA, CCC-SLP Speech Therapy

## 2024-01-03 NOTE — Plan of Care (Signed)

## 2024-01-03 NOTE — Progress Notes (Signed)
   Patient was admitted with acute CVA. TTE showed LVEF of 45-50% with a possible small LV apical thrombus but image quality supoptimal. Cardiology was initially asked to help arrange TEE for confirmation and this was tentatively arranged for today. However, upon further review of his chart, it looks like he had a pretty significant esophageal stricture in 11/2020 which he never had dilated. He is scheduled to have a repeat esophagram today.   Reviewed TTE images with Dr. Anne Fu who recommend cardiac MRI over TEE for further assessment of possible apical thrombus. Therefore, TEE has been cancelled for today and cardiac MRI has been ordered.  Corrin Parker, PA-C 01/03/2024 6:34 AM

## 2024-01-03 NOTE — Evaluation (Signed)
Occupational Therapy Evaluation Patient Details Name: Travis [name] MRN: [id number] DOB: 06/07/1971 Today's Date: 01/03/2024   History of Present Illness 53 yo male presenting to ED 1/20 as code stroke after experiencing L sided weakness, facial droop and dysarthria. BP in 130s to 190s systolic, MRI brain performed in confirmed acute right MCA territory infarcts without hemorrhage or mass effect. Echo shows apical LV thrombus.  PMH: L BKA, CAD, MI, CAGB 11/29/17, ischemic cardiomyopathy, chronic CHF, right tibial plateau fx 2016, DM2, peripheral neuropathy, HTN, osteoporosis, PVD, stroke,  hyperlipidemia, gastroparesis, depression   Clinical Impression   Pt evaluated s/p HPI listed above. Overall, pt physical deficits have resolved and is performing ADLs and functional mobility tasks at baseline. Pt performed functional mobility and sit to stand transfers from recliner and commode with generalized supervision without AD. Pt able to perform ADLs with MOD I to generalized supervision for safety. Pt reported continued deficits with slowed, deliberate, and slurred speech. However, functional communication is appropriate. Pt is functioning at his baseline (pt verbally confirmed) and does not have acute OT needs. Will sign off. No follow-up needs identified.      If plan is discharge home, recommend the following: Assist for transportation    Functional Status Assessment  Patient has had a recent decline in their functional status and demonstrates the ability to make significant improvements in function in a reasonable and predictable amount of time.  Equipment Recommendations  None recommended by OT    Recommendations for Other Services       Precautions / Restrictions Precautions Precautions: Fall Restrictions Weight Bearing Restrictions Per Provider Order: No      Mobility Bed Mobility Overal bed mobility: Needs Assistance (Pt OOB, seated in recliner upon arrival)              General bed mobility comments:  (Pt OOB, seated in recliner upon arrival)    Transfers Overall transfer level: Needs assistance Equipment used: None Transfers: Sit to/from Stand, Bed to chair/wheelchair/BSC Sit to Stand: Supervision           General transfer comment: Pt performed sit to stand transfer from recliner and functional mobility within pt room without use of AD; Anticipate pt is at baseline; pt confirmed verbally      Balance Overall balance assessment: Needs assistance Sitting-balance support: Feet supported, No upper extremity supported Sitting balance-Leahy Scale: Normal     Standing balance support: No upper extremity supported Standing balance-Leahy Scale: Fair Standing balance comment: Groomed at the sink without UE support                           ADL either performed or assessed with clinical judgement   ADL Overall ADL's : Needs assistance/impaired     Grooming: Wash/dry hands;Supervision/safety;Standing                   Toilet Transfer: Supervision/safety   Toileting- Architect and Hygiene: Modified independent       Functional mobility during ADLs: Supervision/safety General ADL Comments: Generalized supervision provided for all ADLs, anticipate pt is performing all ADLs at baseline. No phyiscal assist noted     Vision Baseline Vision/History: 1 Wears glasses Ability to See in Adequate Light: 0 Adequate Patient Visual Report: No change from baseline Vision Assessment?: No apparent visual deficits     Perception Perception: Within Functional Limits       Praxis Praxis: Not tested  Pertinent Vitals/Pain Pain Assessment Pain Assessment: No/denies pain     Extremity/Trunk Assessment Upper Extremity Assessment Upper Extremity Assessment: Overall WFL for tasks assessed   Lower Extremity Assessment Lower Extremity Assessment: Defer to PT evaluation RLE Deficits / Details: ROM WFL, hip flex 4/5,  knee ext 4/5 knee flex 3+/5, dorsiflex 2/5, plantarflex 3+/5 RLE Sensation: history of peripheral neuropathy (below knee) RLE Coordination: decreased fine motor LLE Deficits / Details: L BKA, hip flex 4/5, knee ext 4/5, knee flex 3+/5 LLE Sensation: history of peripheral neuropathy LLE Coordination: decreased fine motor;decreased gross motor   Cervical / Trunk Assessment Cervical / Trunk Assessment: Kyphotic   Communication Communication Communication: Difficulty communicating thoughts/reduced clarity of speech   Cognition Arousal: Alert Behavior During Therapy: WFL for tasks assessed/performed Overall Cognitive Status: Within Functional Limits for tasks assessed                                 General Comments: Pt reported difficulty expressing himself and saying words     General Comments  VSS, on RA    Exercises     Shoulder Instructions      Home Living Family/patient expects to be discharged to:: Private residence Living Arrangements: Children Available Help at Discharge: Family;Available 24 hours/day Type of Home: House Home Access: Ramped entrance     Home Layout: One level     Bathroom Shower/Tub: Chief Strategy Officer: Standard Bathroom Accessibility: Yes   Home Equipment: Agricultural consultant (2 wheels);Cane - single point;Shower seat;Hand held shower head;Wheelchair - manual   Additional Comments: Reports he has 4 protective/reactive dogs at home      Prior Functioning/Environment Prior Level of Function : Independent/Modified Independent             Mobility Comments: independent with household distance ambulation, uses cane outside and for very limited community distances ADLs Comments: independent in ADLs, and iADLs, does not drive        OT Problem List: Decreased strength;Decreased activity tolerance;Decreased coordination      OT Treatment/Interventions:      OT Goals(Current goals can be found in the care plan  section) Acute Rehab OT Goals Patient Stated Goal: go home OT Goal Formulation: With patient Time For Goal Achievement: 01/03/24 Potential to Achieve Goals: Good  OT Frequency:      Co-evaluation              AM-PAC OT "6 Clicks" Daily Activity     Outcome Measure Help from another person eating meals?: None Help from another person taking care of personal grooming?: A Little Help from another person toileting, which includes using toliet, bedpan, or urinal?: A Little Help from another person bathing (including washing, rinsing, drying)?: A Little Help from another person to put on and taking off regular upper body clothing?: A Little Help from another person to put on and taking off regular lower body clothing?: A Little 6 Click Score: 19   End of Session Nurse Communication: Mobility status  Activity Tolerance: Patient tolerated treatment well Patient left: in chair;with call bell/phone within reach;with family/visitor present  OT Visit Diagnosis: Unsteadiness on feet (R26.81);Other abnormalities of gait and mobility (R26.89);Muscle weakness (generalized) (M62.81);Hemiplegia and hemiparesis Hemiplegia - Right/Left: Left Hemiplegia - dominant/non-dominant: Non-Dominant Hemiplegia - caused by: Cerebral infarction                Time: 1036-1050 OT Time Calculation (min): 14 min Charges:  OT General Charges $OT Visit: 1 Visit OT Evaluation $OT Eval Moderate Complexity: 1 [address] MOTS   Kevan Ny 01/03/2024, 11:37 AM

## 2024-01-04 ENCOUNTER — Other Ambulatory Visit (HOSPITAL_COMMUNITY): Payer: Self-pay

## 2024-01-04 ENCOUNTER — Telehealth (HOSPITAL_COMMUNITY): Payer: Self-pay | Admitting: Pharmacy Technician

## 2024-01-04 DIAGNOSIS — I639 Cerebral infarction, unspecified: Secondary | ICD-10-CM | POA: Diagnosis not present

## 2024-01-04 LAB — GLUCOSE, CAPILLARY
Glucose-Capillary: 162 mg/dL — ABNORMAL HIGH (ref 70–99)
Glucose-Capillary: 174 mg/dL — ABNORMAL HIGH (ref 70–99)
Glucose-Capillary: 203 mg/dL — ABNORMAL HIGH (ref 70–99)

## 2024-01-04 MED ORDER — APIXABAN 5 MG PO TABS
5.0000 mg | ORAL_TABLET | Freq: Two times a day (BID) | ORAL | 0 refills | Status: DC
  Filled 2024-01-04: qty 60, 30d supply, fill #0

## 2024-01-04 MED ORDER — DAPAGLIFLOZIN PROPANEDIOL 5 MG PO TABS: 5.0000 mg | ORAL_TABLET | Freq: Every day | ORAL | 0 refills | Status: AC

## 2024-01-04 MED ORDER — METOPROLOL SUCCINATE ER 25 MG PO TB24
12.5000 mg | ORAL_TABLET | Freq: Every day | ORAL | 0 refills | Status: DC
  Filled 2024-01-04: qty 15, 30d supply, fill #0

## 2024-01-04 MED ORDER — LANTUS SOLOSTAR 100 UNIT/ML ~~LOC~~ SOPN: 40.0000 [IU] | PEN_INJECTOR | Freq: Two times a day (BID) | SUBCUTANEOUS | Status: AC

## 2024-01-04 MED ORDER — ASPIRIN 81 MG PO TBEC
81.0000 mg | DELAYED_RELEASE_TABLET | Freq: Every day | ORAL | 0 refills | Status: DC
  Filled 2024-01-04: qty 30, 30d supply, fill #0

## 2024-01-04 NOTE — TOC Transition Note (Signed)
Transition of Care [facility]) - Discharge Note   Patient Details  Name: Travis [name] MRN: [id number] Date of Birth: 04-07-1971  Transition of Care [facility]) CM/SW Contact:  Kermit Balo, RN Phone Number: 01/04/2024, 11:30 AM   Clinical Narrative:     Pt is discharging home today with outpatient PT through [facility]. Information on the AVS. Pt will call to schedule the first appointment. Pt starting on Eliquis. He has a $0 copay. TOC pharmacy filling the first 30 days and will use 30 day free coupon. Pt has transportation home.  Final next level of care: OP Rehab Barriers to Discharge: No Barriers Identified   Patient Goals and CMS Choice     Choice offered to / list presented to : Patient      Discharge Placement                       Discharge Plan and Services Additional resources added to the After Visit Summary for                                       Social Drivers of Health (SDOH) Interventions SDOH Screenings   Food Insecurity: No Food Insecurity (01/02/2024)  Housing: Low Risk  (01/02/2024)  Transportation Needs: No Transportation Needs (01/02/2024)  Utilities: Not At Risk (01/02/2024)  Depression (PHQ2-9): Low Risk  (02/14/2019)  Financial Resource Strain: High Risk (06/13/2023)   Received from [facility]  Physical Activity: Unknown (06/13/2023)   Received from [facility]  Social Connections: Socially Isolated (06/13/2023)   Received from [facility]  Stress: Stress Concern Present (06/13/2023)   Received from [facility]  Tobacco Use: Low Risk  (01/02/2024)     Readmission Risk Interventions     No data to display

## 2024-01-04 NOTE — Telephone Encounter (Signed)
Patient Product/process development scientist completed.    The patient is insured through [facility]. Patient has Medicare and is not eligible for a copay card, but may be able to apply for patient assistance or Medicare RX Payment Plan (Patient Must reach out to their plan, if eligible for payment plan), if available.    Ran test claim for Eliquis 5 mg and the current 30 day co-pay is $0.00.   This test claim was processed through [facility]- copay amounts may vary at other pharmacies due to pharmacy/plan contracts, or as the patient moves through the different stages of their insurance plan.     Roland Earl, CPHT Pharmacy Technician III Certified Patient Advocate [facility] Patient Advocate Team Direct Number: [phone]  Fax: [phone]

## 2024-01-04 NOTE — Progress Notes (Signed)
Patient alert and oriented, both patient and daughter Travis [name] verbalized understanding of dc instructions.Stroke education also reinforced. Patient advised to take all belongjngs and toc meds when ride arrives.

## 2024-01-04 NOTE — Plan of Care (Signed)
  Problem: Education: Goal: Knowledge of disease or condition will improve Outcome: Progressing   Problem: Ischemic Stroke/TIA Tissue Perfusion: Goal: Complications of ischemic stroke/TIA will be minimized Outcome: Progressing   Problem: Coping: Goal: Will verbalize positive feelings about self Outcome: Progressing   Problem: Self-Care: Goal: Ability to participate in self-care as condition permits will improve Outcome: Progressing   Problem: Health Behavior/Discharge Planning: Goal: Ability to manage health-related needs will improve Outcome: Progressing   Problem: Activity: Goal: Risk for activity intolerance will decrease Outcome: Progressing   Problem: Pain Managment: Goal: General experience of comfort will improve and/or be controlled Outcome: Progressing   Problem: Elimination: Goal: Will not experience complications related to bowel motility Outcome: Progressing   Problem: Skin Integrity: Goal: Risk for impaired skin integrity will decrease Outcome: Progressing

## 2024-01-04 NOTE — Discharge Instructions (Addendum)
Information on my medicine - ELIQUIS (apixaban)  This medication education was reviewed with me or my healthcare representative as part of my discharge preparation.    Why was Eliquis prescribed for you? Eliquis was prescribed for you to reduce the risk of a blood clot forming that can cause a stroke.  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  What do You need to know about Eliquis ? Take your Eliquis TWICE DAILY - one tablet in the morning and one tablet in the evening with or without food. If you have difficulty swallowing the tablet whole please discuss with your pharmacist how to take the medication safely.  Take Eliquis exactly as prescribed by your doctor and DO NOT stop taking Eliquis without talking to the doctor who prescribed the medication.  Stopping may increase your risk of developing a stroke.  Refill your prescription before you run out.  After discharge, you should have regular check-up appointments with your healthcare provider that is prescribing your Eliquis.  In the future your dose may need to be changed if your kidney function or weight changes by a significant amount or as you get older.  What do you do if you miss a dose? If you miss a dose, take it as soon as you remember on the same day and resume taking twice daily.  Do not take more than one dose of ELIQUIS at the same time to make up a missed dose.  Important Safety Information A possible side effect of Eliquis is bleeding. You should call your healthcare provider right away if you experience any of the following: Bleeding from an injury or your nose that does not stop. Unusual colored urine (red or dark brown) or unusual colored stools (red or black). Unusual bruising for unknown reasons. A serious fall or if you hit your head (even if there is no bleeding).  Some medicines may interact with Eliquis and might increase your risk of bleeding or clotting while on Eliquis. To  help avoid this, consult your healthcare provider or pharmacist prior to using any new prescription or non-prescription medications, including herbals, vitamins, non-steroidal anti-inflammatory drugs (NSAIDs) and supplements.  This website has more information on Eliquis (apixaban): http://[email or web address].com/eliquis/home

## 2024-01-04 NOTE — Progress Notes (Signed)
Piv dcd. Site unremarkable.

## 2024-01-05 NOTE — Discharge Summary (Signed)
Physician Discharge Summary   Patient: Travis [name] MRN: [id number] DOB: 06/21/1971  Admit date:     01/02/2024  Discharge date: 01/04/2024  Discharge Physician: Lynden Oxford  PCP: Jettie Pagan, NP  Recommendations at discharge: Follow-up with PCP. Follow-up with neurology. Follow-up with outpatient rehab.   Follow-up Information     [place] [facility]. Schedule an appointment as soon as possible for a visit.   Specialty: Rehabilitation Contact information: [address] [place] [address] [phone]        [facility] in [place] Follow up on 01/12/2024.   Why: Your appointment is at 9:30 am. Please arrive early and bring a picture ID, insurance card and current medications. Contact information: [address], [place], [place] [address]  [phone]        [facility]. Schedule an appointment as soon as possible for a visit in 1 month(s).   Specialty: Neurology Why: stroke clinic Contact information: [address] [place] [address] [phone]        Jettie Pagan, NP. Schedule an appointment as soon as possible for a visit in 1 week(s).   Specialty: Nurse Practitioner Why: With blood sugar log Contact information: 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  Patient was outside of the window.  Therefore did not receive any TNK. Neurology was consulted.  Stroke workup was concerning for LV thrombus.  Started on anticoagulation.  Right MCA territory acute and subacute infarct embolic likely secondary to LV thrombus CT no acute abnormality CT head and neck right M2 mid branch occlusion with distal reconstitution.  Right VA origin short segment occlusion, right V4 and left P3 moderate to severe stenosis. MRI right MCA acute and subacute infarcts 2D Echo EF 45 to 50%, possible small LV thrombus Cardiac CTA showed LV thrombus and patent bypass grafts LDL 105 HgbA1c 7.7 No antithrombotic prior to admission, now on aspirin 81 mg daily and Eliquis (apixaban) daily.  Continue on discharge. Patient counseled to be compliant with his antithrombotic medications Therapy recommendations: Home health PT, patient would like to go for outpatient PT.  Ordered. Neuro follow-up in 4 weeks recommended.   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 2D echo possible LV small thrombus which was confirmed by cardiac CTA Cardiology Dr. Eden Emms on board, now on Eliquis for anticoagulation.  Repeat 2D echo in 3 months to evaluate LV thrombus and decide duration of anticoagulation Continue aspirin 81.  History of stroke 06/2018 admitted for multiple small bilateral infarcts.  Status post tPA.  MRI also showed old right CR infarct.  CT head and neck right P1/P2 occlusion.  EF 35 to 40%.  Negative for DVT.  LDL 73, A1c 6.9.  TEE negative, no PFO.  Discharged on DAPT and Crestor 40.  Loop recorder  placed.  No A-fib found   Type 2 diabetes mellitus, uncontrolled but improving with long-term insulin use with PVD  HgbA1c 7.7 goal < 7.0 Uncontrolled, but improved from before Most likely noncompliant with diet restrictions.  Outpatient follow-up with PCP recommended.   Hypertension Stable On Lasix and metoprolol daily.  Also ramipril daily. Will continue.  Hyperlipidemia Home meds: Crestor 40, questionable compliance LDL 105, goal < 70 Now on Crestor 40   PVD status post left BKA Continue aspirin.  Obesity, Body mass index is 35.38 kg/m.  Placing the patient on Tylenol for outcome.  Consultants:  Neurology Cardiology  Procedures performed:  Echocardiogram  DISCHARGE MEDICATION: Allergies as of 01/04/2024   No Known Allergies      Medication List     STOP taking these medications    clopidogrel 75 MG tablet Commonly known as: PLAVIX   ibuprofen 400 MG tablet Commonly known as: ADVIL   ondansetron 4 MG disintegrating tablet Commonly known as: Zofran ODT       TAKE these medications    aspirin EC 81 MG tablet Take 1 tablet (81 mg total) by mouth daily. Swallow whole. What changed:  medication strength how much to take additional instructions   dapagliflozin propanediol 5 MG Tabs tablet Commonly known as: FARXIGA Take 1 tablet (5 mg total) by mouth daily.   Eliquis 5 MG Tabs tablet Generic drug: apixaban Take 1 tablet (5 mg total) by mouth 2 (two) times daily.   FreeStyle Libre 2 Sensor Misc 1 Device by Does not apply route every 14 (fourteen) days.   furosemide 20 MG tablet Commonly known as: LASIX TAKE 1 TABLET(20 MG) BY MOUTH DAILY   glucose blood test strip Commonly known as: OneTouch Verio 1 each by Other route as needed for other. Use as instructed   insulin lispro 100 UNIT/ML KwikPen Commonly known as: HUMALOG Inject 0-12 Units into the skin 4 (four) times daily -  with meals and at bedtime. Humalog SSI QID with meals (8-15 units  first 3 meals of day, 8 units at bedtime)   Insulin Pen Needle 31G X 5 MM Misc Use as directed to inject insulin. Dx Code:e11.43   Lantus SoloStar 100 UNIT/ML Solostar Pen Generic drug: insulin glargine Inject 40-50 Units into the skin 2 (two) times daily.  Lantus 50 units QAM and 40 units QPM   metoCLOPramide 10 MG tablet Commonly known as: REGLAN Take 1 tablet (10 mg total) by mouth every 8 (eight) hours as needed for nausea. What changed: when to take this   metoprolol succinate 25 MG 24 hr tablet Commonly known as: TOPROL-XL Take 0.5 tablets (12.5 mg total) by mouth daily.   omeprazole 40 MG capsule Commonly known as: PRILOSEC Take 40 mg by mouth daily.   OneTouch Delica Lancets 33G Misc 1 application by Does not apply route 4 (four) times daily.   OneTouch Verio w/Device Kit 1 application by Does not apply route 4 (four) times daily.   ramipril 2.5 MG capsule Commonly known as: ALTACE TAKE 1 CAPSULE(2.5 MG) BY MOUTH DAILY   rosuvastatin 40 MG tablet Commonly known as: CRESTOR TAKE 1 TABLET(40 MG)  BY MOUTH DAILY       Disposition: Home Diet recommendation: Carb modified diet  Discharge Exam: Vitals:   01/03/24 2343 01/04/24 0355 01/04/24 0730 01/04/24 0730  BP: 119/74 127/66 (!) 144/67 (!) 144/67  Pulse: 70 74 74 74  Resp:   17 17  Temp: 98.3 F (36.8 C) 98.5 F (36.9 C) 97.7 F (36.5 C) 97.7 F (36.5 C)  TempSrc: Oral Oral Oral Oral  SpO2: 99% 97% 97% 97%  Weight:      Height:       General: Appear in no distress; no visible Abnormal Neck Mass Or lumps, Conjunctiva normal Cardiovascular: S1 and S2 Present, no Murmur, Respiratory: good respiratory effort, Bilateral Air entry present and CTA, no Crackles, no wheezes Abdomen: Bowel Sound present, Non tender  Extremities: no Pedal edema, left BKA Neurology: alert and oriented to time, place, and person left-sided weakness, mild dysarthria Filed Weights   01/02/24 0700  Weight: 125 kg   Condition at  discharge: stable  The results of significant diagnostics from this hospitalization (including imaging, microbiology, ancillary and laboratory) are listed below for reference.   Imaging Studies: CT CORONARY MORPH W/CTA COR W/SCORE W/CA W/CM &/OR WO/CM Addendum Date: 01/03/2024 ADDENDUM REPORT: 01/03/2024 20:13 EXAM: OVER-READ INTERPRETATION  CT CHEST The following report is an over-read performed by radiologist Dr. Myra Gianotti [facility] on 01/03/2024. This over-read does not include interpretation of cardiac or coronary anatomy or pathology. The coronary CTA interpretation by the cardiologist is attached. COMPARISON:  None. FINDINGS: The imaged portion of the mediastinum is unremarkable. Left lower lobe atelectasis/scarring. No pleural effusion, or pneumothorax. No acute abnormality in the visualized upper abdomen. No acute osseous abnormality. IMPRESSION: No acute abnormality. Electronically Signed   By: Minerva Fester M.D.   On: 01/03/2024 20:13   Result Date: 01/03/2024 CLINICAL DATA:  Evaluate for LV thrombus EXAM: Cardiac TAVR CT TECHNIQUE: The patient was scanned on a [employer]. A 120 kV retrospective scan was triggered in the descending thoracic aorta at 111 HU's. Gantry rotation speed was 250 msecs and collimation was .6 mm. 100mg  Lopressor was given. The 3D data set was reconstructed in 5% intervals of the R-R cycle. Systolic and diastolic phases were analyzed on a dedicated work station using MPR, MIP and VRT modes. The patient received 100 cc of contrast. MEDICATIONS: MEDICATIONS 100mg  lopressor FINDINGS: Left Ventricle: Moderately dilated. LV apical thrombus measures 21mm x 15mm Left Atrium: Mild enlargement. No thrombus seen in LAA Pulmonary Veins: Normal configuration Right Ventricle: Mild dilatation Right Atrium: Mild enlargement Cardiac valves: No calcifications Thoracic aorta: Normal size Pulmonary Arteries: Dilated main PA measuring 32mm Systemic Veins:  Normal drainage Pericardium: Normal thickness Coronary arteries: S/p CABG with patent LIMA to LAD, SVG to RPDA and SVG to LCX IMPRESSION: 1. 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 S/p CABG with patent LIMA to LAD, SVG to RPDA, and SVG to LCX 3. Dilated main pulmonary artery measures 32mm Electronically Signed: By: Epifanio Lesches M.D. On: 01/03/2024 17:00   DG ESOPHAGUS W SINGLE CM (SOL OR THIN BA) Result Date: 01/03/2024 CLINICAL DATA:  Patient admitted with CVA, concern for apical thrombus on TTE - unable to undergo TEE due to previously reported esophageal stricture on esophagram in 2021 which per patient has not been treated. Request for esophagram to evaluate previous stricture. Patient denies any difficulty swallowing, his only complaint is reflux. EXAM: ESOPHAGUS/BARIUM SWALLOW/TABLET STUDY TECHNIQUE: Single contrast examination was performed using thin liquid barium. This  exam was performed by Lynnette Caffey, PA-C, and was supervised and interpreted by Marliss Coots, MD. FLUOROSCOPY: Radiation Exposure Index (as provided by the fluoroscopic device): 91.00 mGy Kerma COMPARISON:  Esophagram 12/02/20. FINDINGS: Swallowing: Appears normal. No vestibular penetration or aspiration seen on problem focused exam. Pharynx: Unremarkable. Esophagus: Normal appearance. Smooth, uniform narrowing at the gastroesophageal junction which appears to be more distal and not as significant or well defined as the stricture seen on previous esophagram. This may be due to the inability of the patient to tolerate double contrast exam today. Esophageal motility: At least moderate dysmotility with tertiary contractions seen in the lower esophagus. Motility exam is limited due to patient inability to lay prone. Hiatal Hernia: None. Gastroesophageal reflux: None visualized on limited exam. Ingested 13 mm barium tablet: Became stuck just proximal to the at the gastroesophageal junction for approximately 3 minutes before  passing into the stomach after additional sips of water and thin barium. Other: Patient unable to stand due to BKA and weakness - entire exam performed in supine AP and LPO with exam table tilted to 30 degrees. He is unable to tolerate gas crystals due to stomach pain per his report. IMPRESSION: Single contrast esophagram significant for: 1. Smooth, uniform narrowing at the gastroesophageal junction. This does not appear to directly correlate to previously seen stricture on 2021 esophagram, however this may be due to difference in exam technique as patient is unable to tolerate double contrast exam today. Consider EGD for further evaluation. 2. 13 mm tablet became stuck at just proximal to the gastroesophageal junction for approximately 3 minutes before passing into the stomach. This area roughly correlates to previously seen stricture. 3.  At least moderate esophageal dysmotility Electronically Signed   By: Marliss Coots M.D.   On: 01/03/2024 10:19   ECHOCARDIOGRAM COMPLETE Result Date: 01/02/2024    ECHOCARDIOGRAM REPORT   Patient Name:   Travis [name] Date of Exam: 01/02/2024 Medical Rec #:  [id number]        Height:       74.0 in Accession #:    [id number]       Weight:       275.6 lb Date of Birth:  06/21/1971         BSA:          2.491 m Patient Age:    52 years         BP:           163/70 mmHg Patient Gender: M                HR:           80 bpm. Exam Location:  Inpatient Procedure: 2D Echo, Cardiac Doppler, Color Doppler and Intracardiac            Opacification Agent Indications:    Stroke  History:        Patient has prior history of Echocardiogram examinations, most                 recent 06/25/2018. CAD; Risk Factors:Diabetes and Hypertension.  Sonographer:    Vern Claude Referring Phys: [id number] DEVON SHAFER  Sonographer Comments: Technically difficult study due to poor echo windows and suboptimal subcostal window. IMPRESSIONS  1. Possible small LV apical thrombus (clip 78-79). Image quality is  suboptimal. In setting of stroke, consider cardiac MRI to further assess.  2. Left ventricular ejection fraction, by estimation, is 45 to 50%. The left ventricle has mildly decreased function.  Left ventricular endocardial border not optimally defined to evaluate regional wall motion. There is mild left ventricular hypertrophy.  Indeterminate diastolic filling due to E-A fusion. There is abnormal septal motion.  3. Right ventricular systolic function is normal. The right ventricular size is normal. Tricuspid regurgitation signal is inadequate for assessing PA pressure.  4. The mitral valve is grossly normal. Trivial mitral valve regurgitation.  5. The aortic valve was not well visualized. Aortic valve regurgitation is not visualized. No aortic stenosis is present.  6. The inferior vena cava is normal in size with <50% respiratory variability, suggesting right atrial pressure of 8 mmHg. FINDINGS  Left Ventricle: Left ventricular ejection fraction, by estimation, is 45 to 50%. The left ventricle has mildly decreased function. Left ventricular endocardial border not optimally defined to evaluate regional wall motion. The left ventricular internal cavity size was normal in size. There is mild left ventricular hypertrophy. Abnormal septal motion. Indeterminate diastolic filling due to E-A fusion. Right Ventricle: The right ventricular size is normal. Right vetricular wall thickness was not well visualized. Right ventricular systolic function is normal. Tricuspid regurgitation signal is inadequate for assessing PA pressure. Left Atrium: Left atrial size was normal in size. Right Atrium: Right atrial size was normal in size. Pericardium: There is no evidence of pericardial effusion. Mitral Valve: The mitral valve is grossly normal. Trivial mitral valve regurgitation. MV peak gradient, 2.8 mmHg. The mean mitral valve gradient is 2.0 mmHg. Tricuspid Valve: The tricuspid valve is not well visualized. Tricuspid valve  regurgitation is trivial. Aortic Valve: The aortic valve was not well visualized. Aortic valve regurgitation is not visualized. No aortic stenosis is present. Aortic valve mean gradient measures 3.0 mmHg. Aortic valve peak gradient measures 4.2 mmHg. Aortic valve area, by VTI measures 3.08 cm. Pulmonic Valve: The pulmonic valve was not well visualized. Pulmonic valve regurgitation is trivial. Aorta: The aortic root is normal in size and structure. Venous: The inferior vena cava was not well visualized. The inferior vena cava is normal in size with less than 50% respiratory variability, suggesting right atrial pressure of 8 mmHg. IAS/Shunts: The interatrial septum was not well visualized.  LEFT VENTRICLE PLAX 2D LVIDd:         5.10 cm      Diastology LVIDs:         3.90 cm      LV e' medial:    6.64 cm/s LV PW:         1.10 cm      LV E/e' medial:  13.2 LV IVS:        0.80 cm      LV e' lateral:   8.70 cm/s LVOT diam:     2.20 cm      LV E/e' lateral: 10.1 LV SV:         75 LV SV Index:   30 LVOT Area:     3.80 cm  LV Volumes (MOD) LV vol d, MOD A4C: 199.0 ml LV vol s, MOD A4C: 109.0 ml LV SV MOD A4C:     199.0 ml RIGHT VENTRICLE            IVC RV Basal diam:  3.60 cm    IVC diam: 1.50 cm RV S prime:     6.74 cm/s TAPSE (M-mode): 1.7 cm LEFT ATRIUM             Index        RIGHT ATRIUM  Index LA diam:        4.00 cm 1.61 cm/m   RA Area:     16.50 cm LA Vol (A2C):   74.3 ml 29.83 ml/m  RA Volume:   41.40 ml  16.62 ml/m LA Vol (A4C):   73.1 ml 29.35 ml/m LA Biplane Vol: 75.7 ml 30.39 ml/m  AORTIC VALVE                    PULMONIC VALVE AV Area (Vmax):    3.30 cm     PV Vmax:       0.86 m/s AV Area (Vmean):   2.96 cm     PV Peak grad:  3.0 mmHg AV Area (VTI):     3.08 cm AV Vmax:           103.00 cm/s AV Vmean:          82.200 cm/s AV VTI:            0.242 m AV Peak Grad:      4.2 mmHg AV Mean Grad:      3.0 mmHg LVOT Vmax:         89.40 cm/s LVOT Vmean:        64.100 cm/s LVOT VTI:          0.196 m  LVOT/AV VTI ratio: 0.81  AORTA Ao Root diam: 3.10 cm Ao Asc diam:  3.00 cm MITRAL VALVE MV Area (PHT): 4.60 cm    SHUNTS MV Area VTI:   4.46 cm    Systemic VTI:  0.20 m MV Peak grad:  2.8 mmHg    Systemic Diam: 2.20 cm MV Mean grad:  2.0 mmHg MV Vmax:       0.83 m/s MV Vmean:      65.1 cm/s MV Decel Time: 165 msec MV E velocity: 87.50 cm/s MV A velocity: 78.60 cm/s MV E/A ratio:  1.11 Weston Brass MD Electronically signed by Weston Brass MD Signature Date/Time: 01/02/2024/11:57:47 AM    Final    MR BRAIN WO CONTRAST Result Date: 01/02/2024 CLINICAL DATA:  Stroke, follow up EXAM: MRI HEAD WITHOUT CONTRAST TECHNIQUE: Multiplanar, multiecho pulse sequences of the brain and surrounding structures were obtained without intravenous contrast. COMPARISON:  Head CT 01/02/2024 FINDINGS: Brain: Acute infarcts in the right MCA territory involving the body of the caudate on the right, right insular cortex, right frontal lobe. No hemorrhage. No hydrocephalus. No extra-axial fluid collection. Chronic left thalamic and right occipital no mass effect. No mass lesion. Vascular: Normal flow voids. Skull and upper cervical spine: Normal marrow signal. Sinuses/Orbits: Trace left mastoid effusion. No middle ear effusion. Paranasal sinuses are clear. Left lens replacement. Orbits are otherwise unremarkable. Other: None. IMPRESSION: Acute infarcts in the right MCA territory involving the body of the caudate on the right, right insular cortex, and right frontal lobe. No hemorrhage or mass effect. Electronically Signed   By: Lorenza Cambridge M.D.   On: 01/02/2024 10:16   CT ANGIO HEAD NECK W WO CM (CODE STROKE) Result Date: 01/02/2024 CLINICAL DATA:  Neuro deficit, acute, stroke suspected. Slurred speech. EXAM: CT ANGIOGRAPHY HEAD AND NECK WITH AND WITHOUT CONTRAST TECHNIQUE: Multidetector CT imaging of the head and neck was performed using the standard protocol during bolus administration of intravenous contrast. Multiplanar CT  image reconstructions and MIPs were obtained to evaluate the vascular anatomy. Carotid stenosis measurements (when applicable) are obtained utilizing NASCET criteria, using the distal internal carotid diameter as the denominator. RADIATION  DOSE REDUCTION: This exam was performed according to the departmental dose-optimization program which includes automated exposure control, adjustment of the mA and/or kV according to patient size and/or use of iterative reconstruction technique. CONTRAST:  75mL OMNIPAQUE IOHEXOL 350 MG/ML SOLN COMPARISON:  CTA head and neck 06/25/2018 FINDINGS: CTA NECK FINDINGS Aortic arch: Normal variant aortic arch branching pattern with common origin of the brachiocephalic and left common carotid arteries. Widely patent arch vessel origins. Right carotid system: Patent without evidence of stenosis or dissection. Left carotid system: Patent without evidence of stenosis or dissection. Vertebral arteries: New short segment occlusion of the right vertebral artery at its origin over an approximately 1 cm long segment with the vessel being patent throughout the remainder of the neck. Patent left vertebral artery with suboptimal assessment of the proximal V1 segment but no evidence of a significant stenosis more distally. Skeleton: Mild cervical spondylosis. Other neck: No evidence of cervical lymphadenopathy or mass. Upper chest: Clear lung apices. Review of the MIP images confirms the above findings CTA HEAD FINDINGS Anterior circulation: The internal carotid arteries are patent from skull base to carotid termini with mild atherosclerosis not resulting in a significant stenosis. ACAs and MCAs are patent proximally without evidence of a significant A1 or M1 stenosis. There is occlusion of a mid right M2 branch vessel with distal reconstitution. No aneurysm is identified. Posterior circulation: The intracranial vertebral arteries are patent to the basilar with moderate multifocal narrowing of the  right V4 segment. Patent PICA and SCA origins are visualized bilaterally. The basilar artery is widely patent. There are small right and large left posterior communicating arteries. The PCAs are patent with branch vessel irregularity, including a severe proximal left P3 stenosis with diminished opacification distally. No aneurysm is identified. Venous sinuses: Patent. Anatomic variants: None. Review of the MIP images confirms the above findings These results were communicated to Dr. Iver Nestle at 8:42 am on 01/02/2024 by text page via the [facility] messaging system. IMPRESSION: 1. Occlusion of a mid right M2 branch vessel with distal reconstitution. 2. Short segment occlusion of the right vertebral artery at its origin. 3. Intracranial atherosclerosis including moderate right V4 and severe left P3 stenoses. Electronically Signed   By: Sebastian Ache M.D.   On: 01/02/2024 08:53   CT HEAD CODE STROKE WO CONTRAST Result Date: 01/02/2024 CLINICAL DATA:  Code stroke.  Slurred speech EXAM: CT HEAD WITHOUT CONTRAST TECHNIQUE: Contiguous axial images were obtained from the base of the skull through the vertex without intravenous contrast. RADIATION DOSE REDUCTION: This exam was performed according to the departmental dose-optimization program which includes automated exposure control, adjustment of the mA and/or kV according to patient size and/or use of iterative reconstruction technique. COMPARISON:  06/26/2018 brain MRI FINDINGS: Brain: No evidence of acute infarction, hemorrhage, hydrocephalus, extra-axial collection or mass lesion/mass effect. 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 Vascular: No hyperdense vessel. Premature atheromatous calcification. Skull: Normal. Negative for fracture or focal lesion. Sinuses/Orbits: No acute finding Other: Prelim sent to neurology in epic CT. ASPECTS ([place]ta Stroke Program Early CT Score) - Ganglionic level infarction  (caudate, lentiform nuclei, internal capsule, insula, M1-M3 cortex): 7 - Supraganglionic infarction (M4-M6 cortex): 3 Total score (0-10 with 10 being normal): 10 IMPRESSION: Chronic infarcts in the known from 2019. No acute or interval finding. Electronically Signed   By: Tiburcio Pea M.D.   On: 01/02/2024 07:55    Microbiology: Results for orders placed or performed  during the hospital encounter of 10/29/18  Blood culture (routine x 2)     Status: None   Collection Time: 10/29/18  3:57 PM   Specimen: BLOOD  Result Value Ref Range Status   Specimen Description BLOOD LEFT ANTECUBITAL  Final   Special Requests   Final    BOTTLES DRAWN AEROBIC AND ANAEROBIC Blood Culture results may not be optimal due to an inadequate volume of blood received in culture bottles   Culture   Final    NO GROWTH 5 DAYS Performed at [facility], [address], [place], [place] [address]    Report Status 11/03/2018 FINAL  Final  Blood culture (routine x 2)     Status: None   Collection Time: 10/29/18  3:57 PM   Specimen: BLOOD  Result Value Ref Range Status   Specimen Description BLOOD RIGHT ANTECUBITAL  Final   Special Requests   Final    BOTTLES DRAWN AEROBIC AND ANAEROBIC Blood Culture results may not be optimal due to an inadequate volume of blood received in culture bottles   Culture   Final    NO GROWTH 5 DAYS Performed at [facility], [address] [place], [place] [address]    Report Status 11/03/2018 FINAL  Final   Labs: CBC: Recent Labs  Lab 01/02/24 0738 01/02/24 0742 01/03/24 0433  WBC 11.1*  --  12.5*  NEUTROABS 8.3*  --   --   HGB 14.6 15.0 15.2  HCT 43.1 44.0 45.0  MCV 89.0  --  88.4  PLT 264  --  328   Basic Metabolic Panel: Recent Labs  Lab 01/02/24 0738 01/02/24 0742 01/03/24 0433  NA 131* 135 135  K 4.4 4.4 3.8  CL 99 102 101  CO2 22  --  26  GLUCOSE 227* 233* 213*  BUN 13 16 8   CREATININE 0.90 0.90 1.13  CALCIUM 8.8*  --  8.8*   Liver Function  Tests: Recent Labs  Lab 01/02/24 0738 01/03/24 0433  AST 21 16  ALT 9 11  ALKPHOS 57 63  BILITOT 1.6* 1.7*  PROT 6.5 7.1  ALBUMIN 3.5 3.7   CBG: Recent Labs  Lab 01/03/24 2013 01/03/24 2139 01/04/24 0034 01/04/24 0400 01/04/24 0731  GLUCAP 247* 224* 162* 203* 174*    Discharge time spent: greater than 30 minutes.  Author: Lynden Oxford, MD  [facility] 01/04/2024

## 2024-01-17 ENCOUNTER — Other Ambulatory Visit (HOSPITAL_COMMUNITY): Payer: Self-pay

## 2024-01-17 ENCOUNTER — Ambulatory Visit: Payer: 59

## 2024-01-26 ENCOUNTER — Encounter: Payer: Self-pay | Admitting: Cardiovascular Disease

## 2024-01-26 ENCOUNTER — Telehealth (HOSPITAL_BASED_OUTPATIENT_CLINIC_OR_DEPARTMENT_OTHER): Payer: Self-pay | Admitting: Cardiovascular Disease

## 2024-01-26 NOTE — Telephone Encounter (Signed)
Patient contacted 3x with no success, will send letter.

## 2024-01-26 NOTE — Telephone Encounter (Signed)
-----   Message from Charlton Haws sent at 01/03/2024  5:06 PM EST ----- Needs TOC with Dr Duke Salvia or [place] post hospital for CVA history CABG and LV apical thrombus

## 2024-02-13 ENCOUNTER — Encounter: Payer: Self-pay | Admitting: Adult Health

## 2024-02-13 ENCOUNTER — Ambulatory Visit (INDEPENDENT_AMBULATORY_CARE_PROVIDER_SITE_OTHER): Payer: 59 | Admitting: Adult Health

## 2024-02-13 VITALS — BP 141/75 | HR 80 | Ht 74.0 in | Wt 271.0 lb

## 2024-02-13 DIAGNOSIS — Z8673 Personal history of transient ischemic attack (TIA), and cerebral infarction without residual deficits: Secondary | ICD-10-CM

## 2024-02-13 DIAGNOSIS — I513 Intracardiac thrombosis, not elsewhere classified: Secondary | ICD-10-CM

## 2024-02-13 DIAGNOSIS — I63411 Cerebral infarction due to embolism of right middle cerebral artery: Secondary | ICD-10-CM

## 2024-02-13 NOTE — Patient Instructions (Signed)
 Continue aspirin 81 mg daily and Eliquis 5 mg twice daily for LV thrombus and stroke prevention   and continue Crestor 40 mg daily for secondary stroke prevention managed/prescribed by PCP/cardiology  Would highly recommend you routinely monitor blood pressure at home and keep a log of this - bring this to your cardiology and PCP follow up visits   Please contact cardiology at heart care at [facility] to schedule follow-up visit with Dr. Duke Salvia at [phone] - ongoing duration of Eliquis and repeating echocardiogram will be discussed at that visit  Consider completing lab work to look for clotting disorder once you stop the Eliquis - please let me know when this happens so orders can be placed   Continue to follow up with PCP regarding blood pressure, cholesterol and diabetes management  Maintain strict control of hypertension with blood pressure goal below 130/90, diabetes with hemoglobin A1c goal below 7.0 % and cholesterol with LDL cholesterol (bad cholesterol) goal below 70 mg/dL.   Signs of a Stroke? Follow the BEFAST method:  Balance Watch for a sudden loss of balance, trouble with coordination or vertigo Eyes Is there a sudden loss of vision in one or both eyes? Or double vision?  Face: Ask the person to smile. Does one side of the face droop or is it numb?  Arms: Ask the person to raise both arms. Does one arm drift downward? Is there weakness or numbness of a leg? Speech: Ask the person to repeat a simple phrase. Does the speech sound slurred/strange? Is the person confused ? Time: If you observe any of these signs, call 911.        Thank you for coming to see [place] at [facility]. I hope we have been able to provide you high quality care today.  You may receive a patient satisfaction survey over the next few weeks. We would appreciate your feedback and comments so that we may continue to improve ourselves and the health of our patients.

## 2024-02-13 NOTE — Progress Notes (Signed)
 [facility] [address] [place]. [name] [address] [phone]       HOSPITAL FOLLOW UP NOTE  [name] Date of Birth:  November 20, 1971 Medical Record Number:  [id number]   Reason for Referral:  hospital stroke follow up    SUBJECTIVE:   CHIEF COMPLAINT:  Chief Complaint  Patient presents with   Cerebrovascular Accident    Rm 3 with sister [name] Pt is well and stable, reports no CVA concerns. Symptoms have resolved.     HPI:   [name] is a 53 y.o. male with history of CHF, CAD/MI status post CABG in 11/2017, PVD status post left BKA, history of LV aneurysm, stroke in 06/2018 who presented to ED on 01/02/2024 with left upper extremity weakness, left facial droop, slurred speech and elevated BP and glucose.  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  CTA head/neck showed multifocal intracranial stenosis as noted below.  LDL 105.  A1c 7.7.  He was placed on aspirin and Eliquis for LV thrombus and recommended repeat 2D echo in 3 months with cardiology to further decide duration of anticoagulation.  Continued on Crestor 40 mg daily with questionable compliance.  Advise close PCP follow-up for uncontrolled DM.  Therapies recommended home health PT.   Today, 02/13/2024, patient is being seen for initial hospital follow-up accompanied by his sister.  He has been doing well since discharge and denies any residual stroke deficits.  He can have some slurring of speech with fatigue but this has been present since stroke in 2019.  No new stroke/TIA symptoms.  Ambulates with a cane when out doors, will use prosthetic for L BKA, no recent falls.  He also mentions episodes of dizziness initially more with position change which has since resolved but has been noticing some dizziness even while sitting over the past week.  He does not routinely monitor  blood pressure at home.  Reports drinking adequate water but does not specifically measure daily amount.  Remains on Eliquis and aspirin without side effects.  He denies taking Crestor as sister thought he was advised to stop this medication at hospital discharge.  He was also started on metoprolol at hospital discharge but denies taking, he has furosemide and ramipril on medication list but denies taking.  He does not routinely monitor blood pressure at home.  He does report prior episodes of dizziness more so with position changes but this has since resolved but over the past week, has been experiencing some dizziness/lightheadedness even while sitting.  He does report drinking adequate water per day but does not specifically measure daily amount.  He has not yet had follow-up with cardiology (office has attempted to contact patient to schedule but has been unable to contact).  He has since had follow-up with PCP and has follow-up visit next week.      PERTINENT IMAGING  CT no acute abnormality CT head and neck right M2 mid branch occlusion with distal reconstitution.  Right VA origin short segment occlusion, right V4 and left P3 moderate to severe stenosis. MRI right MCA acute and subacute infarcts 2D Echo EF 45 to 50%, possible small LV thrombus Cardiac CTA showed LV thrombus and patent bypass grafts LDL 105 HgbA1c 7.7    ROS:   14 system review of systems performed and negative with exception of those listed in HPI  PMH:  Past Medical  History:  Diagnosis Date   Acute on chronic systolic and diastolic heart failure, NYHA class 3 (HCC) 09/14/2017   Acute osteomyelitis of metatarsal bone of left foot (HCC) 11/12/2015   CAD in native artery 09/14/2017   Closed fracture of right tibial plateau 11/11/2015   Coronary artery disease    Dehiscence of amputation stump (HCC)    left leg   Depression with anxiety    Diabetes mellitus    INSULIN DEPENDENT Type II   Diabetic peripheral  neuropathy associated with type 2 diabetes mellitus (HCC) 08/30/2008   Qualifier: Diagnosis of  By: Daphine Deutscher FNP, [place]     Diabetic ulcer of left foot (HCC) 11/14/2015   Gastroparesis    GERD (gastroesophageal reflux disease)    History of loop recorder 06/2018   Last remote device check was on 02/14/2019   Hypertension    patient denies   Left ventricular aneurysm    REPORTS HE IS NOT AWAREW OF THIS    Myocardial infarction (HCC)    Osteomyelitis (HCC)    Osteoporosis 11/12/2015   Peripheral neuropathy    feet and below bilateral knees   Peripheral vascular disease (HCC)    PAD in Left leg and some in right leg   Pressure injury of left foot, stage 2 (HCC) 02/14/2019   Sepsis (HCC) 10/29/2018   Shortness of breath dyspnea    Stroke (HCC) 05/2018   Subacute osteomyelitis, left ankle and foot (HCC) 10/20/2016   Vitamin D deficiency 11/12/2015    PSH:  Past Surgical History:  Procedure Laterality Date   AMPUTATION Left 11/28/2015   Procedure: Left 4th Ray Amputation;  Surgeon: Nadara Mustard, MD;  Location: [facility] OR;  Service: Orthopedics;  Laterality: Left;   AMPUTATION Left 10/29/2016   Procedure: LEFT 5TH RAY AMPUTATION;  Surgeon: Nadara Mustard, MD;  Location: [facility] OR;  Service: Orthopedics;  Laterality: Left;   AMPUTATION Left 03/09/2019   Procedure: LEFT FOOT 5TH RAY AMPUTATION;  Surgeon: Nadara Mustard, MD;  Location: [facility] OR;  Service: Orthopedics;  Laterality: Left;   AMPUTATION Left 04/18/2019   Procedure: LEFT BELOW KNEE AMPUTATION;  Surgeon: Nadara Mustard, MD;  Location[facility] OR;  Service: Orthopedics;  Laterality: Left;   ANTERIOR VITRECTOMY Left 04/12/2016   Procedure: ANTERIOR CHAMBER WASH OUT WITH GAS FLUID EXCHANGE;  Surgeon: Carmela Rima, MD;  Location: [facility] OR;  Service: Ophthalmology;  Laterality: Left;   CARDIAC CATHETERIZATION  10/2017   CATARACT EXTRACTION Left    CHOLECYSTECTOMY N/A 01/04/2019   Procedure: LAPAROSCOPIC CHOLECYSTECTOMY;  Surgeon: Gaynelle Adu, MD;   Location: [facility] ORS;  Service: General;  Laterality: N/A;   CORONARY ARTERY BYPASS GRAFT N/A 11/29/2017    LIMA-LAD, SVG-PDA, SVG-CFX  Procedure: CORONARY ARTERY BYPASS GRAFTING (CABG) times three using the left saphaneous vien. harvested endoscopicly and left internal mammary artery.;  Surgeon: Kerin Perna, MD;  Location: [facility] OR;  Service: Open Heart Surgery;  Laterality: N/A;   EYE SURGERY     left eye surgery 04/2016   IR CHOLANGIOGRAM EXISTING TUBE  10/17/2018   IR CHOLANGIOGRAM EXISTING TUBE  10/30/2018   IR PERC CHOLECYSTOSTOMY  09/05/2018   KNEE SURGERY Left    LOOP RECORDER INSERTION N/A 06/28/2018   Procedure: LOOP RECORDER INSERTION;  Surgeon: Duke Salvia, MD;  Location: [facility] INVASIVE CV LAB;  Service: Cardiovascular;  Laterality: N/A;   RIGHT/LEFT HEART CATH AND CORONARY ANGIOGRAPHY N/A 10/19/2017   Procedure: RIGHT/LEFT HEART CATH AND CORONARY ANGIOGRAPHY;  Surgeon: Swaziland, Peter  M, MD;  Location: [facility] INVASIVE CV LAB;  Service: Cardiovascular;  Laterality: N/A;   SHOULDER SURGERY Right    TEE WITHOUT CARDIOVERSION N/A 11/29/2017   Procedure: TRANSESOPHAGEAL ECHOCARDIOGRAM (TEE);  Surgeon: Donata Clay, Theron Arista, MD;  Location: [facility] OR;  Service: Open Heart Surgery;  Laterality: N/A;   TEE WITHOUT CARDIOVERSION N/A 06/28/2018   Procedure: TRANSESOPHAGEAL ECHOCARDIOGRAM (TEE);  Surgeon: Jake Bathe, MD;  Location: [facility] ENDOSCOPY;  Service: Cardiovascular;  Laterality: N/A;   TOE AMPUTATION Left 11/28/15   4th toe    Social History:  Social History   Socioeconomic History   Marital status: Divorced    Spouse name: Not on file   Number of children: 3   Years of education: HS   Highest education level: Not on file  Occupational History   Occupation: Disabled  Tobacco Use   Smoking status: Never   Smokeless tobacco: Never  Vaping Use   Vaping status: Never Used  Substance and Sexual Activity   Alcohol use: No   Drug use: No   Sexual activity: Yes  Other Topics Concern   Not on  file  Social History Narrative   Lives at home with his children.   Right-handed.   No caffeine use.   Social Drivers of Health   Financial Resource Strain: High Risk (01/19/2024)   Received from [facility]   Overall Financial Resource Strain (CARDIA)    Difficulty of Paying Living Expenses: Very hard  Food Insecurity: Food Insecurity Present (01/19/2024)   Received from [facility]   Hunger Vital Sign    Worried About Running Out of Food in the Last Year: Sometimes true    Ran Out of Food in the Last Year: Sometimes true  Transportation Needs: No Transportation Needs (01/19/2024)   Received from [facility] - Transportation    Lack of Transportation (Medical): No    Lack of Transportation (Non-Medical): No  Physical Activity: Unknown (06/13/2023)   Received from [facility]   Exercise Vital Sign    Days of Exercise per Week: 0 days    Minutes of Exercise per Session: Not on file  Stress: Stress Concern Present (06/13/2023)   Received from [facility] - Occupational Stress Questionnaire    Feeling of Stress : Very much  Social Connections: Socially Isolated (06/13/2023)   Received from [facility]   Social Network    How would you rate your social network (family, work, friends)?: Little participation, lonely and socially isolated  Intimate Partner Violence: Not At Risk (01/02/2024)   Humiliation, Afraid, Rape, and Kick questionnaire    Fear of Current or Ex-Partner: No    Emotionally Abused: No    Physically Abused: No    Sexually Abused: No    Family History:  Family History  Problem Relation Age of Onset   Cancer Paternal Grandmother    COPD Mother    Heart failure Mother    Anxiety disorder Mother    Depression Mother    Fibromyalgia Mother    Esophageal cancer Father        Smoker, still does   Diabetes Sister    Fibromyalgia Sister    Diabetes Maternal Uncle    Diabetes Maternal Grandmother    GER  disease Sister    Liver disease Paternal Grandfather    Hemachromatosis Paternal Grandfather    Diabetes Maternal Grandfather     Medications:   Current Outpatient Medications on File Prior  to Visit  Medication Sig Dispense Refill   apixaban (ELIQUIS) 5 MG TABS tablet Take 1 tablet (5 mg total) by mouth 2 (two) times daily. 60 tablet 0   aspirin EC 81 MG tablet Take 1 tablet (81 mg total) by mouth daily. Swallow whole. 30 tablet 0   Blood Glucose Monitoring Suppl (ONETOUCH VERIO) w/Device KIT 1 application by Does not apply route 4 (four) times daily. 1 kit 0   Continuous Blood Gluc Sensor (FREESTYLE LIBRE 2 SENSOR) MISC 1 Device by Does not apply route every 14 (fourteen) days. 6 each 3   dapagliflozin propanediol (FARXIGA) 5 MG TABS tablet Take 1 tablet (5 mg total) by mouth daily. 30 tablet 0   furosemide (LASIX) 20 MG tablet TAKE 1 TABLET(20 MG) BY MOUTH DAILY 90 tablet 0   glucose blood (ONETOUCH VERIO) test strip 1 each by Other route as needed for other. Use as instructed 100 each 12   insulin glargine (LANTUS SOLOSTAR) 100 UNIT/ML Solostar Pen Inject 40-50 Units into the skin 2 (two) times daily.  Lantus 50 units QAM and 40 units QPM     insulin lispro (HUMALOG) 100 UNIT/ML KwikPen Inject 0-12 Units into the skin 4 (four) times daily -  with meals and at bedtime. Humalog SSI QID with meals (8-15 units first 3 meals of day, 8 units at bedtime)     Insulin Pen Needle 31G X 5 MM MISC Use as directed to inject insulin. Dx Code:e11.43 30 each 12   metoCLOPramide (REGLAN) 10 MG tablet Take 1 tablet (10 mg total) by mouth every 8 (eight) hours as needed for nausea. (Patient taking differently: Take 10 mg by mouth daily.) 30 tablet 0   metoprolol succinate (TOPROL-XL) 25 MG 24 hr tablet Take 0.5 tablets (12.5 mg total) by mouth daily. 15 tablet 0   omeprazole (PRILOSEC) 40 MG capsule Take 40 mg by mouth daily.     ONETOUCH DELICA LANCETS 33G MISC 1 application by Does not apply route 4 (four)  times daily. 100 each 12   ramipril (ALTACE) 2.5 MG capsule TAKE 1 CAPSULE(2.5 MG) BY MOUTH DAILY 90 capsule 1   rosuvastatin (CRESTOR) 40 MG tablet TAKE 1 TABLET(40 MG) BY MOUTH DAILY 90 tablet 1   No current facility-administered medications on file prior to visit.    Allergies:  No Known Allergies    OBJECTIVE:  Physical Exam  Vitals:   02/13/24 1010  BP: (!) 141/75  Pulse: 80  Weight: 271 lb (122.9 kg)  Height: 6\' 2"  (1.88 m)   Body mass index is 34.79 kg/m. No results found.   General: well developed, well nourished, pleasant middle-age Caucasian male, seated, in no evident distress Head: head normocephalic and atraumatic.   Neck: supple with no carotid or supraclavicular bruits Cardiovascular: regular rate and rhythm, no murmurs Musculoskeletal: s/p L BKA Skin:  no rash/petichiae Vascular:  Normal pulses all extremities   Neurologic Exam Mental Status: Awake and fully alert.  Mild dysarthria (chronic).  Oriented to place and time. Recent and remote memory intact. Attention span, concentration and fund of knowledge appropriate. Mood and affect appropriate.  Cranial Nerves: Fundoscopic exam reveals sharp disc margins. Pupils equal, briskly reactive to light. Extraocular movements full without nystagmus. Visual fields full to confrontation. Hearing intact. Facial sensation intact. Face, tongue, palate moves normally and symmetrically.  Motor: Normal bulk and tone. Normal strength in all tested extremity muscles, full strength LLE HF Sensory.: intact to touch , pinprick , position and vibratory sensation.  Coordination: Rapid alternating movements normal in all extremities.  Gait and Station: Arises from chair without difficulty. Stance is normal.  Mild gait unsteadiness 2/2 L BKA with use of prosthetic and cane.  Tandem walking heel toe not attempted Reflexes: 1+ and symmetric. Toes downgoing.     NIHSS  1 Modified Rankin  1      ASSESSMENT: KUSH FARABEE is  a 53 y.o. 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 Vascular risk factors include CHF, CAD/MI s/p CABG, LV thrombus, hx of LV aneurysm, hx of stroke 06/2018 s/p ILR, DM, HLD and PVD s/p L BKA.      PLAN:  Right MCA infarcts:  Hx of prior stroke: Residual deficit: Mild dysarthria (chronic from prior stroke).  Continue aspirin 81mg  daily and Eliquis 5mg  twice daily and restart rosuvastatin (Crestor) for secondary stroke prevention managed/prescribed by PCP/cardiology.  S/p ILR 2019-2022 which did not show evidence of A-fib Recommend completion of hypercoagulable labs in setting of recurrent embolic strokes but will need to wait until he is off Eliquis, he was advised to contact office once off DOAC and will place orders for labs to be completed, he and his sister verbalized understanding Encouraged routine monitoring of blood pressure at home especially when feeling dizzy/lightheaded, he was encouraged to further discuss symptoms with PCP and/or cardiology.  He is currently prescribed metoprolol for HTN, advised to further discuss need of medication with PCP, BP slightly elevated today but unclear if this is just due to office visit as he does report BP typically elevated at appointments. Discussed secondary stroke prevention measures and importance of close PCP follow up for aggressive stroke risk factor management including BP goal<130/90, HLD with LDL goal<70 and DM with A1c.<7 .  Stroke labs 12/2023: LDL 105, A1c 7.7 I have gone over the pathophysiology of stroke, warning signs and symptoms, risk factors and their management in some detail with instructions to go to the closest emergency room for symptoms of concern.  LV thrombus: Advised to contact [facility] to schedule follow-up visit with Dr. Duke Salvia (office has been trying to schedule but unable to contact). Will need repeat echo with  cardiology around 03/2024 and will further decide anticoagulation at that time.      Doing well from stroke standpoint without further recommendations and risk factors are managed by PCP.  He will follow-up as advised above regarding repeat lab work but otherwise can follow-up in office as needed. Both patient and sister agreed with plan.  He was advised to call in the future with any stroke questions or concerns or need for follow-up visit   CC:  [facility] provider: Dr. Pearlean Brownie PCP: Jettie Pagan, NP     I spent 58 minutes of face-to-face and non-face-to-face time with patient and sister.  This included previsit chart review including extensive review of hospitalization, lab review, study review, order entry, electronic health record documentation, patient education and discussion regarding above diagnoses and treatment plan and answered all other questions to patient and sisters satisfaction  Ihor Austin, [facility]  [facility] [address] [place], [place] [address]  Phone [phone] Fax [phone] Note: This document was prepared with digital dictation and possible smart phrase technology. Any transcriptional errors that result from this process are unintentional.

## 2024-02-16 ENCOUNTER — Ambulatory Visit: Attending: Cardiovascular Disease | Admitting: Cardiovascular Disease

## 2024-02-16 ENCOUNTER — Encounter: Payer: Self-pay | Admitting: Cardiovascular Disease

## 2024-02-16 VITALS — BP 116/64 | HR 73 | Ht 74.0 in | Wt 277.0 lb

## 2024-02-16 DIAGNOSIS — I2510 Atherosclerotic heart disease of native coronary artery without angina pectoris: Secondary | ICD-10-CM | POA: Diagnosis not present

## 2024-02-16 DIAGNOSIS — I10 Essential (primary) hypertension: Secondary | ICD-10-CM | POA: Diagnosis not present

## 2024-02-16 DIAGNOSIS — I5043 Acute on chronic combined systolic (congestive) and diastolic (congestive) heart failure: Secondary | ICD-10-CM | POA: Diagnosis not present

## 2024-02-16 DIAGNOSIS — I639 Cerebral infarction, unspecified: Secondary | ICD-10-CM

## 2024-02-16 DIAGNOSIS — Z89512 Acquired absence of left leg below knee: Secondary | ICD-10-CM

## 2024-02-16 DIAGNOSIS — E782 Mixed hyperlipidemia: Secondary | ICD-10-CM

## 2024-02-16 MED ORDER — ROSUVASTATIN CALCIUM 40 MG PO TABS: 40.0000 mg | ORAL_TABLET | Freq: Every day | ORAL | 3 refills | Status: AC

## 2024-02-16 MED ORDER — METOPROLOL SUCCINATE ER 25 MG PO TB24: 25.0000 mg | ORAL_TABLET | Freq: Every day | ORAL | 3 refills | Status: AC

## 2024-02-16 MED ORDER — APIXABAN 5 MG PO TABS: 5.0000 mg | ORAL_TABLET | Freq: Two times a day (BID) | ORAL | 3 refills | Status: AC

## 2024-02-16 MED ORDER — ASPIRIN 81 MG PO TBEC: 81.0000 mg | DELAYED_RELEASE_TABLET | Freq: Every day | ORAL | 3 refills | Status: AC

## 2024-02-16 NOTE — Assessment & Plan Note (Signed)
 History of systolic and diastolic heart failure.  Last echo performed 01/02/2024 revealed an EF of 45 to 50% with indeterminate diastolic parameters and otherwise normal valvular function.  He is not on GDMT.  I am going to start him back on a low-dose beta-blocker.  His blood pressure is rather soft precluding use of an ACE/ARB or Entresto.

## 2024-02-16 NOTE — Assessment & Plan Note (Addendum)
 History of CAD status post CABG x 3 in 2018 with most likely ischemic cardiomyopathy.  He did have a coronary CTA performed 01/03/2024 that revealed a patent LIMA to the LAD, vein to the PDA and vein to the circumflex coronary artery.  It also demonstrated a apical mural thrombus measuring 21 x 15 mm.

## 2024-02-16 NOTE — Assessment & Plan Note (Signed)
 Of hyperlipidemia on rosuvastatin which she has not taken since discharge from the hospital.  His last lipid profile performed 01/03/2024 revealed total cholesterol 162, LDL of 105 and HDL of 25.

## 2024-02-16 NOTE — Assessment & Plan Note (Signed)
 History of multiple amputations in the past beginning with toe amputation, then transmetatarsal amputation and ultimately a left BKA by Dr. Lajoyce Corners in 2020.  He walks with a prosthesis.

## 2024-02-16 NOTE — Patient Instructions (Signed)
 Medication Instructions:  Your physician has recommended you make the following change in your medication:   -Re-start metoprolol succinate (Toprol- XL) 25mg  once daily.  -Re-start rosuvastatin (crestor) 40mg  once daily.  *If you need a refill on your cardiac medications before your next appointment, please call your pharmacy*   Follow-Up: At [facility], you and your health needs are our priority.  As part of our continuing mission to provide you with exceptional heart care, we have created designated Provider Care Teams.  These Care Teams include your primary Cardiologist (physician) and Advanced Practice Providers (APPs -  Physician Assistants and Nurse Practitioners) who all work together to provide you with the care you need, when you need it.  We recommend signing up for the patient portal called "MyChart".  Sign up information is provided on this After Visit Summary.  MyChart is used to connect with patients for Virtual Visits (Telemedicine).  Patients are able to view lab/test results, encounter notes, upcoming appointments, etc.  Non-urgent messages can be sent to your provider as well.   To learn more about what you can do with MyChart, go to [email or web address].    Your next appointment:   3 month(s)  Provider:   Marjie Skiff, PA-C, Robet Leu, PA-C, Azalee Course, PA-C, Bernadene Person, NP, or Reather Littler, NP       Then, Nanetta Batty, MD will plan to see you again in 12 month(s).  Other Instructions   1st Floor: - Lobby - Registration  - Pharmacy  - Lab - Cafe  2nd Floor: - PV Lab - Diagnostic Testing (echo, CT, nuclear med)  3rd Floor: - Vacant  4th Floor: - TCTS (cardiothoracic surgery) - AFib Clinic - Structural Heart Clinic - Vascular Surgery  - Vascular Ultrasound  5th Floor: - [facility] (general and EP) - Clinical Pharmacy for coumadin, hypertension, lipid, weight-loss medications, and med management  appointments    Valet parking services will be available as well.

## 2024-02-16 NOTE — Assessment & Plan Note (Signed)
 History of at least 2 CVAs in the past.  He did have a loop recorder implanted but had no A-fib.  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

## 2024-02-16 NOTE — Progress Notes (Signed)
 02/16/2024 [name]   27-Sep-1971  [id number]  Primary Physician Jettie Pagan, NP Primary Cardiologist: Runell Gess MD Milagros Loll, [place], [place]  HPI:  Travis [name] is a 53 y.o. moderately overweight divorced Caucasian male father of 3 children who is currently disabled from prior strokes.  I am seeing him as a new patient.  He has a history of hypertension, hyperlipidemia and diabetes.  He has PAD status post left BKA by Dr. Lajoyce Corners in 2020.  He had CABG x 3 in 2018.  He has had several disabling strokes in the past and did have a loop recorder implanted which did not demonstrate A-fib.  He was recently admitted with a stroke in January of this year was seen by Dr. Eden Emms.  A 2D echo revealed EF of 45 to 50%.  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  He was placed on Eliquis.  GDMT was not started.  He currently denies chest pain or shortness of breath.   Current Meds  Medication Sig   apixaban (ELIQUIS) 5 MG TABS tablet Take 1 tablet (5 mg total) by mouth 2 (two) times daily.   aspirin EC 81 MG tablet Take 1 tablet (81 mg total) by mouth daily. Swallow whole.   dapagliflozin propanediol (FARXIGA) 5 MG TABS tablet Take 1 tablet (5 mg total) by mouth daily.   insulin glargine (LANTUS SOLOSTAR) 100 UNIT/ML Solostar Pen Inject 40-50 Units into the skin 2 (two) times daily.  Lantus 50 units QAM and 40 units QPM   insulin lispro (HUMALOG) 100 UNIT/ML KwikPen Inject 0-12 Units into the skin 4 (four) times daily -  with meals and at bedtime. Humalog SSI QID with meals (8-15 units first 3 meals of day, 8 units at bedtime)   Insulin Pen Needle 31G X 5 MM MISC Use as directed to inject insulin. Dx Code:e11.43   metoCLOPramide (REGLAN) 10 MG tablet Take 1 tablet (10 mg total) by mouth every 8 (eight) hours as needed for nausea. (Patient taking differently: Take 10 mg by mouth daily.)   metoprolol succinate (TOPROL-XL) 25 MG 24 hr tablet Take 0.5 tablets  (12.5 mg total) by mouth daily.   rosuvastatin (CRESTOR) 40 MG tablet TAKE 1 TABLET(40 MG) BY MOUTH DAILY     No Known Allergies  Social History   Socioeconomic History   Marital status: Divorced    Spouse name: Not on file   Number of children: 3   Years of education: HS   Highest education level: Not on file  Occupational History   Occupation: Disabled  Tobacco Use   Smoking status: Never   Smokeless tobacco: Never  Vaping Use   Vaping status: Never Used  Substance and Sexual Activity   Alcohol use: No   Drug use: No   Sexual activity: Yes  Other Topics Concern   Not on file  Social History Narrative   Lives at home with his children.   Right-handed.   No caffeine use.   Social Drivers of Health   Financial Resource Strain: High Risk (01/19/2024)   Received from [facility]   Overall Financial Resource Strain (CARDIA)    Difficulty of Paying Living Expenses: Very hard  Food Insecurity: Food Insecurity Present (01/19/2024)   Received from [facility]   Hunger Vital Sign    Worried About Running Out of Food in the Last Year: Sometimes true    Ran Out of Food in the Last  Year: Sometimes true  Transportation Needs: No Transportation Needs (01/19/2024)   Received from [facility] - Transportation    Lack of Transportation (Medical): No    Lack of Transportation (Non-Medical): No  Physical Activity: Unknown (06/13/2023)   Received from [facility]   Exercise Vital Sign    Days of Exercise per Week: 0 days    Minutes of Exercise per Session: Not on file  Stress: Stress Concern Present (06/13/2023)   Received from [facility] - Occupational Stress Questionnaire    Feeling of Stress : Very much  Social Connections: Socially Isolated (06/13/2023)   Received from [facility]   Social Network    How would you rate your social network (family, work, friends)?: Little participation, lonely and socially isolated   Intimate Partner Violence: Not At Risk (01/02/2024)   Humiliation, Afraid, Rape, and Kick questionnaire    Fear of Current or Ex-Partner: No    Emotionally Abused: No    Physically Abused: No    Sexually Abused: No     Review of Systems: General: negative for chills, fever, night sweats or weight changes.  Cardiovascular: negative for chest pain, dyspnea on exertion, edema, orthopnea, palpitations, paroxysmal nocturnal dyspnea or shortness of breath Dermatological: negative for rash Respiratory: negative for cough or wheezing Urologic: negative for hematuria Abdominal: negative for nausea, vomiting, diarrhea, bright red blood per rectum, melena, or hematemesis Neurologic: negative for visual changes, syncope, or dizziness All other systems reviewed and are otherwise negative except as noted above.    Blood pressure 116/64, pulse 73, height 6\' 2"  (1.88 m), weight 277 lb (125.6 kg), SpO2 98%.  General appearance: alert and no distress Neck: no adenopathy, no carotid bruit, no JVD, supple, symmetrical, trachea midline, and thyroid not enlarged, symmetric, no tenderness/mass/nodules Lungs: clear to auscultation bilaterally Heart: regular rate and rhythm, S1, S2 normal, no murmur, click, rub or gallop Extremities: Left BKA Pulses: Left BKA, absent pedal pulse Skin: Skin color, texture, turgor normal. No rashes or lesions Neurologic: Grossly normal  EKG EKG Interpretation Date/Time:  Thursday February 16 2024 13:53:53 EST Ventricular Rate:  73 PR Interval:  170 QRS Duration:  120 QT Interval:  422 QTC Calculation: 464 R Axis:   98  Text Interpretation: Normal sinus rhythm Anterolateral infarct , age undetermined When compared with ECG of 02-Jan-2024 08:22, PREVIOUS ECG IS PRESENT Confirmed by Nanetta Batty [id number]) on 02/16/2024 2:03:27 PM    ASSESSMENT AND PLAN:   S/P BKA (below knee amputation) unilateral, left (HCC) History of multiple amputations in the past beginning with toe  amputation, then transmetatarsal amputation and ultimately a left BKA by Dr. Lajoyce Corners in 2020.  He walks with a prosthesis.  Acute on chronic systolic and diastolic heart failure, NYHA class 3 (HCC) History of systolic and diastolic heart failure.  Last echo performed 01/02/2024 revealed an EF of 45 to 50% with indeterminate diastolic parameters and otherwise normal valvular function.  He is not on GDMT.  I am going to start him back on a low-dose beta-blocker.  His blood pressure is rather soft precluding use of an ACE/ARB or Entresto.  Essential hypertension History of essential hypertension her blood pressure measured today at 116/64.  He currently is not taking any antihypertensive medications.  Given his mild to moderate LV dysfunction I am going to start him on low-dose Toprol.  Hyperlipidemia Of hyperlipidemia on rosuvastatin which she has not taken since discharge from the hospital.  His last lipid profile performed 01/03/2024 revealed total cholesterol 162, LDL of 105 and HDL of 25.  Acute CVA (cerebrovascular accident) (HCC) History of at least 2 CVAs in the past.  He did have a loop recorder implanted but had no A-fib.  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     Runell Gess MD FACP,FACC,FAHA, [facility] 02/16/2024 2:22 PM

## 2024-02-16 NOTE — Assessment & Plan Note (Signed)
 History of essential hypertension her blood pressure measured today at 116/64.  He currently is not taking any antihypertensive medications.  Given his mild to moderate LV dysfunction I am going to start him on low-dose Toprol.

## 2024-02-17 NOTE — Progress Notes (Signed)
 I agree with the above plan

## 2024-05-23 ENCOUNTER — Ambulatory Visit: Admitting: Physician Assistant

## 2024-07-13 ENCOUNTER — Ambulatory Visit: Attending: Physician Assistant | Admitting: Physician Assistant

## 2024-07-15 NOTE — Progress Notes (Signed)
 This encounter was created in error - please disregard.
